# Patient Record
Sex: Female | Born: 1950 | Race: White | Hispanic: No | Smoking: Never smoker
Health system: Southern US, Community
[De-identification: ages and names within clinical notes are randomized; demographics above are authoritative.]

## PROBLEM LIST (undated history)

## (undated) DIAGNOSIS — F32A Depression, unspecified: Secondary | ICD-10-CM

## (undated) DIAGNOSIS — Z87442 Personal history of urinary calculi: Secondary | ICD-10-CM

## (undated) DIAGNOSIS — E669 Obesity, unspecified: Secondary | ICD-10-CM

## (undated) DIAGNOSIS — C801 Malignant (primary) neoplasm, unspecified: Secondary | ICD-10-CM

## (undated) DIAGNOSIS — I272 Pulmonary hypertension, unspecified: Secondary | ICD-10-CM

## (undated) DIAGNOSIS — Z8719 Personal history of other diseases of the digestive system: Secondary | ICD-10-CM

## (undated) DIAGNOSIS — I10 Essential (primary) hypertension: Secondary | ICD-10-CM

## (undated) DIAGNOSIS — I499 Cardiac arrhythmia, unspecified: Secondary | ICD-10-CM

## (undated) DIAGNOSIS — K219 Gastro-esophageal reflux disease without esophagitis: Secondary | ICD-10-CM

## (undated) DIAGNOSIS — M5416 Radiculopathy, lumbar region: Secondary | ICD-10-CM

## (undated) DIAGNOSIS — G4733 Obstructive sleep apnea (adult) (pediatric): Secondary | ICD-10-CM

## (undated) DIAGNOSIS — D126 Benign neoplasm of colon, unspecified: Secondary | ICD-10-CM

## (undated) DIAGNOSIS — R011 Cardiac murmur, unspecified: Secondary | ICD-10-CM

## (undated) DIAGNOSIS — H9191 Unspecified hearing loss, right ear: Secondary | ICD-10-CM

## (undated) DIAGNOSIS — R0600 Dyspnea, unspecified: Secondary | ICD-10-CM

## (undated) DIAGNOSIS — I82409 Acute embolism and thrombosis of unspecified deep veins of unspecified lower extremity: Secondary | ICD-10-CM

## (undated) DIAGNOSIS — Z86010 Personal history of colonic polyps: Secondary | ICD-10-CM

## (undated) DIAGNOSIS — F419 Anxiety disorder, unspecified: Secondary | ICD-10-CM

## (undated) DIAGNOSIS — K648 Other hemorrhoids: Secondary | ICD-10-CM

## (undated) DIAGNOSIS — K5790 Diverticulosis of intestine, part unspecified, without perforation or abscess without bleeding: Secondary | ICD-10-CM

## (undated) DIAGNOSIS — K589 Irritable bowel syndrome without diarrhea: Secondary | ICD-10-CM

## (undated) DIAGNOSIS — F329 Major depressive disorder, single episode, unspecified: Secondary | ICD-10-CM

## (undated) DIAGNOSIS — M797 Fibromyalgia: Secondary | ICD-10-CM

## (undated) DIAGNOSIS — I5032 Chronic diastolic (congestive) heart failure: Secondary | ICD-10-CM

## (undated) DIAGNOSIS — M1990 Unspecified osteoarthritis, unspecified site: Secondary | ICD-10-CM

## (undated) DIAGNOSIS — M5030 Other cervical disc degeneration, unspecified cervical region: Secondary | ICD-10-CM

## (undated) DIAGNOSIS — R51 Headache: Secondary | ICD-10-CM

## (undated) DIAGNOSIS — R55 Syncope and collapse: Secondary | ICD-10-CM

## (undated) HISTORY — PX: ABDOMINAL HYSTERECTOMY: SHX81

## (undated) HISTORY — PX: JOINT REPLACEMENT: SHX530

## (undated) HISTORY — DX: Benign neoplasm of colon, unspecified: D12.6

## (undated) HISTORY — DX: Personal history of colonic polyps: Z86.010

## (undated) HISTORY — DX: Other cervical disc degeneration, unspecified cervical region: M50.30

## (undated) HISTORY — DX: Radiculopathy, lumbar region: M54.16

## (undated) HISTORY — DX: Gastro-esophageal reflux disease without esophagitis: K21.9

## (undated) HISTORY — PX: WRIST FRACTURE SURGERY: SHX121

## (undated) HISTORY — DX: Essential (primary) hypertension: I10

## (undated) HISTORY — DX: Irritable bowel syndrome, unspecified: K58.9

## (undated) HISTORY — DX: Obstructive sleep apnea (adult) (pediatric): G47.33

## (undated) HISTORY — DX: Fibromyalgia: M79.7

## (undated) HISTORY — PX: UPPER GASTROINTESTINAL ENDOSCOPY: SHX188

## (undated) HISTORY — DX: Unspecified osteoarthritis, unspecified site: M19.90

## (undated) HISTORY — DX: Obesity, unspecified: E66.9

## (undated) HISTORY — PX: COLONOSCOPY: SHX5424

## (undated) HISTORY — DX: Diverticulosis of intestine, part unspecified, without perforation or abscess without bleeding: K57.90

## (undated) HISTORY — DX: Depression, unspecified: F32.A

## (undated) HISTORY — PX: APPENDECTOMY: SHX54

## (undated) HISTORY — PX: BREAST EXCISIONAL BIOPSY: SUR124

## (undated) HISTORY — PX: EYE SURGERY: SHX253

## (undated) HISTORY — DX: Other hemorrhoids: K64.8

## (undated) HISTORY — PX: CHOLECYSTECTOMY: SHX55

## (undated) HISTORY — DX: Anxiety disorder, unspecified: F41.9

## (undated) HISTORY — DX: Syncope and collapse: R55

## (undated) HISTORY — DX: Major depressive disorder, single episode, unspecified: F32.9

## (undated) HISTORY — DX: Depression, unspecified: F41.9

---

## 1998-08-06 ENCOUNTER — Encounter: Payer: Self-pay | Admitting: Emergency Medicine

## 1998-08-06 ENCOUNTER — Inpatient Hospital Stay (HOSPITAL_COMMUNITY): Admission: EM | Admit: 1998-08-06 | Discharge: 1998-08-08 | Payer: Self-pay | Admitting: Emergency Medicine

## 1998-09-05 ENCOUNTER — Encounter: Payer: Self-pay | Admitting: Neurology

## 1998-09-05 ENCOUNTER — Ambulatory Visit (HOSPITAL_COMMUNITY): Admission: RE | Admit: 1998-09-05 | Discharge: 1998-09-05 | Payer: Self-pay | Admitting: Neurology

## 1998-11-01 ENCOUNTER — Encounter: Payer: Self-pay | Admitting: Neurology

## 1998-11-01 ENCOUNTER — Emergency Department (HOSPITAL_COMMUNITY): Admission: EM | Admit: 1998-11-01 | Discharge: 1998-11-01 | Payer: Self-pay | Admitting: Emergency Medicine

## 1998-11-03 ENCOUNTER — Encounter: Payer: Self-pay | Admitting: Neurology

## 1998-11-03 ENCOUNTER — Ambulatory Visit (HOSPITAL_COMMUNITY): Admission: RE | Admit: 1998-11-03 | Discharge: 1998-11-03 | Payer: Self-pay | Admitting: Neurology

## 1998-11-04 ENCOUNTER — Encounter: Payer: Self-pay | Admitting: Neurology

## 1998-11-04 ENCOUNTER — Ambulatory Visit (HOSPITAL_COMMUNITY): Admission: RE | Admit: 1998-11-04 | Discharge: 1998-11-04 | Payer: Self-pay | Admitting: Neurology

## 1998-12-21 ENCOUNTER — Ambulatory Visit (HOSPITAL_COMMUNITY): Admission: RE | Admit: 1998-12-21 | Discharge: 1998-12-21 | Payer: Self-pay

## 1999-04-29 ENCOUNTER — Other Ambulatory Visit: Admission: RE | Admit: 1999-04-29 | Discharge: 1999-04-29 | Payer: Self-pay | Admitting: Obstetrics and Gynecology

## 1999-10-25 ENCOUNTER — Encounter: Admission: RE | Admit: 1999-10-25 | Discharge: 1999-10-25 | Payer: Self-pay

## 2000-05-08 ENCOUNTER — Other Ambulatory Visit: Admission: RE | Admit: 2000-05-08 | Discharge: 2000-05-08 | Payer: Self-pay | Admitting: Obstetrics and Gynecology

## 2000-10-01 ENCOUNTER — Encounter: Admission: RE | Admit: 2000-10-01 | Discharge: 2000-10-01 | Payer: Self-pay

## 2001-05-14 ENCOUNTER — Other Ambulatory Visit: Admission: RE | Admit: 2001-05-14 | Discharge: 2001-05-14 | Payer: Self-pay | Admitting: Obstetrics and Gynecology

## 2001-10-30 ENCOUNTER — Encounter: Admission: RE | Admit: 2001-10-30 | Discharge: 2001-10-30 | Payer: Self-pay | Admitting: Obstetrics and Gynecology

## 2001-10-30 ENCOUNTER — Encounter: Payer: Self-pay | Admitting: Obstetrics and Gynecology

## 2002-02-18 ENCOUNTER — Encounter: Payer: Self-pay | Admitting: Surgery

## 2002-02-18 ENCOUNTER — Encounter: Admission: RE | Admit: 2002-02-18 | Discharge: 2002-02-18 | Payer: Self-pay | Admitting: Surgery

## 2002-05-27 ENCOUNTER — Encounter: Payer: Self-pay | Admitting: Internal Medicine

## 2002-10-03 ENCOUNTER — Encounter: Payer: Self-pay | Admitting: Surgery

## 2002-10-03 ENCOUNTER — Encounter: Admission: RE | Admit: 2002-10-03 | Discharge: 2002-10-03 | Payer: Self-pay | Admitting: Surgery

## 2003-03-03 ENCOUNTER — Encounter: Admission: RE | Admit: 2003-03-03 | Discharge: 2003-03-03 | Payer: Self-pay | Admitting: Surgery

## 2003-03-03 ENCOUNTER — Encounter: Payer: Self-pay | Admitting: Surgery

## 2003-06-01 ENCOUNTER — Emergency Department (HOSPITAL_COMMUNITY): Admission: EM | Admit: 2003-06-01 | Discharge: 2003-06-01 | Payer: Self-pay | Admitting: Emergency Medicine

## 2003-06-01 ENCOUNTER — Encounter: Payer: Self-pay | Admitting: Emergency Medicine

## 2003-07-29 ENCOUNTER — Encounter: Admission: RE | Admit: 2003-07-29 | Discharge: 2003-07-29 | Payer: Self-pay | Admitting: Obstetrics and Gynecology

## 2003-10-06 ENCOUNTER — Encounter: Admission: RE | Admit: 2003-10-06 | Discharge: 2003-10-06 | Payer: Self-pay | Admitting: Surgery

## 2004-08-04 ENCOUNTER — Encounter: Admission: RE | Admit: 2004-08-04 | Discharge: 2004-08-04 | Payer: Self-pay | Admitting: Orthopedic Surgery

## 2004-08-09 ENCOUNTER — Ambulatory Visit (HOSPITAL_COMMUNITY): Admission: RE | Admit: 2004-08-09 | Discharge: 2004-08-09 | Payer: Self-pay | Admitting: Orthopedic Surgery

## 2004-08-23 ENCOUNTER — Ambulatory Visit: Payer: Self-pay | Admitting: Internal Medicine

## 2004-09-02 ENCOUNTER — Observation Stay (HOSPITAL_COMMUNITY): Admission: AD | Admit: 2004-09-02 | Discharge: 2004-09-03 | Payer: Self-pay | Admitting: Orthopedic Surgery

## 2004-09-05 ENCOUNTER — Ambulatory Visit: Payer: Self-pay | Admitting: Internal Medicine

## 2004-09-09 ENCOUNTER — Inpatient Hospital Stay (HOSPITAL_COMMUNITY): Admission: EM | Admit: 2004-09-09 | Discharge: 2004-09-12 | Payer: Self-pay | Admitting: Emergency Medicine

## 2004-09-09 ENCOUNTER — Ambulatory Visit: Payer: Self-pay | Admitting: Internal Medicine

## 2004-09-15 ENCOUNTER — Ambulatory Visit: Payer: Self-pay | Admitting: Internal Medicine

## 2004-09-16 ENCOUNTER — Ambulatory Visit (HOSPITAL_COMMUNITY): Admission: RE | Admit: 2004-09-16 | Discharge: 2004-09-16 | Payer: Self-pay | Admitting: Orthopedic Surgery

## 2004-09-22 ENCOUNTER — Ambulatory Visit: Payer: Self-pay | Admitting: Internal Medicine

## 2004-10-04 ENCOUNTER — Ambulatory Visit: Payer: Self-pay | Admitting: Internal Medicine

## 2004-10-18 ENCOUNTER — Ambulatory Visit: Payer: Self-pay | Admitting: Internal Medicine

## 2004-10-30 ENCOUNTER — Encounter: Admission: RE | Admit: 2004-10-30 | Discharge: 2004-10-30 | Payer: Self-pay | Admitting: Orthopedic Surgery

## 2004-11-01 ENCOUNTER — Ambulatory Visit: Payer: Self-pay | Admitting: Internal Medicine

## 2004-11-22 ENCOUNTER — Ambulatory Visit: Payer: Self-pay | Admitting: Internal Medicine

## 2004-12-13 ENCOUNTER — Ambulatory Visit: Payer: Self-pay | Admitting: Internal Medicine

## 2004-12-27 ENCOUNTER — Ambulatory Visit: Payer: Self-pay | Admitting: Internal Medicine

## 2005-01-17 ENCOUNTER — Ambulatory Visit: Payer: Self-pay | Admitting: Internal Medicine

## 2005-01-24 ENCOUNTER — Other Ambulatory Visit: Admission: RE | Admit: 2005-01-24 | Discharge: 2005-01-24 | Payer: Self-pay | Admitting: Obstetrics and Gynecology

## 2005-02-03 ENCOUNTER — Encounter: Admission: RE | Admit: 2005-02-03 | Discharge: 2005-02-03 | Payer: Self-pay | Admitting: Surgery

## 2005-02-07 ENCOUNTER — Ambulatory Visit: Payer: Self-pay | Admitting: Internal Medicine

## 2005-02-14 ENCOUNTER — Encounter: Admission: RE | Admit: 2005-02-14 | Discharge: 2005-02-14 | Payer: Self-pay | Admitting: Surgery

## 2005-05-18 ENCOUNTER — Ambulatory Visit (HOSPITAL_COMMUNITY): Admission: RE | Admit: 2005-05-18 | Discharge: 2005-05-18 | Payer: Self-pay | Admitting: Obstetrics and Gynecology

## 2005-07-03 ENCOUNTER — Emergency Department (HOSPITAL_COMMUNITY): Admission: EM | Admit: 2005-07-03 | Discharge: 2005-07-03 | Payer: Self-pay | Admitting: Family Medicine

## 2005-11-14 ENCOUNTER — Ambulatory Visit (HOSPITAL_COMMUNITY): Admission: RE | Admit: 2005-11-14 | Discharge: 2005-11-14 | Payer: Self-pay | Admitting: Orthopedic Surgery

## 2005-11-26 ENCOUNTER — Emergency Department (HOSPITAL_COMMUNITY): Admission: EM | Admit: 2005-11-26 | Discharge: 2005-11-27 | Payer: Self-pay | Admitting: Emergency Medicine

## 2005-11-27 ENCOUNTER — Ambulatory Visit (HOSPITAL_COMMUNITY): Admission: RE | Admit: 2005-11-27 | Discharge: 2005-11-27 | Payer: Self-pay | Admitting: Emergency Medicine

## 2006-01-29 ENCOUNTER — Ambulatory Visit: Payer: Self-pay | Admitting: Internal Medicine

## 2006-02-23 ENCOUNTER — Encounter: Admission: RE | Admit: 2006-02-23 | Discharge: 2006-02-23 | Payer: Self-pay | Admitting: Surgery

## 2006-04-19 ENCOUNTER — Ambulatory Visit: Payer: Self-pay | Admitting: Internal Medicine

## 2006-07-23 ENCOUNTER — Ambulatory Visit: Payer: Self-pay | Admitting: Internal Medicine

## 2006-07-23 LAB — CONVERTED CEMR LAB: Hgb A1c MFr Bld: 5.3 % (ref 4.6–6.0)

## 2006-09-18 HISTORY — PX: KNEE ARTHROSCOPY: SUR90

## 2006-10-24 ENCOUNTER — Emergency Department (HOSPITAL_COMMUNITY): Admission: EM | Admit: 2006-10-24 | Discharge: 2006-10-24 | Payer: Self-pay | Admitting: Emergency Medicine

## 2006-10-31 ENCOUNTER — Ambulatory Visit: Payer: Self-pay | Admitting: Internal Medicine

## 2006-11-13 ENCOUNTER — Ambulatory Visit: Payer: Self-pay | Admitting: Internal Medicine

## 2007-03-04 ENCOUNTER — Ambulatory Visit: Payer: Self-pay | Admitting: Internal Medicine

## 2007-03-14 ENCOUNTER — Ambulatory Visit: Payer: Self-pay | Admitting: Internal Medicine

## 2007-04-09 ENCOUNTER — Encounter: Admission: RE | Admit: 2007-04-09 | Discharge: 2007-04-09 | Payer: Self-pay | Admitting: Obstetrics and Gynecology

## 2007-04-09 ENCOUNTER — Encounter: Payer: Self-pay | Admitting: Internal Medicine

## 2007-04-29 ENCOUNTER — Telehealth (INDEPENDENT_AMBULATORY_CARE_PROVIDER_SITE_OTHER): Payer: Self-pay | Admitting: *Deleted

## 2007-04-30 ENCOUNTER — Ambulatory Visit: Payer: Self-pay | Admitting: Internal Medicine

## 2007-04-30 ENCOUNTER — Telehealth: Payer: Self-pay | Admitting: Internal Medicine

## 2007-07-23 ENCOUNTER — Ambulatory Visit: Payer: Self-pay | Admitting: Internal Medicine

## 2007-07-23 DIAGNOSIS — K589 Irritable bowel syndrome without diarrhea: Secondary | ICD-10-CM | POA: Insufficient documentation

## 2007-07-23 DIAGNOSIS — I80299 Phlebitis and thrombophlebitis of other deep vessels of unspecified lower extremity: Secondary | ICD-10-CM | POA: Insufficient documentation

## 2007-07-23 DIAGNOSIS — IMO0001 Reserved for inherently not codable concepts without codable children: Secondary | ICD-10-CM

## 2007-07-23 DIAGNOSIS — M797 Fibromyalgia: Secondary | ICD-10-CM | POA: Insufficient documentation

## 2007-07-23 DIAGNOSIS — J328 Other chronic sinusitis: Secondary | ICD-10-CM | POA: Insufficient documentation

## 2007-07-30 ENCOUNTER — Telehealth (INDEPENDENT_AMBULATORY_CARE_PROVIDER_SITE_OTHER): Payer: Self-pay | Admitting: *Deleted

## 2007-07-31 ENCOUNTER — Telehealth (INDEPENDENT_AMBULATORY_CARE_PROVIDER_SITE_OTHER): Payer: Self-pay | Admitting: *Deleted

## 2007-08-20 ENCOUNTER — Ambulatory Visit: Payer: Self-pay | Admitting: Cardiology

## 2007-09-02 ENCOUNTER — Ambulatory Visit: Payer: Self-pay

## 2007-09-02 ENCOUNTER — Encounter: Payer: Self-pay | Admitting: Cardiology

## 2007-10-15 ENCOUNTER — Ambulatory Visit: Payer: Self-pay | Admitting: Internal Medicine

## 2007-10-15 DIAGNOSIS — IMO0002 Reserved for concepts with insufficient information to code with codable children: Secondary | ICD-10-CM | POA: Insufficient documentation

## 2007-10-15 DIAGNOSIS — R5383 Other fatigue: Secondary | ICD-10-CM

## 2007-10-15 DIAGNOSIS — I10 Essential (primary) hypertension: Secondary | ICD-10-CM | POA: Insufficient documentation

## 2007-10-15 DIAGNOSIS — R5381 Other malaise: Secondary | ICD-10-CM | POA: Insufficient documentation

## 2007-10-19 LAB — CONVERTED CEMR LAB
Basophils Absolute: 0 10*3/uL (ref 0.0–0.1)
Basophils Relative: 0.2 % (ref 0.0–1.0)
Eosinophils Absolute: 0.1 10*3/uL (ref 0.0–0.6)
Eosinophils Relative: 3 % (ref 0.0–5.0)
Free T4: 0.6 ng/dL (ref 0.6–1.6)
HCT: 39.7 % (ref 36.0–46.0)
Hemoglobin: 13.3 g/dL (ref 12.0–15.0)
Lymphocytes Relative: 31.5 % (ref 12.0–46.0)
MCHC: 33.5 g/dL (ref 30.0–36.0)
MCV: 86.8 fL (ref 78.0–100.0)
Monocytes Absolute: 0.4 10*3/uL (ref 0.2–0.7)
Monocytes Relative: 7.9 % (ref 3.0–11.0)
Neutro Abs: 2.9 10*3/uL (ref 1.4–7.7)
Neutrophils Relative %: 57.4 % (ref 43.0–77.0)
Platelets: 252 10*3/uL (ref 150–400)
RBC: 4.57 M/uL (ref 3.87–5.11)
RDW: 13.3 % (ref 11.5–14.6)
TSH: 1.28 microintl units/mL (ref 0.35–5.50)
WBC: 5 10*3/uL (ref 4.5–10.5)

## 2007-10-21 ENCOUNTER — Encounter (INDEPENDENT_AMBULATORY_CARE_PROVIDER_SITE_OTHER): Payer: Self-pay | Admitting: *Deleted

## 2007-10-24 ENCOUNTER — Encounter: Payer: Self-pay | Admitting: Internal Medicine

## 2007-12-10 ENCOUNTER — Telehealth (INDEPENDENT_AMBULATORY_CARE_PROVIDER_SITE_OTHER): Payer: Self-pay | Admitting: *Deleted

## 2007-12-12 ENCOUNTER — Ambulatory Visit: Payer: Self-pay | Admitting: Internal Medicine

## 2007-12-13 ENCOUNTER — Ambulatory Visit: Payer: Self-pay | Admitting: Internal Medicine

## 2007-12-13 ENCOUNTER — Telehealth (INDEPENDENT_AMBULATORY_CARE_PROVIDER_SITE_OTHER): Payer: Self-pay | Admitting: *Deleted

## 2007-12-13 LAB — CONVERTED CEMR LAB: Potassium: 3.6 meq/L (ref 3.5–5.1)

## 2007-12-16 ENCOUNTER — Encounter (INDEPENDENT_AMBULATORY_CARE_PROVIDER_SITE_OTHER): Payer: Self-pay | Admitting: *Deleted

## 2007-12-16 LAB — CONVERTED CEMR LAB
BUN: 12 mg/dL (ref 6–23)
Creatinine, Ser: 0.8 mg/dL (ref 0.4–1.2)
Potassium: 8.9 meq/L (ref 3.5–5.1)

## 2008-04-13 ENCOUNTER — Encounter: Admission: RE | Admit: 2008-04-13 | Discharge: 2008-04-13 | Payer: Self-pay | Admitting: Surgery

## 2008-04-20 ENCOUNTER — Emergency Department (HOSPITAL_COMMUNITY): Admission: EM | Admit: 2008-04-20 | Discharge: 2008-04-21 | Payer: Self-pay | Admitting: Emergency Medicine

## 2008-04-21 ENCOUNTER — Telehealth: Payer: Self-pay | Admitting: Internal Medicine

## 2008-04-21 DIAGNOSIS — R519 Headache, unspecified: Secondary | ICD-10-CM | POA: Insufficient documentation

## 2008-04-21 DIAGNOSIS — R51 Headache: Secondary | ICD-10-CM | POA: Insufficient documentation

## 2008-06-11 ENCOUNTER — Encounter: Payer: Self-pay | Admitting: Internal Medicine

## 2008-06-12 ENCOUNTER — Encounter: Payer: Self-pay | Admitting: Internal Medicine

## 2008-07-16 ENCOUNTER — Ambulatory Visit: Payer: Self-pay | Admitting: Internal Medicine

## 2008-07-27 ENCOUNTER — Telehealth (INDEPENDENT_AMBULATORY_CARE_PROVIDER_SITE_OTHER): Payer: Self-pay | Admitting: *Deleted

## 2008-08-12 ENCOUNTER — Encounter: Payer: Self-pay | Admitting: Internal Medicine

## 2008-12-28 DIAGNOSIS — I38 Endocarditis, valve unspecified: Secondary | ICD-10-CM | POA: Insufficient documentation

## 2009-02-24 ENCOUNTER — Telehealth (INDEPENDENT_AMBULATORY_CARE_PROVIDER_SITE_OTHER): Payer: Self-pay | Admitting: *Deleted

## 2009-02-24 ENCOUNTER — Encounter: Payer: Self-pay | Admitting: Internal Medicine

## 2009-02-25 ENCOUNTER — Ambulatory Visit: Payer: Self-pay | Admitting: Internal Medicine

## 2009-02-25 DIAGNOSIS — K219 Gastro-esophageal reflux disease without esophagitis: Secondary | ICD-10-CM | POA: Insufficient documentation

## 2009-03-01 ENCOUNTER — Telehealth (INDEPENDENT_AMBULATORY_CARE_PROVIDER_SITE_OTHER): Payer: Self-pay | Admitting: *Deleted

## 2009-03-03 ENCOUNTER — Ambulatory Visit: Payer: Self-pay | Admitting: Internal Medicine

## 2009-03-03 LAB — CONVERTED CEMR LAB: H Pylori IgG: NEGATIVE

## 2009-03-05 ENCOUNTER — Encounter (INDEPENDENT_AMBULATORY_CARE_PROVIDER_SITE_OTHER): Payer: Self-pay | Admitting: *Deleted

## 2009-03-05 LAB — CONVERTED CEMR LAB
Basophils Absolute: 0 10*3/uL (ref 0.0–0.1)
Basophils Relative: 0 % (ref 0.0–3.0)
Eosinophils Absolute: 0.2 10*3/uL (ref 0.0–0.7)
Eosinophils Relative: 3.8 % (ref 0.0–5.0)
H Pylori IgG: NEGATIVE
HCT: 40.6 % (ref 36.0–46.0)
Hemoglobin: 14 g/dL (ref 12.0–15.0)
Lymphocytes Relative: 28.3 % (ref 12.0–46.0)
Lymphs Abs: 1.3 10*3/uL (ref 0.7–4.0)
MCHC: 34.5 g/dL (ref 30.0–36.0)
MCV: 90.3 fL (ref 78.0–100.0)
Monocytes Absolute: 0.2 10*3/uL (ref 0.1–1.0)
Monocytes Relative: 4.6 % (ref 3.0–12.0)
Neutro Abs: 3 10*3/uL (ref 1.4–7.7)
Neutrophils Relative %: 63.3 % (ref 43.0–77.0)
Platelets: 198 10*3/uL (ref 150.0–400.0)
RBC: 4.49 M/uL (ref 3.87–5.11)
RDW: 13.3 % (ref 11.5–14.6)
TSH: 0.64 microintl units/mL (ref 0.35–5.50)
WBC: 4.7 10*3/uL (ref 4.5–10.5)

## 2009-03-29 ENCOUNTER — Ambulatory Visit: Payer: Self-pay | Admitting: Internal Medicine

## 2009-03-29 ENCOUNTER — Encounter (INDEPENDENT_AMBULATORY_CARE_PROVIDER_SITE_OTHER): Payer: Self-pay | Admitting: *Deleted

## 2009-03-29 LAB — CONVERTED CEMR LAB
OCCULT 1: NEGATIVE
OCCULT 2: NEGATIVE
OCCULT 3: NEGATIVE

## 2009-04-05 ENCOUNTER — Ambulatory Visit: Payer: Self-pay | Admitting: Internal Medicine

## 2009-04-14 ENCOUNTER — Encounter: Admission: RE | Admit: 2009-04-14 | Discharge: 2009-04-14 | Payer: Self-pay | Admitting: Obstetrics and Gynecology

## 2009-04-21 ENCOUNTER — Ambulatory Visit: Payer: Self-pay | Admitting: Internal Medicine

## 2009-04-21 ENCOUNTER — Encounter: Payer: Self-pay | Admitting: Internal Medicine

## 2009-04-24 ENCOUNTER — Encounter: Payer: Self-pay | Admitting: Internal Medicine

## 2009-04-29 ENCOUNTER — Telehealth: Payer: Self-pay | Admitting: Internal Medicine

## 2009-05-03 ENCOUNTER — Telehealth (INDEPENDENT_AMBULATORY_CARE_PROVIDER_SITE_OTHER): Payer: Self-pay | Admitting: *Deleted

## 2009-05-04 ENCOUNTER — Telehealth (INDEPENDENT_AMBULATORY_CARE_PROVIDER_SITE_OTHER): Payer: Self-pay | Admitting: *Deleted

## 2009-05-14 ENCOUNTER — Encounter: Payer: Self-pay | Admitting: Internal Medicine

## 2009-07-21 ENCOUNTER — Ambulatory Visit: Payer: Self-pay | Admitting: Internal Medicine

## 2009-07-21 DIAGNOSIS — Z9283 Personal history of failed moderate sedation: Secondary | ICD-10-CM | POA: Insufficient documentation

## 2009-07-21 DIAGNOSIS — N949 Unspecified condition associated with female genital organs and menstrual cycle: Secondary | ICD-10-CM | POA: Insufficient documentation

## 2009-11-26 ENCOUNTER — Emergency Department (HOSPITAL_COMMUNITY): Admission: EM | Admit: 2009-11-26 | Discharge: 2009-11-26 | Payer: Self-pay | Admitting: Emergency Medicine

## 2010-01-24 ENCOUNTER — Ambulatory Visit: Payer: Self-pay | Admitting: Internal Medicine

## 2010-01-24 DIAGNOSIS — M8430XA Stress fracture, unspecified site, initial encounter for fracture: Secondary | ICD-10-CM | POA: Insufficient documentation

## 2010-01-24 DIAGNOSIS — M1610 Unilateral primary osteoarthritis, unspecified hip: Secondary | ICD-10-CM | POA: Insufficient documentation

## 2010-01-26 ENCOUNTER — Ambulatory Visit: Payer: Self-pay | Admitting: Family Medicine

## 2010-01-26 ENCOUNTER — Encounter: Payer: Self-pay | Admitting: Internal Medicine

## 2010-02-16 HISTORY — PX: TOTAL HIP ARTHROPLASTY: SHX124

## 2010-02-21 ENCOUNTER — Inpatient Hospital Stay (HOSPITAL_COMMUNITY): Admission: RE | Admit: 2010-02-21 | Discharge: 2010-02-24 | Payer: Self-pay | Admitting: Orthopedic Surgery

## 2010-05-05 ENCOUNTER — Encounter: Admission: RE | Admit: 2010-05-05 | Discharge: 2010-05-05 | Payer: Self-pay | Admitting: Internal Medicine

## 2010-10-07 ENCOUNTER — Encounter: Payer: Self-pay | Admitting: Internal Medicine

## 2010-10-20 NOTE — Letter (Signed)
Summary: Patient [facility] Biopsy Results  [facility]  [address] [place], [place] [address]   Phone: [phone]  Fax: [phone]        April 24, 2009 MRN: [id number]    Kathy [name] [address] [place], [place]  [address]    Dear Ms. Hashimi,  I am pleased to inform you that the biopsies taken during your recent endoscopic examination did not show any evidence serious problems. There was mild gastritis which is comman and not likely to be causing any problems.  Please continue your current treatment, give the omeprazole 2-3 months before we say it did not help your hoarseness and suspected GERD.  Please call [phone] to schedule a return visit to review      your condition. You will need an appointment in October.  Please call us if you are having persistent problems or have questions about your condition that have not been fully answered at this time.   Sincerely,  Iva Boop MD  This letter has been electronically signed by your physician.

## 2010-10-20 NOTE — Letter (Signed)
Summary: Results Follow up Letter  [facility]  [address] [place], [place] [address]   Phone: [phone]  Fax: [phone]    03/05/2009 MRN: [id number]  Kathy [name] [address] [place], [place]  [address]  Dear [name],  The following are the results of your recent test(s):  Test         Result    Pap Smear:        Normal _____  Not Normal _____ Comments: ______________________________________________________ Cholesterol: LDL(Bad cholesterol):         Your goal is less than:         HDL (Good cholesterol):       Your goal is more than: Comments:  ______________________________________________________ Mammogram:        Normal _____  Not Normal _____ Comments:  ___________________________________________________________________ Hemoccult:        Normal _____  Not normal _______ Comments:    _____________________________________________________________________ Other Tests: PLEASE SEE ATTACHED LABS DONE ON 02/25/2009    We routinely do not discuss normal results over the telephone.  If you desire a copy of the results, or you have any questions about this information we can discuss them at your next office visit.   Sincerely,

## 2010-10-20 NOTE — Progress Notes (Signed)
Summary: med ? hctz and sulfa allergy  hop see please  Phone Note Call from Patient   Caller: Patient Reason for Call: Talk to Nurse Summary of Call: dr. Alwyn Ren [phone] pt was given hydrochlorothiazide and was looking online at the medication. and found out that if you have allergies to sulfah drugs you shouldnt take this medication. she just wanted to know if its okay to take the medication. she hasnt gotten it filled Initial call taken by: Kathy [name],  April 30, 2007 2:30 PM  Follow-up for Phone Call        hopp , do you want to change the medication Follow-up by: Kathy [name],  April 30, 2007 5:14 PM  Additional Follow-up for Phone Call Additional follow up Details #1::        increase Toprol to 100 mg  qd ( can use up present  50 mg pills 2 qd, then fill new Rx); do not fill HCTZ Additional Follow-up by: Marga Melnick MD,  April 30, 2007 5:36 PM    Additional Follow-up for Phone Call Additional follow up Details #2::    called patient and left instructions on how to take her toprol and not to fill hctz Follow-up by: Kathy [name],  April 30, 2007 5:51 PM  New/Updated Medications: * TOPROL XL 100  MG  TB24 (METOPROLOL SUCCINATE) 1 by mouth qd   Prescriptions: TOPROL XL 100  MG  TB24 (METOPROLOL SUCCINATE) 1 by mouth qd  #90 x 1   Entered by:   Kathy [name]   Authorized by:   Marga Melnick MD   Signed by:   Kathy [name] on 04/30/2007   Method used:   Print then Give to Patient   RxID:   [id number] TOPROL XL 100  MG  TB24 (METOPROLOL SUCCINATE) 1 by mouth qd  #90 x 1   Entered by:   Marga Melnick MD   Authorized by:   Kathy [name]   Signed by:   Marga Melnick MD on 04/30/2007   Method used:   Print then Give to Patient   RxID:   [id number]

## 2010-10-20 NOTE — Miscellaneous (Signed)
Summary: BONE DENSITY  Clinical Lists Changes  Orders: Added new Test order of T-Bone Densitometry (77080) - Signed Added new Test order of T-Lumbar Vertebral Assessment (77082) - Signed 

## 2010-10-20 NOTE — Progress Notes (Signed)
Summary: HOP---RX  Phone Note Refill Request   Refills Requested: Medication #1:  TOPROL XL 100 MG  TB24 take one tablet daily [facility] [address]--[phone]  Initial call taken by: Freddy Jaksch,  May 03, 2009 4:27 PM    Prescriptions: TOPROL XL 100 MG  TB24 (METOPROLOL SUCCINATE) take one tablet daily  #90 Tablet x 2   Entered by:   Kandice Hams   Authorized by:   Marga Melnick MD   Signed by:   Kandice Hams on 05/04/2009   Method used:   Faxed to ...       [facility]. [phone]* (retail)       [phone] [address].       [place], [place]  [address]       Ph: [phone]       Fax: [phone]   RxID:   [id number]

## 2010-10-20 NOTE — Progress Notes (Signed)
Summary: nother refill cough syrup//hop  Phone Note Call from Patient Call back at Home Phone [phone] Call back at CELL [phone]   Caller: Patient Summary of Call: WAS SEEN A WEEK AGO 07/16/08 WAS GIVEN ABX AND COUGH SYRUP, PT SAYS SOME BETTER BUT COUGH KEEPS ME UP AT NIGHT, WAS WONDERING CAN GET ANOTHER RX FOR THE COUGH SYRUP, PROMETHAZINE Initial call taken by: Kandice Hams,  July 27, 2008 4:02 PM  Follow-up for Phone Call        promethazine with codeine 120 cc, 1 tsp q 6 hrs as needed  Follow-up by: Marga Melnick MD,  July 27, 2008 6:18 PM  Additional Follow-up for Phone Call Additional follow up Details #1::        Spoke with pt informed rx faxed to [facility] .Kandice Hams  July 28, 2008 9:58 AM  Additional Follow-up by: Kandice Hams,  July 28, 2008 9:58 AM      Prescriptions: PROMETHAZINE-CODEINE 6.25-10 MG/5ML SYRP (PROMETHAZINE-CODEINE) 1 tsp q 6 hrs as needed cough  #120 cc x 0   Entered by:   Kandice Hams   Authorized by:   Marga Melnick MD   Signed by:   Kandice Hams on 07/28/2008   Method used:   Printed then faxed to ...       [facility]. [phone]* (retail)       [phone] [address].       [place], [place]  [address]       Ph: [phone]       Fax: [phone]   RxID:   [id number]

## 2010-10-20 NOTE — Progress Notes (Signed)
Summary: PHONE  Phone Note Call from Patient   Caller: Patient Summary of Call: patient saw dr Onalee Hua l shoemaker and he stated that patient acid reflux was acting reoccurring  he wants to know if she should see a gastro doctor or dr hopper.contact [phone], [phone] Initial call taken by: Barb Merino,  February 24, 2009 4:10 PM  Follow-up for Phone Call        PATIENT TO BE SEEN TOMORROW @ 8AM Follow-up by: Shonna Chock,  February 24, 2009 4:55 PM

## 2010-10-20 NOTE — Progress Notes (Signed)
Summary: hopper-refill nexium 40mg   Phone Note Refill Request   Refills Requested: Medication #1:  NEXIUM 40 MG  CPDR 1 by mouth qd recd by fax [facility] [phone] [address] [phone] last filled #30 on 10.05.08  Initial call taken by: Gwen Pounds,  July 30, 2007 10:32 AM  Follow-up for Phone Call        Rx completed in Dr. Tiajuana Amass Follow-up by: Wendall Stade,  July 30, 2007 12:56 PM      Prescriptions: NEXIUM 40 MG  CPDR (ESOMEPRAZOLE MAGNESIUM) 1 by mouth qd  #30 x 2   Entered by:   Wendall Stade   Authorized by:   Marga Melnick MD   Signed by:   Wendall Stade on 07/30/2007   Method used:   Electronically sent to ...       [facility]. #[id number]*       [address]       [place], [place]  [address]       Ph: [phone]       Fax: [phone]   RxID:   [id number]

## 2010-10-20 NOTE — Consult Note (Signed)
Summary: [facility]  [facility]   Imported By: Lanelle Bal 07/01/2008 11:57:22  _____________________________________________________________________  External Attachment:    Type:   Image     Comment:   External Document

## 2010-10-20 NOTE — Assessment & Plan Note (Signed)
Summary: knot on the neck//alj   Vital Signs:  Patient Profile:   60 Years Old Female Weight:      281.25 pounds Temp:     97.8 degrees F oral Pulse rate:   60 / minute Pulse rhythm:   regular Resp:     18 per minute BP sitting:   130 / 82 Cuff size:   large  Pt. in pain?   no  Vitals Entered By: Wendall Stade (October 15, 2007 9:17 AM)                  Chief Complaint:  lump on neck and Back pain.  History of Present Illness: 1.  lump on neck found when  her mother was dying at the first of the year.  Mother passed away from cancer of the neck and bone cancer.  patient is very anxious. 2.  lower right groin pain and down right leg and hurts when waling, ongoing for a while 3.  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  Back pain for 20 years; groin & leg pain X 1 yr.Groin pain sudden in  onset w/o trigger & intermittent.It can go into R thigh. Back pain is constant. PMH fibromyalgia.  The patient reports weakness and rest pain, but denies fever, chills, loss of sensation, fecal incontinence, urinary incontinence, urinary retention, dysuria, inability to work, and inability to care for self.  The pain is located in the right low back and right thoracic back.  The pain is made worse by standing or walking and activity.  The pain is made better by inactivity, NSAID medications, and muscle relaxants.  Risk factors for serious underlying conditions include duration of pain > 1 month and age >= 50 years.    Current Allergies (reviewed today): ! * ZANAFLEX ! PCN ! SEPTRA ! * ACROMYCIN  Past Medical History:    Reviewed history from 07/23/2007 and no changes required:       prior Redux Rx; prev followed by Dr Lucas Mallow       Hypertension       Diverticulosis, colon       fibromyalgia       irritable bowel syndrome       phlebitis and thrombophlebitis  Past Surgical  History:    Reviewed history from 07/23/2007 and no changes required:       Hysterectomy       Oophorectomy- 1 ovary       Appendectomy       Tubal ligation       Cholecystectomy       Lumpectomy   Family History:    Reviewed history from 07/23/2007 and no changes required:       M has squamous cell CA from throat; also CA in  hip    Review of Systems  Derm      Denies changes in color of skin, changes in nail beds, dryness, excessive perspiration, flushing, hair loss, itching, lesion(s), poor wound healing, and rash.  Endo      Complains of heat intolerance.      Denies cold intolerance, excessive hunger, excessive thirst, excessive urination, polyuria, and weight change.  Heme      See HPI      Complains of enlarge lymph nodes.      Denies abnormal bruising, bleeding, fevers,  pallor, and skin discoloration.   Physical Exam  General:     Well-developed,well-nourished,in no acute distress; alert,appropriate and cooperative throughout examination;overweight-appearing.   Neck:     See cervical LA; thyroid w/o nodules Abdomen:     Bowel sounds positive,abdomen soft and non-tender without masses, organomegaly or hernias noted. Extremities:     No clubbing, cyanosis, edema, or deformity noted with normal full range of motion of all joints. Pain with ROM R hip. Negative SLR .   Neurologic:     alert & oriented X3, strength normal in all extremities, gait (heel Quentin Mulling) normal, and DTRs symmetrical and1/2+ Skin:     Intact without suspicious lesions or rashes Cervical Nodes:     Firm 1X1 cm LN vs lipoma over cervical osteophyte Axillary Nodes:     No palpable lymphadenopathy Psych:     memory intact for recent and remote, normally interactive, good eye contact, not anxious appearing, and not depressed appearing.      Impression & Recommendations:  Problem # 1:  LYMPH NODE-ENLARGED (ICD-785.6) vs lipoma over cervical osteophyte Orders: TLB-CBC Platelet - w/Differential  (85025-CBCD) ENT Referral (ENT)   Problem # 2:  INGUINAL PAIN, RIGHT (ICD-789.09) hip origin vs L2 radiculopathy  Her updated medication list for this problem includes:    Flexeril 10 Mg Tabs (Cyclobenzaprine hcl)    [employer]ber[employer]ons Ec Aspirin 325 Mg Tbec (Aspirin) .Marland Kitchen... 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 1 by mouth qd  Orders: Rheumatology Referral (Rheumatology)   Problem # 4:  FIBROMYALGIA (ICD-729.1)  Her updated medication list for this problem includes:    Flexeril 10 Mg Tabs (Cyclobenzaprine hcl)    [employer]ons Ec Aspirin 325 Mg Tbec (Aspirin) .Marland Kitchen... 1 by mouth qd   Problem # 5:  FATIGUE (ICD-780.79)  Orders: TLB-CBC Platelet - w/Differential (85025-CBCD) TLB-TSH (Thyroid Stimulating Hormone) (84443-TSH) TLB-T4 (Thyrox), Free [phone])   Complete Medication List: 1)  Alprazolam 1 Mg Tabs (Alprazolam) .Marland Kitchen.. 1 by mouth tid 2)  Nexium 40 Mg Cpdr (Esomeprazole magnesium) .Marland Kitchen.. 1 by mouth qd 3)  Toprol Xl 100 Mg Tb24 (Metoprolol succinate) .... Take one tablet daily 4)  Flexeril 10 Mg Tabs (Cyclobenzaprine hcl) 5)  Prozac 40 Mg Caps (Fluoxetine hcl) .Marland Kitchen.. 1 by mouth qd 6)  [employer]tsons Ec Aspirin 325 Mg Tbec (Aspirin) .Marland Kitchen.. 1 by mouth qd 7)  Claritin 10 Mg Tabs (Loratadine) .Marland Kitchen.. 1 once daily as needed   Patient Instructions: 1)  Consult appts will be scheduled .    ]

## 2010-10-20 NOTE — Assessment & Plan Note (Signed)
Summary: acute lost voice,congested.cbs   Vital Signs:  Patient Profile:   60 Years Old Female Weight:      281.8 pounds Temp:     98.5 degrees F oral Pulse rate:   80 / minute Resp:     17 per minute BP sitting:   128 / 88  (left arm) Cuff size:   large  Vitals Entered By: Shonna Chock (July 16, 2008 5:07 PM)             Comments Patient has Rx for Plavix and Zocor (per Dr.Sethi) patient hasnt started yet (? dose).Shonna Chock  July 16, 2008 5:03 PM      Chief Complaint:  Lost Voice Saturday, productive cough-discolored, and scratchy throat.  History of Present Illness: Onset as head congestion 10/23; ST 07/11/08 ; laryngitis 10/25. Exposed to 7 month old grandson 10/21 7 10/22; he had "flu" & was treated with Tamiflu. Rx: Benadryl.    Current Allergies (reviewed today): ! * ZANAFLEX ! PCN ! SEPTRA ! * ACROMYCIN     Review of Systems  General      Complains of sweats.      Denies chills and fever.  Eyes      Denies discharge, eye pain, and red eye.  ENT      Complains of nasal congestion.      Denies earache and sinus pressure.      frontal ha ; no facial pain; purulence   Resp      Complains of cough and sputum productive.   Physical Exam  General:     in no acute distress; alert,appropriate and cooperative throughout examination Eyes:     No corneal or conjunctival inflammation noted. Perrla.  Ears:     External ear exam shows no significant lesions or deformities.  Otoscopic examination reveals clear canals, tympanic membranes are intact bilaterally without bulging, retraction, inflammation or discharge. Hearing is grossly normal bilaterally. Nose:     External nasal examination shows no deformity or inflammation. Nasal mucosa are erythematous without lesions or exudates. Mouth:     Oral mucosa and oropharynx without lesions or exudates.  Teeth in good repair.Laryngitis Lungs:     Normal respiratory effort, chest expands symmetrically.  Lungs are clear to auscultation, no crackles or wheezes. Cervical Nodes:     No lymphadenopathy noted Axillary Nodes:     No palpable lymphadenopathy    Impression & Recommendations:  Problem # 1:  SINUSITIS- ACUTE-NOS (ICD-461.9)  Her updated medication list for this problem includes:    Clarithromycin 500 Mg Xr24h-tab (Clarithromycin) .Marland Kitchen... 2 once daily with food    Promethazine-codeine 6.25-10 Mg/52ml Syrp (Promethazine-codeine) .Marland Kitchen... 1 tsp q 6 hrs as needed cough   Problem # 2:  BRONCHITIS-ACUTE (ICD-466.0)  Her updated medication list for this problem includes:    Clarithromycin 500 Mg Xr24h-tab (Clarithromycin) .Marland Kitchen... 2 once daily with food    Promethazine-codeine 6.25-10 Mg/35ml Syrp (Promethazine-codeine) .Marland Kitchen... 1 tsp q 6 hrs as needed cough   Complete Medication List: 1)  Alprazolam 1 Mg Tabs (Alprazolam) .Marland Kitchen.. 1 by mouth tid 2)  Nexium 40 Mg Cpdr (Esomeprazole magnesium) .Marland Kitchen.. 1 by mouth qd 3)  Toprol Xl 100 Mg Tb24 (Metoprolol succinate) .... Take one tablet daily 4)  Flexeril 10 Mg Tabs (Cyclobenzaprine hcl) 5)  Prozac 40 Mg Caps (Fluoxetine hcl) .Marland Kitchen.. 1 by mouth qd 6)  [employer]ons Ec Aspirin 325 Mg Tbec (Aspirin) .Marland Kitchen.. 1 by mouth qd 7)  Claritin 10 Mg Tabs (Loratadine) .Marland KitchenMarland KitchenMarland Kitchen 1  once daily as needed 8)  Benicar 40 Mg Tabs (Olmesartan medoxomil) .Marland Kitchen.. 1 qd 9)  Motrin 800mg   .... 1tab prn 10)  Clarithromycin 500 Mg Xr24h-tab (Clarithromycin) .... 2 once daily with food 11)  Promethazine-codeine 6.25-10 Mg/79ml Syrp (Promethazine-codeine) .Marland Kitchen.. 1 tsp q 6 hrs as needed cough   Patient Instructions: 1)  Drink as much fluid as you can tolerate for the next few days. Neti pot once daily as needed    Prescriptions: PROMETHAZINE-CODEINE 6.25-10 MG/5ML SYRP (PROMETHAZINE-CODEINE) 1 tsp q 6 hrs as needed cough  #120 cc x 0   Entered and Authorized by:   Marga Melnick MD   Signed by:   Marga Melnick MD on 07/16/2008   Method used:   Print then Give to Patient   RxID:    [id number] CLARITHROMYCIN 500 MG XR24H-TAB (CLARITHROMYCIN) 2 once daily with food  #20 x 0   Entered and Authorized by:   Marga Melnick MD   Signed by:   Marga Melnick MD on 07/16/2008   Method used:   Print then Give to Patient   RxID:   [id number]  ]

## 2010-10-20 NOTE — Consult Note (Signed)
Summary: [facility]   Imported By: Freddy Jaksch 12/31/2007 16:44:57  _____________________________________________________________________  External Attachment:    Type:   Image     Comment:   External Document

## 2010-10-20 NOTE — Assessment & Plan Note (Signed)
Summary: GERD, HOARSENESS   History of Present Illness Visit Type: consult Primary GI MD: Stan Head MD [facility] Primary Provider: Marga Melnick, MD Requesting Provider: Marga Melnick, MD Chief Complaint: acid reflux, hoarseness and sore throat History of Present Illness:   Kathy [name] saw Dr. Annalee Genta about hoarseness, sore throat and problems singing. Has had problems for about a year. Mom was dying from cancer and was involved with that and it may be longer. Suggested reflux could be playing a role, larynx ok but surrounding tissues ok. Nexium changed to Zegerid recently.  Has had trouble getting two times a day PPI. Kathy [name] thinks Zegerid is not working well. Kathy [name] is awakening at night and eating antacids since, and nausea is an issue. Kathy [name] says when on Nexium two times a day that Kathy [name] had no problems at all, and that was likely in 2008.  Hemorrhoids bleeding at times also.   GI Review of Systems    Reports abdominal pain, acid reflux, and  heartburn.     Location of  Abdominal pain: lower abdomen.    Denies belching, bloating, chest pain, dysphagia with liquids, dysphagia with solids, loss of appetite, nausea, vomiting, vomiting blood, weight loss, and  weight gain.      Reports diverticulosis, hemorrhoids, and  rectal bleeding.     Denies anal fissure, black tarry stools, change in bowel habit, constipation, diarrhea, fecal incontinence, heme positive stool, irritable bowel syndrome, jaundice, light color stool, liver problems, and  rectal pain.    Current Medications (verified): 1)  Alprazolam 1 Mg  Tabs (Alprazolam) .Marland Kitchen.. 1 By Mouth Tid 2)  Toprol Xl 100 Mg  Tb24 (Metoprolol Succinate) .... Take One Tablet Daily 3)  Flexeril 10 Mg  Tabs (Cyclobenzaprine Hcl) .... 2 By Mouth Once Daily As Needed 4)  Prozac 40 Mg  Caps (Fluoxetine Hcl) .Marland Kitchen.. 1 By Mouth Qd 5)  Albertsons Ec Aspirin 325 Mg  Tbec (Aspirin) .Marland Kitchen.. 1 By Mouth Qd 6)  Claritin 10 Mg  Tabs (Loratadine) .Marland Kitchen.. 1 Once Daily As Needed 7)   Benicar 40 Mg  Tabs (Olmesartan Medoxomil) .Marland Kitchen.. 1 Qd 8)  Motrin 800mg  .... 1tab Prn 9)  Calcium 1000mg  .... 1 By Mouth Once Daily 10)  Multivitamins  Tabs (Multiple Vitamin) .... Centrum Silver: 1 By Mouth Once Daily 11)  Zegerid 40-1100 Mg Caps (Omeprazole-Sodium Bicarbonate) .Marland Kitchen.. 1 Each Am  Allergies (verified): 1)  ! * Zanaflex 2)  ! Pcn 3)  ! Septra 4)  ! * Acromycin  Past History:  Past Medical History: prior Redux Rx; prev followed by Dr Lucas Mallow Hypertension Diverticulosis, colon fibromyalgia irritable bowel syndrome phlebitis and thrombophlebitis anxiety/depression  Past Surgical History: Reviewed history from 02/25/2009 and no changes required. Hysterectomy Oophorectomy- 1 ovary Appendectomy Tubal ligation Cholecystectomy Lumpectomy Colonoscopy : Tics  Family History: M has squamous cell CA from throat; also CA in  hip Siblings: 2 klilled by drunk drivers No FH of Colon Cancer: Family History of Heart Disease: Mother  Family History of Breast Cancer: Mother and paternal aunt   Social History: Married  Futures trader 2 boys Tobacco Use - No.  Alcohol Use - yes -- socially Regular Exercise - no Drug Use - no  Review of Systems       The patient complains of allergy/sinus, anxiety-new, arthritis/joint pain, back pain, cough, depression-new, fatigue, night sweats, and voice change.         All other ROS negative except as per HPI.   Vital Signs:  Patient profile:   60  year old female Height:      66 inches Weight:      285 pounds BMI:     46.17 BSA:     2.33 Pulse rate:   77 / minute Pulse rhythm:   regular BP sitting:   128 / 80  (right arm) Cuff size:   large  Physical Exam  General:  obese.   Eyes:  PERRLA, no icterus. Mouth:  No deformity or lesions, dentition normal. Neck:  Supple; no masses or thyromegaly. Lungs:  Clear throughout to auscultation. Heart:  Regular rate and rhythm; no murmurs, rubs,  or bruits. Abdomen:  Soft,  and  nondistended. Mild epigastric tenderness. No masses, hepatosplenomegaly or hernias noted. Normal bowel sounds.obese.   Extremities:  trace pedal edema and into pre-tibial Neurologic:  Alert and  oriented x4;  grossly normal neurologically. Cervical Nodes:  No significant cervical or supraclavicular adenopathy.  Psych:  Alert and cooperative. Normal mood and affect.   Impression & Recommendations:  Problem # 1:  ESOPHAGEAL REFLUX (ICD-530.81) Assessment Comment Only Has classic and atypical symptoms. Given chronicity and clinical situation, will evaluate with endoscopy. Educated re: diet and nature of disease also. Weight loss would help this, too.  Zegerid was not helpful and will change to omeprazole 40 mg two times a day due to formulary coverage. (Nexium helped but coverage problems). Risks, benefits,and indications of endoscopic procedure(s) were reviewed with the patient and all questions answered.  Orders: EGD (EGD)  Problem # 2:  HOARSENESS, SORE THROAT, ? FROM GERD (ZOX-096.04) Assessment: Comment Only Certainly could be from GERD. Needs longer PPI Tx at two times a day. ? if also sinusitis, nasal problems could play a role. Zegerid was not helpful and will change to omeprazole 40 mg two times a day due to formulary coverage. (Nexium helped but coverage problems).  Problem # 3:  OBESITY (ICD-278.00) discussed need to lose weight, Kathy [name] has discussedwith Dr. Alwyn Ren reinforced his instructions no white foods, avoid high-fructose corn syrup  Problem # 4:  RECTAL BLEEDING (ICD-569.3) Assessment: Comment Only known hemorrhoids at 2003 colonoscopy will review with her again at EGD  Patient Instructions: 1)  Copy sent to : Marga Melnick, MD, Osborn Coho, MD 2)  [facility] Patient Information Guide given to patient.  3)  GI Reflux brochure given.  4)  Avoid foods high in acid content ( tomatoes, citrus juices, spicy foods) . Avoid eating within 3 to 4 hours of  lying down or before exercising. Do not over eat; try smaller more frequent meals. Elevate head of bed four inches when sleeping.  5)  The medication list was reviewed and reconciled.  All changed / newly prescribed medications were explained.  A complete medication list was provided to the patient / caregiver. Prescriptions: OMEPRAZOLE 40 MG CPDR (OMEPRAZOLE) Take 1 capsule by mouth two times a day  #60 x 11   Entered by:   Francee Piccolo CMA (AAMA)   Authorized by:   Iva Boop MD   Signed by:   Francee Piccolo CMA (AAMA) on 04/05/2009   Method used:   Electronically to        [facility]. [phone]* (retail)       [phone] [address]       [place], [place]  [address]       Ph: [phone]       Fax: [phone]   RxID:   [id number]  Appended Document: GERD, HOARSENESS rectal bleeding has been intermittent, bright red and almost exclusively on toilet paper it is associated with frequent stools and responds to wet wipes and Preparation H

## 2010-10-20 NOTE — Assessment & Plan Note (Signed)
Summary: med refill/kdc   Vital Signs:  Patient profile:   60 year old female Height:      66.5 inches Weight:      263 pounds Temp:     98.8 degrees F oral Pulse rate:   72 / minute Resp:     16 per minute BP sitting:   134 / 86  (left arm) Cuff size:   large  Vitals Entered By: Shonna Chock (Jan 24, 2010 2:29 PM) CC: Yearly/Left Hip Surgical Clearance, Lower Extremity Joint pain, Pre-op Evaluation Comments REVIEWED MED LIST, PATIENT AGREED DOSE AND INSTRUCTION CORRECT    Primary Care Provider:  Marga Melnick, MD  CC:  Yearly/Left Hip Surgical Clearance, Lower Extremity Joint pain, and Pre-op Evaluation.  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   The pain began approx 05/2009 & has progressed. The patient reports giving away, popping, decreased ROM, and weakness, but denies swelling, redness, locking, and stiffness for >1 hr.  There was no   injury prior to onset of pain.  The pain is described as sharp, burning, constant, and occuring at rest.  The patient denies the following symptoms: fever, rash, photosensitivity, eye symptoms, diarrhea, and dysuria.    The patient denies respiratory symptoms, GI bleeding, chest pain, edema, and PND.  Patient has no history of mild angina, diabetes, renal insufficiency, and uncontrolled HTN. BP @ home 138-140/ 67-70.  There is no history of antiplatelet agents other ASA, chronic steroids, warfarin, diabetes meds, antianginal meds, and bleeding disorder.  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 Lovenox coverage peri operatively is planned.  Allergies: 1)  ! * Zanaflex 2)  ! Pcn 3)  ! Septra 4)  ! * Acromycin  Past History:  Past Medical History: prior Redux Rx; previously she was  followed by Dr Lucas Mallow Hypertension Diverticulosis, colon fibromyalgia irritable bowel syndrome phlebitis and  thrombophlebitis, PMH of post knee arthroscopy 2008 anxiety/depression GERD/gastrits; PMH of dysplasia w/o melanoma , Dr Danella Deis Colonic polyps, hx of, Dr Leone Payor  Past Surgical History: Hysterectomy & Oophorectomy for dysfunctional menses Appendectomy Tubal ligation Cholecystectomy for cyst Right breast Lumpectomy Colonoscopy : Tics Right knee  arthroscopy 2008 Colon polypectomy Endoscopy : gastritis  Review of Systems General:  Denies chills, fatigue, and sweats; Weight down with decreased carbs , increased fruits & vegetables. ENT:  Denies difficulty swallowing and hoarseness. CV:  Denies chest pain or discomfort, leg cramps with exertion, palpitations, and shortness of breath with exertion. Resp:  Denies cough, excessive snoring, hypersomnolence, morning headaches, and sputum productive; No apnea. GI:  Denies abdominal pain, bloody stools, and dark tarry stools; No dyphagia. GU:  Denies discharge, dysuria, and hematuria. Derm:  Denies lesion(s) and rash. Neuro:  Denies brief paralysis, numbness, tingling, and weakness. Psych:  Denies anxiety, depression, easily angered, easily tearful, and irritability. Endo:  Denies cold intolerance, excessive hunger, excessive thirst, excessive urination, and heat intolerance. Heme:  Denies abnormal bruising and bleeding. Allergy:  Complains of itching eyes, persistent infections, and sneezing; well controlled @ this point.  Physical Exam  General:  well-nourished; alert,appropriate and cooperative throughout examination Neck:  No deformities, masses, or tenderness noted. Lungs:  Normal respiratory effort, chest expands symmetrically. Lungs are clear to auscultation, no crackles or wheezes. Heart:  Normal rate and regular rhythm. S1 and S2 normal without gallop, murmur, click, rub or other extra sounds.  Abdomen:  Bowel sounds positive,abdomen soft and non-tender without masses, organomegaly or hernias noted. Umbilical ring Msk:  No deformity  or scoliosis noted of thoracic or lumbar spine.   Pulses:  R and L carotid,radial,dorsalis pedis and posterior tibial pulses are full and equal bilaterally Extremities:  No clubbing, cyanosis. trace left pedal edema.  Homan's negative  Neurologic:  alert & oriented X3 ; biceps  DTRs symmetrical and normal.   Skin:  Intact without suspicious lesions or rashes Cervical Nodes:  No lymphadenopathy noted Axillary Nodes:  No palpable lymphadenopathy Psych:  memory intact for recent and remote, normally interactive, good eye contact, and not anxious appearing.     Impression & Recommendations:  Problem # 1:  ARTHRITIS, HIP (ICD-716.95)  Problem # 2:  STRESS FRACTURE (ICD-733.95) L hip  Problem # 3:  HYPERTENSION (ICD-401.9)  controlled Her updated medication list for this problem includes:    Toprol Xl 100 Mg Tb24 (Metoprolol succinate) .Marland Kitchen... Take one tablet daily    Benicar 40 Mg Tabs (Olmesartan medoxomil) .Marland Kitchen... 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 1 by mouth qd  Complete Medication List: 1)  Alprazolam 1 Mg Tabs (Alprazolam) .Marland Kitchen.. 1 by mouth tid 2)  Toprol Xl 100 Mg Tb24 (Metoprolol succinate) .... Take one tablet daily 3)  Flexeril 10 Mg Tabs (Cyclobenzaprine hcl) .... 2 by mouth once daily as needed 4)  Prozac 40 Mg Caps (Fluoxetine hcl) .Marland Kitchen.. 1 by mouth qd 5)  Albertsons Ec Aspirin 325 Mg Tbec (Aspirin) .Marland Kitchen.. 1 by mouth qd 6)  Benicar 40 Mg Tabs (Olmesartan medoxomil) .Marland Kitchen.. 1 by mouth once daily , **appointment due** 7)  Ibuprofen 800 Mg Tabs (Ibuprofen) .... One tablet by mouth once daily 8)  Calcium 1000mg   .... 1 by mouth once daily 9)  Multivitamins Tabs (Multiple vitamin) .... Centrum silver: 1 by mouth once daily 10)   Omeprazole 40 Mg Cpdr (Omeprazole) .... Take 1 capsule by mouth two times a day  Patient Instructions: 1)  You are cleared for hip surgery with peri operative anticoagulation as per Dr Despina Hick

## 2010-10-20 NOTE — Letter (Signed)
Summary: [facility]  [facility]   Imported By: Lanelle Bal 09/17/2008 12:14:42  _____________________________________________________________________  External Attachment:    Type:   Image     Comment:   External Document

## 2010-10-20 NOTE — Letter (Signed)
Summary: Results Follow up Letter  [place] [facility] [place]  [address] [place], [place] [address]   Phone: [phone]  Fax: [phone]    10/21/2007 MRN: [id number]  KATHE WIRICK [address] [place], [place]  [address]  Dear Ms. Geerdes,  The following are the results of your recent test(s):  Test         Result    Pap Smear:        Normal _____  Not Normal _____ Comments: ______________________________________________________ Cholesterol: LDL(Bad cholesterol):         Your goal is less than:         HDL (Good cholesterol):       Your goal is more than: Comments:  ______________________________________________________ Mammogram:        Normal _____  Not Normal _____ Comments:  ___________________________________________________________________ Hemoccult:        Normal _____  Not normal _______ Comments:    _____________________________________________________________________ Other Tests:  Please see attached results and comments   We routinely do not discuss normal results over the telephone.  If you desire a copy of the results, or you have any questions about this information we can discuss them at your next office visit.   Sincerely,

## 2010-10-20 NOTE — Medication Information (Signed)
Summary: Approved - Omeprazole / [employer]  Approved - Omeprazole / [employer]   Imported By: Lennie Odor 05/27/2009 14:26:00  _____________________________________________________________________  External Attachment:    Type:   Image     Comment:   External Document

## 2010-10-20 NOTE — Progress Notes (Signed)
Summary: ?'s  Phone Note Call from Patient   Caller: Patient Reason for Call: Acute Illness, Talk to Nurse Summary of Call: dr. Alwyn Ren [phone] [facility] would like to speak with nurse concering her bp. her bp up and she has had two different medications for this. yesterday her bp was 231/86, 186/80. and wanted to know what should she do. i offered her an apt for thursday but she wants to speak with nurse about this problem. she also has a headache and gernerl doesnt feel well. Initial call taken by: Charolette Child,  April 29, 2007 11:33 AM  Follow-up for Phone Call        OV SCHED TOMORROW Follow-up by: Kandice Hams,  April 29, 2007 12:23 PM

## 2010-10-20 NOTE — Progress Notes (Signed)
Summary: **Lab Error**  Phone Note Outgoing Call Call back at Home Phone [phone]   Call placed by: Shonna Chock,  March 01, 2009 2:17 PM Call placed to: Patient Summary of Call: Left message on machine for patient to return call when avaliable, Reason for call:   Spoke with Clydie Braun from [place] lab-H.pylori test was not done/processed in error on there behalf and would like for [name] to contact patient and inform her and have her go to either location to have labs drawn-no charge venipuncture./[facility]  March 01, 2009 2:19 PM   Follow-up for Phone Call        SPOKE WITH PATIENT, PATIENT TOOK INFORMATION VERY WELL AND WILL GO TO [place] TOMORROW TO HAVE H.PYLORI TEST DONE Follow-up by: Shonna Chock,  March 02, 2009 11:23 AM

## 2010-10-20 NOTE — Letter (Signed)
Summary: Results Follow up Letter  [place] [facility] [place]  [address] [place], [place] [address]   Phone: [phone]  Fax: [phone]    03/29/2009 MRN: [id number]  Kathy [name] [address] [place], [place]  [address]  Dear [name],  The following are the results of your recent test(s):  Test         Result    Pap Smear:        Normal _____  Not Normal _____ Comments: ______________________________________________________ Cholesterol: LDL(Bad cholesterol):         Your goal is less than:         HDL (Good cholesterol):       Your goal is more than: Comments:  ______________________________________________________ Mammogram:        Normal _____  Not Normal _____ Comments:  ___________________________________________________________________ Hemoccult:        Normal __X___  Not normal _______ Comments:    _____________________________________________________________________ Other Tests:    We routinely do not discuss normal results over the telephone.  If you desire a copy of the results, or you have any questions about this information we can discuss them at your next office visit.   Sincerely,

## 2010-10-20 NOTE — Progress Notes (Signed)
Summary: called patient with results of second potassium  Phone Note Outgoing Call   Call placed by: kathy bixby,cma Call placed to: Patient Summary of Call: called patient and let her know the repeat potassium was in the normal range.....................................................................Marland KitchenWendall Stade  December 13, 2007 4:18 PM

## 2010-10-20 NOTE — Progress Notes (Signed)
Summary: REFILL REQUEST-NEXIUM  Phone Note Refill Request   Refills Requested: Medication #1:  NEXIUM 40 MG  CPDR 1 by mouth qd REFILL REQUEST VIA FAX FROM [facility]  Initial call taken by: Daine Gip,  July 31, 2007 4:41 PM  Follow-up for Phone Call        Rx completed in Dr. Tiajuana Amass Follow-up by: Wendall Stade,  August 01, 2007 7:50 AM      Prescriptions: NEXIUM 40 MG  CPDR (ESOMEPRAZOLE MAGNESIUM) 1 by mouth qd  #30 x 2   Entered by:   Doristine Devoid   Authorized by:   Marga Melnick MD   Signed by:   Doristine Devoid on 08/02/2007   Method used:   Electronically sent to ...       [facility]. #[id number]*       [address]       [place], [place]  [address]       Ph: [phone]       Fax: [phone]   RxID:   [id number]

## 2010-10-20 NOTE — Progress Notes (Signed)
Summary: RX--HOP  Phone Note Refill Request   Refills Requested: Medication #1:  TOPROL XL 100 MG  TB24 take one tablet daily [facility]--[phone] FX--[phone]  Initial call taken by: Freddy Jaksch,  May 04, 2009 4:31 PM  Follow-up for Phone Call        done Follow-up by: Kandice Hams,  May 05, 2009 2:36 PM

## 2010-10-20 NOTE — Letter (Signed)
Summary: Results Follow up Letter  [place] [facility]  [address] [place], [place] [address]   Phone: [phone]  Fax: [phone]    12/16/2007 MRN: [id number]  LAQUANNA VEAZEY [address] [place], [place]  [address]  Dear Ms. Silverio,  The following are the results of your recent test(s):  Test         Result    Pap Smear:        Normal _____  Not Normal _____ Comments: ______________________________________________________ Cholesterol: LDL(Bad cholesterol):         Your goal is less than:         HDL (Good cholesterol):       Your goal is more than: Comments:  ______________________________________________________ Mammogram:        Normal _____  Not Normal _____ Comments:  ___________________________________________________________________ Hemoccult:        Normal _____  Not normal _______ Comments:    _____________________________________________________________________ Other Tests:  Please see attached results and comments   We routinely do not discuss normal results over the telephone.  If you desire a copy of the results, or you have any questions about this information we can discuss them at your next office visit.   Sincerely,

## 2010-10-20 NOTE — Letter (Signed)
Summary: [facility]  [facility]   Imported By: Lanelle Bal 03/04/2009 10:40:58  _____________________________________________________________________  External Attachment:    Type:   Image     Comment:   External Document

## 2010-10-20 NOTE — Procedures (Signed)
Summary: Colonoscopy: Divertiulosis, Hemorrhoids   Colonoscopy  Procedure date:  05/27/2002  Findings:      Results: Hemorrhoids.     Results: Diverticulosis.       Location:  [facility].    Procedures Next Due Date:    Colonoscopy: 05/2012  Patient Name: [name], [name] MRN:  Procedure Procedures: Colonoscopy CPT: [phone].    with biopsy. CPT: [id number].  Personnel: Endoscopist: Iva Boop, MD, [facility].  Referred By: Titus Dubin. Alwyn Ren, MD.  Exam Location: Exam performed in Outpatient Clinic. Outpatient  Patient Consent: Procedure, Alternatives, Risks and Benefits discussed, consent obtained, from patient. Consent was obtained by the RN.  Indications Symptoms: Abdominal pain / bloating.  Average Risk Screening Routine.  History  Pre-Exam Physical: Performed May 27, 2002. Cardio-pulmonary exam, Rectal exam, HEENT exam , Abdominal exam, Mental status exam WNL.  Exam Exam: Extent of exam reached: Cecum, extent intended: Cecum.  The cecum was identified by appendiceal orifice and IC valve. Patient position: on left side. Colon retroflexion performed. Images taken. ASA Classification: I. Tolerance: excellent.  Monitoring: Pulse and BP monitoring, Oximetry used. Supplemental O2 given.  Colon Prep Used Visicol for colon prep. Prep results: excellent.  Sedation Meds: Patient assessed and found to be appropriate for moderate (conscious) sedation. Fentanyl 150 mcg. given IV. Versed 10 mg. given IV.  Findings - NORMAL EXAM: Cecum to Descending Colon.  - DIVERTICULOSIS: Descending Colon to Sigmoid Colon. Not bleeding. ICD9: Diverticulosis: 562.10. Comments: Severe. Subepithelial hemorrhage noted and biopsied.  HEMORRHOIDS: External. Size: Grade I. Not bleeding. Not thrombosed. ICD9: Hemorrhoids, External: 455.3.   Assessment Abnormal examination, see findings above.  Diagnoses: 562.10: Diverticulosis.  455.3: Hemorrhoids, External.    Events  Unplanned Interventions: No intervention was required.  Plans Medication Plan: Fiber supplements: QD, starting May 27, 2002   Patient Education: Patient given standard instructions for: Diverticulosis.  Disposition: After procedure patient sent to recovery. After recovery patient sent home.  Scheduling/Referral: Await pathology to schedule patient.  Comments: Should have screening colonoscopy in ten years. Would start yearly hemoccults in 5 years.  CC:   Marga Melnick, MD  This report was created from the original endoscopy report, which was reviewed and signed by the above listed endoscopist.

## 2010-10-20 NOTE — Assessment & Plan Note (Signed)
Summary: not doing well with med//tl   Vital Signs:  Patient Profile:   60 Years Old Female Weight:      268.13 pounds Temp:     98.4 degrees F oral Pulse rate:   60 / minute Pulse rhythm:   regular BP sitting:   140 / 92  (left arm) Cuff size:   large  Pt. in pain?   no  Vitals Entered By: Wendall Stade (March 14, 2007 11:36 AM)                Chief Complaint:  medication reaction.  History of Present Illness: started lisinopril last week and has had a hacky cough also having sinus drainage but the cough is bad enough that occasionally she vomits.  she did not feel well yesterday.  Took the lisinopril this am.  Current Allergies (reviewed today): ! * ZANAFLEX ! PCN ! SEPTRA ! * ACROMYCIN Updated/Current Medications (including changes made in today's visit):  ALPRAZOLAM 1 MG  TABS (ALPRAZOLAM) 1 by mouth tid NEXIUM 40 MG  CPDR (ESOMEPRAZOLE MAGNESIUM) 1 by mouth qd TOPROL XL 50 MG  TB24 (METOPROLOL SUCCINATE) 1 by mouth qd FLEXERIL 10 MG  TABS (CYCLOBENZAPRINE HCL)  PROZAC 40 MG  CAPS (FLUOXETINE HCL) 1 by mouth qd ALBERTSONS EC ASPIRIN 325 MG  TBEC (ASPIRIN) 1 by mouth qd MICARDIS 80 MG  TABS (TELMISARTAN) 1 once daily CLARITIN 10 MG  TABS (LORATADINE) 1 once daily as needed      Review of Systems  General      Complains of sweats.      Denies chills, fever, and weight loss.  Eyes      Denies discharge, eye irritation, eye pain, red eye, and vision loss-1 eye.  ENT      Complains of postnasal drainage.      Denies earache, nasal congestion, and sinus pressure.      no facial pain;purulence  Resp      See HPI      Complains of cough and sputum productive.      clear sputum  Allergy      Complains of itching eyes, seasonal allergies, and sneezing.      as needed Benadryl   Physical Exam  General:     Well-developed,well-nourished,in no acute distress; alert,appropriate and cooperative throughout examination Eyes:     pupils equal, pupils  round, pupils reactive to light, pupils react to accomodation, and no injection. EOM WNL  Ears:     L TM minimally erythematous Nose:     External nasal examination shows no deformity or inflammation. Nasal mucosa are pink and moist without lesions or exudates. Mouth:     Oral mucosa and oropharynx without lesions or exudates.  Teeth in good repair. Lungs:     Normal respiratory effort, chest expands symmetrically. Lungs are clear to auscultation, no crackles or wheezes.Dry cough Cervical Nodes:     No lymphadenopathy noted    Impression & Recommendations:  Problem # 1:  COUGH (ICD-786.2)  Problem # 2:  HYPERTENSION, ESSENTIAL (ICD-401.9)  The following medications were removed from the medication list:    Lisinopril 20 Mg Tabs (Lisinopril) .Marland Kitchen... 1 once daily  Her updated medication list for this problem includes:    Toprol Xl 50 Mg Tb24 (Metoprolol succinate) .Marland Kitchen... 1 by mouth qd    Micardis 80 Mg Tabs (Telmisartan) .Marland Kitchen... 1 once daily   Medications Added to Medication List This Visit: 1)  Micardis 80 Mg Tabs (Telmisartan) .Marland KitchenMarland KitchenMarland Kitchen 1  once daily 2)  Claritin 10 Mg Tabs (Loratadine) .Marland Kitchen.. 1 once daily as needed   Patient Instructions: 1)  Use a Neti pot once daily as needed .BP gaol =<130/85    Prescriptions: CLARITIN 10 MG  TABS (LORATADINE) 1 once daily as needed  #90 x prn X 1 year   Entered and Authorized by:   Marga Melnick MD   Signed by:   Marga Melnick MD on 03/14/2007   Method used:   Print then Give to Patient   RxID:   [id number] MICARDIS 80 MG  TABS (TELMISARTAN) 1 once daily  #30 x 5   Entered and Authorized by:   Marga Melnick MD   Signed by:   Marga Melnick MD on 03/14/2007   Method used:   Print then Give to Patient   RxID:   [id number]

## 2010-10-20 NOTE — Assessment & Plan Note (Signed)
Summary: ACID REFLUX/SCM   Vital Signs:  Patient profile:   60 year old female Weight:      278.0 pounds Temp:     99.1 degrees F oral Pulse rate:   76 / minute Resp:     18 per minute BP sitting:   126 / 90  (left arm) Cuff size:   large  Vitals Entered By: Shonna Chock (February 25, 2009 1:35 PM) CC: COUGH,DRAINAGE, SEEN DR.SHOEMAKER YESTERDAY INFORMED PATIENT TO BE EVALUATED RE: ACID REFLUX Comments REVIEWED MED LIST, PATIENT AGREED DOSE AND INSTRUCTION CORRECT    CC:  COUGH, DRAINAGE, and SEEN DR.SHOEMAKER YESTERDAY INFORMED PATIENT TO BE EVALUATED RE: ACID REFLUX.  History of Present Illness: Direct larynyngoscopy 02/24/09 by Dr Annalee Genta revealed normal vocal cords, but  evidence  despite Nexium 40 mg once daily . Previously on PPI two times a day but insurance would not approve two times a day.   Colonoscopy "due"; no PMH of upper Endo. On PPI X  ? 5 years   Allergies: 1)  ! * Zanaflex 2)  ! Pcn 3)  ! Septra 4)  ! 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 GI:  Denies abdominal pain, bloody stools, dark tarry stools, indigestion, nausea, and vomiting; No dysphagia.  Physical Exam  General:  in no acute distress;  alert,appropriate and cooperative throughout examination Eyes:  No corneal or conjunctival inflammation noted. Perrla.  No icterus Mouth:  Oral mucosa and oropharynx without lesions or exudates.  Teeth in good repair. No pharyngeal erythema.   Hoarse Abdomen:  Bowel sounds positive,abdomen soft and non-tender without masses, organomegaly or hernias noted. Umbilical jewelry Skin:  Intact without suspicious lesions or rashes. No jaundice Cervical Nodes:  No lymphadenopathy noted Axillary Nodes:  No palpable lymphadenopathy   Impression & Recommendations:  Problem # 1:  ESOPHAGEAL REFLUX (ICD-530.81)  With + laryngoscopic esophageal changes The following medications were removed from the medication list:    Nexium 40 Mg Cpdr (Esomeprazole magnesium) .Marland Kitchen... 1 by mouth qd Her updated medication list for this problem includes:    Zegerid 40-1100 Mg Caps (Omeprazole-sodium bicarbonate) .Marland Kitchen... 1 each am  Orders: Venipuncture (16109) TLB-CBC Platelet - w/Differential (85025-CBCD) TLB-H. 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 1 by mouth tid 2)  Toprol Xl 100 Mg Tb24 (Metoprolol succinate) .... Take one tablet daily 3)  Flexeril 10 Mg Tabs (Cyclobenzaprine hcl) .... 2 by mouth once daily as needed 4)  Prozac 40 Mg Caps (Fluoxetine hcl) .Marland Kitchen.. 1 by mouth qd 5)  Alber[employer]ons Ec Aspirin 325 Mg Tbec (Aspirin) .Marland Kitchen.. 1 by mouth qd 6)  Claritin 10 Mg Tabs (Loratadine) .Marland Kitchen.. 1 once daily as needed 7)  Benicar 40 Mg Tabs (Olmesartan medoxomil) .Marland Kitchen.. 1 qd 8)  Motrin 800mg   .... 1tab prn 9)  Calcium 1000mg   .Marland KitchenMarland KitchenMarland Kitchen  1 by mouth once daily 10)  Multivitamins Tabs (Multiple vitamin) .... Centrum silver: 1 by mouth once daily 11)  Zegerid 40-1100 Mg Caps (Omeprazole-sodium bicarbonate)  .Marland Kitchen.. 1 each am  Patient Instructions: 1)  Avoid foods high in acid (tomatoes, citrus juices, spicy foods). Avoid eating within two hours of lying down or before exercising. Do not over eat; try smaller more frequent meals. Elevate head of bed twelve inches when sleeping. Complete stool cards Prescriptions: ZEGERID 40-1100 MG CAPS (OMEPRAZOLE-SODIUM BICARBONATE) 1 each am  #30 x 5   Entered and Authorized by:   Marga Melnick MD   Signed by:   Marga Melnick MD on 02/25/2009   Method used:   Print then Give to Patient   RxID:   [id number]

## 2010-10-20 NOTE — Assessment & Plan Note (Signed)
Summary: acute/elevated bp/alr   Vital Signs:  Patient Profile:   60 Years Old Female Weight:      276.6 pounds Temp:     97.9 degrees F oral Pulse rate:   74 / minute Resp:     17 per minute BP sitting:   160 / 100  (left arm)  Pt. in pain?   no  Vitals Entered By: Wendall Stade (December 12, 2007 1:34 PM)                  Chief Complaint:  elevated bp.  History of Present Illness: BP response noted with single dose of Benicar. Isolated episodes of loose stool, flushing w/o chest pain or headache.  Hypertension History:      She complains of headache and peripheral edema, but denies chest pain, palpitations, dyspnea with exertion, orthopnea, PND, visual symptoms, neurologic problems, syncope, and side effects from treatment.  She notes no problems with any antihypertensive medication side effects.  Further comments include: BP 162/93-205/130. Some headaches in band distribution , "sinus & migraines". Occa edema RLE.        Positive major cardiovascular risk factors include female age 36 years old or older and hypertension.       Prior Medication List:  ALPRAZOLAM 1 MG  TABS (ALPRAZOLAM) 1 by mouth tid NEXIUM 40 MG  CPDR (ESOMEPRAZOLE MAGNESIUM) 1 by mouth qd TOPROL XL 100 MG  TB24 (METOPROLOL SUCCINATE) take one tablet daily FLEXERIL 10 MG  TABS (CYCLOBENZAPRINE HCL)  PROZAC 40 MG  CAPS (FLUOXETINE HCL) 1 by mouth qd ALBERTSONS EC ASPIRIN 325 MG  TBEC (ASPIRIN) 1 by mouth qd CLARITIN 10 MG  TABS (LORATADINE) 1 once daily as needed BENICAR 40 MG  TABS (OLMESARTAN MEDOXOMIL) 1 qd   Updated Prior Medication List: ALPRAZOLAM 1 MG  TABS (ALPRAZOLAM) 1 by mouth tid NEXIUM 40 MG  CPDR (ESOMEPRAZOLE MAGNESIUM) 1 by mouth qd TOPROL XL 100 MG  TB24 (METOPROLOL SUCCINATE) take one tablet daily FLEXERIL 10 MG  TABS (CYCLOBENZAPRINE HCL)  PROZAC 40 MG  CAPS (FLUOXETINE HCL) 1 by mouth qd ALBERTSONS EC ASPIRIN 325 MG  TBEC (ASPIRIN) 1 by mouth qd CLARITIN 10 MG  TABS (LORATADINE)  1 once daily as needed BENICAR 40 MG  TABS (OLMESARTAN MEDOXOMIL) 1 qd * MOTRIN 800MG  1tab prn DARVOCET-N 50 50-325 MG  TABS (PROPOXYPHENE N-APAP) 1 tab prn  Current Allergies (reviewed today): ! * ZANAFLEX ! PCN ! SEPTRA ! * ACROMYCIN     Review of Systems  Eyes      Denies double vision, halos, and vision loss-both eyes.      Blurring only with handwork @ night  ENT      Complains of nasal congestion and sinus pressure.      Seasonal  CV      Denies bluish discoloration of lips or nails and leg cramps with exertion.  MS      Complains of joint pain.      Fractured coccyx  Neuro      Denies difficulty with concentration, disturbances in coordination, memory loss, numbness, poor balance, sensation of room spinning, and tingling.   Physical Exam  General:     Well-developed,well-nourished,in no acute distress; alert,appropriate and cooperative throughout examination;overweight-appearing.   Eyes:     No corneal or conjunctival inflammation noted.  Perrla. Funduscopic exam benign, without hemorrhages, exudates or papilledema. Minimal art narrowing Lungs:     Normal respiratory effort, chest expands symmetrically. Lungs are clear to auscultation,  no crackles or wheezes. Heart:     Normal rate and regular rhythm. S1 and S2 normal without gallop, murmur, click, rub. S4 with slurring. Abdomen:     Bowel sounds positive,abdomen soft and non-tender without masses, organomegaly or hernias noted.No bruits or AAA Pulses:     R and L carotid,radial,dorsalis pedis and posterior tibial pulses are full and equal bilaterally Psych:     Oriented X3, memory intact for recent and remote, normally interactive, good eye contact, not anxious appearing, and not depressed appearing.      Impression & Recommendations:  Problem # 1:  HYPERTENSION, ESSENTIAL NOS (ICD-401.9)  Her updated medication list for this problem includes:    Toprol Xl 100 Mg Tb24 (Metoprolol succinate) .Marland Kitchen...  Take one tablet daily    Benicar 40 Mg Tabs (Olmesartan medoxomil) .Marland Kitchen... 1 qd  Orders: TLB-BUN (Urea Nitrogen) (84520-BUN) TLB-Potassium (K+) (84132-K) TLB-Creatinine, Blood (82565-CREA)   Problem # 2:  FIBROMYALGIA (ICD-729.1)  Her updated medication list for this problem includes:    Flexeril 10 Mg Tabs (Cyclobenzaprine hcl)    Albertsons Ec Aspirin 325 Mg Tbec (Aspirin) .Marland Kitchen... 1 by mouth qd    Darvocet-n 50 50-325 Mg Tabs (Propoxyphene n-apap) .Marland Kitchen... 1 tab prn   Complete Medication List: 1)  Alprazolam 1 Mg Tabs (Alprazolam) .Marland Kitchen.. 1 by mouth tid 2)  Nexium 40 Mg Cpdr (Esomeprazole magnesium) .Marland Kitchen.. 1 by mouth qd 3)  Toprol Xl 100 Mg Tb24 (Metoprolol succinate) .... Take one tablet daily 4)  Flexeril 10 Mg Tabs (Cyclobenzaprine hcl) 5)  Prozac 40 Mg Caps (Fluoxetine hcl) .Marland Kitchen.. 1 by mouth qd 6)  Albertsons Ec Aspirin 325 Mg Tbec (Aspirin) .Marland Kitchen.. 1 by mouth qd 7)  Claritin 10 Mg Tabs (Loratadine) .Marland Kitchen.. 1 once daily as needed 8)  Benicar 40 Mg Tabs (Olmesartan medoxomil) .Marland Kitchen.. 1 qd 9)  Motrin 800mg   .... 1tab prn 10)  Darvocet-n 50 50-325 Mg Tabs (Propoxyphene n-apap) .Marland Kitchen.. 1 tab prn  Hypertension Assessment/Plan:      The patient's hypertensive risk group is category B: At least one risk factor (excluding diabetes) with no target organ damage.  Today's blood pressure is 160/100.     Patient Instructions: 1)  Check your Blood Pressure regularly. If it is above: 130/85 on average you should make an appointment.    ]

## 2010-10-20 NOTE — Progress Notes (Signed)
Summary: wants dr hopper to call-dr paz   Phone Note Call from Patient Call back at Home Phone [phone]   Caller: Spouse Summary of Call: patenet's husband wants Dr Alwyn Ren to call him - he wouldnt give any more info -  he said "just have him call me- that's all I'm telling you" Initial call taken by: Okey Regal Spring,  April 21, 2008 3:15 PM  Follow-up for Phone Call        Called Mr.Platt, he was not avaliable. Wife said she went to [facility] last for passing out,  mirgraines-stress related. Patient said she was instrucetd to call Dr.Hopper to see if he would like for her to have any additional work-up, Dr.Hopper please advise. Follow-up by: Shonna Chock,  April 21, 2008 3:36 PM  Additional Follow-up for Phone Call Additional follow up Details #1::        Neurology consult ; does she have preference (consult : syncope, migraines ; 780.2,784.0) Additional Follow-up by: Marga Melnick MD,  April 21, 2008 5:18 PM  New Problems: HEADACHE (ICD-784.0) SYNCOPE (ICD-780.2)   New Problems: HEADACHE (ICD-784.0) SYNCOPE (ICD-780.2)

## 2010-10-20 NOTE — Assessment & Plan Note (Signed)
Summary: ELEVATED BP CHECK  Nurse Visit   Vital Signs:  Patient Profile:   60 Years Old Female Weight:      274 pounds Pulse rate:   60 / minute Pulse rhythm:   regular BP sitting:   130 / 84  (left arm)  Pt. in pain?   no  Vitals Entered By: Wendall Stade (April 30, 2007 11:56 AM)                Current Allergies (reviewed today): ! * ZANAFLEX ! PCN ! SEPTRA ! * ACROMYCIN    Orders Added: 1)  Est. Patient Level III [78295]    Prescriptions: HYDROCHLOROTHIAZIDE 12.5 MG  TABS (HYDROCHLOROTHIAZIDE) 1 qd  #90 x 1   Entered and Authorized by:   Marga Melnick MD   Signed by:   Marga Melnick MD on 04/30/2007   Method used:   Print then Give to Patient   RxID:   [id number]   Chief Complaint:  elevated blood pressure.  Hypertension History:      She complains of headache and peripheral edema, but denies chest pain, palpitations, dyspnea with exertion, orthopnea, PND, visual symptoms, neurologic problems, and syncope.  She notes no problems with any antihypertensive medication side effects.  Further comments include: BP @ home up to 231/86 ; 186/86 later. Frontal band like headache.No major symptoms with HTN.        Positive major cardiovascular risk factors include female age 65 years old or older and hypertension.      Review of Systems  Neuro      Denies brief paralysis, disturbances in coordination, falling down, numbness, poor balance, seizures, tingling, and weakness.      occa trembling   Physical Exam  General:     alert, well-developed, well-nourished, well-hydrated, appropriate dress, and overweight-appearing.   Eyes:     Minor art narrowingvision grossly intact, pupils equal, pupils round, pupils reactive to light, pupils react to accomodation, corneas and lenses clear, no optic disk abnormalities, and normal cup-disc ratio.   Heart:     normal rate, regular rhythm, no gallop, no rub, no JVD, no HJR, and grade 1 /6 systolic murmur.     Abdomen:     Bowel sounds positive,abdomen soft and non-tender without masses, organomegaly or hernias noted.No AAA Pulses:     R and L carotid,radial,dorsalis pedis and posterior tibial pulses are full and equal bilaterally. No renal art bruits Extremities:     No clubbing, cyanosis, edema, or deformity noted with normal full range of motion of all joints.      Impression & Recommendations:  Problem # 1:  HYPERTENSION, ESSENTIAL NOS (ICD-401.9)  Her updated medication list for this problem includes:    Toprol Xl 50 Mg Tb24 (Metoprolol succinate) .Marland Kitchen... 1 by mouth qd    Micardis 80 Mg Tabs (Telmisartan) .Marland Kitchen... 1 once daily    Hydrochlorothiazide 12.5 Mg Tabs (Hydrochlorothiazide) .Marland Kitchen... 1 qd   Complete Medication List: 1)  Alprazolam 1 Mg Tabs (Alprazolam) .Marland Kitchen.. 1 by mouth tid 2)  Nexium 40 Mg Cpdr (Esomeprazole magnesium) .Marland Kitchen.. 1 by mouth qd 3)  Toprol Xl 50 Mg Tb24 (Metoprolol succinate) .Marland Kitchen.. 1 by mouth qd 4)  Flexeril 10 Mg Tabs (Cyclobenzaprine hcl) 5)  Prozac 40 Mg Caps (Fluoxetine hcl) .Marland Kitchen.. 1 by mouth qd 6)  Alber[employer]ons Ec Aspirin 325 Mg Tbec (Aspirin) .Marland Kitchen.. 1 by mouth qd 7)  Micardis 80 Mg Tabs (Telmisartan) .Marland Kitchen.. 1 once daily 8)  Claritin 10 Mg Tabs (Loratadine) .Marland KitchenMarland KitchenMarland Kitchen  1 once daily as needed 9)  Hydrochlorothiazide 12.5 Mg Tabs (Hydrochlorothiazide) .Marland Kitchen.. 1 qd  Hypertension Assessment/Plan:      The patient's hypertensive risk group is category B: At least one risk factor (excluding diabetes) with no target organ damage.  Today's blood pressure is 130/84.     Patient Instructions: 1)  BP goal = average < 130/85. 2)  Limit your Sodium (Salt) to less than 4 grams a day (slightly less than 1 teaspoon) to prevent fluid retention, swelling, or worsening or symptoms.

## 2010-10-20 NOTE — Assessment & Plan Note (Signed)
Summary: ELEVATED BLOOD PRESSURE/ALJ   Vital Signs:  Patient Profile:   60 Years Old Female Weight:      271.25 pounds Pulse rate:   60 / minute Pulse rhythm:   regular BP sitting:   148 / 82  (left arm) Cuff size:   large  Pt. in pain?   no  Vitals Entered By: Wendall Stade (March 04, 2007 4:43 PM)                Chief Complaint:  blood pressure elevated .  History of Present Illness: having dizziness with elevated blood pressure; BP 199/145 on 6/15 with her mother's cuff & also @ Dr Renaldo Reel office & @ dentist's office since 5/08.Took extra Toprol XL 6/15.Avoiding salt except tuna  & chips.  Hypertension History:      She complains of headache, but denies chest pain, palpitations, dyspnea with exertion, orthopnea, PND, peripheral edema, visual symptoms, neurologic problems, syncope, and side effects from treatment.  She notes no problems with any antihypertensive medication side effects.  Further comments include: frontal ,bilat.        Positive major cardiovascular risk factors include female age 59 years old or older and hypertension.     Current Allergies (reviewed today): ! * ZANAFLEX ! PCN ! SEPTRA ! * ACROMYCIN Updated/Current Medications (including changes made in today's visit):  ALPRAZOLAM 1 MG  TABS (ALPRAZOLAM) 1 by mouth tid NEXIUM 40 MG  CPDR (ESOMEPRAZOLE MAGNESIUM) 1 by mouth qd TOPROL XL 50 MG  TB24 (METOPROLOL SUCCINATE) 1 by mouth qd FLEXERIL 10 MG  TABS (CYCLOBENZAPRINE HCL)  PROZAC 40 MG  CAPS (FLUOXETINE HCL) 1 by mouth qd ALBERTSONS EC ASPIRIN 325 MG  TBEC (ASPIRIN) 1 by mouth qd LISINOPRIL 20 MG  TABS (LISINOPRIL) 1 once daily       Physical Exam  General:     overweight-appearing.   Eyes:     minimal art narrowing ;no papilloedema Lungs:     Normal respiratory effort, chest expands symmetrically. Lungs are clear to auscultation, no crackles or wheezes. Heart:     Normal rate and regular rhythm. S1 and S2 normal without gallop, murmur,  click, rub or other extra sounds. Abdomen:     Bowel sounds positive,abdomen soft and non-tender without masses, organomegaly or hernias noted. Pulses:     R and L carotid,radial,femoral,dorsalis pedis and posterior tibial pulses are full and equal bilaterally Extremities:     lipedema    Impression & Recommendations:  Problem # 1:  HYPERTENSION, ESSENTIAL (ICD-401.9)  Her updated medication list for this problem includes:    Toprol Xl 50 Mg Tb24 (Metoprolol succinate) .Marland Kitchen... 1 by mouth qd    Lisinopril 20 Mg Tabs (Lisinopril) .Marland Kitchen... 1 once daily   Medications Added to Medication List This Visit: 1)  Alprazolam 1 Mg Tabs (Alprazolam) .Marland Kitchen.. 1 by mouth tid 2)  Nexium 40 Mg Cpdr (Esomeprazole magnesium) .Marland Kitchen.. 1 by mouth qd 3)  Toprol Xl 50 Mg Tb24 (Metoprolol succinate) .Marland Kitchen.. 1 by mouth qd 4)  Flexeril 10 Mg Tabs (Cyclobenzaprine hcl) 5)  Prozac 40 Mg Caps (Fluoxetine hcl) .Marland Kitchen.. 1 by mouth qd 6)  [employer] Ec Aspirin 325 Mg Tbec (Aspirin) .Marland Kitchen.. 1 by mouth qd 7)  Lisinopril 20 Mg Tabs (Lisinopril) .Marland Kitchen.. 1 once daily  Hypertension Assessment/Plan:      The patient's hypertensive risk group is category B: At least one risk factor (excluding diabetes) with no target organ damage.  Today's blood pressure is 148/82.  Patient Instructions: 1)  monitor for cough; lip/tongue swelling with Lisinopril

## 2010-10-20 NOTE — Progress Notes (Signed)
Summary: called patient regarding critical potassium  Phone Note Outgoing Call   Call placed by: Olegario Messier bixby,cma Call placed to: Patient Summary of Call: called patient to have her go stat to [facility] and have a stat potassium drawn and they will call the lab to the office.  The lab called earlier and said her potassium was 8.9 but they felt it was in error.  Patient is on her way now....................................................................Marland KitchenWendall Stade  December 13, 2007 2:26 PM

## 2010-10-20 NOTE — Progress Notes (Signed)
Summary: hopper-elevated blood pressures  Phone Note Call from Patient Call back at Work Phone [phone]   Caller: Patient Summary of Call: pt has been experiencing very high blood pressures (182/136 & 183/120), dizziness and headaches.  she is calling because she wants to know what she should do. Initial call taken by: Gwen Pounds,  December 10, 2007 3:48 PM  Follow-up for Phone Call        S/w pt who c/o elevated bp see numbers some headaches pt is taking Toprol 100mg  1 every am rx was changed to 100 mg back in 8/08 when hctz was d/c due to sulfa allergies. Dr Alwyn Ren what do you recommend for this pt to bring bp down? pt has ov with you thur 3/26 your next avail appt pt uses [facility]--please advise...................................................................Marland KitchenKandice Hams  December 10, 2007 4:23 PM  Follow-up by: Kandice Hams,  December 10, 2007 4:23 PM  Additional Follow-up for Phone Call Additional follow up Details #1::        Benicar 40 added to Beta blocker; electronically transmitted; please double check it went through Additional Follow-up by: Marga Melnick MD,  December 10, 2007 5:38 PM    Additional Follow-up for Phone Call Additional follow up Details #2::    S/W PHARMACY RX DID NOT GO THROUGH, RX REPRINTED AND [facility]XED TO [facility]. PT USES [facility]  FAX # [id number] PT HAS BEEN INFORMED.Kandice Hams  December 11, 2007 3:52 PM  Follow-up by: Kandice Hams,  December 11, 2007 4:06 PM  New/Updated Medications: BENICAR 40 MG  TABS (OLMESARTAN MEDOXOMIL) 1 qd   Prescriptions: BENICAR 40 MG  TABS (OLMESARTAN MEDOXOMIL) 1 qd  #30 x 5   Entered by:   Kandice Hams   Authorized by:   Marga Melnick MD   Signed by:   Kandice Hams on 12/11/2007   Method used:   Reprint   RxID:   [id number] BENICAR 40 MG  TABS (OLMESARTAN MEDOXOMIL) 1 qd  #30 x 5   Entered by:   Marga Melnick MD   Authorized by:   Wendall Stade   Signed by:   Marga Melnick MD on 12/10/2007  Method used:   Electronically sent to ...       [id number]       [address]       [place], [place]  [address]       Ph: [phone]       Fax: [phone]   RxID:   [id number]

## 2010-11-03 NOTE — Medication Information (Signed)
Summary: Non-Adherent with aniotensin receptor antagonist/[facility]  Non-Adherent with aniotensin receptor antagonist/[facility]   Imported By: Sherian Rein 10/25/2010 10:18:28  _____________________________________________________________________  External Attachment:    Type:   Image     Comment:   External Document

## 2010-12-05 LAB — TYPE AND SCREEN
ABO/RH(D): O POS
Antibody Screen: NEGATIVE

## 2010-12-05 LAB — BASIC METABOLIC PANEL
BUN: 10 mg/dL (ref 6–23)
BUN: 6 mg/dL (ref 6–23)
BUN: 9 mg/dL (ref 6–23)
CO2: 29 mEq/L (ref 19–32)
CO2: 29 mEq/L (ref 19–32)
CO2: 29 mEq/L (ref 19–32)
Calcium: 7.8 mg/dL — ABNORMAL LOW (ref 8.4–10.5)
Calcium: 7.9 mg/dL — ABNORMAL LOW (ref 8.4–10.5)
Calcium: 8 mg/dL — ABNORMAL LOW (ref 8.4–10.5)
Chloride: 101 mEq/L (ref 96–112)
Chloride: 103 mEq/L (ref 96–112)
Chloride: 104 mEq/L (ref 96–112)
Creatinine, Ser: 0.7 mg/dL (ref 0.4–1.2)
Creatinine, Ser: 0.75 mg/dL (ref 0.4–1.2)
Creatinine, Ser: 0.87 mg/dL (ref 0.4–1.2)
GFR calc Af Amer: >60 mL/min (ref >60)
GFR calc Af Amer: >60 mL/min (ref >60)
GFR calc Af Amer: >60 mL/min (ref >60)
GFR calc non Af Amer: >60 mL/min (ref >60)
GFR calc non Af Amer: >60 mL/min (ref >60)
GFR calc non Af Amer: >60 mL/min (ref >60)
Glucose, Bld: 108 mg/dL — ABNORMAL HIGH (ref 70–99)
Glucose, Bld: 123 mg/dL — ABNORMAL HIGH (ref 70–99)
Glucose, Bld: 135 mg/dL — ABNORMAL HIGH (ref 70–99)
Potassium: 3.4 mEq/L — ABNORMAL LOW (ref 3.5–5.1)
Potassium: 3.7 mEq/L (ref 3.5–5.1)
Potassium: 3.7 mEq/L (ref 3.5–5.1)
Sodium: 134 mEq/L — ABNORMAL LOW (ref 135–145)
Sodium: 136 mEq/L (ref 135–145)
Sodium: 137 mEq/L (ref 135–145)

## 2010-12-05 LAB — COMPREHENSIVE METABOLIC PANEL
ALT: 35 U/L (ref 0–35)
AST: 23 U/L (ref 0–37)
Albumin: 4.1 g/dL (ref 3.5–5.2)
Alkaline Phosphatase: 76 U/L (ref 39–117)
BUN: 18 mg/dL (ref 6–23)
CO2: 29 mEq/L (ref 19–32)
Calcium: 9.1 mg/dL (ref 8.4–10.5)
Chloride: 104 mEq/L (ref 96–112)
Creatinine, Ser: 0.85 mg/dL (ref 0.4–1.2)
GFR calc Af Amer: >60 mL/min (ref >60)
GFR calc non Af Amer: >60 mL/min (ref >60)
Glucose, Bld: 96 mg/dL (ref 70–99)
Potassium: 4.2 mEq/L (ref 3.5–5.1)
Sodium: 138 mEq/L (ref 135–145)
Total Bilirubin: 0.7 mg/dL (ref 0.3–1.2)
Total Protein: 6.7 g/dL (ref 6.0–8.3)

## 2010-12-05 LAB — CBC
HCT: 24.7 % — ABNORMAL LOW (ref 36.0–46.0)
HCT: 28.4 % — ABNORMAL LOW (ref 36.0–46.0)
HCT: 28.4 % — ABNORMAL LOW (ref 36.0–46.0)
HCT: 38.6 % (ref 36.0–46.0)
Hemoglobin: 8.4 g/dL — ABNORMAL LOW (ref 12.0–15.0)
Hemoglobin: 9.3 g/dL — ABNORMAL LOW (ref 12.0–15.0)
Hemoglobin: 9.8 g/dL — ABNORMAL LOW (ref 12.0–15.0)
Hemoglobin: 12.8 g/dL (ref 12.0–15.0)
MCHC: 32.8 g/dL (ref 30.0–36.0)
MCHC: 34 g/dL (ref 30.0–36.0)
MCHC: 34.5 g/dL (ref 30.0–36.0)
MCHC: 33.1 g/dL (ref 30.0–36.0)
MCV: 90.2 fL (ref 78.0–100.0)
MCV: 92.8 fL (ref 78.0–100.0)
MCV: 93.8 fL (ref 78.0–100.0)
MCV: 92.2 fL (ref 78.0–100.0)
Platelets: 151 10*3/uL (ref 150–400)
Platelets: 160 10*3/uL (ref 150–400)
Platelets: 171 10*3/uL (ref 150–400)
Platelets: 193 10*3/uL (ref 150–400)
RBC: 2.66 MIL/uL — ABNORMAL LOW (ref 3.87–5.11)
RBC: 3.03 MIL/uL — ABNORMAL LOW (ref 3.87–5.11)
RBC: 3.15 MIL/uL — ABNORMAL LOW (ref 3.87–5.11)
RBC: 4.19 MIL/uL (ref 3.87–5.11)
RDW: 13.2 % (ref 11.5–15.5)
RDW: 13.5 % (ref 11.5–15.5)
RDW: 15.1 % (ref 11.5–15.5)
RDW: 13.9 % (ref 11.5–15.5)
WBC: 5.5 10*3/uL (ref 4.0–10.5)
WBC: 6.2 10*3/uL (ref 4.0–10.5)
WBC: 6.2 10*3/uL (ref 4.0–10.5)
WBC: 4.4 10*3/uL (ref 4.0–10.5)

## 2010-12-05 LAB — PROTIME-INR
INR: 1.19 (ref 0.00–1.49)
INR: 1.53 — ABNORMAL HIGH (ref 0.00–1.49)
INR: 1.86 — ABNORMAL HIGH (ref 0.00–1.49)
INR: 1 (ref 0.00–1.49)
Prothrombin Time: 15 seconds (ref 11.6–15.2)
Prothrombin Time: 18.3 seconds — ABNORMAL HIGH (ref 11.6–15.2)
Prothrombin Time: 21.3 seconds — ABNORMAL HIGH (ref 11.6–15.2)
Prothrombin Time: 13.1 seconds (ref 11.6–15.2)

## 2010-12-05 LAB — URINALYSIS, ROUTINE W REFLEX MICROSCOPIC
Bilirubin Urine: NEGATIVE
Glucose, UA: NEGATIVE mg/dL
Hgb urine dipstick: NEGATIVE
Ketones, ur: NEGATIVE mg/dL
Nitrite: NEGATIVE
Protein, ur: NEGATIVE mg/dL
Specific Gravity, Urine: 1.028 (ref 1.005–1.030)
Urobilinogen, UA: 0.2 mg/dL (ref 0.0–1.0)
pH: 5.5 (ref 5.0–8.0)

## 2010-12-05 LAB — ABO/RH: ABO/RH(D): O POS

## 2010-12-05 LAB — APTT: aPTT: 30 seconds (ref 24–37)

## 2011-01-23 ENCOUNTER — Encounter: Payer: Self-pay | Admitting: Internal Medicine

## 2011-01-23 ENCOUNTER — Ambulatory Visit (INDEPENDENT_AMBULATORY_CARE_PROVIDER_SITE_OTHER): Payer: 59 | Admitting: Internal Medicine

## 2011-01-23 VITALS — BP 126/88 | HR 76 | Temp 98.4°F | Wt 277.0 lb

## 2011-01-23 DIAGNOSIS — N644 Mastodynia: Secondary | ICD-10-CM

## 2011-01-23 NOTE — Patient Instructions (Signed)
Use Cortaid topically twice a day as needed for the itching. Mammograms will be tentatively scheduled at the breast center. After the physician evaluates you  Imaging option  may be altered.

## 2011-01-23 NOTE — Progress Notes (Signed)
  Subjective:    Patient ID: Kathy [name], female    DOB: 07-12-1951, 60 y.o.   MRN: [id number]  HPI In January of this year she had mammograms to monitor her fibrocystic breast disease. Since that time she's had  Pruritis of the areola. It has progressed  to  a "burning" 6 -8 weeks ago.The burning has radiated into the R axillary breast tissue . She denies any breast discharge. The [facility] Center would not reschedule followup until she was reevaluated. She has had no treatment for this; specifically she has not applied any topical agents.  She denies any constitutional symptoms of fever, sweats, or weight loss. She is not on HRT.She avoids caffeine.  Significantly her mother has a history of breast cancer. PMH of lumpectomy (benign) ? 2002.    Review of Systems     Objective:   Physical Exam she is in no acute distress. Skin is warm and dry with no significant lesions. Specifically there are no areolar changes.  She has no lymphadenopathy about the head, neck, or eczema.  An operative scar is present on the right breast . She has very mild fibrocystic changes on the left greater than the right.  No organomegaly is present.          Assessment & Plan:  #1 Pruritis and burning of the areola; clinically no visual changes present. Clinically mild fibrocystic changes are noted.  Plan: Because of persistent symptoms with new extension into a axilla breast tissue, imaging will be repeated. If this is negative I would recommend a trial of Cort Aid to the  areola. If this failed to resolve the symptoms I would recommend dermatologic evaluation.

## 2011-01-24 NOTE — Progress Notes (Signed)
Addended by: Doristine Devoid on: 01/24/2011 10:14 AM   Modules accepted: Orders

## 2011-01-25 ENCOUNTER — Other Ambulatory Visit: Payer: Self-pay | Admitting: Internal Medicine

## 2011-01-31 NOTE — Assessment & Plan Note (Signed)
[facility]                                 ON-CALL NOTE   NAME:[name], Kathy Gross                           MRN:          [id number]  DATE:03/03/2007                            DOB:          [birth year]    TIME OF CALL:  5:56pm   TELEPHONE NUMBER:  [phone]   OBJECTIVE:  The patient's blood pressure has been high. She has a  headache and is dizzy. She is on Toprol 50 mg daily and wants to know if  she can take more. Recently, her blood pressure at the dentist was high,  the dermatologist also. Their mother has checked it with a machine that  she has at home and it has been as high as 193/135. She was last seen 6  or 8 weeks ago and blood pressure was okay then. She avoids salt and has  not really changed her diet much recently, and her husband also has high  blood pressure.   ASSESSMENT:  High blood pressure, poor control.   PLAN:  I would have her take a Toprol twice daily for right now and see  Hop as soon as possible, keep her fluid intake up, and to the hospital  if symptoms worsen.   PRIMARY CARE PHYSICIAN:  Dr. Alwyn Ren.   HOME OFFICE:  [facility].     Arta Silence, MD  Electronically Signed    RNS/MedQ  DD: 03/03/2007  DT: 03/04/2007  Job #: [id number]

## 2011-01-31 NOTE — Assessment & Plan Note (Signed)
[facility]                            CARDIOLOGY OFFICE NOTE   NAME:[name], Kathy HOLDREN                   MRN:          [id number]  DATE:08/20/2007                            DOB:          1951-04-09    NEW PATIENT VISIT   PRIMARY CARE PHYSICIAN:  Titus Dubin. Alwyn Ren, MD, FACP, FCCP   REASON FOR VISIT:  Establish cardiac followup.   HISTORY OF PRESENT ILLNESS:  Kathy [name] is a 60 year old woman with a  history of obesity, hypertension, previous right knee arthroscopic  surgery with subsequent deep venous thrombosis, and apparent history of  what sounds to be fairly mild valvular heart disease with a prior  history of Redux use. She has been followed longterm by Dr. Aggie Cosier  with serial echocardiograms and based on her description has not been  found to have any major valvular abnormalities requiring anything other  than observation. She has had occasional palpitations, but nothing  progressive and denies any angina or increasing breathlessness with  activity. She has had previous ischemic evaluation and underwent a  Cardiolite back in 2004, which revealed no evidence of ischemia with an  ejection fraction of 64%. Her last echocardiogram was approximately one  year ago. She is having no problems with syncope, lower extremity edema,  orthopnea or PND.  She states she was told she has mitral valve  prolapse.   MEDICATIONS:  1. Aspirin.  2. Alprazolam 1 mg as directed.  3. Nexium 40 mg daily.  4. Toprol XL 100 mg daily.  5. Prozac.  6. Motrin as needed.  7. Folic acid.  8. Centrum Silver supplements.  9. Calcium supplements.   PAST MEDICAL HISTORY:  Is as detailed above. The patient reports  previous hysterectomy and tubal ligation, cholecystectomy, appendectomy.  No clear history of cardiovascular disease. Prior history of headaches  and neurological evaluation. Possible history per Dr. Frederik Pear notes of  mitral valve prolapse.   SOCIAL  HISTORY:  The patient is married. She has two children. She is a  housewife. Drinks alcohol socially. Denies any tobacco use. Does not  exercise regularly.   REVIEW OF SYSTEMS:  As described in History of Present Illness. Has  problems with fibromyalgia, arthritic knee pain, reflux, some problems  with anxiety and depression, hiatal hernia and seasonal allergies.   FAMILY HISTORY:  No obvious premature cardiovascular disease based on  information provided.   PHYSICAL EXAMINATION:  Blood pressure initially 177/102, rechecked  160/90. Heart rate 76 and regular. Weight is 281 pounds. Is a morbidly  obese woman in no acute distress without any active symptoms.  HEENT: Conjunctivae is normal. Oropharynx is clear.  NECK: Supple, without elevated jugular pressure, without bruits. No  thyromegaly.  LUNGS:  Are clear without labored breathing.  CARDIAC: Reveals a regular rate and rhythm with soft systolic murmur at  the base. Second heart sound is normal. No obvious mid systolic click is  audible. No S3 gallop or pericardial rub.  EXTREMITIES: Exhibit no significant pitting edema. Distal pulses are 2+.  SKIN: Warm and dry.  MUSCULOSKELETAL: No kyphosis is noted.  NEURO/PSYCHIATRIC: The patient  is alert and oriented x3. Affect is  normal.   A 12 lead electrocardiogram today is normal, showing sinus rhythm at 68  beats per minute.   IMPRESSIONS AND RECOMMENDATIONS:  1. History of what sounds to be fairly mild regurgitant valvular heart      disease, presumably mitral regurgitation based on very limited      information. There is also a possible history of mitral valve      prolapse. The patient reports remote use of Redux and was      previously followed by Dr. Lucas Mallow, now establishing with our      practice. She is not reporting any problems with progressive      palpitations, chest pain or breathlessness. Last echocardiogram was      approximately one year ago. I have recommended a  followup 2D      echocardiogram to clearly assess valvular structure and function.      With this information, we can make better recommendations regarding      followup and endocarditis prophylaxis options. We will anticipate a      one year visit for symptom review.  2. Hypertension, followup with Dr. Alwyn Ren. She is undergoing some      medication adjustments.     Jonelle Sidle, MD  Electronically Signed    SGM/MedQ  DD: 08/20/2007  DT: 08/20/2007  Job #: [id number]   cc:   Titus Dubin. Alwyn Ren, MD,FACP,FCCP

## 2011-02-01 ENCOUNTER — Ambulatory Visit
Admission: RE | Admit: 2011-02-01 | Discharge: 2011-02-01 | Disposition: A | Discharge status: Home / Self Care | Payer: 59 | Source: Ambulatory Visit | Attending: Internal Medicine | Admitting: Internal Medicine

## 2011-02-01 DIAGNOSIS — N644 Mastodynia: Secondary | ICD-10-CM

## 2011-02-03 NOTE — Discharge Summary (Signed)
Kathy [name], Kathy [name]            ACCOUNT NO.:  [id number]   MEDICAL RECORD NO.:  [id number]          PATIENT TYPE:  OBV   LOCATION:  5035                         FACILITY:  [facility]   PHYSICIAN:  Rene Paci, M.D. LHCDATE OF BIRTH:  February 20, 1951   DATE OF ADMISSION:  09/08/2004  DATE OF DISCHARGE:  09/12/2004                                 DISCHARGE SUMMARY   DISCHARGE DIAGNOSES:  1.  Deep venous thrombosis.  2.  Hemarthrosis.   BRIEF ADMISSION HISTORY:  [name] is a 60 year old white female who had a  right knee arthroscopy on August 31, 2004.  She came to the hospital on  September 04, 2004, and was diagnosed with a DVT.  She was started on Lovenox  and Coumadin per protocol.  During that evaluation, chest CT was negative  for PE.  On September 08, 2004, she had sutures removed.  She was noted to  have some bleeding.  Since then, she reports her knee felt hard and then  developed significant bleeding from suture sites.  She came to the emergency  room for evaluation.  Again CT was negative for PE.  CBC was stable.  She  was admitted for observation.   PAST MEDICAL HISTORY:  Not available.   HOSPITAL COURSE:  The patient has had a recent right knee arthroscopy  complicated by development of DVT, currently on Lovenox and Coumadin.  The  patient was seen in consultation by ortho during this admission.  Lower  extremity venous Dopplers showed no evidence of acute or new DVT.  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  They recommended  warm and cold compresses for symptomatic relief.  Despite this, the patient  continued to have significant knee pain.  Ortho suspected that she had a  hemarthrosis, and they did aspirate on September 11, 2004, with significant  improvement in her pain.  It was therefore felt that she was stable for  discharge home with oral pain medications.  At discharge, INR was 2.9.   DISCHARGE INSTRUCTIONS:  Coumadin 5 mg daily.   She has been instructed to  call Dr. Frederik Pear office on Wednesday, October 15, 2004, for a pro time.  She is otherwise instructed to resume her medications as at home.      Laur   LC/MEDQ  D:  09/12/2004  T:  09/12/2004  Job:  [id number]   cc:   Titus Dubin. Alwyn Ren, M.D. [facility]   Rosalyn Gess. Norins, M.D. [facility]

## 2011-02-03 NOTE — H&P (Signed)
NAMEHELAYNA, [name]            ACCOUNT NO.:  [id number]   MEDICAL RECORD NO.:  [id number]          PATIENT TYPE:  OBV   LOCATION:  5035                         FACILITY:  [facility]   PHYSICIAN:  Rosalyn Gess. Norins, M.D. LHCDATE OF BIRTH:  Dec 29, 1950   DATE OF ADMISSION:  09/08/2004  DATE OF DISCHARGE:                                HISTORY & PHYSICAL   TIME:  Is 0200 hours.   CHIEF COMPLAINT:  Left chest pain.   HISTORY OF PRESENT ILLNESS:  Kathy [name] is a 60 year old, married, white  female, followed by Dr. Lona Kettle.  The patient had a right knee  arthroscopic surgery, on August 31, 2004, by Dr. Margarito Liner. [place].  The  patient came to the hospital, on September 02, 2004, diagnosed with a DVT and  was started on Lovenox and Coumadin protocol.  During that stay, she had a  CT of the chest which was negative for PE.  On September 08, 2004, the  patient was seen in Dr. Candise Bowens office and had sutures removed with  minimal bleeding.  She reports that she went home and was feeling relatively  well with walking with a cane.  Later in the evening she felt that her knee  became quite hard, and then she developed a significant amount of bleeding  from the suture site that soaked her bandages.  She became very lightheaded,  developed nausea and weakness at this point.  Shortly thereafter, she  reports she developed left chest pain of a mild stinging nature.  She denied  shortness of breath.  She denies diaphoresis or radiation of her pain or  discomfort.  Because of her symptoms, she came to the [facility]  Department for evaluation.  A CT scan of the chest was negative for PE.  Chemistries and CBC were normal.  The patient, however, felt ill and was  very concerned and worried about her health.  She is now admitted for  observation and monitoring of her symptoms.   PAST MEDICAL HISTORY:  Surgical:  1.  Tubal ligation followed T[facility].  2.  She had an oophorectomy.  3.  She has had  laparoscopic cholecystectomy.  4.  She has had an appendectomy.  5.  She has had a left hand cyst excised.  6.  She has had a breast biopsy that was negative.  7.  She has had right knee arthroscopy as noted.  Medical illnesses:  1.  She had the usual childhood diseases.  2.  History of fibromyalgia.  3.  History of GERD.  4.  History of hypertension.  5.  She does have a history of a heart murmur which has been followed by Dr.      Jaclyn Prime. [place], with annual ultrasounds which is evidently stable.  6.  History of diverticulosis.  7.  History of depression and anxiety.   CURRENT MEDICATIONS:  1.  Nexium 40 mg daily.  2.  Xanax 1 mg b.i.d.  3.  Prozac 40 mg daily.  4.  Toprol XL 50 mg daily.  5.  Flexeril 10 mg p.r.n.   HABITS:  Tobacco:  None.  Alcohol:  Rare.   DRUG ALLERGIES:  PENICILLIN in the past.  She does not recall specific  reaction.   HEALTH MAINTENANCE:  1.  The patient sees Dyann Ruddle, Nurse Practitioner for annual pelvic      exam.  2.  She sees Dr. Currie Paris for annual breast exam, last in January      2005.  3.  The patient did have a colonoscopy, in 2003, by Dr. Iva Boop.   FAMILY HISTORY:  Mother with a history of breast cancer and mastectomy.  She  also had DVT.   SOCIAL HISTORY:  The patient lives with her husband in a two story house.  She has two sons.  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   REVIEW OF SYSTEMS:  Negative for any constitutional, cardiovascular,  respiratory, GI problems, except as noted above.   PHYSICAL EXAMINATION:  VITAL SIGNS:  On admission, temperature was 98, blood  pressure 137/61, pulse was 60, respirations 20, O2 sat was 98% on room air.  GENERAL APPEARANCE:  This is an obese white female in no acute distress.  HEENT:  EACs and TMs were normal.  Skull is normal with no signs of trauma.  The oropharynx revealed native dentition in good repair.  No oral lesions  were noted.   Conjunctivae and sclerae were clear.  Pupils equal, round, and  reactive.  Funduscopic exam was unremarkable.  NECK:  Supple without thyromegaly.  NODES:  No adenopathy was noted in the cervical, supraclavicular region.  CHEST:  Clear with no rales, wheezes, or rhonchi.  There was no CVA  tenderness.  BREAST:  Deferred to Dr. Tenna Child exam.  CARDIOVASCULAR:  With 2+ radial pulses.  No JVD or carotid bruits.  She had  a quiet epicordium with a regular rate and rhythm without murmurs, rubs or  gallops.  ABDOMEN:  Obese, soft.  No guarding or rebound.  No organomegaly,  splenomegaly was noted.  PELVIC/RECTAL:  Deferred.  EXTREMITIES:  The patient's right knee is swollen.  It is warm to the touch.  There is a large ecchymosis.  There are obvious signs of recent bleeding  from her suture site.  NEUROLOGIC:  Grossly nonfocal.   DATABASE:  Hemoglobin 12.4 grams, white count 6,900 with 75% segs, 17%  lymphs, 7% monos.  Sodium was 138, potassium 4.3, chloride 105, CO2 at 38,  BUN at 23, creatinine 0.6, glucose was 91.  INR was 1.3.   EKG showed a normal bradycardia.   CT of the chest was negative for PE.   ASSESSMENT/PLAN:  1.  Orthopedic.  A patient with recent right knee arthroscopy followed by      deep vein thrombosis.  Now with significant apparent bleed with      ecchymosis.  Plan:  We will notify Dr. Margarito Liner. Caffrey and ask for      consultation.  2.  Cardiovascular.  The patient's chest symptoms are very atypical.  Her      exam is normal.  Her electrocardiogram is normal.  She did have a normal      stress Cardiolite in September 2004.  Her risk factors are moderate.      Plan:  No further evaluation indicated at this time.  3.  Coags.  The patient's INR had dropped to 1.3. Her hemoglobin is stable.      There is no sign of bleeding, except for the right knee  site.  Plan:  We      will continue Coumadin at 5 mg daily. 4.  Hypertension.  Blood pressure is stable.  We will continue  her home      medications.  5.  Fibromyalgia, stable.  6.  Anxiety and depression, stable on her present medications.       MEN/MEDQ  D:  09/09/2004  T:  09/09/2004  Job:  [id number]   cc:   Titus Dubin. Alwyn Ren, M.D. [facility]   Thera Flake., M.D.  [address] [place]  [place] [address]  Fax: [phone]

## 2011-02-06 ENCOUNTER — Other Ambulatory Visit: Payer: Self-pay | Admitting: Internal Medicine

## 2011-04-06 ENCOUNTER — Encounter: Payer: Self-pay | Admitting: Internal Medicine

## 2011-04-06 ENCOUNTER — Ambulatory Visit (INDEPENDENT_AMBULATORY_CARE_PROVIDER_SITE_OTHER): Payer: 59 | Admitting: Internal Medicine

## 2011-04-06 VITALS — BP 140/90 | Temp 98.2°F | Wt 277.0 lb

## 2011-04-06 DIAGNOSIS — J069 Acute upper respiratory infection, unspecified: Secondary | ICD-10-CM

## 2011-04-06 DIAGNOSIS — J0100 Acute maxillary sinusitis, unspecified: Secondary | ICD-10-CM

## 2011-04-06 DIAGNOSIS — J0110 Acute frontal sinusitis, unspecified: Secondary | ICD-10-CM

## 2011-04-06 MED ORDER — CLARITHROMYCIN ER 500 MG PO TB24: 1000.0000 mg | ORAL_TABLET | Freq: Every day | ORAL | Status: AC

## 2011-04-06 NOTE — Patient Instructions (Signed)
Plain Mucinex for thick secretions ;force NON dairy fluids for next 48 hrs. Use a Neti pot daily as needed for sinus congestion 

## 2011-04-06 NOTE — Progress Notes (Signed)
  Subjective:    Patient ID: Kathy [name], female    DOB: [birth year], 60 y.o.   MRN: [id number]  HPI Respiratory tract infection Onset/symptoms:2-3 weeks ago as PNDrainage & ST Exposures (illness/environmental/extrinsic): allergens Progression of symptoms:L facial pain Treatments/response:no Rx Fever/chills/sweats:only sweats Frontal headache:L frontal Facial pain:L sided Nasal purulence:no Sore throat:yes Dental pain:L gums Lymphadenopathy:no Wheezing/shortness of breath:no Cough/sputum:not significant Associated extrinsic/allergic symptoms:itchy eyes/ sneezing:minimal Past medical history: asthma:no Smoking history:never           Review of Systems     Objective:   Physical Exam General appearance: no acute distress or increased work of breathing is present.  No  lymphadenopathy about the head, neck, or axilla noted.   Eyes: No conjunctival inflammation or lid edema is present. Vision intact.  Ears:  External ear exam shows no significant lesions or deformities.  Otoscopic examination reveals clear canals, tympanic membranes are intact bilaterally without bulging, retraction, inflammation or discharge.  Nose:  External nasal examination shows no deformity or inflammation. Nasal mucosa are pink and moist without lesions or exudates. No septal dislocation or dislocation.No obstruction to airflow.   Oral exam: Dental hygiene is good; lips and gums are healthy appearing.There is no oropharyngeal erythema or exudate noted.   Neck:  No deformities, thyromegaly, masses, or tenderness noted.   Supple with full range of motion ; some discomfort with ROM to L.   Heart:  Normal rate and regular rhythm. S1 and S2 normal without gallop,  click, rub or other extra sounds.  Grade 1/6 systolic murmur  Lungs:Chest clear to auscultation; no wheezes, rhonchi,rales ,or rubs present.No increased work of breathing.    Extremities:  No cyanosis or clubbing  noted    Skin: Warm &  dry w/o jaundice or tenting.          Assessment & Plan:  #1 upper respiratory tract infection; history suggest left frontal and left maxillary sinusitis. Lack of purulent secretions suggest involvement with Mycoplasma, Legionella, or non veneral chlamydia  #2 sulfa and penicillin allergies  Plan: See orders

## 2011-04-10 ENCOUNTER — Other Ambulatory Visit: Payer: Self-pay | Admitting: Obstetrics and Gynecology

## 2011-04-10 DIAGNOSIS — Z1231 Encounter for screening mammogram for malignant neoplasm of breast: Secondary | ICD-10-CM

## 2011-04-24 ENCOUNTER — Telehealth: Payer: Self-pay | Admitting: Internal Medicine

## 2011-04-24 NOTE — Telephone Encounter (Signed)
Finished meds on 04/17/11 and her symptoms are back. The symptoms are exactly the same but no fever. Wanted to know if she could get another ABX or would you like to see her again. Please advise   KP

## 2011-04-25 MED ORDER — METRONIDAZOLE 500 MG PO TABS: 500.0000 mg | ORAL_TABLET | Freq: Three times a day (TID) | ORAL | Status: AC

## 2011-04-25 NOTE — Telephone Encounter (Signed)
Pt called aware of information.

## 2011-04-25 NOTE — Telephone Encounter (Signed)
Metronidazole 500 mg 3 times a day #21; avoid alcohol while on this. Office visit if no better

## 2011-05-09 ENCOUNTER — Ambulatory Visit: Payer: 59

## 2011-05-31 ENCOUNTER — Ambulatory Visit
Admission: RE | Admit: 2011-05-31 | Discharge: 2011-05-31 | Disposition: A | Discharge status: Home / Self Care | Payer: 59 | Source: Ambulatory Visit | Attending: Obstetrics and Gynecology | Admitting: Obstetrics and Gynecology

## 2011-05-31 DIAGNOSIS — Z1231 Encounter for screening mammogram for malignant neoplasm of breast: Secondary | ICD-10-CM

## 2011-06-16 LAB — DIFFERENTIAL
Basophils Absolute: 0
Basophils Relative: 0
Eosinophils Absolute: 0.1
Eosinophils Relative: 2
Lymphocytes Relative: 26
Lymphs Abs: 1.1
Monocytes Absolute: 0.2
Monocytes Relative: 6
Neutro Abs: 2.7
Neutrophils Relative %: 67

## 2011-06-16 LAB — COMPREHENSIVE METABOLIC PANEL
ALT: 39 — ABNORMAL HIGH
AST: 35
Albumin: 3.8
Alkaline Phosphatase: 79
BUN: 10
CO2: 25
Calcium: 9.2
Chloride: 103
Creatinine, Ser: 0.74
GFR calc Af Amer: >60
GFR calc non Af Amer: >60
Glucose, Bld: 118 — ABNORMAL HIGH
Potassium: 3.5
Sodium: 139
Total Bilirubin: 0.6
Total Protein: 6.5

## 2011-06-16 LAB — URINALYSIS, ROUTINE W REFLEX MICROSCOPIC
Bilirubin Urine: NEGATIVE
Glucose, UA: NEGATIVE
Hgb urine dipstick: NEGATIVE
Ketones, ur: NEGATIVE
Nitrite: NEGATIVE
Protein, ur: NEGATIVE
Specific Gravity, Urine: 1.009
Urobilinogen, UA: 0.2
pH: 6

## 2011-06-16 LAB — CBC
HCT: 39.9
Hemoglobin: 13.5
MCHC: 33.9
MCV: 89.4
Platelets: 180
RBC: 4.46
RDW: 14.1
WBC: 4.1

## 2011-07-28 ENCOUNTER — Other Ambulatory Visit (INDEPENDENT_AMBULATORY_CARE_PROVIDER_SITE_OTHER): Payer: 59

## 2011-07-28 ENCOUNTER — Ambulatory Visit (INDEPENDENT_AMBULATORY_CARE_PROVIDER_SITE_OTHER): Payer: 59 | Admitting: Internal Medicine

## 2011-07-28 ENCOUNTER — Encounter: Payer: Self-pay | Admitting: Internal Medicine

## 2011-07-28 VITALS — BP 138/76 | HR 64 | Ht 66.0 in | Wt 287.0 lb

## 2011-07-28 DIAGNOSIS — K921 Melena: Secondary | ICD-10-CM

## 2011-07-28 LAB — CBC WITH DIFFERENTIAL/PLATELET
Basophils Absolute: 0 10*3/uL (ref 0.0–0.1)
Basophils Relative: 0.5 % (ref 0.0–3.0)
Eosinophils Absolute: 0.2 10*3/uL (ref 0.0–0.7)
Eosinophils Relative: 3.7 % (ref 0.0–5.0)
HCT: 40 % (ref 36.0–46.0)
Hemoglobin: 13.5 g/dL (ref 12.0–15.0)
Lymphocytes Relative: 33.3 % (ref 12.0–46.0)
Lymphs Abs: 1.8 10*3/uL (ref 0.7–4.0)
MCHC: 33.7 g/dL (ref 30.0–36.0)
MCV: 91.1 fl (ref 78.0–100.0)
Monocytes Absolute: 0.5 10*3/uL (ref 0.1–1.0)
Monocytes Relative: 9.7 % (ref 3.0–12.0)
Neutro Abs: 2.9 10*3/uL (ref 1.4–7.7)
Neutrophils Relative %: 52.8 % (ref 43.0–77.0)
Platelets: 202 10*3/uL (ref 150.0–400.0)
RBC: 4.39 Mil/uL (ref 3.87–5.11)
RDW: 14.6 % (ref 11.5–14.6)
WBC: 5.5 10*3/uL (ref 4.5–10.5)

## 2011-07-28 MED ORDER — PEG-KCL-NACL-NASULF-NA ASC-C 100 G PO SOLR: 1.0000 | Freq: Once | ORAL | Status: DC

## 2011-07-28 MED ORDER — HYDROCODONE-ACETAMINOPHEN 5-500 MG PO TABS
1.0000 | ORAL_TABLET | ORAL | Status: DC | PRN
Start: 2011-07-28 — End: 2012-03-20

## 2011-07-28 NOTE — Progress Notes (Signed)
Subjective:    Patient ID: Kathy [name], female    DOB: 04-24-1951, 60 y.o.   MRN: [id number]  HPI This pleasant 60 year old white woman says she's been having melena. She describes very dark stools that started 3-4 days ago. It lasted for about 3 days and then started to resolve. She suffers with fibromyalgia and has been using Advil most nights in the last few weeks. She continues to have urgent defecation after meals at times compatible with her known IBS. She's had some upper abdominal discomfort and pain as well lately. She does take her omeprazole on a daily basis. Outpatient Prescriptions Prior to Visit  Medication Sig Dispense Refill  . ALPRAZolam (XANAX) 1 MG tablet Take 1 mg by mouth 3 (three) times daily as needed.        Marland Kitchen aspirin 325 MG tablet Take 325 mg by mouth daily.        Marland Kitchen BENICAR 40 MG tablet take 1 tablet by mouth once daily  90 tablet  1  . Calcium Carb-Cholecalciferol (CALCIUM 1000 + D PO) Take by mouth daily.        . cyclobenzaprine (FLEXERIL) 10 MG tablet Take 10 mg by mouth daily.        Marland Kitchen FLUoxetine (PROZAC) 40 MG capsule Take 40 mg by mouth daily.        Marland Kitchen ibuprofen (ADVIL,MOTRIN) 800 MG tablet Take 800 mg by mouth every 8 (eight) hours as needed.        . metoprolol (TOPROL-XL) 100 MG 24 hr tablet take 1 tablet by mouth daily  90 tablet  2  . Multiple Vitamin (MULTIVITAMIN) tablet Take 1 tablet by mouth daily.        Marland Kitchen omeprazole (PRILOSEC) 40 MG capsule Take 40 mg by mouth daily.         Allergies  Allergen Reactions  . Achromycin (Tetracycline Hcl)     Dyspnea    . Penicillins     ? Facial angioedema  . Sulfamethoxazole W/Trimethoprim     hives  . Zanaflex (Tizanidine Hydrochloride)     Does not recall reaction type    Past Medical History  Diagnosis Date  . HTN (hypertension)   . Diverticulosis   . Fibromyalgia   . IBS (irritable bowel syndrome)   . Anxiety and depression   . GERD (gastroesophageal reflux disease)   . Gastritis   .  Arthritis   . Chronic sinusitis   . Lumbar radiculopathy   . Syncope   . Heart valve disease   . Obesity    Past Surgical History  Procedure Date  . Abdominal hysterectomy   . Appendectomy   . Cholecystectomy   . Breast lumpectomy     right  . Knee arthroscopy 2008    right  . Colonoscopy 05/27/2002    diverticulosis, hemorrhoids  . Upper gastrointestinal endoscopy 04/21/2009    gastritis  . Total hip arthroplasty     left       Review of Systems Fibromyalgia flare, right hip pain    Objective:   Physical Exam General: obese, NAD Eyes: anicteric Lungs: clear Heart: S1S2 no rubs, murmurs or gallops Abdomen: soft, BS+ tender epigastrium - mild Ext: no edema Rectal:  With female staff present there is a small amount of brown stool was trace heme positive. Normal resting tone. No perianal abnormality otherwise.          Assessment & Plan:  Melena  Story is good for this. Cause is  unclear. She is brown trace heme positive stool now. Her last CBC showed anemia in 2011 but that was after she had hip surgery and required transfusion.  Will check a CBC today and schedule her for an EGD and a colonoscopy. Risks benefits and indications are explained to understand and agrees to proceed. Given her prior problems with poor response to moderate sedation we used propofol deep sedation.

## 2011-07-28 NOTE — Progress Notes (Signed)
Quick Note:  CBC is normal - please let her know ______

## 2011-07-28 NOTE — Patient Instructions (Addendum)
You have been scheduled for an Endoscopy/Colonoscopy with separate instructions given. Please go to the basement upon leaving today to have your labs done. You have been given a Moviprep Kit. Your prescription(s) has(have) been sent to your pharmacy for you to pick up.

## 2011-08-05 ENCOUNTER — Other Ambulatory Visit: Payer: Self-pay | Admitting: Internal Medicine

## 2011-08-08 ENCOUNTER — Ambulatory Visit (AMBULATORY_SURGERY_CENTER): Payer: 59 | Admitting: Internal Medicine

## 2011-08-08 ENCOUNTER — Encounter: Payer: Self-pay | Admitting: Internal Medicine

## 2011-08-08 VITALS — BP 176/79 | HR 86 | Temp 100.3°F | Resp 22 | Ht 66.0 in | Wt 287.0 lb

## 2011-08-08 DIAGNOSIS — D126 Benign neoplasm of colon, unspecified: Secondary | ICD-10-CM

## 2011-08-08 DIAGNOSIS — K921 Melena: Secondary | ICD-10-CM

## 2011-08-08 DIAGNOSIS — Z86010 Personal history of colonic polyps: Secondary | ICD-10-CM

## 2011-08-08 DIAGNOSIS — K648 Other hemorrhoids: Secondary | ICD-10-CM

## 2011-08-08 DIAGNOSIS — K5730 Diverticulosis of large intestine without perforation or abscess without bleeding: Secondary | ICD-10-CM

## 2011-08-08 HISTORY — DX: Personal history of colonic polyps: Z86.010

## 2011-08-08 MED ORDER — ASPIRIN 325 MG PO TABS: 325.0000 mg | ORAL_TABLET | Freq: Every day | ORAL | Status: DC

## 2011-08-08 MED ORDER — IBUPROFEN 800 MG PO TABS: 800.0000 mg | ORAL_TABLET | Freq: Three times a day (TID) | ORAL | Status: DC | PRN

## 2011-08-08 MED ORDER — SODIUM CHLORIDE 0.9 % IV SOLN: 500.0000 mL | INTRAVENOUS | Status: DC

## 2011-08-08 NOTE — Patient Instructions (Signed)
Upper endoscopy demonstrated a small hiatal hernia. Colonoscopy revealed a polyp that was removed. I suspect it was benign. You also have severe diverticulosis and internal hemorrhoids. I will send a letter about the polyp results and the meaning as far as the next routine colonoscopy recommendation.  If you develop more dark black stools, we may need to look into the small intestine for a source of blood loss but I would not do that now.  Iva Boop, MD, [facility]

## 2011-08-08 NOTE — Progress Notes (Signed)
Kathy Bailey, CRNA administered propofol to sedate the pt for her EGD and colonoscopy. Maw  Having difficuility picking up sats on painted nails. Maw  Mark Smith,CRNA placed finger probe across the pt finger. maw  Pt tolerated the EGD exam very well. Maw  Pt tolerated the colonoscopy very well. Maw    Second bag of N.S. 0.9% 500 ml hung at 15:37. Maw  Patient did not experience any of the following events: a burn prior to discharge; a fall within the facility; wrong site/side/patient/procedure/implant event; or a hospital transfer or hospital admission upon discharge from the facility. [phone])  Patient did not have preoperative order for IV antibiotic SSI prophylaxis. [phone])

## 2011-08-09 ENCOUNTER — Telehealth: Payer: Self-pay | Admitting: *Deleted

## 2011-08-09 NOTE — Telephone Encounter (Signed)

## 2011-08-15 NOTE — Progress Notes (Signed)
Quick Note:  Advanced adenoma by size Repeat colonoscopy approx 07/2014 ______

## 2011-08-16 ENCOUNTER — Telehealth: Payer: Self-pay | Admitting: Internal Medicine

## 2011-08-16 DIAGNOSIS — K921 Melena: Secondary | ICD-10-CM

## 2011-08-16 NOTE — Telephone Encounter (Signed)
Patient advised.  She will come pick up cards and have CBC drawn

## 2011-08-16 NOTE — Telephone Encounter (Signed)
Hemoccults and CBC re: melena

## 2011-08-16 NOTE — Telephone Encounter (Signed)
Patient reports that she had a BM today that was black and tarry.  Endo/colon last week.  Dr Leone Payor she has held her anti-inflammatory as ordered.  Please advise.

## 2011-08-21 ENCOUNTER — Other Ambulatory Visit (INDEPENDENT_AMBULATORY_CARE_PROVIDER_SITE_OTHER): Payer: 59

## 2011-08-21 DIAGNOSIS — K921 Melena: Secondary | ICD-10-CM

## 2011-08-21 LAB — CBC WITH DIFFERENTIAL/PLATELET
Basophils Absolute: 0 10*3/uL (ref 0.0–0.1)
Basophils Relative: 0.1 % (ref 0.0–3.0)
Eosinophils Absolute: 0.1 10*3/uL (ref 0.0–0.7)
Eosinophils Relative: 2.4 % (ref 0.0–5.0)
HCT: 40 % (ref 36.0–46.0)
Hemoglobin: 13.6 g/dL (ref 12.0–15.0)
Lymphocytes Relative: 24.3 % (ref 12.0–46.0)
Lymphs Abs: 1.3 10*3/uL (ref 0.7–4.0)
MCHC: 34.1 g/dL (ref 30.0–36.0)
MCV: 90.6 fl (ref 78.0–100.0)
Monocytes Absolute: 0.4 10*3/uL (ref 0.1–1.0)
Monocytes Relative: 6.9 % (ref 3.0–12.0)
Neutro Abs: 3.5 10*3/uL (ref 1.4–7.7)
Neutrophils Relative %: 66.3 % (ref 43.0–77.0)
Platelets: 228 10*3/uL (ref 150.0–400.0)
RBC: 4.41 Mil/uL (ref 3.87–5.11)
RDW: 14.4 % (ref 11.5–14.6)
WBC: 5.3 10*3/uL (ref 4.5–10.5)

## 2011-09-05 ENCOUNTER — Other Ambulatory Visit: Payer: 59

## 2011-09-05 ENCOUNTER — Other Ambulatory Visit: Payer: Self-pay | Admitting: Internal Medicine

## 2011-09-05 DIAGNOSIS — Z1211 Encounter for screening for malignant neoplasm of colon: Secondary | ICD-10-CM

## 2011-09-05 LAB — HEMOCCULT SLIDES (X 3 CARDS)
Fecal Occult Blood: NEGATIVE
OCCULT 1: NEGATIVE
OCCULT 2: NEGATIVE
OCCULT 3: NEGATIVE
OCCULT 4: NEGATIVE
OCCULT 5: NEGATIVE

## 2011-09-05 NOTE — Progress Notes (Signed)
Quick Note:  No evidence for bleeding Suspect stool color changes are not blood based upon all available evidence Follow-up as needed ______

## 2011-09-21 ENCOUNTER — Telehealth: Payer: Self-pay

## 2011-09-21 NOTE — Telephone Encounter (Signed)
Spoke with patient, per MD: OV needed, patient stated she is out of town. Patient was offered to be seen by Dr.Hopper or one of the other physicians tomorrow, patient stated she is already balancing several things right now and she has 2 pending appointments tomorrow. Patient was then informed to go to urgent care and made aware of Saturday clinic hours  (also informed how to go about being seen there). Patient ok'd all information

## 2011-09-21 NOTE — Telephone Encounter (Signed)
Pt left a message stating she has chest congestion, head congestion, sore throat, and wheezing. Pt stated she doesn't have a fever and is out of town till tomorrow? Pt would like to know if something could be called in or does she need to make an appt for tomorrow? Please advise?

## 2011-09-23 ENCOUNTER — Ambulatory Visit (INDEPENDENT_AMBULATORY_CARE_PROVIDER_SITE_OTHER): Payer: 59 | Admitting: Internal Medicine

## 2011-09-23 ENCOUNTER — Encounter: Payer: Self-pay | Admitting: Internal Medicine

## 2011-09-23 VITALS — BP 162/100 | HR 83 | Temp 98.8°F | Wt 281.0 lb

## 2011-09-23 DIAGNOSIS — J3489 Other specified disorders of nose and nasal sinuses: Secondary | ICD-10-CM

## 2011-09-23 DIAGNOSIS — J40 Bronchitis, not specified as acute or chronic: Secondary | ICD-10-CM

## 2011-09-23 DIAGNOSIS — J069 Acute upper respiratory infection, unspecified: Secondary | ICD-10-CM

## 2011-09-23 MED ORDER — PREDNISONE 10 MG PO TABS: 10.0000 mg | ORAL_TABLET | Freq: Every day | ORAL | Status: DC

## 2011-09-23 MED ORDER — DOXYCYCLINE HYCLATE 100 MG PO TABS: 100.0000 mg | ORAL_TABLET | Freq: Two times a day (BID) | ORAL | Status: AC

## 2011-09-23 MED ORDER — PROMETHAZINE-CODEINE 6.25-10 MG/5ML PO SYRP: 5.0000 mL | ORAL_SOLUTION | ORAL | Status: AC | PRN

## 2011-09-23 NOTE — Progress Notes (Signed)
  Subjective:    Patient ID: Kathy [name], female    DOB: April 07, 1951, 61 y.o.   MRN: [id number]  HPI [name] reports 1 week h/o chest congestion with cough productive of clear mucus. She is having rhinorrhea that is also clear. The cough interferes with sleep. Maybe a low grade fever. No sick contacts. She has SOB and some wheezing. Positive for sinus pressure. Admits to mild nausea. She has taking some delsym but no other medications.   Medical provblem list reviewed.    Review of Systems System review is negative for any constitutional, cardiac, pulmonary, GI or neuro symptoms or complaints other than as described in the HPI.     Objective:   Physical Exam Filed Vitals:   09/23/11 1122  BP: 162/100  Pulse: 83  Temp: 98.8 F (37.1 C)  Pulse oximetry on room air is 96% Gen'l- obese white woman in no acute distress HEENT- sinus tenderness worse over maxillary sinus. TM's normal, throat clear Noed - negative Pulm - coarse rhonchi at right base, feint end-expiratory wheeze, coarse cough Cor - RRR          Assessment & Plan:  URI with sinus pressure and pain. Possible infection  Plan - sudafed 30 mg tid           Prom/cod 1 tsp q 6           Supportive care  Bronchitis - started with a cough and has a residual finding of rhonchi with slight wheeze Plan - doxy 100 mg bids x 7           Prednisone burst and taper.

## 2011-09-23 NOTE — Patient Instructions (Signed)
Bronchitis and sinus congestion as well. Plan - sudafed 30 mg three times a day; nasal saline wash; promethazine with codeine cough syrup 1 tsp every 6 hours; doxycycline 100 mg twice a day (antibiotic); prednisone burst and taper for mild wheezing - this may make you a little jittery and will drive your appetite. tylenol for fever, aches; hydrate; vitamin C  Call - for any drug reaction - rash, swelling, etc; for unremitting fever or cough; shortness of breath.   Acute Bronchitis You have acute bronchitis. This means you have a chest cold. The airways in your lungs are red and sore (inflamed). Acute means it is sudden onset.   CAUSES Bronchitis is most often caused by the same virus that causes a cold. SYMPTOMS    Body aches.     Chest congestion.     Chills.    Cough.    Fever.    Shortness of breath.     Sore throat.  TREATMENT   Acute bronchitis is usually treated with rest, fluids, and medicines for relief of fever or cough. Most symptoms should go away after a few days or a week. Increased fluids may help thin your secretions and will prevent dehydration. Your caregiver may give you an inhaler to improve your symptoms. The inhaler reduces shortness of breath and helps control cough. You can take over-the-counter pain relievers or cough medicine to decrease coughing, pain, or fever. A cool-air vaporizer may help thin bronchial secretions and make it easier to clear your chest. Antibiotics are usually not needed but can be prescribed if you smoke, are seriously ill, have chronic lung problems, are elderly, or you are at higher risk for developing complications. Allergies and asthma can make bronchitis worse. Repeated episodes of bronchitis may cause longstanding lung problems. Avoid smoking and secondhand smoke. Exposure to cigarette smoke or irritating chemicals will make bronchitis worse. If you are a cigarette smoker, consider using nicotine gum or skin patches to help control  withdrawal symptoms. Quitting smoking will help your lungs heal faster. Recovery from bronchitis is often slow, but you should start feeling better after 2 to 3 days. Cough from bronchitis frequently lasts for 3 to 4 weeks. To prevent another bout of acute bronchitis:  Quit smoking.     Wash your hands frequently to get rid of viruses or use a hand sanitizer.     Avoid other people with cold or virus symptoms.     Try not to touch your hands to your mouth, nose, or eyes.  SEEK IMMEDIATE MEDICAL CARE IF:  You develop increased fever, chills, or chest pain.     You have severe shortness of breath or bloody sputum.     You develop dehydration, fainting, repeated vomiting, or a severe headache.     You have no improvement after 1 week of treatment or you get worse.  MAKE SURE YOU:    Understand these instructions.     Will watch your condition.     Will get help right away if you are not doing well or get worse.  Document Released: 10/12/2004 Document Revised: 05/17/2011 Document Reviewed: 12/28/2010 [facility] Patient Information 2012 [place], [place].

## 2011-10-24 ENCOUNTER — Other Ambulatory Visit: Payer: Self-pay | Admitting: Obstetrics and Gynecology

## 2011-11-09 ENCOUNTER — Other Ambulatory Visit: Payer: Self-pay | Admitting: Internal Medicine

## 2012-01-05 ENCOUNTER — Ambulatory Visit (INDEPENDENT_AMBULATORY_CARE_PROVIDER_SITE_OTHER): Payer: 59 | Admitting: Internal Medicine

## 2012-01-05 ENCOUNTER — Encounter: Payer: Self-pay | Admitting: Internal Medicine

## 2012-01-05 VITALS — BP 132/80 | HR 73 | Temp 98.4°F | Resp 14 | Ht 66.25 in | Wt 266.2 lb

## 2012-01-05 DIAGNOSIS — I10 Essential (primary) hypertension: Secondary | ICD-10-CM

## 2012-01-05 DIAGNOSIS — M169 Osteoarthritis of hip, unspecified: Secondary | ICD-10-CM

## 2012-01-05 DIAGNOSIS — Z86718 Personal history of other venous thrombosis and embolism: Secondary | ICD-10-CM | POA: Insufficient documentation

## 2012-01-05 DIAGNOSIS — R7309 Other abnormal glucose: Secondary | ICD-10-CM

## 2012-01-05 DIAGNOSIS — Z01818 Encounter for other preprocedural examination: Secondary | ICD-10-CM

## 2012-01-05 LAB — LIPID PANEL
Cholesterol: 212 mg/dL — ABNORMAL HIGH (ref 0–200)
HDL: 60.3 mg/dL (ref >39.00)
Total CHOL/HDL Ratio: 4
Triglycerides: 124 mg/dL (ref 0.0–149.0)
VLDL: 24.8 mg/dL (ref 0.0–40.0)

## 2012-01-05 LAB — BASIC METABOLIC PANEL
BUN: 22 mg/dL (ref 6–23)
CO2: 30 mEq/L (ref 19–32)
Calcium: 9 mg/dL (ref 8.4–10.5)
Chloride: 103 mEq/L (ref 96–112)
Creatinine, Ser: 0.8 mg/dL (ref 0.4–1.2)
GFR: 83.76 mL/min (ref >60.00)
Glucose, Bld: 91 mg/dL (ref 70–99)
Potassium: 4.7 mEq/L (ref 3.5–5.1)
Sodium: 142 mEq/L (ref 135–145)

## 2012-01-05 LAB — LDL CHOLESTEROL, DIRECT: Direct LDL: 134.5 mg/dL

## 2012-01-05 LAB — ALT: ALT: 24 U/L (ref 0–35)

## 2012-01-05 LAB — AST: AST: 25 U/L (ref 0–37)

## 2012-01-05 LAB — HEMOGLOBIN A1C: Hgb A1c MFr Bld: 5.8 % (ref 4.6–6.5)

## 2012-01-05 LAB — TSH: TSH: 1.19 u[IU]/mL (ref 0.35–5.50)

## 2012-01-05 NOTE — Progress Notes (Signed)
Addended byMarga Melnick F on: 01/05/2012 09:01 AM   Modules accepted: Orders

## 2012-01-05 NOTE — Assessment & Plan Note (Signed)
Blood pressure appears to be well-controlled based on today's recording. Home monitor is indicated.  EKG does not reveal hypertensive changes. There is low voltage in the precordial leads which is related to body habitus and breast tissue. There is no evidence of strain or ischemia.

## 2012-01-05 NOTE — Progress Notes (Signed)
  Subjective:    Patient ID: Kathy [name], female    DOB: March 17, 1951, 61 y.o.   MRN: [id number]  HPI      Review of Systems  She describes increased stress related to her husband's infidelity. She states they celebrate their 39th wedding anniversary this weekend and will be going to the beach. She expresses being hurt but does not describe profound depression or suicide suicidality. Although she describes being a stress eater, the situation is forced her to make better dietary choices with associated weight loss as noted. She also states that he has started drinking again. She is not requesting any medication at this time       Objective:   Physical Exam        Assessment & Plan:  Attending surgeon is Dr. Despina Hick. He will be forwarded a copy of this note clearing her for surgery.  Referral to counseling will be completed if she agrees.

## 2012-01-05 NOTE — Progress Notes (Signed)
Subjective:    Patient ID: Kathy [name], female    DOB: August 12, 1951, 61 y.o.   MRN: [id number]  HPI Pre op evaluation: Surgical diagnosis: Advanced, end-stage osteoarthritis R hip Tentative surgical date/Surgeon: Severity: Pain level is up to a 10 Pain: Described as sharp but variable, occurring intermittently every day. It radiates from the hip to her foot Activity of daily living limitation/impairment of function: She avoids stairs if at all possible. She is unable to complete most house hold tasks such as vacuuming. She uses a cane for ambulation Treatment to date, efficacy: She's had 2 steroid injections with only temporary relief. She's also been on narcotic pain medication with suboptimal response Significant past medical history: DVT post knee arthroscopy ? 2007;  hypertension; GERD  Review of Systems CHRONIC HYPERTENSION: Disease Monitoring  Blood pressure range: not monitored regularly  Chest pain: no   Dyspnea: no   Claudication: no Medication compliance: yes  Medication Side Effects  Lightheadedness: no  Urinary frequency:noctiria 2-3 X/ night  Edema: no Preventitive Healthcare:  Exercise: unable due to hip  Diet Pattern:no plan , "better choices" with 25 # weight loss  Salt Restriction: yes  She has a history of IBS as well as reflux. She denies swallowing issues, hoarseness, melena, or rectal bleeding. Her endoscopic evaluations are up to date  As noted she does have nocturia; she's been told that she does have significant snoring but no apnea       Objective:   Physical Exam Gen.:  well-nourished in appearance; weight excess. Alert, appropriate and cooperative throughout exam.  Eyes: No corneal or conjunctival inflammation noted. No icterus Ears: External  ear exam reveals no significant lesions or deformities. Canals clear .TMs normal.  Nose: External nasal exam reveals no deformity or inflammation. Nasal mucosa are pink and moist. No lesions or exudates  noted.  Mouth: Oral mucosa and oropharynx reveal no lesions or exudates. Teeth in good repair. Neck: No deformities, masses, or tenderness noted.  Thyroid normal. Lungs: Normal respiratory effort; chest expands symmetrically. Lungs are clear to auscultation without rales, wheezes, or increased work of breathing. Heart: Normal rate and rhythm. Normal S1 and S2. No gallop, click, or rub. S4 w/o murmur. Abdomen: Bowel sounds normal; abdomen soft and nontender. No masses, organomegaly or hernias noted. Musculoskeletal/extremities: No deformity or scoliosis noted of  the thoracic or lumbar spine. No clubbing, cyanosis, edema, or deformity noted. Pain with range of motion  R hip .Tone & strength  normal.Joints normal. Nail health  good. Vascular: Carotid, radial artery, dorsalis pedis and  posterior tibial pulses are full and equal. No bruits present. Neurologic: Alert and oriented x3. Deep tendon reflexes symmetrical but 0 + @ knees.          Skin: Intact without suspicious lesions or rashes. Lymph: No cervical, axillary lymphadenopathy present. Psych: Mood and affect are normal. Normally interactive                                                                                         Assessment & Plan:  #1 advanced, degenerative disease of the right hip. Medically she is cleared for the surgery.  Because of history of nocturia and snoring she will need to be monitored closely for sleep apnea.  Perioperative anticoagulation is indicated; specific regimen is deferred to her surgeon.

## 2012-01-05 NOTE — Patient Instructions (Signed)
Blood Pressure Goal  Ideally is an AVERAGE < 135/85. This AVERAGE should be calculated from @ least 5-7 BP readings taken @ different times of day on different days of week. You should not respond to isolated BP readings , but rather the AVERAGE for that week .Share results with Dr Devoria Albe staff; you are cleared for surgery.

## 2012-01-05 NOTE — Assessment & Plan Note (Signed)
She received Coumadin postoperatively following her left total hip replacement.

## 2012-01-07 ENCOUNTER — Other Ambulatory Visit: Payer: Self-pay | Admitting: Orthopedic Surgery

## 2012-01-07 MED ORDER — BUPIVACAINE LIPOSOME 1.3 % IJ SUSP: 20.0000 mL | Freq: Once | INTRAMUSCULAR | Status: DC

## 2012-01-07 MED ORDER — DEXAMETHASONE SODIUM PHOSPHATE 10 MG/ML IJ SOLN: 10.0000 mg | Freq: Once | INTRAMUSCULAR | Status: DC

## 2012-02-23 NOTE — H&P (Signed)
Kathy [name] DOB: [birth year]  Chief Complaint: right hip pain   History of Present Illness The patient is a 61 year old female who comes in today for a preoperative History and Physical. The patient is scheduled for a right total hip arthroplasty to be performed by Dr. Gus Rankin. [name], MD at [facility] on Monday March 18, 2012. Kathy [name] has had pain in her right hip for several months. Unfortunately, her pain is getting progressively worse. The pain in her hip and radiates to the knee. She had an intra-articular hip injection over three months ago and this did help but for a limited period of time. The pain is in the groin, radiating to her knee. She is having lateral thigh pain also. She has lost more motion in her hip. Due to failure of conservative measures, the most predictable means for increased function and decreased pain in the right hip is a right total hip arthroplasty. Risks and benefits of the surgery discussed.  PCP: Dr. Marga Melnick Rheum: Dr. Azzie Roup Gastric: Dr. Elenor Quinones   Problem List/Past MedicalHistory Osteoarthritis, Hip (715.35) Osteoarthritis, Knee (715.96) Pain in joint, pelvis/thigh (719.45). 05/05/2010 Contusion, foot (924.20). 07/28/2010 Migraine Headache Anxiety/Depression Hypertension Heart murmur Hiatal Hernia Gastroesophageal Reflux Disease Hemorrhoids Diverticulosis Kidney Stone. none since 1981 DVT. after knee scope Menopause  Allergies Penicillins Zanaflex * Sulfa Drugs Nickel Acromycin   Family History Mother. deceased age 58 due to cancer, heart disease, COPD, MI Father. age 69, MI, DM Cerebrovascular Accident. Paternal Grandfather.   Social History Alcohol use. Occasional alcohol use. Tobacco use. Never smoker. Marital status Children. 2 Post-Surgical Plans. going home: caregiver- husband [employer] officer. living will, healthcare POA   Medication History Norco  (5-325MG  Tablet, 1-2 Oral po q8h prn pain, Taken starting 02/05/2012) Active. ALPRAZolam (1MG  Tablet, Oral three times daily, as needed) Active. Cyclobenzaprine HCl (10MG  Tablet, Oral daily) Active. Omeprazole (40MG  Capsule DR, Oral daily) Active. Benicar (40MG  Tablet, Oral daily) Active. Metoprolol Succinate (100MG  Tablet ER, Oral daily) Active. PROzac (40MG  Capsule, Oral daily) Active. Caltrate 600+D Plus ( Oral) Specific dose unknown - Active. Aspirin EC (325MG  Tablet DR, Oral daily) Active. Calcium 600 (1500MG  Tablet, Oral) Active. Ibuprofen (800MG  Tablet, Oral) Active. Multiple Vitamin ( Oral daily) Active.   Past Surgical History Appendectomy. 1970 Hysterectomy. 1993 Cholecystectomy. 1993 Hip Replacement, Total. left 2011   Review of Systems General:Present- Night Sweats (menopause). Not Present- Chills, Fever, Fatigue, Weight Gain, Weight Loss and Memory Loss. Skin:Not Present- Hives, Itching, Rash, Eczema and Lesions. HEENT:Present- Headache. Not Present- Tinnitus, Double Vision, Visual Loss, Hearing Loss and Dentures. Respiratory:Present- Allergies. Not Present- Shortness of breath with exertion, Shortness of breath at rest, Coughing up blood and Chronic Cough. Cardiovascular:Not Present- Chest Pain, Racing/skipping heartbeats, Difficulty Breathing Lying Down, Murmur, Swelling and Palpitations. Gastrointestinal:Present- Heartburn. Not Present- Bloody Stool, Abdominal Pain, Vomiting, Nausea, Constipation, Diarrhea, Difficulty Swallowing, Jaundice and Loss of appetitie. Female Genitourinary:Not Present- Blood in Urine, Urinary frequency, Weak urinary stream, Discharge, Flank Pain, Incontinence, Painful Urination, Urgency, Urinary Retention and Urinating at Night. Musculoskeletal:Present- Muscle Weakness, Muscle Pain, Joint Swelling, Joint Pain, Back Pain, Morning Stiffness and Spasms. Neurological:Not Present- Tremor, Dizziness, Blackout spells, Paralysis,  Difficulty with balance and Weakness. Psychiatric:Not Present- Insomnia.   Vitals Weight: 260 lb Height: 66 in Body Surface Area: 2.34 m Body Mass Index: 41.96 kg/m Pulse: 80 (Regular) Resp.: 16 (Unlabored) BP: 132/82 (Sitting, Left Arm, Standard)    Physical Exam General Mental Status - Alert, cooperative and good historian. General Appearance- pleasant.  Not in acute distress. Orientation- Oriented X3. Build & Nutrition- Well nourished and Well developed. Head and Neck Head- normocephalic, atraumatic . Neck Global Assessment- supple. no bruit auscultated on the right and no bruit auscultated on the left. Eye Pupil- Bilateral- Normal. Motion- Bilateral- EOMI. Chest and Lung Exam Auscultation: Breath sounds:- clear at anterior chest wall and - clear at posterior chest wall. Adventitious sounds:- No Adventitious sounds. Cardiovascular Auscultation:Rhythm- Regular rate and rhythm. Heart Sounds- S1 WNL and S2 WNL. Murmurs & Other Heart Sounds:Midsystolic Click- Aortic Area. Abdomen Palpation/Percussion:Tenderness- Abdomen is non-tender to palpation. Rigidity (guarding)- Abdomen is soft. Auscultation:Auscultation of the abdomen reveals - Bowel sounds normal. Female Genitourinary Not done, not pertinent to present illness Peripheral Vascular Upper Extremity: Palpation:- Pulses bilaterally normal. Lower Extremity: Palpation:- Pulses bilaterally normal. Neurologic Examination of related systems reveals - normal muscle strength and tone in all extremities. Neurologic evaluation reveals - normal sensation and upper and lower extremity deep tendon reflexes intact bilaterally . Musculoskeletal The right hip can be flexed to 90. No internal rotation and about 20 external rotation and 20-30 abduction. The left hip flexion to 120, rotation in 30 , out 40 and abduction to 40 without discomfort.    RADIOGRAPHS: Review of plain  radiographs from about a month ago show she has a pretty significant jointspace narrowing in the right hip. Compared these to X-rays taken in our office in November and there is definitely a change with less jointspace now than there was in November    Assessment & Plan Osteoarthritis, Hip (715.35) Right total hip arthroplasty      Dimitri Ped, PA-C

## 2012-03-07 ENCOUNTER — Encounter (HOSPITAL_COMMUNITY): Payer: Self-pay | Admitting: Pharmacy Technician

## 2012-03-11 ENCOUNTER — Encounter (HOSPITAL_COMMUNITY): Payer: Self-pay

## 2012-03-11 ENCOUNTER — Ambulatory Visit (HOSPITAL_COMMUNITY)
Admission: RE | Admit: 2012-03-11 | Discharge: 2012-03-11 | Disposition: A | Discharge status: Home / Self Care | Payer: 59 | Source: Ambulatory Visit | Attending: Orthopedic Surgery | Admitting: Orthopedic Surgery

## 2012-03-11 ENCOUNTER — Encounter (HOSPITAL_COMMUNITY)
Admission: RE | Admit: 2012-03-11 | Discharge: 2012-03-11 | Disposition: A | Discharge status: Home / Self Care | Payer: 59 | Source: Ambulatory Visit | Attending: Orthopedic Surgery | Admitting: Orthopedic Surgery

## 2012-03-11 DIAGNOSIS — M169 Osteoarthritis of hip, unspecified: Secondary | ICD-10-CM | POA: Insufficient documentation

## 2012-03-11 DIAGNOSIS — M1610 Unilateral primary osteoarthritis, unspecified hip: Secondary | ICD-10-CM | POA: Insufficient documentation

## 2012-03-11 DIAGNOSIS — Z01812 Encounter for preprocedural laboratory examination: Secondary | ICD-10-CM | POA: Insufficient documentation

## 2012-03-11 DIAGNOSIS — Z01818 Encounter for other preprocedural examination: Secondary | ICD-10-CM | POA: Insufficient documentation

## 2012-03-11 HISTORY — DX: Headache: R51

## 2012-03-11 HISTORY — DX: Depression, unspecified: F32.A

## 2012-03-11 HISTORY — DX: Cardiac murmur, unspecified: R01.1

## 2012-03-11 HISTORY — DX: Personal history of other diseases of the digestive system: Z87.19

## 2012-03-11 HISTORY — DX: Anxiety disorder, unspecified: F41.9

## 2012-03-11 HISTORY — DX: Major depressive disorder, single episode, unspecified: F32.9

## 2012-03-11 LAB — COMPREHENSIVE METABOLIC PANEL
ALT: 17 U/L (ref 0–35)
AST: 18 U/L (ref 0–37)
Albumin: 4.2 g/dL (ref 3.5–5.2)
Alkaline Phosphatase: 71 U/L (ref 39–117)
BUN: 15 mg/dL (ref 6–23)
CO2: 30 mEq/L (ref 19–32)
Calcium: 9.3 mg/dL (ref 8.4–10.5)
Chloride: 100 mEq/L (ref 96–112)
Creatinine, Ser: 0.89 mg/dL (ref 0.50–1.10)
GFR calc Af Amer: 80 mL/min — ABNORMAL LOW (ref >90)
GFR calc non Af Amer: 69 mL/min — ABNORMAL LOW (ref >90)
Glucose, Bld: 82 mg/dL (ref 70–99)
Potassium: 3.8 mEq/L (ref 3.5–5.1)
Sodium: 139 mEq/L (ref 135–145)
Total Bilirubin: 0.5 mg/dL (ref 0.3–1.2)
Total Protein: 7.1 g/dL (ref 6.0–8.3)

## 2012-03-11 LAB — CBC
HCT: 41.4 % (ref 36.0–46.0)
Hemoglobin: 13.3 g/dL (ref 12.0–15.0)
MCH: 30 pg (ref 26.0–34.0)
MCHC: 32.1 g/dL (ref 30.0–36.0)
MCV: 93.5 fL (ref 78.0–100.0)
Platelets: 209 10*3/uL (ref 150–400)
RBC: 4.43 MIL/uL (ref 3.87–5.11)
RDW: 14.5 % (ref 11.5–15.5)
WBC: 4.4 10*3/uL (ref 4.0–10.5)

## 2012-03-11 LAB — PROTIME-INR
INR: 1.01 (ref 0.00–1.49)
Prothrombin Time: 13.5 seconds (ref 11.6–15.2)

## 2012-03-11 LAB — APTT: aPTT: 32 seconds (ref 24–37)

## 2012-03-11 LAB — SURGICAL PCR SCREEN
MRSA, PCR: NEGATIVE
Staphylococcus aureus: NEGATIVE

## 2012-03-11 LAB — URINALYSIS, ROUTINE W REFLEX MICROSCOPIC
Glucose, UA: NEGATIVE mg/dL
Hgb urine dipstick: NEGATIVE
Ketones, ur: NEGATIVE mg/dL
Leukocytes, UA: NEGATIVE
Nitrite: NEGATIVE
Protein, ur: NEGATIVE mg/dL
Specific Gravity, Urine: 1.028 (ref 1.005–1.030)
Urobilinogen, UA: 0.2 mg/dL (ref 0.0–1.0)
pH: 5.5 (ref 5.0–8.0)

## 2012-03-11 NOTE — Patient Instructions (Addendum)
YOUR SURGERY IS SCHEDULED ON:  Monday 7/1  AT 3:15 PM  REPORT TO [place] [facility] AT:  12:45 PM      PHONE # FOR SHORT STAY IS [phone]  DO NOT EAT  ANYTHING AFTER MIDNIGHT THE NIGHT BEFORE YOUR SURGERY.  NO FOOD, NO CHEWING GUM, NO MINTS, NO CANDIES, NO CHEWING TOBACCO.  YOU MAY HAVE CLEAR LIQUIDS TO DRINK FROM MIDNIGHT UNTIL 9:00 AM THE DAY OF YOUR SURGERY--WATER, TEA.  NOTHING TO DRINK AFTER 9:00 AM THE DAY OF SURGERY.  PLEASE TAKE THE FOLLOWING MEDICATIONS THE AM OF YOUR SURGERY WITH A FEW SIPS OF WATER:  METOPROLOL, OMEPRAZOLE, ALPRAZOLAM.  TAKE VICODIN IF NEEDED.    IF YOU USE INHALERS--USE YOUR INHALERS THE AM OF YOUR SURGERY AND BRING INHALERS TO THE HOSPITAL -TAKE TO SURGERY.    IF YOU ARE DIABETIC:  DO NOT TAKE ANY DIABETIC MEDICATIONS THE AM OF YOUR SURGERY.  IF YOU TAKE INSULIN IN THE EVENINGS--PLEASE ONLY TAKE 1/2 NORMAL EVENING DOSE THE NIGHT BEFORE YOUR SURGERY.  NO INSULIN THE AM OF YOUR SURGERY.  IF YOU HAVE SLEEP APNEA AND USE CPAP OR BIPAP--PLEASE BRING THE MASK --NOT THE MACHINE-NOT THE TUBING   -JUST THE MASK. DO NOT BRING VALUABLES, MONEY, CREDIT CARDS.  CONTACT LENS, DENTURES / PARTIALS, GLASSES SHOULD NOT BE WORN TO SURGERY AND IN MOST CASES-HEARING AIDS WILL NEED TO BE REMOVED.  BRING YOUR GLASSES CASE, ANY EQUIPMENT NEEDED FOR YOUR CONTACT LENS. FOR PATIENTS ADMITTED TO THE HOSPITAL--CHECK OUT TIME THE DAY OF DISCHARGE IS 11:00 AM.  ALL INPATIENT ROOMS ARE PRIVATE - WITH BATHROOM, TELEPHONE, TELEVISION AND WIFI INTERNET. IF YOU ARE BEING DISCHARGED THE SAME DAY OF YOUR SURGERY--YOU CAN NOT DRIVE YOURSELF HOME--AND SHOULD NOT GO HOME ALONE BY TAXI OR BUS.  NO DRIVING OR OPERATING MACHINERY FOR 24 HOURS FOLLOWING ANESTHESIA / PAIN MEDICATIONS.                            SPECIAL INSTRUCTIONS:  CHLORHEXIDINE SOAP SHOWER (other brand names are Betasept and Hibiclens ) PLEASE SHOWER WITH CHLORHEXIDINE THE NIGHT BEFORE YOUR SURGERY AND THE AM OF YOUR  SURGERY. DO NOT USE CHLORHEXIDINE ON YOUR FACE OR PRIVATE AREAS--YOU MAY USE YOUR NORMAL SOAP THOSE AREAS AND YOUR NORMAL SHAMPOO.  WOMEN SHOULD AVOID SHAVING UNDER ARMS AND SHAVING LEGS 48 HOURS BEFORE USING CHLORHEXIDINE TO AVOID SKIN IRRITATION.  DO NOT USE IF ALLERGIC TO CHLORHEXIDINE.  PLEASE READ OVER ANY  FACT SHEETS THAT YOU WERE GIVEN: MRSA INFORMATION, BLOOD TRANSFUSION INFORMATION, INCENTIVE SPIROMETER INFORMATION.   PT NOTIFIED SURGERY TIME MOVED TO 2:10 PM ON Monday 7/1--SHE SHOULD ARRIVE TO SHORT STAY BY 11:30 AM AND HER CLEAR LIQUIDS ARE UNTIL 8:00 AM DAY OF SURGERY.

## 2012-03-11 NOTE — Pre-Procedure Instructions (Signed)
PT HAS EKG REPORT FROM DR. W. HOPPER --DONE 01/05/12- REPORT IN EPIC AND COPY ON PT'S CHART. CBC, CMET, PT, PTT, UA, T/S AND CXR WERE DONE TODAY AT [facility] PREOP-AS PER ORDERS DR. Lequita Halt AND ANESTHESIOLOGIST GUIDELINES. PREOP INSTRUCTIONS DISCUSSED WITH PT USING TEACHBACK METHOD.

## 2012-03-12 NOTE — Progress Notes (Signed)
03/11/12 1627  OBSTRUCTIVE SLEEP APNEA  Have you ever been diagnosed with sleep apnea through a sleep study? No  Do you snore loudly (loud enough to be heard through closed doors)?  0  Do you often feel tired, fatigued, or sleepy during the daytime? 1  Has anyone observed you stop breathing during your sleep? 0  Do you have, or are you being treated for high blood pressure? 1  BMI more than 35 kg/m2? 1  Age over 60 years old? 1  Neck circumference greater than 40 cm/18 inches? 0  Gender: 0  Obstructive Sleep Apnea Score 4   Score 4 or greater  Updated health history

## 2012-03-18 ENCOUNTER — Encounter (HOSPITAL_COMMUNITY): Payer: Self-pay | Admitting: Orthopedic Surgery

## 2012-03-18 ENCOUNTER — Inpatient Hospital Stay (HOSPITAL_COMMUNITY): Payer: 59

## 2012-03-18 ENCOUNTER — Encounter (HOSPITAL_COMMUNITY): Payer: Self-pay | Admitting: Anesthesiology

## 2012-03-18 ENCOUNTER — Ambulatory Visit (HOSPITAL_COMMUNITY): Payer: 59 | Admitting: Anesthesiology

## 2012-03-18 ENCOUNTER — Encounter (HOSPITAL_COMMUNITY)
Admission: RE | Disposition: A | Discharge status: Home Health | Payer: Self-pay | Source: Ambulatory Visit | Attending: Orthopedic Surgery

## 2012-03-18 ENCOUNTER — Encounter (HOSPITAL_COMMUNITY): Payer: Self-pay | Admitting: *Deleted

## 2012-03-18 ENCOUNTER — Inpatient Hospital Stay (HOSPITAL_COMMUNITY)
Admission: RE | Admit: 2012-03-18 | Discharge: 2012-03-21 | DRG: 470 | Disposition: A | Discharge status: Home Health | Payer: 59 | Source: Ambulatory Visit | Attending: Orthopedic Surgery | Admitting: Orthopedic Surgery

## 2012-03-18 DIAGNOSIS — K219 Gastro-esophageal reflux disease without esophagitis: Secondary | ICD-10-CM | POA: Diagnosis present

## 2012-03-18 DIAGNOSIS — F411 Generalized anxiety disorder: Secondary | ICD-10-CM | POA: Diagnosis present

## 2012-03-18 DIAGNOSIS — M1610 Unilateral primary osteoarthritis, unspecified hip: Principal | ICD-10-CM | POA: Diagnosis present

## 2012-03-18 DIAGNOSIS — I10 Essential (primary) hypertension: Secondary | ICD-10-CM | POA: Diagnosis present

## 2012-03-18 DIAGNOSIS — Z6841 Body Mass Index (BMI) 40.0 and over, adult: Secondary | ICD-10-CM

## 2012-03-18 DIAGNOSIS — M169 Osteoarthritis of hip, unspecified: Secondary | ICD-10-CM | POA: Diagnosis present

## 2012-03-18 DIAGNOSIS — Z96649 Presence of unspecified artificial hip joint: Secondary | ICD-10-CM

## 2012-03-18 HISTORY — PX: TOTAL HIP ARTHROPLASTY: SHX124

## 2012-03-18 LAB — TYPE AND SCREEN
ABO/RH(D): O POS
Antibody Screen: NEGATIVE

## 2012-03-18 SURGERY — ARTHROPLASTY, HIP, TOTAL,POSTERIOR APPROACH
Anesthesia: General | Site: Hip | Laterality: Right | Wound class: Clean

## 2012-03-18 MED ORDER — IRBESARTAN 300 MG PO TABS
300.0000 mg | ORAL_TABLET | Freq: Every day | ORAL | Status: DC
  Administered 2012-03-18 – 2012-03-21 (×4): 300 mg via ORAL
  Filled 2012-03-18 (×4): qty 1

## 2012-03-18 MED ORDER — HYDROMORPHONE HCL PF 1 MG/ML IJ SOLN
INTRAMUSCULAR | Status: DC | PRN
  Administered 2012-03-18: .25 mg via INTRAVENOUS
  Administered 2012-03-18: 0.5 mg via INTRAVENOUS
  Administered 2012-03-18: .25 mg via INTRAVENOUS

## 2012-03-18 MED ORDER — ACETAMINOPHEN 325 MG PO TABS: 650.0000 mg | ORAL_TABLET | Freq: Four times a day (QID) | ORAL | Status: DC | PRN

## 2012-03-18 MED ORDER — PROPOFOL 10 MG/ML IV EMUL
INTRAVENOUS | Status: DC | PRN
  Administered 2012-03-18: 70 mg via INTRAVENOUS
  Administered 2012-03-18: 200 mg via INTRAVENOUS

## 2012-03-18 MED ORDER — VANCOMYCIN HCL 1000 MG IV SOLR
1500.0000 mg | INTRAVENOUS | Status: AC
  Administered 2012-03-18: 1500 mg via INTRAVENOUS
  Filled 2012-03-18: qty 1500

## 2012-03-18 MED ORDER — DOCUSATE SODIUM 100 MG PO CAPS
100.0000 mg | ORAL_CAPSULE | Freq: Two times a day (BID) | ORAL | Status: DC
  Administered 2012-03-18 – 2012-03-21 (×6): 100 mg via ORAL

## 2012-03-18 MED ORDER — HYDROMORPHONE HCL PF 1 MG/ML IJ SOLN
INTRAMUSCULAR | Status: AC
  Filled 2012-03-18: qty 1

## 2012-03-18 MED ORDER — VANCOMYCIN HCL IN DEXTROSE 1-5 GM/200ML-% IV SOLN
1000.0000 mg | Freq: Two times a day (BID) | INTRAVENOUS | Status: AC
  Administered 2012-03-19: 1000 mg via INTRAVENOUS
  Filled 2012-03-18: qty 200

## 2012-03-18 MED ORDER — ROCURONIUM BROMIDE 100 MG/10ML IV SOLN
INTRAVENOUS | Status: DC | PRN
  Administered 2012-03-18: 50 mg via INTRAVENOUS

## 2012-03-18 MED ORDER — METOPROLOL SUCCINATE ER 100 MG PO TB24
100.0000 mg | ORAL_TABLET | Freq: Every day | ORAL | Status: DC
  Administered 2012-03-19 – 2012-03-21 (×3): 100 mg via ORAL
  Filled 2012-03-18 (×3): qty 1

## 2012-03-18 MED ORDER — FLUOXETINE HCL 40 MG PO CAPS: 40.0000 mg | ORAL_CAPSULE | Freq: Every day | ORAL | Status: DC

## 2012-03-18 MED ORDER — OXYCODONE HCL 5 MG PO TABS
5.0000 mg | ORAL_TABLET | ORAL | Status: DC | PRN
  Administered 2012-03-18: 5 mg via ORAL
  Administered 2012-03-19 – 2012-03-21 (×15): 10 mg via ORAL
  Filled 2012-03-18 (×2): qty 2
  Filled 2012-03-18: qty 1
  Filled 2012-03-18 (×13): qty 2

## 2012-03-18 MED ORDER — ONDANSETRON HCL 4 MG/2ML IJ SOLN: 4.0000 mg | Freq: Four times a day (QID) | INTRAMUSCULAR | Status: DC | PRN

## 2012-03-18 MED ORDER — DIPHENHYDRAMINE HCL 12.5 MG/5ML PO ELIX: 12.5000 mg | ORAL_SOLUTION | ORAL | Status: DC | PRN

## 2012-03-18 MED ORDER — MORPHINE SULFATE 2 MG/ML IJ SOLN
1.0000 mg | INTRAMUSCULAR | Status: DC | PRN
  Administered 2012-03-18 – 2012-03-19 (×2): 2 mg via INTRAVENOUS
  Administered 2012-03-19: 1 mg via INTRAVENOUS
  Administered 2012-03-20 (×2): 2 mg via INTRAVENOUS
  Filled 2012-03-18 (×5): qty 1

## 2012-03-18 MED ORDER — EPHEDRINE SULFATE 50 MG/ML IJ SOLN
INTRAMUSCULAR | Status: DC | PRN
  Administered 2012-03-18 (×4): 5 mg via INTRAVENOUS
  Administered 2012-03-18: 10 mg via INTRAVENOUS
  Administered 2012-03-18 (×3): 5 mg via INTRAVENOUS

## 2012-03-18 MED ORDER — ACETAMINOPHEN 10 MG/ML IV SOLN
INTRAVENOUS | Status: AC
Start: 2012-03-18 — End: 2012-03-18
  Filled 2012-03-18: qty 100

## 2012-03-18 MED ORDER — ALPRAZOLAM 1 MG PO TABS
1.0000 mg | ORAL_TABLET | Freq: Four times a day (QID) | ORAL | Status: DC | PRN
  Administered 2012-03-19 – 2012-03-20 (×6): 1 mg via ORAL
  Filled 2012-03-18 (×6): qty 1

## 2012-03-18 MED ORDER — CHLORHEXIDINE GLUCONATE 4 % EX LIQD
60.0000 mL | Freq: Once | CUTANEOUS | Status: DC
  Filled 2012-03-18: qty 60

## 2012-03-18 MED ORDER — LACTATED RINGERS IV SOLN
INTRAVENOUS | Status: DC
  Administered 2012-03-18: 1000 mL via INTRAVENOUS
  Administered 2012-03-18: 16:00:00 via INTRAVENOUS
  Administered 2012-03-18: 1000 mL via INTRAVENOUS
  Administered 2012-03-18: 15:00:00 via INTRAVENOUS

## 2012-03-18 MED ORDER — BISACODYL 10 MG RE SUPP: 10.0000 mg | Freq: Every day | RECTAL | Status: DC | PRN

## 2012-03-18 MED ORDER — METOCLOPRAMIDE HCL 5 MG/ML IJ SOLN
INTRAMUSCULAR | Status: DC | PRN
  Administered 2012-03-18: 10 mg via INTRAVENOUS

## 2012-03-18 MED ORDER — KCL IN DEXTROSE-NACL 20-5-0.9 MEQ/L-%-% IV SOLN
INTRAVENOUS | Status: DC
  Administered 2012-03-19: 20 mL/h via INTRAVENOUS
  Filled 2012-03-18 (×3): qty 1000

## 2012-03-18 MED ORDER — HYDROMORPHONE HCL PF 1 MG/ML IJ SOLN
0.2500 mg | INTRAMUSCULAR | Status: DC | PRN
  Administered 2012-03-18 (×2): 0.5 mg via INTRAVENOUS
  Administered 2012-03-18: 0.25 mg via INTRAVENOUS
  Administered 2012-03-18: 0.5 mg via INTRAVENOUS

## 2012-03-18 MED ORDER — METOCLOPRAMIDE HCL 5 MG/ML IJ SOLN: 5.0000 mg | Freq: Three times a day (TID) | INTRAMUSCULAR | Status: DC | PRN

## 2012-03-18 MED ORDER — 0.9 % SODIUM CHLORIDE (POUR BTL) OPTIME
TOPICAL | Status: DC | PRN
  Administered 2012-03-18: 1000 mL

## 2012-03-18 MED ORDER — LIDOCAINE HCL (CARDIAC) 20 MG/ML IV SOLN
INTRAVENOUS | Status: DC | PRN
  Administered 2012-03-18: 50 mg via INTRAVENOUS

## 2012-03-18 MED ORDER — ACETAMINOPHEN 650 MG RE SUPP: 650.0000 mg | Freq: Four times a day (QID) | RECTAL | Status: DC | PRN

## 2012-03-18 MED ORDER — ONDANSETRON HCL 4 MG/2ML IJ SOLN
INTRAMUSCULAR | Status: DC | PRN
  Administered 2012-03-18: 4 mg via INTRAVENOUS

## 2012-03-18 MED ORDER — FLUOXETINE HCL 20 MG PO CAPS
40.0000 mg | ORAL_CAPSULE | Freq: Every day | ORAL | Status: DC
  Administered 2012-03-18 – 2012-03-20 (×3): 40 mg via ORAL
  Filled 2012-03-18 (×4): qty 2

## 2012-03-18 MED ORDER — KETAMINE HCL 10 MG/ML IJ SOLN
INTRAMUSCULAR | Status: DC | PRN
  Administered 2012-03-18: 2.5 mg via INTRAVENOUS
  Administered 2012-03-18 (×2): 5 mg via INTRAVENOUS
  Administered 2012-03-18: 2.5 mg via INTRAVENOUS
  Administered 2012-03-18: 5 mg via INTRAVENOUS
  Administered 2012-03-18: 20 mg via INTRAVENOUS
  Administered 2012-03-18: 10 mg via INTRAVENOUS

## 2012-03-18 MED ORDER — PHENOL 1.4 % MT LIQD: 1.0000 | OROMUCOSAL | Status: DC | PRN

## 2012-03-18 MED ORDER — PHENYLEPHRINE HCL 10 MG/ML IJ SOLN
INTRAMUSCULAR | Status: DC | PRN
  Administered 2012-03-18 (×2): 40 ug via INTRAVENOUS

## 2012-03-18 MED ORDER — PANTOPRAZOLE SODIUM 40 MG PO TBEC
80.0000 mg | DELAYED_RELEASE_TABLET | Freq: Every day | ORAL | Status: DC
  Filled 2012-03-18: qty 2

## 2012-03-18 MED ORDER — MIDAZOLAM HCL 5 MG/5ML IJ SOLN
INTRAMUSCULAR | Status: DC | PRN
  Administered 2012-03-18: 2 mg via INTRAVENOUS

## 2012-03-18 MED ORDER — ACETAMINOPHEN 10 MG/ML IV SOLN
1000.0000 mg | Freq: Four times a day (QID) | INTRAVENOUS | Status: AC
  Administered 2012-03-18 – 2012-03-19 (×4): 1000 mg via INTRAVENOUS
  Filled 2012-03-18 (×6): qty 100

## 2012-03-18 MED ORDER — FENTANYL CITRATE 0.05 MG/ML IJ SOLN
INTRAMUSCULAR | Status: DC | PRN
  Administered 2012-03-18: 50 ug via INTRAVENOUS
  Administered 2012-03-18: 25 ug via INTRAVENOUS
  Administered 2012-03-18 (×4): 50 ug via INTRAVENOUS
  Administered 2012-03-18: 25 ug via INTRAVENOUS
  Administered 2012-03-18: 50 ug via INTRAVENOUS

## 2012-03-18 MED ORDER — PROMETHAZINE HCL 25 MG/ML IJ SOLN: 6.2500 mg | INTRAMUSCULAR | Status: DC | PRN

## 2012-03-18 MED ORDER — MENTHOL 3 MG MT LOZG: 1.0000 | LOZENGE | OROMUCOSAL | Status: DC | PRN

## 2012-03-18 MED ORDER — METHOCARBAMOL 100 MG/ML IJ SOLN
500.0000 mg | Freq: Four times a day (QID) | INTRAVENOUS | Status: DC | PRN
  Administered 2012-03-18 – 2012-03-20 (×2): 500 mg via INTRAVENOUS
  Filled 2012-03-18 (×2): qty 5

## 2012-03-18 MED ORDER — TRAMADOL HCL 50 MG PO TABS
50.0000 mg | ORAL_TABLET | Freq: Four times a day (QID) | ORAL | Status: DC | PRN
  Administered 2012-03-20: 100 mg via ORAL
  Filled 2012-03-18: qty 2

## 2012-03-18 MED ORDER — ACETAMINOPHEN 10 MG/ML IV SOLN
INTRAVENOUS | Status: DC | PRN
  Administered 2012-03-18: 1000 mg via INTRAVENOUS

## 2012-03-18 MED ORDER — ONDANSETRON HCL 4 MG PO TABS: 4.0000 mg | ORAL_TABLET | Freq: Four times a day (QID) | ORAL | Status: DC | PRN

## 2012-03-18 MED ORDER — NEOSTIGMINE METHYLSULFATE 1 MG/ML IJ SOLN
INTRAMUSCULAR | Status: DC | PRN
  Administered 2012-03-18: 3 mg via INTRAVENOUS

## 2012-03-18 MED ORDER — BUPIVACAINE LIPOSOME 1.3 % IJ SUSP
20.0000 mL | Freq: Once | INTRAMUSCULAR | Status: AC
  Administered 2012-03-18: 20 mL
  Filled 2012-03-18: qty 20

## 2012-03-18 MED ORDER — FLEET ENEMA 7-19 GM/118ML RE ENEM: 1.0000 | ENEMA | Freq: Once | RECTAL | Status: AC | PRN

## 2012-03-18 MED ORDER — DEXAMETHASONE SODIUM PHOSPHATE 4 MG/ML IJ SOLN
INTRAMUSCULAR | Status: DC | PRN
  Administered 2012-03-18: 10 mg via INTRAVENOUS

## 2012-03-18 MED ORDER — METOCLOPRAMIDE HCL 10 MG PO TABS: 5.0000 mg | ORAL_TABLET | Freq: Three times a day (TID) | ORAL | Status: DC | PRN

## 2012-03-18 MED ORDER — RIVAROXABAN 10 MG PO TABS
10.0000 mg | ORAL_TABLET | Freq: Every day | ORAL | Status: DC
  Administered 2012-03-19 – 2012-03-21 (×3): 10 mg via ORAL
  Filled 2012-03-18 (×4): qty 1

## 2012-03-18 MED ORDER — GLYCOPYRROLATE 0.2 MG/ML IJ SOLN
INTRAMUSCULAR | Status: DC | PRN
  Administered 2012-03-18: 0.4 mg via INTRAVENOUS
  Administered 2012-03-18: 0.2 mg via INTRAVENOUS

## 2012-03-18 MED ORDER — SODIUM CHLORIDE 0.9 % IV SOLN: INTRAVENOUS | Status: DC

## 2012-03-18 MED ORDER — METHOCARBAMOL 500 MG PO TABS
500.0000 mg | ORAL_TABLET | Freq: Four times a day (QID) | ORAL | Status: DC | PRN
  Administered 2012-03-19 (×4): 500 mg via ORAL
  Filled 2012-03-18 (×4): qty 1

## 2012-03-18 MED ORDER — ACETAMINOPHEN 10 MG/ML IV SOLN
1000.0000 mg | Freq: Once | INTRAVENOUS | Status: DC
  Filled 2012-03-18: qty 100

## 2012-03-18 MED ORDER — POLYETHYLENE GLYCOL 3350 17 G PO PACK: 17.0000 g | PACK | Freq: Every day | ORAL | Status: DC | PRN

## 2012-03-18 SURGICAL SUPPLY — 53 items
BAG SPEC THK2 15X12 ZIP CLS (MISCELLANEOUS) ×1
BAG ZIPLOCK 12X15 (MISCELLANEOUS) ×2 IMPLANT
BIT DRILL 2.8X128 (BIT) ×2 IMPLANT
BLADE EXTENDED COATED 6.5IN (ELECTRODE) ×2 IMPLANT
BLADE SAW SAG 73X25 THK (BLADE) ×1
BLADE SAW SGTL 73X25 THK (BLADE) ×1 IMPLANT
CLOTH BEACON ORANGE TIMEOUT ST (SAFETY) ×2 IMPLANT
CLSR STERI-STRIP ANTIMIC 1/2X4 (GAUZE/BANDAGES/DRESSINGS) ×1 IMPLANT
DECANTER SPIKE VIAL GLASS SM (MISCELLANEOUS) ×2 IMPLANT
DRAPE INCISE IOBAN 66X45 STRL (DRAPES) ×3 IMPLANT
DRAPE ORTHO SPLIT 77X108 STRL (DRAPES) ×4
DRAPE POUCH INSTRU U-SHP 10X18 (DRAPES) ×2 IMPLANT
DRAPE SURG ORHT 6 SPLT 77X108 (DRAPES) ×2 IMPLANT
DRAPE U-SHAPE 47X51 STRL (DRAPES) ×2 IMPLANT
DRSG ADAPTIC 3X8 NADH LF (GAUZE/BANDAGES/DRESSINGS) ×2 IMPLANT
DRSG MEPILEX BORDER 4X4 (GAUZE/BANDAGES/DRESSINGS) ×2 IMPLANT
DRSG MEPILEX BORDER 4X8 (GAUZE/BANDAGES/DRESSINGS) ×2 IMPLANT
DURAPREP 26ML APPLICATOR (WOUND CARE) ×2 IMPLANT
ELECT REM PT RETURN 9FT ADLT (ELECTROSURGICAL) ×2
ELECTRODE REM PT RTRN 9FT ADLT (ELECTROSURGICAL) ×1 IMPLANT
EVACUATOR 1/8 PVC DRAIN (DRAIN) ×2 IMPLANT
FACESHIELD LNG OPTICON STERILE (SAFETY) ×8 IMPLANT
GLOVE BIO SURGEON STRL SZ7.5 (GLOVE) ×3 IMPLANT
GLOVE BIO SURGEON STRL SZ8 (GLOVE) ×2 IMPLANT
GLOVE BIOGEL PI IND STRL 8 (GLOVE) ×2 IMPLANT
GLOVE BIOGEL PI INDICATOR 8 (GLOVE) ×2
GLOVE SURG SS PI 7.5 STRL IVOR (GLOVE) ×4 IMPLANT
GOWN STRL NON-REIN LRG LVL3 (GOWN DISPOSABLE) ×2 IMPLANT
GOWN STRL REIN XL XLG (GOWN DISPOSABLE) ×4 IMPLANT
IMMOBILIZER KNEE 20 (SOFTGOODS)
IMMOBILIZER KNEE 20 THIGH 36 (SOFTGOODS) IMPLANT
KIT BASIN OR (CUSTOM PROCEDURE TRAY) ×2 IMPLANT
MANIFOLD NEPTUNE II (INSTRUMENTS) ×2 IMPLANT
NDL SAFETY ECLIPSE 18X1.5 (NEEDLE) ×1 IMPLANT
NEEDLE HYPO 18GX1.5 SHARP (NEEDLE) ×2
NS IRRIG 1000ML POUR BTL (IV SOLUTION) ×2 IMPLANT
PACK TOTAL JOINT (CUSTOM PROCEDURE TRAY) ×2 IMPLANT
PASSER SUT SWANSON 36MM LOOP (INSTRUMENTS) ×2 IMPLANT
POSITIONER SURGICAL ARM (MISCELLANEOUS) ×2 IMPLANT
SPONGE GAUZE 4X4 12PLY (GAUZE/BANDAGES/DRESSINGS) ×2 IMPLANT
STRIP CLOSURE SKIN 1/2X4 (GAUZE/BANDAGES/DRESSINGS) ×4 IMPLANT
SUT ETHIBOND NAB CT1 #1 30IN (SUTURE) ×4 IMPLANT
SUT MNCRL AB 4-0 PS2 18 (SUTURE) ×2 IMPLANT
SUT VIC AB 1 CT1 27 (SUTURE) ×4
SUT VIC AB 1 CT1 27XBRD ANTBC (SUTURE) ×2 IMPLANT
SUT VIC AB 2-0 CT1 27 (SUTURE) ×6
SUT VIC AB 2-0 CT1 TAPERPNT 27 (SUTURE) ×3 IMPLANT
SUT VLOC 180 0 24IN GS25 (SUTURE) ×5 IMPLANT
SYR 50ML LL SCALE MARK (SYRINGE) ×2 IMPLANT
TOWEL OR 17X26 10 PK STRL BLUE (TOWEL DISPOSABLE) ×4 IMPLANT
TOWEL OR NON WOVEN STRL DISP B (DISPOSABLE) ×2 IMPLANT
TRAY FOLEY CATH 14FRSI W/METER (CATHETERS) ×2 IMPLANT
WATER STERILE IRR 1500ML POUR (IV SOLUTION) ×3 IMPLANT

## 2012-03-18 NOTE — Anesthesia Preprocedure Evaluation (Signed)
Anesthesia Evaluation  Patient identified by MRN, date of birth, ID band Patient awake    Reviewed: Allergy & Precautions, H&P , NPO status , Patient's Chart, lab work & pertinent test results  Airway Mallampati: II TM Distance: <3 FB Neck ROM: Limited    Dental No notable dental hx.    Pulmonary neg pulmonary ROS,  breath sounds clear to auscultation  Pulmonary exam normal       Cardiovascular hypertension, Pt. on medications Rhythm:Regular Rate:Normal     Neuro/Psych Anxiety negative neurological ROS     GI/Hepatic Neg liver ROS, GERD-  Medicated,  Endo/Other  Morbid obesity  Renal/GU negative Renal ROS  negative genitourinary   Musculoskeletal negative musculoskeletal ROS (+)   Abdominal   Peds negative pediatric ROS (+)  Hematology negative hematology ROS (+)   Anesthesia Other Findings   Reproductive/Obstetrics negative OB ROS                           Anesthesia Physical Anesthesia Plan  ASA: III  Anesthesia Plan: General   Post-op Pain Management:    Induction: Intravenous  Airway Management Planned: Oral ETT  Additional Equipment:   Intra-op Plan:   Post-operative Plan: Extubation in OR  Informed Consent: I have reviewed the patients History and Physical, chart, labs and discussed the procedure including the risks, benefits and alternatives for the proposed anesthesia with the patient or authorized representative who has indicated his/her understanding and acceptance.   Dental advisory given  Plan Discussed with: CRNA  Anesthesia Plan Comments:         Anesthesia Quick Evaluation

## 2012-03-18 NOTE — Op Note (Signed)
Pre-operative diagnosis- Osteoarthritis Right hip  Post-operative diagnosis- Osteoarthritis  Right hip  Procedure-  RightTotal Hip Arthroplasty  Surgeon- Gus Rankin. Titilayo Hagans, MD  Assistant- Avel Peace, PA-C   Anesthesia  General  EBL- 400   Drain Hemovac   Complication- None  Condition-PACU - hemodynamically stable.   Brief Clinical Note-  Kathy [name] is a 61 y.o. female with end stage arthritis of her right hip with progressively worsening pain and dysfunction. Pain occurs with activity and rest including pain at night. She has tried analgesics, protected weight bearing and rest without benefit. Pain is too severe to attempt physical therapy. Radiographs demonstrate bone on bone arthritis with subchondral cyst formation. She presents now for right THA.  Procedure in detail-   The patient is brought into the operating room and placed on the operating table. After successful administration of General  anesthesia, the patient is placed in the  Left lateral decubitus position with the  Right side up and held in place with the hip positioner. The lower extremity is isolated from the perineum with plastic drapes and time-out is performed by the surgical team. The lower extremity is then prepped and draped in the usual sterile fashion. A short posterolateral incision is made with a ten blade through the subcutaneous tissue to the level of the fascia lata which is incised in line with the skin incision. The sciatic nerve is palpated and protected and the short external rotators and capsule are isolated from the femur. The hip is then dislocated and the center of the femoral head is marked. A trial prosthesis is placed such that the trial head corresponds to the center of the patients' native femoral head. The resection level is marked on the femoral neck and the resection is made with an oscillating saw. The femoral head is removed and femoral retractors placed to gain access to the femoral  canal.      The canal finder is passed into the femoral canal and the canal is thoroughly irrigated with sterile saline to remove the fatty contents. Axial reaming is performed to 15.5  mm, proximal reaming to 54F  and the sleeve machined to a large. A 54F large trial sleeve is placed into the proximal femur.      The femur is then retracted anteriorly to gain acetabular exposure. Acetabular retractors are placed and the labrum and osteophytes are removed, Acetabular reaming is performed to 53  mm and a 54  mm Pinnacle acetabular shell is placed in anatomic position with excellent purchase. Additional dome screws were placed. An apex hole eliminator is placed and the permanent 36 mm neutral + 4 Marathon liner is placed into the acetabular shell.      The trial femur is then placed into the femoral canal. The size is 20 x 15  stem with a 36 + 8  neck and a 36 + 3 head with the neck version matching  the patients' native anteversion. The hip is reduced with excellent stability with full extension and full external rotation, 70 degrees flexion with 40 degrees adduction and 90 degrees internal rotation and 90 degrees of flexion with 70 degrees of internal rotation. The operative leg is placed on top of the non-operative leg and the leg lengths are found to be equal. The trials are then removed and the permanent implant of the same size is impacted into the femoral canal. The  ceramic femoral head of the same size as the trial is placed and the hip  is reduced with the same stability parameters. The operative leg is again placed on top of the non-operative leg and the leg lengths are found to be equal.      The wound is then copiously irrigated with saline solution and the capsule and short external rotators are re-attached to the femur through drill holes with Ethibond suture. The fascia lata is closed over a hemovac drain with #1 vicryl suture and the fascia lata, gluteal muscles and subcutaneous tissues are  injected with Exparel 20ml diluted with saline 50ml. The subcutaneous tissues are closed with #1 and2-0 vicryl and the subcuticular layer closed with running 4-0 Monocryl. The drain is hooked to suction, incision cleaned and dried, and steri-srips and a bulky sterile dressing applied. The limb is placed into a knee immobilizer and the patient is awakened and transported to recovery in stable condition.      Please note that a surgical assistant was a medical necessity for this procedure in order to perform it in a safe and expeditious manner. The assistant was necessary to provide retraction to the vital neurovascular structures and to retract and position the limb to allow for anatomic placement of the prosthetic components.  Gus Rankin Ty Buntrock, MD    03/18/2012, 4:04 PM

## 2012-03-18 NOTE — Anesthesia Postprocedure Evaluation (Signed)
  Anesthesia Post-op Note  Patient: Kathy [name]  Procedure(s) Performed: Procedure(s) (LRB): TOTAL HIP ARTHROPLASTY (Right)  Patient Location: PACU  Anesthesia Type: General  Level of Consciousness: awake and alert   Airway and Oxygen Therapy: Patient Spontanous Breathing  Post-op Pain: mild  Post-op Assessment: Post-op Vital signs reviewed, Patient's Cardiovascular Status Stable, Respiratory Function Stable, Patent Airway and No signs of Nausea or vomiting  Post-op Vital Signs: stable  Complications: No apparent anesthesia complications

## 2012-03-18 NOTE — Preoperative (Signed)
Beta Blockers   Reason not to administer Beta Blockers:Not Applicable,  Took home BB this am 

## 2012-03-18 NOTE — Interval H&P Note (Signed)
History and Physical Interval Note:  03/18/2012 2:18 PM  Kathy [name]  has presented today for surgery, with the diagnosis of osteoarthritis right hip  The various methods of treatment have been discussed with the patient and family. After consideration of risks, benefits and other options for treatment, the patient has consented to  Procedure(s) (LRB): TOTAL HIP ARTHROPLASTY (Right) as a surgical intervention .  The patient's history has been reviewed, patient examined, no change in status, stable for surgery.  I have reviewed the patients' chart and labs.  Questions were answered to the patient's satisfaction.     Loanne Drilling

## 2012-03-18 NOTE — Anesthesia Procedure Notes (Signed)
Procedure Name: Intubation Date/Time: 03/18/2012 2:33 PM Performed by: Randon Goldsmith CATHERINE PAYNE Pre-anesthesia Checklist: Patient identified, Emergency Drugs available, Suction available and Patient being monitored Patient Re-evaluated:Patient Re-evaluated prior to inductionOxygen Delivery Method: Circle system utilized Preoxygenation: Pre-oxygenation with 100% oxygen Intubation Type: IV induction Ventilation: Mask ventilation without difficulty and Oral airway inserted - appropriate to patient size Laryngoscope Size: Miller and 3 Grade View: Grade II Tube type: Oral Tube size: 7.0 mm Number of attempts: 2 Airway Equipment and Method: Bougie stylet Placement Confirmation: positive ETCO2 and CO2 detector Secured at: 21 cm Tube secured with: Tape Dental Injury: Teeth and Oropharynx as per pre-operative assessment

## 2012-03-18 NOTE — Transfer of Care (Signed)
Immediate Anesthesia Transfer of Care Note  Patient: Kathy [name]  Procedure(s) Performed: Procedure(s) (LRB): TOTAL HIP ARTHROPLASTY (Right)  Patient Location: PACU  Anesthesia Type: General  Level of Consciousness: awake, alert , patient cooperative and responds to stimulation  Airway & Oxygen Therapy: Patient Spontanous Breathing and Patient connected to face mask oxygen  Post-op Assessment: Report given to PACU RN, Post -op Vital signs reviewed and stable and Patient moving all extremities  Post vital signs: Reviewed and stable  Complications: No apparent anesthesia complications

## 2012-03-19 LAB — CBC
HCT: 32.7 % — ABNORMAL LOW (ref 36.0–46.0)
Hemoglobin: 10.4 g/dL — ABNORMAL LOW (ref 12.0–15.0)
MCH: 29.5 pg (ref 26.0–34.0)
MCHC: 31.8 g/dL (ref 30.0–36.0)
MCV: 92.9 fL (ref 78.0–100.0)
Platelets: 185 10*3/uL (ref 150–400)
RBC: 3.52 MIL/uL — ABNORMAL LOW (ref 3.87–5.11)
RDW: 14.1 % (ref 11.5–15.5)
WBC: 7.6 10*3/uL (ref 4.0–10.5)

## 2012-03-19 LAB — BASIC METABOLIC PANEL
BUN: 12 mg/dL (ref 6–23)
CO2: 27 mEq/L (ref 19–32)
Calcium: 8.5 mg/dL (ref 8.4–10.5)
Chloride: 100 mEq/L (ref 96–112)
Creatinine, Ser: 0.75 mg/dL (ref 0.50–1.10)
GFR calc Af Amer: >90 mL/min (ref >90)
GFR calc non Af Amer: >90 mL/min (ref >90)
Glucose, Bld: 173 mg/dL — ABNORMAL HIGH (ref 70–99)
Potassium: 4.5 mEq/L (ref 3.5–5.1)
Sodium: 134 mEq/L — ABNORMAL LOW (ref 135–145)

## 2012-03-19 MED ORDER — NON FORMULARY: 40.0000 mg | Freq: Every day | Status: DC

## 2012-03-19 MED ORDER — OMEPRAZOLE 20 MG PO CPDR
40.0000 mg | DELAYED_RELEASE_CAPSULE | Freq: Every day | ORAL | Status: DC
  Administered 2012-03-19 – 2012-03-21 (×3): 40 mg via ORAL
  Filled 2012-03-19 (×4): qty 2

## 2012-03-19 NOTE — Progress Notes (Signed)
   Subjective: 1 Day Post-Op Procedure(s) (LRB): TOTAL HIP ARTHROPLASTY (Right) Patient reports pain as mild and moderate.   Patient seen in rounds by Dr. Lequita Halt. Husband in room. Patient is well, but has had some minor complaints of pain in the hip, requiring pain medications We will start therapy today.  Plan is to go Home after hospital stay.  Objective: Vital signs in last 24 hours: Temp:  [97.3 F (36.3 C)-98.2 F (36.8 C)] 97.6 F (36.4 C) (07/02 1610) Pulse Rate:  [58-78] 71  (07/02 0608) Resp:  [13-18] 16  (07/02 0608) BP: (109-162)/(64-86) 109/64 mmHg (07/02 0608) SpO2:  [92 %-100 %] 99 % (07/02 0608) Weight:  [124.286 kg (274 lb)] 124.286 kg (274 lb) (07/01 1745)  Intake/Output from previous day:  Intake/Output Summary (Last 24 hours) at 03/19/12 0735 Last data filed at 03/19/12 0626  Gross per 24 hour  Intake 4768.45 ml  Output   2695 ml  Net 2073.45 ml    Intake/Output this shift:    Labs:  Basename 03/19/12 0335  HGB 10.4*    Basename 03/19/12 0335  WBC 7.6  RBC 3.52*  HCT 32.7*  PLT 185    Basename 03/19/12 0335  NA 134*  K 4.5  CL 100  CO2 27  BUN 12  CREATININE 0.75  GLUCOSE 173*  CALCIUM 8.5   No results found for this basename: LABPT:2,INR:2 in the last 72 hours  EXAM General - Patient is Alert, Appropriate and Oriented Extremity - Neurovascular intact Sensation intact distally Dorsiflexion/Plantar flexion intact Dressing - dressing C/D/I Motor Function - intact, moving foot and toes well on exam.  Hemovac pulled without difficulty.  Past Medical History  Diagnosis Date  . HTN (hypertension)   . Diverticulosis   . Fibromyalgia     Dr Azzie Roup, Rheu  . IBS (irritable bowel syndrome)   . Anxiety and depression   . GERD (gastroesophageal reflux disease)   . Lumbar radiculopathy   . Syncope     idiopathic, ? related to migraines or hyperventilation. Dr Pearlean Brownie, Neurology,  ONE TIME EPISODE AND THIS WAS YEARS AGO  .  Obesity   . Anxiety   . Depression   . Heart murmur     mitral valve prolapse--no symptoms  . H/O hiatal hernia   . Headache     PAST HX MILD MIGRAINES  . Arthritis     Osteoarthritis, Dr Despina Hick    Assessment/Plan: 1 Day Post-Op Procedure(s) (LRB): TOTAL HIP ARTHROPLASTY (Right) Principal Problem:  *OA (osteoarthritis) of hip   Advance diet Up with therapy Continue foley due to strict I&O and urinary output monitoring Discharge home with home health  DVT Prophylaxis - Xarelto, ASA 325 mg on hold for now. Weight Bearing As Tolerated right Leg D/C Knee Immobilizer Hemovac Pulled Begin Therapy Hip Preacutions Keep foley until tomorrow. No vaccines.  Jaleia Hanke 03/19/2012, 7:35 AM

## 2012-03-19 NOTE — Evaluation (Signed)
Physical Therapy Evaluation Patient Details Name: Kathy [name] MRN: [id number] DOB: 07-07-1951 Today's Date: 03/19/2012 Time: 1478-2956 PT Time Calculation (min): 30 min  PT Assessment / Plan / Recommendation Clinical Impression  pt is s/p right THA--POD#1 and will benefit from PT to maximize independence for home with husband assist     PT Assessment  Patient needs continued PT services    Follow Up Recommendations  Home health PT    Barriers to Discharge        Equipment Recommendations  None recommended by PT    Recommendations for Other Services     Frequency 7X/week    Precautions / Restrictions Precautions Precautions: Posterior Hip Precaution Comments: sign in room Restrictions RLE Weight Bearing: Weight bearing as tolerated   Pertinent Vitals/Pain       Mobility  Bed Mobility Bed Mobility: Supine to Sit Supine to Sit: 4: Min assist;4: Min guard Details for Bed Mobility Assistance: verbal cues for technique Transfers Transfers: Sit to Stand;Stand to Sit Sit to Stand: 4: Min assist;From bed Stand to Sit: 4: Min assist;To chair/3-in-1 Details for Transfer Assistance: cues for THP and hand placement;  Ambulation/Gait Ambulation/Gait Assistance: 4: Min assist Ambulation Distance (Feet): 45 Feet Assistive device: Rolling walker Ambulation/Gait Assistance Details: cues for sequence and RW distance from self Gait Pattern: Step-to pattern    Exercises Total Joint Exercises Ankle Circles/Pumps: AROM;5 reps;Both Quad Sets: AROM;Both;10 reps   PT Diagnosis: Difficulty walking  PT Problem List: Decreased range of motion;Decreased strength;Decreased knowledge of precautions;Decreased mobility PT Treatment Interventions: DME instruction;Gait training;Stair training;Functional mobility training;Therapeutic activities;Therapeutic exercise;Balance training   PT Goals Acute Rehab PT Goals PT Goal Formulation: With patient Time For Goal Achievement:  03/19/12 Potential to Achieve Goals: Good Pt will go Supine/Side to Sit: with supervision PT Goal: Supine/Side to Sit - Progress: Goal set today Pt will go Sit to Stand: with supervision PT Goal: Sit to Stand - Progress: Goal set today Pt will go Stand to Sit: with supervision PT Goal: Stand to Sit - Progress: Goal set today Pt will Ambulate: 16 - 50 feet;with supervision;with rolling walker PT Goal: Ambulate - Progress: Goal set today Pt will Go Up / Down Stairs: 1-2 stairs;with min assist;with least restrictive assistive device PT Goal: Up/Down Stairs - Progress: Goal set today  Visit Information  Last PT Received On: 03/19/12 Assistance Needed: +1    Subjective Data  Subjective: I had my other hip done   Prior Functioning  Home Living Lives With: Spouse Available Help at Discharge: Family Type of Home: House Home Access: Stairs to enter [employer] of Steps: 2 Entrance Stairs-Rails:  (husband is the rail) Home Layout: Two level;Able to live on main level with bedroom/bathroom Bathroom Shower/Tub: Walk-in shower Home Adaptive Equipment: Reacher;Walker - rolling;Bedside commode/3-in-1;Straight cane Additional Comments: husband available through Sunday; then hired sitter; leg loop Prior Function Level of Independence: Independent Driving: Yes Communication Communication: No difficulties    Cognition  Overall Cognitive Status: Appears within functional limits for tasks assessed/performed Arousal/Alertness: Awake/alert Orientation Level: Appears intact for tasks assessed Behavior During Session: [facility] for tasks performed    Extremity/Trunk Assessment Right Upper Extremity Assessment RUE ROM/Strength/Tone: [facility] for tasks assessed Left Upper Extremity Assessment LUE ROM/Strength/Tone: [facility] for tasks assessed Right Lower Extremity Assessment RLE ROM/Strength/Tone: Deficits RLE ROM/Strength/Tone Deficits: ankle WFL; knee and hip grossly 2+/5 Left Lower Extremity  Assessment LLE ROM/Strength/Tone: [facility] for tasks assessed   Balance    End of Session PT - End of Session Equipment  Utilized During Treatment: Gait belt Activity Tolerance: Patient tolerated treatment well Patient left: in chair;with call bell/phone within reach Nurse Communication: Mobility status  GP     [facility] 03/19/2012, 1:56 PM

## 2012-03-19 NOTE — Progress Notes (Signed)
03/19/12 1400  PT Visit Information  Last PT Received On 03/19/12  Assistance Needed +1  PT Time Calculation  PT Start Time 1415  PT Stop Time 1443  PT Time Calculation (min) 28 min  Subjective Data  Subjective it is starting to spasm  Precautions  Precautions Posterior Hip  Precaution Comments sign in room  Restrictions  RLE Weight Bearing WBAT  Cognition  Overall Cognitive Status Appears within functional limits for tasks assessed/performed  Arousal/Alertness Awake/alert  Orientation Level Appears intact for tasks assessed  Behavior During Session [facility] for tasks performed  Bed Mobility  Bed Mobility Sit to Supine  Sit to Supine 4: Min assist;HOB flat  Details for Bed Mobility Assistance verbal cues for technique; pt using leg lifter  Transfers  Transfers Sit to Stand;Stand to Sit  Sit to Stand 4: Min assist;From chair/3-in-1  Stand to Sit 4: Min assist;To bed  Details for Transfer Assistance cues for THP and hand placement;   Ambulation/Gait  Ambulation/Gait Assistance 4: Min assist  Ambulation Distance (Feet) 5 Feet  Assistive device Rolling walker  Ambulation/Gait Assistance Details cues for sequence and RW distance from self  Gait Pattern Step-to pattern  General Gait Details more pain this pm with wt bearing  Total Joint Exercises  Ankle Circles/Pumps AROM;Both;10 reps  Quad Sets AROM;Both;10 reps  Heel Slides AROM;AAROM;Right;10 reps  PT - End of Session  Activity Tolerance Patient tolerated treatment well  Patient left in bed;with call bell/phone within reach;with nursing in room  Nurse Communication Patient requests pain meds  PT - Assessment/Plan  Comments on Treatment Session pain right hip 6/10; RN aware; progressing.  PT Plan Discharge plan remains appropriate;Frequency remains appropriate  PT Frequency 7X/week  Follow Up Recommendations Home health PT  Equipment Recommended None recommended by PT  Acute Rehab PT Goals  Time For Goal Achievement  03/26/12  Potential to Achieve Goals Good  Pt will go Supine/Side to Sit with supervision  PT Goal: Supine/Side to Sit - Progress Progressing toward goal  Pt will go Sit to Stand with supervision  PT Goal: Sit to Stand - Progress Progressing toward goal  Pt will go Stand to Sit with supervision  PT Goal: Stand to Sit - Progress Progressing toward goal  PT Treatments  $Therapeutic Exercise 8-22 mins  $Therapeutic Activity 8-22 mins

## 2012-03-20 ENCOUNTER — Encounter (HOSPITAL_COMMUNITY): Payer: Self-pay | Admitting: Orthopedic Surgery

## 2012-03-20 LAB — BASIC METABOLIC PANEL
BUN: 10 mg/dL (ref 6–23)
CO2: 31 mEq/L (ref 19–32)
Calcium: 8.4 mg/dL (ref 8.4–10.5)
Chloride: 100 mEq/L (ref 96–112)
Creatinine, Ser: 0.82 mg/dL (ref 0.50–1.10)
GFR calc Af Amer: 88 mL/min — ABNORMAL LOW (ref >90)
GFR calc non Af Amer: 76 mL/min — ABNORMAL LOW (ref >90)
Glucose, Bld: 108 mg/dL — ABNORMAL HIGH (ref 70–99)
Potassium: 3.3 mEq/L — ABNORMAL LOW (ref 3.5–5.1)
Sodium: 136 mEq/L (ref 135–145)

## 2012-03-20 LAB — CBC
HCT: 30 % — ABNORMAL LOW (ref 36.0–46.0)
Hemoglobin: 9.4 g/dL — ABNORMAL LOW (ref 12.0–15.0)
MCH: 29.7 pg (ref 26.0–34.0)
MCHC: 31.3 g/dL (ref 30.0–36.0)
MCV: 94.6 fL (ref 78.0–100.0)
Platelets: 188 10*3/uL (ref 150–400)
RBC: 3.17 MIL/uL — ABNORMAL LOW (ref 3.87–5.11)
RDW: 14.3 % (ref 11.5–15.5)
WBC: 8.8 10*3/uL (ref 4.0–10.5)

## 2012-03-20 MED ORDER — POTASSIUM CHLORIDE CRYS ER 20 MEQ PO TBCR
40.0000 meq | EXTENDED_RELEASE_TABLET | Freq: Two times a day (BID) | ORAL | Status: AC
  Administered 2012-03-20 (×2): 40 meq via ORAL
  Filled 2012-03-20 (×2): qty 2

## 2012-03-20 MED ORDER — RIVAROXABAN 10 MG PO TABS: 10.0000 mg | ORAL_TABLET | Freq: Every day | ORAL | Status: DC

## 2012-03-20 MED ORDER — OXYCODONE HCL 5 MG PO TABS: 5.0000 mg | ORAL_TABLET | ORAL | Status: AC | PRN

## 2012-03-20 MED ORDER — CYCLOBENZAPRINE HCL 10 MG PO TABS: 10.0000 mg | ORAL_TABLET | Freq: Three times a day (TID) | ORAL | Status: DC | PRN

## 2012-03-20 MED ORDER — DIAZEPAM 5 MG PO TABS: 5.0000 mg | ORAL_TABLET | Freq: Four times a day (QID) | ORAL | Status: DC | PRN

## 2012-03-20 MED ORDER — CYCLOBENZAPRINE HCL 10 MG PO TABS
10.0000 mg | ORAL_TABLET | Freq: Three times a day (TID) | ORAL | Status: DC | PRN
  Administered 2012-03-20 – 2012-03-21 (×4): 10 mg via ORAL
  Filled 2012-03-20 (×4): qty 1

## 2012-03-20 NOTE — Progress Notes (Signed)
03/20/12 1500  PT Visit Information  Last PT Received On 03/20/12  Assistance Needed +1  PT Time Calculation  PT Start Time 1422  PT Stop Time 1444  PT Time Calculation (min) 22 min  Precautions  Precautions Posterior Hip  Precaution Comments sign in room  Restrictions  RLE Weight Bearing WBAT  Cognition  Overall Cognitive Status Appears within functional limits for tasks assessed/performed  Arousal/Alertness Awake/alert  Orientation Level Appears intact for tasks assessed  Behavior During Session [facility] for tasks performed  Transfers  Transfers Sit to Stand;Stand to Sit  Sit to Stand 4: Min guard;From chair/3-in-1  Stand to Sit 4: Min guard;To chair/3-in-1  Details for Transfer Assistance cues for THP and hand placement;   Ambulation/Gait  Ambulation/Gait Assistance 4: Min guard;4: Min Designer, television/film set (Feet) 15 Feet  Assistive device Rolling walker  Ambulation/Gait Assistance Details cues for sequence and RW distance from self  Gait Pattern Step-to pattern  Gait velocity slow  General Gait Details pt fatigued  PT - End of Session  Activity Tolerance Patient limited by fatigue  Patient left in chair;with call bell/phone within reach;with nursing in room  PT - Assessment/Plan  Comments on Treatment Session progressing slowly, gait limited due to fatigue  PT Plan Discharge plan remains appropriate;Frequency remains appropriate  PT Frequency 7X/week  Follow Up Recommendations Home health PT  Equipment Recommended None recommended by PT  Acute Rehab PT Goals  Time For Goal Achievement 03/26/12  Potential to Achieve Goals Good  Pt will go Sit to Stand with supervision  PT Goal: Sit to Stand - Progress Progressing toward goal  Pt will go Stand to Sit with supervision  PT Goal: Stand to Sit - Progress Progressing toward goal  Pt will Ambulate 16 - 50 feet;with supervision;with rolling walker  PT Goal: Ambulate - Progress Not progressing

## 2012-03-20 NOTE — Care Management Note (Signed)
    Page 1 of 1   03/21/2012     11:57:35 AM   CARE MANAGEMENT NOTE 03/21/2012  Patient:  Kathy [name], Kathy [name]   Account Number:  [id number]  Date Initiated:  03/19/2012  Documentation initiated by:  Colleen Can  Subjective/Objective Assessment:   DX OSTEOARTHRITIS RIGHT HIP; TOTAL HIP REPLACEMNT  [place] referral for [facility] services.     Action/Plan:   CM spoke with patient. Plans are for patient to return to her home in [place] where spouse and friend will be caregivers. Has all of needed DME. Wants [facility] for [facility] services   Anticipated DC Date:  03/21/2012   Anticipated DC Plan:  HOME W HOME HEALTH SERVICES  In-house referral  NA      DC Planning Services  CM consult      [facility] Choice  HOME HEALTH   Choice offered to / List presented to:  C-1 Patient   DME arranged  NA      DME agency  NA     HH arranged  HH-2 PT      [facility] agency  [facility]   Status of service:  Completed, signed off Medicare Important Message given?  NO (If response is "NO", the following Medicare IM given date fields will be blank) Date Medicare IM given:   Date Additional Medicare IM given:    Discharge Disposition:  HOME W HOME HEALTH SERVICES  Per UR Regulation:  Reviewed for med. necessity/level of care/duration of stay  If discussed at Long Length of Stay Meetings, dates discussed:    Comments:  03/20/2012 Raynelle Bring BSN CCM [phone] [facility] pt orders, op report, h&p, face sheet faxed to [facility] Intake-[place]-confirmation received. Services to start 1-2 DAYS AFTER DISCHARGE(HOLIDAY WEEKEND)

## 2012-03-20 NOTE — Progress Notes (Signed)
OT Note:  Pt screened for OT.  She had THA surgery in 2011, has DME, AE and husband to help.  She is good with THPs per PT.  [place], [place] [phone] 03/20/2012

## 2012-03-20 NOTE — Progress Notes (Signed)
   Subjective: 2 Days Post-Op Procedure(s) (LRB): TOTAL HIP ARTHROPLASTY (Right) Patient reports pain as moderate.   Patient with a lot of complaints of spasms.  Will change meds. Patient is well, but has had some minor complaints of spasms. Plan is to go Home after hospital stay.  Objective: Vital signs in last 24 hours: Temp:  [98.1 F (36.7 C)-98.6 F (37 C)] 98.3 F (36.8 C) (07/03 0544) Pulse Rate:  [69-79] 76  (07/03 0544) Resp:  [16-20] 18  (07/03 0544) BP: (100-138)/(65-75) 138/75 mmHg (07/03 0544) SpO2:  [96 %-99 %] 96 % (07/03 0544)  Intake/Output from previous day:  Intake/Output Summary (Last 24 hours) at 03/20/12 0825 Last data filed at 03/20/12 0600  Gross per 24 hour  Intake 2023.75 ml  Output   2875 ml  Net -851.25 ml    Intake/Output this shift:    Labs:  Basename 03/20/12 0338 03/19/12 0335  HGB 9.4* 10.4*    Basename 03/20/12 0338 03/19/12 0335  WBC 8.8 7.6  RBC 3.17* 3.52*  HCT 30.0* 32.7*  PLT 188 185    Basename 03/20/12 0338 03/19/12 0335  NA 136 134*  K 3.3* 4.5  CL 100 100  CO2 31 27  BUN 10 12  CREATININE 0.82 0.75  GLUCOSE 108* 173*  CALCIUM 8.4 8.5   No results found for this basename: LABPT:2,INR:2 in the last 72 hours  EXAM General - Patient is Alert, Appropriate and Oriented Extremity - Neurovascular intact Sensation intact distally Dorsiflexion/Plantar flexion intact Dressing/Incision - clean, dry, no drainage, healing Motor Function - intact, moving foot and toes well on exam.   Past Medical History  Diagnosis Date  . HTN (hypertension)   . Diverticulosis   . Fibromyalgia     Dr Azzie Roup, Rheu  . IBS (irritable bowel syndrome)   . Anxiety and depression   . GERD (gastroesophageal reflux disease)   . Lumbar radiculopathy   . Syncope     idiopathic, ? related to migraines or hyperventilation. Dr Pearlean Brownie, Neurology,  ONE TIME EPISODE AND THIS WAS YEARS AGO  . Obesity   . Anxiety   . Depression   . Heart  murmur     mitral valve prolapse--no symptoms  . H/O hiatal hernia   . Headache     PAST HX MILD MIGRAINES  . Arthritis     Osteoarthritis, Dr Despina Hick    Assessment/Plan: 2 Days Post-Op Procedure(s) (LRB): TOTAL HIP ARTHROPLASTY (Right) Principal Problem:  *OA (osteoarthritis) of hip   Advance diet Up with therapy Plan for discharge tomorrow Discharge home with home health  DVT Prophylaxis - Xarelto, ASA 325 mg on hold for now. Weight Bearing As Tolerated right Leg  PERKINS, ALEXZANDREW 03/20/2012, 8:25 AM

## 2012-03-20 NOTE — Progress Notes (Signed)
Physical Therapy Treatment Patient Details Name: Kathy [name] MRN: [id number] DOB: [birth year] Today's Date: 03/20/2012 Time: 0950-1019 PT Time Calculation (min): 29 min  PT Assessment / Plan / Recommendation Comments on Treatment Session  O2 sats 95% on RA, HR in 70s    Follow Up Recommendations  Home health PT    Barriers to Discharge        Equipment Recommendations  None recommended by PT    Recommendations for Other Services    Frequency 7X/week   Plan Discharge plan remains appropriate;Frequency remains appropriate    Precautions / Restrictions Precautions Precautions: Posterior Hip Precaution Comments: sign in room Restrictions Weight Bearing Restrictions: No RLE Weight Bearing: Weight bearing as tolerated   Pertinent Vitals/Pain     Mobility  Bed Mobility Bed Mobility: Sit to Supine Supine to Sit: 4: Min assist;HOB elevated Details for Bed Mobility Assistance: verbal cues for technique; pt using leg lifter Transfers Transfers: Sit to Stand;Stand to Sit Sit to Stand: 4: Min assist;From bed Stand to Sit: 4: Min assist;To chair/3-in-1 Details for Transfer Assistance: cues for THP and hand placement;  Ambulation/Gait Ambulation/Gait Assistance: 4: Min assist Ambulation Distance (Feet): 11 Feet Assistive device: Rolling walker Ambulation/Gait Assistance Details: cues for sequence and RW distance from self Gait Pattern: Step-to pattern Gait velocity: slow General Gait Details: pt fatigued, limited by pain this am    Exercises Total Joint Exercises Ankle Circles/Pumps: AROM;Both;10 reps Quad Sets: AROM;Both;10 reps Short Arc Quad: AAROM;AROM;Right;10 reps Heel Slides: AROM;AAROM;Right;10 reps Hip ABduction/ADduction: AAROM;Right;10 reps   PT Diagnosis:    PT Problem List:   PT Treatment Interventions:     PT Goals Acute Rehab PT Goals Time For Goal Achievement: 03/26/12 Potential to Achieve Goals: Good Pt will go Supine/Side to Sit: with  supervision PT Goal: Supine/Side to Sit - Progress: Progressing toward goal Pt will go Sit to Stand: with supervision PT Goal: Sit to Stand - Progress: Progressing toward goal Pt will go Stand to Sit: with supervision PT Goal: Stand to Sit - Progress: Progressing toward goal Pt will Ambulate: 16 - 50 feet;with supervision;with rolling walker PT Goal: Ambulate - Progress: Progressing toward goal  Visit Information  Last PT Received On: 03/20/12 Assistance Needed: +1    Subjective Data  Subjective: i had a rough night   Cognition  Overall Cognitive Status: Appears within functional limits for tasks assessed/performed Arousal/Alertness: Awake/alert Orientation Level: Appears intact for tasks assessed Behavior During Session: [facility] for tasks performed    Balance     End of Session PT - End of Session Activity Tolerance: Patient limited by pain Patient left: in chair;with call bell/phone within reach;with nursing in room   GP     [facility] 03/20/2012, 10:25 AM

## 2012-03-21 LAB — CBC
HCT: 29 % — ABNORMAL LOW (ref 36.0–46.0)
Hemoglobin: 9.3 g/dL — ABNORMAL LOW (ref 12.0–15.0)
MCH: 30 pg (ref 26.0–34.0)
MCHC: 32.1 g/dL (ref 30.0–36.0)
MCV: 93.5 fL (ref 78.0–100.0)
Platelets: 174 10*3/uL (ref 150–400)
RBC: 3.1 MIL/uL — ABNORMAL LOW (ref 3.87–5.11)
RDW: 14.2 % (ref 11.5–15.5)
WBC: 8.8 10*3/uL (ref 4.0–10.5)

## 2012-03-21 LAB — BASIC METABOLIC PANEL
BUN: 9 mg/dL (ref 6–23)
CO2: 30 mEq/L (ref 19–32)
Calcium: 8.7 mg/dL (ref 8.4–10.5)
Chloride: 97 mEq/L (ref 96–112)
Creatinine, Ser: 0.78 mg/dL (ref 0.50–1.10)
GFR calc Af Amer: >90 mL/min (ref >90)
GFR calc non Af Amer: 89 mL/min — ABNORMAL LOW (ref >90)
Glucose, Bld: 113 mg/dL — ABNORMAL HIGH (ref 70–99)
Potassium: 3.6 mEq/L (ref 3.5–5.1)
Sodium: 134 mEq/L — ABNORMAL LOW (ref 135–145)

## 2012-03-21 NOTE — Progress Notes (Signed)
03/21/12 1100  PT Visit Information  Last PT Received On 03/21/12  Assistance Needed +1  PT Time Calculation  PT Start Time 1059  PT Stop Time 1118  PT Time Calculation (min) 19 min  Subjective Data  Subjective tired  Patient Stated Goal home  Precautions  Precautions Posterior Hip  Precaution Comments sign in room  Restrictions  RLE Weight Bearing WBAT  Cognition  Overall Cognitive Status Appears within functional limits for tasks assessed/performed  Arousal/Alertness Awake/alert  Orientation Level Appears intact for tasks assessed  Behavior During Session [facility] for tasks performed  Transfers  Transfers Sit to Stand;Stand to Sit  Sit to Stand (pt in room)  Stand to Sit 4: Min assist;To chair/3-in-1  Details for Transfer Assistance cues for THP and hand placement;   Ambulation/Gait  Ambulation/Gait Assistance 4: Min assist  Ambulation Distance (Feet) 10 Feet  Assistive device Rolling walker  Ambulation/Gait Assistance Details cues for sequence and RW distance from self (pt has difficulty stepping into her reg RW due to size)  Gait Pattern Step-to pattern  Gait velocity slow  General Gait Details pt fatigued  Stairs Yes  Stairs Assistance 4: Min assist;3: Mod assist  Stairs Assistance Details (indicate cue type and reason) verbal cues for sequence and wt shift  Stair Management Technique Backwards;With walker  Number of Stairs 1  (pt too fatigued for second step)  PT - End of Session  Activity Tolerance Patient limited by fatigue  Patient left in chair;with call bell/phone within reach  Nurse Communication (pt fatigued)  PT - Assessment/Plan  Comments on Treatment Session progressing slowly, gait limited due to fatigue  PT Plan Discharge plan remains appropriate;Frequency remains appropriate  PT Frequency 7X/week  Follow Up Recommendations Home health PT  Equipment Recommended None recommended by PT  Acute Rehab PT Goals  Time For Goal Achievement 03/26/12  Potential  to Achieve Goals Good  Pt will go Sit to Stand with supervision  PT Goal: Sit to Stand - Progress Not met  Pt will go Stand to Sit with supervision  PT Goal: Stand to Sit - Progress Not met  Pt will Ambulate 16 - 50 feet;with supervision;with rolling walker  PT Goal: Ambulate - Progress Not met  Pt will Go Up / Down Stairs 1-2 stairs;with min assist;with least restrictive assistive device  PT Goal: Up/Down Stairs - Progress Not met

## 2012-03-21 NOTE — Progress Notes (Signed)
Subjective: 3 Days Post-Op Procedure(s) (LRB): TOTAL HIP ARTHROPLASTY (Right) Patient reports pain as mild.   Denies CP or SOB.  Voiding without difficulty. Positive flatus. Objective: Vital signs in last 24 hours: Temp:  [98.4 F (36.9 C)-99.8 F (37.7 C)] 98.4 F (36.9 C) (07/04 0556) Pulse Rate:  [82-86] 86  (07/04 0556) Resp:  [16-20] 16  (07/04 0556) BP: (123-133)/(80-84) 133/82 mmHg (07/04 0556) SpO2:  [94 %-96 %] 96 % (07/04 0556)  Intake/Output from previous day: 07/03 0701 - 07/04 0700 In: 840 [P.O.:840] Out: 1750 [Urine:1750] Intake/Output this shift:     Basename 03/21/12 0347 03/20/12 0338 03/19/12 0335  HGB 9.3* 9.4* 10.4*    Basename 03/21/12 0347 03/20/12 0338  WBC 8.8 8.8  RBC 3.10* 3.17*  HCT 29.0* 30.0*  PLT 174 188    Basename 03/21/12 0347 03/20/12 0338  NA 134* 136  K 3.6 3.3*  CL 97 100  CO2 30 31  BUN 9 10  CREATININE 0.78 0.82  GLUCOSE 113* 108*  CALCIUM 8.7 8.4   No results found for this basename: LABPT:2,INR:2 in the last 72 hours  Neurologically intact Neurovascular intact Sensation intact distally Dorsiflexion/Plantar flexion intact Incision: dressing C/D/I Compartment soft  Assessment/Plan: 3 Days Post-Op Procedure(s) (LRB): TOTAL HIP ARTHROPLASTY (Right) Discharge home with home health  Kathy [name] 03/21/2012, 7:41 AM

## 2012-03-21 NOTE — Progress Notes (Signed)
Physical Therapy Treatment Patient Details Name: Kathy [name] MRN: [id number] DOB: [birth year] Today's Date: 03/21/2012 Time: 4098-1191 PT Time Calculation (min): 21 min  PT Assessment / Plan / Recommendation Comments on Treatment Session  progressing slowly, gait limited due to fatigue; will attempt stairs after pt has rest break, c/o of UE fatigue    Follow Up Recommendations  Home health PT    Barriers to Discharge        Equipment Recommendations  None recommended by PT    Recommendations for Other Services    Frequency 7X/week   Plan Discharge plan remains appropriate;Frequency remains appropriate    Precautions / Restrictions Precautions Precautions: Posterior Hip Precaution Comments: sign in room Restrictions RLE Weight Bearing: Weight bearing as tolerated   Pertinent Vitals/Pain     Mobility  Bed Mobility Bed Mobility: Sit to Supine Supine to Sit: 4: Min assist;HOB elevated Details for Bed Mobility Assistance: verbal cues for technique; pt using leg lifter Transfers Transfers: Sit to Stand;Stand to Sit Sit to Stand: 4: Min guard;From bed;From chair/3-in-1 Stand to Sit: 4: Min guard;To chair/3-in-1 Details for Transfer Assistance: cues for THP and hand placement;  Ambulation/Gait Ambulation/Gait Assistance: 4: Min guard Ambulation Distance (Feet): 10 Feet (X2) Assistive device: Rolling walker Ambulation/Gait Assistance Details: cues for sequence and RW distance from self (pt has difficulty stepping into her reg RW due to size) Gait Pattern: Step-to pattern Gait velocity: slow General Gait Details: pt fatigued    Exercises     PT Diagnosis:    PT Problem List:   PT Treatment Interventions:     PT Goals Acute Rehab PT Goals Time For Goal Achievement: 03/26/12 Potential to Achieve Goals: Good Pt will go Supine/Side to Sit: with supervision PT Goal: Supine/Side to Sit - Progress: Progressing toward goal Pt will go Sit to Stand: with supervision PT  Goal: Sit to Stand - Progress: Progressing toward goal Pt will go Stand to Sit: with supervision PT Goal: Stand to Sit - Progress: Progressing toward goal Pt will Ambulate: 16 - 50 feet;with supervision;with rolling walker PT Goal: Ambulate - Progress: Progressing toward goal  Visit Information  Last PT Received On: 03/21/12 Assistance Needed: +1    Subjective Data  Subjective: it is getting better Patient Stated Goal: home   Cognition  Overall Cognitive Status: Appears within functional limits for tasks assessed/performed Arousal/Alertness: Awake/alert Orientation Level: Appears intact for tasks assessed Behavior During Session: [facility] for tasks performed    Balance     End of Session PT - End of Session Activity Tolerance: Patient limited by fatigue Patient left: in chair;with call bell/phone within reach   GP     [facility] 03/21/2012, 10:01 AM

## 2012-03-26 NOTE — Discharge Summary (Signed)
Physician Discharge Summary   Patient ID: Kathy [name] MRN: [id number] DOB/AGE: 03/27/1951 61 y.o.  Admit date: 03/18/2012 Discharge date: 03/21/2012  Primary Diagnosis: Osteoarthritis Right Hip   Admission Diagnoses:  Past Medical History  Diagnosis Date  . HTN (hypertension)   . Diverticulosis   . Fibromyalgia     Dr Azzie Roup, Rheu  . IBS (irritable bowel syndrome)   . Anxiety and depression   . GERD (gastroesophageal reflux disease)   . Lumbar radiculopathy   . Syncope     idiopathic, ? related to migraines or hyperventilation. Dr Pearlean Brownie, Neurology,  ONE TIME EPISODE AND THIS WAS YEARS AGO  . Obesity   . Anxiety   . Depression   . Heart murmur     mitral valve prolapse--no symptoms  . H/O hiatal hernia   . Headache     PAST HX MILD MIGRAINES  . Arthritis     Osteoarthritis, Dr Despina Hick   Discharge Diagnoses:   Principal Problem:  *OA (osteoarthritis) of hip  Procedure: Procedure(s) (LRB): TOTAL HIP ARTHROPLASTY (Right)   Consults: None  HPI: Kathy [name] is a 61 y.o. female with end stage arthritis of her right hip with progressively worsening pain and dysfunction. Pain occurs with activity and rest including pain at night. She has tried analgesics, protected weight bearing and rest without benefit. Pain is too severe to attempt physical therapy. Radiographs demonstrate bone on bone arthritis with subchondral cyst formation. She presents now for right THA.  Laboratory Data: Hospital Outpatient Visit on 03/11/2012  Component Date Value Range Status  . MRSA, PCR 03/11/2012 NEGATIVE  NEGATIVE Final  . Staphylococcus aureus 03/11/2012 NEGATIVE  NEGATIVE Final   Comment:                                 The Xpert SA Assay (FDA                          approved for NASAL specimens                          only), is one component of                          a comprehensive surveillance                          program.  It is not intended             to diagnose infection nor to                          guide or monitor treatment.  Marland Kitchen aPTT 03/11/2012 32  24 - 37 seconds Final  . WBC 03/11/2012 4.4  4.0 - 10.5 K/uL Final  . RBC 03/11/2012 4.43  3.87 - 5.11 MIL/uL Final  . Hemoglobin 03/11/2012 13.3  12.0 - 15.0 g/dL Final  . HCT 40/98/1191 41.4  36.0 - 46.0 % Final  . MCV 03/11/2012 93.5  78.0 - 100.0 fL Final  . MCH 03/11/2012 30.0  26.0 - 34.0 pg Final  . MCHC 03/11/2012 32.1  30.0 - 36.0 g/dL Final  . RDW 47/82/9562 14.5  11.5 - 15.5 % Final  . Platelets 03/11/2012 209  150 - 400  K/uL Final  . Sodium 03/11/2012 139  135 - 145 mEq/L Final  . Potassium 03/11/2012 3.8  3.5 - 5.1 mEq/L Final  . Chloride 03/11/2012 100  96 - 112 mEq/L Final  . CO2 03/11/2012 30  19 - 32 mEq/L Final  . Glucose, Bld 03/11/2012 82  70 - 99 mg/dL Final  . BUN 16/06/9603 15  6 - 23 mg/dL Final  . Creatinine, Ser 03/11/2012 0.89  0.50 - 1.10 mg/dL Final  . Calcium 54/05/8118 9.3  8.4 - 10.5 mg/dL Final  . Total Protein 03/11/2012 7.1  6.0 - 8.3 g/dL Final  . Albumin 14/78/2956 4.2  3.5 - 5.2 g/dL Final  . AST 21/30/8657 18  0 - 37 U/L Final  . ALT 03/11/2012 17  0 - 35 U/L Final  . Alkaline Phosphatase 03/11/2012 71  39 - 117 U/L Final  . Total Bilirubin 03/11/2012 0.5  0.3 - 1.2 mg/dL Final  . GFR calc non Af Amer 03/11/2012 69* >90 mL/min Final  . GFR calc Af Amer 03/11/2012 80* >90 mL/min Final   Comment:                                 The eGFR has been calculated                          using the CKD EPI equation.                          This calculation has not been                          validated in all clinical                          situations.                          eGFR's persistently                          <90 mL/min signify                          possible Chronic Kidney Disease.  Marland Kitchen Prothrombin Time 03/11/2012 13.5  11.6 - 15.2 seconds Final  . INR 03/11/2012 1.01  0.00 - 1.49 Final  . ABO/RH(D) 03/11/2012 O POS    Final  . Antibody Screen 03/11/2012 NEG   Final  . Sample Expiration 03/11/2012 03/21/2012   Final  . Color, Urine 03/11/2012 YELLOW  YELLOW Final  . APPearance 03/11/2012 CLOUDY* CLEAR Final  . Specific Gravity, Urine 03/11/2012 1.028  1.005 - 1.030 Final  . pH 03/11/2012 5.5  5.0 - 8.0 Final  . Glucose, UA 03/11/2012 NEGATIVE  NEGATIVE mg/dL Final  . Hgb urine dipstick 03/11/2012 NEGATIVE  NEGATIVE Final  . Bilirubin Urine 03/11/2012 SMALL* NEGATIVE Final  . Ketones, ur 03/11/2012 NEGATIVE  NEGATIVE mg/dL Final  . Protein, ur 84/69/6295 NEGATIVE  NEGATIVE mg/dL Final  . Urobilinogen, UA 03/11/2012 0.2  0.0 - 1.0 mg/dL Final  . Nitrite 28/41/3244 NEGATIVE  NEGATIVE Final  . Leukocytes, UA 03/11/2012 NEGATIVE  NEGATIVE Final   MICROSCOPIC NOT DONE ON URINES WITH NEGATIVE PROTEIN, BLOOD, LEUKOCYTES,  NITRITE, OR GLUCOSE <1000 mg/dL.   No results found for this basename: HGB:5 in the last 72 hours No results found for this basename: WBC:2,RBC:2,HCT:2,PLT:2 in the last 72 hours No results found for this basename: NA:2,K:2,CL:2,CO2:2,BUN:2,CREATININE:2,GLUCOSE:2,CALCIUM:2 in the last 72 hours No results found for this basename: LABPT:2,INR:2 in the last 72 hours  X-Rays:Dg Chest 2 View  03/11/2012  *RADIOLOGY REPORT*  Clinical Data: Preoperative evaluation prior to right total hip arthroplasty.  CHEST - 2 VIEW  Comparison: No priors.  Findings: Lung volumes are normal.  No consolidative airspace disease.  No pleural effusions.  No pneumothorax.  No pulmonary nodule or mass noted.  Pulmonary vasculature and the cardiomediastinal silhouette are within normal limits.  Surgical clips project over the right upper quadrant of the abdomen, likely from prior cholecystectomy.  IMPRESSION: 1. No radiographic evidence of acute cardiopulmonary disease.  Original Report Authenticated By: Florencia Reasons, M.D.   Dg Hip Complete Right  03/11/2012  *RADIOLOGY REPORT*  Clinical Data: Preoperative evaluation  for right total hip replacement.  RIGHT HIP - COMPLETE 2+ VIEW  Comparison: Left hip radiographs 02/21/2010.  Pelvis 02/1959 1011.  Findings: Single AP view of the pelvis and AP and lateral views of the right hip again demonstrate postoperative changes of left-sided total hip arthroplasty.  The femoral and acetabular components appear well seated without obvious periprosthetic lucency to suggest loosening, and no evidence of periprosthetic fracture. Bony pelvis is intact.  There are degenerative changes of osteoarthritis in the right hip joint including joint space narrowing, subchondral sclerosis, subchondral cyst formation and osteophyte formation.  IMPRESSION: 1.  Degenerative changes of osteoarthritis of the hip joint, as above. 2.  Status post left total hip arthroplasty.  Original Report Authenticated By: Florencia Reasons, M.D.   Dg Pelvis Portable  03/18/2012  *RADIOLOGY REPORT*  Clinical Data: Osteoarthritis of the right hip.  Status post right total hip replacement.  PORTABLE PELVIS  Comparison: Radiographs dated 03/11/2012  Findings: Acetabular and femoral components of the right hip prosthesis appear in excellent position.  No fractures.  Soft tissue drain in place.  No change in the appearance of the left total hip prosthesis.  IMPRESSION: Satisfactory appearance of the right hip in the AP projection after total hip replacement.  Original Report Authenticated By: Gwynn Burly, M.D.   Dg Hip Portable 1 View Right  03/18/2012  *RADIOLOGY REPORT*  Clinical Data: Osteoarthritis of the right hip.  PORTABLE RIGHT HIP - 1 VIEW  Comparison: Radiographs dated 03/11/2012  Findings: Acetabular and femoral components appear in good position.  No fracture.  Soft tissue drain in place.  IMPRESSION: Satisfactory appearance of the right hip after total hip replacement.  Original Report Authenticated By: Gwynn Burly, M.D.    EKG: Orders placed in visit on 01/05/12  . EKG 12-LEAD     Hospital  Course: Patient was admitted to [facility] and taken to the OR and underwent the above state procedure without complications.  Patient tolerated the procedure well and was later transferred to the recovery room and then to the orthopaedic floor for postoperative care.  They were given PO and IV analgesics for pain control following their surgery.  They were given 24 hours of postoperative antibiotics and started on DVT prophylaxis in the form of Xarelto.   PT and OT were ordered for total hip protocol.  The patient was allowed to be WBAT with therapy. Discharge planning was consulted to help with postop disposition and equipment needs.  Patient had a tough night on the evening of surgery due to pain but started to get up OOB with therapy on day one.  Hemovac drain was pulled without difficulty.  The knee immobilizer was removed and discontinued.  Continued to work with therapy into day two.  Dressing was changed on day two and the incision was healing well.  She had some complaints of spasms. Changed meds.  By day three, the patient had progressed with therapy and meeting their goals.  Incision was healing well.  Patient was seen in rounds and was ready to go home.  Discharge Medications: Prior to Admission medications   Medication Sig Start Date End Date Taking? Authorizing Provider  ALPRAZolam Prudy Feeler) 1 MG tablet Take 1 mg by mouth 4 (four) times daily as needed. ANXIETY   Yes Historical Provider, MD  BENICAR 40 MG tablet take 1 tablet by mouth once daily 08/05/11  Yes Pecola Lawless, MD  FLUoxetine (PROZAC) 40 MG capsule Take 40 mg by mouth daily.    Yes Historical Provider, MD  metoprolol succinate (TOPROL-XL) 100 MG 24 hr tablet take 1 tablet by mouth daily 11/09/11  Yes Pecola Lawless, MD  omeprazole (PRILOSEC) 40 MG capsule Take 1 capsule (40 mg total) by mouth daily. 08/05/11  Yes Iva Boop, MD  cyclobenzaprine (FLEXERIL) 10 MG tablet Take 1 tablet (10 mg total) by mouth 3 (three)  times daily as needed for muscle spasms. SPASMS 03/20/12   Alexzandrew Julien Girt, PA  oxyCODONE (OXY IR/ROXICODONE) 5 MG immediate release tablet Take 1-2 tablets (5-10 mg total) by mouth every 4 (four) hours as needed for pain. 03/20/12 03/30/12  Alexzandrew Julien Girt, PA  rivaroxaban (XARELTO) 10 MG TABS tablet Take 1 tablet (10 mg total) by mouth daily with breakfast. Take Xarelto for two and a half more weeks, then discontinue Xarelto. Once the patient has completed the Xarelto, they may resume the 325 mg Aspirin. 03/20/12   Alexzandrew Julien Girt, PA    Diet: Cardiac diet Activity:WBAT No bending hip over 90 degrees- A "L" Angle Do not cross legs Do not let foot roll inward When turning these patients a pillow should be placed between the patient's legs to prevent crossing. Patients should have the affected knee fully extended when trying to sit or stand from all surfaces to prevent excessive hip flexion. When ambulating and turning toward the affected side the affected leg should have the toes turned out prior to moving the walker and the rest of patient's body as to prevent internal rotation/ turning in of the leg. Abduction pillows are the most effective way to prevent a patient from not crossing legs or turning toes in at rest. If an abduction pillow is not ordered placing a regular pillow length wise between the patient's legs is also an effective reminder. It is imperative that these precautions be maintained so that the surgical hip does not dislocate. Follow-up:in 2 weeks Disposition - Home Discharged Condition: good   Discharge Orders    Future Orders Please Complete By Expires   Diet general      Call MD / Call 911      Comments:   If you experience chest pain or shortness of breath, CALL 911 and be transported to the hospital emergency room.  If you develope a fever above 101 F, pus (white drainage) or increased drainage or redness at the wound, or calf pain, call your surgeon's office.     Discharge instructions      Comments:  Pick up stool softner and laxative for home. Do not submerge incision under water. May shower. Continue to use ice for pain and swelling from surgery. Hip precautions.  Total Hip Protocol.  Take Xarelto for two and a half more weeks, then discontinue Xarelto. Once the patient has completed the Xarelto, they may resume the 325 mg Aspirin.   Constipation Prevention      Comments:   Drink plenty of fluids.  Prune juice may be helpful.  You may use a stool softener, such as Colace (over the counter) 100 mg twice a day.  Use MiraLax (over the counter) for constipation as needed.   Increase activity slowly as tolerated      Patient may shower      Comments:   You may shower without a dressing once there is no drainage.  Do not wash over the wound.  If drainage remains, do not shower until drainage stops.   Driving restrictions      Comments:   No driving until released by the physician.   Lifting restrictions      Comments:   No lifting until released by the physician.   Follow the hip precautions as taught in Physical Therapy      Change dressing      Comments:   You may change your dressing dressing daily with sterile 4 x 4 inch gauze dressing and paper tape.  Do not submerge the incision under water.   TED hose      Comments:   Use stockings (TED hose) for 3 weeks on both leg(s).  You may remove them at night for sleeping.   Do not sit on low chairs, stoools or toilet seats, as it may be difficult to get up from low surfaces        Medication List  As of 03/26/2012  9:25 AM   STOP taking these medications         aspirin 325 MG tablet      CALCIUM 1000 + D PO      HYDROcodone-acetaminophen 5-500 MG per tablet      ibuprofen 800 MG tablet      multivitamin tablet         TAKE these medications         ALPRAZolam 1 MG tablet   Commonly known as: XANAX   Take 1 mg by mouth 4 (four) times daily as needed. ANXIETY      BENICAR 40  MG tablet   Generic drug: olmesartan   take 1 tablet by mouth once daily      cyclobenzaprine 10 MG tablet   Commonly known as: FLEXERIL   Take 1 tablet (10 mg total) by mouth 3 (three) times daily as needed for muscle spasms. SPASMS      FLUoxetine 40 MG capsule   Commonly known as: PROZAC   Take 40 mg by mouth daily.      metoprolol succinate 100 MG 24 hr tablet   Commonly known as: TOPROL-XL   take 1 tablet by mouth daily      omeprazole 40 MG capsule   Commonly known as: PRILOSEC   Take 1 capsule (40 mg total) by mouth daily.      oxyCODONE 5 MG immediate release tablet   Commonly known as: Oxy IR/ROXICODONE   Take 1-2 tablets (5-10 mg total) by mouth every 4 (four) hours as needed for pain.      rivaroxaban 10 MG Tabs tablet   Commonly  known as: XARELTO   Take 1 tablet (10 mg total) by mouth daily with breakfast. Take Xarelto for two and a half more weeks, then discontinue Xarelto.  Once the patient has completed the Xarelto, they may resume the 325 mg Aspirin.           Follow-up Information    Follow up with Loanne Drilling, MD. Schedule an appointment as soon as possible for a visit in 2 weeks.   Contact information:   [facility] [address], [address] [place] [address] [phone]          Signed: Patrica Duel 03/26/2012, 9:25 AM

## 2012-03-28 ENCOUNTER — Emergency Department (HOSPITAL_COMMUNITY)
Admission: EM | Admit: 2012-03-28 | Discharge: 2012-03-28 | Disposition: A | Discharge status: Home / Self Care | Payer: 59 | Attending: Emergency Medicine | Admitting: Emergency Medicine

## 2012-03-28 ENCOUNTER — Emergency Department (HOSPITAL_COMMUNITY): Payer: 59

## 2012-03-28 ENCOUNTER — Encounter (HOSPITAL_COMMUNITY): Payer: Self-pay | Admitting: Family Medicine

## 2012-03-28 ENCOUNTER — Telehealth: Payer: Self-pay | Admitting: Internal Medicine

## 2012-03-28 DIAGNOSIS — I10 Essential (primary) hypertension: Secondary | ICD-10-CM | POA: Insufficient documentation

## 2012-03-28 DIAGNOSIS — M129 Arthropathy, unspecified: Secondary | ICD-10-CM | POA: Insufficient documentation

## 2012-03-28 DIAGNOSIS — M542 Cervicalgia: Secondary | ICD-10-CM

## 2012-03-28 DIAGNOSIS — E669 Obesity, unspecified: Secondary | ICD-10-CM | POA: Insufficient documentation

## 2012-03-28 DIAGNOSIS — Y92009 Unspecified place in unspecified non-institutional (private) residence as the place of occurrence of the external cause: Secondary | ICD-10-CM | POA: Insufficient documentation

## 2012-03-28 DIAGNOSIS — K219 Gastro-esophageal reflux disease without esophagitis: Secondary | ICD-10-CM | POA: Insufficient documentation

## 2012-03-28 DIAGNOSIS — R011 Cardiac murmur, unspecified: Secondary | ICD-10-CM | POA: Insufficient documentation

## 2012-03-28 DIAGNOSIS — W19XXXA Unspecified fall, initial encounter: Secondary | ICD-10-CM

## 2012-03-28 DIAGNOSIS — N390 Urinary tract infection, site not specified: Secondary | ICD-10-CM

## 2012-03-28 DIAGNOSIS — M25559 Pain in unspecified hip: Secondary | ICD-10-CM

## 2012-03-28 DIAGNOSIS — W1809XA Striking against other object with subsequent fall, initial encounter: Secondary | ICD-10-CM | POA: Insufficient documentation

## 2012-03-28 DIAGNOSIS — M549 Dorsalgia, unspecified: Secondary | ICD-10-CM

## 2012-03-28 LAB — URINALYSIS, ROUTINE W REFLEX MICROSCOPIC
Bilirubin Urine: NEGATIVE
Glucose, UA: NEGATIVE mg/dL
Hgb urine dipstick: NEGATIVE
Ketones, ur: NEGATIVE mg/dL
Nitrite: NEGATIVE
Protein, ur: NEGATIVE mg/dL
Specific Gravity, Urine: 1.014 (ref 1.005–1.030)
Urobilinogen, UA: 1 mg/dL (ref 0.0–1.0)
pH: 6 (ref 5.0–8.0)

## 2012-03-28 LAB — URINE MICROSCOPIC-ADD ON

## 2012-03-28 MED ORDER — MORPHINE SULFATE 4 MG/ML IJ SOLN
4.0000 mg | Freq: Once | INTRAMUSCULAR | Status: AC
  Administered 2012-03-28: 4 mg via INTRAVENOUS
  Filled 2012-03-28: qty 1

## 2012-03-28 MED ORDER — DIAZEPAM 5 MG PO TABS: 5.0000 mg | ORAL_TABLET | Freq: Two times a day (BID) | ORAL | Status: AC

## 2012-03-28 MED ORDER — NITROFURANTOIN MONOHYD MACRO 100 MG PO CAPS: 100.0000 mg | ORAL_CAPSULE | Freq: Two times a day (BID) | ORAL | Status: AC

## 2012-03-28 NOTE — ED Notes (Signed)
Ambulated pt to the restroom with the assistance of a walker.

## 2012-03-28 NOTE — Telephone Encounter (Signed)
Noted, advise that specimen can be collected at [facility] and treatment can be given while she is there if is needed.

## 2012-03-28 NOTE — ED Notes (Signed)
ZOX:[id number]<AV> Expected date:03/28/12<BR> Expected time: 1:49 PM<BR> Means of arrival:Ambulance<BR> Comments:<BR> fall

## 2012-03-28 NOTE — Telephone Encounter (Signed)
Caller: Ken/Spouse; PCP: Marga Melnick; CB#: [phone]; ; ; Call regarding Urine Sample;  Ken/Spouse calling regarding was to bring UA sample by office, was in office 03/28/12, pt fell and is now in [place] and unable to bring sample by.

## 2012-03-28 NOTE — ED Provider Notes (Signed)
History     CSN: [id number]  Arrival date & time 03/28/12  1415   First MD Initiated Contact with Patient 03/28/12 1432      Chief Complaint  Patient presents with  . Fall    (Consider location/radiation/quality/duration/timing/severity/associated sxs/prior treatment) HPI  Past Medical History  Diagnosis Date  . HTN (hypertension)   . Diverticulosis   . Fibromyalgia     Dr Azzie Roup, Rheu  . IBS (irritable bowel syndrome)   . Anxiety and depression   . GERD (gastroesophageal reflux disease)   . Lumbar radiculopathy   . Syncope     idiopathic, ? related to migraines or hyperventilation. Dr Pearlean Brownie, Neurology,  ONE TIME EPISODE AND THIS WAS YEARS AGO  . Obesity   . Anxiety   . Depression   . Heart murmur     mitral valve prolapse--no symptoms  . H/O hiatal hernia   . Headache     PAST HX MILD MIGRAINES  . Arthritis     Osteoarthritis, Dr Despina Hick    Past Surgical History  Procedure Date  . Abdominal hysterectomy     dysfunctional menses  . Appendectomy   . Cholecystectomy   . Knee arthroscopy 2008    right  . Colonoscopy 05/27/2002, 08/08/11    2003 diverticulosis, hemorrhoids 2012 same + cecal polyp  . Upper gastrointestinal endoscopy 04/21/2009, 08/08/11    2010 gastritis ;2012 small hiatal hernia. Dr Leone Payor  . Total hip arthroplasty 02/2010    left ; Dr Despina Hick  . Breast lumpectomy     right -BENIGN  . Total hip arthroplasty 03/18/2012    Procedure: TOTAL HIP ARTHROPLASTY;  Surgeon: Loanne Drilling, MD;  Location: [facility];  Service: Orthopedics;  Laterality: Right;    Family History  Problem Relation Age of Onset  . Colon cancer Neg Hx   . Heart disease Mother   . COPD Mother   . Cancer Mother     breast  . Hypertension Father   . Diabetes Father   . Heart disease Father     MI @ 34  . Cancer Paternal Aunt     breast with cns mets  . COPD Maternal Grandmother   . Heart disease Paternal Grandmother     died 50  . Stroke Paternal Grandfather   .  Diabetes Paternal Grandmother     History  Substance Use Topics  . Smoking status: Never Smoker   . Smokeless tobacco: Never Used  . Alcohol Use: Yes     couple of times a week     OB History    Grav Para Term Preterm Abortions TAB SAB Ect Mult Living                  Review of Systems  Allergies  Penicillins; Sulfamethoxazole w-trimethoprim; Achromycin; and Zanaflex  Home Medications   Current Outpatient Rx  Name Route Sig Dispense Refill  . ALPRAZOLAM 1 MG PO TABS Oral Take 1 mg by mouth 4 (four) times daily as needed. ANXIETY    . BENICAR 40 MG PO TABS  take 1 tablet by mouth once daily 90 tablet 2  . CYCLOBENZAPRINE HCL 10 MG PO TABS Oral Take 10 mg by mouth 4 (four) times daily as needed. SPASMS    . FLUOXETINE HCL 40 MG PO CAPS Oral Take 40 mg by mouth daily.     Marland Kitchen HYDROCODONE-ACETAMINOPHEN 5-325 MG PO TABS Oral Take 1-2 tablets by mouth every 6 (six) hours as needed. pain    .  IBUPROFEN 800 MG PO TABS Oral Take 800 mg by mouth every 8 (eight) hours as needed. pain    . METOPROLOL SUCCINATE ER 100 MG PO TB24  take 1 tablet by mouth daily 90 tablet 1  . OMEPRAZOLE 40 MG PO CPDR Oral Take 1 capsule (40 mg total) by mouth daily. 30 capsule 11  . OXYCODONE HCL 5 MG PO TABS Oral Take 1-2 tablets (5-10 mg total) by mouth every 4 (four) hours as needed for pain. 80 tablet 0  . RIVAROXABAN 10 MG PO TABS Oral Take 1 tablet (10 mg total) by mouth daily with breakfast. Take Xarelto for two and a half more weeks, then discontinue Xarelto. Once the patient has completed the Xarelto, they may resume the 325 mg Aspirin. 18 tablet 0  . DIAZEPAM 5 MG PO TABS Oral Take 1 tablet (5 mg total) by mouth 2 (two) times daily. 10 tablet 0    BP 131/74  Pulse 78  Temp 98.7 F (37.1 C) (Oral)  Resp 16  SpO2 95%  Physical Exam  ED Course  Procedures (including critical care time)  Labs Reviewed  URINALYSIS, ROUTINE W REFLEX MICROSCOPIC - Abnormal; Notable for the following:     APPearance CLOUDY (*)     Leukocytes, UA SMALL (*)     All other components within normal limits  URINE MICROSCOPIC-ADD ON - Abnormal; Notable for the following:    Bacteria, UA FEW (*)     All other components within normal limits   Dg Thoracic Spine 2 View  03/28/2012  *RADIOLOGY REPORT*  Clinical Data: 61 year old female status post fall with pain.  THORACIC SPINE - 2 VIEW  Comparison: Chest CT 09/08/2004.  Findings: Normal thoracic segmentation.  Thoracic vertebral height and alignment within normal limits.  Chronic endplate osteophytes. Cervicothoracic junction alignment is within normal limits. Grossly stable thoracic visceral contours.  IMPRESSION: No acute osseous abnormality in the thoracic spine.  Original Report Authenticated By: Harley Hallmark, M.D.   Dg Lumbar Spine Complete  03/28/2012  *RADIOLOGY REPORT*  Clinical Data: 61 year old female status post fall with pain.  LUMBAR SPINE - COMPLETE 4+ VIEW  Comparison: [facility] Lumbar MRI 09/06/2009.  Findings: Normal lumbar segmentation.  Osteopenia.  Stable and normal vertebral height alignment.  Stable and relatively preserved disc spaces.  No pars fracture.  Right upper quadrant surgical clips.  Bilateral total hip arthroplasties.  Sacral ala and SI joints appear within normal limits.  Moderate L4-L5 and L5-S1 facet hypertrophy.  IMPRESSION: No acute osseous abnormality in the lumbar spine.  Original Report Authenticated By: Harley Hallmark, M.D.   Dg Hip Complete Right  03/28/2012  *RADIOLOGY REPORT*  Clinical Data: Larey Seat. Right hip pain.  Hip replacement 03/18/2012.  RIGHT HIP - COMPLETE 2+ VIEW  Comparison: 03/18/2012.  Findings: Post bilateral hip replacements.  No fracture or dislocation.  Left hip replacement is not examined in its entirety.  IMPRESSION: Post bilateral hip replacements.  No fracture or dislocation.  Left hip replacement is not examined in its entirety.  Original Report Authenticated By: Fuller Canada, M.D.   Ct Head Wo Contrast  03/28/2012  *RADIOLOGY REPORT*  Clinical Data:  Fall.  Head trauma.  Right rib and right hip pain. Cervical spine pain.  CT HEAD WITHOUT CONTRAST CT CERVICAL SPINE WITHOUT CONTRAST  Technique:  Multidetector CT imaging of the head and cervical spine was performed following the standard protocol without intravenous contrast.  Multiplanar CT image reconstructions of the cervical  spine were also generated.  Comparison:   None  CT HEAD  Findings: No mass lesion, mass effect, midline shift, hydrocephalus, hemorrhage.  No territorial ischemia or acute infarction.  Calvarium intact.  Mastoid air cells clear.  IMPRESSION: No interval change or acute intracranial abnormality.  CT CERVICAL SPINE  Findings: Mild dextroconvex curvature of the cervical spine.  No aggressive osseous lesions.  There is no cervical spine fracture or dislocation.  Prominent vascular channel is present in the right C1 lateral mass on the coronal limb craniocervical alignment appears normal.  Multilevel disc osteophyte complexes are present extending from C4-C5 through C6-C7.  Central stenosis is mild.  Foraminal stenosis most pronounced at C6-C7 on the left and bilaterally at C5- C6.  Carotid atherosclerosis incidentally noted.  IMPRESSION: No cervical spine fracture or dislocation.  Mild multilevel cervical spondylosis  Original Report Authenticated By: Andreas Newport, M.D.   Ct Cervical Spine Wo Contrast  03/28/2012  *RADIOLOGY REPORT*  Clinical Data:  Fall.  Head trauma.  Right rib and right hip pain. Cervical spine pain.  CT HEAD WITHOUT CONTRAST CT CERVICAL SPINE WITHOUT CONTRAST  Technique:  Multidetector CT imaging of the head and cervical spine was performed following the standard protocol without intravenous contrast.  Multiplanar CT image reconstructions of the cervical spine were also generated.  Comparison:   None  CT HEAD  Findings: No mass lesion, mass effect, midline shift, hydrocephalus,  hemorrhage.  No territorial ischemia or acute infarction.  Calvarium intact.  Mastoid air cells clear.  IMPRESSION: No interval change or acute intracranial abnormality.  CT CERVICAL SPINE  Findings: Mild dextroconvex curvature of the cervical spine.  No aggressive osseous lesions.  There is no cervical spine fracture or dislocation.  Prominent vascular channel is present in the right C1 lateral mass on the coronal limb craniocervical alignment appears normal.  Multilevel disc osteophyte complexes are present extending from C4-C5 through C6-C7.  Central stenosis is mild.  Foraminal stenosis most pronounced at C6-C7 on the left and bilaterally at C5- C6.  Carotid atherosclerosis incidentally noted.  IMPRESSION: No cervical spine fracture or dislocation.  Mild multilevel cervical spondylosis  Original Report Authenticated By: Andreas Newport, M.D.     1. Fall   2. Hip pain   3. Back pain   4. Neck pain       MDM  Urinalysis shows minor infection. Macrobid for 5 days.        Donnetta Hutching, MD 03/28/12 1747

## 2012-03-28 NOTE — ED Provider Notes (Signed)
History     CSN: [id number]  Arrival date & time 03/28/12  1415   First MD Initiated Contact with Patient 03/28/12 1432      Chief Complaint  Patient presents with  . Fall    (Consider location/radiation/quality/duration/timing/severity/associated sxs/prior treatment) HPI  H/o MVP, DJD s/p recent Rt hip arthroplasty (7/1) on xarelto presents after fall. She states that she is using her walker and trying to step past surgical she states about without move. She tripped over the dog lost her balance and fell backwards. She states she landed on her bottom and on her right side. She may have hit her head on the wooden part of the couch. She is multiple complaints including headache, neck pain, upper back pain, lower back pain, right hip pain. She denies chest pain, shortness of breath, abdominal pain, nausea, vomiting. She denies new numbness, tingling, weakness of extremities. Rates pain as a 7/10 at this time. She's been taking Vicodin at home prior to this episode. She also has prescription for Flexeril and Xanax at home.  Past Medical History  Diagnosis Date  . HTN (hypertension)   . Diverticulosis   . Fibromyalgia     Dr Azzie Roup, Rheu  . IBS (irritable bowel syndrome)   . Anxiety and depression   . GERD (gastroesophageal reflux disease)   . Lumbar radiculopathy   . Syncope     idiopathic, ? related to migraines or hyperventilation. Dr Pearlean Brownie, Neurology,  ONE TIME EPISODE AND THIS WAS YEARS AGO  . Obesity   . Anxiety   . Depression   . Heart murmur     mitral valve prolapse--no symptoms  . H/O hiatal hernia   . Headache     PAST HX MILD MIGRAINES  . Arthritis     Osteoarthritis, Dr Despina Hick    Past Surgical History  Procedure Date  . Abdominal hysterectomy     dysfunctional menses  . Appendectomy   . Cholecystectomy   . Knee arthroscopy 2008    right  . Colonoscopy 05/27/2002, 08/08/11    2003 diverticulosis, hemorrhoids 2012 same + cecal polyp  . Upper  gastrointestinal endoscopy 04/21/2009, 08/08/11    2010 gastritis ;2012 small hiatal hernia. Dr Leone Payor  . Total hip arthroplasty 02/2010    left ; Dr Despina Hick  . Breast lumpectomy     right -BENIGN  . Total hip arthroplasty 03/18/2012    Procedure: TOTAL HIP ARTHROPLASTY;  Surgeon: Loanne Drilling, MD;  Location: [facility];  Service: Orthopedics;  Laterality: Right;    Family History  Problem Relation Age of Onset  . Colon cancer Neg Hx   . Heart disease Mother   . COPD Mother   . Cancer Mother     breast  . Hypertension Father   . Diabetes Father   . Heart disease Father     MI @ 37  . Cancer Paternal Aunt     breast with cns mets  . COPD Maternal Grandmother   . Heart disease Paternal Grandmother     died 75  . Stroke Paternal Grandfather   . Diabetes Paternal Grandmother     History  Substance Use Topics  . Smoking status: Never Smoker   . Smokeless tobacco: Never Used  . Alcohol Use: Yes     couple of times a week     OB History    Grav Para Term Preterm Abortions TAB SAB Ect Mult Living  Review of Systems  All other systems reviewed and are negative.   except as noted HPI   Allergies  Penicillins; Sulfamethoxazole w-trimethoprim; Achromycin; and Zanaflex  Home Medications   Current Outpatient Rx  Name Route Sig Dispense Refill  . ALPRAZOLAM 1 MG PO TABS Oral Take 1 mg by mouth 4 (four) times daily as needed. ANXIETY    . BENICAR 40 MG PO TABS  take 1 tablet by mouth once daily 90 tablet 2  . CYCLOBENZAPRINE HCL 10 MG PO TABS Oral Take 10 mg by mouth 4 (four) times daily as needed. SPASMS    . FLUOXETINE HCL 40 MG PO CAPS Oral Take 40 mg by mouth daily.     Marland Kitchen HYDROCODONE-ACETAMINOPHEN 5-325 MG PO TABS Oral Take 1-2 tablets by mouth every 6 (six) hours as needed. pain    . IBUPROFEN 800 MG PO TABS Oral Take 800 mg by mouth every 8 (eight) hours as needed. pain    . METOPROLOL SUCCINATE ER 100 MG PO TB24  take 1 tablet by mouth daily 90 tablet  1  . OMEPRAZOLE 40 MG PO CPDR Oral Take 1 capsule (40 mg total) by mouth daily. 30 capsule 11  . OXYCODONE HCL 5 MG PO TABS Oral Take 1-2 tablets (5-10 mg total) by mouth every 4 (four) hours as needed for pain. 80 tablet 0  . RIVAROXABAN 10 MG PO TABS Oral Take 1 tablet (10 mg total) by mouth daily with breakfast. Take Xarelto for two and a half more weeks, then discontinue Xarelto. Once the patient has completed the Xarelto, they may resume the 325 mg Aspirin. 18 tablet 0  . DIAZEPAM 5 MG PO TABS Oral Take 1 tablet (5 mg total) by mouth 2 (two) times daily. 10 tablet 0  . DIAZEPAM 5 MG PO TABS Oral Take 1 tablet (5 mg total) by mouth 2 (two) times daily. 10 tablet 0  . NITROFURANTOIN MONOHYD MACRO 100 MG PO CAPS Oral Take 1 capsule (100 mg total) by mouth 2 (two) times daily. X 7 days 10 capsule 0    BP 136/63  Pulse 76  Temp 98.4 F (36.9 C) (Oral)  Resp 16  SpO2 90%  Physical Exam  Nursing note and vitals reviewed. Constitutional: She is oriented to person, place, and time. She appears well-developed. No distress.       c collar in place Back brace in place Pt log rolled on arrival  HENT:  Head: Atraumatic.  Mouth/Throat: Oropharynx is clear and moist.  Eyes: Conjunctivae and EOM are normal. Pupils are equal, round, and reactive to light.  Neck: Neck supple.    Cardiovascular: Normal rate, regular rhythm and intact distal pulses.   Pulmonary/Chest: Effort normal and breath sounds normal. No respiratory distress. She has no wheezes. She has no rales.  Abdominal: Soft. She exhibits no distension. There is no tenderness. There is no rebound and no guarding.  Musculoskeletal: Normal range of motion. She exhibits tenderness. She exhibits no edema.       Arms:      Pelvis stable. +Rt hip ttp. Surgical incision c/d/i. Pain with int/ext rotation RLE. Gross sensation intact. Cap refill < 3 sec.  Strength 5/5 LLE 4+/5 RLE (limited by pain)  Lymphadenopathy:    She has no cervical  adenopathy.  Neurological: She is alert and oriented to person, place, and time.  Skin: Skin is warm and dry. No rash noted.  Psychiatric: She has a normal mood and affect.  ED Course  Procedures (including critical care time)  Labs Reviewed  URINALYSIS, ROUTINE W REFLEX MICROSCOPIC - Abnormal; Notable for the following:    APPearance CLOUDY (*)     Leukocytes, UA SMALL (*)     All other components within normal limits  URINE MICROSCOPIC-ADD ON - Abnormal; Notable for the following:    Bacteria, UA FEW (*)     All other components within normal limits  URINE CULTURE   Dg Thoracic Spine 2 View  03/28/2012  *RADIOLOGY REPORT*  Clinical Data: 61 year old female status post fall with pain.  THORACIC SPINE - 2 VIEW  Comparison: Chest CT 09/08/2004.  Findings: Normal thoracic segmentation.  Thoracic vertebral height and alignment within normal limits.  Chronic endplate osteophytes. Cervicothoracic junction alignment is within normal limits. Grossly stable thoracic visceral contours.  IMPRESSION: No acute osseous abnormality in the thoracic spine.  Original Report Authenticated By: Harley Hallmark, M.D.   Dg Lumbar Spine Complete  03/28/2012  *RADIOLOGY REPORT*  Clinical Data: 61 year old female status post fall with pain.  LUMBAR SPINE - COMPLETE 4+ VIEW  Comparison: [facility] Lumbar MRI 09/06/2009.  Findings: Normal lumbar segmentation.  Osteopenia.  Stable and normal vertebral height alignment.  Stable and relatively preserved disc spaces.  No pars fracture.  Right upper quadrant surgical clips.  Bilateral total hip arthroplasties.  Sacral ala and SI joints appear within normal limits.  Moderate L4-L5 and L5-S1 facet hypertrophy.  IMPRESSION: No acute osseous abnormality in the lumbar spine.  Original Report Authenticated By: Harley Hallmark, M.D.   Dg Hip Complete Right  03/28/2012  *RADIOLOGY REPORT*  Clinical Data: Larey Seat. Right hip pain.  Hip replacement 03/18/2012.   RIGHT HIP - COMPLETE 2+ VIEW  Comparison: 03/18/2012.  Findings: Post bilateral hip replacements.  No fracture or dislocation.  Left hip replacement is not examined in its entirety.  IMPRESSION: Post bilateral hip replacements.  No fracture or dislocation.  Left hip replacement is not examined in its entirety.  Original Report Authenticated By: Fuller Canada, M.D.   Ct Head Wo Contrast  03/28/2012  *RADIOLOGY REPORT*  Clinical Data:  Fall.  Head trauma.  Right rib and right hip pain. Cervical spine pain.  CT HEAD WITHOUT CONTRAST CT CERVICAL SPINE WITHOUT CONTRAST  Technique:  Multidetector CT imaging of the head and cervical spine was performed following the standard protocol without intravenous contrast.  Multiplanar CT image reconstructions of the cervical spine were also generated.  Comparison:   None  CT HEAD  Findings: No mass lesion, mass effect, midline shift, hydrocephalus, hemorrhage.  No territorial ischemia or acute infarction.  Calvarium intact.  Mastoid air cells clear.  IMPRESSION: No interval change or acute intracranial abnormality.  CT CERVICAL SPINE  Findings: Mild dextroconvex curvature of the cervical spine.  No aggressive osseous lesions.  There is no cervical spine fracture or dislocation.  Prominent vascular channel is present in the right C1 lateral mass on the coronal limb craniocervical alignment appears normal.  Multilevel disc osteophyte complexes are present extending from C4-C5 through C6-C7.  Central stenosis is mild.  Foraminal stenosis most pronounced at C6-C7 on the left and bilaterally at C5- C6.  Carotid atherosclerosis incidentally noted.  IMPRESSION: No cervical spine fracture or dislocation.  Mild multilevel cervical spondylosis  Original Report Authenticated By: Andreas Newport, M.D.   Ct Cervical Spine Wo Contrast  03/28/2012  *RADIOLOGY REPORT*  Clinical Data:  Fall.  Head trauma.  Right rib and right hip pain. Cervical spine  pain.  CT HEAD WITHOUT CONTRAST CT  CERVICAL SPINE WITHOUT CONTRAST  Technique:  Multidetector CT imaging of the head and cervical spine was performed following the standard protocol without intravenous contrast.  Multiplanar CT image reconstructions of the cervical spine were also generated.  Comparison:   None  CT HEAD  Findings: No mass lesion, mass effect, midline shift, hydrocephalus, hemorrhage.  No territorial ischemia or acute infarction.  Calvarium intact.  Mastoid air cells clear.  IMPRESSION: No interval change or acute intracranial abnormality.  CT CERVICAL SPINE  Findings: Mild dextroconvex curvature of the cervical spine.  No aggressive osseous lesions.  There is no cervical spine fracture or dislocation.  Prominent vascular channel is present in the right C1 lateral mass on the coronal limb craniocervical alignment appears normal.  Multilevel disc osteophyte complexes are present extending from C4-C5 through C6-C7.  Central stenosis is mild.  Foraminal stenosis most pronounced at C6-C7 on the left and bilaterally at C5- C6.  Carotid atherosclerosis incidentally noted.  IMPRESSION: No cervical spine fracture or dislocation.  Mild multilevel cervical spondylosis  Original Report Authenticated By: Andreas Newport, M.D.     1. Fall   2. Hip pain   3. Back pain   4. Neck pain   5. Urinary tract infection    MDM  Multiple blunt trauma after fall on anticoagulation. No acute fx. No ICH. Pain control. No EMC precluding discharge at this time. Given Precautions for return. PMD and ortho f/u. Has vicodin and flexeril, xanax at home. Valium given--knows to avoid flexeril/xanax/valium combination. U/A pending Dr. Adriana Simas followed and found to have UTI- abx prescribed and urine cx sent.         Forbes Cellar, MD 03/30/12 [phone]

## 2012-03-28 NOTE — ED Notes (Signed)
Awaiting results of UA.

## 2012-03-28 NOTE — ED Notes (Signed)
Patient transported to X-ray 

## 2012-03-28 NOTE — Telephone Encounter (Signed)
Patient just had hip replacement surgery 7.1.13, [facility] nurse came out and stated patient need to call PCP as she is having some irritation/burning during urination. Patient call back [phone] FYI patient cannot move well enough to come in for appt., without sever pain

## 2012-03-28 NOTE — ED Notes (Signed)
Per EMS: Pt from home. Walking to kitchen using walker and stepped over her dog and lost balance. Fell backwards, landing on bottom and right side. Hit head on wooden part of couch. Pain to back right rib cage and right hip pain.  Right hip replacement on 03/18/12.  c-collar in place

## 2012-03-28 NOTE — Telephone Encounter (Signed)
Advise Pt that a specimen cup can be placed up front for her to pick up to complete samples at home and return to office once complete. Pt ok and is aware that if urine is sent for culture it can take 3-5 days until results are available. Pt ok will pick up specimen cup.

## 2012-04-01 LAB — URINE CULTURE: Colony Count: >=100000

## 2012-04-02 NOTE — ED Notes (Signed)
+   Urine] Patient treated with Macrobid-sensitive to same-chart appended per protocol MD. 

## 2012-05-13 ENCOUNTER — Other Ambulatory Visit: Payer: Self-pay | Admitting: Internal Medicine

## 2012-06-10 ENCOUNTER — Other Ambulatory Visit: Payer: Self-pay | Admitting: Obstetrics and Gynecology

## 2012-06-10 DIAGNOSIS — Z1231 Encounter for screening mammogram for malignant neoplasm of breast: Secondary | ICD-10-CM

## 2012-06-24 ENCOUNTER — Ambulatory Visit
Admission: RE | Admit: 2012-06-24 | Discharge: 2012-06-24 | Disposition: A | Discharge status: Home / Self Care | Payer: 59 | Source: Ambulatory Visit | Attending: Obstetrics and Gynecology | Admitting: Obstetrics and Gynecology

## 2012-06-24 DIAGNOSIS — Z1231 Encounter for screening mammogram for malignant neoplasm of breast: Secondary | ICD-10-CM

## 2012-07-08 ENCOUNTER — Other Ambulatory Visit: Payer: Self-pay | Admitting: Internal Medicine

## 2012-09-07 ENCOUNTER — Other Ambulatory Visit: Payer: Self-pay | Admitting: Internal Medicine

## 2012-11-02 ENCOUNTER — Other Ambulatory Visit: Payer: Self-pay

## 2012-11-13 ENCOUNTER — Other Ambulatory Visit: Payer: Self-pay | Admitting: Internal Medicine

## 2013-01-09 ENCOUNTER — Other Ambulatory Visit: Payer: Self-pay | Admitting: Internal Medicine

## 2013-01-10 NOTE — Telephone Encounter (Signed)
Med filled.  

## 2013-01-22 ENCOUNTER — Ambulatory Visit (INDEPENDENT_AMBULATORY_CARE_PROVIDER_SITE_OTHER): Payer: 59 | Admitting: Internal Medicine

## 2013-01-22 ENCOUNTER — Encounter: Payer: Self-pay | Admitting: Internal Medicine

## 2013-01-22 VITALS — BP 136/88 | HR 82 | Resp 13 | Wt 283.0 lb

## 2013-01-22 DIAGNOSIS — D649 Anemia, unspecified: Secondary | ICD-10-CM | POA: Insufficient documentation

## 2013-01-22 DIAGNOSIS — I10 Essential (primary) hypertension: Secondary | ICD-10-CM

## 2013-01-22 DIAGNOSIS — R7309 Other abnormal glucose: Secondary | ICD-10-CM

## 2013-01-22 LAB — CBC WITH DIFFERENTIAL/PLATELET
Basophils Absolute: 0 10*3/uL (ref 0.0–0.1)
Basophils Relative: 0.3 % (ref 0.0–3.0)
Eosinophils Absolute: 0.2 10*3/uL (ref 0.0–0.7)
Eosinophils Relative: 3.3 % (ref 0.0–5.0)
HCT: 38.2 % (ref 36.0–46.0)
Hemoglobin: 12.9 g/dL (ref 12.0–15.0)
Lymphocytes Relative: 24.6 % (ref 12.0–46.0)
Lymphs Abs: 1.3 10*3/uL (ref 0.7–4.0)
MCHC: 33.7 g/dL (ref 30.0–36.0)
MCV: 89.3 fl (ref 78.0–100.0)
Monocytes Absolute: 0.5 10*3/uL (ref 0.1–1.0)
Monocytes Relative: 9.1 % (ref 3.0–12.0)
Neutro Abs: 3.3 10*3/uL (ref 1.4–7.7)
Neutrophils Relative %: 62.7 % (ref 43.0–77.0)
Platelets: 228 10*3/uL (ref 150.0–400.0)
RBC: 4.28 Mil/uL (ref 3.87–5.11)
RDW: 15.1 % — ABNORMAL HIGH (ref 11.5–14.6)
WBC: 5.3 10*3/uL (ref 4.5–10.5)

## 2013-01-22 LAB — LIPID PANEL
Cholesterol: 208 mg/dL — ABNORMAL HIGH (ref 0–200)
HDL: 46.4 mg/dL (ref >39.00)
Total CHOL/HDL Ratio: 4
Triglycerides: 174 mg/dL — ABNORMAL HIGH (ref 0.0–149.0)
VLDL: 34.8 mg/dL (ref 0.0–40.0)

## 2013-01-22 LAB — HEPATIC FUNCTION PANEL
ALT: 31 U/L (ref 0–35)
AST: 25 U/L (ref 0–37)
Albumin: 3.7 g/dL (ref 3.5–5.2)
Alkaline Phosphatase: 81 U/L (ref 39–117)
Bilirubin, Direct: 0 mg/dL (ref 0.0–0.3)
Total Bilirubin: 0.5 mg/dL (ref 0.3–1.2)
Total Protein: 6.6 g/dL (ref 6.0–8.3)

## 2013-01-22 LAB — BASIC METABOLIC PANEL
BUN: 20 mg/dL (ref 6–23)
CO2: 27 mEq/L (ref 19–32)
Calcium: 8.8 mg/dL (ref 8.4–10.5)
Chloride: 105 mEq/L (ref 96–112)
Creatinine, Ser: 0.9 mg/dL (ref 0.4–1.2)
GFR: 69.4 mL/min (ref >60.00)
Glucose, Bld: 79 mg/dL (ref 70–99)
Potassium: 4 mEq/L (ref 3.5–5.1)
Sodium: 140 mEq/L (ref 135–145)

## 2013-01-22 LAB — TSH: TSH: 1.14 u[IU]/mL (ref 0.35–5.50)

## 2013-01-22 LAB — LDL CHOLESTEROL, DIRECT: Direct LDL: 143.2 mg/dL

## 2013-01-22 LAB — HEMOGLOBIN A1C: Hgb A1c MFr Bld: 5.6 % (ref 4.6–6.5)

## 2013-01-22 MED ORDER — OLMESARTAN MEDOXOMIL 40 MG PO TABS: ORAL_TABLET | ORAL | Status: DC

## 2013-01-22 MED ORDER — METOPROLOL SUCCINATE ER 100 MG PO TB24: ORAL_TABLET | ORAL | Status: DC

## 2013-01-22 NOTE — Assessment & Plan Note (Signed)
Recheck CBC & dif 

## 2013-01-22 NOTE — Progress Notes (Signed)
  Subjective:    Patient ID: Donata Clay, female    DOB: [birth year], 62 y.o.   MRN: [id number]  HPI  Blood pressure is 140s/ 80-90s at home;no average monitored  Epistaxis, headache, lightheadedness,chest pain (see below), palpitations, dyspnea, edema, and claudication are not present except for intermittent L ankle swelling.  Medication compliance is good  Adverse medication effects are not noted.  Sodium restriction is employed  No cardiovascular exercise ; previously in water Zumba         Review of Systems  She has seasonal allergies; the recent flare has been associated with itchy, watery eyes and sneezing. She also has postnasal drainage which is induced cough. This is been associated with some substernal discomfort. She questions some wheezing at night. Previously she had taken Claritin but is not employing any medications at present.  Reviewing her chart indicates that she was anemic following her total hip replacement in July. There has been no update on the status of the anemia. Her colonoscopy and endoscopy studies are up-to-date. She has no unexplained weight loss, melena, rectal bleeding.  Also during hospitalization she was found to have random glucoses as high as 170 range. She denies polyuria, polydipsia, or polyphagia.     Objective:   Physical Exam Gen.:  well-nourished in appearance. Alert, appropriate and cooperative throughout exam.  Eyes: No corneal or conjunctival inflammation noted.  Ears: External  ear exam reveals no significant lesions or deformities. Canals clear .TMs normal. Hearing is grossly normal bilaterally. Nose: External nasal exam reveals no deformity or inflammation. Nasal mucosa are pink and moist. No lesions or exudates noted.   Mouth: Oral mucosa and oropharynx reveal no lesions or exudates. Teeth in good repair. Neck: No deformities, masses, or tenderness noted. Thyroid normal. Lungs: Normal respiratory effort; chest expands  symmetrically. Lungs are clear to auscultation without rales, wheezes, or increased work of breathing. Heart: Normal rate and rhythm. Normal S1 and S2. No gallop, click, or rub. Grade 1/6 systolic murmur. Abdomen: Bowel sounds normal; abdomen soft and nontender. No masses, organomegaly or hernias noted.Umbilical jewelry                                  Musculoskeletal/extremities: No deformity or scoliosis noted of  the thoracic or lumbar spine.  No clubbing, cyanosis, edema, or significant extremity  deformity noted. Tone & strength  Normal. Joints normal / reveal mild  DJD DIP changes. Nail health good. Able to lie down & sit up w/o help. Vascular: Carotid, radial artery, dorsalis pedis and  posterior tibial pulses are full and equal. No bruits present. Neurologic: Alert and oriented x3. Deep tendon reflexes symmetrical but 0-1/2+ @ knees.        Skin: Intact without suspicious lesions or rashes.Well tanned Lymph: No cervical, axillary lymphadenopathy present. Psych: Mood and affect are normal. Normally interactive                                                                                       Assessment & Plan:

## 2013-01-22 NOTE — Assessment & Plan Note (Signed)
Blood pressure appears to be well controlled; she did not take her medications today.  BMET

## 2013-01-22 NOTE — Patient Instructions (Addendum)
Minimal Blood Pressure Goal= AVERAGE < 140/90;  Ideal is an AVERAGE < 135/85. This AVERAGE should be calculated from @ least 5-7 BP readings taken @ different times of day on different days of week. You should not respond to isolated BP readings , but rather the AVERAGE for that week .Please bring your  blood pressure cuff to office visits to verify that it is reliable.It  can also be checked against the blood pressure device at the pharmacy. Finger or wrist cuffs are not dependable; an arm cuff is.  If you activate the  My Chart system; lab & Xray results will be released directly  to you as soon as I review & address these through the computer. If you choose not to sign up for My Chart within 36 hours of labs being drawn; results will be reviewed & interpretation added before being copied & mailed, causing a delay in getting the results to you.If you do not receive that report within 7-10 days ,please call. Additionally you can use this system to gain direct  access to your records  if  out of town or @ an office of a  physician who is not in  the My Chart network.  This improves continuity of care & places you in control of your medical record.  Share results with all non [facility] medical staff seen

## 2013-04-09 ENCOUNTER — Other Ambulatory Visit: Payer: Self-pay | Admitting: Internal Medicine

## 2013-06-11 ENCOUNTER — Other Ambulatory Visit: Payer: Self-pay | Admitting: Internal Medicine

## 2013-07-10 ENCOUNTER — Other Ambulatory Visit: Payer: Self-pay | Admitting: Internal Medicine

## 2013-07-14 ENCOUNTER — Telehealth: Payer: Self-pay | Admitting: Internal Medicine

## 2013-07-14 ENCOUNTER — Other Ambulatory Visit: Payer: Self-pay

## 2013-07-14 DIAGNOSIS — Z1231 Encounter for screening mammogram for malignant neoplasm of breast: Secondary | ICD-10-CM

## 2013-07-14 MED ORDER — OMEPRAZOLE 40 MG PO CPDR: DELAYED_RELEASE_CAPSULE | ORAL | Status: DC

## 2013-07-14 NOTE — Telephone Encounter (Signed)
rx sent

## 2013-07-19 HISTORY — PX: MELANOMA EXCISION: SHX5266

## 2013-07-23 ENCOUNTER — Other Ambulatory Visit: Payer: Self-pay | Admitting: Dermatology

## 2013-07-24 ENCOUNTER — Other Ambulatory Visit: Payer: Self-pay

## 2013-08-11 ENCOUNTER — Other Ambulatory Visit: Payer: Self-pay | Admitting: Internal Medicine

## 2013-08-11 ENCOUNTER — Ambulatory Visit
Admission: RE | Admit: 2013-08-11 | Discharge: 2013-08-11 | Disposition: A | Discharge status: Home / Self Care | Payer: 59 | Source: Ambulatory Visit

## 2013-08-11 DIAGNOSIS — Z1231 Encounter for screening mammogram for malignant neoplasm of breast: Secondary | ICD-10-CM

## 2013-08-20 ENCOUNTER — Ambulatory Visit (INDEPENDENT_AMBULATORY_CARE_PROVIDER_SITE_OTHER): Payer: 59 | Admitting: Internal Medicine

## 2013-08-20 ENCOUNTER — Encounter: Payer: Self-pay | Admitting: Internal Medicine

## 2013-08-20 VITALS — BP 168/92 | HR 80 | Ht 66.0 in | Wt 282.6 lb

## 2013-08-20 DIAGNOSIS — K219 Gastro-esophageal reflux disease without esophagitis: Secondary | ICD-10-CM

## 2013-08-20 MED ORDER — FAMOTIDINE-CA CARB-MAG HYDROX 10-800-165 MG PO CHEW: CHEWABLE_TABLET | ORAL | Status: DC

## 2013-08-20 NOTE — Assessment & Plan Note (Signed)
Mostly controlled. She will continue her daily PPI and we will add generic Pepcid complete over-the-counter to be taken to prevent or treat heartburn that occurs at night. Continue lifestyle measures as well. I will refill her omeprazole.

## 2013-08-20 NOTE — Patient Instructions (Signed)
Use Pepcid Complete as needed per Dr. Leone Payor.  It is the time of year to have a vaccination to prevent the flu (influenza virus).  Please have this done through your primary care provider or you can get this done at local pharmacies or the [facility]. It would be very helpful if you notify your primary care provider when and where you had the vaccination given by messaging them in My Chart, leaving a message or faxing the information.     I appreciate the opportunity to care for you.

## 2013-08-20 NOTE — Progress Notes (Signed)
         Subjective:    Patient ID: Kathy [name], female    DOB: 07-23-1951, 62 y.o.   MRN: [id number]  HPI The patient is a pleasant middle-aged white woman followed for GERD. She is doing well most days, however 2-3 nights out of the week she awakened with some heartburn and reflux symptoms that are controlled by columns. She says she can usually look back and realize she ate a more reflux inducing foods such as tomato soup for something else with tomatoes and it. She's not using caffeine and she doesn't smoke. She know she is obese and should lose weight but she's had a very stressful time lately with deaths in the family and other issues. Medications, allergies, past medical history, past surgical history, family history and social history are reviewed and updated in the EMR.   Review of Systems Melanoma excision left foot this past week    Objective:   Physical Exam Pleasant well-developed well-nourished obese white woman in no acute distress    Assessment & Plan:

## 2013-08-31 ENCOUNTER — Encounter: Payer: Self-pay | Admitting: Internal Medicine

## 2013-08-31 DIAGNOSIS — C439 Malignant melanoma of skin, unspecified: Secondary | ICD-10-CM | POA: Insufficient documentation

## 2013-09-12 ENCOUNTER — Other Ambulatory Visit: Payer: Self-pay | Admitting: Internal Medicine

## 2013-11-11 ENCOUNTER — Ambulatory Visit: Payer: 59 | Admitting: Internal Medicine

## 2013-12-05 ENCOUNTER — Encounter: Payer: Self-pay | Admitting: Internal Medicine

## 2013-12-05 ENCOUNTER — Ambulatory Visit (INDEPENDENT_AMBULATORY_CARE_PROVIDER_SITE_OTHER): Payer: 59 | Admitting: Internal Medicine

## 2013-12-05 VITALS — BP 150/100 | HR 73 | Temp 98.1°F | Resp 20 | Ht 66.0 in | Wt 283.0 lb

## 2013-12-05 DIAGNOSIS — IMO0001 Reserved for inherently not codable concepts without codable children: Secondary | ICD-10-CM

## 2013-12-05 DIAGNOSIS — K589 Irritable bowel syndrome without diarrhea: Secondary | ICD-10-CM

## 2013-12-05 DIAGNOSIS — I10 Essential (primary) hypertension: Secondary | ICD-10-CM

## 2013-12-05 DIAGNOSIS — Z86010 Personal history of colonic polyps: Secondary | ICD-10-CM

## 2013-12-05 DIAGNOSIS — E669 Obesity, unspecified: Secondary | ICD-10-CM

## 2013-12-05 NOTE — Patient Instructions (Signed)

## 2013-12-05 NOTE — Progress Notes (Signed)
Pre-visit discussion using our clinic review tool. No additional management support is needed unless otherwise documented below in the visit note.  

## 2013-12-05 NOTE — Progress Notes (Signed)
Subjective:    Patient ID: Kathy [name], female    DOB: [birth year], 63 y.o.   MRN: [id number]  HPI 63 year old patient who is seen today as a new patient.   Her father is a patient at this practice location.  She is followed closely by GI due to colonic polyps and GERD.  She is followed by rheumatology.  Due to fibromyalgia.  She also has a history of osteoarthritis.  She has treated hypertension, which has been controlled on in the car.  Additionally, she has a history of obesity.  In general, doing quite well today.  She has not taken her blood pressure medication today, and blood pressure was noted to be elevated She also has a history of melanoma involving the dorsal aspect of the left foot.  She is followed annually by dermatology.  Past Medical History  Diagnosis Date  . HTN (hypertension)   . Diverticulosis   . Fibromyalgia     Dr Tobie Lords, Rheu  . IBS (irritable bowel syndrome)   . Anxiety and depression   . GERD (gastroesophageal reflux disease)   . Lumbar radiculopathy   . Syncope     idiopathic, ? related to migraines or hyperventilation. Dr Leonie Man, Neurology,  ONE TIME EPISODE AND THIS WAS YEARS AGO  . Obesity   . Anxiety   . Depression   . Heart murmur     mitral valve prolapse--no symptoms  . H/O hiatal hernia   . Headache(784.0)     PAST HX MILD MIGRAINES  . Arthritis     Osteoarthritis, Dr Maureen Ralphs  . Adenomatous colon polyp     2012  . Internal hemorrhoid     History   Social History  . Marital Status: Married    Spouse Name: N/A    Number of Children: N/A  . Years of Education: N/A   Occupational History  . Not on file.   Social History Main Topics  . Smoking status: Never Smoker   . Smokeless tobacco: Never Used  . Alcohol Use: Yes     Comment: occasional  . Drug Use: No  . Sexual Activity: Not on file   Other Topics Concern  . Not on file   Social History Narrative   0 caffeine drinks     Past Surgical History  Procedure  Laterality Date  . Abdominal hysterectomy      dysfunctional menses  . Appendectomy    . Cholecystectomy    . Knee arthroscopy  2008    right  . Colonoscopy  05/27/2002, 08/08/11    2003 diverticulosis, hemorrhoids 2012 same + cecal polyp  . Upper gastrointestinal endoscopy  04/21/2009, 08/08/11    2010 gastritis ;2012 small hiatal hernia. Dr Carlean Purl  . Total hip arthroplasty  02/2010    left ; Dr Maureen Ralphs  . Breast lumpectomy      right -BENIGN  . Total hip arthroplasty  03/18/2012    Procedure: TOTAL HIP ARTHROPLASTY;  Surgeon: Gearlean Alf, MD;  Location: [facility];  Service: Orthopedics;  Laterality: Right;  . Melanoma excision Left 07/2013    foot    Family History  Problem Relation Age of Onset  . Colon cancer Neg Hx   . Heart attack Mother 51  . COPD Mother   . Breast cancer Mother   . Hypertension Father   . Diabetes Father   . Heart failure Father 70  . Breast cancer Paternal Aunt     cns mets  .  COPD Maternal Grandmother   . Heart disease Paternal Grandmother     died 36  . Stroke Paternal Grandfather     in late 77s  . Diabetes Paternal Grandmother   . Heart attack Brother 106    Allergies  Allergen Reactions  . Penicillins     ? Facial angioedema  . Sulfamethoxazole-Trimethoprim     hives  . Achromycin [Tetracycline Hcl]     Dyspnea    . Zanaflex [Tizanidine Hydrochloride]     Does not recall reaction type     Current Outpatient Prescriptions on File Prior to Visit  Medication Sig Dispense Refill  . ALPRAZolam (XANAX) 1 MG tablet Take 1 mg by mouth 4 (four) times daily as needed. ANXIETY      . aspirin 325 MG tablet Take 325 mg by mouth daily.      Marland Kitchen CALCIUM PO Take 1 capsule by mouth daily.      . cyclobenzaprine (FLEXERIL) 10 MG tablet Take 10 mg by mouth 4 (four) times daily as needed. SPASMS      . famotidine-calcium carbonate-magnesium hydroxide (PEPCID COMPLETE) 10-800-165 MG CHEW chewable tablet 1 -2 tablets as needed to prevent or treat  heartburn      . FLUoxetine (PROZAC) 40 MG capsule Take 40 mg by mouth daily.       Marland Kitchen ibuprofen (ADVIL,MOTRIN) 800 MG tablet Take 800 mg by mouth every 8 (eight) hours as needed. pain      . metoprolol succinate (TOPROL-XL) 100 MG 24 hr tablet take 1 tablet by mouth once daily  90 tablet  3  . Multiple Vitamin (MULTIVITAMIN) tablet Take 1 tablet by mouth daily.      Marland Kitchen olmesartan (BENICAR) 40 MG tablet take 1 tablet by mouth once daily  90 tablet  3  . omeprazole (PRILOSEC) 40 MG capsule take 1 capsule by mouth once daily  30 capsule  11   No current facility-administered medications on file prior to visit.    BP 150/100  Pulse 73  Temp(Src) 98.1 F (36.7 C) (Oral)  Resp 20  Ht 5\' 6"  (1.676 m)  Wt 283 lb (128.368 kg)  BMI 45.70 kg/m2  SpO2 97%       Review of Systems  Constitutional: Negative.   HENT: Negative for congestion, dental problem, hearing loss, rhinorrhea, sinus pressure, sore throat and tinnitus.   Eyes: Negative for pain, discharge and visual disturbance.  Respiratory: Negative for cough and shortness of breath.   Cardiovascular: Negative for chest pain, palpitations and leg swelling.  Gastrointestinal: Negative for nausea, vomiting, abdominal pain, diarrhea, constipation, blood in stool and abdominal distention.  Genitourinary: Negative for dysuria, urgency, frequency, hematuria, flank pain, vaginal bleeding, vaginal discharge, difficulty urinating, vaginal pain and pelvic pain.  Musculoskeletal: Negative for arthralgias, gait problem and joint swelling.  Skin: Negative for rash.  Neurological: Negative for dizziness, syncope, speech difficulty, weakness, numbness and headaches.  Hematological: Negative for adenopathy.  Psychiatric/Behavioral: Negative for behavioral problems, dysphoric mood and agitation. The patient is not nervous/anxious.        Objective:   Physical Exam  Constitutional: She is oriented to person, place, and time. She appears  well-developed and well-nourished.  Blood pressure 160/90  HENT:  Head: Normocephalic.  Right Ear: External ear normal.  Left Ear: External ear normal.  Mouth/Throat: Oropharynx is clear and moist.  Eyes: Conjunctivae and EOM are normal. Pupils are equal, round, and reactive to light.  Neck: Normal range of motion. Neck supple. No thyromegaly  present.  Cardiovascular: Normal rate, regular rhythm, normal heart sounds and intact distal pulses.   Pulmonary/Chest: Effort normal and breath sounds normal.  Abdominal: Soft. Bowel sounds are normal. She exhibits no mass. There is no tenderness.  Musculoskeletal: Normal range of motion.  Lymphadenopathy:    She has no cervical adenopathy.  Neurological: She is alert and oriented to person, place, and time.  Skin: Skin is warm and dry. No rash noted.  Psychiatric: She has a normal mood and affect. Her behavior is normal.          Assessment & Plan:  Hypertension.  Compliance with her medications.  Encouraged.  Home blood pressure monitor and recommended.  Goal consists of blood pressure readings less than 140 over 90.  We'll continue present regimen at this time.  Low-salt diet, exercise, weight loss.  All encouraged Fibromyalgia.  Followup rheumatology History of colonic polyps Exogenous obesity Osteoarthritis  Medications updated. Recheck 12 months or as needed

## 2013-12-05 NOTE — Progress Notes (Signed)
   Subjective:    Patient ID: Kathy [name], female    DOB: 12-02-1950, 63 y.o.   MRN: [id number]  HPI  BP Readings from Last 3 Encounters:  12/05/13 150/100  08/20/13 168/92  01/22/13 136/88     Review of Systems     Objective:   Physical Exam        Assessment & Plan:

## 2013-12-08 ENCOUNTER — Telehealth: Payer: Self-pay | Admitting: Internal Medicine

## 2013-12-08 NOTE — Telephone Encounter (Signed)
Relevant patient education assigned to patient using Emmi. ° °

## 2014-02-12 ENCOUNTER — Other Ambulatory Visit: Payer: Self-pay | Admitting: Internal Medicine

## 2014-04-13 ENCOUNTER — Ambulatory Visit (INDEPENDENT_AMBULATORY_CARE_PROVIDER_SITE_OTHER): Payer: 59 | Admitting: Podiatry

## 2014-04-13 ENCOUNTER — Encounter: Payer: Self-pay | Admitting: Podiatry

## 2014-04-13 VITALS — BP 189/91 | HR 76 | Resp 16 | Ht 67.0 in | Wt 270.0 lb

## 2014-04-13 DIAGNOSIS — L600 Ingrowing nail: Secondary | ICD-10-CM

## 2014-04-13 NOTE — Progress Notes (Signed)
   Subjective:    Patient ID: Kathy [name], female    DOB: 03-Feb-1951, 64 y.o.   MRN: [id number]  HPI Comments: N ingrown toenails L B/L 1st medial toenail borders, left > right D 6 months O  C painful encurvated toenails A enclosed shoes T open toed shoes     Review of Systems  All other systems reviewed and are negative.      Objective:   Physical Exam  Orientated x3 white female  Vascular: DP and PT pulses 2/4 bilaterally  Neurological: Sensation to 10 g monofilament wire intact 5/5 bilaterally Vibratory sensation intact bilaterally  Dermatological: The dorsal lateral aspect left what demonstrates a scarring from surgical treatment for a melanoma in November 2014. The medial margins of the right and left hallux toenail are incurvated with low-grade edema and erythema without drainage   Musculoskeletal: No deformities noted    Assessment & Plan:   Assessment: Ingrowing medial margins of the right and left hallux toenails  Plan: Offered patient permanent removal and she verbally consents  The right and left hallux toes were blocked with 3 cc of 50-50 mixture of 2% plain Xylocaine and 0.5% plain Marcaine. The toes are prepped with Betadine and exsanguinated. The medial borders of the right and left hallux toenails were excised and a phenol matricectomy performed. An antibiotic dressing was applied. The tourniquets were released and spontaneous capillary filling time noted in the right and left hallux toes.  Postoperative oral instruction provided  Reappoint at patient's request

## 2014-04-13 NOTE — Patient Instructions (Signed)

## 2014-04-14 ENCOUNTER — Encounter: Payer: Self-pay | Admitting: Podiatry

## 2014-05-11 ENCOUNTER — Telehealth: Payer: Self-pay | Admitting: Internal Medicine

## 2014-05-11 ENCOUNTER — Encounter: Payer: Self-pay | Admitting: Family Medicine

## 2014-05-11 ENCOUNTER — Ambulatory Visit (INDEPENDENT_AMBULATORY_CARE_PROVIDER_SITE_OTHER): Payer: 59 | Admitting: Family Medicine

## 2014-05-11 VITALS — BP 132/90 | HR 77 | Temp 97.9°F | Wt 280.0 lb

## 2014-05-11 DIAGNOSIS — R062 Wheezing: Secondary | ICD-10-CM

## 2014-05-11 DIAGNOSIS — R05 Cough: Secondary | ICD-10-CM

## 2014-05-11 DIAGNOSIS — R059 Cough, unspecified: Secondary | ICD-10-CM

## 2014-05-11 MED ORDER — PREDNISONE 10 MG PO TABS: ORAL_TABLET | ORAL | Status: DC

## 2014-05-11 MED ORDER — ALBUTEROL SULFATE HFA 108 (90 BASE) MCG/ACT IN AERS: 2.0000 | INHALATION_SPRAY | RESPIRATORY_TRACT | Status: DC | PRN

## 2014-05-11 MED ORDER — CEFUROXIME AXETIL 250 MG PO TABS: 250.0000 mg | ORAL_TABLET | Freq: Two times a day (BID) | ORAL | Status: DC

## 2014-05-11 NOTE — Telephone Encounter (Signed)
Patient Information:  Caller Name: Sakai  Phone: [phone]  Patient: Quintasha, Gren  Gender: Female  DOB: May 23, 1951  Age: 63 Years  PCP: Bluford Kaufmann (Family Practice > 96yrs old)  Office Follow Up:  Does the office need to follow up with this patient?: No  Instructions For The Office: N/A  RN Note:  Patient is calling regarding cough, congestion, and wheezing at night.  No relief from symptoms x 1 week. Offered earlier appointment patient declines.   Symptoms  Reason For Call & Symptoms: nasal and chest congestion, mucus yellow.  throat irritation and cough  Reviewed Health History In EMR: Yes  Reviewed Medications In EMR: Yes  Reviewed Allergies In EMR: Yes  Reviewed Surgeries / Procedures: Yes  Date of Onset of Symptoms: 05/05/2014  Any Fever: Yes  Fever Taken: Tactile  Fever Time Of Reading: 08:06:20  Fever Last Reading: N/A  Guideline(s) Used:  Cough  Disposition Per Guideline:   Go to Office Now  Reason For Disposition Reached:   Wheezing is present  Advice Given:  Call Back If:  You become worse.  Patient Will Follow Care Advice:  YES  Appointment Scheduled:  05/11/2014 13:15:54 Appointment Scheduled Provider:  Carolann Littler [facility])

## 2014-05-11 NOTE — Progress Notes (Signed)
Subjective:    Patient ID: Kathy [name], female    DOB: 1950-12-02, 63 y.o.   MRN: [id number]  Cough Associated symptoms include chills, headaches and wheezing. Pertinent negatives include no chest pain.   Acute visit. Patient seen with one week history of cough. Cough is productive of yellow sputum. She had some wheezing off and on. She had fever up to 100 to last week but fever has resolved. She's had some intermittent headaches. Nasal congestion. Chills off and on. Occasional episodes of diarrhea. No nausea or vomiting. No history of asthma. Nonsmoker. She is aware of wheezing during the past week.  Past Medical History  Diagnosis Date  . HTN (hypertension)   . Diverticulosis   . Fibromyalgia     Dr Tobie Lords, Rheu  . IBS (irritable bowel syndrome)   . Anxiety and depression   . GERD (gastroesophageal reflux disease)   . Lumbar radiculopathy   . Syncope     idiopathic, ? related to migraines or hyperventilation. Dr Leonie Man, Neurology,  ONE TIME EPISODE AND THIS WAS YEARS AGO  . Obesity   . Anxiety   . Depression   . Heart murmur     mitral valve prolapse--no symptoms  . H/O hiatal hernia   . Headache(784.0)     PAST HX MILD MIGRAINES  . Arthritis     Osteoarthritis, Dr Maureen Ralphs  . Adenomatous colon polyp     2012  . Internal hemorrhoid    Past Surgical History  Procedure Laterality Date  . Abdominal hysterectomy      dysfunctional menses  . Appendectomy    . Cholecystectomy    . Knee arthroscopy  2008    right  . Colonoscopy  05/27/2002, 08/08/11    2003 diverticulosis, hemorrhoids 2012 same + cecal polyp  . Upper gastrointestinal endoscopy  04/21/2009, 08/08/11    2010 gastritis ;2012 small hiatal hernia. Dr Carlean Purl  . Total hip arthroplasty  02/2010    left ; Dr Maureen Ralphs  . Breast lumpectomy      right -BENIGN  . Total hip arthroplasty  03/18/2012    Procedure: TOTAL HIP ARTHROPLASTY;  Surgeon: Gearlean Alf, MD;  Location: [facility];  Service: Orthopedics;   Laterality: Right;  . Melanoma excision Left 07/2013    foot    reports that she has never smoked. She has never used smokeless tobacco. She reports that she drinks alcohol. She reports that she does not use illicit drugs. family history includes Breast cancer in her mother and paternal aunt; COPD in her maternal grandmother and mother; Diabetes in her father and paternal grandmother; Heart attack (age of onset: 63) in her brother; Heart attack (age of onset: 42) in her mother; Heart disease in her paternal grandmother; Heart failure (age of onset: 63) in her father; Hypertension in her father; Stroke in her paternal grandfather. There is no history of Colon cancer. Allergies  Allergen Reactions  . Penicillins     ? Facial angioedema  . Sulfamethoxazole-Trimethoprim     hives  . Achromycin [Tetracycline Hcl]     Dyspnea    . Nickel   . Zanaflex [Tizanidine Hydrochloride]     Does not recall reaction type       Review of Systems  Constitutional: Positive for chills and fatigue.  HENT: Positive for congestion.   Respiratory: Positive for cough and wheezing.   Cardiovascular: Negative for chest pain.  Neurological: Positive for headaches.       Objective:  Physical Exam  Constitutional: She appears well-developed and well-nourished.  HENT:  Right Ear: External ear normal.  Left Ear: External ear normal.  Mouth/Throat: Oropharynx is clear and moist.  Neck: Neck supple.  Cardiovascular: Normal rate and regular rhythm.   Pulmonary/Chest: Effort normal. No respiratory distress. She has wheezes. She has no rales.  Lymphadenopathy:    She has no cervical adenopathy.          Assessment & Plan:  Wheezing associated respiratory illness. Prednisone Taper over the next week. Reviewed possible side effects. Albuterol inhaler 2 puffs every 4 hours as needed. At this point, suspect viral and treat as above. Consider Ceftin 250 mg twice a day for 10 days if she develops any  recurrent fever or worsening symptoms.

## 2014-05-11 NOTE — Telephone Encounter (Signed)
Noted  

## 2014-05-11 NOTE — Progress Notes (Signed)
Pre visit review using our clinic review tool, if applicable. No additional management support is needed unless otherwise documented below in the visit note. 

## 2014-08-07 ENCOUNTER — Other Ambulatory Visit: Payer: Self-pay

## 2014-08-07 DIAGNOSIS — Z1231 Encounter for screening mammogram for malignant neoplasm of breast: Secondary | ICD-10-CM

## 2014-08-17 ENCOUNTER — Other Ambulatory Visit: Payer: Self-pay | Admitting: Internal Medicine

## 2014-08-18 ENCOUNTER — Ambulatory Visit
Admission: RE | Admit: 2014-08-18 | Discharge: 2014-08-18 | Disposition: A | Discharge status: Home / Self Care | Payer: 59 | Source: Ambulatory Visit

## 2014-08-18 DIAGNOSIS — Z1231 Encounter for screening mammogram for malignant neoplasm of breast: Secondary | ICD-10-CM

## 2014-09-12 ENCOUNTER — Other Ambulatory Visit: Payer: Self-pay | Admitting: Internal Medicine

## 2014-09-24 ENCOUNTER — Encounter: Payer: Self-pay | Admitting: Family Medicine

## 2014-09-24 ENCOUNTER — Ambulatory Visit (INDEPENDENT_AMBULATORY_CARE_PROVIDER_SITE_OTHER): Payer: 59 | Admitting: Family Medicine

## 2014-09-24 VITALS — BP 140/80 | HR 77 | Temp 98.4°F | Wt 283.0 lb

## 2014-09-24 DIAGNOSIS — H66002 Acute suppurative otitis media without spontaneous rupture of ear drum, left ear: Secondary | ICD-10-CM

## 2014-09-24 DIAGNOSIS — R062 Wheezing: Secondary | ICD-10-CM

## 2014-09-24 MED ORDER — CEFUROXIME AXETIL 250 MG PO TABS: 250.0000 mg | ORAL_TABLET | Freq: Two times a day (BID) | ORAL | Status: DC

## 2014-09-24 MED ORDER — PREDNISONE 10 MG PO TABS: ORAL_TABLET | ORAL | Status: DC

## 2014-09-24 NOTE — Patient Instructions (Signed)

## 2014-09-24 NOTE — Progress Notes (Signed)
Subjective:    Patient ID: Kathy [name], female    DOB: March 05, 1951, 64 y.o.   MRN: [id number]  HPI Acute visit. Onset 2 days of cough and some wheezing especially at night. She's had some nasal congestion. No fever. Similar wheezing back in August which resolved promptly with low-dose steroids. She's also had some left earache over the past couple days. No sore throat. Nonsmoker. No history of asthma.  Past Medical History  Diagnosis Date  . HTN (hypertension)   . Diverticulosis   . Fibromyalgia     Dr Tobie Lords, Rheu  . IBS (irritable bowel syndrome)   . Anxiety and depression   . GERD (gastroesophageal reflux disease)   . Lumbar radiculopathy   . Syncope     idiopathic, ? related to migraines or hyperventilation. Dr Leonie Man, Neurology,  ONE TIME EPISODE AND THIS WAS YEARS AGO  . Obesity   . Anxiety   . Depression   . Heart murmur     mitral valve prolapse--no symptoms  . H/O hiatal hernia   . Headache(784.0)     PAST HX MILD MIGRAINES  . Arthritis     Osteoarthritis, Dr Maureen Ralphs  . Adenomatous colon polyp     2012  . Internal hemorrhoid    Past Surgical History  Procedure Laterality Date  . Abdominal hysterectomy      dysfunctional menses  . Appendectomy    . Cholecystectomy    . Knee arthroscopy  2008    right  . Colonoscopy  05/27/2002, 08/08/11    2003 diverticulosis, hemorrhoids 2012 same + cecal polyp  . Upper gastrointestinal endoscopy  04/21/2009, 08/08/11    2010 gastritis ;2012 small hiatal hernia. Dr Carlean Purl  . Total hip arthroplasty  02/2010    left ; Dr Maureen Ralphs  . Breast lumpectomy      right -BENIGN  . Total hip arthroplasty  03/18/2012    Procedure: TOTAL HIP ARTHROPLASTY;  Surgeon: Gearlean Alf, MD;  Location: [facility];  Service: Orthopedics;  Laterality: Right;  . Melanoma excision Left 07/2013    foot    reports that she has never smoked. She has never used smokeless tobacco. She reports that she drinks alcohol. She reports that she does not  use illicit drugs. family history includes Breast cancer in her mother and paternal aunt; COPD in her maternal grandmother and mother; Diabetes in her father and paternal grandmother; Heart attack (age of onset: 4) in her brother; Heart attack (age of onset: 68) in her mother; Heart disease in her paternal grandmother; Heart failure (age of onset: 78) in her father; Hypertension in her father; Stroke in her paternal grandfather. There is no history of Colon cancer. Allergies  Allergen Reactions  . Penicillins     ? Facial angioedema  . Sulfamethoxazole-Trimethoprim     hives  . Achromycin [Tetracycline Hcl]     Dyspnea    . Nickel   . Zanaflex [Tizanidine Hydrochloride]     Does not recall reaction type       Review of Systems  Constitutional: Negative for chills.  HENT: Positive for congestion, ear pain and sinus pressure.   Respiratory: Positive for cough and wheezing.        Objective:   Physical Exam  Constitutional: She appears well-developed and well-nourished.  HENT:  Mouth/Throat: Oropharynx is clear and moist.  Right eardrum appears normal. Left eardrum is erythematous with distorted landmarks  Neck: Neck supple.  Cardiovascular: Normal rate and regular rhythm.  Pulmonary/Chest: Effort normal. She has wheezes. She has no rales.  Lymphadenopathy:    She has no cervical adenopathy.          Assessment & Plan:  Patient presents with acute respiratory infection. She has evidence for acute left otitis media. She is having some reactive airway component. Brief prednisone taper. Ceftin 250 mg twice a day for 10 days. Follow-up with primary if symptoms persist

## 2014-09-24 NOTE — Progress Notes (Signed)
Pre visit review using our clinic review tool, if applicable. No additional management support is needed unless otherwise documented below in the visit note. 

## 2014-09-29 ENCOUNTER — Telehealth: Payer: Self-pay | Admitting: Internal Medicine

## 2014-09-29 MED ORDER — MECLIZINE HCL 25 MG PO TABS
25.0000 mg | ORAL_TABLET | Freq: Three times a day (TID) | ORAL | Status: DC | PRN
Start: 2014-09-29 — End: 2015-04-09

## 2014-09-29 NOTE — Telephone Encounter (Signed)
Meclizine 25 #30 one 3 times a day  RF 3

## 2014-09-29 NOTE — Telephone Encounter (Signed)
Pt called to say that she is having inner ear issues and is having a lot of dizzyness, Dr Elease Hashimoto saw the pt last week and gave her antibiotic and prednisone   Pt is asking if Dr Raliegh Ip can call her in something for the dizzyness   Pharmacy ; [place]

## 2014-09-29 NOTE — Telephone Encounter (Signed)
Please see message and advise 

## 2014-09-29 NOTE — Telephone Encounter (Signed)
Spoke to pt, told her Dr. Raliegh Ip will prescribe Meclizine 25 mg one tablet TID as needed. Pt verbalized understanding. Rx sent to pharmacy.

## 2014-10-05 ENCOUNTER — Telehealth: Payer: Self-pay | Admitting: Internal Medicine

## 2014-10-05 NOTE — Telephone Encounter (Signed)
Can you schedule patient for 10/06/14

## 2014-10-05 NOTE — Telephone Encounter (Signed)
Pt has been scheduled.  °

## 2014-10-05 NOTE — Telephone Encounter (Signed)
Pt saw dr Elease Hashimoto 1/7 w/ ear infection and sinus issue.  Pt states she is not better and now her other ear is stopped up now. A small amount of blood was on q tip when she put in to see if it was blocked w/ wax Pt still coughing a lot ,upper resp a little better. It;'s the right ear that is main concern. [facility] [phone] [place]

## 2014-10-06 ENCOUNTER — Encounter: Payer: Self-pay | Admitting: Family Medicine

## 2014-10-06 ENCOUNTER — Ambulatory Visit (INDEPENDENT_AMBULATORY_CARE_PROVIDER_SITE_OTHER): Payer: 59 | Admitting: Family Medicine

## 2014-10-06 VITALS — BP 130/80 | HR 79 | Temp 98.5°F | Wt 283.0 lb

## 2014-10-06 DIAGNOSIS — H9221 Otorrhagia, right ear: Secondary | ICD-10-CM

## 2014-10-06 DIAGNOSIS — H9201 Otalgia, right ear: Secondary | ICD-10-CM

## 2014-10-06 MED ORDER — NEOMYCIN-POLYMYXIN-HC 1 % OT SOLN: 3.0000 [drp] | Freq: Four times a day (QID) | OTIC | Status: DC

## 2014-10-06 NOTE — Progress Notes (Signed)
Subjective:    Patient ID: Kathy [name], female    DOB: 1951-01-25, 64 y.o.   MRN: [id number]  HPI Patient seen as a work in. She was seen recently with left otitis media. She was treated with Ceftin. Left ear symptoms are improved and past couple days she is no sublingual bleeding from her right canal. She occasionally uses Q-tips but denies any trauma. She has soreness in the right canal. She has some persistent cough which is mostly nonproductive. No fever. No hemoptysis. No dyspnea. Recently treated with steroids and wheezing seem to improve.  Past Medical History  Diagnosis Date  . HTN (hypertension)   . Diverticulosis   . Fibromyalgia     Dr Tobie Lords, Rheu  . IBS (irritable bowel syndrome)   . Anxiety and depression   . GERD (gastroesophageal reflux disease)   . Lumbar radiculopathy   . Syncope     idiopathic, ? related to migraines or hyperventilation. Dr Leonie Man, Neurology,  ONE TIME EPISODE AND THIS WAS YEARS AGO  . Obesity   . Anxiety   . Depression   . Heart murmur     mitral valve prolapse--no symptoms  . H/O hiatal hernia   . Headache(784.0)     PAST HX MILD MIGRAINES  . Arthritis     Osteoarthritis, Dr Maureen Ralphs  . Adenomatous colon polyp     2012  . Internal hemorrhoid    Past Surgical History  Procedure Laterality Date  . Abdominal hysterectomy      dysfunctional menses  . Appendectomy    . Cholecystectomy    . Knee arthroscopy  2008    right  . Colonoscopy  05/27/2002, 08/08/11    2003 diverticulosis, hemorrhoids 2012 same + cecal polyp  . Upper gastrointestinal endoscopy  04/21/2009, 08/08/11    2010 gastritis ;2012 small hiatal hernia. Dr Carlean Purl  . Total hip arthroplasty  02/2010    left ; Dr Maureen Ralphs  . Breast lumpectomy      right -BENIGN  . Total hip arthroplasty  03/18/2012    Procedure: TOTAL HIP ARTHROPLASTY;  Surgeon: Gearlean Alf, MD;  Location: [facility];  Service: Orthopedics;  Laterality: Right;  . Melanoma excision Left 07/2013   foot    reports that she has never smoked. She has never used smokeless tobacco. She reports that she drinks alcohol. She reports that she does not use illicit drugs. family history includes Breast cancer in her mother and paternal aunt; COPD in her maternal grandmother and mother; Diabetes in her father and paternal grandmother; Heart attack (age of onset: 52) in her brother; Heart attack (age of onset: 53) in her mother; Heart disease in her paternal grandmother; Heart failure (age of onset: 37) in her father; Hypertension in her father; Stroke in her paternal grandfather. There is no history of Colon cancer. Allergies  Allergen Reactions  . Penicillins     ? Facial angioedema  . Sulfamethoxazole-Trimethoprim     hives  . Achromycin [Tetracycline Hcl]     Dyspnea    . Nickel   . Zanaflex [Tizanidine Hydrochloride]     Does not recall reaction type       Review of Systems  Constitutional: Negative for fever and chills.  HENT: Positive for ear pain.   Respiratory: Positive for cough. Negative for shortness of breath and wheezing.        Objective:   Physical Exam  Constitutional: She appears well-developed and well-nourished.  HENT:  Mouth/Throat: Oropharynx is  clear and moist.  Left eardrum now appears normal. Right eardrum is normal in appearance. On the inferior portion of her right external canal she has some clotted blood and some mild soft tissue erythema around that area. No purulence. No pustules  Neck: Neck supple.  Cardiovascular: Normal rate and regular rhythm.   Pulmonary/Chest: Effort normal and breath sounds normal. No respiratory distress. She has no wheezes. She has no rales.  Lymphadenopathy:    She has no cervical adenopathy.          Assessment & Plan:  Recent upper respiratory infection with cough. She had evidence on recent exam for acute left otitis which appears to have resolved. She now has some clotted blood along the inferior portion of the  right canal but denies any trauma. Question early otitis externa. Keep ear canal dry. Cortisporin otic solution 4 times daily. Follow-up with primary if symptoms persist

## 2014-10-19 ENCOUNTER — Telehealth: Payer: Self-pay | Admitting: Internal Medicine

## 2014-10-19 NOTE — Telephone Encounter (Signed)
If no hearing changes, fever, or ear drainage would observe for now- ie give this another 1-2 weeks.

## 2014-10-19 NOTE — Telephone Encounter (Signed)
Probably needs return office visit.  The patient apparently has no health insurance.  See if Dr. Elease Hashimoto would be willing/able  to address since he has seen her for this problem recently

## 2014-10-19 NOTE — Telephone Encounter (Signed)
[facility] - Client [place] [facility] Patient Name: Kathy [name] DOB: 22-Jan-1951 Initial Comment Caller states, ear congestion for 2 weeks, using drops, not improved Nurse Assessment Nurse: Donalynn Furlong, RN, Myna Hidalgo Date/Time Eilene Ghazi Time): 10/19/2014 12:21:05 PM Confirm and document reason for call. If symptomatic, describe symptoms. ---Caller states, ear congestion for 2 weeks, using drops, not improved "Neomycin/Polymycin" Ear solution, has also taken Antivert, Antibiotics and prednisone taper regimen for this infection. Has worked well Has the patient traveled out of the country within the last 30 days? ---No Does the patient require triage? ---Yes Related visit to physician within the last 2 weeks? ---Yes Does the PT have any chronic conditions? (i.e. diabetes, asthma, etc.) ---Yes List chronic conditions. ---high blood pressure fibromyalgia, frequent sinus congestion Guidelines Guideline Title Affirmed Question Affirmed Notes Earwax Normal earwax, questions about Final Disposition [place], RN, Myna Hidalgo

## 2014-10-19 NOTE — Telephone Encounter (Signed)
Please see message and advise 

## 2014-10-20 NOTE — Telephone Encounter (Signed)
Left message for patient to return cal.. 

## 2014-10-21 NOTE — Telephone Encounter (Signed)
Pt informed

## 2014-11-04 ENCOUNTER — Ambulatory Visit (INDEPENDENT_AMBULATORY_CARE_PROVIDER_SITE_OTHER): Payer: 59 | Admitting: Internal Medicine

## 2014-11-04 ENCOUNTER — Encounter: Payer: Self-pay | Admitting: Internal Medicine

## 2014-11-04 VITALS — BP 140/82 | HR 74 | Temp 98.1°F | Resp 20 | Ht 67.0 in | Wt 282.0 lb

## 2014-11-04 DIAGNOSIS — I10 Essential (primary) hypertension: Secondary | ICD-10-CM

## 2014-11-04 DIAGNOSIS — H9191 Unspecified hearing loss, right ear: Secondary | ICD-10-CM

## 2014-11-04 NOTE — Patient Instructions (Signed)
ENT evaluation as discussed

## 2014-11-04 NOTE — Progress Notes (Signed)
Subjective:    Patient ID: Kathy [name], female    DOB: 09-20-1950, 64 y.o.   MRN: [id number]  HPI  64 year old patient who has essential hypertension.  She was treated earlier for a left otitis and about 1 month ago, was seen and treated for a possible early right otitis externa.  She was noted to have some blood in the right canal.  For the past 3 weeks she has noted decreased auditory acuity from the right ear.  Denies any ear pain or discharge.  Past Medical History  Diagnosis Date  . HTN (hypertension)   . Diverticulosis   . Fibromyalgia     Dr Tobie Lords, Rheu  . IBS (irritable bowel syndrome)   . Anxiety and depression   . GERD (gastroesophageal reflux disease)   . Lumbar radiculopathy   . Syncope     idiopathic, ? related to migraines or hyperventilation. Dr Leonie Man, Neurology,  ONE TIME EPISODE AND THIS WAS YEARS AGO  . Obesity   . Anxiety   . Depression   . Heart murmur     mitral valve prolapse--no symptoms  . H/O hiatal hernia   . Headache(784.0)     PAST HX MILD MIGRAINES  . Arthritis     Osteoarthritis, Dr Maureen Ralphs  . Adenomatous colon polyp     2012  . Internal hemorrhoid     History   Social History  . Marital Status: Married    Spouse Name: N/A  . Number of Children: N/A  . Years of Education: N/A   Occupational History  . Not on file.   Social History Main Topics  . Smoking status: Never Smoker   . Smokeless tobacco: Never Used  . Alcohol Use: Yes     Comment: occasional  . Drug Use: No  . Sexual Activity: Not on file   Other Topics Concern  . Not on file   Social History Narrative   0 caffeine drinks     Past Surgical History  Procedure Laterality Date  . Abdominal hysterectomy      dysfunctional menses  . Appendectomy    . Cholecystectomy    . Knee arthroscopy  2008    right  . Colonoscopy  05/27/2002, 08/08/11    2003 diverticulosis, hemorrhoids 2012 same + cecal polyp  . Upper gastrointestinal endoscopy  04/21/2009,  08/08/11    2010 gastritis ;2012 small hiatal hernia. Dr Carlean Purl  . Total hip arthroplasty  02/2010    left ; Dr Maureen Ralphs  . Breast lumpectomy      right -BENIGN  . Total hip arthroplasty  03/18/2012    Procedure: TOTAL HIP ARTHROPLASTY;  Surgeon: Gearlean Alf, MD;  Location: [facility];  Service: Orthopedics;  Laterality: Right;  . Melanoma excision Left 07/2013    foot    Family History  Problem Relation Age of Onset  . Colon cancer Neg Hx   . Heart attack Mother 73  . COPD Mother   . Breast cancer Mother   . Hypertension Father   . Diabetes Father   . Heart failure Father 81  . Breast cancer Paternal Aunt     cns mets  . COPD Maternal Grandmother   . Heart disease Paternal Grandmother     died 38  . Stroke Paternal Grandfather     in late 85s  . Diabetes Paternal Grandmother   . Heart attack Brother 51    Allergies  Allergen Reactions  . Penicillins     ?  Facial angioedema  . Sulfamethoxazole-Trimethoprim     hives  . Achromycin [Tetracycline Hcl]     Dyspnea    . Nickel   . Zanaflex [Tizanidine Hydrochloride]     Does not recall reaction type     Current Outpatient Prescriptions on File Prior to Visit  Medication Sig Dispense Refill  . ALPRAZolam (XANAX) 1 MG tablet Take 1 mg by mouth 4 (four) times daily as needed. ANXIETY    . aspirin 325 MG tablet Take 325 mg by mouth daily.    Marland Kitchen BENICAR 40 MG tablet take 1 tablet by mouth once daily 90 tablet 1  . CALCIUM PO Take 1 capsule by mouth daily.    . clobetasol cream (TEMOVATE) 0.05 %     . cyclobenzaprine (FLEXERIL) 10 MG tablet Take 10 mg by mouth 4 (four) times daily as needed. SPASMS    . famotidine-calcium carbonate-magnesium hydroxide (PEPCID COMPLETE) 10-800-165 MG CHEW chewable tablet 1 -2 tablets as needed to prevent or treat heartburn    . FLUoxetine (PROZAC) 40 MG capsule Take 40 mg by mouth daily.     Marland Kitchen ibuprofen (ADVIL,MOTRIN) 800 MG tablet Take 800 mg by mouth every 8 (eight) hours as needed. pain      . meclizine (ANTIVERT) 25 MG tablet Take 1 tablet (25 mg total) by mouth 3 (three) times daily as needed for dizziness. 30 tablet 3  . metoprolol succinate (TOPROL-XL) 100 MG 24 hr tablet take 1 tablet by mouth once daily 90 tablet 1  . Multiple Vitamin (MULTIVITAMIN) tablet Take 1 tablet by mouth daily.    Marland Kitchen omeprazole (PRILOSEC) 40 MG capsule take 1 capsule by mouth once daily 30 capsule 4   No current facility-administered medications on file prior to visit.    BP 140/82 mmHg  Pulse 74  Temp(Src) 98.1 F (36.7 C) (Oral)  Resp 20  Ht 5\' 7"  (1.702 m)  Wt 282 lb (127.914 kg)  BMI 44.16 kg/m2  SpO2 98%     Review of Systems  Constitutional: Negative.   HENT: Positive for hearing loss. Negative for congestion, dental problem, ear discharge, ear pain, rhinorrhea, sinus pressure, sore throat and tinnitus.   Eyes: Negative for pain, discharge and visual disturbance.  Respiratory: Negative for cough and shortness of breath.   Cardiovascular: Negative for chest pain, palpitations and leg swelling.  Gastrointestinal: Negative for nausea, vomiting, abdominal pain, diarrhea, constipation, blood in stool and abdominal distention.  Genitourinary: Negative for dysuria, urgency, frequency, hematuria, flank pain, vaginal bleeding, vaginal discharge, difficulty urinating, vaginal pain and pelvic pain.  Musculoskeletal: Negative for joint swelling, arthralgias and gait problem.  Skin: Negative for rash.  Neurological: Negative for dizziness, syncope, speech difficulty, weakness, numbness and headaches.  Hematological: Negative for adenopathy.  Psychiatric/Behavioral: Negative for behavioral problems, dysphoric mood and agitation. The patient is not nervous/anxious.        Objective:   Physical Exam  Constitutional: She is oriented to person, place, and time. She appears well-developed and well-nourished.  HENT:  Head: Normocephalic.  Right Ear: External ear normal.  Left Ear: External ear  normal.  Mouth/Throat: Oropharynx is clear and moist.  Low hanging soft palate with pharyngeal crowding Minimal wax in the left external canal Right canal and tympanic membrane normal  Weber lateralized to the left  Eyes: Conjunctivae and EOM are normal. Pupils are equal, round, and reactive to light.  Neck: Normal range of motion. Neck supple. No thyromegaly present.  Cardiovascular: Normal rate, regular rhythm, normal heart  sounds and intact distal pulses.   Pulmonary/Chest: Effort normal and breath sounds normal.  Abdominal: Soft. Bowel sounds are normal. She exhibits no mass. There is no tenderness.  Musculoskeletal: Normal range of motion.  Lymphadenopathy:    She has no cervical adenopathy.  Neurological: She is alert and oriented to person, place, and time.  Skin: Skin is warm and dry. No rash noted.  Psychiatric: She has a normal mood and affect. Her behavior is normal.          Assessment & Plan:   Hearing loss AD.  Will set up for ENT consultation Hypertension, stable

## 2014-11-04 NOTE — Progress Notes (Signed)
Pre visit review using our clinic review tool, if applicable. No additional management support is needed unless otherwise documented below in the visit note. 

## 2014-11-24 ENCOUNTER — Other Ambulatory Visit: Payer: Self-pay | Admitting: Otolaryngology

## 2014-11-24 DIAGNOSIS — R200 Anesthesia of skin: Secondary | ICD-10-CM

## 2014-11-24 DIAGNOSIS — H905 Unspecified sensorineural hearing loss: Secondary | ICD-10-CM

## 2014-12-01 ENCOUNTER — Other Ambulatory Visit: Payer: Self-pay | Admitting: Otolaryngology

## 2014-12-01 LAB — CREATININE, SERUM: Creat: 0.85 mg/dL (ref 0.50–1.10)

## 2014-12-01 LAB — BUN: BUN: 19 mg/dL (ref 6–23)

## 2014-12-07 ENCOUNTER — Ambulatory Visit
Admission: RE | Admit: 2014-12-07 | Discharge: 2014-12-07 | Disposition: A | Discharge status: Home / Self Care | Payer: 59 | Source: Ambulatory Visit | Attending: Otolaryngology | Admitting: Otolaryngology

## 2014-12-07 DIAGNOSIS — R200 Anesthesia of skin: Secondary | ICD-10-CM

## 2014-12-07 DIAGNOSIS — H905 Unspecified sensorineural hearing loss: Secondary | ICD-10-CM

## 2014-12-07 MED ORDER — GADOBENATE DIMEGLUMINE 529 MG/ML IV SOLN
20.0000 mL | Freq: Once | INTRAVENOUS | Status: AC | PRN
  Administered 2014-12-07: 20 mL via INTRAVENOUS

## 2015-01-01 ENCOUNTER — Encounter: Payer: Self-pay | Admitting: Family Medicine

## 2015-01-01 ENCOUNTER — Ambulatory Visit (INDEPENDENT_AMBULATORY_CARE_PROVIDER_SITE_OTHER): Payer: 59 | Admitting: Family Medicine

## 2015-01-01 VITALS — BP 126/80 | HR 88 | Temp 98.4°F | Wt 292.0 lb

## 2015-01-01 DIAGNOSIS — M545 Low back pain, unspecified: Secondary | ICD-10-CM

## 2015-01-01 NOTE — Progress Notes (Signed)
   Subjective:    Patient ID: Kathy [name], female    DOB: [birth year], 64 y.o.   MRN: [id number]  HPI Acute  Patient 64 year old, Caucasian female, seen today with complaint of lower back pain radiating down legs bilaterally times one month. Patient has a past medical history of obesity, fibromyalgia, and osteoarthritis. She reports the pain as achy and cramping intermittently and with strenuous activity, prolonged standing, and prolonged sitting.  Rates severity of pain as 7/10. She takes ibuprofen 800 mg chronically and reports mild relief.  Mild relief obtained with heat.  She also took one dose of hydrocodone and reported temporary relief of pain.     Review of Systems  Constitutional: Negative.   HENT: Negative.   Eyes: Negative.   Respiratory: Negative.   Cardiovascular: Negative.   Gastrointestinal: Negative.   Endocrine: Negative.   Genitourinary: Negative.   Musculoskeletal: Positive for myalgias, back pain and gait problem.       Uses cane to assist with ambulation.   Skin: Negative.   Allergic/Immunologic: Negative.   Neurological: Negative.   Hematological: Negative.   Psychiatric/Behavioral: Negative.        Objective:   Physical Exam  Constitutional: She is oriented to person, place, and time. She appears well-developed and well-nourished.  HENT:  Head: Normocephalic and atraumatic.  Right Ear: External ear normal.  Left Ear: External ear normal.  Nose: Nose normal.  Mouth/Throat: Oropharynx is clear and moist.  Eyes: Conjunctivae and EOM are normal. Pupils are equal, round, and reactive to light.  Neck: Normal range of motion. Neck supple.  Cardiovascular: Normal rate, regular rhythm, normal heart sounds and intact distal pulses.   Pulmonary/Chest: Effort normal and breath sounds normal.  Musculoskeletal: Normal range of motion.  Neurological: She is alert and oriented to person, place, and time.  Patellar DTR unable to elicit bilaterally. Ankle  reflexes +2 bilaterally.  Skin: Skin is warm and dry.  Psychiatric: She has a normal mood and affect. Her behavior is normal. Judgment and thought content normal.          Assessment & Plan:  Assessment: Low Back Pain-Patient has experienced nonspecific low back pain intermittently for one month.  Pain not likely related to lumbar stenosis as not worse with walking. Patient has been on prednisone several times since January 2016 and prefers not to be placed on any steriodal agents presently.  Plan:  Continue to take ibuprofen as prescribed. Apply heat.  If low back pain doesn't resolve, recommended physical therapy treatment.

## 2015-01-01 NOTE — Progress Notes (Signed)
Pre visit review using our clinic review tool, if applicable. No additional management support is needed unless otherwise documented below in the visit note. 

## 2015-01-01 NOTE — Patient Instructions (Signed)
Low Back Sprain with Rehab  A sprain is an injury in which a ligament is torn. The ligaments of the lower back are vulnerable to sprains. However, they are strong and require great force to be injured. These ligaments are important for stabilizing the spinal column. Sprains are classified into three categories. Grade 1 sprains cause pain, but the tendon is not lengthened. Grade 2 sprains include a lengthened ligament, due to the ligament being stretched or partially ruptured. With grade 2 sprains there is still function, although the function may be decreased. Grade 3 sprains involve a complete tear of the tendon or muscle, and function is usually impaired. SYMPTOMS   Severe pain in the lower back.  Sometimes, a feeling of a "pop," "snap," or tear, at the time of injury.  Tenderness and sometimes swelling at the injury site.  Uncommonly, bruising (contusion) within 48 hours of injury.  Muscle spasms in the back. CAUSES  Low back sprains occur when a force is placed on the ligaments that is greater than they can handle. Common causes of injury include:  Performing a stressful act while off-balance.  Repetitive stressful activities that involve movement of the lower back.  Direct hit (trauma) to the lower back. RISK INCREASES WITH:  Contact sports (football, wrestling).  Collisions (major skiing accidents).  Sports that require throwing or lifting (baseball, weightlifting).  Sports involving twisting of the spine (gymnastics, diving, tennis, golf).  Poor strength and flexibility.  Inadequate protection.  Previous back injury or surgery (especially fusion). PREVENTION  Wear properly fitted and padded protective equipment.  Warm up and stretch properly before activity.  Allow for adequate recovery between workouts.  Maintain physical fitness:  Strength, flexibility, and endurance.  Cardiovascular fitness.  Maintain a healthy body weight. PROGNOSIS  If treated  properly, low back sprains usually heal with non-surgical treatment. The length of time for healing depends on the severity of the injury.  RELATED COMPLICATIONS   Recurring symptoms, resulting in a chronic problem.  Chronic inflammation and pain in the low back.  Delayed healing or resolution of symptoms, especially if activity is resumed too soon.  Prolonged impairment.  Unstable or arthritic joints of the low back. TREATMENT  Treatment first involves the use of ice and medicine, to reduce pain and inflammation. The use of strengthening and stretching exercises may help reduce pain with activity. These exercises may be performed at home or with a therapist. Severe injuries may require referral to a therapist for further evaluation and treatment, such as ultrasound. Your caregiver may advise that you wear a back brace or corset, to help reduce pain and discomfort. Often, prolonged bed rest results in greater harm then benefit. Corticosteroid injections may be recommended. However, these should be reserved for the most serious cases. It is important to avoid using your back when lifting objects. At night, sleep on your back on a firm mattress, with a pillow placed under your knees. If non-surgical treatment is unsuccessful, surgery may be needed.  MEDICATION   If pain medicine is needed, nonsteroidal anti-inflammatory medicines (aspirin and ibuprofen), or other minor pain relievers (acetaminophen), are often advised.  Do not take pain medicine for 7 days before surgery.  Prescription pain relievers may be given, if your caregiver thinks they are needed. Use only as directed and only as much as you need.  Ointments applied to the skin may be helpful.  Corticosteroid injections may be given by your caregiver. These injections should be reserved for the most serious cases,   because they may only be given a certain number of times. HEAT AND COLD  Cold treatment (icing) should be applied for 10  to 15 minutes every 2 to 3 hours for inflammation and pain, and immediately after activity that aggravates your symptoms. Use ice packs or an ice massage.  Heat treatment may be used before performing stretching and strengthening activities prescribed by your caregiver, physical therapist, or athletic trainer. Use a heat pack or a warm water soak. SEEK MEDICAL CARE IF:   Symptoms get worse or do not improve in 2 to 4 weeks, despite treatment.  You develop numbness or weakness in either leg.  You lose bowel or bladder function.  Any of the following occur after surgery: fever, increased pain, swelling, redness, drainage of fluids, or bleeding in the affected area.  New, unexplained symptoms develop. (Drugs used in treatment may produce side effects.) EXERCISES  RANGE OF MOTION (ROM) AND STRETCHING EXERCISES - Low Back Sprain Most people with lower back pain will find that their symptoms get worse with excessive bending forward (flexion) or arching at the lower back (extension). The exercises that will help resolve your symptoms will focus on the opposite motion.  Your physician, physical therapist or athletic trainer will help you determine which exercises will be most helpful to resolve your lower back pain. Do not complete any exercises without first consulting with your caregiver. Discontinue any exercises which make your symptoms worse, until you speak to your caregiver. If you have pain, numbness or tingling which travels down into your buttocks, leg or foot, the goal of the therapy is for these symptoms to move closer to your back and eventually resolve. Sometimes, these leg symptoms will get better, but your lower back pain may worsen. This is often an indication of progress in your rehabilitation. Be very alert to any changes in your symptoms and the activities in which you participated in the 24 hours prior to the change. Sharing this information with your caregiver will allow him or her to  most efficiently treat your condition. These exercises may help you when beginning to rehabilitate your injury. Your symptoms may resolve with or without further involvement from your physician, physical therapist or athletic trainer. While completing these exercises, remember:   Restoring tissue flexibility helps normal motion to return to the joints. This allows healthier, less painful movement and activity.  An effective stretch should be held for at least 30 seconds.  A stretch should never be painful. You should only feel a gentle lengthening or release in the stretched tissue. FLEXION RANGE OF MOTION AND STRETCHING EXERCISES: STRETCH - Flexion, Single Knee to Chest   Lie on a firm bed or floor with both legs extended in front of you.  Keeping one leg in contact with the floor, bring your opposite knee to your chest. Hold your leg in place by either grabbing behind your thigh or at your knee.  Pull until you feel a gentle stretch in your low back. Hold __________ seconds.  Slowly release your grasp and repeat the exercise with the opposite side. Repeat __________ times. Complete this exercise __________ times per day.  STRETCH - Flexion, Double Knee to Chest  Lie on a firm bed or floor with both legs extended in front of you.  Keeping one leg in contact with the floor, bring your opposite knee to your chest.  Tense your stomach muscles to support your back and then lift your other knee to your chest. Hold your legs   in place by either grabbing behind your thighs or at your knees.  Pull both knees toward your chest until you feel a gentle stretch in your low back. Hold __________ seconds.  Tense your stomach muscles and slowly return one leg at a time to the floor. Repeat __________ times. Complete this exercise __________ times per day.  STRETCH - Low Trunk Rotation  Lie on a firm bed or floor. Keeping your legs in front of you, bend your knees so they are both pointed toward the  ceiling and your feet are flat on the floor.  Extend your arms out to the side. This will stabilize your upper body by keeping your shoulders in contact with the floor.  Gently and slowly drop both knees together to one side until you feel a gentle stretch in your low back. Hold for __________ seconds.  Tense your stomach muscles to support your lower back as you bring your knees back to the starting position. Repeat the exercise to the other side. Repeat __________ times. Complete this exercise __________ times per day  EXTENSION RANGE OF MOTION AND FLEXIBILITY EXERCISES: STRETCH - Extension, Prone on Elbows   Lie on your stomach on the floor, a bed will be too soft. Place your palms about shoulder width apart and at the height of your head.  Place your elbows under your shoulders. If this is too painful, stack pillows under your chest.  Allow your body to relax so that your hips drop lower and make contact more completely with the floor.  Hold this position for __________ seconds.  Slowly return to lying flat on the floor. Repeat __________ times. Complete this exercise __________ times per day.  RANGE OF MOTION - Extension, Prone Press Ups  Lie on your stomach on the floor, a bed will be too soft. Place your palms about shoulder width apart and at the height of your head.  Keeping your back as relaxed as possible, slowly straighten your elbows while keeping your hips on the floor. You may adjust the placement of your hands to maximize your comfort. As you gain motion, your hands will come more underneath your shoulders.  Hold this position __________ seconds.  Slowly return to lying flat on the floor. Repeat __________ times. Complete this exercise __________ times per day.  RANGE OF MOTION- Quadruped, Neutral Spine   Assume a hands and knees position on a firm surface. Keep your hands under your shoulders and your knees under your hips. You may place padding under your knees for  comfort.  Drop your head and point your tailbone toward the ground below you. This will round out your lower back like an angry cat. Hold this position for __________ seconds.  Slowly lift your head and release your tail bone so that your back sags into a large arch, like an old horse.  Hold this position for __________ seconds.  Repeat this until you feel limber in your low back.  Now, find your "sweet spot." This will be the most comfortable position somewhere between the two previous positions. This is your neutral spine. Once you have found this position, tense your stomach muscles to support your low back.  Hold this position for __________ seconds. Repeat __________ times. Complete this exercise __________ times per day.  STRENGTHENING EXERCISES - Low Back Sprain These exercises may help you when beginning to rehabilitate your injury. These exercises should be done near your "sweet spot." This is the neutral, low-back arch, somewhere between fully rounded   and fully arched, that is your least painful position. When performed in this safe range of motion, these exercises can be used for people who have either a flexion or extension based injury. These exercises may resolve your symptoms with or without further involvement from your physician, physical therapist or athletic trainer. While completing these exercises, remember:   Muscles can gain both the endurance and the strength needed for everyday activities through controlled exercises.  Complete these exercises as instructed by your physician, physical therapist or athletic trainer. Increase the resistance and repetitions only as guided.  You may experience muscle soreness or fatigue, but the pain or discomfort you are trying to eliminate should never worsen during these exercises. If this pain does worsen, stop and make certain you are following the directions exactly. If the pain is still present after adjustments, discontinue the  exercise until you can discuss the trouble with your caregiver. STRENGTHENING - Deep Abdominals, Pelvic Tilt   Lie on a firm bed or floor. Keeping your legs in front of you, bend your knees so they are both pointed toward the ceiling and your feet are flat on the floor.  Tense your lower abdominal muscles to press your low back into the floor. This motion will rotate your pelvis so that your tail bone is scooping upwards rather than pointing at your feet or into the floor. With a gentle tension and even breathing, hold this position for __________ seconds. Repeat __________ times. Complete this exercise __________ times per day.  STRENGTHENING - Abdominals, Crunches   Lie on a firm bed or floor. Keeping your legs in front of you, bend your knees so they are both pointed toward the ceiling and your feet are flat on the floor. Cross your arms over your chest.  Slightly tip your chin down without bending your neck.  Tense your abdominals and slowly lift your trunk high enough to just clear your shoulder blades. Lifting higher can put excessive stress on the lower back and does not further strengthen your abdominal muscles.  Control your return to the starting position. Repeat __________ times. Complete this exercise __________ times per day.  STRENGTHENING - Quadruped, Opposite UE/LE Lift   Assume a hands and knees position on a firm surface. Keep your hands under your shoulders and your knees under your hips. You may place padding under your knees for comfort.  Find your neutral spine and gently tense your abdominal muscles so that you can maintain this position. Your shoulders and hips should form a rectangle that is parallel with the floor and is not twisted.  Keeping your trunk steady, lift your right hand no higher than your shoulder and then your left leg no higher than your hip. Make sure you are not holding your breath. Hold this position for __________ seconds.  Continuing to keep  your abdominal muscles tense and your back steady, slowly return to your starting position. Repeat with the opposite arm and leg. Repeat __________ times. Complete this exercise __________ times per day.  STRENGTHENING - Abdominals and Quadriceps, Straight Leg Raise   Lie on a firm bed or floor with both legs extended in front of you.  Keeping one leg in contact with the floor, bend the other knee so that your foot can rest flat on the floor.  Find your neutral spine, and tense your abdominal muscles to maintain your spinal position throughout the exercise.  Slowly lift your straight leg off the floor about 6 inches for a count   of 15, making sure to not hold your breath.  Still keeping your neutral spine, slowly lower your leg all the way to the floor. Repeat this exercise with each leg __________ times. Complete this exercise __________ times per day. POSTURE AND BODY MECHANICS CONSIDERATIONS - Low Back Sprain Keeping correct posture when sitting, standing or completing your activities will reduce the stress put on different body tissues, allowing injured tissues a chance to heal and limiting painful experiences. The following are general guidelines for improved posture. Your physician or physical therapist will provide you with any instructions specific to your needs. While reading these guidelines, remember:  The exercises prescribed by your provider will help you have the flexibility and strength to maintain correct postures.  The correct posture provides the best environment for your joints to work. All of your joints have less wear and tear when properly supported by a spine with good posture. This means you will experience a healthier, less painful body.  Correct posture must be practiced with all of your activities, especially prolonged sitting and standing. Correct posture is as important when doing repetitive low-stress activities (typing) as it is when doing a single heavy-load  activity (lifting). RESTING POSITIONS Consider which positions are most painful for you when choosing a resting position. If you have pain with flexion-based activities (sitting, bending, stooping, squatting), choose a position that allows you to rest in a less flexed posture. You would want to avoid curling into a fetal position on your side. If your pain worsens with extension-based activities (prolonged standing, working overhead), avoid resting in an extended position such as sleeping on your stomach. Most people will find more comfort when they rest with their spine in a more neutral position, neither too rounded nor too arched. Lying on a non-sagging bed on your side with a pillow between your knees, or on your back with a pillow under your knees will often provide some relief. Keep in mind, being in any one position for a prolonged period of time, no matter how correct your posture, can still lead to stiffness. PROPER SITTING POSTURE In order to minimize stress and discomfort on your spine, you must sit with correct posture. Sitting with good posture should be effortless for a healthy body. Returning to good posture is a gradual process. Many people can work toward this most comfortably by using various supports until they have the flexibility and strength to maintain this posture on their own. When sitting with proper posture, your ears will fall over your shoulders and your shoulders will fall over your hips. You should use the back of the chair to support your upper back. Your lower back will be in a neutral position, just slightly arched. You may place a small pillow or folded towel at the base of your lower back for  support.  When working at a desk, create an environment that supports good, upright posture. Without extra support, muscles tire, which leads to excessive strain on joints and other tissues. Keep these recommendations in mind: CHAIR:  A chair should be able to slide under your desk  when your back makes contact with the back of the chair. This allows you to work closely.  The chair's height should allow your eyes to be level with the upper part of your monitor and your hands to be slightly lower than your elbows. BODY POSITION  Your feet should make contact with the floor. If this is not possible, use a foot rest.  Keep your   ears over your shoulders. This will reduce stress on your neck and low back. INCORRECT SITTING POSTURES  If you are feeling tired and unable to assume a healthy sitting posture, do not slouch or slump. This puts excessive strain on your back tissues, causing more damage and pain. Healthier options include:  Using more support, like a lumbar pillow.  Switching tasks to something that requires you to be upright or walking.  Talking a brief walk.  Lying down to rest in a neutral-spine position. PROLONGED STANDING WHILE SLIGHTLY LEANING FORWARD  When completing a task that requires you to lean forward while standing in one place for a long time, place either foot up on a stationary 2-4 inch high object to help maintain the best posture. When both feet are on the ground, the lower back tends to lose its slight inward curve. If this curve flattens (or becomes too large), then the back and your other joints will experience too much stress, tire more quickly, and can cause pain. CORRECT STANDING POSTURES Proper standing posture should be assumed with all daily activities, even if they only take a few moments, like when brushing your teeth. As in sitting, your ears should fall over your shoulders and your shoulders should fall over your hips. You should keep a slight tension in your abdominal muscles to brace your spine. Your tailbone should point down to the ground, not behind your body, resulting in an over-extended swayback posture.  INCORRECT STANDING POSTURES  Common incorrect standing postures include a forward head, locked knees and/or an excessive  swayback. WALKING Walk with an upright posture. Your ears, shoulders and hips should all line-up. PROLONGED ACTIVITY IN A FLEXED POSITION When completing a task that requires you to bend forward at your waist or lean over a low surface, try to find a way to stabilize 3 out of 4 of your limbs. You can place a hand or elbow on your thigh or rest a knee on the surface you are reaching across. This will provide you more stability, so that your muscles do not tire as quickly. By keeping your knees relaxed, or slightly bent, you will also reduce stress across your lower back. CORRECT LIFTING TECHNIQUES DO :  Assume a wide stance. This will provide you more stability and the opportunity to get as close as possible to the object which you are lifting.  Tense your abdominals to brace your spine. Bend at the knees and hips. Keeping your back locked in a neutral-spine position, lift using your leg muscles. Lift with your legs, keeping your back straight.  Test the weight of unknown objects before attempting to lift them.  Try to keep your elbows locked down at your sides in order get the best strength from your shoulders when carrying an object.  Always ask for help when lifting heavy or awkward objects. INCORRECT LIFTING TECHNIQUES DO NOT:   Lock your knees when lifting, even if it is a small object.  Bend and twist. Pivot at your feet or move your feet when needing to change directions.  Assume that you can safely pick up even a paperclip without proper posture. Document Released: 09/04/2005 Document Revised: 11/27/2011 Document Reviewed: 12/17/2008 [employer] Patient Information 2015 [employer]. This information is not intended to replace advice given to you by your health care provider. Make sure you discuss any questions you have with your health care provider.  

## 2015-02-02 ENCOUNTER — Telehealth: Payer: Self-pay | Admitting: *Deleted

## 2015-02-02 NOTE — Telephone Encounter (Signed)
Signed medical record request received from [employer] via fax. Forwarded to [place] to scan/email to medical records. [id number]//[email or web address]

## 2015-02-11 ENCOUNTER — Other Ambulatory Visit: Payer: Self-pay | Admitting: Internal Medicine

## 2015-02-24 ENCOUNTER — Telehealth: Payer: Self-pay | Admitting: Internal Medicine

## 2015-02-24 NOTE — Telephone Encounter (Signed)
error 

## 2015-03-14 ENCOUNTER — Other Ambulatory Visit: Payer: Self-pay | Admitting: Internal Medicine

## 2015-03-31 ENCOUNTER — Other Ambulatory Visit (INDEPENDENT_AMBULATORY_CARE_PROVIDER_SITE_OTHER): Payer: 59

## 2015-03-31 DIAGNOSIS — Z0000 Encounter for general adult medical examination without abnormal findings: Secondary | ICD-10-CM | POA: Diagnosis not present

## 2015-03-31 LAB — POCT URINALYSIS DIPSTICK
Bilirubin, UA: NEGATIVE
Blood, UA: NEGATIVE
Glucose, UA: NEGATIVE
Ketones, UA: NEGATIVE
Leukocytes, UA: NEGATIVE
Nitrite, UA: NEGATIVE
Protein, UA: NEGATIVE
Spec Grav, UA: 1.02
Urobilinogen, UA: 0.2
pH, UA: 5.5

## 2015-03-31 LAB — HEPATIC FUNCTION PANEL
ALT: 31 U/L (ref 0–35)
AST: 26 U/L (ref 0–37)
Albumin: 3.8 g/dL (ref 3.5–5.2)
Alkaline Phosphatase: 63 U/L (ref 39–117)
Bilirubin, Direct: 0.1 mg/dL (ref 0.0–0.3)
Total Bilirubin: 0.4 mg/dL (ref 0.2–1.2)
Total Protein: 6.4 g/dL (ref 6.0–8.3)

## 2015-03-31 LAB — CBC WITH DIFFERENTIAL/PLATELET
Basophils Absolute: 0 10*3/uL (ref 0.0–0.1)
Basophils Relative: 0.2 % (ref 0.0–3.0)
Eosinophils Absolute: 0.2 10*3/uL (ref 0.0–0.7)
Eosinophils Relative: 4.1 % (ref 0.0–5.0)
HCT: 38.6 % (ref 36.0–46.0)
Hemoglobin: 12.9 g/dL (ref 12.0–15.0)
Lymphocytes Relative: 26.1 % (ref 12.0–46.0)
Lymphs Abs: 1.3 10*3/uL (ref 0.7–4.0)
MCHC: 33.3 g/dL (ref 30.0–36.0)
MCV: 92 fl (ref 78.0–100.0)
Monocytes Absolute: 0.3 10*3/uL (ref 0.1–1.0)
Monocytes Relative: 6.7 % (ref 3.0–12.0)
Neutro Abs: 3.1 10*3/uL (ref 1.4–7.7)
Neutrophils Relative %: 62.9 % (ref 43.0–77.0)
Platelets: 199 10*3/uL (ref 150.0–400.0)
RBC: 4.2 Mil/uL (ref 3.87–5.11)
RDW: 14.4 % (ref 11.5–15.5)
WBC: 4.9 10*3/uL (ref 4.0–10.5)

## 2015-03-31 LAB — TSH: TSH: 2.68 u[IU]/mL (ref 0.35–4.50)

## 2015-03-31 LAB — BASIC METABOLIC PANEL
BUN: 16 mg/dL (ref 6–23)
CO2: 34 mEq/L — ABNORMAL HIGH (ref 19–32)
Calcium: 9 mg/dL (ref 8.4–10.5)
Chloride: 101 mEq/L (ref 96–112)
Creatinine, Ser: 0.95 mg/dL (ref 0.40–1.20)
GFR: 63.09 mL/min (ref >60.00)
Glucose, Bld: 95 mg/dL (ref 70–99)
Potassium: 3.6 mEq/L (ref 3.5–5.1)
Sodium: 141 mEq/L (ref 135–145)

## 2015-03-31 LAB — LIPID PANEL
Cholesterol: 231 mg/dL — ABNORMAL HIGH (ref 0–200)
HDL: 53.4 mg/dL (ref >39.00)
LDL Cholesterol: 148 mg/dL — ABNORMAL HIGH (ref 0–99)
NonHDL: 177.6
Total CHOL/HDL Ratio: 4
Triglycerides: 148 mg/dL (ref 0.0–149.0)
VLDL: 29.6 mg/dL (ref 0.0–40.0)

## 2015-04-09 ENCOUNTER — Encounter: Payer: Self-pay | Admitting: Internal Medicine

## 2015-04-09 ENCOUNTER — Ambulatory Visit (INDEPENDENT_AMBULATORY_CARE_PROVIDER_SITE_OTHER): Payer: 59 | Admitting: Internal Medicine

## 2015-04-09 VITALS — BP 120/90 | HR 68 | Temp 97.9°F | Ht 66.4 in | Wt 302.0 lb

## 2015-04-09 DIAGNOSIS — M1611 Unilateral primary osteoarthritis, right hip: Secondary | ICD-10-CM

## 2015-04-09 DIAGNOSIS — Z0000 Encounter for general adult medical examination without abnormal findings: Secondary | ICD-10-CM

## 2015-04-09 DIAGNOSIS — I10 Essential (primary) hypertension: Secondary | ICD-10-CM

## 2015-04-09 DIAGNOSIS — Z860100 Personal history of colon polyps, unspecified: Secondary | ICD-10-CM

## 2015-04-09 DIAGNOSIS — E669 Obesity, unspecified: Secondary | ICD-10-CM

## 2015-04-09 MED ORDER — MECLIZINE HCL 25 MG PO TABS: 25.0000 mg | ORAL_TABLET | Freq: Three times a day (TID) | ORAL | Status: DC | PRN

## 2015-04-09 MED ORDER — CYCLOBENZAPRINE HCL 10 MG PO TABS: 10.0000 mg | ORAL_TABLET | Freq: Four times a day (QID) | ORAL | Status: DC | PRN

## 2015-04-09 MED ORDER — METOPROLOL SUCCINATE ER 100 MG PO TB24: 100.0000 mg | ORAL_TABLET | Freq: Every day | ORAL | Status: DC

## 2015-04-09 MED ORDER — OLMESARTAN MEDOXOMIL 40 MG PO TABS: 40.0000 mg | ORAL_TABLET | Freq: Every day | ORAL | Status: DC

## 2015-04-09 MED ORDER — FLUOXETINE HCL 40 MG PO CAPS: 40.0000 mg | ORAL_CAPSULE | Freq: Every day | ORAL | Status: DC

## 2015-04-09 MED ORDER — OMEPRAZOLE 40 MG PO CPDR: 40.0000 mg | DELAYED_RELEASE_CAPSULE | Freq: Every day | ORAL | Status: DC

## 2015-04-09 MED ORDER — ALPRAZOLAM 1 MG PO TABS: 1.0000 mg | ORAL_TABLET | Freq: Four times a day (QID) | ORAL | Status: DC | PRN

## 2015-04-09 NOTE — Patient Instructions (Signed)
Limit your sodium (Salt) intake    It is important that you exercise regularly, at least 20 minutes 3 to 4 times per week.  If you develop chest pain or shortness of breath seek  medical attention.  You need to lose weight.  Consider a lower calorie diet and regular exercise.  Please check your blood pressure on a regular basis.  If it is consistently greater than 150/90, please make an office appointment.  Return in 6 months for follow-up  Health Maintenance Adopting a healthy lifestyle and getting preventive care can go a long way to promote health and wellness. Talk with your health care provider about what schedule of regular examinations is right for you. This is a good chance for you to check in with your provider about disease prevention and staying healthy. In between checkups, there are plenty of things you can do on your own. Experts have done a lot of research about which lifestyle changes and preventive measures are most likely to keep you healthy. Ask your health care provider for more information. WEIGHT AND DIET  Eat a healthy diet  Be sure to include plenty of vegetables, fruits, low-fat dairy products, and lean protein.  Do not eat a lot of foods high in solid fats, added sugars, or salt.  Get regular exercise. This is one of the most important things you can do for your health.  Most adults should exercise for at least 150 minutes each week. The exercise should increase your heart rate and make you sweat (moderate-intensity exercise).  Most adults should also do strengthening exercises at least twice a week. This is in addition to the moderate-intensity exercise.  Maintain a healthy weight  Body mass index (BMI) is a measurement that can be used to identify possible weight problems. It estimates body fat based on height and weight. Your health care provider can help determine your BMI and help you achieve or maintain a healthy weight.  For females 20 years of age and  older:   A BMI below 18.5 is considered underweight.  A BMI of 18.5 to 24.9 is normal.  A BMI of 25 to 29.9 is considered overweight.  A BMI of 30 and above is considered obese.  Watch levels of cholesterol and blood lipids  You should start having your blood tested for lipids and cholesterol at 63 years of age, then have this test every 5 years.  You may need to have your cholesterol levels checked more often if:  Your lipid or cholesterol levels are high.  You are older than 63 years of age.  You are at high risk for heart disease.  CANCER SCREENING   Lung Cancer  Lung cancer screening is recommended for adults 55-80 years old who are at high risk for lung cancer because of a history of smoking.  A yearly low-dose CT scan of the lungs is recommended for people who:  Currently smoke.  Have quit within the past 15 years.  Have at least a 30-pack-year history of smoking. A pack year is smoking an average of one pack of cigarettes a day for 1 year.  Yearly screening should continue until it has been 15 years since you quit.  Yearly screening should stop if you develop a health problem that would prevent you from having lung cancer treatment.  Breast Cancer  Practice breast self-awareness. This means understanding how your breasts normally appear and feel.  It also means doing regular breast self-exams. Let your health care   provider know about any changes, no matter how small.  If you are in your 20s or 30s, you should have a clinical breast exam (CBE) by a health care provider every 1-3 years as part of a regular health exam.  If you are 67 or older, have a CBE every year. Also consider having a breast X-ray (mammogram) every year.  If you have a family history of breast cancer, talk to your health care provider about genetic screening.  If you are at high risk for breast cancer, talk to your health care provider about having an MRI and a mammogram every  year.  Breast cancer gene (BRCA) assessment is recommended for women who have family members with BRCA-related cancers. BRCA-related cancers include:  Breast.  Ovarian.  Tubal.  Peritoneal cancers.  Results of the assessment will determine the need for genetic counseling and BRCA1 and BRCA2 testing. Cervical Cancer Routine pelvic examinations to screen for cervical cancer are no longer recommended for nonpregnant women who are considered low risk for cancer of the pelvic organs (ovaries, uterus, and vagina) and who do not have symptoms. A pelvic examination may be necessary if you have symptoms including those associated with pelvic infections. Ask your health care provider if a screening pelvic exam is right for you.   The Pap test is the screening test for cervical cancer for women who are considered at risk.  If you had a hysterectomy for a problem that was not cancer or a condition that could lead to cancer, then you no longer need Pap tests.  If you are older than 65 years, and you have had normal Pap tests for the past 10 years, you no longer need to have Pap tests.  If you have had past treatment for cervical cancer or a condition that could lead to cancer, you need Pap tests and screening for cancer for at least 20 years after your treatment.  If you no longer get a Pap test, assess your risk factors if they change (such as having a new sexual partner). This can affect whether you should start being screened again.  Some women have medical problems that increase their chance of getting cervical cancer. If this is the case for you, your health care provider may recommend more frequent screening and Pap tests.  The human papillomavirus (HPV) test is another test that may be used for cervical cancer screening. The HPV test looks for the virus that can cause cell changes in the cervix. The cells collected during the Pap test can be tested for HPV.  The HPV test can be used to screen  women 4 years of age and older. Getting tested for HPV can extend the interval between normal Pap tests from three to five years.  An HPV test also should be used to screen women of any age who have unclear Pap test results.  After 63 years of age, women should have HPV testing as often as Pap tests.  Colorectal Cancer  This type of cancer can be detected and often prevented.  Routine colorectal cancer screening usually begins at 63 years of age and continues through 63 years of age.  Your health care provider may recommend screening at an earlier age if you have risk factors for colon cancer.  Your health care provider may also recommend using home test kits to check for hidden blood in the stool.  A small camera at the end of a tube can be used to examine your colon  directly (sigmoidoscopy or colonoscopy). This is done to check for the earliest forms of colorectal cancer.  Routine screening usually begins at age 2.  Direct examination of the colon should be repeated every 5-10 years through 63 years of age. However, you may need to be screened more often if early forms of precancerous polyps or small growths are found. Skin Cancer  Check your skin from head to toe regularly.  Tell your health care provider about any new moles or changes in moles, especially if there is a change in a mole's shape or color.  Also tell your health care provider if you have a mole that is larger than the size of a pencil eraser.  Always use sunscreen. Apply sunscreen liberally and repeatedly throughout the day.  Protect yourself by wearing long sleeves, pants, a wide-brimmed hat, and sunglasses whenever you are outside. HEART DISEASE, DIABETES, AND HIGH BLOOD PRESSURE   Have your blood pressure checked at least every 1-2 years. High blood pressure causes heart disease and increases the risk of stroke.  If you are between 17 years and 65 years old, ask your health care provider if you should take  aspirin to prevent strokes.  Have regular diabetes screenings. This involves taking a blood sample to check your fasting blood sugar level.  If you are at a normal weight and have a low risk for diabetes, have this test once every three years after 63 years of age.  If you are overweight and have a high risk for diabetes, consider being tested at a younger age or more often. PREVENTING INFECTION  Hepatitis B  If you have a higher risk for hepatitis B, you should be screened for this virus. You are considered at high risk for hepatitis B if:  You were born in a country where hepatitis B is common. Ask your health care provider which countries are considered high risk.  Your parents were born in a high-risk country, and you have not been immunized against hepatitis B (hepatitis B vaccine).  You have HIV or AIDS.  You use needles to inject street drugs.  You live with someone who has hepatitis B.  You have had sex with someone who has hepatitis B.  You get hemodialysis treatment.  You take certain medicines for conditions, including cancer, organ transplantation, and autoimmune conditions. Hepatitis C  Blood testing is recommended for:  Everyone born from [birth year] through 1964.  Anyone with known risk factors for hepatitis C. Sexually transmitted infections (STIs)  You should be screened for sexually transmitted infections (STIs) including gonorrhea and chlamydia if:  You are sexually active and are younger than 64 years of age.  You are older than 63 years of age and your health care provider tells you that you are at risk for this type of infection.  Your sexual activity has changed since you were last screened and you are at an increased risk for chlamydia or gonorrhea. Ask your health care provider if you are at risk.  If you do not have HIV, but are at risk, it may be recommended that you take a prescription medicine daily to prevent HIV infection. This is called  pre-exposure prophylaxis (PrEP). You are considered at risk if:  You are sexually active and do not regularly use condoms or know the HIV status of your partner(s).  You take drugs by injection.  You are sexually active with a partner who has HIV. Talk with your health care provider about whether you are  at high risk of being infected with HIV. If you choose to begin PrEP, you should first be tested for HIV. You should then be tested every 3 months for as long as you are taking PrEP.  PREGNANCY   If you are premenopausal and you may become pregnant, ask your health care provider about preconception counseling.  If you may become pregnant, take 400 to 800 micrograms (mcg) of folic acid every day.  If you want to prevent pregnancy, talk to your health care provider about birth control (contraception). OSTEOPOROSIS AND MENOPAUSE   Osteoporosis is a disease in which the bones lose minerals and strength with aging. This can result in serious bone fractures. Your risk for osteoporosis can be identified using a bone density scan.  If you are 65 years of age or older, or if you are at risk for osteoporosis and fractures, ask your health care provider if you should be screened.  Ask your health care provider whether you should take a calcium or vitamin D supplement to lower your risk for osteoporosis.  Menopause may have certain physical symptoms and risks.  Hormone replacement therapy may reduce some of these symptoms and risks. Talk to your health care provider about whether hormone replacement therapy is right for you.  HOME CARE INSTRUCTIONS   Schedule regular health, dental, and eye exams.  Stay current with your immunizations.   Do not use any tobacco products including cigarettes, chewing tobacco, or electronic cigarettes.  If you are pregnant, do not drink alcohol.  If you are breastfeeding, limit how much and how often you drink alcohol.  Limit alcohol intake to no more than 1  drink per day for nonpregnant women. One drink equals 12 ounces of beer, 5 ounces of wine, or 1 ounces of hard liquor.  Do not use street drugs.  Do not share needles.  Ask your health care provider for help if you need support or information about quitting drugs.  Tell your health care provider if you often feel depressed.  Tell your health care provider if you have ever been abused or do not feel safe at home. Document Released: 03/20/2011 Document Revised: 01/19/2014 Document Reviewed: 08/06/2013 [employer] Patient Information 2015 [employer]. This information is not intended to replace advice given to you by your health care provider. Make sure you discuss any questions you have with your health care provider.  

## 2015-04-09 NOTE — Progress Notes (Signed)
Subjective:    Patient ID: Kathy [name], female    DOB: December 24, 1950, 64 y.o.   MRN: [id number]  HPI    Subjective:    Patient ID: Kathy [name], female    DOB: 1951/07/23, 64 y.o.   MRN: [id number]  HPI  BP Readings from Last 3 Encounters:  04/09/15 120/90  01/01/15 126/80  11/04/14 140/82   Wt Readings from Last 3 Encounters:  04/09/15 302 lb (136.986 kg)  01/01/15 292 lb (132.45 kg)  11/04/14 282 lb (127.914 kg)      Subjective:    Patient ID: Kathy [name], female    DOB: [birth year], 64 y.o.   MRN: [id number]  HPI 64 year old patient who is seen today for a preventive health examination   Her father is a patient at this practice location.  She is followed closely by GI due to colonic polyps and GERD.  She is followed by rheumatology due to fibromyalgia.  She also has a history of osteoarthritis.  She has treated hypertension, which has been controlled on dual therapy.  Additionally, she has a history of obesity.  In general, doing quite well today. 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  She is followed annually by dermatology as well as gynecology.  Past Medical History  Diagnosis Date  . HTN (hypertension)   . Diverticulosis   . Fibromyalgia     Dr Tobie Lords, Rheu  . IBS (irritable bowel syndrome)   . Anxiety and depression   . GERD (gastroesophageal reflux disease)   . Lumbar radiculopathy   . Syncope     idiopathic, ? related to migraines or hyperventilation. Dr Leonie Man, Neurology,  ONE TIME EPISODE AND THIS WAS YEARS AGO  . Obesity   . Anxiety   . Depression   . Heart murmur     mitral valve prolapse--no symptoms  . H/O hiatal hernia   . Headache(784.0)     PAST HX MILD MIGRAINES  . Arthritis     Osteoarthritis, Dr Maureen Ralphs  . Adenomatous colon polyp     2012  . Internal hemorrhoid     History   Social History  . Marital Status: Married    Spouse Name: N/A  . Number of Children: N/A  .  Years of Education: N/A   Occupational History  . Not on file.   Social History Main Topics  . Smoking status: Never Smoker   . Smokeless tobacco: Never Used  . Alcohol Use: Yes     Comment: occasional  . Drug Use: No  . Sexual Activity: Not on file   Other Topics Concern  . Not on file   Social History Narrative   0 caffeine drinks     Past Surgical History  Procedure Laterality Date  . Abdominal hysterectomy      dysfunctional menses  . Appendectomy    . Cholecystectomy    . Knee arthroscopy  2008    right  . Colonoscopy  05/27/2002, 08/08/11    2003 diverticulosis, hemorrhoids 2012 same + cecal polyp  . Upper gastrointestinal endoscopy  04/21/2009, 08/08/11    2010 gastritis ;2012 small hiatal hernia. Dr Carlean Purl  . Total hip arthroplasty  02/2010    left ; Dr Maureen Ralphs  . Breast lumpectomy      right -BENIGN  . Total hip arthroplasty  03/18/2012    Procedure: TOTAL HIP ARTHROPLASTY;  Surgeon: Gearlean Alf, MD;  Location: [facility];  Service: Orthopedics;  Laterality: Right;  . Melanoma excision Left 07/2013    foot    Family History  Problem Relation Age of Onset  . Colon cancer Neg Hx   . Heart attack Mother 21  . COPD Mother   . Breast cancer Mother   . Hypertension Father   . Diabetes Father   . Heart failure Father 29  . Breast cancer Paternal Aunt     cns mets  . COPD Maternal Grandmother   . Heart disease Paternal Grandmother     died 10  . Stroke Paternal Grandfather     in late 49s  . Diabetes Paternal Grandmother   . Heart attack Brother 83    Allergies  Allergen Reactions  . Penicillins     ? Facial angioedema  . Sulfamethoxazole-Trimethoprim     hives  . Achromycin [Tetracycline Hcl]     Dyspnea    . Nickel   . Zanaflex [Tizanidine Hydrochloride]     Does not recall reaction type     Current Outpatient Prescriptions on File Prior to Visit  Medication Sig Dispense Refill  . ALPRAZolam (XANAX) 1 MG tablet Take 1 mg by mouth 4 (four)  times daily as needed. ANXIETY    . aspirin 325 MG tablet Take 325 mg by mouth daily.    Marland Kitchen BENICAR 40 MG tablet take 1 tablet by mouth once daily 30 tablet 2  . CALCIUM PO Take 1 capsule by mouth daily.    . clobetasol cream (TEMOVATE) 0.05 %     . cyclobenzaprine (FLEXERIL) 10 MG tablet Take 10 mg by mouth 4 (four) times daily as needed. SPASMS    . famotidine-calcium carbonate-magnesium hydroxide (PEPCID COMPLETE) 10-800-165 MG CHEW chewable tablet 1 -2 tablets as needed to prevent or treat heartburn    . FLUoxetine (PROZAC) 40 MG capsule Take 40 mg by mouth daily.     Marland Kitchen ibuprofen (ADVIL,MOTRIN) 800 MG tablet Take 800 mg by mouth every 8 (eight) hours as needed. pain    . meclizine (ANTIVERT) 25 MG tablet Take 1 tablet (25 mg total) by mouth 3 (three) times daily as needed for dizziness. 30 tablet 3  . metoprolol succinate (TOPROL-XL) 100 MG 24 hr tablet take 1 tablet by mouth once daily 30 tablet 2  . Multiple Vitamin (MULTIVITAMIN) tablet Take 1 tablet by mouth daily.    Marland Kitchen omeprazole (PRILOSEC) 40 MG capsule take 1 capsule by mouth once daily 30 capsule 0   No current facility-administered medications on file prior to visit.    BP 120/90 mmHg  Pulse 68  Temp(Src) 97.9 F (36.6 C) (Oral)  Ht 5' 6.4" (1.687 m)  Wt 302 lb (136.986 kg)  BMI 48.13 kg/m2       Review of Systems  Constitutional: Negative.   HENT: Negative for congestion, dental problem, hearing loss, rhinorrhea, sinus pressure, sore throat and tinnitus.   Eyes: Negative for pain, discharge and visual disturbance.  Respiratory: Negative for cough and shortness of breath.   Cardiovascular: Negative for chest pain, palpitations and leg swelling.  Gastrointestinal: Negative for nausea, vomiting, abdominal pain, diarrhea, constipation, blood in stool and abdominal distention.  Genitourinary: Negative for dysuria, urgency, frequency, hematuria, flank pain, vaginal bleeding, vaginal discharge, difficulty urinating, vaginal  pain and pelvic pain.  Musculoskeletal: Negative for arthralgias, gait problem and joint swelling.  Skin: Negative for rash.  Neurological: Negative for dizziness, syncope, speech difficulty, weakness, numbness and headaches.  Hematological: Negative for adenopathy.  Psychiatric/Behavioral: Negative  for behavioral problems, dysphoric mood and agitation. The patient is not nervous/anxious.        Objective:   Physical Exam  Constitutional: She is oriented to person, place, and time. She appears well-developed and well-nourished.  Blood pressure 140/80  HENT:  Head: Normocephalic.  Right Ear: External ear normal.  Left Ear: External ear normal.  Mouth/Throat: Oropharynx is clear and moist.  Eyes: Conjunctivae and EOM are normal. Pupils are equal, round, and reactive to light.  Neck: Normal range of motion. Neck supple. No thyromegaly present.  Cardiovascular: Normal rate, regular rhythm, normal heart sounds and intact distal pulses.   Pulmonary/Chest: Effort normal and breath sounds normal.  Abdominal: Soft. Bowel sounds are normal. She exhibits no mass. There is no tenderness.  Musculoskeletal: Normal range of motion.  Lymphadenopathy:    She has no cervical adenopathy.  Neurological: She is alert and oriented to person, place, and time.  Skin: Skin is warm and dry. No rash noted.  Psychiatric: She has a normal mood and affect. Her behavior is normal.          Assessment & Plan:  Hypertension.   Low-salt diet, exercise, weight loss.  All encouraged Fibromyalgia.  Followup rheumatology History of colonic polyps-  needs follow-up colonoscopy November 2017  Exogenous obesity Osteoarthritis  Medications updated. 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  Patient will consider home sleep study Essential hypertension.  Low-salt diet, weight loss exercise, home blood pressure monitoring.  All encouraged History colonic polyps.  Follow-up colonoscopy November 2017  General recheck 6 months

## 2015-04-09 NOTE — Progress Notes (Signed)
Pre visit review using our clinic review tool, if applicable. No additional management support is needed unless otherwise documented below in the visit note. 

## 2015-06-30 ENCOUNTER — Other Ambulatory Visit: Payer: Self-pay | Admitting: Internal Medicine

## 2015-07-05 ENCOUNTER — Other Ambulatory Visit: Payer: Self-pay

## 2015-07-26 ENCOUNTER — Other Ambulatory Visit: Payer: Self-pay

## 2015-07-26 DIAGNOSIS — Z1231 Encounter for screening mammogram for malignant neoplasm of breast: Secondary | ICD-10-CM

## 2015-07-30 ENCOUNTER — Telehealth: Payer: Self-pay | Admitting: Internal Medicine

## 2015-07-30 NOTE — Telephone Encounter (Signed)
Patient reports that she is having some GERD symptoms.  She is advised to resume her omeprazole q am and can add Pepcid at HS as indicated in the last office note from 2014.  She also reports occasional rectal bleeding she assumes is from her hemorrhoids.  She is advised to keep stools soft.  She will be added to the office schedule for 09/24/15.  I have added her to the cancellation list and encouraged her to periodically call for cancellations.

## 2015-08-16 ENCOUNTER — Telehealth: Payer: Self-pay | Admitting: *Deleted

## 2015-08-16 NOTE — Telephone Encounter (Signed)
Information given to schedulers to schedule appointment ---------------------------------------------------------------------------------------------------------------------------------------------------------------------------------------------------- PLEASE NOTE: All timestamps contained within this report are represented as [place] Standard Time. CONFIDENTIALTY NOTICE: This fax transmission is intended only for the addressee. It contains information that is legally privileged, confidential or otherwise protected from use or disclosure. If you are not the intended recipient, you are strictly prohibited from reviewing, disclosing, copying using or disseminating any of this information or taking any action in reliance on or regarding this information. If you have received this fax in error, please notify us immediately by telephone so that we can arrange for its return to [name]. Phone: [phone], Toll-Free: [phone], Fax: [phone] Page: 1 of 2 Call Id: [id number] [facility] - Client [place] Patient Name: Kathy [name] Gender: Female DOB: 1951-01-24 Age: 64 Y 68 M 2 D Return Phone Number: [phone] (Primary), [phone] (Secondary) Address: City/State/Zip: [place] Client [facility] - Client Client Site [facility] - Night Physician Simonne Martinet Contact Type Call Call Type Triage / Clinical Relationship To Patient Self Return Phone Number [phone] (Primary) Chief Complaint Vomiting Initial Comment Caller states they have been having diarrhea and vomiting. They have been taking medication but it doesn't seem to be getting better. PreDisposition Go to Urgent Care/Walk-In Clinic Nurse Assessment Nurse: Radford Pax, RN, Sharyn Lull Date/Time Eilene Ghazi Time): 08/13/2015 3:16:06 PM Confirm and document reason for call. If symptomatic, describe  symptoms. ---Caller states she have been having diarrhea and nausea for 3 days. No vomiting She has not taken taking medication but it doesn't seem to be getting better. 5-6 times BM's today. Fever last night but not today. Has the patient traveled out of the country within the last 30 days? ---No Does the patient have any new or worsening symptoms? ---Yes Will a triage be completed? ---Yes Related visit to physician within the last 2 weeks? ---No Does the PT have any chronic conditions? (i.e. diabetes, asthma, etc.) ---No Is this a behavioral health call? ---No Guidelines Guideline Title Affirmed Question Affirmed Notes Nurse Date/Time (Eastern Time) Diarrhea [1] SEVERE diarrhea (e.g., 7 or more times / day more than normal) AND [2] age > 66 years Holztrager, RN, Sharyn Lull 08/13/2015 3:19:05 PM Disp. Time Eilene Ghazi Time) Disposition Final User 08/13/2015 3:22:29 PM See Physician within 4 Hours (or PCP triage) Yes Holztrager, RN, Julio Sicks Understands: Yes PLEASE NOTE: All timestamps contained within this report are represented as [place] Standard Time. CONFIDENTIALTY NOTICE: This fax transmission is intended only for the addressee. It contains information that is legally privileged, confidential or otherwise protected from use or disclosure. If you are not the intended recipient, you are strictly prohibited from reviewing, disclosing, copying using or disseminating any of this information or taking any action in reliance on or regarding this information. If you have received this fax in error, please notify us immediately by telephone so that we can arrange for its return to [name]. Phone: [phone], Toll-Free: [phone], Fax: [phone] Page: 2 of 2 Call Id: [id number] Disagree/Comply: Comply Care Advice Given Per Guideline SEE PHYSICIAN WITHIN 4 HOURS (or PCP triage): * IF OFFICE WILL BE OPEN: You need to be seen within the next 3 or 4 hours. Call your doctor's office  now or as soon as it opens. CLEAR FLUIDS: * Drink more fluids. * Sip water or a half-strength sports drink (Gatorade or Powerade; mix half and half with water) CALL BACK IF: * You become worse. CARE ADVICE given per Diarrhea (Adult)  guideline. After Care Instructions Given Call Event Type User Date / Time Description Referrals GO TO FACILITY UNDECIDED

## 2015-08-16 NOTE — Telephone Encounter (Signed)
error 

## 2015-08-16 NOTE — Telephone Encounter (Signed)
[name] left message for patient to call office back to schedule appointment

## 2015-08-17 ENCOUNTER — Encounter: Payer: Self-pay | Admitting: Internal Medicine

## 2015-08-17 ENCOUNTER — Ambulatory Visit (INDEPENDENT_AMBULATORY_CARE_PROVIDER_SITE_OTHER): Payer: 59 | Admitting: Internal Medicine

## 2015-08-17 VITALS — BP 126/80 | HR 73 | Temp 98.8°F | Resp 20 | Ht 66.5 in | Wt 296.0 lb

## 2015-08-17 DIAGNOSIS — I10 Essential (primary) hypertension: Secondary | ICD-10-CM

## 2015-08-17 DIAGNOSIS — K529 Noninfective gastroenteritis and colitis, unspecified: Secondary | ICD-10-CM

## 2015-08-17 NOTE — Progress Notes (Signed)
Subjective:    Patient ID: Kathy [name], female    DOB: 05/06/1951, 64 y.o.   MRN: [id number]  HPI  64 year old patient who has essential hypertension and exogenous obesity.  She is seen today for an acute visit due to diarrhea of 5 days duration.  Symptoms have improved over the past 24 hours and she more recently has had a semi-formed stool.  No nausea or vomiting.  She has had some abdominal tenderness, which also is improving.  No fever She does have a history colonic polyps, IBS as well as some diverticular disease  Past Medical History  Diagnosis Date  . HTN (hypertension)   . Diverticulosis   . Fibromyalgia     Dr Tobie Lords, Rheu  . IBS (irritable bowel syndrome)   . Anxiety and depression   . GERD (gastroesophageal reflux disease)   . Lumbar radiculopathy   . Syncope     idiopathic, ? related to migraines or hyperventilation. Dr Leonie Man, Neurology,  ONE TIME EPISODE AND THIS WAS YEARS AGO  . Obesity   . Anxiety   . Depression   . Heart murmur     mitral valve prolapse--no symptoms  . H/O hiatal hernia   . Headache(784.0)     PAST HX MILD MIGRAINES  . Arthritis     Osteoarthritis, Dr Maureen Ralphs  . Adenomatous colon polyp     2012  . Internal hemorrhoid     Social History   Social History  . Marital Status: Married    Spouse Name: N/A  . Number of Children: N/A  . Years of Education: N/A   Occupational History  . Not on file.   Social History Main Topics  . Smoking status: Never Smoker   . Smokeless tobacco: Never Used  . Alcohol Use: Yes     Comment: occasional  . Drug Use: No  . Sexual Activity: Not on file   Other Topics Concern  . Not on file   Social History Narrative   0 caffeine drinks     Past Surgical History  Procedure Laterality Date  . Abdominal hysterectomy      dysfunctional menses  . Appendectomy    . Cholecystectomy    . Knee arthroscopy  2008    right  . Colonoscopy  05/27/2002, 08/08/11    2003 diverticulosis,  hemorrhoids 2012 same + cecal polyp  . Upper gastrointestinal endoscopy  04/21/2009, 08/08/11    2010 gastritis ;2012 small hiatal hernia. Dr Carlean Purl  . Total hip arthroplasty  02/2010    left ; Dr Maureen Ralphs  . Breast lumpectomy      right -BENIGN  . Total hip arthroplasty  03/18/2012    Procedure: TOTAL HIP ARTHROPLASTY;  Surgeon: Gearlean Alf, MD;  Location: [facility];  Service: Orthopedics;  Laterality: Right;  . Melanoma excision Left 07/2013    foot    Family History  Problem Relation Age of Onset  . Colon cancer Neg Hx   . Heart attack Mother 25  . COPD Mother   . Breast cancer Mother   . Hypertension Father   . Diabetes Father   . Heart failure Father 43  . Breast cancer Paternal Aunt     cns mets  . COPD Maternal Grandmother   . Heart disease Paternal Grandmother     died 23  . Stroke Paternal Grandfather     in late 81s  . Diabetes Paternal Grandmother   . Heart attack Brother 21  Allergies  Allergen Reactions  . Penicillins     ? Facial angioedema  . Sulfamethoxazole-Trimethoprim     hives  . Achromycin [Tetracycline Hcl]     Dyspnea    . Nickel   . Zanaflex [Tizanidine Hydrochloride]     Does not recall reaction type     Current Outpatient Prescriptions on File Prior to Visit  Medication Sig Dispense Refill  . ALPRAZolam (XANAX) 1 MG tablet take 1 tablet four times a day by mouth 120 tablet 1  . aspirin 325 MG tablet Take 325 mg by mouth daily.    Marland Kitchen CALCIUM PO Take 1 capsule by mouth daily.    . clobetasol cream (TEMOVATE) 0.05 %     . cyclobenzaprine (FLEXERIL) 10 MG tablet Take 1 tablet (10 mg total) by mouth 4 (four) times daily as needed. SPASMS 30 tablet 5  . famotidine-calcium carbonate-magnesium hydroxide (PEPCID COMPLETE) 10-800-165 MG CHEW chewable tablet 1 -2 tablets as needed to prevent or treat heartburn    . FLUoxetine (PROZAC) 40 MG capsule Take 1 capsule (40 mg total) by mouth daily. 90 capsule 3  . ibuprofen (ADVIL,MOTRIN) 800 MG tablet  Take 800 mg by mouth every 8 (eight) hours as needed. pain    . meclizine (ANTIVERT) 25 MG tablet Take 1 tablet (25 mg total) by mouth 3 (three) times daily as needed for dizziness. 30 tablet 3  . metoprolol succinate (TOPROL-XL) 100 MG 24 hr tablet Take 1 tablet (100 mg total) by mouth daily. Take with or immediately following a meal. 90 tablet 3  . Multiple Vitamin (MULTIVITAMIN) tablet Take 1 tablet by mouth daily.    Marland Kitchen olmesartan (BENICAR) 40 MG tablet Take 1 tablet (40 mg total) by mouth daily. 90 tablet 3  . omeprazole (PRILOSEC) 40 MG capsule Take 1 capsule (40 mg total) by mouth daily. 90 capsule 3   No current facility-administered medications on file prior to visit.    BP 126/80 mmHg  Pulse 73  Temp(Src) 98.8 F (37.1 C) (Oral)  Resp 20  Ht 5' 6.5" (1.689 m)  Wt 296 lb (134.265 kg)  BMI 47.07 kg/m2  SpO2 97%     Review of Systems  Constitutional: Negative.   HENT: Negative for congestion, dental problem, hearing loss, rhinorrhea, sinus pressure, sore throat and tinnitus.   Eyes: Negative for pain, discharge and visual disturbance.  Respiratory: Negative for cough and shortness of breath.   Cardiovascular: Negative for chest pain, palpitations and leg swelling.  Gastrointestinal: Positive for abdominal pain and diarrhea. Negative for nausea, vomiting, constipation, blood in stool, abdominal distention and rectal pain.  Genitourinary: Negative for dysuria, urgency, frequency, hematuria, flank pain, vaginal bleeding, vaginal discharge, difficulty urinating, vaginal pain and pelvic pain.  Musculoskeletal: Negative for joint swelling, arthralgias and gait problem.  Skin: Negative for rash.  Neurological: Negative for dizziness, syncope, speech difficulty, weakness, numbness and headaches.  Hematological: Negative for adenopathy.  Psychiatric/Behavioral: Negative for behavioral problems, dysphoric mood and agitation. The patient is not nervous/anxious.        Objective:    Physical Exam  Constitutional: She is oriented to person, place, and time. She appears well-developed and well-nourished.  HENT:  Head: Normocephalic.  Right Ear: External ear normal.  Left Ear: External ear normal.  Mouth/Throat: Oropharynx is clear and moist.  Eyes: Conjunctivae and EOM are normal. Pupils are equal, round, and reactive to light.  Neck: Normal range of motion. Neck supple. No thyromegaly present.  Cardiovascular: Normal rate, regular rhythm,  normal heart sounds and intact distal pulses.   Pulmonary/Chest: Effort normal and breath sounds normal.  Abdominal: Soft. Bowel sounds are normal. She exhibits no mass. There is tenderness.  Very  Mild diffuse tenderness No guarding or rebound  Musculoskeletal: Normal range of motion.  Lymphadenopathy:    She has no cervical adenopathy.  Neurological: She is alert and oriented to person, place, and time.  Skin: Skin is warm and dry. No rash noted.  Psychiatric: She has a normal mood and affect. Her behavior is normal.          Assessment & Plan:   Resolving viral gastroenteritis.  Will slowly advance her diet Essential hypertension, stable Obesity.  Patient has joined a [employer] program  CPX next year as scheduled

## 2015-08-17 NOTE — Telephone Encounter (Signed)
Appointment with Dr. Raliegh Ip 11/29

## 2015-08-17 NOTE — Patient Instructions (Signed)
Slowly advance diet as tolerated  Report any new or worsening symptoms  CPX as scheduled next year  Viral Gastroenteritis Viral gastroenteritis is also known as stomach flu. This condition affects the stomach and intestinal tract. It can cause sudden diarrhea and vomiting. The illness typically lasts 3 to 8 days. Most people develop an immune response that eventually gets rid of the virus. While this natural response develops, the virus can make you quite ill. CAUSES  Many different viruses can cause gastroenteritis, such as rotavirus or noroviruses. You can catch one of these viruses by consuming contaminated food or water. You may also catch a virus by sharing utensils or other personal items with an infected person or by touching a contaminated surface. SYMPTOMS  The most common symptoms are diarrhea and vomiting. These problems can cause a severe loss of body fluids (dehydration) and a body salt (electrolyte) imbalance. Other symptoms may include:  Fever.  Headache.  Fatigue.  Abdominal pain. DIAGNOSIS  Your caregiver can usually diagnose viral gastroenteritis based on your symptoms and a physical exam. A stool sample may also be taken to test for the presence of viruses or other infections. TREATMENT  This illness typically goes away on its own. Treatments are aimed at rehydration. The most serious cases of viral gastroenteritis involve vomiting so severely that you are not able to keep fluids down. In these cases, fluids must be given through an intravenous line (IV). HOME CARE INSTRUCTIONS   Drink enough fluids to keep your urine clear or pale yellow. Drink small amounts of fluids frequently and increase the amounts as tolerated.  Ask your caregiver for specific rehydration instructions.  Avoid:  Foods high in sugar.  Alcohol.  Carbonated drinks.  Tobacco.  Juice.  Caffeine drinks.  Extremely hot or cold fluids.  Fatty, greasy foods.  Too much intake of anything  at one time.  Dairy products until 24 to 48 hours after diarrhea stops.  You may consume probiotics. Probiotics are active cultures of beneficial bacteria. They may lessen the amount and number of diarrheal stools in adults. Probiotics can be found in yogurt with active cultures and in supplements.  Wash your hands well to avoid spreading the virus.  Only take over-the-counter or prescription medicines for pain, discomfort, or fever as directed by your caregiver. Do not give aspirin to children. Antidiarrheal medicines are not recommended.  Ask your caregiver if you should continue to take your regular prescribed and over-the-counter medicines.  Keep all follow-up appointments as directed by your caregiver. SEEK IMMEDIATE MEDICAL CARE IF:   You are unable to keep fluids down.  You do not urinate at least once every 6 to 8 hours.  You develop shortness of breath.  You notice blood in your stool or vomit. This may look like coffee grounds.  You have abdominal pain that increases or is concentrated in one small area (localized).  You have persistent vomiting or diarrhea.  You have a fever.  The patient is a child younger than 3 months, and he or she has a fever.  The patient is a child older than 3 months, and he or she has a fever and persistent symptoms.  The patient is a child older than 3 months, and he or she has a fever and symptoms suddenly get worse.  The patient is a baby, and he or she has no tears when crying. MAKE SURE YOU:   Understand these instructions.  Will watch your condition.  Will get help right  away if you are not doing well or get worse.   This information is not intended to replace advice given to you by your health care provider. Make sure you discuss any questions you have with your health care provider.   Document Released: 09/04/2005 Document Revised: 11/27/2011 Document Reviewed: 06/21/2011 Elsevier Interactive Patient Education [employer].

## 2015-08-17 NOTE — Progress Notes (Signed)
Pre visit review using our clinic review tool, if applicable. No additional management support is needed unless otherwise documented below in the visit note. 

## 2015-08-27 ENCOUNTER — Ambulatory Visit
Admission: RE | Admit: 2015-08-27 | Discharge: 2015-08-27 | Disposition: A | Discharge status: Home / Self Care | Payer: 59 | Source: Ambulatory Visit

## 2015-08-27 DIAGNOSIS — Z1231 Encounter for screening mammogram for malignant neoplasm of breast: Secondary | ICD-10-CM

## 2015-08-28 ENCOUNTER — Other Ambulatory Visit: Payer: Self-pay | Admitting: Internal Medicine

## 2015-08-30 NOTE — Telephone Encounter (Signed)
ok 

## 2015-08-30 NOTE — Telephone Encounter (Signed)
Pt last office visit 08/17/15 Pt last Rx refill 06/30/15 #120 with one refill    Please advise

## 2015-09-01 ENCOUNTER — Other Ambulatory Visit: Payer: Self-pay | Admitting: Internal Medicine

## 2015-09-24 ENCOUNTER — Ambulatory Visit: Payer: 59 | Admitting: Internal Medicine

## 2015-10-12 ENCOUNTER — Ambulatory Visit (INDEPENDENT_AMBULATORY_CARE_PROVIDER_SITE_OTHER): Payer: 59 | Admitting: Internal Medicine

## 2015-10-12 ENCOUNTER — Encounter: Payer: Self-pay | Admitting: Internal Medicine

## 2015-10-12 VITALS — BP 138/80 | HR 78 | Temp 98.5°F | Resp 20 | Ht 66.5 in | Wt 295.0 lb

## 2015-10-12 DIAGNOSIS — Z86010 Personal history of colonic polyps: Secondary | ICD-10-CM

## 2015-10-12 DIAGNOSIS — I10 Essential (primary) hypertension: Secondary | ICD-10-CM | POA: Diagnosis not present

## 2015-10-12 NOTE — Progress Notes (Signed)
Pre visit review using our clinic review tool, if applicable. No additional management support is needed unless otherwise documented below in the visit note. 

## 2015-10-12 NOTE — Progress Notes (Signed)
Subjective:    Patient ID: Kathy [name], female    DOB: 1951-04-14, 65 y.o.   MRN: [id number]  HPI  BP Readings from Last 3 Encounters:  10/12/15 138/80  08/17/15 126/80  04/09/15 33/71  65 year old patient who has essential hypertension and exogenous obesity.  Doing quite well.  States that it has been a stressful day and blood pressure consistently 140 over 100 today.  No recent home blood pressure monitoring  Wt Readings from Last 3 Encounters:  10/12/15 295 lb (133.811 kg)  08/17/15 296 lb (134.265 kg)  04/09/15 302 lb (136.986 kg)    Past Medical History  Diagnosis Date  . HTN (hypertension)   . Diverticulosis   . Fibromyalgia     Dr Tobie Lords, Rheu  . IBS (irritable bowel syndrome)   . Anxiety and depression   . GERD (gastroesophageal reflux disease)   . Lumbar radiculopathy   . Syncope     idiopathic, ? related to migraines or hyperventilation. Dr Leonie Man, Neurology,  ONE TIME EPISODE AND THIS WAS YEARS AGO  . Obesity   . Anxiety   . Depression   . Heart murmur     mitral valve prolapse--no symptoms  . H/O hiatal hernia   . Headache(784.0)     PAST HX MILD MIGRAINES  . Arthritis     Osteoarthritis, Dr Maureen Ralphs  . Adenomatous colon polyp     2012  . Internal hemorrhoid     Social History   Social History  . Marital Status: Married    Spouse Name: N/A  . Number of Children: N/A  . Years of Education: N/A   Occupational History  . Not on file.   Social History Main Topics  . Smoking status: Never Smoker   . Smokeless tobacco: Never Used  . Alcohol Use: Yes     Comment: occasional  . Drug Use: No  . Sexual Activity: Not on file   Other Topics Concern  . Not on file   Social History Narrative   0 caffeine drinks     Past Surgical History  Procedure Laterality Date  . Abdominal hysterectomy      dysfunctional menses  . Appendectomy    . Cholecystectomy    . Knee arthroscopy  2008    right  . Colonoscopy  05/27/2002, 08/08/11   2003 diverticulosis, hemorrhoids 2012 same + cecal polyp  . Upper gastrointestinal endoscopy  04/21/2009, 08/08/11    2010 gastritis ;2012 small hiatal hernia. Dr Carlean Purl  . Total hip arthroplasty  02/2010    left ; Dr Maureen Ralphs  . Breast lumpectomy      right -BENIGN  . Total hip arthroplasty  03/18/2012    Procedure: TOTAL HIP ARTHROPLASTY;  Surgeon: Gearlean Alf, MD;  Location: [facility];  Service: Orthopedics;  Laterality: Right;  . Melanoma excision Left 07/2013    foot    Family History  Problem Relation Age of Onset  . Colon cancer Neg Hx   . Heart attack Mother 57  . COPD Mother   . Breast cancer Mother   . Hypertension Father   . Diabetes Father   . Heart failure Father 50  . Breast cancer Paternal Aunt     cns mets  . COPD Maternal Grandmother   . Heart disease Paternal Grandmother     died 45  . Stroke Paternal Grandfather     in late 55s  . Diabetes Paternal Grandmother   . Heart attack Brother 39  Allergies  Allergen Reactions  . Penicillins     ? Facial angioedema  . Sulfamethoxazole-Trimethoprim     hives  . Achromycin [Tetracycline Hcl]     Dyspnea    . Nickel   . Zanaflex [Tizanidine Hydrochloride]     Does not recall reaction type     Current Outpatient Prescriptions on File Prior to Visit  Medication Sig Dispense Refill  . ALPRAZolam (XANAX) 1 MG tablet take 1 tablet by mouth four times a day 120 tablet 1  . aspirin 325 MG tablet Take 325 mg by mouth daily.    Marland Kitchen CALCIUM PO Take 1 capsule by mouth daily.    . clobetasol cream (TEMOVATE) 0.05 %     . cyclobenzaprine (FLEXERIL) 10 MG tablet Take 1 tablet (10 mg total) by mouth 4 (four) times daily as needed. SPASMS 30 tablet 5  . famotidine-calcium carbonate-magnesium hydroxide (PEPCID COMPLETE) 10-800-165 MG CHEW chewable tablet 1 -2 tablets as needed to prevent or treat heartburn    . FLUoxetine (PROZAC) 40 MG capsule Take 1 capsule (40 mg total) by mouth daily. 90 capsule 3  . ibuprofen  (ADVIL,MOTRIN) 800 MG tablet Take 800 mg by mouth every 8 (eight) hours as needed. pain    . meclizine (ANTIVERT) 25 MG tablet Take 1 tablet (25 mg total) by mouth 3 (three) times daily as needed for dizziness. 30 tablet 3  . metoprolol succinate (TOPROL-XL) 100 MG 24 hr tablet Take 1 tablet (100 mg total) by mouth daily. Take with or immediately following a meal. 90 tablet 3  . Multiple Vitamin (MULTIVITAMIN) tablet Take 1 tablet by mouth daily.    Marland Kitchen olmesartan (BENICAR) 40 MG tablet Take 1 tablet (40 mg total) by mouth daily. 90 tablet 3  . omeprazole (PRILOSEC) 40 MG capsule Take 1 capsule (40 mg total) by mouth daily. 90 capsule 3   No current facility-administered medications on file prior to visit.    BP 138/80 mmHg  Pulse 78  Temp(Src) 98.5 F (36.9 C) (Oral)  Resp 20  Ht 5' 6.5" (1.689 m)  Wt 295 lb (133.811 kg)  BMI 46.91 kg/m2  SpO2 98%       Review of Systems  Constitutional: Negative.   HENT: Negative for congestion, dental problem, hearing loss, rhinorrhea, sinus pressure, sore throat and tinnitus.   Eyes: Negative for pain, discharge and visual disturbance.  Respiratory: Negative for cough and shortness of breath.   Cardiovascular: Negative for chest pain, palpitations and leg swelling.  Gastrointestinal: Negative for nausea, vomiting, abdominal pain, diarrhea, constipation, blood in stool and abdominal distention.  Genitourinary: Negative for dysuria, urgency, frequency, hematuria, flank pain, vaginal bleeding, vaginal discharge, difficulty urinating, vaginal pain and pelvic pain.  Musculoskeletal: Negative for joint swelling, arthralgias and gait problem.  Skin: Negative for rash.  Neurological: Negative for dizziness, syncope, speech difficulty, weakness, numbness and headaches.  Hematological: Negative for adenopathy.  Psychiatric/Behavioral: Negative for behavioral problems, dysphoric mood and agitation. The patient is nervous/anxious.        Objective:    Physical Exam  Constitutional: She is oriented to person, place, and time. She appears well-developed and well-nourished.  Blood pressure 140/100  HENT:  Head: Normocephalic.  Right Ear: External ear normal.  Left Ear: External ear normal.  Mouth/Throat: Oropharynx is clear and moist.  Eyes: Conjunctivae and EOM are normal. Pupils are equal, round, and reactive to light.  Neck: Normal range of motion. Neck supple. No thyromegaly present.  Cardiovascular: Normal rate, regular rhythm,  normal heart sounds and intact distal pulses.   Pulmonary/Chest: Effort normal and breath sounds normal.  Abdominal: Soft. Bowel sounds are normal. She exhibits no mass. There is no tenderness.  Musculoskeletal: Normal range of motion.  Lymphadenopathy:    She has no cervical adenopathy.  Neurological: She is alert and oriented to person, place, and time.  Skin: Skin is warm and dry. No rash noted.  Psychiatric: She has a normal mood and affect. Her behavior is normal.          Assessment & Plan:    Hypertension.  Unclear control.  Blood pressure consistently elevated today.  Patient is on a [employer] diet.  Have asked the patient to monitor home blood pressure readings more carefully and to report consistently high readings greater than 140 over 90.  Otherwise recheck in 6 months  Obesity.  Continue efforts at weight loss

## 2015-10-12 NOTE — Patient Instructions (Signed)

## 2015-11-03 ENCOUNTER — Other Ambulatory Visit: Payer: Self-pay | Admitting: *Deleted

## 2015-11-04 ENCOUNTER — Other Ambulatory Visit: Payer: Self-pay | Admitting: Internal Medicine

## 2015-11-04 MED ORDER — ALPRAZOLAM 1 MG PO TABS: 1.0000 mg | ORAL_TABLET | Freq: Four times a day (QID) | ORAL | Status: DC

## 2015-11-16 ENCOUNTER — Encounter: Payer: Self-pay | Admitting: Internal Medicine

## 2015-11-16 ENCOUNTER — Ambulatory Visit (INDEPENDENT_AMBULATORY_CARE_PROVIDER_SITE_OTHER): Payer: 59 | Admitting: Internal Medicine

## 2015-11-16 VITALS — BP 128/80 | HR 76 | Ht 66.5 in | Wt 292.0 lb

## 2015-11-16 DIAGNOSIS — Z86010 Personal history of colonic polyps: Secondary | ICD-10-CM | POA: Diagnosis not present

## 2015-11-16 DIAGNOSIS — K219 Gastro-esophageal reflux disease without esophagitis: Secondary | ICD-10-CM | POA: Diagnosis not present

## 2015-11-16 DIAGNOSIS — K589 Irritable bowel syndrome without diarrhea: Secondary | ICD-10-CM

## 2015-11-16 NOTE — Progress Notes (Signed)
   Subjective:    Patient ID: Kathy [name], female    DOB: 04-01-1951, 65 y.o.   MRN: [id number] Cc: GERD, abdominal pain HPI Very nice lady w/ hx GERD. Was having nocturnal sxs lately which are much better since stopping nocturnal decaf tea and lemon.Has had chronically irregular defecation 1-4/day sometimes urgent but no constipation. Has had some bloating and intermittent upper and lower abdominal pains w/ clear trigger - spontaneously resolve.  Medications, allergies, past medical history, past surgical history, family history and social history are reviewed and updated in the EMR.  Review of Systems Losing weight at weight watchers - 10# so far    Objective:   Physical Exam @BP  128/80 mmHg  Pulse 76  Ht 5' 6.5" (1.689 m)  Wt 292 lb (132.45 kg)  BMI 46.43 kg/m2@  General:  NAD Eyes:   anicteric Lungs:  clear Heart:: S1S2 no rubs, murmurs or gallops Abdomen:  soft and nontender, BS+ Psych:  Appropriate mood/affect  Data Reviewed:  As above 2012 colonoscopy w/advanced adenoma - recall 2015 - has not done yet     Assessment & Plan:  IBS (irritable bowel syndrome)  Hx of colonic polyps -   Gastroesophageal reflux disease without esophagitis   Trial of Align  Colonoscopy surveillance exam The risks and benefits as well as alternatives of endoscopic procedure(s) have been discussed and reviewed. All questions answered. The patient agrees to proceed.  Stay on PPI, GERD diet sheet given

## 2015-11-16 NOTE — Patient Instructions (Addendum)
You have been scheduled for a colonoscopy. Please follow written instructions given to you at your visit today.  Please pick up your prep supplies at the pharmacy. If you use inhalers (even only as needed), please bring them with you on the day of your procedure. Your physician has requested that you go to www.startemmi.com and enter the access code given to you at your visit today. This web site gives a general overview about your procedure. However, you should still follow specific instructions given to you by our office regarding your preparation for the procedure.  Please start Align probiotic once a day, coupon provided.  We have given you a handout on diet for gastroesophageal reflux disease.  I appreciate the opportunity to care for you. Silvano Rusk, MD, [facility]

## 2015-11-29 ENCOUNTER — Other Ambulatory Visit (HOSPITAL_COMMUNITY): Payer: Self-pay | Admitting: Orthopedic Surgery

## 2015-11-29 DIAGNOSIS — Z96643 Presence of artificial hip joint, bilateral: Principal | ICD-10-CM

## 2015-11-29 DIAGNOSIS — Z471 Aftercare following joint replacement surgery: Secondary | ICD-10-CM

## 2015-12-07 ENCOUNTER — Telehealth: Payer: Self-pay | Admitting: Internal Medicine

## 2015-12-07 NOTE — Telephone Encounter (Signed)
Noted  

## 2015-12-07 NOTE — Telephone Encounter (Signed)
Patient Name: Kathy [name] DOB: 07-22-1951 Initial Comment Caller states stuffy nose- and is on a medication for the flu from previous Nurse Assessment Nurse: Ronnald Ramp, RN, Miranda Date/Time (Eastern Time): 12/07/2015 1:11:21 PM Confirm and document reason for call. If symptomatic, describe symptoms. You must click the next button to save text entered. ---Caller states she has been feeling bad for about 10 days. She has fever, chills, body aches, and congestion. Also with diarrhea off and on. She has not had a fever since Sunday. Has the patient traveled out of the country within the last 30 days? ---No Does the patient have any new or worsening symptoms? ---Yes Will a triage be completed? ---Yes Related visit to physician within the last 2 weeks? ---No Does the PT have any chronic conditions? (i.e. diabetes, asthma, etc.) ---Yes List chronic conditions. ---Fibromyalgia, Arthritis Is this a behavioral health or substance abuse call? ---No Guidelines Guideline Title Affirmed Question Affirmed Notes Common Cold [1] Sinus congestion (pressure, fullness) AND [2] present > 10 days Final Disposition User See PCP When Office is Open (within 3 days) Ronnald Ramp, RN, Miranda Comments No appt available with PCP within recommended time frame. Appt scheduled with Dr. Sarajane Jews for tomorrow 12/08/15 at 2:00pm

## 2015-12-08 ENCOUNTER — Encounter: Payer: Self-pay | Admitting: Family Medicine

## 2015-12-08 ENCOUNTER — Telehealth: Payer: Self-pay | Admitting: Internal Medicine

## 2015-12-08 ENCOUNTER — Ambulatory Visit (INDEPENDENT_AMBULATORY_CARE_PROVIDER_SITE_OTHER): Payer: 59 | Admitting: Family Medicine

## 2015-12-08 VITALS — BP 163/108 | HR 76 | Temp 98.0°F | Wt 284.0 lb

## 2015-12-08 DIAGNOSIS — J209 Acute bronchitis, unspecified: Secondary | ICD-10-CM | POA: Diagnosis not present

## 2015-12-08 DIAGNOSIS — R6889 Other general symptoms and signs: Secondary | ICD-10-CM | POA: Diagnosis not present

## 2015-12-08 DIAGNOSIS — H65193 Other acute nonsuppurative otitis media, bilateral: Secondary | ICD-10-CM | POA: Diagnosis not present

## 2015-12-08 LAB — POCT INFLUENZA A: Rapid Influenza A Ag: NEGATIVE

## 2015-12-08 MED ORDER — HYDROCODONE-HOMATROPINE 5-1.5 MG/5ML PO SYRP: 5.0000 mL | ORAL_SOLUTION | ORAL | Status: DC | PRN

## 2015-12-08 MED ORDER — METHYLPREDNISOLONE ACETATE 80 MG/ML IJ SUSP
120.0000 mg | Freq: Once | INTRAMUSCULAR | Status: AC
  Administered 2015-12-08: 120 mg via INTRAMUSCULAR

## 2015-12-08 MED ORDER — CEFUROXIME AXETIL 500 MG PO TABS: 500.0000 mg | ORAL_TABLET | Freq: Two times a day (BID) | ORAL | Status: DC

## 2015-12-08 NOTE — Telephone Encounter (Signed)
Tell them it is okay to fill it

## 2015-12-08 NOTE — Progress Notes (Signed)
Pre visit review using our clinic review tool, if applicable. No additional management support is needed unless otherwise documented below in the visit note. 

## 2015-12-08 NOTE — Addendum Note (Signed)
Addended by: Ailene Rud E on: 12/08/2015 03:25 PM   Modules accepted: Orders

## 2015-12-08 NOTE — Telephone Encounter (Signed)
Pharmacy concerned about pt's rx  cefUROXime (CEFTIN) 500 MG tablet  Pt is allergic to penicillin and pt wanted them to check with the doctor to make sure ok to take this. Pharmacy will wait for your call back before filling.

## 2015-12-08 NOTE — Progress Notes (Signed)
   Subjective:    Patient ID: Kathy [name], female    DOB: [birth year], 65 y.o.   MRN: [id number]  HPI Here for 10 days of bilateral ear pain, stuffy head, PND, chest congestion and coughing up yellow sputum. No ST. She has had fever to 101 degrees.    Review of Systems  Constitutional: Positive for fever.  HENT: Positive for congestion, ear pain, hearing loss, postnasal drip, sinus pressure and voice change. Negative for sore throat.   Eyes: Negative.   Respiratory: Positive for cough and chest tightness. Negative for shortness of breath and wheezing.        Objective:   Physical Exam  Constitutional: She appears well-developed and well-nourished.  HENT:  Nose: Nose normal.  Mouth/Throat: Oropharynx is clear and moist.  Both TMs are red and have effusions   Eyes: Conjunctivae are normal.  Neck: No thyromegaly present.  Pulmonary/Chest: Effort normal. She has no wheezes. She has no rales.  Scattered rhonchi   Lymphadenopathy:    She has no cervical adenopathy.          Assessment & Plan:  Bronchitis with bilateral otitis. Treat with Ceftin and a steroid shot. Drink fluids and use Ibuprofen prn

## 2015-12-10 ENCOUNTER — Encounter (HOSPITAL_COMMUNITY): Payer: 59

## 2015-12-10 ENCOUNTER — Ambulatory Visit (HOSPITAL_COMMUNITY): Payer: 59

## 2015-12-21 ENCOUNTER — Encounter (HOSPITAL_COMMUNITY)
Admission: RE | Admit: 2015-12-21 | Discharge: 2015-12-21 | Disposition: A | Discharge status: Home / Self Care | Payer: 59 | Source: Ambulatory Visit | Attending: Orthopedic Surgery | Admitting: Orthopedic Surgery

## 2015-12-21 DIAGNOSIS — Z471 Aftercare following joint replacement surgery: Secondary | ICD-10-CM | POA: Diagnosis present

## 2015-12-21 DIAGNOSIS — Z96643 Presence of artificial hip joint, bilateral: Secondary | ICD-10-CM | POA: Insufficient documentation

## 2015-12-21 MED ORDER — TECHNETIUM TC 99M MEDRONATE IV KIT
26.4000 | PACK | Freq: Once | INTRAVENOUS | Status: AC | PRN
  Administered 2015-12-21: 26.4 via INTRAVENOUS

## 2015-12-29 ENCOUNTER — Ambulatory Visit (AMBULATORY_SURGERY_CENTER): Payer: 59 | Admitting: Internal Medicine

## 2015-12-29 ENCOUNTER — Encounter: Payer: Self-pay | Admitting: Internal Medicine

## 2015-12-29 ENCOUNTER — Other Ambulatory Visit: Payer: Self-pay | Admitting: Internal Medicine

## 2015-12-29 VITALS — BP 127/69 | HR 78 | Temp 98.4°F | Resp 17 | Ht 66.0 in | Wt 292.0 lb

## 2015-12-29 DIAGNOSIS — D120 Benign neoplasm of cecum: Secondary | ICD-10-CM

## 2015-12-29 DIAGNOSIS — Z86010 Personal history of colonic polyps: Secondary | ICD-10-CM | POA: Diagnosis present

## 2015-12-29 MED ORDER — SODIUM CHLORIDE 0.9 % IV SOLN: 500.0000 mL | INTRAVENOUS | Status: DC

## 2015-12-29 NOTE — Progress Notes (Signed)
Report Given to PACU RN, vss

## 2015-12-29 NOTE — Patient Instructions (Addendum)
I found and removed 2 tiny polyps that look benign. I will let you know pathology results and when to have another routine colonoscopy by mail. I anticipate you should do another one in about 5 years. Letter will clarify.  You still have diverticulosis - thickened muscle rings and pouches in the colon wall. Please read the handout about this condition.  I appreciate the opportunity to care for you. Gatha Mayer, MD, FACG       YOU HAD AN ENDOSCOPIC PROCEDURE TODAY AT [facility]:   Refer to the procedure report that was given to you for any specific questions about what was found during the examination.  If the procedure report does not answer your questions, please call your gastroenterologist to clarify.  If you requested that your care partner not be given the details of your procedure findings, then the procedure report has been included in a sealed envelope for you to review at your convenience later.  YOU SHOULD EXPECT: Some feelings of bloating in the abdomen. Passage of more gas than usual.  Walking can help get rid of the air that was put into your GI tract during the procedure and reduce the bloating. If you had a lower endoscopy (such as a colonoscopy or flexible sigmoidoscopy) you may notice spotting of blood in your stool or on the toilet paper. If you underwent a bowel prep for your procedure, you may not have a normal bowel movement for a few days.  Please Note:  You might notice some irritation and congestion in your nose or some drainage.  This is from the oxygen used during your procedure.  There is no need for concern and it should clear up in a day or so.  SYMPTOMS TO REPORT IMMEDIATELY:   Following lower endoscopy (colonoscopy or flexible sigmoidoscopy):  Excessive amounts of blood in the stool  Significant tenderness or worsening of abdominal pains  Swelling of the abdomen that is new, acute  Fever of 100F or higher  For urgent or  emergent issues, a gastroenterologist can be reached at any hour by calling [phone].   DIET: Your first meal following the procedure should be a small meal and then it is ok to progress to your normal diet. Heavy or fried foods are harder to digest and may make you feel nauseous or bloated.  Likewise, meals heavy in dairy and vegetables can increase bloating.  Drink plenty of fluids but you should avoid alcoholic beverages for 24 hours.  ACTIVITY:  You should plan to take it easy for the rest of today and you should NOT DRIVE or use heavy machinery until tomorrow (because of the sedation medicines used during the test).    FOLLOW UP: Our staff will call the number listed on your records the next business day following your procedure to check on you and address any questions or concerns that you may have regarding the information given to you following your procedure. If we do not reach you, we will leave a message.  However, if you are feeling well and you are not experiencing any problems, there is no need to return our call.  We will assume that you have returned to your regular daily activities without incident.  If any biopsies were taken you will be contacted by phone or by letter within the next 1-3 weeks.  Please call us at [phone] if you have not heard about the biopsies in 3 weeks.  SIGNATURES/CONFIDENTIALITY: You and/or your care partner have signed paperwork which will be entered into your electronic medical record.  These signatures attest to the fact that that the information above on your After Visit Summary has been reviewed and is understood.  Full responsibility of the confidentiality of this discharge information lies with you and/or your care-partner.    Handouts were given to your care partner on polyps anddiverticulosis. You may resume your current medications today. Await biopsy results. Please call if any questions or concerns.

## 2015-12-29 NOTE — Op Note (Signed)
[place] Patient Name: Kathy [name] Procedure Date: 12/29/2015 2:37 PM MRN: [id number] Endoscopist: Gatha Mayer , MD Age: 65 Date of Birth: 14-Sep-1951 Gender: Female Procedure:                Colonoscopy Indications:              High risk colon cancer surveillance: Personal                            history of colonic polyps Medicines:                Propofol per Anesthesia, Monitored Anesthesia Care Procedure:                Pre-Anesthesia Assessment:                           - Prior to the procedure, a History and Physical                            was performed, and patient medications and                            allergies were reviewed. The patient's tolerance of                            previous anesthesia was also reviewed. The risks                            and benefits of the procedure and the sedation                            options and risks were discussed with the patient.                            All questions were answered, and informed consent                            was obtained. Prior Anticoagulants: The patient has                            taken no previous anticoagulant or antiplatelet                            agents. ASA Grade Assessment: III - A patient with                            severe systemic disease. After reviewing the risks                            and benefits, the patient was deemed in                            satisfactory condition to undergo the procedure.  After obtaining informed consent, the colonoscope                            was passed under direct vision. Throughout the                            procedure, the patient's blood pressure, pulse, and                            oxygen saturations were monitored continuously. The                            Model PCF-[id number]) scope was introduced                            through the anus and advanced to the the  cecum,                            identified by appendiceal orifice and ileocecal                            valve. Anatomical landmarks were photographed. The                            quality of the bowel preparation was good. The                            bowel preparation used was Miralax. Scope In: 3:16:28 PM Scope Out: 3:30:47 PM Scope Withdrawal Time: 0 hours 9 minutes 32 seconds  Total Procedure Duration: 0 hours 14 minutes 19 seconds  Findings:                 Two sessile polyps were found in the cecum. The                            polyps were 1 mm in size. These polyps were removed                            with a cold biopsy forceps. Resection and retrieval                            were complete.                           Many diverticula were found in the left colon.                            There was no evidence of diverticular bleeding.                           The exam was otherwise without abnormality on                            direct and retroflexion views. Complications:  No immediate complications. Estimated blood loss:                            None. Estimated Blood Loss:     Estimated blood loss: none. Impression:               - Two 1 mm polyps in the cecum, removed with a cold                            biopsy forceps. Resected and retrieved.                           - Severe diverticulosis in the left colon. There                            was no evidence of diverticular bleeding.                           - The examination was otherwise normal on direct                            and retroflexion views. Recommendation:           - Repeat colonoscopy in 5 years for surveillance.                           - Resume previous diet.                           - Patient has a contact number available for                            emergencies. The signs and symptoms of potential                            delayed complications were discussed  with the                            patient. Return to normal activities tomorrow.                            Written discharge instructions were provided to the                            patient.                           - Continue present medications. Gatha Mayer, MD 12/29/2015 3:41:34 PM This report has been signed electronically.

## 2015-12-29 NOTE — Progress Notes (Signed)
No problems noted in the recovery room. maw 

## 2015-12-29 NOTE — Progress Notes (Signed)
Called to room to assist during endoscopic procedure.  Patient ID and intended procedure confirmed with present staff. Received instructions for my participation in the procedure from the performing physician.  

## 2015-12-30 ENCOUNTER — Telehealth: Payer: Self-pay

## 2015-12-30 NOTE — Telephone Encounter (Signed)
  Follow up Call-  Call back number 12/29/2015  Post procedure Call Back phone  # [phone]  Permission to leave phone message Yes    Patient was called for follow up after her procedure on 12/29/2015. No answer at the number given for follow up phone call. A message was left on the answering machine.

## 2016-01-02 ENCOUNTER — Other Ambulatory Visit: Payer: Self-pay | Admitting: Internal Medicine

## 2016-01-08 ENCOUNTER — Encounter: Payer: Self-pay | Admitting: Internal Medicine

## 2016-01-08 DIAGNOSIS — Z86010 Personal history of colonic polyps: Secondary | ICD-10-CM

## 2016-01-08 NOTE — Progress Notes (Signed)
Quick Note:  2 1 mm adenomas recall 2022 ______

## 2016-04-06 ENCOUNTER — Other Ambulatory Visit (INDEPENDENT_AMBULATORY_CARE_PROVIDER_SITE_OTHER): Payer: 59

## 2016-04-06 DIAGNOSIS — Z0000 Encounter for general adult medical examination without abnormal findings: Secondary | ICD-10-CM | POA: Diagnosis not present

## 2016-04-06 LAB — POC URINALSYSI DIPSTICK (AUTOMATED)
Blood, UA: NEGATIVE
Glucose, UA: NEGATIVE
Ketones, UA: NEGATIVE
Leukocytes, UA: NEGATIVE
Nitrite, UA: NEGATIVE
Spec Grav, UA: >=1.03
Urobilinogen, UA: 1
pH, UA: 5.5

## 2016-04-06 LAB — BASIC METABOLIC PANEL
BUN: 16 mg/dL (ref 6–23)
CO2: 30 mEq/L (ref 19–32)
Calcium: 9.3 mg/dL (ref 8.4–10.5)
Chloride: 102 mEq/L (ref 96–112)
Creatinine, Ser: 0.87 mg/dL (ref 0.40–1.20)
GFR: 69.6 mL/min (ref >60.00)
Glucose, Bld: 116 mg/dL — ABNORMAL HIGH (ref 70–99)
Potassium: 3.7 mEq/L (ref 3.5–5.1)
Sodium: 142 mEq/L (ref 135–145)

## 2016-04-06 LAB — HEPATIC FUNCTION PANEL
ALT: 56 U/L — ABNORMAL HIGH (ref 0–35)
AST: 58 U/L — ABNORMAL HIGH (ref 0–37)
Albumin: 4.2 g/dL (ref 3.5–5.2)
Alkaline Phosphatase: 95 U/L (ref 39–117)
Bilirubin, Direct: 0.1 mg/dL (ref 0.0–0.3)
Total Bilirubin: 0.5 mg/dL (ref 0.2–1.2)
Total Protein: 7 g/dL (ref 6.0–8.3)

## 2016-04-06 LAB — CBC WITH DIFFERENTIAL/PLATELET
Basophils Absolute: 0 10*3/uL (ref 0.0–0.1)
Basophils Relative: 0.4 % (ref 0.0–3.0)
Eosinophils Absolute: 0.2 10*3/uL (ref 0.0–0.7)
Eosinophils Relative: 3.5 % (ref 0.0–5.0)
HCT: 40.7 % (ref 36.0–46.0)
Hemoglobin: 13.4 g/dL (ref 12.0–15.0)
Lymphocytes Relative: 30.8 % (ref 12.0–46.0)
Lymphs Abs: 1.8 10*3/uL (ref 0.7–4.0)
MCHC: 33 g/dL (ref 30.0–36.0)
MCV: 93.2 fl (ref 78.0–100.0)
Monocytes Absolute: 0.4 10*3/uL (ref 0.1–1.0)
Monocytes Relative: 6.3 % (ref 3.0–12.0)
Neutro Abs: 3.4 10*3/uL (ref 1.4–7.7)
Neutrophils Relative %: 59 % (ref 43.0–77.0)
Platelets: 220 10*3/uL (ref 150.0–400.0)
RBC: 4.37 Mil/uL (ref 3.87–5.11)
RDW: 15.4 % (ref 11.5–15.5)
WBC: 5.7 10*3/uL (ref 4.0–10.5)

## 2016-04-06 LAB — LIPID PANEL
Cholesterol: 226 mg/dL — ABNORMAL HIGH (ref 0–200)
HDL: 49.4 mg/dL (ref >39.00)
LDL Cholesterol: 145 mg/dL — ABNORMAL HIGH (ref 0–99)
NonHDL: 176.69
Total CHOL/HDL Ratio: 5
Triglycerides: 160 mg/dL — ABNORMAL HIGH (ref 0.0–149.0)
VLDL: 32 mg/dL (ref 0.0–40.0)

## 2016-04-06 LAB — TSH: TSH: 2.42 u[IU]/mL (ref 0.35–4.50)

## 2016-04-11 ENCOUNTER — Encounter: Payer: Self-pay | Admitting: Internal Medicine

## 2016-04-11 ENCOUNTER — Ambulatory Visit (INDEPENDENT_AMBULATORY_CARE_PROVIDER_SITE_OTHER): Payer: 59 | Admitting: Internal Medicine

## 2016-04-11 VITALS — BP 144/80 | HR 76 | Temp 98.1°F | Ht 66.0 in | Wt 297.5 lb

## 2016-04-11 DIAGNOSIS — Z0000 Encounter for general adult medical examination without abnormal findings: Secondary | ICD-10-CM

## 2016-04-11 NOTE — Progress Notes (Signed)
Pre visit review using our clinic review tool, if applicable. No additional management support is needed unless otherwise documented below in the visit note. 

## 2016-04-11 NOTE — Patient Instructions (Signed)
Limit your sodium (Salt) intake  Please check your blood pressure on a regular basis.  If it is consistently greater than 150/90, please make an office appointment.    It is important that you exercise regularly, at least 20 minutes 3 to 4 times per week.  If you develop chest pain or shortness of breath seek  medical attention.  You need to lose weight.  Consider a lower calorie diet and regular exercise.  Take a calcium supplement, plus [phone] units of vitamin D  Return in one year for follow-up

## 2016-04-11 NOTE — Progress Notes (Signed)
Subjective:    Patient ID: Kathy [name], female    DOB: 08-05-1951, 65 y.o.   MRN: [id number]  HPI HPI 24 -year-old patient who is seen today for a preventive health examination.  She is followed closely by GI due to colonic polyps and GERD.  She has had a follow-up colonoscopy in April 2017.  She is followed by rheumatology due to fibromyalgia.  She also has a history of osteoarthritis.  She is status post bilateral total hip replacement surgery.   She has treated hypertension, which has been controlled on dual therapy.  Additionally, she has a history of obesity.  In general, doing quite well today. 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  She is followed annually by dermatology as well as gynecology.  She is status post hysterectomy. More recently she has had some laser vein surgery involving her left leg. In June 2017.  She was evaluated at [facility] for chest pain.  She had a negative nuclear stress test  Past Medical History:  Diagnosis Date  . Adenomatous colon polyp    2012  . Anxiety   . Anxiety and depression   . Arthritis    Osteoarthritis, Dr Maureen Ralphs  . Depression   . Diverticulosis   . Fibromyalgia    Dr Tobie Lords, Rheu  . GERD (gastroesophageal reflux disease)   . H/O hiatal hernia   . Headache(784.0)    PAST HX MILD MIGRAINES  . Heart murmur    mitral valve prolapse--no symptoms  . History of colonic polyps 08/08/2011       . HTN (hypertension)   . IBS (irritable bowel syndrome)   . Internal hemorrhoid   . Lumbar radiculopathy   . Obesity   . Syncope    idiopathic, ? related to migraines or hyperventilation. Dr Leonie Man, Neurology,  ONE TIME EPISODE AND THIS WAS YEARS AGO     Social History   Social History  . Marital status: Married    Spouse name: N/A  . Number of children: N/A  . Years of education: N/A   Occupational History  . Not on file.   Social History Main Topics  . Smoking  status: Never Smoker  . Smokeless tobacco: Never Used  . Alcohol use Yes     Comment: occasional  . Drug use: No  . Sexual activity: Not on file   Other Topics Concern  . Not on file   Social History Narrative   0 caffeine drinks     Past Surgical History:  Procedure Laterality Date  . ABDOMINAL HYSTERECTOMY     dysfunctional menses  . APPENDECTOMY    . BREAST LUMPECTOMY     right -BENIGN  . CHOLECYSTECTOMY    . COLONOSCOPY  05/27/2002, 08/08/11   2003 diverticulosis, hemorrhoids 2012 same + cecal polyp  . KNEE ARTHROSCOPY  2008   right  . MELANOMA EXCISION Left 07/2013   foot  . TOTAL HIP ARTHROPLASTY  02/2010   left ; Dr Maureen Ralphs  . TOTAL HIP ARTHROPLASTY  03/18/2012   Procedure: TOTAL HIP ARTHROPLASTY;  Surgeon: Gearlean Alf, MD;  Location: [facility];  Service: Orthopedics;  Laterality: Right;  . UPPER GASTROINTESTINAL ENDOSCOPY  04/21/2009, 08/08/11   2010 gastritis ;2012 small hiatal hernia. Dr Carlean Purl    Family History  Problem Relation Age of Onset  . Colon cancer Neg Hx   . Heart attack Mother 66  . COPD Mother   .  Breast cancer Mother   . Hypertension Father   . Diabetes Father   . Heart failure Father 92  . Breast cancer Paternal Aunt     cns mets  . COPD Maternal Grandmother   . Heart disease Paternal Grandmother     died 60  . Diabetes Paternal Grandmother   . Stroke Paternal Grandfather     in late 45s  . Heart attack Brother 7    Allergies  Allergen Reactions  . Penicillins     ? Facial angioedema  . Sulfamethoxazole-Trimethoprim     hives  . Achromycin [Tetracycline Hcl]     Dyspnea    . Nickel   . Zanaflex [Tizanidine Hydrochloride]     Does not recall reaction type     Current Outpatient Prescriptions on File Prior to Visit  Medication Sig Dispense Refill  . ALPRAZolam (XANAX) 1 MG tablet take 1 tablet by mouth four times a day 120 tablet 2  . aspirin 325 MG tablet Take 325 mg by mouth daily.    Marland Kitchen CALCIUM PO Take 1 capsule by mouth  daily.    . clobetasol cream (TEMOVATE) 0.05 %     . cyclobenzaprine (FLEXERIL) 10 MG tablet Take 1 tablet (10 mg total) by mouth 4 (four) times daily as needed. SPASMS 30 tablet 5  . famotidine-calcium carbonate-magnesium hydroxide (PEPCID COMPLETE) 10-800-165 MG CHEW chewable tablet 1 -2 tablets as needed to prevent or treat heartburn    . FLUoxetine (PROZAC) 40 MG capsule Take 1 capsule (40 mg total) by mouth daily. 90 capsule 3  . ibuprofen (ADVIL,MOTRIN) 800 MG tablet Take 800 mg by mouth every 8 (eight) hours as needed. pain    . meclizine (ANTIVERT) 25 MG tablet Take 1 tablet (25 mg total) by mouth 3 (three) times daily as needed for dizziness. 30 tablet 3  . metoprolol succinate (TOPROL-XL) 100 MG 24 hr tablet Take 1 tablet (100 mg total) by mouth daily. Take with or immediately following a meal. 90 tablet 3  . Multiple Vitamin (MULTIVITAMIN) tablet Take 1 tablet by mouth daily.    Marland Kitchen olmesartan (BENICAR) 40 MG tablet Take 1 tablet (40 mg total) by mouth daily. 90 tablet 3  . omeprazole (PRILOSEC) 40 MG capsule Take 1 capsule (40 mg total) by mouth daily. 90 capsule 3   No current facility-administered medications on file prior to visit.     BP (!) 144/80   Pulse 76   Temp 98.1 F (36.7 C) (Oral)   Ht 5\' 6"  (1.676 m)   Wt 297 lb 8 oz (134.9 kg)   SpO2 98%   BMI 48.02 kg/m       Review of Systems  Constitutional: Negative for appetite change, fatigue, fever and unexpected weight change.  HENT: Negative for congestion, dental problem, ear pain, hearing loss, mouth sores, nosebleeds, sinus pressure, sore throat, tinnitus, trouble swallowing and voice change.   Eyes: Negative for photophobia, pain, redness and visual disturbance.  Respiratory: Negative for cough, chest tightness and shortness of breath.   Cardiovascular: Negative for chest pain, palpitations and leg swelling.  Gastrointestinal: Negative for abdominal distention, abdominal pain, blood in stool, constipation,  diarrhea, nausea, rectal pain and vomiting.  Genitourinary: Negative for difficulty urinating, dysuria, flank pain, frequency, genital sores, hematuria, menstrual problem, pelvic pain, urgency, vaginal bleeding, vaginal discharge and vaginal pain.  Musculoskeletal: Negative for arthralgias, back pain and neck stiffness.  Skin: Negative for rash.  Neurological: Negative for dizziness, syncope, speech difficulty, weakness, light-headedness,  numbness and headaches.  Hematological: Negative for adenopathy. Does not bruise/bleed easily.  Psychiatric/Behavioral: Negative for agitation, behavioral problems, dysphoric mood, self-injury and suicidal ideas. The patient is not nervous/anxious.        Objective:   Physical Exam  Constitutional: She is oriented to person, place, and time. She appears well-developed and well-nourished.  Blood pressure 140/80 Weight 297  HENT:  Head: Normocephalic and atraumatic.  Right Ear: External ear normal.  Left Ear: External ear normal.  Mouth/Throat: Oropharynx is clear and moist.  Eyes: Conjunctivae and EOM are normal.  Neck: Normal range of motion. Neck supple. No JVD present. No thyromegaly present.  Cardiovascular: Normal rate, regular rhythm, normal heart sounds and intact distal pulses.   No murmur heard. Pulmonary/Chest: Effort normal and breath sounds normal. She has no wheezes. She has no rales.  Abdominal: Soft. Bowel sounds are normal. She exhibits no distension and no mass. There is no tenderness. There is no rebound and no guarding.  Musculoskeletal: Normal range of motion. She exhibits no edema or tenderness.  Neurological: She is alert and oriented to person, place, and time. She has normal reflexes. No cranial nerve deficit. She exhibits normal muscle tone. Coordination normal.  Skin: Skin is warm and dry. No rash noted.  Psychiatric: She has a normal mood and affect. Her behavior is normal.          Assessment & Plan:   Preventive  health examination Exogenous obesity.  Weight loss encouraged Fibromyalgia Osteoarthritis status post bilateral total hip replacement surgeries GERD Colonic polyps.  Continue colonoscopy surveillance every 5 years Hypertension, controlled Impaired glucose tolerance  Follow-up multiple consultants Return here in one year or as needed Laboratory studies reviewed  Nyoka Cowden, MD

## 2016-04-12 ENCOUNTER — Other Ambulatory Visit: Payer: Self-pay | Admitting: Internal Medicine

## 2016-04-16 ENCOUNTER — Other Ambulatory Visit: Payer: Self-pay | Admitting: Internal Medicine

## 2016-05-08 ENCOUNTER — Other Ambulatory Visit: Payer: Self-pay

## 2016-05-08 ENCOUNTER — Other Ambulatory Visit: Payer: Self-pay | Admitting: Internal Medicine

## 2016-05-08 DIAGNOSIS — R2231 Localized swelling, mass and lump, right upper limb: Secondary | ICD-10-CM

## 2016-05-09 ENCOUNTER — Encounter: Payer: Self-pay | Admitting: Family Medicine

## 2016-05-09 ENCOUNTER — Ambulatory Visit (INDEPENDENT_AMBULATORY_CARE_PROVIDER_SITE_OTHER): Payer: 59 | Admitting: Family Medicine

## 2016-05-09 VITALS — BP 122/92 | HR 83 | Temp 99.0°F | Ht 66.0 in | Wt 292.8 lb

## 2016-05-09 DIAGNOSIS — N644 Mastodynia: Secondary | ICD-10-CM | POA: Diagnosis not present

## 2016-05-09 NOTE — Progress Notes (Signed)
HPI:   Acute visit for:  Axillary/Breast pain: -started a few weeks ago -symptoms: intermittent pain in R breast and axillary region, feels may have lump here -called breast center for eval and they advised she needed to see [name] first for referral -denies: fevers, malaise, nipple discharge, skin changes -reports normal mammo 08/2015 -sees Dr. Raliegh Ip and gyn, Dr. Harrington Challenger for physicals  ROS: See pertinent positives and negatives per HPI.  Past Medical History:  Diagnosis Date  . Adenomatous colon polyp    2012  . Anxiety   . Anxiety and depression   . Arthritis    Osteoarthritis, Dr Maureen Ralphs  . Depression   . Diverticulosis   . Fibromyalgia    Dr Tobie Lords, Rheu  . GERD (gastroesophageal reflux disease)   . H/O hiatal hernia   . Headache(784.0)    PAST HX MILD MIGRAINES  . Heart murmur    mitral valve prolapse--no symptoms  . History of colonic polyps 08/08/2011       . HTN (hypertension)   . IBS (irritable bowel syndrome)   . Internal hemorrhoid   . Lumbar radiculopathy   . Obesity   . Syncope    idiopathic, ? related to migraines or hyperventilation. Dr Leonie Man, Neurology,  ONE TIME EPISODE AND THIS WAS YEARS AGO    Past Surgical History:  Procedure Laterality Date  . ABDOMINAL HYSTERECTOMY     dysfunctional menses  . APPENDECTOMY    . BREAST LUMPECTOMY     right -BENIGN  . CHOLECYSTECTOMY    . COLONOSCOPY  05/27/2002, 08/08/11   2003 diverticulosis, hemorrhoids 2012 same + cecal polyp  . KNEE ARTHROSCOPY  2008   right  . MELANOMA EXCISION Left 07/2013   foot  . TOTAL HIP ARTHROPLASTY  02/2010   left ; Dr Maureen Ralphs  . TOTAL HIP ARTHROPLASTY  03/18/2012   Procedure: TOTAL HIP ARTHROPLASTY;  Surgeon: Gearlean Alf, MD;  Location: [facility];  Service: Orthopedics;  Laterality: Right;  . UPPER GASTROINTESTINAL ENDOSCOPY  04/21/2009, 08/08/11   2010 gastritis ;2012 small hiatal hernia. Dr Carlean Purl    Family History  Problem Relation Age of Onset  . Colon cancer Neg Hx    . Heart attack Mother 90  . COPD Mother   . Breast cancer Mother   . Hypertension Father   . Diabetes Father   . Heart failure Father 46  . Breast cancer Paternal Aunt     cns mets  . COPD Maternal Grandmother   . Heart disease Paternal Grandmother     died 27  . Diabetes Paternal Grandmother   . Stroke Paternal Grandfather     in late 62s  . Heart attack Brother 62    Social History   Social History  . Marital status: Married    Spouse name: N/A  . Number of children: N/A  . Years of education: N/A   Social History Main Topics  . Smoking status: Never Smoker  . Smokeless tobacco: Never Used  . Alcohol use Yes     Comment: occasional  . Drug use: No  . Sexual activity: Not Asked   Other Topics Concern  . None   Social History Narrative   0 caffeine drinks      Current Outpatient Prescriptions:  .  ALPRAZolam (XANAX) 1 MG tablet, take 1 tablet by mouth four times a day, Disp: 120 tablet, Rfl: 2 .  aspirin 325 MG tablet, Take 325 mg by mouth daily., Disp: , Rfl:  .  BENICAR 40 MG tablet, take 1 tablet by mouth once daily, Disp: 60 tablet, Rfl: 5 .  CALCIUM PO, Take 1 capsule by mouth daily., Disp: , Rfl:  .  clobetasol cream (TEMOVATE) 0.05 %, , Disp: , Rfl:  .  cyclobenzaprine (FLEXERIL) 10 MG tablet, Take 1 tablet (10 mg total) by mouth 4 (four) times daily as needed. SPASMS, Disp: 30 tablet, Rfl: 5 .  famotidine-calcium carbonate-magnesium hydroxide (PEPCID COMPLETE) 10-800-165 MG CHEW chewable tablet, 1 -2 tablets as needed to prevent or treat heartburn, Disp: , Rfl:  .  FLUoxetine (PROZAC) 40 MG capsule, take 1 capsule by mouth once daily, Disp: 90 capsule, Rfl: 2 .  ibuprofen (ADVIL,MOTRIN) 800 MG tablet, Take 800 mg by mouth every 8 (eight) hours as needed. pain, Disp: , Rfl:  .  meclizine (ANTIVERT) 25 MG tablet, Take 1 tablet (25 mg total) by mouth 3 (three) times daily as needed for dizziness., Disp: 30 tablet, Rfl: 3 .  metoprolol succinate (TOPROL-XL)  100 MG 24 hr tablet, take 1 tablet by mouth once daily take WITH OR IMMEDIATELY FOLLOWING A MEAL, Disp: 60 tablet, Rfl: 0 .  Multiple Vitamin (MULTIVITAMIN) tablet, Take 1 tablet by mouth daily., Disp: , Rfl:  .  omeprazole (PRILOSEC) 40 MG capsule, take 1 capsule by mouth once daily, Disp: 60 capsule, Rfl: 5  EXAM:  Vitals:   05/09/16 1438  BP: (!) 122/92  Pulse: 83  Temp: 99 F (37.2 C)    Body mass index is 47.26 kg/m.  GENERAL: vitals reviewed and listed above, alert, oriented, appears well hydrated and in no acute distress  HEENT: atraumatic, conjunttiva clear, no obvious abnormalities on inspection of external nose and ears  NECK: no obvious masses on inspection  BREAST: normal appearance both breasts without appreciable skin rash, redness or warmth. Som difuse TTP in the R upper outer quadrant and axillary region with question of increase density of tissue compared to L in a rather difuse area in the R upper outer quadrant of the R breast  MS: moves all extremities without noticeable abnormality  PSYCH: pleasant and cooperative, no obvious depression or anxiety  ASSESSMENT AND PLAN:  Discussed the following assessment and plan:  Pain of right breast - Plan: MM Digital Diagnostic Unilat R, US BREAST COMPLETE UNI RIGHT INC AXILLA  -she is quite anxious as mother had breast ca -will order urgent eval at breast center -Patient advised to return or notify a doctor immediately if symptoms worsen or persist or new concerns arise.  Patient Instructions  -We placed a referral for you as discussed to the breast center. It usually takes about several days to process and schedule this referral. If you have not heard from [name] regarding this appointment in 3 days please contact our office.    Colin Benton R., DO

## 2016-05-09 NOTE — Patient Instructions (Signed)
-  We placed a referral for you as discussed to the breast center. It usually takes about several days to process and schedule this referral. If you have not heard from [name] regarding this appointment in 3 days please contact our office.

## 2016-05-09 NOTE — Progress Notes (Signed)
Pre visit review using our clinic review tool, if applicable. No additional management support is needed unless otherwise documented below in the visit note. 

## 2016-05-15 ENCOUNTER — Other Ambulatory Visit: Payer: Self-pay | Admitting: Family Medicine

## 2016-05-15 DIAGNOSIS — N63 Unspecified lump in unspecified breast: Secondary | ICD-10-CM

## 2016-06-14 ENCOUNTER — Other Ambulatory Visit: Payer: Self-pay | Admitting: Internal Medicine

## 2016-06-19 ENCOUNTER — Ambulatory Visit
Admission: RE | Admit: 2016-06-19 | Discharge: 2016-06-19 | Disposition: A | Discharge status: Home / Self Care | Payer: 59 | Source: Ambulatory Visit | Attending: Family Medicine | Admitting: Family Medicine

## 2016-06-19 DIAGNOSIS — N63 Unspecified lump in unspecified breast: Secondary | ICD-10-CM

## 2016-06-28 ENCOUNTER — Other Ambulatory Visit: Payer: Self-pay | Admitting: Internal Medicine

## 2016-06-28 NOTE — Telephone Encounter (Signed)
Last filled 01/04/2016 #120 with 2 refills. Last seen 07.25.17 for annual exam.      Okay to refill?

## 2016-06-28 NOTE — Telephone Encounter (Signed)
Yes thanks, #60 with future refills by Dr. Raliegh Ip

## 2016-06-29 NOTE — Telephone Encounter (Signed)
Rx called in 

## 2016-07-26 ENCOUNTER — Ambulatory Visit: Payer: 59 | Admitting: Internal Medicine

## 2016-07-26 ENCOUNTER — Other Ambulatory Visit: Payer: Self-pay | Admitting: Family Medicine

## 2016-07-31 ENCOUNTER — Ambulatory Visit (INDEPENDENT_AMBULATORY_CARE_PROVIDER_SITE_OTHER): Payer: 59 | Admitting: Internal Medicine

## 2016-07-31 ENCOUNTER — Encounter: Payer: Self-pay | Admitting: Internal Medicine

## 2016-07-31 VITALS — BP 142/90 | HR 66 | Temp 98.1°F | Ht 66.0 in | Wt 300.2 lb

## 2016-07-31 DIAGNOSIS — I10 Essential (primary) hypertension: Secondary | ICD-10-CM | POA: Diagnosis not present

## 2016-07-31 DIAGNOSIS — J301 Allergic rhinitis due to pollen: Secondary | ICD-10-CM | POA: Diagnosis not present

## 2016-07-31 DIAGNOSIS — E6609 Other obesity due to excess calories: Secondary | ICD-10-CM | POA: Diagnosis not present

## 2016-07-31 DIAGNOSIS — J309 Allergic rhinitis, unspecified: Secondary | ICD-10-CM | POA: Insufficient documentation

## 2016-07-31 DIAGNOSIS — IMO0001 Reserved for inherently not codable concepts without codable children: Secondary | ICD-10-CM

## 2016-07-31 MED ORDER — FLUTICASONE PROPIONATE 50 MCG/ACT NA SUSP: 2.0000 | Freq: Every day | NASAL | 6 refills | Status: DC

## 2016-07-31 NOTE — Patient Instructions (Signed)
Limit your sodium (Salt) intake  Please check your blood pressure on a regular basis.  If it is consistently greater than 150/90, please make an office appointment.  You need to lose weight.  Consider a lower calorie diet and regular exercise.

## 2016-07-31 NOTE — Progress Notes (Signed)
Pre visit review using our clinic review tool, if applicable. No additional management support is needed unless otherwise documented below in the visit note. 

## 2016-07-31 NOTE — Progress Notes (Signed)
Subjective:    Patient ID: Kathy [name], female    DOB: 10-Nov-1950, 65 y.o.   MRN: [id number]  HPI 65 year old patient seen today for her 6 month follow-up.  She has a history of essential hypertension.  She has a history of osteoarthritis and fibromyalgia and is scheduled to see rheumatology later this week.  She is doing well. Complaints today include chronic cough, postnasal drip.  Cough is occasionally productive of clear sputum.  She is no longer on fluticasone nasal spray or antihistamines.  Past Medical History:  Diagnosis Date  . Adenomatous colon polyp    2012  . Anxiety   . Anxiety and depression   . Arthritis    Osteoarthritis, Dr Maureen Ralphs  . Depression   . Diverticulosis   . Fibromyalgia    Dr Tobie Lords, Rheu  . GERD (gastroesophageal reflux disease)   . H/O hiatal hernia   . Headache(784.0)    PAST HX MILD MIGRAINES  . Heart murmur    mitral valve prolapse--no symptoms  . History of colonic polyps 08/08/2011       . HTN (hypertension)   . IBS (irritable bowel syndrome)   . Internal hemorrhoid   . Lumbar radiculopathy   . Obesity   . Syncope    idiopathic, ? related to migraines or hyperventilation. Dr Leonie Man, Neurology,  ONE TIME EPISODE AND THIS WAS YEARS AGO     Social History   Social History  . Marital status: Married    Spouse name: N/A  . Number of children: N/A  . Years of education: N/A   Occupational History  . Not on file.   Social History Main Topics  . Smoking status: Never Smoker  . Smokeless tobacco: Never Used  . Alcohol use Yes     Comment: occasional  . Drug use: No  . Sexual activity: Not on file   Other Topics Concern  . Not on file   Social History Narrative   0 caffeine drinks     Past Surgical History:  Procedure Laterality Date  . ABDOMINAL HYSTERECTOMY     dysfunctional menses  . APPENDECTOMY    . BREAST LUMPECTOMY     right -BENIGN  . CHOLECYSTECTOMY    . COLONOSCOPY  05/27/2002, 08/08/11   2003  diverticulosis, hemorrhoids 2012 same + cecal polyp  . KNEE ARTHROSCOPY  2008   right  . MELANOMA EXCISION Left 07/2013   foot  . TOTAL HIP ARTHROPLASTY  02/2010   left ; Dr Maureen Ralphs  . TOTAL HIP ARTHROPLASTY  03/18/2012   Procedure: TOTAL HIP ARTHROPLASTY;  Surgeon: Gearlean Alf, MD;  Location: [facility];  Service: Orthopedics;  Laterality: Right;  . UPPER GASTROINTESTINAL ENDOSCOPY  04/21/2009, 08/08/11   2010 gastritis ;2012 small hiatal hernia. Dr Carlean Purl    Family History  Problem Relation Age of Onset  . Colon cancer Neg Hx   . Heart attack Mother 37  . COPD Mother   . Breast cancer Mother   . Hypertension Father   . Diabetes Father   . Heart failure Father 54  . Breast cancer Paternal Aunt     cns mets  . COPD Maternal Grandmother   . Heart disease Paternal Grandmother     died 4  . Diabetes Paternal Grandmother   . Stroke Paternal Grandfather     in late 68s  . Heart attack Brother 87    Allergies  Allergen Reactions  . Penicillins     ? Facial  angioedema  . Sulfamethoxazole-Trimethoprim     hives  . Achromycin [Tetracycline Hcl]     Dyspnea    . Nickel   . Zanaflex [Tizanidine Hydrochloride]     Does not recall reaction type     Current Outpatient Prescriptions on File Prior to Visit  Medication Sig Dispense Refill  . ALPRAZolam (XANAX) 1 MG tablet take 1 tablet by mouth four times a day 60 tablet 2  . aspirin 325 MG tablet Take 325 mg by mouth daily.    Marland Kitchen BENICAR 40 MG tablet take 1 tablet by mouth once daily 60 tablet 5  . CALCIUM PO Take 1 capsule by mouth daily.    . clobetasol cream (TEMOVATE) 0.05 %     . cyclobenzaprine (FLEXERIL) 10 MG tablet Take 1 tablet (10 mg total) by mouth 4 (four) times daily as needed. SPASMS 30 tablet 5  . famotidine-calcium carbonate-magnesium hydroxide (PEPCID COMPLETE) 10-800-165 MG CHEW chewable tablet 1 -2 tablets as needed to prevent or treat heartburn    . FLUoxetine (PROZAC) 40 MG capsule take 1 capsule by mouth  once daily 90 capsule 2  . ibuprofen (ADVIL,MOTRIN) 800 MG tablet Take 800 mg by mouth every 8 (eight) hours as needed. pain    . meclizine (ANTIVERT) 25 MG tablet Take 1 tablet (25 mg total) by mouth 3 (three) times daily as needed for dizziness. 30 tablet 3  . metoprolol succinate (TOPROL-XL) 100 MG 24 hr tablet take 1 tablet by mouth once daily take WITH OR IMMEDIATELY FOLLOWING A MEAL 60 tablet 5  . Multiple Vitamin (MULTIVITAMIN) tablet Take 1 tablet by mouth daily.    Marland Kitchen omeprazole (PRILOSEC) 40 MG capsule take 1 capsule by mouth once daily 60 capsule 5   No current facility-administered medications on file prior to visit.     BP (!) 142/90 (BP Location: Left Arm, Patient Position: Sitting, Cuff Size: Large)   Pulse 66   Temp 98.1 F (36.7 C) (Oral)   Ht 5\' 6"  (1.676 m)   Wt (!) 300 lb 4 oz (136.2 kg)   SpO2 98%   BMI 48.46 kg/m      Review of Systems  Constitutional: Negative.   HENT: Negative for congestion, dental problem, hearing loss, rhinorrhea, sinus pressure, sore throat and tinnitus.   Eyes: Negative for pain, discharge and visual disturbance.  Respiratory: Negative for cough and shortness of breath.   Cardiovascular: Negative for chest pain, palpitations and leg swelling.  Gastrointestinal: Negative for abdominal distention, abdominal pain, blood in stool, constipation, diarrhea, nausea and vomiting.  Genitourinary: Negative for difficulty urinating, dysuria, flank pain, frequency, hematuria, pelvic pain, urgency, vaginal bleeding, vaginal discharge and vaginal pain.  Musculoskeletal: Negative for arthralgias, gait problem and joint swelling.  Skin: Negative for rash.  Neurological: Negative for dizziness, syncope, speech difficulty, weakness, numbness and headaches.  Hematological: Negative for adenopathy.  Psychiatric/Behavioral: Negative for agitation, behavioral problems and dysphoric mood. The patient is not nervous/anxious.        Objective:   Physical  Exam  Constitutional: She is oriented to person, place, and time. She appears well-developed and well-nourished.  Repeat blood pressure 130/84  HENT:  Head: Normocephalic.  Right Ear: External ear normal.  Left Ear: External ear normal.  Mouth/Throat: Oropharynx is clear and moist.  Eyes: Conjunctivae and EOM are normal. Pupils are equal, round, and reactive to light.  Neck: Normal range of motion. Neck supple. No thyromegaly present.  Cardiovascular: Normal rate, regular rhythm, normal heart sounds and  intact distal pulses.   Pulmonary/Chest: Effort normal and breath sounds normal. No respiratory distress. She has no wheezes. She has no rales.  Abdominal: Soft. Bowel sounds are normal. She exhibits no mass. There is no tenderness.  Musculoskeletal: Normal range of motion.  Lymphadenopathy:    She has no cervical adenopathy.  Neurological: She is alert and oriented to person, place, and time.  Skin: Skin is warm and dry. No rash noted.  Psychiatric: She has a normal mood and affect. Her behavior is normal.          Assessment & Plan:   Essential hypertension, stable.  No change in medical regimen Allergic rhinitis.  Patient resume fluticasone nasal spray and nonsedating antihistamine Obesity.  Weight loss encouraged Fibromyalgia.  Follow-up rheumatology  CPX here 6 months  Aneesa Romey Pilar Plate

## 2016-08-17 ENCOUNTER — Other Ambulatory Visit: Payer: Self-pay | Admitting: Family Medicine

## 2016-08-17 DIAGNOSIS — Z1231 Encounter for screening mammogram for malignant neoplasm of breast: Secondary | ICD-10-CM

## 2016-08-31 ENCOUNTER — Ambulatory Visit
Admission: RE | Admit: 2016-08-31 | Discharge: 2016-08-31 | Disposition: A | Discharge status: Home / Self Care | Payer: 59 | Source: Ambulatory Visit | Attending: Family Medicine | Admitting: Family Medicine

## 2016-08-31 DIAGNOSIS — Z1231 Encounter for screening mammogram for malignant neoplasm of breast: Secondary | ICD-10-CM

## 2016-09-06 ENCOUNTER — Other Ambulatory Visit: Payer: Self-pay | Admitting: Internal Medicine

## 2016-10-25 ENCOUNTER — Other Ambulatory Visit: Payer: Self-pay | Admitting: Internal Medicine

## 2016-10-27 ENCOUNTER — Ambulatory Visit (INDEPENDENT_AMBULATORY_CARE_PROVIDER_SITE_OTHER): Payer: 59 | Admitting: Psychology

## 2016-10-27 DIAGNOSIS — F23 Brief psychotic disorder: Secondary | ICD-10-CM | POA: Diagnosis not present

## 2016-11-16 ENCOUNTER — Ambulatory Visit: Payer: 59 | Admitting: Psychology

## 2016-11-29 DIAGNOSIS — H16223 Keratoconjunctivitis sicca, not specified as Sjogren's, bilateral: Secondary | ICD-10-CM | POA: Diagnosis not present

## 2016-12-06 ENCOUNTER — Ambulatory Visit (INDEPENDENT_AMBULATORY_CARE_PROVIDER_SITE_OTHER): Payer: Medicare Other | Admitting: Psychology

## 2016-12-06 DIAGNOSIS — F23 Brief psychotic disorder: Secondary | ICD-10-CM | POA: Diagnosis not present

## 2016-12-08 ENCOUNTER — Other Ambulatory Visit: Payer: Self-pay | Admitting: Internal Medicine

## 2016-12-13 DIAGNOSIS — H04123 Dry eye syndrome of bilateral lacrimal glands: Secondary | ICD-10-CM | POA: Diagnosis not present

## 2017-01-02 ENCOUNTER — Ambulatory Visit (INDEPENDENT_AMBULATORY_CARE_PROVIDER_SITE_OTHER): Payer: Medicare Other | Admitting: Psychology

## 2017-01-02 DIAGNOSIS — F321 Major depressive disorder, single episode, moderate: Secondary | ICD-10-CM

## 2017-01-09 ENCOUNTER — Other Ambulatory Visit: Payer: Self-pay | Admitting: Internal Medicine

## 2017-01-17 ENCOUNTER — Ambulatory Visit (INDEPENDENT_AMBULATORY_CARE_PROVIDER_SITE_OTHER): Payer: Medicare Other | Admitting: Psychology

## 2017-01-17 DIAGNOSIS — F321 Major depressive disorder, single episode, moderate: Secondary | ICD-10-CM | POA: Diagnosis not present

## 2017-01-21 ENCOUNTER — Other Ambulatory Visit: Payer: Self-pay | Admitting: Internal Medicine

## 2017-01-23 DIAGNOSIS — H04123 Dry eye syndrome of bilateral lacrimal glands: Secondary | ICD-10-CM | POA: Diagnosis not present

## 2017-02-01 DIAGNOSIS — M7550 Bursitis of unspecified shoulder: Secondary | ICD-10-CM | POA: Diagnosis not present

## 2017-02-01 DIAGNOSIS — M150 Primary generalized (osteo)arthritis: Secondary | ICD-10-CM | POA: Diagnosis not present

## 2017-02-01 DIAGNOSIS — Z6841 Body Mass Index (BMI) 40.0 and over, adult: Secondary | ICD-10-CM | POA: Diagnosis not present

## 2017-02-01 DIAGNOSIS — M797 Fibromyalgia: Secondary | ICD-10-CM | POA: Diagnosis not present

## 2017-02-01 DIAGNOSIS — M2550 Pain in unspecified joint: Secondary | ICD-10-CM | POA: Diagnosis not present

## 2017-02-01 DIAGNOSIS — M545 Low back pain: Secondary | ICD-10-CM | POA: Diagnosis not present

## 2017-02-07 DIAGNOSIS — M150 Primary generalized (osteo)arthritis: Secondary | ICD-10-CM | POA: Diagnosis not present

## 2017-02-07 DIAGNOSIS — M7550 Bursitis of unspecified shoulder: Secondary | ICD-10-CM | POA: Diagnosis not present

## 2017-02-07 DIAGNOSIS — Z79899 Other long term (current) drug therapy: Secondary | ICD-10-CM | POA: Diagnosis not present

## 2017-02-07 LAB — CBC AND DIFFERENTIAL
HCT: 45 % (ref 36–46)
Hemoglobin: 14.8 g/dL (ref 12.0–16.0)
Neutrophils Absolute: 4 /uL
Platelets: 214 10*3/uL (ref 150–399)
WBC: 5.4 10^3/mL

## 2017-02-07 LAB — BASIC METABOLIC PANEL
BUN: 16 mg/dL (ref 4–21)
Creatinine: 0.8 mg/dL (ref 0.5–1.1)
Glucose: 102 mg/dL
Potassium: 4.4 mmol/L (ref 3.4–5.3)
Sodium: 140 mmol/L (ref 137–147)

## 2017-02-07 LAB — HEPATIC FUNCTION PANEL
ALT: 54 U/L — AB (ref 7–35)
AST: 41 U/L — AB (ref 13–35)
Alkaline Phosphatase: 89 U/L (ref 25–125)
Bilirubin, Total: 0.5 mg/dL

## 2017-02-09 ENCOUNTER — Encounter: Payer: Self-pay | Admitting: Family Medicine

## 2017-02-12 ENCOUNTER — Emergency Department (HOSPITAL_COMMUNITY): Payer: Medicare Other

## 2017-02-12 ENCOUNTER — Observation Stay (HOSPITAL_COMMUNITY)
Admission: EM | Admit: 2017-02-12 | Discharge: 2017-02-13 | Disposition: A | Discharge status: Home / Self Care | Payer: Medicare Other | Attending: Family Medicine | Admitting: Family Medicine

## 2017-02-12 ENCOUNTER — Encounter (HOSPITAL_COMMUNITY): Payer: Self-pay

## 2017-02-12 DIAGNOSIS — Z7982 Long term (current) use of aspirin: Secondary | ICD-10-CM | POA: Insufficient documentation

## 2017-02-12 DIAGNOSIS — I341 Nonrheumatic mitral (valve) prolapse: Secondary | ICD-10-CM | POA: Insufficient documentation

## 2017-02-12 DIAGNOSIS — M169 Osteoarthritis of hip, unspecified: Secondary | ICD-10-CM | POA: Diagnosis present

## 2017-02-12 DIAGNOSIS — K589 Irritable bowel syndrome without diarrhea: Secondary | ICD-10-CM | POA: Diagnosis not present

## 2017-02-12 DIAGNOSIS — Z86718 Personal history of other venous thrombosis and embolism: Secondary | ICD-10-CM | POA: Insufficient documentation

## 2017-02-12 DIAGNOSIS — K219 Gastro-esophageal reflux disease without esophagitis: Secondary | ICD-10-CM | POA: Diagnosis not present

## 2017-02-12 DIAGNOSIS — W19XXXA Unspecified fall, initial encounter: Secondary | ICD-10-CM

## 2017-02-12 DIAGNOSIS — Z90710 Acquired absence of both cervix and uterus: Secondary | ICD-10-CM | POA: Diagnosis not present

## 2017-02-12 DIAGNOSIS — M545 Low back pain, unspecified: Secondary | ICD-10-CM | POA: Diagnosis present

## 2017-02-12 DIAGNOSIS — G8929 Other chronic pain: Secondary | ICD-10-CM | POA: Insufficient documentation

## 2017-02-12 DIAGNOSIS — R102 Pelvic and perineal pain: Secondary | ICD-10-CM | POA: Diagnosis not present

## 2017-02-12 DIAGNOSIS — M160 Bilateral primary osteoarthritis of hip: Secondary | ICD-10-CM | POA: Insufficient documentation

## 2017-02-12 DIAGNOSIS — Z8719 Personal history of other diseases of the digestive system: Secondary | ICD-10-CM | POA: Diagnosis not present

## 2017-02-12 DIAGNOSIS — F419 Anxiety disorder, unspecified: Secondary | ICD-10-CM | POA: Insufficient documentation

## 2017-02-12 DIAGNOSIS — M5116 Intervertebral disc disorders with radiculopathy, lumbar region: Secondary | ICD-10-CM | POA: Diagnosis not present

## 2017-02-12 DIAGNOSIS — R109 Unspecified abdominal pain: Secondary | ICD-10-CM | POA: Diagnosis not present

## 2017-02-12 DIAGNOSIS — S3992XA Unspecified injury of lower back, initial encounter: Secondary | ICD-10-CM | POA: Diagnosis not present

## 2017-02-12 DIAGNOSIS — Z833 Family history of diabetes mellitus: Secondary | ICD-10-CM | POA: Insufficient documentation

## 2017-02-12 DIAGNOSIS — Z881 Allergy status to other antibiotic agents status: Secondary | ICD-10-CM | POA: Insufficient documentation

## 2017-02-12 DIAGNOSIS — I10 Essential (primary) hypertension: Secondary | ICD-10-CM | POA: Diagnosis present

## 2017-02-12 DIAGNOSIS — W010XXA Fall on same level from slipping, tripping and stumbling without subsequent striking against object, initial encounter: Secondary | ICD-10-CM | POA: Diagnosis not present

## 2017-02-12 DIAGNOSIS — Y9389 Activity, other specified: Secondary | ICD-10-CM | POA: Insufficient documentation

## 2017-02-12 DIAGNOSIS — Z6841 Body Mass Index (BMI) 40.0 and over, adult: Secondary | ICD-10-CM | POA: Insufficient documentation

## 2017-02-12 DIAGNOSIS — S0003XA Contusion of scalp, initial encounter: Secondary | ICD-10-CM | POA: Diagnosis not present

## 2017-02-12 DIAGNOSIS — Z85820 Personal history of malignant melanoma of skin: Secondary | ICD-10-CM | POA: Insufficient documentation

## 2017-02-12 DIAGNOSIS — K5730 Diverticulosis of large intestine without perforation or abscess without bleeding: Secondary | ICD-10-CM | POA: Insufficient documentation

## 2017-02-12 DIAGNOSIS — S0101XA Laceration without foreign body of scalp, initial encounter: Secondary | ICD-10-CM | POA: Diagnosis not present

## 2017-02-12 DIAGNOSIS — R262 Difficulty in walking, not elsewhere classified: Secondary | ICD-10-CM | POA: Diagnosis present

## 2017-02-12 DIAGNOSIS — I700 Atherosclerosis of aorta: Secondary | ICD-10-CM | POA: Diagnosis not present

## 2017-02-12 DIAGNOSIS — Z86010 Personal history of colonic polyps: Secondary | ICD-10-CM | POA: Insufficient documentation

## 2017-02-12 DIAGNOSIS — E669 Obesity, unspecified: Secondary | ICD-10-CM | POA: Diagnosis not present

## 2017-02-12 DIAGNOSIS — Z825 Family history of asthma and other chronic lower respiratory diseases: Secondary | ICD-10-CM | POA: Insufficient documentation

## 2017-02-12 DIAGNOSIS — R0902 Hypoxemia: Secondary | ICD-10-CM | POA: Diagnosis not present

## 2017-02-12 DIAGNOSIS — S0993XA Unspecified injury of face, initial encounter: Secondary | ICD-10-CM | POA: Diagnosis not present

## 2017-02-12 DIAGNOSIS — S3993XA Unspecified injury of pelvis, initial encounter: Secondary | ICD-10-CM | POA: Diagnosis not present

## 2017-02-12 DIAGNOSIS — Z96643 Presence of artificial hip joint, bilateral: Secondary | ICD-10-CM | POA: Insufficient documentation

## 2017-02-12 DIAGNOSIS — Z8249 Family history of ischemic heart disease and other diseases of the circulatory system: Secondary | ICD-10-CM | POA: Diagnosis not present

## 2017-02-12 DIAGNOSIS — M50321 Other cervical disc degeneration at C4-C5 level: Secondary | ICD-10-CM | POA: Insufficient documentation

## 2017-02-12 DIAGNOSIS — Y92009 Unspecified place in unspecified non-institutional (private) residence as the place of occurrence of the external cause: Secondary | ICD-10-CM | POA: Diagnosis not present

## 2017-02-12 DIAGNOSIS — M797 Fibromyalgia: Secondary | ICD-10-CM | POA: Diagnosis not present

## 2017-02-12 DIAGNOSIS — Z91048 Other nonmedicinal substance allergy status: Secondary | ICD-10-CM | POA: Insufficient documentation

## 2017-02-12 DIAGNOSIS — Z79899 Other long term (current) drug therapy: Secondary | ICD-10-CM | POA: Insufficient documentation

## 2017-02-12 DIAGNOSIS — M542 Cervicalgia: Principal | ICD-10-CM | POA: Insufficient documentation

## 2017-02-12 DIAGNOSIS — F329 Major depressive disorder, single episode, unspecified: Secondary | ICD-10-CM | POA: Insufficient documentation

## 2017-02-12 DIAGNOSIS — Z882 Allergy status to sulfonamides status: Secondary | ICD-10-CM | POA: Insufficient documentation

## 2017-02-12 DIAGNOSIS — Z888 Allergy status to other drugs, medicaments and biological substances status: Secondary | ICD-10-CM | POA: Insufficient documentation

## 2017-02-12 DIAGNOSIS — Z803 Family history of malignant neoplasm of breast: Secondary | ICD-10-CM | POA: Insufficient documentation

## 2017-02-12 DIAGNOSIS — S299XXA Unspecified injury of thorax, initial encounter: Secondary | ICD-10-CM | POA: Diagnosis not present

## 2017-02-12 DIAGNOSIS — Z880 Allergy status to penicillin: Secondary | ICD-10-CM | POA: Insufficient documentation

## 2017-02-12 DIAGNOSIS — S3991XA Unspecified injury of abdomen, initial encounter: Secondary | ICD-10-CM | POA: Diagnosis not present

## 2017-02-12 DIAGNOSIS — Z9049 Acquired absence of other specified parts of digestive tract: Secondary | ICD-10-CM | POA: Insufficient documentation

## 2017-02-12 DIAGNOSIS — Z823 Family history of stroke: Secondary | ICD-10-CM | POA: Insufficient documentation

## 2017-02-12 DIAGNOSIS — S0990XA Unspecified injury of head, initial encounter: Secondary | ICD-10-CM | POA: Diagnosis not present

## 2017-02-12 LAB — I-STAT TROPONIN, ED: Troponin i, poc: 0 ng/mL (ref 0.00–0.08)

## 2017-02-12 LAB — COMPREHENSIVE METABOLIC PANEL
ALT: 47 U/L (ref 14–54)
AST: 44 U/L — ABNORMAL HIGH (ref 15–41)
Albumin: 3.8 g/dL (ref 3.5–5.0)
Alkaline Phosphatase: 80 U/L (ref 38–126)
Anion gap: 13 (ref 5–15)
BUN: 16 mg/dL (ref 6–20)
CO2: 23 mmol/L (ref 22–32)
Calcium: 8.5 mg/dL — ABNORMAL LOW (ref 8.9–10.3)
Chloride: 99 mmol/L — ABNORMAL LOW (ref 101–111)
Creatinine, Ser: 0.9 mg/dL (ref 0.44–1.00)
GFR calc Af Amer: >60 mL/min (ref >60)
GFR calc non Af Amer: >60 mL/min (ref >60)
Glucose, Bld: 120 mg/dL — ABNORMAL HIGH (ref 65–99)
Potassium: 3.9 mmol/L (ref 3.5–5.1)
Sodium: 135 mmol/L (ref 135–145)
Total Bilirubin: 0.7 mg/dL (ref 0.3–1.2)
Total Protein: 6.2 g/dL — ABNORMAL LOW (ref 6.5–8.1)

## 2017-02-12 LAB — CBC WITH DIFFERENTIAL/PLATELET
Basophils Absolute: 0 10*3/uL (ref 0.0–0.1)
Basophils Relative: 0 %
Eosinophils Absolute: 0.2 10*3/uL (ref 0.0–0.7)
Eosinophils Relative: 3 %
HCT: 42.5 % (ref 36.0–46.0)
Hemoglobin: 13.7 g/dL (ref 12.0–15.0)
Lymphocytes Relative: 20 %
Lymphs Abs: 1.1 10*3/uL (ref 0.7–4.0)
MCH: 30.9 pg (ref 26.0–34.0)
MCHC: 32.2 g/dL (ref 30.0–36.0)
MCV: 95.7 fL (ref 78.0–100.0)
Monocytes Absolute: 0.3 10*3/uL (ref 0.1–1.0)
Monocytes Relative: 5 %
Neutro Abs: 3.7 10*3/uL (ref 1.7–7.7)
Neutrophils Relative %: 72 %
Platelets: 178 10*3/uL (ref 150–400)
RBC: 4.44 MIL/uL (ref 3.87–5.11)
RDW: 14.6 % (ref 11.5–15.5)
WBC: 5.2 10*3/uL (ref 4.0–10.5)

## 2017-02-12 MED ORDER — MORPHINE SULFATE (PF) 4 MG/ML IV SOLN
4.0000 mg | Freq: Once | INTRAVENOUS | Status: AC
  Administered 2017-02-12: 4 mg via INTRAVENOUS
  Filled 2017-02-12: qty 1

## 2017-02-12 MED ORDER — IOPAMIDOL (ISOVUE-370) INJECTION 76%
INTRAVENOUS | Status: AC
  Administered 2017-02-12: 100 mL
  Filled 2017-02-12: qty 100

## 2017-02-12 MED ORDER — ONDANSETRON HCL 4 MG/2ML IJ SOLN
4.0000 mg | Freq: Once | INTRAMUSCULAR | Status: AC
  Administered 2017-02-12: 4 mg via INTRAVENOUS
  Filled 2017-02-12: qty 2

## 2017-02-12 MED ORDER — HYDROMORPHONE HCL 1 MG/ML IJ SOLN
1.0000 mg | Freq: Once | INTRAMUSCULAR | Status: AC
  Administered 2017-02-12: 1 mg via INTRAVENOUS
  Filled 2017-02-12: qty 1

## 2017-02-12 NOTE — ED Notes (Signed)
Patient transported to CT 

## 2017-02-12 NOTE — ED Notes (Signed)
Pt states she feels nauseous

## 2017-02-12 NOTE — ED Triage Notes (Signed)
Patient brought by [place] for a trip and fall at home.  Unknown LOC, patient unsure.  Patient given 100 mcg of fentanyl for back and hip pain.  Vitals stable.  Patient on [place] board and c collar per EMS protocol.  History of bilateral hip replacements.  Pain in bilateral hips now.

## 2017-02-12 NOTE — ED Notes (Signed)
Patient returned from CT and XRAY.

## 2017-02-12 NOTE — ED Provider Notes (Signed)
[facility] DEPT Provider Note   CSN: [id number] Arrival date & time: 02/12/17  1811     History   Chief Complaint Chief Complaint  Patient presents with  . Fall  . Hip Pain    hx of replacements     HPI Kathy [name] is a 66 y.o. female hx of GERD, depression, HTN, Previous hip replacement here presenting with fall. Patient states that she was walking and her floor was wet and she slipped and fell onto her back and hip. She states that she may have hit her head but did not remember but denies passing out. Patient crawled over to the living room and called EMS. EMS put her on [place] board and C collar. Patient also was given fentanyl 100 g and was noted to be hypoxic 87% afterwards. Patient is not on oxygen at home.  The history is provided by the patient.    Past Medical History:  Diagnosis Date  . Adenomatous colon polyp    2012  . Anxiety   . Anxiety and depression   . Arthritis    Osteoarthritis, Dr Maureen Ralphs  . Depression   . Diverticulosis   . Fibromyalgia    Dr Tobie Lords, Rheu  . GERD (gastroesophageal reflux disease)   . H/O hiatal hernia   . Headache(784.0)    PAST HX MILD MIGRAINES  . Heart murmur    mitral valve prolapse--no symptoms  . History of colonic polyps 08/08/2011       . HTN (hypertension)   . IBS (irritable bowel syndrome)   . Internal hemorrhoid   . Lumbar radiculopathy   . Obesity   . Syncope    idiopathic, ? related to migraines or hyperventilation. Dr Leonie Man, Neurology,  ONE TIME EPISODE AND THIS WAS YEARS AGO    Patient Active Problem List   Diagnosis Date Noted  . Allergic rhinitis 07/31/2016  . Malignant melanoma ([place]) 08/31/2013  . Other abnormal glucose 01/22/2013  . Anemia, unspecified 01/22/2013  . OA (osteoarthritis) of hip 03/18/2012  . Personal history of DVT (deep vein thrombosis) 01/05/2012  . History of colonic polyps 08/08/2011  . ARTHRITIS, HIP 01/24/2010  . PERSONAL HISTORY OF FAILED MODERATE SEDATION  07/21/2009  . Obesity 04/05/2009  . Esophageal reflux 02/25/2009  . HEART VALVE DISEASE 12/28/2008  . HEADACHE 04/21/2008  . Essential hypertension 10/15/2007  . LUMBAR RADICULOPATHY, RIGHT 10/15/2007  . FATIGUE 10/15/2007  . IRRITABLE BOWEL SYNDROME 07/23/2007  . FIBROMYALGIA 07/23/2007    Past Surgical History:  Procedure Laterality Date  . ABDOMINAL HYSTERECTOMY     dysfunctional menses  . APPENDECTOMY    . BREAST LUMPECTOMY     right -BENIGN  . CHOLECYSTECTOMY    . COLONOSCOPY  05/27/2002, 08/08/11   2003 diverticulosis, hemorrhoids 2012 same + cecal polyp  . KNEE ARTHROSCOPY  2008   right  . MELANOMA EXCISION Left 07/2013   foot  . TOTAL HIP ARTHROPLASTY  02/2010   left ; Dr Maureen Ralphs  . TOTAL HIP ARTHROPLASTY  03/18/2012   Procedure: TOTAL HIP ARTHROPLASTY;  Surgeon: Gearlean Alf, MD;  Location: [facility];  Service: Orthopedics;  Laterality: Right;  . UPPER GASTROINTESTINAL ENDOSCOPY  04/21/2009, 08/08/11   2010 gastritis ;2012 small hiatal hernia. Dr Carlean Purl    OB History    No data available       Home Medications    Prior to Admission medications   Medication Sig Start Date End Date Taking? Authorizing Provider  ALPRAZolam [name]) 1 MG  tablet take 1 tablet by mouth four times a day Patient taking differently: take 1mg  by mouth four times a day as needed for anxiety 01/22/17  Yes Marletta Lor, MD  aspirin 325 MG tablet Take 325 mg by mouth daily.   Yes [provider]  BENICAR 40 MG tablet take 1 tablet by mouth once daily Patient taking differently: take 1 tablet (40mg ) by mouth once daily 04/13/16  Yes Marletta Lor, MD  CALCIUM PO Take 1 capsule by mouth daily.   Yes [provider]  clobetasol cream (TEMOVATE) 7.62 % Apply 1 application topically daily.  09/25/13  Yes [provider]  cyclobenzaprine (FLEXERIL) 10 MG tablet Take 1 tablet (10 mg total) by mouth 4 (four) times daily as needed. SPASMS 04/09/15  Yes Marletta Lor, MD  cycloSPORINE (RESTASIS) 0.05 % ophthalmic emulsion Place 1 drop into both eyes 2 (two) times daily.   Yes [provider]  famotidine-calcium carbonate-magnesium hydroxide (PEPCID COMPLETE) 10-800-165 MG CHEW chewable tablet 1 -2 tablets as needed to prevent or treat heartburn Patient taking differently: Chew 1-2 tablets by mouth daily as needed (heartburn).  08/20/13  Yes Gatha Mayer, MD  FLUoxetine (PROZAC) 40 MG capsule take 1 capsule by mouth once daily Patient taking differently: take 1 capsule (40mg ) by mouth every evening 01/09/17  Yes Marletta Lor, MD  fluticasone [facility]) 50 MCG/ACT nasal spray Place 2 sprays into both nostrils daily. 07/31/16  Yes Marletta Lor, MD  ibuprofen (ADVIL,MOTRIN) 800 MG tablet Take 800 mg by mouth every 8 (eight) hours as needed. pain   Yes [provider]  meclizine (ANTIVERT) 25 MG tablet Take 1 tablet (25 mg total) by mouth 3 (three) times daily as needed for dizziness. 04/09/15  Yes Marletta Lor, MD  metoprolol succinate (TOPROL-XL) 100 MG 24 hr tablet take 1 tablet by mouth once daily take WITH OR IMMEDIATELY FOLLOWING A MEAL Patient taking differently: take 1 tablet (100mg ) by mouth once daily take WITH OR IMMEDIATELY FOLLOWING A MEAL 06/14/16  Yes Marletta Lor, MD  Multiple Vitamin (MULTIVITAMIN) tablet Take 1 tablet by mouth daily.   Yes [provider]  omeprazole (PRILOSEC) 40 MG capsule take 1 capsule by mouth once daily 04/13/16  Yes Marletta Lor, MD    Family History Family History  Problem Relation Age of Onset  . Heart attack Mother 74  . COPD Mother   . Breast cancer Mother   . Hypertension Father   . Diabetes Father   . Heart failure Father 35  . Breast cancer Paternal Aunt        cns mets  . COPD Maternal Grandmother   . Heart disease Paternal Grandmother        died 52  . Diabetes Paternal Grandmother   . Stroke Paternal Grandfather        in late 27s    . Heart attack Brother 74  . Colon cancer Neg Hx     Social History Social History  Substance Use Topics  . Smoking status: Never Smoker  . Smokeless tobacco: Never Used  . Alcohol use Yes     Comment: occasional     Allergies   Penicillins; Sulfamethoxazole-trimethoprim; Achromycin [tetracycline hcl]; Nickel; and Zanaflex [tizanidine hydrochloride]   Review of Systems Review of Systems  Musculoskeletal: Positive for back pain.       Bilateral hip pain   All other systems reviewed and are negative.    Physical  Exam Updated Vital Signs BP (!) 149/74   Pulse 67   Temp 97.8 F (36.6 C) (Oral)   Resp 13   SpO2 (S) (!) 88%   Physical Exam  Constitutional: She is oriented to person, place, and time.  Uncomfortable   HENT:  Head: Normocephalic.  No obvious scalp hematoma   Eyes: EOM are normal. Pupils are equal, round, and reactive to light.  Neck:  C collar   Cardiovascular: Normal rate, regular rhythm and normal heart sounds.   Pulmonary/Chest: Effort normal and breath sounds normal. No respiratory distress. She has no wheezes. She has no rales.  Abdominal: Soft. Bowel sounds are normal. She exhibits no distension. There is no tenderness. There is no guarding.  Musculoskeletal:  Mild lower lumbar tenderness. Pelvis stable. Able to range both hips. No obvious extremity trauma   Neurological: She is alert and oriented to person, place, and time. No cranial nerve deficit. Coordination normal.  Skin: Skin is warm.  Psychiatric: She has a normal mood and affect.  Nursing note and vitals reviewed.    ED Treatments / Results  Labs (all labs ordered are listed, but only abnormal results are displayed) Labs Reviewed  COMPREHENSIVE METABOLIC PANEL - Abnormal; Notable for the following:       Result Value   Chloride 99 (*)    Glucose, Bld 120 (*)    Calcium 8.5 (*)    Total Protein 6.2 (*)    AST 44 (*)    All other components within normal limits  CBC WITH  DIFFERENTIAL/PLATELET  URINALYSIS, ROUTINE W REFLEX MICROSCOPIC  I-STAT TROPOININ, ED    EKG  EKG Interpretation  Date/Time:  Monday Feb 12 2017 18:16:18 EDT Ventricular Rate:  70 PR Interval:    QRS Duration: 108 QT Interval:  434 QTC Calculation: 469 R Axis:   68 Text Interpretation:  Sinus rhythm Left atrial enlargement No significant change since last tracing Confirmed by Nayla Dias  MD, Clio Gerhart ([id number]) on 02/12/2017 7:07:39 PM       Radiology Dg Lumbar Spine Complete  Result Date: 02/12/2017 CLINICAL DATA:  Fall.  Low back pain. EXAM: LUMBAR SPINE - COMPLETE 4+ VIEW COMPARISON:  03/28/2012. FINDINGS: There is no evidence of lumbar spine fracture. Alignment is normal except for trace facet mediated anterolisthesis L4-5. Intervertebral disc spaces are grossly maintained, slight narrowing L5-S1. IMPRESSION: No posttraumatic sequelae are evident. Similar appearance to priors. Electronically Signed   By: Staci Righter M.D.   On: 02/12/2017 20:15   Ct Head Wo Contrast  Result Date: 02/12/2017 CLINICAL DATA:  Neck pain after fall. EXAM: CT HEAD WITHOUT CONTRAST CT CERVICAL SPINE WITHOUT CONTRAST TECHNIQUE: Multidetector CT imaging of the head and cervical spine was performed following the standard protocol without intravenous contrast. Multiplanar CT image reconstructions of the cervical spine were also generated. COMPARISON:  CT scan of March 28, 2012. FINDINGS: CT HEAD FINDINGS Brain: No evidence of acute infarction, hemorrhage, hydrocephalus, extra-axial collection or mass lesion/mass effect. Vascular: No hyperdense vessel or unexpected calcification. Skull: Normal. Negative for fracture or focal lesion. Sinuses/Orbits: No acute finding. Other: Small right frontal scalp hematoma is noted. CT CERVICAL SPINE FINDINGS Alignment: Normal. Skull base and vertebrae: No acute fracture. No primary bone lesion or focal pathologic process. Soft tissues and spinal canal: No prevertebral fluid or swelling. No  visible canal hematoma. Disc levels: Moderate degenerative disc disease is noted at C4-5, C5-6 and C6-7 with anterior osteophyte formation. Upper chest: Negative. Other: None. IMPRESSION: Small right frontal scalp  hematoma. No acute intracranial abnormality seen. Multilevel degenerative disc disease. No acute abnormality seen in the cervical spine. Electronically Signed   By: Marijo Conception, M.D.   On: 02/12/2017 20:17   Ct Cervical Spine Wo Contrast  Result Date: 02/12/2017 CLINICAL DATA:  Neck pain after fall. EXAM: CT HEAD WITHOUT CONTRAST CT CERVICAL SPINE WITHOUT CONTRAST TECHNIQUE: Multidetector CT imaging of the head and cervical spine was performed following the standard protocol without intravenous contrast. Multiplanar CT image reconstructions of the cervical spine were also generated. COMPARISON:  CT scan of March 28, 2012. FINDINGS: CT HEAD FINDINGS Brain: No evidence of acute infarction, hemorrhage, hydrocephalus, extra-axial collection or mass lesion/mass effect. Vascular: No hyperdense vessel or unexpected calcification. Skull: Normal. Negative for fracture or focal lesion. Sinuses/Orbits: No acute finding. Other: Small right frontal scalp hematoma is noted. CT CERVICAL SPINE FINDINGS Alignment: Normal. Skull base and vertebrae: No acute fracture. No primary bone lesion or focal pathologic process. Soft tissues and spinal canal: No prevertebral fluid or swelling. No visible canal hematoma. Disc levels: Moderate degenerative disc disease is noted at C4-5, C5-6 and C6-7 with anterior osteophyte formation. Upper chest: Negative. Other: None. IMPRESSION: Small right frontal scalp hematoma. No acute intracranial abnormality seen. Multilevel degenerative disc disease. No acute abnormality seen in the cervical spine. Electronically Signed   By: Marijo Conception, M.D.   On: 02/12/2017 20:17   Dg Pelvis Portable  Result Date: 02/12/2017 CLINICAL DATA:  Pelvic pain after fall at home. EXAM: PORTABLE  PELVIS 1-2 VIEWS COMPARISON:  None. FINDINGS: There is no evidence of pelvic fracture or diastasis. No pelvic bone lesions are seen. Status post bilateral total hip arthroplasties. IMPRESSION: No acute abnormality seen in the pelvis. Electronically Signed   By: Marijo Conception, M.D.   On: 02/12/2017 19:23   Dg Chest Port 1 View  Result Date: 02/12/2017 CLINICAL DATA:  Fall in kitchen. EXAM: PORTABLE CHEST 1 VIEW COMPARISON:  Radiographs of March 11, 2012. FINDINGS: Stable cardiomediastinal silhouette. No pneumothorax or pleural effusion is noted. Both lungs are clear. The visualized skeletal structures are unremarkable. IMPRESSION: No acute cardiopulmonary abnormality seen. Electronically Signed   By: Marijo Conception, M.D.   On: 02/12/2017 19:22    Procedures Procedures (including critical care time)  Medications Ordered in ED Medications  ondansetron (ZOFRAN) injection 4 mg (not administered)  morphine 4 MG/ML injection 4 mg (4 mg Intravenous Given 02/12/17 1911)  HYDROmorphone (DILAUDID) injection 1 mg (1 mg Intravenous Given 02/12/17 2051)  ondansetron (ZOFRAN) injection 4 mg (4 mg Intravenous Given 02/12/17 2051)  iopamidol (ISOVUE-370) 76 % injection (100 mLs  Contrast Given 02/12/17 2221)     Initial Impression / Assessment and Plan / ED Course  I have reviewed the triage vital signs and the nursing notes.  Pertinent labs & imaging results that were available during my care of the patient were reviewed by me and considered in my medical decision making (see chart for details).      JOAQUINA NISSEN is a 66 y.o. female here with back pain, hip pain, ? Hypoxia after fall. I think hypoxia likely from given pain meds. Will get CT head/neck, labs. Will get xrays. Will observe.   11:31 PM Labs unremarkable. xrays showed no obvious fracture. Persistently hypoxic to 80s and still in severe pain and unable to ambulate. Ordered CT angio chest, ct ab/pel with lumbar recon. Will admit to  medicine service for pain control, hypoxia. Hospitalist to admit.   Final  Clinical Impressions(s) / ED Diagnoses   Final diagnoses:  Fall  Hypoxia  Injury of back, initial encounter    New Prescriptions New Prescriptions   No medications on file     Drenda Freeze, MD 02/12/17 2333

## 2017-02-13 ENCOUNTER — Other Ambulatory Visit (HOSPITAL_COMMUNITY): Payer: Self-pay

## 2017-02-13 ENCOUNTER — Encounter (HOSPITAL_COMMUNITY): Payer: Self-pay | Admitting: Internal Medicine

## 2017-02-13 DIAGNOSIS — M545 Low back pain, unspecified: Secondary | ICD-10-CM | POA: Diagnosis present

## 2017-02-13 DIAGNOSIS — M5440 Lumbago with sciatica, unspecified side: Secondary | ICD-10-CM | POA: Diagnosis not present

## 2017-02-13 DIAGNOSIS — R262 Difficulty in walking, not elsewhere classified: Secondary | ICD-10-CM | POA: Diagnosis present

## 2017-02-13 DIAGNOSIS — I10 Essential (primary) hypertension: Secondary | ICD-10-CM

## 2017-02-13 DIAGNOSIS — M542 Cervicalgia: Secondary | ICD-10-CM | POA: Diagnosis not present

## 2017-02-13 LAB — URINALYSIS, ROUTINE W REFLEX MICROSCOPIC
Bilirubin Urine: NEGATIVE
Glucose, UA: NEGATIVE mg/dL
Hgb urine dipstick: NEGATIVE
Ketones, ur: NEGATIVE mg/dL
Leukocytes, UA: NEGATIVE
Nitrite: NEGATIVE
Protein, ur: NEGATIVE mg/dL
Specific Gravity, Urine: >1.046 — ABNORMAL HIGH (ref 1.005–1.030)
pH: 5 (ref 5.0–8.0)

## 2017-02-13 LAB — CBC
HCT: 43.2 % (ref 36.0–46.0)
Hemoglobin: 13.5 g/dL (ref 12.0–15.0)
MCH: 30.5 pg (ref 26.0–34.0)
MCHC: 31.3 g/dL (ref 30.0–36.0)
MCV: 97.7 fL (ref 78.0–100.0)
Platelets: 180 10*3/uL (ref 150–400)
RBC: 4.42 MIL/uL (ref 3.87–5.11)
RDW: 14.9 % (ref 11.5–15.5)
WBC: 6.1 10*3/uL (ref 4.0–10.5)

## 2017-02-13 LAB — BASIC METABOLIC PANEL
Anion gap: 10 (ref 5–15)
BUN: 16 mg/dL (ref 6–20)
CO2: 27 mmol/L (ref 22–32)
Calcium: 8.8 mg/dL — ABNORMAL LOW (ref 8.9–10.3)
Chloride: 100 mmol/L — ABNORMAL LOW (ref 101–111)
Creatinine, Ser: 0.89 mg/dL (ref 0.44–1.00)
GFR calc Af Amer: >60 mL/min (ref >60)
GFR calc non Af Amer: >60 mL/min (ref >60)
Glucose, Bld: 123 mg/dL — ABNORMAL HIGH (ref 65–99)
Potassium: 4.2 mmol/L (ref 3.5–5.1)
Sodium: 137 mmol/L (ref 135–145)

## 2017-02-13 LAB — BRAIN NATRIURETIC PEPTIDE: B Natriuretic Peptide: 177.3 pg/mL — ABNORMAL HIGH (ref 0.0–100.0)

## 2017-02-13 LAB — TSH: TSH: 0.877 u[IU]/mL (ref 0.350–4.500)

## 2017-02-13 LAB — TROPONIN I
Troponin I: <0.03 ng/mL (ref <0.03)
Troponin I: <0.03 ng/mL (ref <0.03)
Troponin I: <0.03 ng/mL (ref <0.03)

## 2017-02-13 MED ORDER — ADULT MULTIVITAMIN W/MINERALS CH
1.0000 | ORAL_TABLET | Freq: Every day | ORAL | Status: DC
  Administered 2017-02-13: 1 via ORAL
  Filled 2017-02-13: qty 1

## 2017-02-13 MED ORDER — ALPRAZOLAM 0.25 MG PO TABS
1.0000 mg | ORAL_TABLET | Freq: Four times a day (QID) | ORAL | Status: DC
  Administered 2017-02-13 (×2): 1 mg via ORAL
  Filled 2017-02-13 (×3): qty 4

## 2017-02-13 MED ORDER — FLUOXETINE HCL 20 MG PO CAPS
40.0000 mg | ORAL_CAPSULE | Freq: Every day | ORAL | Status: DC
  Administered 2017-02-13: 40 mg via ORAL
  Filled 2017-02-13: qty 2

## 2017-02-13 MED ORDER — ORAL CARE MOUTH RINSE
15.0000 mL | Freq: Two times a day (BID) | OROMUCOSAL | Status: DC
  Administered 2017-02-13 (×2): 15 mL via OROMUCOSAL

## 2017-02-13 MED ORDER — VITAMIN B-1 100 MG PO TABS
100.0000 mg | ORAL_TABLET | Freq: Every day | ORAL | Status: DC
  Administered 2017-02-13: 100 mg via ORAL
  Filled 2017-02-13: qty 1

## 2017-02-13 MED ORDER — LORAZEPAM 1 MG PO TABS: 1.0000 mg | ORAL_TABLET | Freq: Four times a day (QID) | ORAL | Status: DC | PRN

## 2017-02-13 MED ORDER — METOPROLOL SUCCINATE ER 100 MG PO TB24
100.0000 mg | ORAL_TABLET | Freq: Every day | ORAL | Status: DC
  Filled 2017-02-13: qty 1

## 2017-02-13 MED ORDER — CYCLOSPORINE 0.05 % OP EMUL
1.0000 [drp] | Freq: Two times a day (BID) | OPHTHALMIC | Status: DC
  Administered 2017-02-13: 1 [drp] via OPHTHALMIC
  Filled 2017-02-13: qty 1

## 2017-02-13 MED ORDER — FOLIC ACID 1 MG PO TABS
1.0000 mg | ORAL_TABLET | Freq: Every day | ORAL | Status: DC
  Administered 2017-02-13: 1 mg via ORAL
  Filled 2017-02-13: qty 1

## 2017-02-13 MED ORDER — ACETAMINOPHEN 650 MG RE SUPP: 650.0000 mg | Freq: Four times a day (QID) | RECTAL | Status: DC | PRN

## 2017-02-13 MED ORDER — ALBUTEROL SULFATE (2.5 MG/3ML) 0.083% IN NEBU
2.5000 mg | INHALATION_SOLUTION | RESPIRATORY_TRACT | Status: DC
  Administered 2017-02-13: 2.5 mg via RESPIRATORY_TRACT
  Filled 2017-02-13: qty 3

## 2017-02-13 MED ORDER — LORAZEPAM 2 MG/ML IJ SOLN: 1.0000 mg | Freq: Four times a day (QID) | INTRAMUSCULAR | Status: DC | PRN

## 2017-02-13 MED ORDER — ONDANSETRON HCL 4 MG PO TABS: 4.0000 mg | ORAL_TABLET | Freq: Four times a day (QID) | ORAL | Status: DC | PRN

## 2017-02-13 MED ORDER — CLOBETASOL PROPIONATE 0.05 % EX CREA
1.0000 "application " | TOPICAL_CREAM | Freq: Every day | CUTANEOUS | Status: DC
  Filled 2017-02-13: qty 15

## 2017-02-13 MED ORDER — ONDANSETRON HCL 4 MG/2ML IJ SOLN
4.0000 mg | Freq: Four times a day (QID) | INTRAMUSCULAR | Status: DC | PRN
  Administered 2017-02-13: 4 mg via INTRAVENOUS
  Filled 2017-02-13: qty 2

## 2017-02-13 MED ORDER — ACETAMINOPHEN 325 MG PO TABS
650.0000 mg | ORAL_TABLET | Freq: Four times a day (QID) | ORAL | Status: DC | PRN
  Administered 2017-02-13: 650 mg via ORAL
  Filled 2017-02-13: qty 2

## 2017-02-13 MED ORDER — MECLIZINE HCL 25 MG PO TABS: 25.0000 mg | ORAL_TABLET | Freq: Three times a day (TID) | ORAL | Status: DC | PRN

## 2017-02-13 MED ORDER — ALBUTEROL SULFATE (2.5 MG/3ML) 0.083% IN NEBU: 2.5000 mg | INHALATION_SOLUTION | RESPIRATORY_TRACT | Status: DC | PRN

## 2017-02-13 MED ORDER — CYCLOBENZAPRINE HCL 10 MG PO TABS
10.0000 mg | ORAL_TABLET | Freq: Four times a day (QID) | ORAL | Status: DC | PRN
  Administered 2017-02-13 (×3): 10 mg via ORAL
  Filled 2017-02-13 (×3): qty 1

## 2017-02-13 MED ORDER — IRBESARTAN 300 MG PO TABS
300.0000 mg | ORAL_TABLET | Freq: Every day | ORAL | Status: DC
  Administered 2017-02-13: 300 mg via ORAL
  Filled 2017-02-13: qty 1

## 2017-02-13 MED ORDER — PANTOPRAZOLE SODIUM 40 MG PO TBEC
40.0000 mg | DELAYED_RELEASE_TABLET | Freq: Every day | ORAL | Status: DC
  Administered 2017-02-13: 40 mg via ORAL
  Filled 2017-02-13: qty 1

## 2017-02-13 MED ORDER — THIAMINE HCL 100 MG/ML IJ SOLN: 100.0000 mg | Freq: Every day | INTRAMUSCULAR | Status: DC

## 2017-02-13 MED ORDER — FLUTICASONE PROPIONATE 50 MCG/ACT NA SUSP
2.0000 | Freq: Every day | NASAL | Status: DC
  Filled 2017-02-13: qty 16

## 2017-02-13 MED ORDER — FENTANYL CITRATE (PF) 100 MCG/2ML IJ SOLN
50.0000 ug | INTRAMUSCULAR | Status: DC | PRN
  Administered 2017-02-13: 50 ug via INTRAVENOUS
  Filled 2017-02-13: qty 2

## 2017-02-13 NOTE — Progress Notes (Signed)
Kathy [name] to be D/C'd Home per MD order.  Discussed with the patient and all questions fully answered.  VSS, Skin clean, dry and intact without evidence of skin break down, no evidence of skin tears noted. IV catheter discontinued intact. Site without signs and symptoms of complications. Dressing and pressure applied.  An After Visit Summary was printed and given to the patient. Patient received prescription.  D/c education completed with patient/family including follow up instructions, medication list, d/c activities limitations if indicated, with other d/c instructions as indicated by MD - patient able to verbalize understanding, all questions fully answered.   Patient instructed to return to ED, call 911, or call MD for any changes in condition.   Patient escorted via [place], and D/C home via private auto.  Kathy [name] 02/13/2017 3:04 PM

## 2017-02-13 NOTE — Progress Notes (Signed)
Received report from Freida Busman, [place] in ED for transfer of pt to [phone].

## 2017-02-13 NOTE — ED Notes (Signed)
Admitting Provider at bedside. 

## 2017-02-13 NOTE — Progress Notes (Signed)
Wasted fentanyl 50 mcg (89ml) with Dimas Chyle rn

## 2017-02-13 NOTE — Progress Notes (Signed)
NURSING PROGRESS NOTE  Kathy [name] [id number] Admission Data: 02/13/2017 4:07 AM Attending Provider: Rise Patience, MD [id number], Doretha Sou, MD Code Status: full  Allergies:  Penicillins; Sulfamethoxazole-trimethoprim; Achromycin [tetracycline hcl]; Nickel; and Zanaflex [tizanidine hydrochloride] Past Medical History:   has a past medical history of Adenomatous colon polyp; Anxiety; Anxiety and depression; Arthritis; Depression; Diverticulosis; Fibromyalgia; GERD (gastroesophageal reflux disease); H/O hiatal hernia; Headache(784.0); Heart murmur; History of colonic polyps (08/08/2011); HTN (hypertension); IBS (irritable bowel syndrome); Internal hemorrhoid; Lumbar radiculopathy; Obesity; and Syncope. Past Surgical History:   has a past surgical history that includes Abdominal hysterectomy; Appendectomy; Cholecystectomy; Knee arthroscopy (2008); Colonoscopy (05/27/2002, 08/08/11); Upper gastrointestinal endoscopy (04/21/2009, 08/08/11); Total hip arthroplasty (02/2010); Breast lumpectomy; Total hip arthroplasty (03/18/2012); and Melanoma excision (Left, 07/2013). Social History:   reports that she has never smoked. She has never used smokeless tobacco. She reports that she drinks alcohol. She reports that she does not use drugs.  Kathy [name] is a 66 y.o. female patient admitted from ED:   Last Documented Vital Signs: Blood pressure 134/62, pulse 61, temperature 97.6 F (36.4 C), temperature source Oral, resp. rate 18, SpO2 96 %.  Cardiac Monitoring: Box # 21 in place. Cardiac monitor yields:normal sinus rhythm.  IV Fluids:  IV in place, occlusive dsg intact without redness, IV cath antecubital left, condition no redness none.   Skin: eczema, ecchymosis, hematoma to forehead  Patient/Family orientated to room. Information packet given to patient/family. Admission inpatient armband information verified with patient/family to include name and date of birth and placed on patient  arm. Side rails up x 2, fall assessment and education completed with patient/family. Patient/family able to verbalize understanding of risk associated with falls and verbalized understanding to call for assistance before getting out of bed. Call light within reach. Patient/family able to voice and demonstrate understanding of unit orientation instructions.    Will continue to evaluate and treat per MD orders.   Amaryllis Dyke, RN

## 2017-02-13 NOTE — H&P (Signed)
History and Physical    Kathy [name]:[id number] DOB: [birth year] DOA: 02/12/2017  PCP: Marletta Lor, MD  Patient coming from: Home.  Chief Complaint: Fall.  HPI: Kathy [name] is a 66 y.o. female with history of hypertension, depression, obesity presents to the ER after patient had a fall. Patient states that that was what the weekend home when she slipped and fell. Patient does not do home if she lost consciousness but did hit her head and had sustained a small hematoma on the forehead. After the fall patient has been having persistent low back pain.   ED Course: In the ER patient had CT of the head and neck, abdomen and x-ray of the pelvis lumbar spine which all did not show anything acute. Since patient was hypoxic patient had CT angiogram of the chest which was showing features concerning for possible CHF versus pneumonitis. On my exam patient is not in distress and denies any productive cough. Patient developed significant pain on minimal movement.  Review of Systems: As per HPI, rest all negative.   Past Medical History:  Diagnosis Date  . Adenomatous colon polyp    2012  . Anxiety   . Anxiety and depression   . Arthritis    Osteoarthritis, Dr Maureen Ralphs  . Depression   . Diverticulosis   . Fibromyalgia    Dr Tobie Lords, Rheu  . GERD (gastroesophageal reflux disease)   . H/O hiatal hernia   . Headache(784.0)    PAST HX MILD MIGRAINES  . Heart murmur    mitral valve prolapse--no symptoms  . History of colonic polyps 08/08/2011       . HTN (hypertension)   . IBS (irritable bowel syndrome)   . Internal hemorrhoid   . Lumbar radiculopathy   . Obesity   . Syncope    idiopathic, ? related to migraines or hyperventilation. Dr Leonie Man, Neurology,  ONE TIME EPISODE AND THIS WAS YEARS AGO    Past Surgical History:  Procedure Laterality Date  . ABDOMINAL HYSTERECTOMY     dysfunctional menses  . APPENDECTOMY    . BREAST LUMPECTOMY     right -BENIGN    . CHOLECYSTECTOMY    . COLONOSCOPY  05/27/2002, 08/08/11   2003 diverticulosis, hemorrhoids 2012 same + cecal polyp  . KNEE ARTHROSCOPY  2008   right  . MELANOMA EXCISION Left 07/2013   foot  . TOTAL HIP ARTHROPLASTY  02/2010   left ; Dr Maureen Ralphs  . TOTAL HIP ARTHROPLASTY  03/18/2012   Procedure: TOTAL HIP ARTHROPLASTY;  Surgeon: Gearlean Alf, MD;  Location: [facility];  Service: Orthopedics;  Laterality: Right;  . UPPER GASTROINTESTINAL ENDOSCOPY  04/21/2009, 08/08/11   2010 gastritis ;2012 small hiatal hernia. Dr Carlean Purl     reports that she has never smoked. She has never used smokeless tobacco. She reports that she drinks alcohol. She reports that she does not use drugs.  Allergies  Allergen Reactions  . Penicillins     ? Facial angioedema  . Sulfamethoxazole-Trimethoprim     hives  . Achromycin [Tetracycline Hcl]     Dyspnea    . Nickel   . Zanaflex [Tizanidine Hydrochloride]     Causes more pain     Family History  Problem Relation Age of Onset  . Heart attack Mother 28  . COPD Mother   . Breast cancer Mother   . Hypertension Father   . Diabetes Father   . Heart failure Father 76  . Breast  cancer Paternal Aunt        cns mets  . COPD Maternal Grandmother   . Heart disease Paternal Grandmother        died 24  . Diabetes Paternal Grandmother   . Stroke Paternal Grandfather        in late 90s  . Heart attack Brother 70  . Colon cancer Neg Hx     Prior to Admission medications   Medication Sig Start Date End Date Taking? Authorizing Provider  ALPRAZolam [name]) 1 MG tablet take 1 tablet by mouth four times a day Patient taking differently: take 1mg  by mouth four times a day as needed for anxiety 01/22/17  Yes Marletta Lor, MD  aspirin 325 MG tablet Take 325 mg by mouth daily.   Yes [provider]  BENICAR 40 MG tablet take 1 tablet by mouth once daily Patient taking differently: take 1 tablet (40mg ) by mouth once daily 04/13/16  Yes Marletta Lor, MD  CALCIUM PO Take 1 capsule by mouth daily.   Yes [provider]  clobetasol cream (TEMOVATE) 1.61 % Apply 1 application topically daily.  09/25/13  Yes [provider]  cyclobenzaprine (FLEXERIL) 10 MG tablet Take 1 tablet (10 mg total) by mouth 4 (four) times daily as needed. SPASMS 04/09/15  Yes Marletta Lor, MD  cycloSPORINE (RESTASIS) 0.05 % ophthalmic emulsion Place 1 drop into both eyes 2 (two) times daily.   Yes [provider]  famotidine-calcium carbonate-magnesium hydroxide (PEPCID COMPLETE) 10-800-165 MG CHEW chewable tablet 1 -2 tablets as needed to prevent or treat heartburn Patient taking differently: Chew 1-2 tablets by mouth daily as needed (heartburn).  08/20/13  Yes Gatha Mayer, MD  FLUoxetine (PROZAC) 40 MG capsule take 1 capsule by mouth once daily Patient taking differently: take 1 capsule (40mg ) by mouth every evening 01/09/17  Yes Marletta Lor, MD  fluticasone [facility]) 50 MCG/ACT nasal spray Place 2 sprays into both nostrils daily. 07/31/16  Yes Marletta Lor, MD  ibuprofen (ADVIL,MOTRIN) 800 MG tablet Take 800 mg by mouth every 8 (eight) hours as needed. pain   Yes [provider]  meclizine (ANTIVERT) 25 MG tablet Take 1 tablet (25 mg total) by mouth 3 (three) times daily as needed for dizziness. 04/09/15  Yes Marletta Lor, MD  metoprolol succinate (TOPROL-XL) 100 MG 24 hr tablet take 1 tablet by mouth once daily take WITH OR IMMEDIATELY FOLLOWING A MEAL Patient taking differently: take 1 tablet (100mg ) by mouth once daily take WITH OR IMMEDIATELY FOLLOWING A MEAL 06/14/16  Yes Marletta Lor, MD  Multiple Vitamin (MULTIVITAMIN) tablet Take 1 tablet by mouth daily.   Yes [provider]  omeprazole (PRILOSEC) 40 MG capsule take 1 capsule by mouth once daily 04/13/16  Yes Marletta Lor, MD    Physical Exam: Vitals:   02/13/17 0015 02/13/17 0030 02/13/17 0100 02/13/17 0145    BP: (!) 124/94 129/62 140/67   Pulse: 63 64 64 63  Resp: 13 13 10 18   Temp:      TempSrc:      SpO2: 95% 98% 94% 95%      Constitutional: Moderately built and nourished. Vitals:   02/13/17 0015 02/13/17 0030 02/13/17 0100 02/13/17 0145  BP: (!) 124/94 129/62 140/67   Pulse: 63 64 64 63  Resp: 13 13 10 18   Temp:      TempSrc:      SpO2: 95% 98% 94% 95%  Eyes: Anicteric no pallor. ENMT: Small right frontal hematoma on the scalp. Neck: No neck rigidity no mass felt. Respiratory: No rhonchi or crepitations. Cardiovascular: S1-S2 heard no murmurs appreciated. Abdomen: Soft nontender bowel sounds present. Musculoskeletal: No edema. No joint effusion. Skin: Scalp hematoma. Neurologic: Alert awake oriented to time place and person. Moves all extremities. Psychiatric: Appears normal. Normal affect.   Labs on Admission: I have personally reviewed following labs and imaging studies  CBC:  Recent Labs Lab 02/07/17 02/12/17 1913  WBC 5.4 5.2  NEUTROABS 4 3.7  HGB 14.8 13.7  HCT 45 42.5  MCV  --  95.7  PLT 214 409   Basic Metabolic Panel:  Recent Labs Lab 02/07/17 02/12/17 1913  NA 140 135  K 4.4 3.9  CL  --  99*  CO2  --  23  GLUCOSE  --  120*  BUN 16 16  CREATININE 0.8 0.90  CALCIUM  --  8.5*   GFR: CrCl cannot be calculated (Unknown ideal weight.). Liver Function Tests:  Recent Labs Lab 02/07/17 02/12/17 1913  AST 41* 44*  ALT 54* 47  ALKPHOS 89 80  BILITOT  --  0.7  PROT  --  6.2*  ALBUMIN  --  3.8   No results for input(s): LIPASE, AMYLASE in the last 168 hours. No results for input(s): AMMONIA in the last 168 hours. Coagulation Profile: No results for input(s): INR, PROTIME in the last 168 hours. Cardiac Enzymes: No results for input(s): CKTOTAL, CKMB, CKMBINDEX, TROPONINI in the last 168 hours. BNP (last 3 results) No results for input(s): PROBNP in the last 8760 hours. HbA1C: No results for input(s): HGBA1C in the last 72  hours. CBG: No results for input(s): GLUCAP in the last 168 hours. Lipid Profile: No results for input(s): CHOL, HDL, LDLCALC, TRIG, CHOLHDL, LDLDIRECT in the last 72 hours. Thyroid Function Tests: No results for input(s): TSH, T4TOTAL, FREET4, T3FREE, THYROIDAB in the last 72 hours. Anemia Panel: No results for input(s): VITAMINB12, FOLATE, FERRITIN, TIBC, IRON, RETICCTPCT in the last 72 hours. Urine analysis:    Component Value Date/Time   COLORURINE YELLOW 03/28/2012 1649   APPEARANCEUR CLOUDY (A) 03/28/2012 1649   LABSPEC 1.014 03/28/2012 1649   PHURINE 6.0 03/28/2012 1649   GLUCOSEU NEGATIVE 03/28/2012 1649   HGBUR NEGATIVE 03/28/2012 1649   BILIRUBINUR 1+ 04/06/2016 0856   KETONESUR NEGATIVE 03/28/2012 1649   PROTEINUR 1+ 04/06/2016 0856   PROTEINUR NEGATIVE 03/28/2012 1649   UROBILINOGEN 1.0 04/06/2016 0856   UROBILINOGEN 1.0 03/28/2012 1649   NITRITE n 04/06/2016 0856   NITRITE NEGATIVE 03/28/2012 1649   LEUKOCYTESUR Negative 04/06/2016 0856   Sepsis Labs: @LABRCNTIP (procalcitonin:4,lacticidven:4) )No results found for this or any previous visit (from the past 240 hour(s)).   Radiological Exams on Admission: Dg Lumbar Spine Complete  Result Date: 02/12/2017 CLINICAL DATA:  Fall.  Low back pain. EXAM: LUMBAR SPINE - COMPLETE 4+ VIEW COMPARISON:  03/28/2012. FINDINGS: There is no evidence of lumbar spine fracture. Alignment is normal except for trace facet mediated anterolisthesis L4-5. Intervertebral disc spaces are grossly maintained, slight narrowing L5-S1. IMPRESSION: No posttraumatic sequelae are evident. Similar appearance to priors. Electronically Signed   By: Staci Righter M.D.   On: 02/12/2017 20:15   Ct Head Wo Contrast  Result Date: 02/12/2017 CLINICAL DATA:  Neck pain after fall. EXAM: CT HEAD WITHOUT CONTRAST CT CERVICAL SPINE WITHOUT CONTRAST TECHNIQUE: Multidetector CT imaging of the head and cervical spine was performed following the standard protocol  without intravenous  contrast. Multiplanar CT image reconstructions of the cervical spine were also generated. COMPARISON:  CT scan of March 28, 2012. FINDINGS: CT HEAD FINDINGS Brain: No evidence of acute infarction, hemorrhage, hydrocephalus, extra-axial collection or mass lesion/mass effect. Vascular: No hyperdense vessel or unexpected calcification. Skull: Normal. Negative for fracture or focal lesion. Sinuses/Orbits: No acute finding. Other: Small right frontal scalp hematoma is noted. CT CERVICAL SPINE FINDINGS Alignment: Normal. Skull base and vertebrae: No acute fracture. No primary bone lesion or focal pathologic process. Soft tissues and spinal canal: No prevertebral fluid or swelling. No visible canal hematoma. Disc levels: Moderate degenerative disc disease is noted at C4-5, C5-6 and C6-7 with anterior osteophyte formation. Upper chest: Negative. Other: None. IMPRESSION: Small right frontal scalp hematoma. No acute intracranial abnormality seen. Multilevel degenerative disc disease. No acute abnormality seen in the cervical spine. Electronically Signed   By: Marijo Conception, M.D.   On: 02/12/2017 20:17   Ct Angio Chest Pe W Or Wo Contrast  Result Date: 02/12/2017 CLINICAL DATA:  Hypoxia EXAM: CT ANGIOGRAPHY CHEST WITH CONTRAST TECHNIQUE: Multidetector CT imaging of the chest was performed using the standard protocol during bolus administration of intravenous contrast. Multiplanar CT image reconstructions and MIPs were obtained to evaluate the vascular anatomy. CONTRAST:  100 cc Isovue 370 IV COMPARISON:  09/08/2004 chest CT, CXR from earlier the same day FINDINGS: Cardiovascular: Stable cardiomegaly without pericardial effusion. No large central pulmonary embolus. Aortic atherosclerosis without aneurysm. Mediastinum/Nodes: No enlarged mediastinal, hilar, or axillary lymph nodes. Thyroid gland, trachea, and esophagus demonstrate no significant findings.All Lungs/Pleura: Patchy ground-glass opacities  bilaterally with bibasilar dependent atelectasis. Findings may represent mild hypoventilatory change. Other possibilities may include an alveolitis or pneumonitis or sequela mild CHF. 7 mm nodular density along the diaphragmatic pleura at the right lung base is not apparent on current exam. No new mass, pneumonic consolidation, effusion or pneumothorax. Upper Abdomen: No acute abnormality. 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 No acute nor suspicious osseous lesions. Review of the MIP images confirms the above findings. IMPRESSION: Cardiomegaly with aortic atherosclerosis. No acute pulmonary embolus. Patchy ground-glass opacities are noted bilaterally which may represent hypoventilatory change, alveolitis/ pneumonitis or sequela mild CHF. Electronically Signed   By: Ashley Royalty M.D.   On: 02/12/2017 23:55   Ct Cervical Spine Wo Contrast  Result Date: 02/12/2017 CLINICAL DATA:  Neck pain after fall. EXAM: CT HEAD WITHOUT CONTRAST CT CERVICAL SPINE WITHOUT CONTRAST TECHNIQUE: Multidetector CT imaging of the head and cervical spine was performed following the standard protocol without intravenous contrast. Multiplanar CT image reconstructions of the cervical spine were also generated. COMPARISON:  CT scan of March 28, 2012. FINDINGS: CT HEAD FINDINGS Brain: No evidence of acute infarction, hemorrhage, hydrocephalus, extra-axial collection or mass lesion/mass effect. Vascular: No hyperdense vessel or unexpected calcification. Skull: Normal. Negative for fracture or focal lesion. Sinuses/Orbits: No acute finding. Other: Small right frontal scalp hematoma is noted. CT CERVICAL SPINE FINDINGS Alignment: Normal. Skull base and vertebrae: No acute fracture. No primary bone lesion or focal pathologic process. Soft tissues and spinal canal: No prevertebral fluid or swelling. No visible canal hematoma. Disc levels: Moderate degenerative disc disease is noted at C4-5, C5-6 and C6-7  with anterior osteophyte formation. Upper chest: Negative. Other: None. IMPRESSION: Small right frontal scalp hematoma. No acute intracranial abnormality seen. Multilevel degenerative disc disease. No acute abnormality seen in the cervical spine. Electronically Signed   By: Marijo Conception, M.D.   On: 02/12/2017 20:17  Ct Abdomen Pelvis W Contrast  Result Date: 02/12/2017 CLINICAL DATA:  66 year old female with abdominal pain, and diarrhea. Fall. EXAM: CT ABDOMEN AND PELVIS WITH CONTRAST TECHNIQUE: Multidetector CT imaging of the abdomen and pelvis was performed using the standard protocol following bolus administration of intravenous contrast. CONTRAST:  100 cc Isovue 370 5 g COMPARISON:  None. FINDINGS: Lower chest: Minimal bibasilar dependent atelectatic changes of the lungs. The visualized lung bases are otherwise clear. No intra-abdominal free air or free fluid. Hepatobiliary: Cholecystectomy. No intrahepatic biliary ductal dilatation. Pancreas: Unremarkable. No pancreatic ductal dilatation or surrounding inflammatory changes. Spleen: Normal in size without focal abnormality. Adrenals/Urinary Tract: The adrenal glands are unremarkable. There is a 2.3 cm left renal cyst. The kidneys are otherwise unremarkable. The visualized ureters and urinary bladder appear unremarkable. Stomach/Bowel: There is extensive sigmoid diverticulosis with muscular hypertrophy. No active inflammatory changes. There is no evidence of bowel obstruction or active inflammation. Appendectomy. Vascular/Lymphatic: There is mild aortoiliac atherosclerotic disease. The IVC appears unremarkable. No portal venous gas. There is no adenopathy. Reproductive: Hysterectomy. Other: None Musculoskeletal: Bilateral hip arthroplasty. Degenerative changes of the spine. No acute fracture. IMPRESSION: 1. No acute intra-abdominopelvic pathology. 2. Sigmoid diverticulosis. No bowel obstruction or active inflammation. 3. Hysterectomy and appendectomy. 4.  Bilateral total hip arthroplasties. 5. Atherosclerotic aorta. Electronically Signed   By: Anner Crete M.D.   On: 02/12/2017 23:40   Dg Pelvis Portable  Result Date: 02/12/2017 CLINICAL DATA:  Pelvic pain after fall at home. EXAM: PORTABLE PELVIS 1-2 VIEWS COMPARISON:  None. FINDINGS: There is no evidence of pelvic fracture or diastasis. No pelvic bone lesions are seen. Status post bilateral total hip arthroplasties. IMPRESSION: No acute abnormality seen in the pelvis. Electronically Signed   By: Marijo Conception, M.D.   On: 02/12/2017 19:23   Ct L-spine No Charge  Result Date: 02/13/2017 CLINICAL DATA:  Fall.  History of lumbar radiculopathy. EXAM: CT LUMBAR SPINE WITHOUT CONTRAST TECHNIQUE: Reconstructed CT imaging of the lumbar spine were performed without intravenous contrast administration. Multiplanar CT image reconstructions were also generated. COMPARISON:  None. FINDINGS: SEGMENTATION: For the purposes of this report the last well-formed intervertebral disc space is reported as L5-S1. ALIGNMENT: Maintained lumbar lordosis. No malalignment. VERTEBRAE: Vertebral bodies and posterior elements are intact. Mild L5-S1 disc height loss most consistent with degenerative disc. No destructive bony lesions. PARASPINAL AND OTHER SOFT TISSUES: Nonsuspicious, please see CT of abdomen and pelvis from same day, reported separately for dedicated findings. DISC LEVELS: T12-L1, L1-2: No disc bulge, canal stenosis nor neural foraminal narrowing. L2-3: Small broad-based disc bulge. No canal stenosis or neural foraminal narrowing. L3-4: Small broad-based disc bulge. Mild LEFT facet arthropathy without canal stenosis or neural foraminal narrowing. L4-5: Annular bulging. Moderate facet arthropathy and ligamentum flavum redundancy without canal stenosis or neural foraminal narrowing. L5-S1: Small broad-based disc osteophyte complex. Moderate facet arthropathy without canal stenosis. Mild LEFT neural foraminal narrowing.  IMPRESSION: No acute fracture or malalignment. Degenerative change of the lumbar spine. No canal stenosis. Mild LEFT L5-S1 neural foraminal narrowing. Electronically Signed   By: Elon Alas M.D.   On: 02/13/2017 00:59   Dg Chest Port 1 View  Result Date: 02/12/2017 CLINICAL DATA:  Fall in kitchen. EXAM: PORTABLE CHEST 1 VIEW COMPARISON:  Radiographs of March 11, 2012. FINDINGS: Stable cardiomediastinal silhouette. No pneumothorax or pleural effusion is noted. Both lungs are clear. The visualized skeletal structures are unremarkable. IMPRESSION: No acute cardiopulmonary abnormality seen. Electronically Signed   By: Marijo Conception,  M.D.   On: 02/12/2017 19:22    EKG: Independently reviewed. Normal sinus rhythm.  Assessment/Plan Principal Problem:   Low back pain Active Problems:   Obesity   Essential hypertension   OA (osteoarthritis) of hip   Difficulty walking    1. Low back pain after mechanical fall - will keep patient on pain relief medications. Get physical therapy consult. 2. Hypoxia - cause not clear. I have placed patient on incentive centimeter and when necessary albuterol. Since patient has mild cardiomegaly will check BNP and 2-D echo. 3. Hypertension - continue metoprolol and ARB.  Plan since patient drinks alcohol 4-5 days in a week I have placed patient on CIWA protocol.   DVT prophylaxis: SCDs. Code Status: Full code.  Family Communication: Discussed with patient.  Disposition Plan: To be determined.  Consults called: Physical therapy.  Admission status: Observation.    Rise Patience MD [facility] Pager [phone].  If 7PM-7AM, please contact night-coverage [email or web address] Password [email or web address]  02/13/2017, 2:11 AM

## 2017-02-13 NOTE — Discharge Summary (Signed)
Physician Discharge Summary  Kathy [name] YIF:[id number] DOB: 28-Apr-1951 DOA: 02/12/2017  PCP: Marletta Lor, MD  Admit date: 02/12/2017 Discharge date: 02/13/2017  Time spent: 25 minutes  Recommendations for Outpatient Follow-up:  1. Follow-up with primary care physician in one week  Discharge Diagnoses:  Principal Problem:   Low back pain Active Problems:   Obesity   Essential hypertension   OA (osteoarthritis) of hip   Difficulty walking   Discharge Condition: Fair  Diet recommendation: Diabetic heart healthy  Filed Weights   02/13/17 0504  Weight: 136 kg (299 lb 13.2 oz)    History of present illness:  [age] female with history of GERD depression and hypertension prior hip replacements had a mechanical fall without dizziness and or syncope on 5/28 Was admitted transiently overnight and status 5/29 able to ambulate with more strength and less pain and physical therapy evaluated patient and felt patient was stable for discharge Will need outpatient management of her chronic low back pains and suggest de-escalation/lower dosing of Flexeril    Discharge Exam: Vitals:   02/13/17 0504 02/13/17 1003  BP: 120/60 (!) 111/55  Pulse: 60 67  Resp: 18   Temp: 98.1 F (36.7 C) 98.1 F (36.7 C)    General: EOMI NCAT Cardiovascular: S1-S2 no murmur rub or gallop Respiratory: Clinically clear no added sound Obesity Significant loss of lumbar lordosis  Discharge Instructions   Discharge Instructions    Diet - low sodium heart healthy    Complete by:  As directed    Discharge instructions    Complete by:  As directed    Use Tylenol 1000 mg 3 times a day for back pain Please ambulate slowly and carefully Follow-up with her primary physician in one week.   Increase activity slowly    Complete by:  As directed      Current Discharge Medication List    CONTINUE these medications which have NOT CHANGED   Details  ALPRAZolam (XANAX) 1 MG tablet take 1  tablet by mouth four times a day Qty: 60 tablet, Refills: 2    aspirin 325 MG tablet Take 325 mg by mouth daily.    BENICAR 40 MG tablet take 1 tablet by mouth once daily Qty: 60 tablet, Refills: 5    CALCIUM PO Take 1 capsule by mouth daily.    clobetasol cream (TEMOVATE) 8.67 % Apply 1 application topically daily.     cyclobenzaprine (FLEXERIL) 10 MG tablet Take 1 tablet (10 mg total) by mouth 4 (four) times daily as needed. SPASMS Qty: 30 tablet, Refills: 5    cycloSPORINE (RESTASIS) 0.05 % ophthalmic emulsion Place 1 drop into both eyes 2 (two) times daily.    famotidine-calcium carbonate-magnesium hydroxide (PEPCID COMPLETE) 10-800-165 MG CHEW chewable tablet 1 -2 tablets as needed to prevent or treat heartburn    FLUoxetine (PROZAC) 40 MG capsule take 1 capsule by mouth once daily Qty: 90 capsule, Refills: 2    fluticasone (FLONASE) 50 MCG/ACT nasal spray Place 2 sprays into both nostrils daily. Qty: 16 g, Refills: 6    ibuprofen (ADVIL,MOTRIN) 800 MG tablet Take 800 mg by mouth every 8 (eight) hours as needed. pain    meclizine (ANTIVERT) 25 MG tablet Take 1 tablet (25 mg total) by mouth 3 (three) times daily as needed for dizziness. Qty: 30 tablet, Refills: 3    metoprolol succinate (TOPROL-XL) 100 MG 24 hr tablet take 1 tablet by mouth once daily take WITH OR IMMEDIATELY FOLLOWING A MEAL Qty:  60 tablet, Refills: 5    Multiple Vitamin (MULTIVITAMIN) tablet Take 1 tablet by mouth daily.    omeprazole (PRILOSEC) 40 MG capsule take 1 capsule by mouth once daily Qty: 60 capsule, Refills: 5       Allergies  Allergen Reactions  . Penicillins     ? Facial angioedema  . Sulfamethoxazole-Trimethoprim     hives  . Achromycin [Tetracycline Hcl]     Dyspnea    . Nickel   . Zanaflex [Tizanidine Hydrochloride]     Causes more pain       The results of significant diagnostics from this hospitalization (including imaging, microbiology, ancillary and laboratory) are  listed below for reference.    Significant Diagnostic Studies: Dg Lumbar Spine Complete  Result Date: 02/12/2017 CLINICAL DATA:  Fall.  Low back pain. EXAM: LUMBAR SPINE - COMPLETE 4+ VIEW COMPARISON:  03/28/2012. FINDINGS: There is no evidence of lumbar spine fracture. Alignment is normal except for trace facet mediated anterolisthesis L4-5. Intervertebral disc spaces are grossly maintained, slight narrowing L5-S1. IMPRESSION: No posttraumatic sequelae are evident. Similar appearance to priors. Electronically Signed   By: Staci Righter M.D.   On: 02/12/2017 20:15   Ct Head Wo Contrast  Result Date: 02/12/2017 CLINICAL DATA:  Neck pain after fall. EXAM: CT HEAD WITHOUT CONTRAST CT CERVICAL SPINE WITHOUT CONTRAST TECHNIQUE: Multidetector CT imaging of the head and cervical spine was performed following the standard protocol without intravenous contrast. Multiplanar CT image reconstructions of the cervical spine were also generated. COMPARISON:  CT scan of March 28, 2012. FINDINGS: CT HEAD FINDINGS Brain: No evidence of acute infarction, hemorrhage, hydrocephalus, extra-axial collection or mass lesion/mass effect. Vascular: No hyperdense vessel or unexpected calcification. Skull: Normal. Negative for fracture or focal lesion. Sinuses/Orbits: No acute finding. Other: Small right frontal scalp hematoma is noted. CT CERVICAL SPINE FINDINGS Alignment: Normal. Skull base and vertebrae: No acute fracture. No primary bone lesion or focal pathologic process. Soft tissues and spinal canal: No prevertebral fluid or swelling. No visible canal hematoma. Disc levels: Moderate degenerative disc disease is noted at C4-5, C5-6 and C6-7 with anterior osteophyte formation. Upper chest: Negative. Other: None. IMPRESSION: Small right frontal scalp hematoma. No acute intracranial abnormality seen. Multilevel degenerative disc disease. No acute abnormality seen in the cervical spine. Electronically Signed   By: Marijo Conception,  M.D.   On: 02/12/2017 20:17   Ct Angio Chest Pe W Or Wo Contrast  Result Date: 02/12/2017 CLINICAL DATA:  Hypoxia EXAM: CT ANGIOGRAPHY CHEST WITH CONTRAST TECHNIQUE: Multidetector CT imaging of the chest was performed using the standard protocol during bolus administration of intravenous contrast. Multiplanar CT image reconstructions and MIPs were obtained to evaluate the vascular anatomy. CONTRAST:  100 cc Isovue 370 IV COMPARISON:  09/08/2004 chest CT, CXR from earlier the same day FINDINGS: Cardiovascular: Stable cardiomegaly without pericardial effusion. No large central pulmonary embolus. Aortic atherosclerosis without aneurysm. Mediastinum/Nodes: No enlarged mediastinal, hilar, or axillary lymph nodes. Thyroid gland, trachea, and esophagus demonstrate no significant findings.All Lungs/Pleura: Patchy ground-glass opacities bilaterally with bibasilar dependent atelectasis. Findings may represent mild hypoventilatory change. Other possibilities may include an alveolitis or pneumonitis or sequela mild CHF. 7 mm nodular density along the diaphragmatic pleura at the right lung base is not apparent on current exam. No new mass, pneumonic consolidation, effusion or pneumothorax. Upper Abdomen: No acute abnormality. 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 No acute nor suspicious osseous lesions. Review of the MIP images confirms the above findings.  IMPRESSION: Cardiomegaly with aortic atherosclerosis. No acute pulmonary embolus. Patchy ground-glass opacities are noted bilaterally which may represent hypoventilatory change, alveolitis/ pneumonitis or sequela mild CHF. Electronically Signed   By: Ashley Royalty M.D.   On: 02/12/2017 23:55   Ct Cervical Spine Wo Contrast  Result Date: 02/12/2017 CLINICAL DATA:  Neck pain after fall. EXAM: CT HEAD WITHOUT CONTRAST CT CERVICAL SPINE WITHOUT CONTRAST TECHNIQUE: Multidetector CT imaging of the head and cervical spine was performed  following the standard protocol without intravenous contrast. Multiplanar CT image reconstructions of the cervical spine were also generated. COMPARISON:  CT scan of March 28, 2012. FINDINGS: CT HEAD FINDINGS Brain: No evidence of acute infarction, hemorrhage, hydrocephalus, extra-axial collection or mass lesion/mass effect. Vascular: No hyperdense vessel or unexpected calcification. Skull: Normal. Negative for fracture or focal lesion. Sinuses/Orbits: No acute finding. Other: Small right frontal scalp hematoma is noted. CT CERVICAL SPINE FINDINGS Alignment: Normal. Skull base and vertebrae: No acute fracture. No primary bone lesion or focal pathologic process. Soft tissues and spinal canal: No prevertebral fluid or swelling. No visible canal hematoma. Disc levels: Moderate degenerative disc disease is noted at C4-5, C5-6 and C6-7 with anterior osteophyte formation. Upper chest: Negative. Other: None. IMPRESSION: Small right frontal scalp hematoma. No acute intracranial abnormality seen. Multilevel degenerative disc disease. No acute abnormality seen in the cervical spine. Electronically Signed   By: Marijo Conception, M.D.   On: 02/12/2017 20:17   Ct Abdomen Pelvis W Contrast  Result Date: 02/12/2017 CLINICAL DATA:  66 year old female with abdominal pain, and diarrhea. Fall. EXAM: CT ABDOMEN AND PELVIS WITH CONTRAST TECHNIQUE: Multidetector CT imaging of the abdomen and pelvis was performed using the standard protocol following bolus administration of intravenous contrast. CONTRAST:  100 cc Isovue 370 5 g COMPARISON:  None. FINDINGS: Lower chest: Minimal bibasilar dependent atelectatic changes of the lungs. The visualized lung bases are otherwise clear. No intra-abdominal free air or free fluid. Hepatobiliary: Cholecystectomy. No intrahepatic biliary ductal dilatation. Pancreas: Unremarkable. No pancreatic ductal dilatation or surrounding inflammatory changes. Spleen: Normal in size without focal abnormality.  Adrenals/Urinary Tract: The adrenal glands are unremarkable. There is a 2.3 cm left renal cyst. The kidneys are otherwise unremarkable. The visualized ureters and urinary bladder appear unremarkable. Stomach/Bowel: There is extensive sigmoid diverticulosis with muscular hypertrophy. No active inflammatory changes. There is no evidence of bowel obstruction or active inflammation. Appendectomy. Vascular/Lymphatic: There is mild aortoiliac atherosclerotic disease. The IVC appears unremarkable. No portal venous gas. There is no adenopathy. Reproductive: Hysterectomy. Other: None Musculoskeletal: Bilateral hip arthroplasty. Degenerative changes of the spine. No acute fracture. IMPRESSION: 1. No acute intra-abdominopelvic pathology. 2. Sigmoid diverticulosis. No bowel obstruction or active inflammation. 3. Hysterectomy and appendectomy. 4. Bilateral total hip arthroplasties. 5. Atherosclerotic aorta. Electronically Signed   By: Anner Crete M.D.   On: 02/12/2017 23:40   Dg Pelvis Portable  Result Date: 02/12/2017 CLINICAL DATA:  Pelvic pain after fall at home. EXAM: PORTABLE PELVIS 1-2 VIEWS COMPARISON:  None. FINDINGS: There is no evidence of pelvic fracture or diastasis. No pelvic bone lesions are seen. Status post bilateral total hip arthroplasties. IMPRESSION: No acute abnormality seen in the pelvis. Electronically Signed   By: Marijo Conception, M.D.   On: 02/12/2017 19:23   Ct L-spine No Charge  Result Date: 02/13/2017 CLINICAL DATA:  Fall.  History of lumbar radiculopathy. EXAM: CT LUMBAR SPINE WITHOUT CONTRAST TECHNIQUE: Reconstructed CT imaging of the lumbar spine were performed without intravenous contrast administration. Multiplanar CT image reconstructions  were also generated. COMPARISON:  None. FINDINGS: SEGMENTATION: For the purposes of this report the last well-formed intervertebral disc space is reported as L5-S1. ALIGNMENT: Maintained lumbar lordosis. No malalignment. VERTEBRAE: Vertebral bodies  and posterior elements are intact. Mild L5-S1 disc height loss most consistent with degenerative disc. No destructive bony lesions. PARASPINAL AND OTHER SOFT TISSUES: Nonsuspicious, please see CT of abdomen and pelvis from same day, reported separately for dedicated findings. DISC LEVELS: T12-L1, L1-2: No disc bulge, canal stenosis nor neural foraminal narrowing. L2-3: Small broad-based disc bulge. No canal stenosis or neural foraminal narrowing. L3-4: Small broad-based disc bulge. Mild LEFT facet arthropathy without canal stenosis or neural foraminal narrowing. L4-5: Annular bulging. Moderate facet arthropathy and ligamentum flavum redundancy without canal stenosis or neural foraminal narrowing. L5-S1: Small broad-based disc osteophyte complex. Moderate facet arthropathy without canal stenosis. Mild LEFT neural foraminal narrowing. IMPRESSION: No acute fracture or malalignment. Degenerative change of the lumbar spine. No canal stenosis. Mild LEFT L5-S1 neural foraminal narrowing. Electronically Signed   By: Elon Alas M.D.   On: 02/13/2017 00:59   Dg Chest Port 1 View  Result Date: 02/12/2017 CLINICAL DATA:  Fall in kitchen. EXAM: PORTABLE CHEST 1 VIEW COMPARISON:  Radiographs of March 11, 2012. FINDINGS: Stable cardiomediastinal silhouette. No pneumothorax or pleural effusion is noted. Both lungs are clear. The visualized skeletal structures are unremarkable. IMPRESSION: No acute cardiopulmonary abnormality seen. Electronically Signed   By: Marijo Conception, M.D.   On: 02/12/2017 19:22    Microbiology: No results found for this or any previous visit (from the past 240 hour(s)).   Labs: Basic Metabolic Panel:  Recent Labs Lab 02/07/17 02/12/17 1913 02/13/17 0730  NA 140 135 137  K 4.4 3.9 4.2  CL  --  99* 100*  CO2  --  23 27  GLUCOSE  --  120* 123*  BUN 16 16 16   CREATININE 0.8 0.90 0.89  CALCIUM  --  8.5* 8.8*   Liver Function Tests:  Recent Labs Lab 02/07/17 02/12/17 1913  AST  41* 44*  ALT 54* 47  ALKPHOS 89 80  BILITOT  --  0.7  PROT  --  6.2*  ALBUMIN  --  3.8   No results for input(s): LIPASE, AMYLASE in the last 168 hours. No results for input(s): AMMONIA in the last 168 hours. CBC:  Recent Labs Lab 02/07/17 02/12/17 1913 02/13/17 0730  WBC 5.4 5.2 6.1  NEUTROABS 4 3.7  --   HGB 14.8 13.7 13.5  HCT 45 42.5 43.2  MCV  --  95.7 97.7  PLT 214 178 180   Cardiac Enzymes:  Recent Labs Lab 02/13/17 0310 02/13/17 0730  TROPONINI <0.03 <0.03   BNP: BNP (last 3 results)  Recent Labs  02/13/17 0310  BNP 177.3*    ProBNP (last 3 results) No results for input(s): PROBNP in the last 8760 hours.  CBG: No results for input(s): GLUCAP in the last 168 hours.     SignedNita Sells MD   [facility] 02/13/2017, 12:54 PM

## 2017-02-13 NOTE — Evaluation (Signed)
Physical Therapy Evaluation Patient Details Name: Kathy [name] MRN: [id number] DOB: 04-14-1951 Today's Date: 02/13/2017   History of Present Illness  Kathy [name] is a 66 y.o. female hx of GERD, depression, HTN, previous hip replacement, presenting after mechanical fall with resultant low back and "tailbone" pain.  CT showed no acute fx or abnormality.  Clinical Impression  Pt is close to baseline functioning with pain that will limit activity in the short term.  Pt should be safe at home with assist from husband and family. There are no further acute PT needs.  Will sign off at this time.     Follow Up Recommendations No PT follow up    Equipment Recommendations  None recommended by PT    Recommendations for Other Services       Precautions / Restrictions Precautions Precautions: Fall Restrictions Weight Bearing Restrictions: No      Mobility  Bed Mobility Overal bed mobility: Needs Assistance Bed Mobility: Sidelying to Sit   Sidelying to sit: Min assist       General bed mobility comments: pt tried to come straight up with increased pain.  Cued to roll to R side up onto elbow to come up.  In lieu of rail, pt needed minimal assist  Transfers Overall transfer level: Needs assistance Equipment used: Rolling walker (2 wheeled) Transfers: Sit to/from Stand Sit to Stand: Min guard         General transfer comment: cues for safest technique  Ambulation/Gait Ambulation/Gait assistance: Min guard Ambulation Distance (Feet): 90 Feet Assistive device: Rolling walker (2 wheeled) Gait Pattern/deviations: Step-through pattern;Decreased step length - right;Decreased step length - left;Decreased stride length   Gait velocity interpretation: Below normal speed for age/gender General Gait Details: mildly unsteady with heavy use fo the RW due to back/coccyx pain and decreased confidence.  Stairs            Wheelchair Mobility    Modified Rankin (Stroke  Patients Only)       Balance Overall balance assessment: Needs assistance   Sitting balance-Leahy Scale: Good     Standing balance support: Bilateral upper extremity supported;No upper extremity supported Standing balance-Leahy Scale: Fair Standing balance comment: static without AD                             Pertinent Vitals/Pain Pain Assessment: 0-10 Pain Score: 6  Pain Location: tailbone Pain Descriptors / Indicators: Aching;Grimacing;Guarding Pain Intervention(s): Monitored during session    Home Living Family/patient expects to be discharged to:: Private residence Living Arrangements: Spouse/significant other Available Help at Discharge: Family;Available PRN/intermittently Type of Home: House Home Access: Stairs to enter Entrance Stairs-Rails: Psychiatric nurse of Steps: 2 Home Layout: One level Home Equipment: Walker - 2 wheels;Shower seat - built in      Prior Function Level of Independence: Independent               Journalist, newspaper        Extremity/Trunk Assessment   Upper Extremity Assessment Upper Extremity Assessment: Overall WFL for tasks assessed    Lower Extremity Assessment Lower Extremity Assessment: Overall WFL for tasks assessed (limited mildly by the pain)       Communication   Communication: No difficulties  Cognition Arousal/Alertness: Awake/alert Behavior During Therapy: WFL for tasks assessed/performed Overall Cognitive Status: Within Functional Limits for tasks assessed  General Comments      Exercises     Assessment/Plan    PT Assessment Patent does not need any further PT services  PT Problem List Pain       PT Treatment Interventions      PT Goals (Current goals can be found in the Care Plan section)  Acute Rehab PT Goals PT Goal Formulation: All assessment and education complete, DC therapy    Frequency     Barriers to  discharge        Co-evaluation               AM-PAC PT "6 Clicks" Daily Activity  Outcome Measure Difficulty turning over in bed (including adjusting bedclothes, sheets and blankets)?: Total Difficulty moving from lying on back to sitting on the side of the bed? : Total Difficulty sitting down on and standing up from a chair with arms (e.g., wheelchair, bedside commode, etc,.)?: A Little Help needed moving to and from a bed to chair (including a wheelchair)?: A Little Help needed walking in hospital room?: A Little Help needed climbing 3-5 steps with a railing? : A Little 6 Click Score: 14    End of Session   Activity Tolerance: Patient limited by pain;Patient limited by fatigue;Patient tolerated treatment well Patient left: in chair;with call bell/phone within reach;with chair alarm set;with family/visitor present Nurse Communication: Mobility status PT Visit Diagnosis: Other abnormalities of gait and mobility (R26.89);Pain Pain - part of body:  (back and coccyx pain)    Time: 1202-1223 PT Time Calculation (min) (ACUTE ONLY): 21 min   Charges:   PT Evaluation $PT Eval Moderate Complexity: 1 Procedure     PT G Codes:   PT G-Codes **NOT FOR INPATIENT CLASS** Functional Assessment Tool Used: AM-PAC 6 Clicks Basic Mobility;Clinical judgement Functional Limitation: Mobility: Walking and moving around Mobility: Walking and Moving Around Current Status (M2500): At least 1 percent but less than 20 percent impaired, limited or restricted Mobility: Walking and Moving Around Goal Status [phone]): At least 1 percent but less than 20 percent impaired, limited or restricted Mobility: Walking and Moving Around Discharge Status [phone]): At least 1 percent but less than 20 percent impaired, limited or restricted    02/13/2017  Donnella Sham, PT [phone]  (pager)  Tessie Fass E[place]han [place] 02/13/2017, 12:41 PM

## 2017-02-13 NOTE — ED Notes (Signed)
Attempted to call report

## 2017-02-14 ENCOUNTER — Telehealth: Payer: Self-pay

## 2017-02-14 NOTE — Telephone Encounter (Signed)
D/C 02/13/17 To: home  Spoke with pt and she is feeling more sore and achy today. She is able to ambulate well with assistance of walker. She does not need assistance with any ADL's. She is taking Advil and Flexeril as needed for pain and muscle aches. She denies any additional concussion s/s. No other complaints or concerns at this time.   Appt scheduled with Dr. Maudie Mercury as Dr. Raliegh Ip is not in office next week, pt aware.    Transition Care Management Follow-up Telephone Call  How have you been since you were released from the hospital? Very sore and aching, no headache/blurry vision/n/v   Do you understand why you were in the hospital? yes   Do you understand the discharge instrcutions? yes  Items Reviewed:  Medications reviewed: yes  Allergies reviewed: yes  Dietary changes reviewed: yes  Referrals reviewed: yes   Functional Questionnaire:   Activities of Daily Living (ADLs):   She states they are independent in the following: ambulation, bathing and hygiene, feeding, continence, grooming, toileting and dressing States they require assistance with the following: none   Any transportation issues/concerns?: no   Any patient concerns? no   Confirmed importance and date/time of follow-up visits scheduled: yes   Confirmed with patient if condition begins to worsen call PCP or go to the ER.  Patient was given the Call-a-Nurse line [phone]: yes

## 2017-02-14 NOTE — Telephone Encounter (Signed)
LMTCB

## 2017-02-15 NOTE — Progress Notes (Signed)
HPI:  Transitional care visit - PCP not available today. See phone note. Hospitalized 5/28-/02/13/17 for low back pain following mechanical fall w/out dizziness or syncope per review notes. Eval with PT and sent home for outpt PT w/ flexeril and tylenol for pain. CT head, L spine, C spine and abd and plainfilms chest, low back and pelvis! No sig acute findings. Does have DDD, cardiomegaly (see below), atherosclerosis aorta. Scalp hematoma. Complicated by possible hypoxia on arrival? Can't find O2 sats and no mention on discharge document. Mentioned on H and P and had CT angio chest with ? Mild pneumonitis or mild CHF, very mildly elevated BMP, trops neg x3 and EKG without acute changes. She reports doing well since home with significant improvement in pain. Still some soreness in back. Had a little tingling in R leg now improving. Hx of same. Reports pain controlled with occ use flexeril and OTC analgesic (used anyway at baseline). No weakness, radiation pain, bowel or bladder incontinence. Sees ortho for known DDD and rheumatologist for fibro and OA. Denies any SOB, fevers, discolored sputum, chest pain, swelling or palpitations. Reports hx of bad allergies and sometimes use of alb inhaler in the past this time of year as sometimes gets some mild SOB with her allergies. Has had some cough (clear sputum) and nasal congestion the last few weeks. Reports she was given nebs in the ER and this helped th hypoxia and her SOB mentioned above.   ROS: See pertinent positives and negatives per HPI.  Past Medical History:  Diagnosis Date  . Adenomatous colon polyp    2012  . Anxiety   . Anxiety and depression   . Arthritis    Osteoarthritis, Dr Maureen Ralphs  . Depression   . Diverticulosis   . Fibromyalgia    Dr Tobie Lords, Rheu  . GERD (gastroesophageal reflux disease)   . H/O hiatal hernia   . Headache(784.0)    PAST HX MILD MIGRAINES  . Heart murmur    mitral valve prolapse--no symptoms  . History  of colonic polyps 08/08/2011       . HTN (hypertension)   . IBS (irritable bowel syndrome)   . Internal hemorrhoid   . Lumbar radiculopathy   . Obesity   . Syncope    idiopathic, ? related to migraines or hyperventilation. Dr Leonie Man, Neurology,  ONE TIME EPISODE AND THIS WAS YEARS AGO    Past Surgical History:  Procedure Laterality Date  . ABDOMINAL HYSTERECTOMY     dysfunctional menses  . APPENDECTOMY    . BREAST LUMPECTOMY     right -BENIGN  . CHOLECYSTECTOMY    . COLONOSCOPY  05/27/2002, 08/08/11   2003 diverticulosis, hemorrhoids 2012 same + cecal polyp  . KNEE ARTHROSCOPY  2008   right  . MELANOMA EXCISION Left 07/2013   foot  . TOTAL HIP ARTHROPLASTY  02/2010   left ; Dr Maureen Ralphs  . TOTAL HIP ARTHROPLASTY  03/18/2012   Procedure: TOTAL HIP ARTHROPLASTY;  Surgeon: Gearlean Alf, MD;  Location: [facility];  Service: Orthopedics;  Laterality: Right;  . UPPER GASTROINTESTINAL ENDOSCOPY  04/21/2009, 08/08/11   2010 gastritis ;2012 small hiatal hernia. Dr Carlean Purl    Family History  Problem Relation Age of Onset  . Heart attack Mother 38  . COPD Mother   . Breast cancer Mother   . Hypertension Father   . Diabetes Father   . Heart failure Father 23  . Breast cancer Paternal Aunt  cns mets  . COPD Maternal Grandmother   . Heart disease Paternal Grandmother        died 63  . Diabetes Paternal Grandmother   . Stroke Paternal Grandfather        in late 53s  . Heart attack Brother 43  . Colon cancer Neg Hx     Social History   Social History  . Marital status: Married    Spouse name: N/A  . Number of children: N/A  . Years of education: N/A   Social History Main Topics  . Smoking status: Never Smoker  . Smokeless tobacco: Never Used  . Alcohol use Yes     Comment: occasional  . Drug use: No  . Sexual activity: Not Asked   Other Topics Concern  . None   Social History Narrative   0 caffeine drinks      Current Outpatient Prescriptions:  .  ALPRAZolam  (XANAX) 1 MG tablet, take 1 tablet by mouth four times a day (Patient taking differently: take 1mg  by mouth four times a day as needed for anxiety), Disp: 60 tablet, Rfl: 2 .  aspirin 325 MG tablet, Take 325 mg by mouth daily., Disp: , Rfl:  .  BENICAR 40 MG tablet, take 1 tablet by mouth once daily (Patient taking differently: take 1 tablet (40mg ) by mouth once daily), Disp: 60 tablet, Rfl: 5 .  CALCIUM PO, Take 1 capsule by mouth daily., Disp: , Rfl:  .  clobetasol cream (TEMOVATE) 0.27 %, Apply 1 application topically daily. , Disp: , Rfl:  .  cyclobenzaprine (FLEXERIL) 10 MG tablet, Take 1 tablet (10 mg total) by mouth 4 (four) times daily as needed. SPASMS, Disp: 30 tablet, Rfl: 5 .  cycloSPORINE (RESTASIS) 0.05 % ophthalmic emulsion, Place 1 drop into both eyes 2 (two) times daily., Disp: , Rfl:  .  famotidine-calcium carbonate-magnesium hydroxide (PEPCID COMPLETE) 10-800-165 MG CHEW chewable tablet, 1 -2 tablets as needed to prevent or treat heartburn (Patient taking differently: Chew 1-2 tablets by mouth daily as needed (heartburn). ), Disp: , Rfl:  .  FLUoxetine (PROZAC) 40 MG capsule, take 1 capsule by mouth once daily (Patient taking differently: take 1 capsule (40mg ) by mouth every evening), Disp: 90 capsule, Rfl: 2 .  fluticasone (FLONASE) 50 MCG/ACT nasal spray, Place 2 sprays into both nostrils daily., Disp: 16 g, Rfl: 6 .  ibuprofen (ADVIL,MOTRIN) 800 MG tablet, Take 800 mg by mouth every 8 (eight) hours as needed. pain, Disp: , Rfl:  .  meclizine (ANTIVERT) 25 MG tablet, Take 1 tablet (25 mg total) by mouth 3 (three) times daily as needed for dizziness., Disp: 30 tablet, Rfl: 3 .  metoprolol succinate (TOPROL-XL) 100 MG 24 hr tablet, take 1 tablet by mouth once daily take WITH OR IMMEDIATELY FOLLOWING A MEAL (Patient taking differently: take 1 tablet (100mg ) by mouth once daily take WITH OR IMMEDIATELY FOLLOWING A MEAL), Disp: 60 tablet, Rfl: 5 .  Multiple Vitamin (MULTIVITAMIN) tablet,  Take 1 tablet by mouth daily., Disp: , Rfl:  .  omeprazole (PRILOSEC) 40 MG capsule, take 1 capsule by mouth once daily, Disp: 60 capsule, Rfl: 5 .  albuterol (PROVENTIL HFA;VENTOLIN HFA) 108 (90 Base) MCG/ACT inhaler, Inhale 2 puffs into the lungs every 6 (six) hours as needed for wheezing or shortness of breath., Disp: 1 Inhaler, Rfl: 2  EXAM:  Vitals:   02/16/17 1323  BP: 120/78  Pulse: 66  Temp: 97.9 F (36.6 C)    Body mass  index is 47.03 kg/m.  GENERAL: vitals reviewed and listed above, alert, oriented, appears well hydrated and in no acute distress, morbidly obese  HEENT: atraumatic except for bruise R forehead, conjunttiva clear, no obvious abnormalities on inspection of external nose and ears  NECK: no obvious masses on inspection  LUNGS: clear to auscultation bilaterally, no wheezes, rales or rhonchi, good air movement  CV: HRRR, no peripheral edema  MS: moves all extremities without noticeable abnormality Normal Gait Normal inspection of back, no obvious scoliosis or leg length descrepancy No bony TTP Soft tissue TTP at: -/+ tests: neg trendelenburg,-facet loading, -SLRT, -CLRT Normal muscle strength, sensation to light touch and DTRs in LEs bilaterally  PSYCH: pleasant and cooperative, no obvious depression or anxiety  ASSESSMENT AND PLAN:  Discussed the following assessment and plan:  Fall, initial encounter  Acute low back pain, unspecified back pain laterality, with sciatica presence unspecified  DDD (degenerative disc disease), lumbar  Osteoarthritis of hip, unspecified laterality, unspecified osteoarthritis type  Allergic rhinitis due to pollen, unspecified seasonality  Hypoxia  -cont current treatments for muscle soreness, query mild recurrence R lumabr radiculopathy and advised f/u with PCP or ortho if persists or worsens -query mild underlying breathing issues related to weight or AR/asthma or viral illness. Alb refilled per her request to try  if needed and f/u with PCP about this and incidental findings of imaging - she has appt scheduled -return and er precautions -fall precuations -Patient advised to return or notify a doctor immediately if symptoms worsen or persist or new concerns arise.  Patient Instructions  Keep follow up with your doctor as scheduled.  Follow up with your orthopedic doctor if persistent back pain or concerns.  Try the albuterol as needed as we discussed - sent refill per your request.  I hope you are feeling better soon! Seek care immediately if worsening, new concerns or you are not improving with treatment.     Colin Benton R., DO

## 2017-02-16 ENCOUNTER — Ambulatory Visit (INDEPENDENT_AMBULATORY_CARE_PROVIDER_SITE_OTHER): Payer: Medicare Other | Admitting: Family Medicine

## 2017-02-16 ENCOUNTER — Encounter: Payer: Self-pay | Admitting: Family Medicine

## 2017-02-16 VITALS — BP 120/78 | HR 66 | Temp 97.9°F | Ht 67.0 in | Wt 300.3 lb

## 2017-02-16 DIAGNOSIS — M5136 Other intervertebral disc degeneration, lumbar region: Secondary | ICD-10-CM

## 2017-02-16 DIAGNOSIS — J301 Allergic rhinitis due to pollen: Secondary | ICD-10-CM

## 2017-02-16 DIAGNOSIS — R0902 Hypoxemia: Secondary | ICD-10-CM

## 2017-02-16 DIAGNOSIS — M545 Low back pain: Secondary | ICD-10-CM | POA: Diagnosis not present

## 2017-02-16 DIAGNOSIS — W19XXXA Unspecified fall, initial encounter: Secondary | ICD-10-CM

## 2017-02-16 DIAGNOSIS — M169 Osteoarthritis of hip, unspecified: Secondary | ICD-10-CM

## 2017-02-16 MED ORDER — ALBUTEROL SULFATE HFA 108 (90 BASE) MCG/ACT IN AERS: 2.0000 | INHALATION_SPRAY | Freq: Four times a day (QID) | RESPIRATORY_TRACT | 2 refills | Status: DC | PRN

## 2017-02-16 NOTE — Patient Instructions (Signed)
Keep follow up with your doctor as scheduled.  Follow up with your orthopedic doctor if persistent back pain or concerns.  Try the albuterol as needed as we discussed - sent refill per your request.  I hope you are feeling better soon! Seek care immediately if worsening, new concerns or you are not improving with treatment.

## 2017-02-20 ENCOUNTER — Ambulatory Visit: Payer: Self-pay | Admitting: Family Medicine

## 2017-03-08 ENCOUNTER — Other Ambulatory Visit: Payer: Self-pay | Admitting: Internal Medicine

## 2017-03-12 DIAGNOSIS — L821 Other seborrheic keratosis: Secondary | ICD-10-CM | POA: Diagnosis not present

## 2017-03-15 ENCOUNTER — Other Ambulatory Visit: Payer: Self-pay | Admitting: Internal Medicine

## 2017-03-16 ENCOUNTER — Other Ambulatory Visit: Payer: Self-pay | Admitting: Internal Medicine

## 2017-03-16 NOTE — Telephone Encounter (Signed)
Pt is suppose to be taking Xanax 1 mgtake 1mg  by mouth four times a day as needed for anxiety. Last refilled: 01/22/17  #60 2 refills  Please see message below, please advise

## 2017-03-16 NOTE — Telephone Encounter (Signed)
Pt calling to request this med ALPRAZolam [name]) 1 MG tablet  Pt states this rx has been done every 15 days for a while now. Pt now sure why this changed.  Pt states she will discuss the 30 day refill at her upcoming cpe, but she is leaving town today. Can you send to  [place], [place].  [facility])

## 2017-03-19 ENCOUNTER — Telehealth: Payer: Self-pay | Admitting: Internal Medicine

## 2017-03-19 NOTE — Telephone Encounter (Signed)
This Rx was phoned into [facility] in [place]. [facility] will not accept a a transfer of this med. Pt is at [facility] for the week. Can you [facility] into  [facility]?  [phone]

## 2017-03-19 NOTE — Telephone Encounter (Signed)
Pt states she is now out and starting to tremble a little bit.  Pt states this Rx has been refilling every 2 weeks, and hopes to get asap.  [facility]  [place], [place].

## 2017-03-19 NOTE — Telephone Encounter (Signed)
Paty to follow up with Dr. Raliegh Ip for approval.

## 2017-03-19 NOTE — Telephone Encounter (Signed)
Okay for refill Please schedule return office visit to discuss benzodiazepine use

## 2017-03-19 NOTE — Telephone Encounter (Signed)
° °  Pt is out of town and need the below med sent to a pharmacy out of town    ALPRAZolam [name]) 1 MG tablet  [facility]   Fax number  [phone]

## 2017-03-20 NOTE — Telephone Encounter (Signed)
Okay for medicine refill 

## 2017-03-20 NOTE — Telephone Encounter (Signed)
Ok to call in Rx to pharmacy in [facility]?  Please advise

## 2017-03-20 NOTE — Telephone Encounter (Signed)
Medication was called in as requested by pt.

## 2017-04-03 ENCOUNTER — Other Ambulatory Visit (INDEPENDENT_AMBULATORY_CARE_PROVIDER_SITE_OTHER): Payer: Medicare Other

## 2017-04-03 DIAGNOSIS — I10 Essential (primary) hypertension: Secondary | ICD-10-CM | POA: Diagnosis not present

## 2017-04-03 DIAGNOSIS — D649 Anemia, unspecified: Secondary | ICD-10-CM | POA: Diagnosis not present

## 2017-04-03 LAB — BASIC METABOLIC PANEL
BUN: 19 mg/dL (ref 6–23)
CO2: 35 mEq/L — ABNORMAL HIGH (ref 19–32)
Calcium: 9.6 mg/dL (ref 8.4–10.5)
Chloride: 97 mEq/L (ref 96–112)
Creatinine, Ser: 0.95 mg/dL (ref 0.40–1.20)
GFR: 62.69 mL/min (ref >60.00)
Glucose, Bld: 130 mg/dL — ABNORMAL HIGH (ref 70–99)
Potassium: 4.1 mEq/L (ref 3.5–5.1)
Sodium: 141 mEq/L (ref 135–145)

## 2017-04-03 LAB — HEPATIC FUNCTION PANEL
ALT: 27 U/L (ref 0–35)
AST: 25 U/L (ref 0–37)
Albumin: 4.1 g/dL (ref 3.5–5.2)
Alkaline Phosphatase: 76 U/L (ref 39–117)
Bilirubin, Direct: 0.2 mg/dL (ref 0.0–0.3)
Total Bilirubin: 0.9 mg/dL (ref 0.2–1.2)
Total Protein: 6.4 g/dL (ref 6.0–8.3)

## 2017-04-03 LAB — CBC WITH DIFFERENTIAL/PLATELET
Basophils Absolute: 0 10*3/uL (ref 0.0–0.1)
Basophils Relative: 0.2 % (ref 0.0–3.0)
Eosinophils Absolute: 0.2 10*3/uL (ref 0.0–0.7)
Eosinophils Relative: 4.6 % (ref 0.0–5.0)
HCT: 43.4 % (ref 36.0–46.0)
Hemoglobin: 14.6 g/dL (ref 12.0–15.0)
Lymphocytes Relative: 30.2 % (ref 12.0–46.0)
Lymphs Abs: 1.4 10*3/uL (ref 0.7–4.0)
MCHC: 33.7 g/dL (ref 30.0–36.0)
MCV: 92.4 fl (ref 78.0–100.0)
Monocytes Absolute: 0.4 10*3/uL (ref 0.1–1.0)
Monocytes Relative: 8.2 % (ref 3.0–12.0)
Neutro Abs: 2.6 10*3/uL (ref 1.4–7.7)
Neutrophils Relative %: 56.8 % (ref 43.0–77.0)
Platelets: 174 10*3/uL (ref 150.0–400.0)
RBC: 4.69 Mil/uL (ref 3.87–5.11)
RDW: 14.9 % (ref 11.5–15.5)
WBC: 4.6 10*3/uL (ref 4.0–10.5)

## 2017-04-03 LAB — TSH: TSH: 4.63 u[IU]/mL — ABNORMAL HIGH (ref 0.35–4.50)

## 2017-04-03 LAB — LIPID PANEL
Cholesterol: 251 mg/dL — ABNORMAL HIGH (ref 0–200)
HDL: 63.3 mg/dL (ref >39.00)
LDL Cholesterol: 153 mg/dL — ABNORMAL HIGH (ref 0–99)
NonHDL: 187.37
Total CHOL/HDL Ratio: 4
Triglycerides: 174 mg/dL — ABNORMAL HIGH (ref 0.0–149.0)
VLDL: 34.8 mg/dL (ref 0.0–40.0)

## 2017-04-13 ENCOUNTER — Ambulatory Visit (INDEPENDENT_AMBULATORY_CARE_PROVIDER_SITE_OTHER): Payer: Medicare Other | Admitting: Internal Medicine

## 2017-04-13 ENCOUNTER — Encounter: Payer: Self-pay | Admitting: Internal Medicine

## 2017-04-13 VITALS — BP 140/88 | HR 80 | Temp 97.9°F | Ht 66.5 in | Wt 296.8 lb

## 2017-04-13 DIAGNOSIS — Z6837 Body mass index (BMI) 37.0-37.9, adult: Secondary | ICD-10-CM | POA: Diagnosis not present

## 2017-04-13 DIAGNOSIS — M1610 Unilateral primary osteoarthritis, unspecified hip: Secondary | ICD-10-CM

## 2017-04-13 DIAGNOSIS — Z86718 Personal history of other venous thrombosis and embolism: Secondary | ICD-10-CM

## 2017-04-13 DIAGNOSIS — I10 Essential (primary) hypertension: Secondary | ICD-10-CM | POA: Diagnosis not present

## 2017-04-13 MED ORDER — ALPRAZOLAM 1 MG PO TABS: 1.0000 mg | ORAL_TABLET | Freq: Four times a day (QID) | ORAL | 2 refills | Status: DC

## 2017-04-13 NOTE — Progress Notes (Signed)
Subjective:    Patient ID: Kathy [name], female    DOB: [birth year], 66 y.o.   MRN: [id number]  HPI  66 year old patient who is seen today for an annual exam and initial Medicare wellness visit. She is followed by a number of consultants including GI for colonic polyps.  Her last colonoscopy was in April 2017. She is followed by rheumatology with fibromyalgia.  She also has had bilateral total hip replacement surgeries due to osteoarthritis. She has a history of exogenous obesity. She is followed dermatology annually with a prior history of melanoma involving the dorsal aspect of the left foot She is now followed by OB/GYN every other year.  She has had a complete hysterectomy in the past  Past Medical History:  Diagnosis Date  . Adenomatous colon polyp    2012  . Anxiety   . Anxiety and depression   . Arthritis    Osteoarthritis, Dr Maureen Ralphs  . Depression   . Diverticulosis   . Fibromyalgia    Dr Tobie Lords, Rheu  . GERD (gastroesophageal reflux disease)   . H/O hiatal hernia   . Headache(784.0)    PAST HX MILD MIGRAINES  . Heart murmur    mitral valve prolapse--no symptoms  . History of colonic polyps 08/08/2011       . HTN (hypertension)   . IBS (irritable bowel syndrome)   . Internal hemorrhoid   . Lumbar radiculopathy   . Obesity   . Syncope    idiopathic, ? related to migraines or hyperventilation. Dr Leonie Man, Neurology,  ONE TIME EPISODE AND THIS WAS YEARS AGO     Social History   Social History  . Marital status: Married    Spouse name: N/A  . Number of children: N/A  . Years of education: N/A   Occupational History  . Not on file.   Social History Main Topics  . Smoking status: Never Smoker  . Smokeless tobacco: Never Used  . Alcohol use Yes     Comment: occasional  . Drug use: No  . Sexual activity: Not on file   Other Topics Concern  . Not on file   Social History Narrative   0 caffeine drinks     Past Surgical History:  Procedure  Laterality Date  . ABDOMINAL HYSTERECTOMY     dysfunctional menses  . APPENDECTOMY    . BREAST LUMPECTOMY     right -BENIGN  . CHOLECYSTECTOMY    . COLONOSCOPY  05/27/2002, 08/08/11   2003 diverticulosis, hemorrhoids 2012 same + cecal polyp  . KNEE ARTHROSCOPY  2008   right  . MELANOMA EXCISION Left 07/2013   foot  . TOTAL HIP ARTHROPLASTY  02/2010   left ; Dr Maureen Ralphs  . TOTAL HIP ARTHROPLASTY  03/18/2012   Procedure: TOTAL HIP ARTHROPLASTY;  Surgeon: Gearlean Alf, MD;  Location: [facility];  Service: Orthopedics;  Laterality: Right;  . UPPER GASTROINTESTINAL ENDOSCOPY  04/21/2009, 08/08/11   2010 gastritis ;2012 small hiatal hernia. Dr Carlean Purl    Family History  Problem Relation Age of Onset  . Heart attack Mother 79  . COPD Mother   . Breast cancer Mother   . Hypertension Father   . Diabetes Father   . Heart failure Father 69  . Breast cancer Paternal Aunt        cns mets  . COPD Maternal Grandmother   . Heart disease Paternal Grandmother        died 40  . Diabetes Paternal Grandmother   .  Stroke Paternal Grandfather        in late 65s  . Heart attack Brother 4  . Colon cancer Neg Hx     Allergies  Allergen Reactions  . Penicillins     ? Facial angioedema  . Sulfamethoxazole-Trimethoprim     hives  . Achromycin [Tetracycline Hcl]     Dyspnea    . Nickel   . Zanaflex [Tizanidine Hydrochloride]     Causes more pain     Current Outpatient Prescriptions on File Prior to Visit  Medication Sig Dispense Refill  . albuterol (PROVENTIL HFA;VENTOLIN HFA) 108 (90 Base) MCG/ACT inhaler Inhale 2 puffs into the lungs every 6 (six) hours as needed for wheezing or shortness of breath. 1 Inhaler 2  . ALPRAZolam (XANAX) 1 MG tablet take 1 tablet by mouth four times a day 60 tablet 2  . aspirin 325 MG tablet Take 325 mg by mouth daily.    Marland Kitchen BENICAR 40 MG tablet take 1 tablet by mouth once daily (Patient taking differently: take 1 tablet (40mg ) by mouth once daily) 60 tablet 5  .  CALCIUM PO Take 1 capsule by mouth daily.    . clobetasol cream (TEMOVATE) 3.82 % Apply 1 application topically daily.     . cyclobenzaprine (FLEXERIL) 10 MG tablet Take 1 tablet (10 mg total) by mouth 4 (four) times daily as needed. SPASMS 30 tablet 5  . cycloSPORINE (RESTASIS) 0.05 % ophthalmic emulsion Place 1 drop into both eyes 2 (two) times daily.    . famotidine-calcium carbonate-magnesium hydroxide (PEPCID COMPLETE) 10-800-165 MG CHEW chewable tablet 1 -2 tablets as needed to prevent or treat heartburn (Patient taking differently: Chew 1-2 tablets by mouth daily as needed (heartburn). )    . FLUoxetine (PROZAC) 40 MG capsule take 1 capsule by mouth once daily (Patient taking differently: take 1 capsule (40mg ) by mouth every evening) 90 capsule 2  . fluticasone (FLONASE) 50 MCG/ACT nasal spray Place 2 sprays into both nostrils daily. 16 g 6  . ibuprofen (ADVIL,MOTRIN) 800 MG tablet Take 800 mg by mouth every 8 (eight) hours as needed. pain    . meclizine (ANTIVERT) 25 MG tablet Take 1 tablet (25 mg total) by mouth 3 (three) times daily as needed for dizziness. 30 tablet 3  . metoprolol succinate (TOPROL-XL) 100 MG 24 hr tablet take 1 tablet by mouth once daily take WITH OR IMMEDIATELY FOLLOWING A MEAL (Patient taking differently: take 1 tablet (100mg ) by mouth once daily take WITH OR IMMEDIATELY FOLLOWING A MEAL) 60 tablet 5  . Multiple Vitamin (MULTIVITAMIN) tablet Take 1 tablet by mouth daily.    Marland Kitchen omeprazole (PRILOSEC) 40 MG capsule take 1 capsule by mouth once daily 60 capsule 5   No current facility-administered medications on file prior to visit.     BP (!) 148/90 (BP Location: Left Arm, Patient Position: Sitting, Cuff Size: Normal)   Pulse 80   Temp 97.9 F (36.6 C) (Oral)   Ht 5' 6.5" (1.689 m)   Wt 296 lb 12.8 oz (134.6 kg)   SpO2 90%   BMI 47.19 kg/m   Initial Medicare wellness visit  1. Risk factors, based on past  M,S,F history.  Cardiovascular risk factors include a  history of hypertension  2.  Physical activities: fairly sedentary.  Does walk a bit, but very limited as far as her exercises.  She does have a treadmill and exercise bike at home  3.  Depression/mood:no history of major depression or mood disorder  4.  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  ADL's:independent  6.  Fall risk:moderately high.  Was evaluated in the ED last month after a fall.  She tripped over some equipment  7.  Home safety:no problems identified  8.  Height weight, and visual acuity;height and weight stable no change in visual acuity  9.  Counseling:continue efforts at weight loss.  More active lifestyle.  Recommended  10. Lab orders based on risk factors:laboratory studies reviewed  11. Referral : patient is followed by multiple consultants  12. Care plan:continue efforts at aggressive risk factor modification  13. Cognitive assessment: alert and oriented with normal affect no cognitive dysfunction  14. Screening: Patient provided with a written and personalized 5-10 year screening schedule in the AVS.    15. Provider List Update: primary care dermatology.  ENT, GI OB/GYN.  Rheumatology  16.  Advanced directives.  Patient does have living will and healthcare power of attorney in place    Review of Systems  Constitutional: Negative for appetite change, fatigue, fever and unexpected weight change.  HENT: Negative for congestion, dental problem, ear pain, hearing loss, mouth sores, nosebleeds, sinus pressure, sore throat, tinnitus, trouble swallowing and voice change.   Eyes: Negative for photophobia, pain, redness and visual disturbance.  Respiratory: Negative for cough, chest tightness and shortness of breath.   Cardiovascular: Negative for chest pain, palpitations and leg swelling.  Gastrointestinal: Negative for abdominal distention, abdominal pain, blood in stool, constipation, diarrhea, nausea, rectal pain and vomiting.  Genitourinary:  Negative for difficulty urinating, dysuria, flank pain, frequency, genital sores, hematuria, menstrual problem, pelvic pain, urgency, vaginal bleeding, vaginal discharge and vaginal pain.  Musculoskeletal: Positive for arthralgias, back pain and myalgias. Negative for neck stiffness.  Skin: Negative for rash.  Neurological: Negative for dizziness, syncope, speech difficulty, weakness, light-headedness, numbness and headaches.  Hematological: Negative for adenopathy. Does not bruise/bleed easily.  Psychiatric/Behavioral: Negative for agitation, behavioral problems, dysphoric mood, self-injury and suicidal ideas. The patient is not nervous/anxious.        Objective:   Physical Exam  Constitutional: She is oriented to person, place, and time. She appears well-developed and well-nourished.  Weight 296 Blood pressure 140/88  HENT:  Head: Normocephalic and atraumatic.  Right Ear: External ear normal.  Left Ear: External ear normal.  Mouth/Throat: Oropharynx is clear and moist.  Eyes: Conjunctivae and EOM are normal.  Neck: Normal range of motion. Neck supple. No JVD present. No thyromegaly present.  Cardiovascular: Normal rate, regular rhythm, normal heart sounds and intact distal pulses.   No murmur heard. Pulmonary/Chest: Effort normal and breath sounds normal. She has no wheezes. She has no rales.  Abdominal: Soft. Bowel sounds are normal. She exhibits no distension and no mass. There is no tenderness. There is no rebound and no guarding.  Genitourinary: Vagina normal.  Musculoskeletal: Normal range of motion. She exhibits no edema or tenderness.  Neurological: She is alert and oriented to person, place, and time. She has normal reflexes. No cranial nerve deficit. She exhibits normal muscle tone. Coordination normal.  Skin: Skin is warm and dry. No rash noted.  Psychiatric: She has a normal mood and affect. Her behavior is normal.          Assessment & Plan:   Initial Medicare  wellness visit Essential hypertension History colonic polyps Exogenous obesity Osteoarthritis History of fibromyalgia Anxiety disorder.  Alprazolam refilled  Follow-up 6 months  Anguel Delapena Pilar Plate

## 2017-04-13 NOTE — Patient Instructions (Signed)
Limit your sodium (Salt) intake  You need to lose weight.  Consider a lower calorie diet and regular exercise.    It is important that you exercise regularly, at least 20 minutes 3 to 4 times per week.  If you develop chest pain or shortness of breath seek  medical attention.  Return in 6 months for follow-up

## 2017-04-29 ENCOUNTER — Other Ambulatory Visit: Payer: Self-pay | Admitting: Internal Medicine

## 2017-05-10 ENCOUNTER — Other Ambulatory Visit: Payer: Self-pay | Admitting: Internal Medicine

## 2017-06-07 ENCOUNTER — Encounter: Payer: Self-pay | Admitting: Internal Medicine

## 2017-06-07 DIAGNOSIS — H02834 Dermatochalasis of left upper eyelid: Secondary | ICD-10-CM | POA: Diagnosis not present

## 2017-06-07 DIAGNOSIS — H53483 Generalized contraction of visual field, bilateral: Secondary | ICD-10-CM | POA: Diagnosis not present

## 2017-06-07 DIAGNOSIS — H02831 Dermatochalasis of right upper eyelid: Secondary | ICD-10-CM | POA: Diagnosis not present

## 2017-06-07 DIAGNOSIS — H02413 Mechanical ptosis of bilateral eyelids: Secondary | ICD-10-CM | POA: Diagnosis not present

## 2017-06-07 DIAGNOSIS — H02423 Myogenic ptosis of bilateral eyelids: Secondary | ICD-10-CM | POA: Diagnosis not present

## 2017-06-12 DIAGNOSIS — L814 Other melanin hyperpigmentation: Secondary | ICD-10-CM | POA: Diagnosis not present

## 2017-06-12 DIAGNOSIS — D225 Melanocytic nevi of trunk: Secondary | ICD-10-CM | POA: Diagnosis not present

## 2017-06-12 DIAGNOSIS — L219 Seborrheic dermatitis, unspecified: Secondary | ICD-10-CM | POA: Diagnosis not present

## 2017-06-12 DIAGNOSIS — D1801 Hemangioma of skin and subcutaneous tissue: Secondary | ICD-10-CM | POA: Diagnosis not present

## 2017-06-12 DIAGNOSIS — Z23 Encounter for immunization: Secondary | ICD-10-CM | POA: Diagnosis not present

## 2017-06-12 DIAGNOSIS — Z85820 Personal history of malignant melanoma of skin: Secondary | ICD-10-CM | POA: Diagnosis not present

## 2017-06-12 DIAGNOSIS — L719 Rosacea, unspecified: Secondary | ICD-10-CM | POA: Diagnosis not present

## 2017-06-12 DIAGNOSIS — L821 Other seborrheic keratosis: Secondary | ICD-10-CM | POA: Diagnosis not present

## 2017-07-08 ENCOUNTER — Other Ambulatory Visit: Payer: Self-pay | Admitting: Internal Medicine

## 2017-07-11 ENCOUNTER — Telehealth: Payer: Self-pay

## 2017-07-11 NOTE — Telephone Encounter (Signed)
Received the prior authorization for the Alprazolam 1 mg tablet. The prior authorization has been submitted via covermymeds & we are waiting for a response. Responses can take up to 5-7 business days depending on the insurance company. The key for covermymeds is [id number].

## 2017-07-11 NOTE — Telephone Encounter (Signed)
The prior authorization has been approved through 07/11/2018 as long as the patient remains covered under the same plan. The approval has been faxed back to the patient's pharmacy.

## 2017-07-30 DIAGNOSIS — H02413 Mechanical ptosis of bilateral eyelids: Secondary | ICD-10-CM | POA: Diagnosis not present

## 2017-07-30 DIAGNOSIS — H53489 Generalized contraction of visual field, unspecified eye: Secondary | ICD-10-CM | POA: Diagnosis not present

## 2017-07-30 DIAGNOSIS — H53483 Generalized contraction of visual field, bilateral: Secondary | ICD-10-CM | POA: Diagnosis not present

## 2017-07-30 DIAGNOSIS — H02831 Dermatochalasis of right upper eyelid: Secondary | ICD-10-CM | POA: Diagnosis not present

## 2017-07-30 DIAGNOSIS — H53453 Other localized visual field defect, bilateral: Secondary | ICD-10-CM | POA: Diagnosis not present

## 2017-07-30 DIAGNOSIS — H0279 Other degenerative disorders of eyelid and periocular area: Secondary | ICD-10-CM | POA: Diagnosis not present

## 2017-07-30 DIAGNOSIS — H02834 Dermatochalasis of left upper eyelid: Secondary | ICD-10-CM | POA: Diagnosis not present

## 2017-07-30 DIAGNOSIS — H02423 Myogenic ptosis of bilateral eyelids: Secondary | ICD-10-CM | POA: Diagnosis not present

## 2017-08-02 DIAGNOSIS — T8130XA Disruption of wound, unspecified, initial encounter: Secondary | ICD-10-CM | POA: Diagnosis not present

## 2017-08-03 ENCOUNTER — Telehealth: Payer: Self-pay | Admitting: Internal Medicine

## 2017-08-03 NOTE — Telephone Encounter (Signed)
Copied from [place] [id number]. Topic: Quick Communication - See Telephone Encounter >> Aug 03, 2017  4:13 PM Robina Ade, Helene Kelp D wrote: CRM for notification. See Telephone encounter for: 08/03/17. Patient needs refill on her xanax and [facility] [place].

## 2017-08-06 ENCOUNTER — Other Ambulatory Visit: Payer: Self-pay | Admitting: Internal Medicine

## 2017-08-06 ENCOUNTER — Telehealth: Payer: Self-pay | Admitting: Internal Medicine

## 2017-08-06 MED ORDER — ALPRAZOLAM 1 MG PO TABS: 1.0000 mg | ORAL_TABLET | Freq: Four times a day (QID) | ORAL | 0 refills | Status: DC

## 2017-08-06 MED ORDER — OMEPRAZOLE 40 MG PO CPDR: DELAYED_RELEASE_CAPSULE | ORAL | 3 refills | Status: DC

## 2017-08-06 NOTE — Telephone Encounter (Signed)
medication quantity was clarified with Dr Raliegh Ip, ok for #120 tablets. Pharmacy was called and I spoke with Tye [place] to cancel the Rx for #90 tablets Pt was made aware as well.

## 2017-08-06 NOTE — Telephone Encounter (Signed)
Reviewed chart and script was called in.

## 2017-08-06 NOTE — Telephone Encounter (Signed)
Pt has run out of Xanax. She has been taking  3- 4 x day as prescribed and was prescribed only 90 tablets which equal 22.5 days.  Is needing Xanax refilled. Has been breaking pills in half to bridge the gap   Using [facility] [place] [phone]

## 2017-08-06 NOTE — Telephone Encounter (Signed)
  Patient's medication was refill on 07/09/17  (Xanax must last 30 days)  Unable to refill early. [facility] [place].

## 2017-08-06 NOTE — Telephone Encounter (Signed)
Copied from [place] [id number]. Topic: Inquiry >> Aug 06, 2017  1:28 PM Pricilla Handler wrote: Reason for CRM: Patient called wanting a refill of Xanax asap. Patient stated that she received 90 pills, but has to take them 4 per day. Patient stated that this causes her to only have pills for 22 days a month (taking 4 per Day).

## 2017-08-15 ENCOUNTER — Telehealth: Payer: Self-pay | Admitting: Internal Medicine

## 2017-08-15 ENCOUNTER — Other Ambulatory Visit: Payer: Self-pay | Admitting: Internal Medicine

## 2017-08-15 DIAGNOSIS — Z1231 Encounter for screening mammogram for malignant neoplasm of breast: Secondary | ICD-10-CM

## 2017-08-15 MED ORDER — METOPROLOL SUCCINATE ER 100 MG PO TB24: ORAL_TABLET | ORAL | 1 refills | Status: DC

## 2017-08-15 MED ORDER — OLMESARTAN MEDOXOMIL 40 MG PO TABS: 40.0000 mg | ORAL_TABLET | Freq: Every day | ORAL | 1 refills | Status: DC

## 2017-08-15 NOTE — Telephone Encounter (Signed)
Copied from [place] #[id number]. Topic: Quick Communication - Rx Refill/Question >> Aug 15, 2017  2:23 PM Carolyn Stare wrote: Has the patient contacted their pharmacy? yes   RX; OLMESARTAN , METOPROLOL   Preferred Pharmacy (with phone number or street name):   [facility]    [phone]   Agent: Please be advised that RX refills may take up to 48 hours. We ask that you follow-up with your pharmacy.

## 2017-08-20 ENCOUNTER — Encounter: Payer: Self-pay | Admitting: Internal Medicine

## 2017-08-20 ENCOUNTER — Ambulatory Visit (INDEPENDENT_AMBULATORY_CARE_PROVIDER_SITE_OTHER): Payer: Medicare Other | Admitting: Internal Medicine

## 2017-08-20 VITALS — BP 132/70 | HR 116 | Temp 98.2°F | Ht 66.5 in | Wt 289.8 lb

## 2017-08-20 DIAGNOSIS — M7550 Bursitis of unspecified shoulder: Secondary | ICD-10-CM | POA: Diagnosis not present

## 2017-08-20 DIAGNOSIS — M2550 Pain in unspecified joint: Secondary | ICD-10-CM | POA: Diagnosis not present

## 2017-08-20 DIAGNOSIS — I10 Essential (primary) hypertension: Secondary | ICD-10-CM

## 2017-08-20 DIAGNOSIS — G8929 Other chronic pain: Secondary | ICD-10-CM

## 2017-08-20 DIAGNOSIS — M545 Low back pain, unspecified: Secondary | ICD-10-CM

## 2017-08-20 DIAGNOSIS — M1611 Unilateral primary osteoarthritis, right hip: Secondary | ICD-10-CM

## 2017-08-20 DIAGNOSIS — Z6841 Body Mass Index (BMI) 40.0 and over, adult: Secondary | ICD-10-CM | POA: Diagnosis not present

## 2017-08-20 DIAGNOSIS — M797 Fibromyalgia: Secondary | ICD-10-CM | POA: Diagnosis not present

## 2017-08-20 DIAGNOSIS — M150 Primary generalized (osteo)arthritis: Secondary | ICD-10-CM | POA: Diagnosis not present

## 2017-08-20 NOTE — Progress Notes (Signed)
Subjective:    Patient ID: Kathy [name], female    DOB: 12/30/1950, 67 y.o.   MRN: [id number]  HPI 66 year old patient who has a history of essential hypertension.  Her mother has a history of breast cancer. The patient has had some discomfort in the right axillary area for several months.  She is scheduled for diagnostic mammogram with ultrasound.  It was suggested by the breast center that she had a clinical evaluation  Past Medical History:  Diagnosis Date  . Adenomatous colon polyp    2012  . Anxiety   . Anxiety and depression   . Arthritis    Osteoarthritis, Dr Maureen Ralphs  . Depression   . Diverticulosis   . Fibromyalgia    Dr Tobie Lords, Rheu  . GERD (gastroesophageal reflux disease)   . H/O hiatal hernia   . Headache(784.0)    PAST HX MILD MIGRAINES  . Heart murmur    mitral valve prolapse--no symptoms  . History of colonic polyps 08/08/2011       . HTN (hypertension)   . IBS (irritable bowel syndrome)   . Internal hemorrhoid   . Lumbar radiculopathy   . Obesity   . Syncope    idiopathic, ? related to migraines or hyperventilation. Dr Leonie Man, Neurology,  ONE TIME EPISODE AND THIS WAS YEARS AGO     Social History   Socioeconomic History  . Marital status: Married    Spouse name: Not on file  . Number of children: Not on file  . Years of education: Not on file  . Highest education level: Not on file  Social Needs  . Financial resource strain: Not on file  . Food insecurity - worry: Not on file  . Food insecurity - inability: Not on file  . Transportation needs - medical: Not on file  . Transportation needs - non-medical: Not on file  Occupational History  . Not on file  Tobacco Use  . Smoking status: Never Smoker  . Smokeless tobacco: Never Used  Substance and Sexual Activity  . Alcohol use: Yes    Comment: occasional  . Drug use: No  . Sexual activity: Not on file  Other Topics Concern  . Not on file  Social History Narrative   0 caffeine  drinks     Past Surgical History:  Procedure Laterality Date  . ABDOMINAL HYSTERECTOMY     dysfunctional menses  . APPENDECTOMY    . BREAST LUMPECTOMY     right -BENIGN  . CHOLECYSTECTOMY    . COLONOSCOPY  05/27/2002, 08/08/11   2003 diverticulosis, hemorrhoids 2012 same + cecal polyp  . KNEE ARTHROSCOPY  2008   right  . MELANOMA EXCISION Left 07/2013   foot  . TOTAL HIP ARTHROPLASTY  02/2010   left ; Dr Maureen Ralphs  . TOTAL HIP ARTHROPLASTY  03/18/2012   Procedure: TOTAL HIP ARTHROPLASTY;  Surgeon: Gearlean Alf, MD;  Location: [facility];  Service: Orthopedics;  Laterality: Right;  . UPPER GASTROINTESTINAL ENDOSCOPY  04/21/2009, 08/08/11   2010 gastritis ;2012 small hiatal hernia. Dr Carlean Purl    Family History  Problem Relation Age of Onset  . Heart attack Mother 71  . COPD Mother   . Breast cancer Mother   . Hypertension Father   . Diabetes Father   . Heart failure Father 42  . Breast cancer Paternal Aunt        cns mets  . COPD Maternal Grandmother   . Heart disease Paternal Grandmother  died 48  . Diabetes Paternal Grandmother   . Stroke Paternal Grandfather        in late 63s  . Heart attack Brother 75  . Colon cancer Neg Hx     Allergies  Allergen Reactions  . Penicillins     ? Facial angioedema  . Sulfamethoxazole-Trimethoprim     hives  . Achromycin [Tetracycline Hcl]     Dyspnea    . Nickel   . Zanaflex [Tizanidine Hydrochloride]     Causes more pain     Current Outpatient Medications on File Prior to Visit  Medication Sig Dispense Refill  . albuterol (PROVENTIL HFA;VENTOLIN HFA) 108 (90 Base) MCG/ACT inhaler Inhale 2 puffs into the lungs every 6 (six) hours as needed for wheezing or shortness of breath. 1 Inhaler 2  . ALPRAZolam (XANAX) 1 MG tablet Take 1 tablet (1 mg total) 4 (four) times daily by mouth. 120 tablet 0  . aspirin 325 MG tablet Take 325 mg by mouth daily.    Marland Kitchen CALCIUM PO Take 1 capsule by mouth daily.    . clobetasol cream (TEMOVATE)  5.18 % Apply 1 application topically daily.     . cyclobenzaprine (FLEXERIL) 10 MG tablet Take 1 tablet (10 mg total) by mouth 4 (four) times daily as needed. SPASMS 30 tablet 5  . cycloSPORINE (RESTASIS) 0.05 % ophthalmic emulsion Place 1 drop into both eyes 2 (two) times daily.    . famotidine-calcium carbonate-magnesium hydroxide (PEPCID COMPLETE) 10-800-165 MG CHEW chewable tablet 1 -2 tablets as needed to prevent or treat heartburn (Patient taking differently: Chew 1-2 tablets by mouth daily as needed (heartburn). )    . FLUoxetine (PROZAC) 40 MG capsule take 1 capsule by mouth once daily (Patient taking differently: take 1 capsule (40mg ) by mouth every evening) 90 capsule 2  . fluticasone (FLONASE) 50 MCG/ACT nasal spray Place 2 sprays into both nostrils daily. 16 g 6  . ibuprofen (ADVIL,MOTRIN) 800 MG tablet Take 800 mg by mouth every 8 (eight) hours as needed. pain    . meclizine (ANTIVERT) 25 MG tablet Take 1 tablet (25 mg total) by mouth 3 (three) times daily as needed for dizziness. 30 tablet 3  . metoprolol succinate (TOPROL-XL) 100 MG 24 hr tablet TAKE 1 TABLET BY MOUTH ONCE DAILY TAKE WITH OR IMMEDIATELY FOLLOWING A MEAL 60 tablet 1  . Multiple Vitamin (MULTIVITAMIN) tablet Take 1 tablet by mouth daily.    Marland Kitchen olmesartan (BENICAR) 40 MG tablet Take 1 tablet (40 mg total) by mouth daily. 60 tablet 1  . omeprazole (PRILOSEC) 40 MG capsule TAKE 1 CAPSULE BY MOUTH EVERY DAY 90 capsule 3   No current facility-administered medications on file prior to visit.     BP 132/70 (BP Location: Left Arm, Patient Position: Sitting, Cuff Size: Normal)   Pulse (!) 116   Temp 98.2 F (36.8 C) (Oral)   Ht 5' 6.5" (1.689 m)   Wt 289 lb 12.8 oz (131.5 kg)   SpO2 98%   BMI 46.07 kg/m      Review of Systems  Constitutional: Negative.   HENT: Negative for congestion, dental problem, hearing loss, rhinorrhea, sinus pressure, sore throat and tinnitus.   Eyes: Negative for pain, discharge and visual  disturbance.  Respiratory: Negative for cough and shortness of breath.   Cardiovascular: Negative for chest pain, palpitations and leg swelling.  Gastrointestinal: Negative for abdominal distention, abdominal pain, blood in stool, constipation, diarrhea, nausea and vomiting.  Genitourinary: Negative for difficulty urinating,  dysuria, flank pain, frequency, hematuria, pelvic pain, urgency, vaginal bleeding, vaginal discharge and vaginal pain.  Musculoskeletal: Negative for arthralgias, gait problem and joint swelling.  Skin: Negative for rash.  Neurological: Negative for dizziness, syncope, speech difficulty, weakness, numbness and headaches.  Hematological: Negative for adenopathy.  Psychiatric/Behavioral: Negative for agitation, behavioral problems and dysphoric mood. The patient is not nervous/anxious.        Objective:   Physical Exam  Constitutional: She is oriented to person, place, and time. She appears well-developed and well-nourished.  Blood pressure normal Pulse 100  HENT:  Head: Normocephalic.  Right Ear: External ear normal.  Left Ear: External ear normal.  Mouth/Throat: Oropharynx is clear and moist.  Eyes: Conjunctivae and EOM are normal. Pupils are equal, round, and reactive to light.  Neck: Normal range of motion. Neck supple. No thyromegaly present.  Cardiovascular: Normal rate, regular rhythm, normal heart sounds and intact distal pulses.  Pulmonary/Chest: Effort normal and breath sounds normal.  Axilla clear Rest examination negative  Abdominal: Soft. Bowel sounds are normal. She exhibits no mass. There is no tenderness.  Musculoskeletal: Normal range of motion.  Lymphadenopathy:    She has no cervical adenopathy.  Neurological: She is alert and oriented to person, place, and time.  Skin: Skin is warm and dry. No rash noted.  Psychiatric: She has a normal mood and affect. Her behavior is normal.          Assessment & Plan:   Essential hypertension  well-controlled Obesity Family history of breast cancer.  We will schedule diagnostic mammogram with breast ultrasound  Annual CPX as scheduled  Nyoka Cowden

## 2017-08-20 NOTE — Patient Instructions (Signed)
Return as scheduled for your annual exam  Limit your sodium (Salt) intake  Please check your blood pressure on a regular basis.  If it is consistently greater than 150/90, please make an office appointment.    It is important that you exercise regularly, at least 20 minutes 3 to 4 times per week.  If you develop chest pain or shortness of breath seek  medical attention.  She appears well, in no apparent distress.  Alert and oriented times three, pleasant and cooperative. Vital signs are as documented in vital signs section.  Diagnostic mammogram and breast ultrasound as discussed

## 2017-08-21 ENCOUNTER — Other Ambulatory Visit: Payer: Self-pay | Admitting: Internal Medicine

## 2017-08-21 DIAGNOSIS — N644 Mastodynia: Secondary | ICD-10-CM

## 2017-08-23 ENCOUNTER — Other Ambulatory Visit: Payer: Self-pay | Admitting: Internal Medicine

## 2017-08-23 DIAGNOSIS — N644 Mastodynia: Secondary | ICD-10-CM

## 2017-09-04 ENCOUNTER — Ambulatory Visit
Admission: RE | Admit: 2017-09-04 | Discharge: 2017-09-04 | Disposition: A | Discharge status: Home / Self Care | Payer: Medicare Other | Source: Ambulatory Visit | Attending: Internal Medicine | Admitting: Internal Medicine

## 2017-09-04 DIAGNOSIS — N644 Mastodynia: Secondary | ICD-10-CM

## 2017-09-04 DIAGNOSIS — R922 Inconclusive mammogram: Secondary | ICD-10-CM | POA: Diagnosis not present

## 2017-09-12 ENCOUNTER — Telehealth: Payer: Self-pay | Admitting: Internal Medicine

## 2017-09-12 ENCOUNTER — Other Ambulatory Visit: Payer: Self-pay | Admitting: Internal Medicine

## 2017-09-12 NOTE — Telephone Encounter (Signed)
Copied from [place] [id number]. Topic: Quick Communication - Rx Refill/Question >> Sep 12, 2017 12:27 PM Lennox Solders wrote: Has the patient contacted their pharmacy?yes (Agent: If no, request that the patient contact the pharmacy for the refill.)   Preferred Pharmacy (with phone number or street name): [facility]. [phone]. Pt needs refill on alprazolam

## 2017-09-13 NOTE — Telephone Encounter (Signed)
Medication was called in to [place], [place] . Pt was made aware.

## 2017-10-09 ENCOUNTER — Ambulatory Visit (INDEPENDENT_AMBULATORY_CARE_PROVIDER_SITE_OTHER): Payer: Medicare Other | Admitting: Internal Medicine

## 2017-10-09 ENCOUNTER — Encounter: Payer: Self-pay | Admitting: Internal Medicine

## 2017-10-09 VITALS — BP 138/80 | HR 87 | Temp 97.2°F | Ht 66.5 in | Wt 287.0 lb

## 2017-10-09 DIAGNOSIS — I10 Essential (primary) hypertension: Secondary | ICD-10-CM | POA: Diagnosis not present

## 2017-10-09 DIAGNOSIS — M1611 Unilateral primary osteoarthritis, right hip: Secondary | ICD-10-CM | POA: Diagnosis not present

## 2017-10-09 DIAGNOSIS — K219 Gastro-esophageal reflux disease without esophagitis: Secondary | ICD-10-CM | POA: Diagnosis not present

## 2017-10-09 NOTE — Progress Notes (Signed)
Subjective:    Patient ID: Kathy [name], female    DOB: Feb 13, 1951, 67 y.o.   MRN: [id number]  HPI  67 year old patient who is seen today for her 52-month follow-up. She has a history of essential hypertension. She has been seen by rheumatology in the past with a diagnosis of fibromyalgia.  She has a history of IBS. She has osteoarthritis and history of lumbar radiculopathy and status post bilateral total hip replacement surgeries She fell 7 days ago sustaining some trauma to the left arm area. Complaints today include night sweats of 2 months duration  Multiple somatic complaints  Past Medical History:  Diagnosis Date  . Adenomatous colon polyp    2012  . Anxiety   . Anxiety and depression   . Arthritis    Osteoarthritis, Dr Maureen Ralphs  . Depression   . Diverticulosis   . Fibromyalgia    Dr Tobie Lords, Rheu  . GERD (gastroesophageal reflux disease)   . H/O hiatal hernia   . Headache(784.0)    PAST HX MILD MIGRAINES  . Heart murmur    mitral valve prolapse--no symptoms  . History of colonic polyps 08/08/2011       . HTN (hypertension)   . IBS (irritable bowel syndrome)   . Internal hemorrhoid   . Lumbar radiculopathy   . Obesity   . Syncope    idiopathic, ? related to migraines or hyperventilation. Dr Leonie Man, Neurology,  ONE TIME EPISODE AND THIS WAS YEARS AGO     Social History   Socioeconomic History  . Marital status: Married    Spouse name: Not on file  . Number of children: Not on file  . Years of education: Not on file  . Highest education level: Not on file  Social Needs  . Financial resource strain: Not on file  . Food insecurity - worry: Not on file  . Food insecurity - inability: Not on file  . Transportation needs - medical: Not on file  . Transportation needs - non-medical: Not on file  Occupational History  . Not on file  Tobacco Use  . Smoking status: Never Smoker  . Smokeless tobacco: Never Used  Substance and Sexual Activity  .  Alcohol use: Yes    Comment: occasional  . Drug use: No  . Sexual activity: Not on file  Other Topics Concern  . Not on file  Social History Narrative   0 caffeine drinks     Past Surgical History:  Procedure Laterality Date  . ABDOMINAL HYSTERECTOMY     dysfunctional menses  . APPENDECTOMY    . BREAST EXCISIONAL BIOPSY Right   . CHOLECYSTECTOMY    . COLONOSCOPY  05/27/2002, 08/08/11   2003 diverticulosis, hemorrhoids 2012 same + cecal polyp  . KNEE ARTHROSCOPY  2008   right  . MELANOMA EXCISION Left 07/2013   foot  . TOTAL HIP ARTHROPLASTY  02/2010   left ; Dr Maureen Ralphs  . TOTAL HIP ARTHROPLASTY  03/18/2012   Procedure: TOTAL HIP ARTHROPLASTY;  Surgeon: Gearlean Alf, MD;  Location: [facility];  Service: Orthopedics;  Laterality: Right;  . UPPER GASTROINTESTINAL ENDOSCOPY  04/21/2009, 08/08/11   2010 gastritis ;2012 small hiatal hernia. Dr Carlean Purl    Family History  Problem Relation Age of Onset  . Heart attack Mother 44  . COPD Mother   . Breast cancer Mother 46  . Hypertension Father   . Diabetes Father   . Heart failure Father 31  . Breast cancer Paternal Aunt  cns mets  . COPD Maternal Grandmother   . Heart disease Paternal Grandmother        died 76  . Diabetes Paternal Grandmother   . Stroke Paternal Grandfather        in late 35s  . Heart attack Brother 46  . Colon cancer Neg Hx     Allergies  Allergen Reactions  . Penicillins     ? Facial angioedema  . Sulfamethoxazole-Trimethoprim     hives  . Achromycin [Tetracycline Hcl]     Dyspnea    . Nickel   . Zanaflex [Tizanidine Hydrochloride]     Causes more pain     Current Outpatient Medications on File Prior to Visit  Medication Sig Dispense Refill  . albuterol (PROVENTIL HFA;VENTOLIN HFA) 108 (90 Base) MCG/ACT inhaler Inhale 2 puffs into the lungs every 6 (six) hours as needed for wheezing or shortness of breath. 1 Inhaler 2  . ALPRAZolam (XANAX) 1 MG tablet TAKE 1 TABLET BY MOUTH 4 TIMES A DAY  120 tablet 0  . aspirin 325 MG tablet Take 325 mg by mouth daily.    Marland Kitchen CALCIUM PO Take 1 capsule by mouth daily.    . clobetasol cream (TEMOVATE) 0.27 % Apply 1 application topically daily.     . cyclobenzaprine (FLEXERIL) 10 MG tablet Take 1 tablet (10 mg total) by mouth 4 (four) times daily as needed. SPASMS 30 tablet 5  . cycloSPORINE (RESTASIS) 0.05 % ophthalmic emulsion Place 1 drop into both eyes 2 (two) times daily.    . famotidine-calcium carbonate-magnesium hydroxide (PEPCID COMPLETE) 10-800-165 MG CHEW chewable tablet 1 -2 tablets as needed to prevent or treat heartburn (Patient taking differently: Chew 1-2 tablets by mouth daily as needed (heartburn). )    . FLUoxetine (PROZAC) 40 MG capsule take 1 capsule by mouth once daily (Patient taking differently: take 1 capsule (40mg ) by mouth every evening) 90 capsule 2  . fluticasone (FLONASE) 50 MCG/ACT nasal spray Place 2 sprays into both nostrils daily. 16 g 6  . ibuprofen (ADVIL,MOTRIN) 800 MG tablet Take 800 mg by mouth every 8 (eight) hours as needed. pain    . meclizine (ANTIVERT) 25 MG tablet Take 1 tablet (25 mg total) by mouth 3 (three) times daily as needed for dizziness. 30 tablet 3  . metoprolol succinate (TOPROL-XL) 100 MG 24 hr tablet TAKE 1 TABLET BY MOUTH ONCE DAILY TAKE WITH OR IMMEDIATELY FOLLOWING A MEAL 60 tablet 1  . Multiple Vitamin (MULTIVITAMIN) tablet Take 1 tablet by mouth daily.    Marland Kitchen olmesartan (BENICAR) 40 MG tablet Take 1 tablet (40 mg total) by mouth daily. 60 tablet 1  . omeprazole (PRILOSEC) 40 MG capsule TAKE 1 CAPSULE BY MOUTH EVERY DAY 90 capsule 3   No current facility-administered medications on file prior to visit.     BP 138/80 (BP Location: Left Arm, Patient Position: Sitting, Cuff Size: Normal)   Pulse 87   Temp (!) 97.2 F (36.2 C) (Oral)   Ht 5' 6.5" (1.689 m)   Wt 287 lb (130.2 kg)   SpO2 96%   BMI 45.63 kg/m     Review of Systems  Constitutional: Positive for appetite change and  fatigue.  HENT: Negative for congestion, dental problem, hearing loss, rhinorrhea, sinus pressure, sore throat and tinnitus.   Eyes: Negative for pain, discharge and visual disturbance.  Respiratory: Negative for cough and shortness of breath.   Cardiovascular: Negative for chest pain, palpitations and leg swelling.  Gastrointestinal: Negative  for abdominal distention, abdominal pain, blood in stool, constipation, diarrhea, nausea and vomiting.  Genitourinary: Negative for difficulty urinating, dysuria, flank pain, frequency, hematuria, pelvic pain, urgency, vaginal bleeding, vaginal discharge and vaginal pain.  Musculoskeletal: Positive for arthralgias, back pain and myalgias. Negative for gait problem and joint swelling.  Skin: Negative for rash.  Neurological: Negative for dizziness, syncope, speech difficulty, weakness, numbness and headaches.  Hematological: Negative for adenopathy.  Psychiatric/Behavioral: Negative for agitation, behavioral problems and dysphoric mood. The patient is nervous/anxious.        Objective:   Physical Exam  Constitutional: She is oriented to person, place, and time. She appears well-developed and well-nourished.  Weight 287 Blood pressure stable   HENT:  Head: Normocephalic.  Right Ear: External ear normal.  Left Ear: External ear normal.  Mouth/Throat: Oropharynx is clear and moist.  Eyes: Conjunctivae and EOM are normal. Pupils are equal, round, and reactive to light.  Neck: Normal range of motion. Neck supple. No thyromegaly present.  Cardiovascular: Normal rate, regular rhythm, normal heart sounds and intact distal pulses.  Pulmonary/Chest: Effort normal and breath sounds normal.  Abdominal: Soft. Bowel sounds are normal. She exhibits no mass. There is no tenderness.  Musculoskeletal: Normal range of motion.  Lymphadenopathy:    She has no cervical adenopathy.  Neurological: She is alert and oriented to person, place, and time.  Skin: Skin is  warm and dry. No rash noted.  Psychiatric: She has a normal mood and affect. Her behavior is normal.          Assessment & Plan:   Osteoarthritis IBS Essential hypertension well-controlled Anxiety disorder  Medications updated  Preventive health.  Immunizations discussed she wishes to defer at this time.  She is concerned about intolerance to various vaccines.  We will schedule CPX and address at her next visit  Nyoka Cowden

## 2017-10-09 NOTE — Patient Instructions (Signed)
Limit your sodium (Salt) intake  Please check your blood pressure on a regular basis.  If it is consistently greater than 140/90, please make an office appointment.  Return in 6 months for follow-up  

## 2017-10-11 ENCOUNTER — Other Ambulatory Visit: Payer: Self-pay | Admitting: Internal Medicine

## 2017-10-28 ENCOUNTER — Other Ambulatory Visit: Payer: Self-pay | Admitting: Internal Medicine

## 2017-10-30 ENCOUNTER — Other Ambulatory Visit: Payer: Self-pay | Admitting: Internal Medicine

## 2017-11-12 ENCOUNTER — Other Ambulatory Visit: Payer: Self-pay | Admitting: Internal Medicine

## 2017-11-13 ENCOUNTER — Other Ambulatory Visit: Payer: Self-pay

## 2017-11-20 DIAGNOSIS — H57813 Brow ptosis, bilateral: Secondary | ICD-10-CM | POA: Diagnosis not present

## 2017-11-20 DIAGNOSIS — H0279 Other degenerative disorders of eyelid and periocular area: Secondary | ICD-10-CM | POA: Diagnosis not present

## 2017-11-21 DIAGNOSIS — M150 Primary generalized (osteo)arthritis: Secondary | ICD-10-CM | POA: Diagnosis not present

## 2017-11-21 DIAGNOSIS — M25512 Pain in left shoulder: Secondary | ICD-10-CM | POA: Diagnosis not present

## 2017-11-21 DIAGNOSIS — M7550 Bursitis of unspecified shoulder: Secondary | ICD-10-CM | POA: Diagnosis not present

## 2017-11-21 DIAGNOSIS — Z6841 Body Mass Index (BMI) 40.0 and over, adult: Secondary | ICD-10-CM | POA: Diagnosis not present

## 2017-11-21 DIAGNOSIS — M2550 Pain in unspecified joint: Secondary | ICD-10-CM | POA: Diagnosis not present

## 2017-11-21 DIAGNOSIS — M797 Fibromyalgia: Secondary | ICD-10-CM | POA: Diagnosis not present

## 2017-11-21 DIAGNOSIS — M545 Low back pain: Secondary | ICD-10-CM | POA: Diagnosis not present

## 2017-11-21 DIAGNOSIS — M542 Cervicalgia: Secondary | ICD-10-CM | POA: Diagnosis not present

## 2017-11-27 DIAGNOSIS — D225 Melanocytic nevi of trunk: Secondary | ICD-10-CM | POA: Diagnosis not present

## 2017-11-27 DIAGNOSIS — L814 Other melanin hyperpigmentation: Secondary | ICD-10-CM | POA: Diagnosis not present

## 2017-11-27 DIAGNOSIS — L821 Other seborrheic keratosis: Secondary | ICD-10-CM | POA: Diagnosis not present

## 2017-11-27 DIAGNOSIS — Z23 Encounter for immunization: Secondary | ICD-10-CM | POA: Diagnosis not present

## 2017-11-27 DIAGNOSIS — Z86018 Personal history of other benign neoplasm: Secondary | ICD-10-CM | POA: Diagnosis not present

## 2017-11-27 DIAGNOSIS — Z85820 Personal history of malignant melanoma of skin: Secondary | ICD-10-CM | POA: Diagnosis not present

## 2017-11-27 DIAGNOSIS — D1801 Hemangioma of skin and subcutaneous tissue: Secondary | ICD-10-CM | POA: Diagnosis not present

## 2017-12-04 DIAGNOSIS — M5030 Other cervical disc degeneration, unspecified cervical region: Secondary | ICD-10-CM | POA: Diagnosis not present

## 2017-12-04 DIAGNOSIS — M25511 Pain in right shoulder: Secondary | ICD-10-CM | POA: Diagnosis not present

## 2017-12-04 DIAGNOSIS — M542 Cervicalgia: Secondary | ICD-10-CM | POA: Diagnosis not present

## 2017-12-04 DIAGNOSIS — M25512 Pain in left shoulder: Secondary | ICD-10-CM | POA: Diagnosis not present

## 2017-12-07 ENCOUNTER — Other Ambulatory Visit: Payer: Self-pay | Admitting: Internal Medicine

## 2017-12-10 ENCOUNTER — Other Ambulatory Visit: Payer: Self-pay | Admitting: Internal Medicine

## 2017-12-11 ENCOUNTER — Other Ambulatory Visit: Payer: Self-pay | Admitting: Internal Medicine

## 2017-12-25 DIAGNOSIS — M5412 Radiculopathy, cervical region: Secondary | ICD-10-CM | POA: Diagnosis not present

## 2017-12-27 DIAGNOSIS — M542 Cervicalgia: Secondary | ICD-10-CM | POA: Diagnosis not present

## 2018-01-03 DIAGNOSIS — M542 Cervicalgia: Secondary | ICD-10-CM | POA: Diagnosis not present

## 2018-01-04 ENCOUNTER — Other Ambulatory Visit: Payer: Self-pay

## 2018-01-04 ENCOUNTER — Emergency Department (HOSPITAL_COMMUNITY)
Admission: EM | Admit: 2018-01-04 | Discharge: 2018-01-05 | Disposition: A | Discharge status: Home / Self Care | Payer: Medicare Other | Attending: Emergency Medicine | Admitting: Emergency Medicine

## 2018-01-04 DIAGNOSIS — Z7982 Long term (current) use of aspirin: Secondary | ICD-10-CM | POA: Diagnosis not present

## 2018-01-04 DIAGNOSIS — R404 Transient alteration of awareness: Secondary | ICD-10-CM | POA: Diagnosis not present

## 2018-01-04 DIAGNOSIS — R42 Dizziness and giddiness: Secondary | ICD-10-CM | POA: Diagnosis not present

## 2018-01-04 DIAGNOSIS — S199XXA Unspecified injury of neck, initial encounter: Secondary | ICD-10-CM | POA: Diagnosis not present

## 2018-01-04 DIAGNOSIS — S3992XA Unspecified injury of lower back, initial encounter: Secondary | ICD-10-CM | POA: Diagnosis not present

## 2018-01-04 DIAGNOSIS — M546 Pain in thoracic spine: Secondary | ICD-10-CM | POA: Diagnosis not present

## 2018-01-04 DIAGNOSIS — Z471 Aftercare following joint replacement surgery: Secondary | ICD-10-CM | POA: Diagnosis not present

## 2018-01-04 DIAGNOSIS — W19XXXA Unspecified fall, initial encounter: Secondary | ICD-10-CM | POA: Insufficient documentation

## 2018-01-04 DIAGNOSIS — S0990XA Unspecified injury of head, initial encounter: Secondary | ICD-10-CM | POA: Diagnosis not present

## 2018-01-04 DIAGNOSIS — R0789 Other chest pain: Secondary | ICD-10-CM | POA: Diagnosis not present

## 2018-01-04 DIAGNOSIS — Z96643 Presence of artificial hip joint, bilateral: Secondary | ICD-10-CM | POA: Diagnosis not present

## 2018-01-04 DIAGNOSIS — R51 Headache: Secondary | ICD-10-CM | POA: Insufficient documentation

## 2018-01-04 DIAGNOSIS — M542 Cervicalgia: Secondary | ICD-10-CM | POA: Diagnosis not present

## 2018-01-04 DIAGNOSIS — M25551 Pain in right hip: Secondary | ICD-10-CM

## 2018-01-04 DIAGNOSIS — R519 Headache, unspecified: Secondary | ICD-10-CM

## 2018-01-04 DIAGNOSIS — S299XXA Unspecified injury of thorax, initial encounter: Secondary | ICD-10-CM | POA: Diagnosis not present

## 2018-01-04 DIAGNOSIS — Y92009 Unspecified place in unspecified non-institutional (private) residence as the place of occurrence of the external cause: Secondary | ICD-10-CM | POA: Diagnosis not present

## 2018-01-04 DIAGNOSIS — Z79899 Other long term (current) drug therapy: Secondary | ICD-10-CM | POA: Diagnosis not present

## 2018-01-04 DIAGNOSIS — Z96641 Presence of right artificial hip joint: Secondary | ICD-10-CM | POA: Diagnosis not present

## 2018-01-04 DIAGNOSIS — I10 Essential (primary) hypertension: Secondary | ICD-10-CM | POA: Diagnosis not present

## 2018-01-04 DIAGNOSIS — M549 Dorsalgia, unspecified: Secondary | ICD-10-CM | POA: Diagnosis not present

## 2018-01-04 LAB — CBC
HCT: 44.8 % (ref 36.0–46.0)
Hemoglobin: 14.3 g/dL (ref 12.0–15.0)
MCH: 30.5 pg (ref 26.0–34.0)
MCHC: 31.9 g/dL (ref 30.0–36.0)
MCV: 95.5 fL (ref 78.0–100.0)
Platelets: 223 10*3/uL (ref 150–400)
RBC: 4.69 MIL/uL (ref 3.87–5.11)
RDW: 14.8 % (ref 11.5–15.5)
WBC: 6.3 10*3/uL (ref 4.0–10.5)

## 2018-01-04 LAB — URINALYSIS, ROUTINE W REFLEX MICROSCOPIC
Bilirubin Urine: NEGATIVE
Glucose, UA: NEGATIVE mg/dL
Hgb urine dipstick: NEGATIVE
Ketones, ur: NEGATIVE mg/dL
Leukocytes, UA: NEGATIVE
Nitrite: NEGATIVE
Protein, ur: NEGATIVE mg/dL
Specific Gravity, Urine: 1.006 (ref 1.005–1.030)
pH: 6 (ref 5.0–8.0)

## 2018-01-04 LAB — BASIC METABOLIC PANEL
Anion gap: 13 (ref 5–15)
BUN: 14 mg/dL (ref 6–20)
CO2: 26 mmol/L (ref 22–32)
Calcium: 9.1 mg/dL (ref 8.9–10.3)
Chloride: 102 mmol/L (ref 101–111)
Creatinine, Ser: 0.87 mg/dL (ref 0.44–1.00)
GFR calc Af Amer: >60 mL/min (ref >60)
GFR calc non Af Amer: >60 mL/min (ref >60)
Glucose, Bld: 101 mg/dL — ABNORMAL HIGH (ref 65–99)
Potassium: 3.6 mmol/L (ref 3.5–5.1)
Sodium: 141 mmol/L (ref 135–145)

## 2018-01-04 NOTE — ED Triage Notes (Addendum)
Patient was walking dog and got tangled in leash and fell. Patient has chronic back pain which has increased since fall. Also c/o HA, dizziness, neck pain, shoulder pain and right hip pain since the fall. Denies LOC.  Patient reports multiple falls.

## 2018-01-05 ENCOUNTER — Emergency Department (HOSPITAL_COMMUNITY): Payer: Medicare Other

## 2018-01-05 DIAGNOSIS — Z96641 Presence of right artificial hip joint: Secondary | ICD-10-CM | POA: Diagnosis not present

## 2018-01-05 DIAGNOSIS — M549 Dorsalgia, unspecified: Secondary | ICD-10-CM | POA: Diagnosis not present

## 2018-01-05 DIAGNOSIS — R51 Headache: Secondary | ICD-10-CM | POA: Diagnosis not present

## 2018-01-05 DIAGNOSIS — S0990XA Unspecified injury of head, initial encounter: Secondary | ICD-10-CM | POA: Diagnosis not present

## 2018-01-05 DIAGNOSIS — S199XXA Unspecified injury of neck, initial encounter: Secondary | ICD-10-CM | POA: Diagnosis not present

## 2018-01-05 DIAGNOSIS — M542 Cervicalgia: Secondary | ICD-10-CM | POA: Diagnosis not present

## 2018-01-05 DIAGNOSIS — S3992XA Unspecified injury of lower back, initial encounter: Secondary | ICD-10-CM | POA: Diagnosis not present

## 2018-01-05 DIAGNOSIS — Z471 Aftercare following joint replacement surgery: Secondary | ICD-10-CM | POA: Diagnosis not present

## 2018-01-05 DIAGNOSIS — S299XXA Unspecified injury of thorax, initial encounter: Secondary | ICD-10-CM | POA: Diagnosis not present

## 2018-01-05 MED ORDER — DIAZEPAM 5 MG PO TABS
5.0000 mg | ORAL_TABLET | Freq: Once | ORAL | Status: AC
  Administered 2018-01-05: 5 mg via ORAL
  Filled 2018-01-05: qty 1

## 2018-01-05 MED ORDER — KETOROLAC TROMETHAMINE 15 MG/ML IJ SOLN
30.0000 mg | Freq: Once | INTRAMUSCULAR | Status: AC
  Administered 2018-01-05: 30 mg via INTRAMUSCULAR
  Filled 2018-01-05: qty 2

## 2018-01-05 NOTE — ED Provider Notes (Signed)
[facility] EMERGENCY DEPARTMENT Provider Note   CSN: [id number] Arrival date & time: 01/04/18  2119  Time seen 2:20 AM   History   Chief Complaint Chief Complaint  Patient presents with  . Fall    HPI Kathy [name] is a 67 y.o. female.  HPI patient states about 8 PM she was walking her puppy outside to use the bathroom and the puppy got her leash tangled up and the patient fell flat backwards.  She states she hit the back of her head.  She states now she has a diffuse headache, she denies visual changes, she complains of neck pain and diffuse back pain including her thoracic and lumbar spine.  She states her neck is the worst pain in her spine.  She states she had some right upper tingling which she relates to the blood pressure cuff.  She states she did not have it at home.  Otherwise she denies any new numbness or tingling.  She did have nausea but no vomiting.  She does have chronic back pain.  She states she has bilateral shoulder pain and she is already seeing Dr. Alma Friendly about that and is getting physical therapy that she has had to sessions for her shoulder pain but now it is worse.  She has had bilateral total hip replacement states her right hip is painful since she fell. She also complains of bilateral rib cage in the front when she breathes.  Patient states she has chronic pain and fibromyalgia.  She takes Flexeril and ibuprofen for it.  PCP Marletta Lor, MD Rheumatology Dr Jearld Pies Dr Maureen Ralphs and Veverly Fells  Past Medical History:  Diagnosis Date  . Adenomatous colon polyp    2012  . Anxiety   . Anxiety and depression   . Arthritis    Osteoarthritis, Dr Maureen Ralphs  . Depression   . Diverticulosis   . Fibromyalgia    Dr Tobie Lords, Rheu  . GERD (gastroesophageal reflux disease)   . H/O hiatal hernia   . Headache(784.0)    PAST HX MILD MIGRAINES  . Heart murmur    mitral valve prolapse--no symptoms  . History of colonic polyps  08/08/2011       . HTN (hypertension)   . IBS (irritable bowel syndrome)   . Internal hemorrhoid   . Lumbar radiculopathy   . Obesity   . Syncope    idiopathic, ? related to migraines or hyperventilation. Dr Leonie Man, Neurology,  ONE TIME EPISODE AND THIS WAS YEARS AGO    Patient Active Problem List   Diagnosis Date Noted  . Difficulty walking 02/13/2017  . Low back pain 02/13/2017  . Allergic rhinitis 07/31/2016  . Malignant melanoma ([place]) 08/31/2013  . Other abnormal glucose 01/22/2013  . Anemia, unspecified 01/22/2013  . OA (osteoarthritis) of hip 03/18/2012  . Personal history of DVT (deep vein thrombosis) 01/05/2012  . History of colonic polyps 08/08/2011  . ARTHRITIS, HIP 01/24/2010  . PERSONAL HISTORY OF FAILED MODERATE SEDATION 07/21/2009  . Obesity 04/05/2009  . Esophageal reflux 02/25/2009  . HEART VALVE DISEASE 12/28/2008  . HEADACHE 04/21/2008  . Essential hypertension 10/15/2007  . LUMBAR RADICULOPATHY, RIGHT 10/15/2007  . FATIGUE 10/15/2007  . IRRITABLE BOWEL SYNDROME 07/23/2007  . FIBROMYALGIA 07/23/2007    Past Surgical History:  Procedure Laterality Date  . ABDOMINAL HYSTERECTOMY     dysfunctional menses  . APPENDECTOMY    . BREAST EXCISIONAL BIOPSY Right   . CHOLECYSTECTOMY    . COLONOSCOPY  05/27/2002, 08/08/11   2003 diverticulosis, hemorrhoids 2012 same + cecal polyp  . KNEE ARTHROSCOPY  2008   right  . MELANOMA EXCISION Left 07/2013   foot  . TOTAL HIP ARTHROPLASTY  02/2010   left ; Dr Maureen Ralphs  . TOTAL HIP ARTHROPLASTY  03/18/2012   Procedure: TOTAL HIP ARTHROPLASTY;  Surgeon: Gearlean Alf, MD;  Location: [facility];  Service: Orthopedics;  Laterality: Right;  . UPPER GASTROINTESTINAL ENDOSCOPY  04/21/2009, 08/08/11   2010 gastritis ;2012 small hiatal hernia. Dr Carlean Purl     OB History   None      Home Medications    Prior to Admission medications   Medication Sig Start Date End Date Taking? Authorizing Provider  albuterol (PROVENTIL  HFA;VENTOLIN HFA) 108 (90 Base) MCG/ACT inhaler Inhale 2 puffs into the lungs every 6 (six) hours as needed for wheezing or shortness of breath. 02/16/17  Yes Lucretia Kern, DO  ALPRAZolam (XANAX) 1 MG tablet TAKE 1 TABLET BY MOUTH 4 TIMES A DAY Patient taking differently: TAKE 1 TABLET By mouth four times a day as needed 12/11/17  Yes Marletta Lor, MD  aspirin 325 MG tablet Take 325 mg by mouth daily.   Yes [provider]  CALCIUM PO Take 1,000 mg by mouth daily.    Yes [provider]  clobetasol cream (TEMOVATE) 1.93 % Apply 1 application topically as needed.  09/25/13  Yes [provider]  cyclobenzaprine (FLEXERIL) 10 MG tablet Take 1 tablet (10 mg total) by mouth 4 (four) times daily as needed. SPASMS 04/09/15  Yes Marletta Lor, MD  famotidine-calcium carbonate-magnesium hydroxide (PEPCID COMPLETE) 10-800-165 MG CHEW chewable tablet 1 -2 tablets as needed to prevent or treat heartburn Patient taking differently: Chew 1-2 tablets by mouth daily as needed (heartburn).  08/20/13  Yes Gatha Mayer, MD  FLUoxetine (PROZAC) 40 MG capsule TAKE ONE CAPSULE BY MOUTH EVERY DAY Patient taking differently: TAKE ONE CAPSULE BY MOUTH EVERY evening 10/30/17  Yes Marletta Lor, MD  fluticasone [facility]) 50 MCG/ACT nasal spray Place 2 sprays into both nostrils daily. Patient taking differently: Place 2 sprays into both nostrils as needed.  07/31/16  Yes Marletta Lor, MD  HYDROcodone-acetaminophen (NORCO/VICODIN) 5-325 MG tablet Take 1 tablet by mouth every 6 (six) hours as needed. for pain 11/21/17  Yes [provider]  ibuprofen (ADVIL,MOTRIN) 800 MG tablet Take 800 mg by mouth every 8 (eight) hours as needed. pain   Yes [provider]  meclizine (ANTIVERT) 25 MG tablet Take 1 tablet (25 mg total) by mouth 3 (three) times daily as needed for dizziness. 04/09/15  Yes Marletta Lor, MD  metoprolol succinate (TOPROL-XL) 100 MG 24 hr  tablet TAKE 1 TABLET DAILY WITH OR IMMEDIATELY FOLLOWING A MEAL 12/07/17  Yes Marletta Lor, MD  Multiple Vitamin (MULTIVITAMIN) tablet Take 1 tablet by mouth daily.   Yes [provider]  olmesartan (BENICAR) 40 MG tablet TAKE 1 TABLET BY MOUTH EVERY DAY 12/10/17  Yes Marletta Lor, MD  omeprazole (PRILOSEC) 40 MG capsule TAKE 1 CAPSULE BY MOUTH EVERY DAY 08/06/17  Yes Marletta Lor, MD    Family History Family History  Problem Relation Age of Onset  . Heart attack Mother 55  . COPD Mother   . Breast cancer Mother 5  . Hypertension Father   . Diabetes Father   . Heart failure Father 3  . Breast cancer Paternal Aunt  cns mets  . COPD Maternal Grandmother   . Heart disease Paternal Grandmother        died 79  . Diabetes Paternal Grandmother   . Stroke Paternal Grandfather        in late 47s  . Heart attack Brother 50  . Colon cancer Neg Hx     Social History Social History   Tobacco Use  . Smoking status: Never Smoker  . Smokeless tobacco: Never Used  Substance Use Topics  . Alcohol use: Yes    Comment: occasional  . Drug use: No  Retired Lives with spouse   Allergies   Penicillins; Sulfamethoxazole-trimethoprim; Achromycin [tetracycline hcl]; Nickel; and Zanaflex [tizanidine hydrochloride]   Review of Systems Review of Systems  All other systems reviewed and are negative.    Physical Exam Updated Vital Signs BP 131/86   Pulse 70   Temp 98.9 F (37.2 C) (Oral)   Resp 17   Ht 5' 6.5" (1.689 m)   Wt 126.1 kg (278 lb)   SpO2 94%   BMI 44.20 kg/m   Vital signs normal    Physical Exam  Constitutional: She is oriented to person, place, and time. She appears well-developed and well-nourished.  Non-toxic appearance. She does not appear ill. No distress.  obese  HENT:  Head: Normocephalic and atraumatic.  Right Ear: External ear normal.  Left Ear: External ear normal.  Nose: Nose normal. No mucosal edema or  rhinorrhea.  Mouth/Throat: Oropharynx is clear and moist and mucous membranes are normal. No dental abscesses or uvula swelling.  Eyes: Pupils are equal, round, and reactive to light. Conjunctivae and EOM are normal.  Neck: Full passive range of motion without pain.  C-collar in place, when I examine her underneath the collar she is tender diffusely without localization  Cardiovascular: Normal rate, regular rhythm and normal heart sounds. Exam reveals no gallop and no friction rub.  No murmur heard. Pulmonary/Chest: Effort normal and breath sounds normal. No respiratory distress. She has no wheezes. She has no rhonchi. She has no rales. She exhibits no tenderness and no crepitus.  Her anterior rib cage is tender bilaterally without localization or crepitance  Abdominal: Soft. Normal appearance and bowel sounds are normal. She exhibits no distension. There is no tenderness. There is no rebound and no guarding.  Musculoskeletal: Normal range of motion. She exhibits no edema or tenderness.  Patient has diffuse tenderness of her thoracic and lumbar spine without step-offs or localization.  There is no bruising or abrasions seen to her back.  She states the worst pain is between her shoulder blades. Patient complains of pain in her right posterior hip and she has good flexion without pain in the true hip joint.  She is concerned that her prosthesis is disrupted.  Neurological: She is alert and oriented to person, place, and time. She has normal strength. No cranial nerve deficit.  Skin: Skin is warm, dry and intact. No rash noted. No erythema. No pallor.  Psychiatric: She has a normal mood and affect. Her speech is normal and behavior is normal. Her mood appears not anxious.  Nursing note and vitals reviewed.    ED Treatments / Results  Labs (all labs ordered are listed, but only abnormal results are displayed) Results for orders placed or performed during the hospital encounter of 78/93/81  Basic  metabolic panel  Result Value Ref Range   Sodium 141 135 - 145 mmol/L   Potassium 3.6 3.5 - 5.1 mmol/L   Chloride  102 101 - 111 mmol/L   CO2 26 22 - 32 mmol/L   Glucose, Bld 101 (H) 65 - 99 mg/dL   BUN 14 6 - 20 mg/dL   Creatinine, Ser 0.87 0.44 - 1.00 mg/dL   Calcium 9.1 8.9 - 10.3 mg/dL   GFR calc non Af Amer >60 >60 mL/min   GFR calc Af Amer >60 >60 mL/min   Anion gap 13 5 - 15  CBC  Result Value Ref Range   WBC 6.3 4.0 - 10.5 K/uL   RBC 4.69 3.87 - 5.11 MIL/uL   Hemoglobin 14.3 12.0 - 15.0 g/dL   HCT 44.8 36.0 - 46.0 %   MCV 95.5 78.0 - 100.0 fL   MCH 30.5 26.0 - 34.0 pg   MCHC 31.9 30.0 - 36.0 g/dL   RDW 14.8 11.5 - 15.5 %   Platelets 223 150 - 400 K/uL  Urinalysis, Routine w reflex microscopic  Result Value Ref Range   Color, Urine YELLOW YELLOW   APPearance CLEAR CLEAR   Specific Gravity, Urine 1.006 1.005 - 1.030   pH 6.0 5.0 - 8.0   Glucose, UA NEGATIVE NEGATIVE mg/dL   Hgb urine dipstick NEGATIVE NEGATIVE   Bilirubin Urine NEGATIVE NEGATIVE   Ketones, ur NEGATIVE NEGATIVE mg/dL   Protein, ur NEGATIVE NEGATIVE mg/dL   Nitrite NEGATIVE NEGATIVE   Leukocytes, UA NEGATIVE NEGATIVE   Laboratory interpretation all normal  EKG None  Radiology Dg Ribs Bilateral W/chest  Result Date: 01/05/2018 CLINICAL DATA:  Initial evaluation for acute trauma, fall. EXAM: BILATERAL RIBS AND CHEST - 4+ VIEW COMPARISON:  Prior radiograph from 02/12/2017. FINDINGS: Mild cardiomegaly, stable. Mediastinal silhouette within normal limits. Aortic atherosclerosis. Lungs mildly hypoinflated. No focal infiltrates. Mild diffuse pulmonary vascular congestion without frank pulmonary edema. No pleural effusion. No pneumothorax. Dedicated views of the bilateral ribs are performed. No acute displaced rib fracture identified. No other acute osseous abnormality. IMPRESSION: 1. No acute displaced rib fracture identified. 2. Stable cardiomegaly with mild diffuse pulmonary vascular congestion without  frank pulmonary edema. 3. No other active cardiopulmonary disease. Electronically Signed   By: Jeannine Boga M.D.   On: 01/05/2018 03:52   Dg Thoracic Spine W/swimmers  Result Date: 01/05/2018 CLINICAL DATA:  Initial evaluation for acute trauma, fall. EXAM: THORACIC SPINE - 3 VIEWS COMPARISON:  None. FINDINGS: Vertebral bodies normally aligned with preservation of the normal thoracic kyphosis. No listhesis or malalignment. Vertebral body heights maintained. No acute fracture. Moderate to advanced multilevel degenerative spurring seen throughout the mid and lower thoracic spine. Visualized heart and lungs demonstrate no acute process. IMPRESSION: No radiographic evidence for acute abnormality within the thoracic spine. Electronically Signed   By: Jeannine Boga M.D.   On: 01/05/2018 03:36   Dg Lumbar Spine 2-3 Views  Result Date: 01/05/2018 CLINICAL DATA:  Initial evaluation for acute trauma, fall. EXAM: LUMBAR SPINE - 2-3 VIEW COMPARISON:  Prior CT from 02/12/2017. FINDINGS: Five non rib-bearing lumbar type vertebral bodies are present. Vertebral bodies normally aligned with preservation of the normal lumbar lordosis. Vertebral body heights maintained. No acute fracture or subluxation. Visualized sacrum intact. No acute soft tissue abnormality. Cholecystectomy clips noted. Prominent vascular calcifications noted within the intra-abdominal aorta. Bilateral hip arthroplasties partially visualized. IMPRESSION: No radiographic evidence for acute abnormality within the lumbar spine. Electronically Signed   By: Jeannine Boga M.D.   On: 01/05/2018 03:38   Ct Head Wo Contrast  Result Date: 01/05/2018 CLINICAL DATA:  Status post fall while walking dog, with  headache and dizziness. Neck pain. Initial encounter. EXAM: CT HEAD WITHOUT CONTRAST CT CERVICAL SPINE WITHOUT CONTRAST TECHNIQUE: Multidetector CT imaging of the head and cervical spine was performed following the standard protocol without  intravenous contrast. Multiplanar CT image reconstructions of the cervical spine were also generated. COMPARISON:  CT of the head and cervical spine performed 02/12/2017 FINDINGS: CT HEAD FINDINGS Brain: No evidence of acute infarction, hemorrhage, hydrocephalus, extra-axial collection or mass lesion / mass effect. Prominence of the ventricles and sulci reflects mild cortical volume loss. Cerebellar atrophy is noted. Mild periventricular white matter change likely reflects small vessel ischemic microangiopathy. The brainstem and fourth ventricle are within normal limits. The basal ganglia are unremarkable in appearance. The cerebral hemispheres demonstrate grossly normal gray-white differentiation. No mass effect or midline shift is seen. Vascular: No hyperdense vessel or unexpected calcification. Skull: There is no evidence of fracture; visualized osseous structures are unremarkable in appearance. Sinuses/Orbits: The visualized portions of the orbits are within normal limits. The paranasal sinuses and mastoid air cells are well-aerated. Other: No significant soft tissue abnormalities are seen. CT CERVICAL SPINE FINDINGS Alignment: Normal. Skull base and vertebrae: No acute fracture. No primary bone lesion or focal pathologic process. Soft tissues and spinal canal: No prevertebral fluid or swelling. No visible canal hematoma. Disc levels: Minimal intervertebral disc space narrowing is noted along the cervical spine, with scattered anterior and posterior disc osteophyte complexes. Upper chest: The visualized lung apices are clear. The thyroid gland is unremarkable. Other: No additional soft tissue abnormalities are seen. IMPRESSION: 1. No evidence of traumatic intracranial injury or fracture. 2. No evidence of fracture or subluxation along the cervical spine. 3. Mild cortical volume loss and scattered small vessel ischemic microangiopathy. 4. Mild degenerative change along the cervical spine. Electronically Signed    By: Garald Balding M.D.   On: 01/05/2018 04:02   Ct Cervical Spine Wo Contrast  Result Date: 01/05/2018 CLINICAL DATA:  Status post fall while walking dog, with headache and dizziness. Neck pain. Initial encounter. EXAM: CT HEAD WITHOUT CONTRAST CT CERVICAL SPINE WITHOUT CONTRAST TECHNIQUE: Multidetector CT imaging of the head and cervical spine was performed following the standard protocol without intravenous contrast. Multiplanar CT image reconstructions of the cervical spine were also generated. COMPARISON:  CT of the head and cervical spine performed 02/12/2017 FINDINGS: CT HEAD FINDINGS Brain: No evidence of acute infarction, hemorrhage, hydrocephalus, extra-axial collection or mass lesion / mass effect. Prominence of the ventricles and sulci reflects mild cortical volume loss. Cerebellar atrophy is noted. Mild periventricular white matter change likely reflects small vessel ischemic microangiopathy. The brainstem and fourth ventricle are within normal limits. The basal ganglia are unremarkable in appearance. The cerebral hemispheres demonstrate grossly normal gray-white differentiation. No mass effect or midline shift is seen. Vascular: No hyperdense vessel or unexpected calcification. Skull: There is no evidence of fracture; visualized osseous structures are unremarkable in appearance. Sinuses/Orbits: The visualized portions of the orbits are within normal limits. The paranasal sinuses and mastoid air cells are well-aerated. Other: No significant soft tissue abnormalities are seen. CT CERVICAL SPINE FINDINGS Alignment: Normal. Skull base and vertebrae: No acute fracture. No primary bone lesion or focal pathologic process. Soft tissues and spinal canal: No prevertebral fluid or swelling. No visible canal hematoma. Disc levels: Minimal intervertebral disc space narrowing is noted along the cervical spine, with scattered anterior and posterior disc osteophyte complexes. Upper chest: The visualized lung  apices are clear. The thyroid gland is unremarkable. Other: No additional soft tissue abnormalities  are seen. IMPRESSION: 1. No evidence of traumatic intracranial injury or fracture. 2. No evidence of fracture or subluxation along the cervical spine. 3. Mild cortical volume loss and scattered small vessel ischemic microangiopathy. 4. Mild degenerative change along the cervical spine. Electronically Signed   By: Garald Balding M.D.   On: 01/05/2018 04:02   Dg Hip Unilat With Pelvis 2-3 Views Right  Result Date: 01/05/2018 CLINICAL DATA:  Initial evaluation for acute trauma, fall. EXAM: DG HIP (WITH OR WITHOUT PELVIS) 2-3V RIGHT COMPARISON:  None. FINDINGS: Bilateral hip arthroplasties are in place. Femoral and acetabular components appear well seated without complication. No acute periprosthetic fracture. Bony pelvis intact. SI joints approximated. No acute soft tissue abnormality. IMPRESSION: 1. No acute osseous abnormality about the right hip. 2. Right total hip arthroplasty in place without complication. Electronically Signed   By: Jeannine Boga M.D.   On: 01/05/2018 03:42    Procedures Procedures (including critical care time)  Medications Ordered in ED Medications  ketorolac (TORADOL) 15 MG/ML injection 30 mg (30 mg Intramuscular Given 01/05/18 0342)  diazepam (VALIUM) tablet 5 mg (5 mg Oral Given 01/05/18 0342)     Initial Impression / Assessment and Plan / ED Course  I have reviewed the triage vital signs and the nursing notes.  Pertinent labs & imaging results that were available during my care of the patient were reviewed by me and considered in my medical decision making (see chart for details).   Patient presents with multiple complaints with acute on chronic pain that she has had before.  All the places that were complained about were x-rayed her CT.  She was given Toradol and oral Valium for her pain.  I removed patient's C collar after reviewing her scans. I gave her husband  and patient the results of her radiology studies.  We discussed this fall may have her more fibromyalgia flared up.  She can continue her Flexeril and her ibuprofen.  She can use ice and heat for comfort.  Patient is already going to physical therapy for her shoulder pain.  I was not comfortable putting patient on anything stronger for her pain, she is also on a large number of alprazolam daily.  She does not have any fractures so did not feel narcotics were indicated.  Review of the [place] shows patient gets #60 alprazolam 1 mg tablets monthly through July 2018 and then it was increased to number 90 tablets monthly through July 09, 2017 and then increase to number 120 tablets from November 2 current, last filled March 26.  She received #30 hydrocodone on March 6 for Leafy Kindle, the others were from her primary care doctor.  Final Clinical Impressions(s) / ED Diagnoses   Final diagnoses:  Fall  Fall in home, initial encounter  Nonintractable headache, unspecified chronicity pattern, unspecified headache type  Acute midline back pain, unspecified back location  Chest wall pain  Posterior pain of right hip    ED Discharge Orders    None     Plan discharge  Rolland Porter, MD, Barbette Or, MD 01/05/18 [id number]

## 2018-01-05 NOTE — Discharge Instructions (Addendum)
Use ice and heat for comfort. Continue your flexeril and ibuprofen for pain. Return to the ED for any problems listed on the head injury sheet. Otherwise follow up with your doctor if you continue to have pain after the next week.

## 2018-01-08 ENCOUNTER — Other Ambulatory Visit: Payer: Self-pay | Admitting: Internal Medicine

## 2018-01-08 DIAGNOSIS — M542 Cervicalgia: Secondary | ICD-10-CM | POA: Diagnosis not present

## 2018-01-11 ENCOUNTER — Other Ambulatory Visit: Payer: Self-pay

## 2018-01-11 MED ORDER — OLMESARTAN MEDOXOMIL 40 MG PO TABS: 40.0000 mg | ORAL_TABLET | Freq: Every day | ORAL | 0 refills | Status: DC

## 2018-02-06 ENCOUNTER — Other Ambulatory Visit: Payer: Self-pay | Admitting: Internal Medicine

## 2018-02-06 DIAGNOSIS — H903 Sensorineural hearing loss, bilateral: Secondary | ICD-10-CM | POA: Diagnosis not present

## 2018-02-07 NOTE — Telephone Encounter (Signed)
Last OV 10/09/2017 & last filled 01/09/2018

## 2018-02-07 NOTE — Telephone Encounter (Signed)
Okay for refill?  

## 2018-02-08 ENCOUNTER — Other Ambulatory Visit: Payer: Self-pay

## 2018-02-08 MED ORDER — OMEPRAZOLE 40 MG PO CPDR: DELAYED_RELEASE_CAPSULE | ORAL | 3 refills | Status: DC

## 2018-02-08 MED ORDER — ALPRAZOLAM 1 MG PO TABS: 1.0000 mg | ORAL_TABLET | Freq: Four times a day (QID) | ORAL | 0 refills | Status: DC

## 2018-02-22 DIAGNOSIS — M25512 Pain in left shoulder: Secondary | ICD-10-CM | POA: Diagnosis not present

## 2018-02-22 DIAGNOSIS — M542 Cervicalgia: Secondary | ICD-10-CM | POA: Diagnosis not present

## 2018-02-22 DIAGNOSIS — M25511 Pain in right shoulder: Secondary | ICD-10-CM | POA: Diagnosis not present

## 2018-02-28 DIAGNOSIS — M542 Cervicalgia: Secondary | ICD-10-CM | POA: Diagnosis not present

## 2018-03-08 DIAGNOSIS — M25511 Pain in right shoulder: Secondary | ICD-10-CM | POA: Diagnosis not present

## 2018-03-08 DIAGNOSIS — M25512 Pain in left shoulder: Secondary | ICD-10-CM | POA: Diagnosis not present

## 2018-03-10 ENCOUNTER — Other Ambulatory Visit: Payer: Self-pay | Admitting: Internal Medicine

## 2018-03-12 DIAGNOSIS — M25512 Pain in left shoulder: Secondary | ICD-10-CM | POA: Diagnosis not present

## 2018-03-12 DIAGNOSIS — M25511 Pain in right shoulder: Secondary | ICD-10-CM | POA: Diagnosis not present

## 2018-03-12 DIAGNOSIS — M542 Cervicalgia: Secondary | ICD-10-CM | POA: Diagnosis not present

## 2018-04-02 ENCOUNTER — Other Ambulatory Visit: Payer: Self-pay | Admitting: Internal Medicine

## 2018-04-09 ENCOUNTER — Other Ambulatory Visit: Payer: Self-pay | Admitting: Internal Medicine

## 2018-04-09 NOTE — Telephone Encounter (Signed)
Last fill 03/11/18 Last OV 10/09/17 Ok to fill?

## 2018-04-30 ENCOUNTER — Ambulatory Visit (INDEPENDENT_AMBULATORY_CARE_PROVIDER_SITE_OTHER): Payer: Medicare Other | Admitting: Internal Medicine

## 2018-04-30 ENCOUNTER — Encounter: Payer: Self-pay | Admitting: Internal Medicine

## 2018-04-30 VITALS — BP 120/60 | Temp 97.7°F | Wt 286.4 lb

## 2018-04-30 DIAGNOSIS — M545 Low back pain: Secondary | ICD-10-CM

## 2018-04-30 DIAGNOSIS — R7309 Other abnormal glucose: Secondary | ICD-10-CM | POA: Diagnosis not present

## 2018-04-30 DIAGNOSIS — D649 Anemia, unspecified: Secondary | ICD-10-CM

## 2018-04-30 DIAGNOSIS — G8929 Other chronic pain: Secondary | ICD-10-CM | POA: Diagnosis not present

## 2018-04-30 DIAGNOSIS — Z0000 Encounter for general adult medical examination without abnormal findings: Secondary | ICD-10-CM | POA: Diagnosis not present

## 2018-04-30 DIAGNOSIS — I10 Essential (primary) hypertension: Secondary | ICD-10-CM

## 2018-04-30 DIAGNOSIS — M160 Bilateral primary osteoarthritis of hip: Secondary | ICD-10-CM | POA: Diagnosis not present

## 2018-04-30 DIAGNOSIS — E785 Hyperlipidemia, unspecified: Secondary | ICD-10-CM

## 2018-04-30 DIAGNOSIS — J301 Allergic rhinitis due to pollen: Secondary | ICD-10-CM | POA: Diagnosis not present

## 2018-04-30 DIAGNOSIS — E669 Obesity, unspecified: Secondary | ICD-10-CM | POA: Diagnosis not present

## 2018-04-30 DIAGNOSIS — Z86010 Personal history of colonic polyps: Secondary | ICD-10-CM

## 2018-04-30 LAB — COMPREHENSIVE METABOLIC PANEL
ALT: 48 U/L — ABNORMAL HIGH (ref 0–35)
AST: 65 U/L — ABNORMAL HIGH (ref 0–37)
Albumin: 4.4 g/dL (ref 3.5–5.2)
Alkaline Phosphatase: 86 U/L (ref 39–117)
BUN: 19 mg/dL (ref 6–23)
CO2: 33 mEq/L — ABNORMAL HIGH (ref 19–32)
Calcium: 9.9 mg/dL (ref 8.4–10.5)
Chloride: 98 mEq/L (ref 96–112)
Creatinine, Ser: 1.11 mg/dL (ref 0.40–1.20)
GFR: 52.21 mL/min — ABNORMAL LOW (ref >60.00)
Glucose, Bld: 140 mg/dL — ABNORMAL HIGH (ref 70–99)
Potassium: 4 mEq/L (ref 3.5–5.1)
Sodium: 140 mEq/L (ref 135–145)
Total Bilirubin: 1.2 mg/dL (ref 0.2–1.2)
Total Protein: 7.1 g/dL (ref 6.0–8.3)

## 2018-04-30 LAB — CBC WITH DIFFERENTIAL/PLATELET
Basophils Absolute: 0 10*3/uL (ref 0.0–0.1)
Basophils Relative: 0.3 % (ref 0.0–3.0)
Eosinophils Absolute: 0.2 10*3/uL (ref 0.0–0.7)
Eosinophils Relative: 2.7 % (ref 0.0–5.0)
HCT: 46.9 % — ABNORMAL HIGH (ref 36.0–46.0)
Hemoglobin: 15.7 g/dL — ABNORMAL HIGH (ref 12.0–15.0)
Lymphocytes Relative: 19.5 % (ref 12.0–46.0)
Lymphs Abs: 1.1 10*3/uL (ref 0.7–4.0)
MCHC: 33.5 g/dL (ref 30.0–36.0)
MCV: 95.4 fl (ref 78.0–100.0)
Monocytes Absolute: 0.4 10*3/uL (ref 0.1–1.0)
Monocytes Relative: 7 % (ref 3.0–12.0)
Neutro Abs: 4.1 10*3/uL (ref 1.4–7.7)
Neutrophils Relative %: 70.5 % (ref 43.0–77.0)
Platelets: 240 10*3/uL (ref 150.0–400.0)
RBC: 4.91 Mil/uL (ref 3.87–5.11)
RDW: 15.9 % — ABNORMAL HIGH (ref 11.5–15.5)
WBC: 5.8 10*3/uL (ref 4.0–10.5)

## 2018-04-30 LAB — LIPID PANEL
Cholesterol: 235 mg/dL — ABNORMAL HIGH (ref 0–200)
HDL: 54.7 mg/dL (ref >39.00)
LDL Cholesterol: 146 mg/dL — ABNORMAL HIGH (ref 0–99)
NonHDL: 180.23
Total CHOL/HDL Ratio: 4
Triglycerides: 169 mg/dL — ABNORMAL HIGH (ref 0.0–149.0)
VLDL: 33.8 mg/dL (ref 0.0–40.0)

## 2018-04-30 LAB — TSH: TSH: 1.72 u[IU]/mL (ref 0.35–4.50)

## 2018-04-30 NOTE — Patient Instructions (Signed)
Limit your sodium (Salt) intake    It is important that you exercise regularly, at least 20 minutes 3 to 4 times per week.  If you develop chest pain or shortness of breath seek  medical attention.  You need to lose weight.  Consider a lower calorie diet and regular exercise.  Return in 6 months for follow-up   

## 2018-04-30 NOTE — Progress Notes (Signed)
Subjective:    Patient ID: Kathy [name], female    DOB: [birth year], 67 y.o.   MRN: [id number]  HPI  67 year old patient who is seen today for annual follow-up and for a subsequent annual Medicare wellness visit.  She has a history of essential hypertension.  Additionally she has a history of arthritis and fibromyalgia. She has a history of colonic polyps.  Last colonoscopy April 2017.  She has been followed by rheumatology in the past.  She has a history of exogenous obesity. She is followed dermatology annually with a prior history of melanoma involving the dorsal aspect of the left foot She is now followed by OB/GYN every other year.  She has had a complete hysterectomy in the past  Past Medical History:  Diagnosis Date  . Adenomatous colon polyp    2012  . Anxiety   . Anxiety and depression   . Arthritis    Osteoarthritis, Dr Maureen Ralphs  . Depression   . Diverticulosis   . Fibromyalgia    Dr Tobie Lords, Rheu  . GERD (gastroesophageal reflux disease)   . H/O hiatal hernia   . Headache(784.0)    PAST HX MILD MIGRAINES  . Heart murmur    mitral valve prolapse--no symptoms  . History of colonic polyps 08/08/2011       . HTN (hypertension)   . IBS (irritable bowel syndrome)   . Internal hemorrhoid   . Lumbar radiculopathy   . Obesity   . Syncope    idiopathic, ? related to migraines or hyperventilation. Dr Leonie Man, Neurology,  ONE TIME EPISODE AND THIS WAS YEARS AGO     Social History   Socioeconomic History  . Marital status: Married    Spouse name: Not on file  . Number of children: Not on file  . Years of education: Not on file  . Highest education level: Not on file  Occupational History  . Not on file  Social Needs  . Financial resource strain: Not on file  . Food insecurity:    Worry: Not on file    Inability: Not on file  . Transportation needs:    Medical: Not on file    Non-medical: Not on file  Tobacco Use  . Smoking status: Never Smoker    . Smokeless tobacco: Never Used  Substance and Sexual Activity  . Alcohol use: Yes    Comment: occasional  . Drug use: No  . Sexual activity: Not on file  Lifestyle  . Physical activity:    Days per week: Not on file    Minutes per session: Not on file  . Stress: Not on file  Relationships  . Social connections:    Talks on phone: Not on file    Gets together: Not on file    Attends religious service: Not on file    Active member of club or organization: Not on file    Attends meetings of clubs or organizations: Not on file    Relationship status: Not on file  . Intimate partner violence:    Fear of current or ex partner: Not on file    Emotionally abused: Not on file    Physically abused: Not on file    Forced sexual activity: Not on file  Other Topics Concern  . Not on file  Social History Narrative   0 caffeine drinks     Past Surgical History:  Procedure Laterality Date  . ABDOMINAL HYSTERECTOMY     dysfunctional menses  .  APPENDECTOMY    . BREAST EXCISIONAL BIOPSY Right   . CHOLECYSTECTOMY    . COLONOSCOPY  05/27/2002, 08/08/11   2003 diverticulosis, hemorrhoids 2012 same + cecal polyp  . KNEE ARTHROSCOPY  2008   right  . MELANOMA EXCISION Left 07/2013   foot  . TOTAL HIP ARTHROPLASTY  02/2010   left ; Dr Maureen Ralphs  . TOTAL HIP ARTHROPLASTY  03/18/2012   Procedure: TOTAL HIP ARTHROPLASTY;  Surgeon: Gearlean Alf, MD;  Location: [facility];  Service: Orthopedics;  Laterality: Right;  . UPPER GASTROINTESTINAL ENDOSCOPY  04/21/2009, 08/08/11   2010 gastritis ;2012 small hiatal hernia. Dr Carlean Purl    Family History  Problem Relation Age of Onset  . Heart attack Mother 7  . COPD Mother   . Breast cancer Mother 19  . Hypertension Father   . Diabetes Father   . Heart failure Father 74  . Breast cancer Paternal Aunt        cns mets  . COPD Maternal Grandmother   . Heart disease Paternal Grandmother        died 65  . Diabetes Paternal Grandmother   . Stroke  Paternal Grandfather        in late 41s  . Heart attack Brother 32  . Colon cancer Neg Hx     Allergies  Allergen Reactions  . Penicillins     ? Facial angioedema   . Sulfamethoxazole-Trimethoprim     hives  . Achromycin [Tetracycline Hcl]     Dyspnea    . Nickel     Two hip replacement  . Zanaflex [Tizanidine Hydrochloride]     Causes more pain     Current Outpatient Medications on File Prior to Visit  Medication Sig Dispense Refill  . albuterol (PROVENTIL HFA;VENTOLIN HFA) 108 (90 Base) MCG/ACT inhaler Inhale 2 puffs into the lungs every 6 (six) hours as needed for wheezing or shortness of breath. 1 Inhaler 2  . ALPRAZolam (XANAX) 1 MG tablet TAKE 1 TABLET 4 TIMES A DAY 120 tablet 0  . aspirin 325 MG tablet Take 325 mg by mouth daily.    Marland Kitchen CALCIUM PO Take 1,000 mg by mouth daily.     . clobetasol cream (TEMOVATE) 5.40 % Apply 1 application topically as needed.     . cyclobenzaprine (FLEXERIL) 10 MG tablet Take 1 tablet (10 mg total) by mouth 4 (four) times daily as needed. SPASMS 30 tablet 5  . famotidine-calcium carbonate-magnesium hydroxide (PEPCID COMPLETE) 10-800-165 MG CHEW chewable tablet 1 -2 tablets as needed to prevent or treat heartburn (Patient taking differently: Chew 1-2 tablets by mouth daily as needed (heartburn). )    . FLUoxetine (PROZAC) 40 MG capsule TAKE ONE CAPSULE BY MOUTH EVERY DAY (Patient taking differently: TAKE ONE CAPSULE BY MOUTH EVERY evening) 90 capsule 0  . fluticasone (FLONASE) 50 MCG/ACT nasal spray Place 2 sprays into both nostrils daily. (Patient taking differently: Place 2 sprays into both nostrils as needed. ) 16 g 6  . HYDROcodone-acetaminophen (NORCO/VICODIN) 5-325 MG tablet Take 1 tablet by mouth every 6 (six) hours as needed. for pain  0  . ibuprofen (ADVIL,MOTRIN) 800 MG tablet Take 800 mg by mouth every 8 (eight) hours as needed. pain    . meclizine (ANTIVERT) 25 MG tablet Take 1 tablet (25 mg total) by mouth 3 (three) times daily as  needed for dizziness. 30 tablet 3  . metoprolol succinate (TOPROL-XL) 100 MG 24 hr tablet TAKE 1 TABLET DAILY WITH OR IMMEDIATELY FOLLOWING  A MEAL 60 tablet 1  . Multiple Vitamin (MULTIVITAMIN) tablet Take 1 tablet by mouth daily.    Marland Kitchen olmesartan (BENICAR) 40 MG tablet Take 1 tablet (40 mg total) by mouth daily. 90 tablet 0  . omeprazole (PRILOSEC) 40 MG capsule TAKE 1 CAPSULE BY MOUTH EVERY DAY 90 capsule 3   No current facility-administered medications on file prior to visit.     BP 120/60 (BP Location: Right Wrist, Patient Position: Sitting, Cuff Size: Large)   Temp 97.7 F (36.5 C) (Oral)   Wt 286 lb 6.4 oz (129.9 kg)   BMI 45.53 kg/m   Subsequent Medicare wellness visit  1. Risk factors, based on past  M,S,F history.  Cardiovascular risk factors include hypertension  2.  Physical activities: Sedentary due to obesity and also has some arthritic issues.  She does have a treadmill and exercise bike at home with limited use.  3.  Depression/mood: History of anxiety but no history of major depression  4.  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  ADL's: Independent  6.  Fall risk: Moderately high did have a fall in April 2019 resulting in a ED visit  7.  Home safety: No problems identified  8.  Height weight, and visual acuity; height and weight stable no change in visual acuity  9.  Counseling: Heart healthy diet regular exercise and modest weight loss all encouraged  10. Lab orders based on risk factors: Laboratory update will be reviewed  11. Referral : Follow-up dermatology  12. Care plan: Continue efforts at aggressive risk factor modification  13. Cognitive assessment: Alert and appropriate normal affect.  No cognitive dysfunction  14. Screening: Patient provided with a written and personalized 5-10 year screening schedule in the AVS.    15. Provider List Update: Primary care dermatology GI OB/GYN and orthopedics.   She has seen rheumatology in the past    Review of Systems  Constitutional: Negative.   HENT: Negative for congestion, dental problem, hearing loss, rhinorrhea, sinus pressure, sore throat and tinnitus.   Eyes: Negative for pain, discharge and visual disturbance.  Respiratory: Negative for cough and shortness of breath.   Cardiovascular: Negative for chest pain, palpitations and leg swelling.  Gastrointestinal: Negative for abdominal distention, abdominal pain, blood in stool, constipation, diarrhea, nausea and vomiting.  Genitourinary: Negative for difficulty urinating, dysuria, flank pain, frequency, hematuria, pelvic pain, urgency, vaginal bleeding, vaginal discharge and vaginal pain.  Musculoskeletal: Positive for arthralgias, back pain and gait problem. Negative for joint swelling.  Skin: Negative for rash.  Neurological: Negative for dizziness, syncope, speech difficulty, weakness, numbness and headaches.  Hematological: Negative for adenopathy.  Psychiatric/Behavioral: Negative for agitation, behavioral problems and dysphoric mood. The patient is not nervous/anxious.        Objective:   Physical Exam  Constitutional: She is oriented to person, place, and time. She appears well-developed and well-nourished.  HENT:  Head: Normocephalic.  Right Ear: External ear normal.  Left Ear: External ear normal.  Mouth/Throat: Oropharynx is clear and moist.  Eyes: Pupils are equal, round, and reactive to light. Conjunctivae and EOM are normal.  Neck: Normal range of motion. Neck supple. No thyromegaly present.  Cardiovascular: Normal rate, regular rhythm, normal heart sounds and intact distal pulses.  Pulmonary/Chest: Effort normal and breath sounds normal.  Abdominal: Soft. Bowel sounds are normal. She exhibits no mass. There is no tenderness.  Musculoskeletal: Normal range of motion.  Lymphadenopathy:    She  has no cervical adenopathy.  Neurological: She is alert and oriented to  person, place, and time.  Skin: Skin is warm and dry. No rash noted.  Feet slightly cool and cyanotic but full pedal pulses  Psychiatric: She has a normal mood and affect. Her behavior is normal.          Assessment & Plan:  Preventive health.  Patient declines any vaccinations Subsequent Medicare wellness visit Essential hypertension stable History of colonic polyps.  We will continue colonoscopies at 5-year intervals Chronic low back pain Exogenous obesity History of fibromyalgia  Will check updated lab Follow-up 6 months  Marletta Lor

## 2018-05-01 LAB — HEPATITIS C ANTIBODY
Hepatitis C Ab: NONREACTIVE
SIGNAL TO CUT-OFF: 0.03 (ref <1.00)

## 2018-05-12 ENCOUNTER — Other Ambulatory Visit: Payer: Self-pay | Admitting: Internal Medicine

## 2018-05-15 ENCOUNTER — Other Ambulatory Visit: Payer: Self-pay | Admitting: Internal Medicine

## 2018-05-20 ENCOUNTER — Other Ambulatory Visit: Payer: Self-pay | Admitting: Internal Medicine

## 2018-06-13 ENCOUNTER — Telehealth: Payer: Self-pay | Admitting: Internal Medicine

## 2018-06-14 NOTE — Telephone Encounter (Signed)
Pt aware to follow up with new PCP for further refills due to Dr.Kwiakowski's retiring.  

## 2018-06-14 NOTE — Telephone Encounter (Signed)
Patient calling to check status of refill. Please advise. Patient would like a call with any updates.

## 2018-06-14 NOTE — Telephone Encounter (Signed)
Okay for refill? Please advise 

## 2018-06-17 NOTE — Telephone Encounter (Signed)
Tillie Rung please call the patient.  We will go ahead and fill a 30-day supply.  However for future refills she will need to see a psychiatrist for further evaluation.  She can call  health and make an appointment

## 2018-06-18 ENCOUNTER — Other Ambulatory Visit: Payer: Self-pay | Admitting: Internal Medicine

## 2018-06-18 ENCOUNTER — Other Ambulatory Visit: Payer: Self-pay | Admitting: *Deleted

## 2018-06-18 DIAGNOSIS — I480 Paroxysmal atrial fibrillation: Secondary | ICD-10-CM

## 2018-06-18 HISTORY — DX: Paroxysmal atrial fibrillation: I48.0

## 2018-06-18 NOTE — Telephone Encounter (Signed)
This Rx has not been sent yet.  Can you please resend. Thank you!

## 2018-06-18 NOTE — Telephone Encounter (Signed)
Pt aware to follow up with new PCP for further refills due to Dr.Kwiakowski's retiring.  

## 2018-06-18 NOTE — Telephone Encounter (Signed)
Patient Name Kathy [name] Patient DOB May 28, 2051 Call Type Message Only Information Provided Reason for Call Medication Question / Request Initial Comment Caller states she is trying to get an Rx refill of Alprazolam. [facility] [place].

## 2018-06-18 NOTE — Telephone Encounter (Signed)
[facility] Call Patient Name Kathy [name] Patient DOB [birth year] Call Type Message Only Information Provided Reason for Call Request for General Office Information Initial Comment Caller states she is needing a refill on her medicine.

## 2018-06-18 NOTE — Telephone Encounter (Signed)
Can you please refill pt Alprazolam? Spoke to pt and she is aware to f/u with new PCP for further refills.

## 2018-06-19 ENCOUNTER — Other Ambulatory Visit: Payer: Self-pay | Admitting: Family Medicine

## 2018-06-19 MED ORDER — ALPRAZOLAM 1 MG PO TABS: 1.0000 mg | ORAL_TABLET | Freq: Two times a day (BID) | ORAL | 0 refills | Status: DC

## 2018-06-20 ENCOUNTER — Other Ambulatory Visit (INDEPENDENT_AMBULATORY_CARE_PROVIDER_SITE_OTHER): Payer: Medicare Other

## 2018-06-20 ENCOUNTER — Encounter: Payer: Self-pay | Admitting: Physician Assistant

## 2018-06-20 ENCOUNTER — Ambulatory Visit (INDEPENDENT_AMBULATORY_CARE_PROVIDER_SITE_OTHER): Payer: Medicare Other | Admitting: Physician Assistant

## 2018-06-20 VITALS — BP 142/72 | HR 84 | Ht 66.5 in | Wt 287.0 lb

## 2018-06-20 DIAGNOSIS — K589 Irritable bowel syndrome without diarrhea: Secondary | ICD-10-CM | POA: Diagnosis not present

## 2018-06-20 DIAGNOSIS — R195 Other fecal abnormalities: Secondary | ICD-10-CM

## 2018-06-20 LAB — HIGH SENSITIVITY CRP: CRP, High Sensitivity: 12.6 mg/L — ABNORMAL HIGH (ref 0.000–5.000)

## 2018-06-20 LAB — SEDIMENTATION RATE: Sed Rate: 40 mm/hr — ABNORMAL HIGH (ref 0–30)

## 2018-06-20 MED ORDER — DICYCLOMINE HCL 10 MG PO CAPS: ORAL_CAPSULE | ORAL | 0 refills | Status: DC

## 2018-06-20 NOTE — Progress Notes (Signed)
Subjective:    Patient ID: Kathy [name], female    DOB: July 17, 1951, 67 y.o.   MRN: [id number]  HPI  Kathy [name] is a pleasant 67 year old white female known to Dr. Arelia Longest.  She has history of hypertension, IBS, GERD, fibromyalgia, osteoarthritis, history of melanoma and adenomatous colon polyps. Colonoscopy was done in April 2017 she had 2 diminutive cecal polyps removed which were tubular adenomas and also noted multiple diverticuli.  EGD in 2012 was normal. She comes in today stating that she has had IBS symptoms for a long time tries to pay attention to what she is eating and stay away from her trigger foods.  Over the past 2 to 3 months she says her whole abdomen is been somewhat sore at times this feels somewhat different than her previous symptoms.  She is also occasionally having urgent stools with infrequent incontinence.  Will have anywhere from 2-6 bowel movements per day and does have occasional normal bowel movements. Over the past month she says she thinks she said to stomach "bugs" with nausea vomiting and diarrhea which resolved within a couple of days.  Occasional nausea no vomiting.  Appetite is pretty good weight has been stable though she is been trying to lose.  She not been placed on any new medications, no recent antibiotics etc.   She relates that she is under a lot of chronic stress, and she feels this usually manifests in her got.  Her husband is an alcoholic and currently her 39 year old son has moved back in with them over the past several months who also is an alcoholic. Patient has chronic anxiety and depression and has been on Prozac for 30 years as well as Xanax 4 times daily.  Her PCP has just retired and she is stressed about obtaining a new PCP and ability to get a new psychiatrist now that she is on Medicare.  Review of Systems Pertinent positive and negative review of systems were noted in the above HPI section.  All other review of systems was otherwise  negative.  Outpatient Encounter Medications as of 06/20/2018  Medication Sig  . albuterol (PROVENTIL HFA;VENTOLIN HFA) 108 (90 Base) MCG/ACT inhaler Inhale 2 puffs into the lungs every 6 (six) hours as needed for wheezing or shortness of breath.  . ALPRAZolam (XANAX) 1 MG tablet Take 1 tablet (1 mg total) by mouth 2 (two) times daily.  Marland Kitchen aspirin 325 MG tablet Take 325 mg by mouth daily.  Marland Kitchen CALCIUM PO Take 1,000 mg by mouth daily.   . clobetasol cream (TEMOVATE) 1.54 % Apply 1 application topically as needed.   . cyclobenzaprine (FLEXERIL) 10 MG tablet Take 1 tablet (10 mg total) by mouth 4 (four) times daily as needed. SPASMS  . famotidine-calcium carbonate-magnesium hydroxide (PEPCID COMPLETE) 10-800-165 MG CHEW chewable tablet 1 -2 tablets as needed to prevent or treat heartburn (Patient taking differently: Chew 1-2 tablets by mouth daily as needed (heartburn). )  . FLUoxetine (PROZAC) 40 MG capsule TAKE 1 CAPSULE BY MOUTH EVERY DAY  . fluticasone (FLONASE) 50 MCG/ACT nasal spray Place 2 sprays into both nostrils daily. (Patient taking differently: Place 2 sprays into both nostrils as needed. )  . ibuprofen (ADVIL,MOTRIN) 800 MG tablet Take 800 mg by mouth every 8 (eight) hours as needed. pain  . metoprolol succinate (TOPROL-XL) 100 MG 24 hr tablet TAKE 1 TABLET DAILY WITH OR IMMEDIATELY FOLLOWING A MEAL  . Multiple Vitamin (MULTIVITAMIN) tablet Take 1 tablet by mouth daily.  Marland Kitchen olmesartan (BENICAR)  40 MG tablet TAKE 1 TABLET BY MOUTH EVERY DAY  . omeprazole (PRILOSEC) 40 MG capsule TAKE 1 CAPSULE BY MOUTH EVERY DAY  . dicyclomine (BENTYL) 10 MG capsule Take 1 tab by mouth 2-3 times daily as needed for abdominal discomfort.  . [DISCONTINUED] HYDROcodone-acetaminophen (NORCO/VICODIN) 5-325 MG tablet Take 1 tablet by mouth every 6 (six) hours as needed. for pain  . [DISCONTINUED] meclizine (ANTIVERT) 25 MG tablet Take 1 tablet (25 mg total) by mouth 3 (three) times daily as needed for dizziness.    No facility-administered encounter medications on file as of 06/20/2018.    Allergies  Allergen Reactions  . Penicillins     ? Facial angioedema   . Sulfamethoxazole-Trimethoprim     hives  . Achromycin [Tetracycline Hcl]     Dyspnea    . Nickel     Two hip replacement  . Zanaflex [Tizanidine Hydrochloride]     Causes more pain    Patient Active Problem List   Diagnosis Date Noted  . Difficulty walking 02/13/2017  . Low back pain 02/13/2017  . Allergic rhinitis 07/31/2016  . Malignant melanoma ([place]) 08/31/2013  . Other abnormal glucose 01/22/2013  . Anemia, unspecified 01/22/2013  . OA (osteoarthritis) of hip 03/18/2012  . Personal history of DVT (deep vein thrombosis) 01/05/2012  . History of colonic polyps 08/08/2011  . ARTHRITIS, HIP 01/24/2010  . PERSONAL HISTORY OF FAILED MODERATE SEDATION 07/21/2009  . Obesity 04/05/2009  . Esophageal reflux 02/25/2009  . HEART VALVE DISEASE 12/28/2008  . HEADACHE 04/21/2008  . Essential hypertension 10/15/2007  . LUMBAR RADICULOPATHY, RIGHT 10/15/2007  . FATIGUE 10/15/2007  . IRRITABLE BOWEL SYNDROME 07/23/2007  . FIBROMYALGIA 07/23/2007   Social History   Socioeconomic History  . Marital status: Married    Spouse name: Not on file  . Number of children: Not on file  . Years of education: Not on file  . Highest education level: Not on file  Occupational History  . Not on file  Social Needs  . Financial resource strain: Not on file  . Food insecurity:    Worry: Not on file    Inability: Not on file  . Transportation needs:    Medical: Not on file    Non-medical: Not on file  Tobacco Use  . Smoking status: Never Smoker  . Smokeless tobacco: Never Used  Substance and Sexual Activity  . Alcohol use: Yes    Comment: occasional  . Drug use: No  . Sexual activity: Not on file  Lifestyle  . Physical activity:    Days per week: Not on file    Minutes per session: Not on file  . Stress: Not on file   Relationships  . Social connections:    Talks on phone: Not on file    Gets together: Not on file    Attends religious service: Not on file    Active member of club or organization: Not on file    Attends meetings of clubs or organizations: Not on file    Relationship status: Not on file  . Intimate partner violence:    Fear of current or ex partner: Not on file    Emotionally abused: Not on file    Physically abused: Not on file    Forced sexual activity: Not on file  Other Topics Concern  . Not on file  Social History Narrative   0 caffeine drinks     Ms. Emigh's family history includes Breast cancer in her paternal aunt;  Breast cancer (age of onset: 31) in her mother; COPD in her maternal grandmother and mother; Diabetes in her father and paternal grandmother; Heart attack (age of onset: 40) in her brother; Heart attack (age of onset: 37) in her mother; Heart disease in her paternal grandmother; Heart failure (age of onset: 16) in her father; Hypertension in her father; Stroke in her paternal grandfather.      Objective:    Vitals:   06/20/18 1320  BP: (!) 142/72  Pulse: 84    Physical Exam; well-developed older white female in no acute distress, pleasant blood pressure 142/72 pulse 84, BMI 45, weight 287.  HEENT; nontraumatic normocephalic EOMI PERRLA sclera anicteric buccal mucosa moist, Cardiovascular; regular rate and rhythm with S1-S2, Pulmonary; clear bilaterally, Abdomen ;obese, soft there is no focal tenderness, no guarding or rebound no palpable mass or hepatosplenomegaly, bowel sounds are present, Rectal ;exam not done, Extremities; no clubbing cyanosis or edema skin warm dry, Neuro psych; patient is anxious, mood and affect appropriate alert and oriented, grossly nonfocal       Assessment & Plan:   #62 67 year old white female with 2 to 60-month history of rather generalized abdominal discomfort and soreness and some change in bowel habits with 2-6 bowel movements  per day, times urgent and loose. Patient is clearly under a lot of chronic stress and I think this is an IBS exacerbation.  She is not having diarrhea every day and therefore less likely infectious  2 chronic anxiety/depression 3 history of adenomatous colon polyps-last colonoscopy April 2017 due for follow-up 2022 For morbid obesity 5.  History of melanoma 6.  Fibromyalgia 7.  GERD stable 8.  Hypertension  Plan; continue omeprazole 40 mg p.o. daily Patient says she is not taking ibuprofen on a regular basis advised her to use this is much as possible and only use if absolutely needed. Add trial of dicyclomine 10 mg p.o. 2-3 times daily as needed. Of also given her samples of IBgard she will take 2 p.o. twice daily 30 minutes before a meal , Recent labs were done and unremarkable with the exception of minimal transaminase elevation  We will check sed rate and CRP Is encouraged to try to get established with a new psychiatrist for management of her meds and chronic anxiety and depression.  She is anticipating seeing a new PCP at the [place] office in November.  She will follow-up with her Arelia Longest or myself in a month if symptoms have not improved.  Kathy S Esterwood PA-C 06/20/2018   Cc: Marletta Lor, MD

## 2018-06-20 NOTE — Patient Instructions (Signed)
Your provider has requested that you go to the basement level for lab work before leaving today. Press "B" on the elevator. The lab is located at the first door on the left as you exit the elevator. We sent a prescription to your pharmacy for Bentyl ( dicyclomine) 10 mg.  We have given you samples of IBGARD.  You can get this at your pharmacy, [facility], [place], [place].   We made you an appointment with Dr. Silvano Rusk on 08-06-2018 at 1:45 PM.   Normal BMI (Body Mass Index- based on height and weight) is between 23 and 30. Your BMI today is Body mass index is 45.63 kg/m. Marland Kitchen Please consider follow up  regarding your BMI with your Primary Care Provider.

## 2018-06-21 NOTE — Telephone Encounter (Signed)
Pt called in to follow up. Pt wanted to make sure that provider Sherren Mocha is aware that she use to see Dr. Deliah Goody (psychiatrist) a year ago until she was advised that they no longer accepted her insurance, then Dr. Raliegh Ip agreed to just take over pt Rx for medication.    Pt says that her Rx has always been written for 4 a day. Pt says with this refill Rx was written for 2 a day instead. Pt would like to have Rx changed back to what Dr. Raliegh Ip had her on. Pt is aware and stated that she will be seeing Dr. Jerilee Hoh.

## 2018-06-21 NOTE — Telephone Encounter (Signed)
FYI

## 2018-06-24 NOTE — Telephone Encounter (Signed)
Okay to give her prescription as Dr. Raliegh Ip has written it............. 4 pills a day until she can see Dr. Jerilee Hoh.  Be sure she has an appointment

## 2018-06-25 ENCOUNTER — Emergency Department (HOSPITAL_COMMUNITY): Payer: Medicare Other

## 2018-06-25 ENCOUNTER — Inpatient Hospital Stay (HOSPITAL_COMMUNITY)
Admission: EM | Admit: 2018-06-25 | Discharge: 2018-06-27 | DRG: 280 | Disposition: A | Discharge status: Home / Self Care | Payer: Medicare Other | Attending: Family Medicine | Admitting: Family Medicine

## 2018-06-25 ENCOUNTER — Other Ambulatory Visit: Payer: Self-pay

## 2018-06-25 DIAGNOSIS — I10 Essential (primary) hypertension: Secondary | ICD-10-CM | POA: Diagnosis not present

## 2018-06-25 DIAGNOSIS — I214 Non-ST elevation (NSTEMI) myocardial infarction: Secondary | ICD-10-CM | POA: Diagnosis not present

## 2018-06-25 DIAGNOSIS — Z7982 Long term (current) use of aspirin: Secondary | ICD-10-CM

## 2018-06-25 DIAGNOSIS — Z880 Allergy status to penicillin: Secondary | ICD-10-CM

## 2018-06-25 DIAGNOSIS — I341 Nonrheumatic mitral (valve) prolapse: Secondary | ICD-10-CM | POA: Diagnosis present

## 2018-06-25 DIAGNOSIS — Z6841 Body Mass Index (BMI) 40.0 and over, adult: Secondary | ICD-10-CM

## 2018-06-25 DIAGNOSIS — I272 Pulmonary hypertension, unspecified: Secondary | ICD-10-CM | POA: Diagnosis present

## 2018-06-25 DIAGNOSIS — Z888 Allergy status to other drugs, medicaments and biological substances status: Secondary | ICD-10-CM | POA: Diagnosis not present

## 2018-06-25 DIAGNOSIS — Z8249 Family history of ischemic heart disease and other diseases of the circulatory system: Secondary | ICD-10-CM

## 2018-06-25 DIAGNOSIS — E669 Obesity, unspecified: Secondary | ICD-10-CM | POA: Diagnosis present

## 2018-06-25 DIAGNOSIS — K589 Irritable bowel syndrome without diarrhea: Secondary | ICD-10-CM | POA: Diagnosis present

## 2018-06-25 DIAGNOSIS — I4891 Unspecified atrial fibrillation: Secondary | ICD-10-CM

## 2018-06-25 DIAGNOSIS — Z85820 Personal history of malignant melanoma of skin: Secondary | ICD-10-CM | POA: Diagnosis not present

## 2018-06-25 DIAGNOSIS — I21A1 Myocardial infarction type 2: Secondary | ICD-10-CM | POA: Diagnosis present

## 2018-06-25 DIAGNOSIS — M797 Fibromyalgia: Secondary | ICD-10-CM | POA: Diagnosis present

## 2018-06-25 DIAGNOSIS — F419 Anxiety disorder, unspecified: Secondary | ICD-10-CM | POA: Diagnosis present

## 2018-06-25 DIAGNOSIS — E876 Hypokalemia: Secondary | ICD-10-CM | POA: Diagnosis not present

## 2018-06-25 DIAGNOSIS — E7800 Pure hypercholesterolemia, unspecified: Secondary | ICD-10-CM | POA: Diagnosis present

## 2018-06-25 DIAGNOSIS — I5031 Acute diastolic (congestive) heart failure: Secondary | ICD-10-CM | POA: Diagnosis present

## 2018-06-25 DIAGNOSIS — I248 Other forms of acute ischemic heart disease: Secondary | ICD-10-CM | POA: Diagnosis not present

## 2018-06-25 DIAGNOSIS — R079 Chest pain, unspecified: Secondary | ICD-10-CM

## 2018-06-25 DIAGNOSIS — Z96643 Presence of artificial hip joint, bilateral: Secondary | ICD-10-CM | POA: Diagnosis present

## 2018-06-25 DIAGNOSIS — I110 Hypertensive heart disease with heart failure: Secondary | ICD-10-CM | POA: Diagnosis present

## 2018-06-25 DIAGNOSIS — F329 Major depressive disorder, single episode, unspecified: Secondary | ICD-10-CM | POA: Diagnosis present

## 2018-06-25 DIAGNOSIS — K219 Gastro-esophageal reflux disease without esophagitis: Secondary | ICD-10-CM | POA: Diagnosis present

## 2018-06-25 DIAGNOSIS — I160 Hypertensive urgency: Secondary | ICD-10-CM | POA: Diagnosis present

## 2018-06-25 DIAGNOSIS — R7989 Other specified abnormal findings of blood chemistry: Secondary | ICD-10-CM | POA: Diagnosis not present

## 2018-06-25 DIAGNOSIS — I2510 Atherosclerotic heart disease of native coronary artery without angina pectoris: Secondary | ICD-10-CM | POA: Diagnosis present

## 2018-06-25 DIAGNOSIS — R0602 Shortness of breath: Secondary | ICD-10-CM | POA: Diagnosis not present

## 2018-06-25 DIAGNOSIS — I480 Paroxysmal atrial fibrillation: Secondary | ICD-10-CM | POA: Diagnosis present

## 2018-06-25 DIAGNOSIS — K5730 Diverticulosis of large intestine without perforation or abscess without bleeding: Secondary | ICD-10-CM | POA: Diagnosis not present

## 2018-06-25 DIAGNOSIS — R011 Cardiac murmur, unspecified: Secondary | ICD-10-CM | POA: Diagnosis present

## 2018-06-25 DIAGNOSIS — Z882 Allergy status to sulfonamides status: Secondary | ICD-10-CM

## 2018-06-25 DIAGNOSIS — F32A Depression, unspecified: Secondary | ICD-10-CM

## 2018-06-25 DIAGNOSIS — Z881 Allergy status to other antibiotic agents status: Secondary | ICD-10-CM

## 2018-06-25 DIAGNOSIS — R0789 Other chest pain: Secondary | ICD-10-CM | POA: Diagnosis not present

## 2018-06-25 DIAGNOSIS — I361 Nonrheumatic tricuspid (valve) insufficiency: Secondary | ICD-10-CM | POA: Diagnosis not present

## 2018-06-25 DIAGNOSIS — R000 Tachycardia, unspecified: Secondary | ICD-10-CM | POA: Diagnosis not present

## 2018-06-25 DIAGNOSIS — Z79899 Other long term (current) drug therapy: Secondary | ICD-10-CM

## 2018-06-25 LAB — URINALYSIS, ROUTINE W REFLEX MICROSCOPIC
Bilirubin Urine: NEGATIVE
Glucose, UA: NEGATIVE mg/dL
Hgb urine dipstick: NEGATIVE
Ketones, ur: NEGATIVE mg/dL
Leukocytes, UA: NEGATIVE
Nitrite: NEGATIVE
Protein, ur: NEGATIVE mg/dL
Specific Gravity, Urine: 1.004 — ABNORMAL LOW (ref 1.005–1.030)
pH: 8 (ref 5.0–8.0)

## 2018-06-25 LAB — RAPID URINE DRUG SCREEN, HOSP PERFORMED
Amphetamines: NOT DETECTED
Barbiturates: NOT DETECTED
Benzodiazepines: POSITIVE — AB
Cocaine: NOT DETECTED
Opiates: NOT DETECTED
Tetrahydrocannabinol: NOT DETECTED

## 2018-06-25 LAB — I-STAT TROPONIN, ED
Troponin i, poc: 0.05 ng/mL (ref 0.00–0.08)
Troponin i, poc: 0.89 ng/mL (ref 0.00–0.08)

## 2018-06-25 LAB — COMPREHENSIVE METABOLIC PANEL
ALT: 43 U/L (ref 0–44)
AST: 42 U/L — ABNORMAL HIGH (ref 15–41)
Albumin: 3.8 g/dL (ref 3.5–5.0)
Alkaline Phosphatase: 85 U/L (ref 38–126)
Anion gap: 14 (ref 5–15)
BUN: 16 mg/dL (ref 8–23)
CO2: 30 mmol/L (ref 22–32)
Calcium: 9.4 mg/dL (ref 8.9–10.3)
Chloride: 96 mmol/L — ABNORMAL LOW (ref 98–111)
Creatinine, Ser: 1.03 mg/dL — ABNORMAL HIGH (ref 0.44–1.00)
GFR calc Af Amer: >60 mL/min (ref >60)
GFR calc non Af Amer: 55 mL/min — ABNORMAL LOW (ref >60)
Glucose, Bld: 117 mg/dL — ABNORMAL HIGH (ref 70–99)
Potassium: 3.7 mmol/L (ref 3.5–5.1)
Sodium: 140 mmol/L (ref 135–145)
Total Bilirubin: 0.9 mg/dL (ref 0.3–1.2)
Total Protein: 7 g/dL (ref 6.5–8.1)

## 2018-06-25 LAB — CBC
HCT: 46.4 % — ABNORMAL HIGH (ref 36.0–46.0)
Hemoglobin: 14.1 g/dL (ref 12.0–15.0)
MCH: 29.7 pg (ref 26.0–34.0)
MCHC: 30.4 g/dL (ref 30.0–36.0)
MCV: 97.9 fL (ref 80.0–100.0)
Platelets: 209 10*3/uL (ref 150–400)
RBC: 4.74 MIL/uL (ref 3.87–5.11)
RDW: 14.2 % (ref 11.5–15.5)
WBC: 7.1 10*3/uL (ref 4.0–10.5)
nRBC: 0 % (ref 0.0–0.2)

## 2018-06-25 LAB — TSH: TSH: 2.31 u[IU]/mL (ref 0.350–4.500)

## 2018-06-25 LAB — PROTIME-INR
INR: 1.02
Prothrombin Time: 13.3 seconds (ref 11.4–15.2)

## 2018-06-25 LAB — MAGNESIUM: Magnesium: 2 mg/dL (ref 1.7–2.4)

## 2018-06-25 LAB — CBG MONITORING, ED: Glucose-Capillary: 113 mg/dL — ABNORMAL HIGH (ref 70–99)

## 2018-06-25 LAB — BRAIN NATRIURETIC PEPTIDE: B Natriuretic Peptide: 620.5 pg/mL — ABNORMAL HIGH (ref 0.0–100.0)

## 2018-06-25 LAB — TROPONIN I: Troponin I: 1.97 ng/mL (ref <0.03)

## 2018-06-25 MED ORDER — IOPAMIDOL (ISOVUE-370) INJECTION 76%
INTRAVENOUS | Status: AC
  Filled 2018-06-25: qty 100

## 2018-06-25 MED ORDER — ENOXAPARIN SODIUM 40 MG/0.4ML ~~LOC~~ SOLN: 40.0000 mg | SUBCUTANEOUS | Status: DC

## 2018-06-25 MED ORDER — IOPAMIDOL (ISOVUE-370) INJECTION 76%
100.0000 mL | Freq: Once | INTRAVENOUS | Status: AC | PRN
  Administered 2018-06-25: 100 mL via INTRAVENOUS

## 2018-06-25 MED ORDER — ASPIRIN 325 MG PO TABS
325.0000 mg | ORAL_TABLET | Freq: Every day | ORAL | Status: DC
  Administered 2018-06-26: 325 mg via ORAL
  Filled 2018-06-25: qty 1

## 2018-06-25 MED ORDER — DILTIAZEM HCL 25 MG/5ML IV SOLN
15.0000 mg | Freq: Once | INTRAVENOUS | Status: DC
  Filled 2018-06-25: qty 5

## 2018-06-25 MED ORDER — ONDANSETRON HCL 4 MG/2ML IJ SOLN
4.0000 mg | Freq: Four times a day (QID) | INTRAMUSCULAR | Status: DC | PRN
  Administered 2018-06-26: 4 mg via INTRAVENOUS
  Filled 2018-06-25: qty 2

## 2018-06-25 MED ORDER — FUROSEMIDE 10 MG/ML IJ SOLN
40.0000 mg | Freq: Once | INTRAMUSCULAR | Status: AC
  Administered 2018-06-25: 40 mg via INTRAVENOUS
  Filled 2018-06-25: qty 4

## 2018-06-25 MED ORDER — ADULT MULTIVITAMIN W/MINERALS CH
1.0000 | ORAL_TABLET | Freq: Every day | ORAL | Status: DC
  Administered 2018-06-26 – 2018-06-27 (×2): 1 via ORAL
  Filled 2018-06-25 (×2): qty 1

## 2018-06-25 MED ORDER — HYDRALAZINE HCL 20 MG/ML IJ SOLN
5.0000 mg | INTRAMUSCULAR | Status: DC | PRN
  Administered 2018-06-25 – 2018-06-26 (×2): 5 mg via INTRAVENOUS
  Filled 2018-06-25 (×2): qty 1

## 2018-06-25 MED ORDER — PANTOPRAZOLE SODIUM 40 MG PO TBEC
40.0000 mg | DELAYED_RELEASE_TABLET | Freq: Every day | ORAL | Status: DC
  Administered 2018-06-26 – 2018-06-27 (×2): 40 mg via ORAL
  Filled 2018-06-25 (×2): qty 1

## 2018-06-25 MED ORDER — ACETAMINOPHEN 325 MG PO TABS
650.0000 mg | ORAL_TABLET | ORAL | Status: DC | PRN
  Administered 2018-06-26: 650 mg via ORAL
  Filled 2018-06-25: qty 2

## 2018-06-25 MED ORDER — HEPARIN (PORCINE) IN NACL 100-0.45 UNIT/ML-% IJ SOLN
1500.0000 [IU]/h | INTRAMUSCULAR | Status: DC
  Administered 2018-06-25: 1300 [IU]/h via INTRAVENOUS
  Filled 2018-06-25: qty 250

## 2018-06-25 MED ORDER — HYDRALAZINE HCL 20 MG/ML IJ SOLN: 5.0000 mg | INTRAMUSCULAR | Status: DC | PRN

## 2018-06-25 MED ORDER — FLUTICASONE PROPIONATE 50 MCG/ACT NA SUSP: 2.0000 | Freq: Every day | NASAL | Status: DC | PRN

## 2018-06-25 MED ORDER — ALPRAZOLAM 0.5 MG PO TABS
1.0000 mg | ORAL_TABLET | Freq: Two times a day (BID) | ORAL | Status: DC
  Administered 2018-06-25 – 2018-06-27 (×4): 1 mg via ORAL
  Filled 2018-06-25: qty 2
  Filled 2018-06-25 (×2): qty 4
  Filled 2018-06-25: qty 2

## 2018-06-25 MED ORDER — IRBESARTAN 75 MG PO TABS
37.5000 mg | ORAL_TABLET | Freq: Every day | ORAL | Status: DC
  Administered 2018-06-26 – 2018-06-27 (×2): 37.5 mg via ORAL
  Filled 2018-06-25 (×2): qty 0.5

## 2018-06-25 MED ORDER — FLUOXETINE HCL 20 MG PO CAPS
40.0000 mg | ORAL_CAPSULE | Freq: Every day | ORAL | Status: DC
  Administered 2018-06-26: 40 mg via ORAL
  Filled 2018-06-25 (×2): qty 2

## 2018-06-25 MED ORDER — SODIUM CHLORIDE 0.9 % IV SOLN
INTRAVENOUS | Status: DC
  Administered 2018-06-25: 16:00:00 via INTRAVENOUS

## 2018-06-25 MED ORDER — ATORVASTATIN CALCIUM 40 MG PO TABS
40.0000 mg | ORAL_TABLET | Freq: Every day | ORAL | Status: DC
  Administered 2018-06-25 – 2018-06-26 (×2): 40 mg via ORAL
  Filled 2018-06-25: qty 1

## 2018-06-25 MED ORDER — HEPARIN BOLUS VIA INFUSION
4000.0000 [IU] | Freq: Once | INTRAVENOUS | Status: AC
  Administered 2018-06-25: 4000 [IU] via INTRAVENOUS
  Filled 2018-06-25: qty 4000

## 2018-06-25 MED ORDER — NITROGLYCERIN 0.4 MG SL SUBL: 0.4000 mg | SUBLINGUAL_TABLET | SUBLINGUAL | Status: DC | PRN

## 2018-06-25 MED ORDER — METOPROLOL SUCCINATE ER 100 MG PO TB24
100.0000 mg | ORAL_TABLET | Freq: Every day | ORAL | Status: DC
  Administered 2018-06-26 – 2018-06-27 (×3): 100 mg via ORAL
  Filled 2018-06-25 (×3): qty 1

## 2018-06-25 MED ORDER — FENTANYL CITRATE (PF) 100 MCG/2ML IJ SOLN
50.0000 ug | Freq: Once | INTRAMUSCULAR | Status: AC
  Administered 2018-06-25: 50 ug via INTRAVENOUS
  Filled 2018-06-25: qty 2

## 2018-06-25 NOTE — ED Notes (Signed)
Pt converted to sinus rhythm without medications, EKG done and given to EDP.

## 2018-06-25 NOTE — ED Notes (Signed)
Pt in CT.

## 2018-06-25 NOTE — ED Notes (Signed)
Notified Dr Ralene Bathe of critical troponin

## 2018-06-25 NOTE — ED Triage Notes (Signed)
Pt arrives via EMS from home with substernal CP radiating to her back for the last hour. Found to be in new onset A. Fib rate 120-150. Given 18g LAC, 324,ASA, 3 nitro, pain 5/10 to 4/10. 5mg  metoprolol PTA. CBG 132. Pt awake, alert, oriented x4, VSS.

## 2018-06-25 NOTE — Progress Notes (Signed)
ANTICOAGULATION CONSULT NOTE - Initial Consult  Pharmacy Consult for heparin Indication: chest pain/ACS and atrial fibrillation  Allergies  Allergen Reactions  . Penicillins     ? Facial angioedema   . Sulfamethoxazole-Trimethoprim     hives  . Achromycin [Tetracycline Hcl]     Dyspnea    . Nickel     Two hip replacement  . Zanaflex [Tizanidine Hydrochloride]     Causes more pain     Patient Measurements: Height: 5\' 7"  (170.2 cm) Weight: 290 lb 2 oz (131.6 kg) IBW/kg (Calculated) : 61.6 Heparin Dosing Weight: 90kg  Vital Signs: Temp: 97.5 F (36.4 C) (10/08 1957) Temp Source: Oral (10/08 1957) BP: 175/105 (10/08 1957) Pulse Rate: 46 (10/08 1957)  Labs: Recent Labs    06/25/18 1527  HGB 14.1  HCT 46.4*  PLT 209  LABPROT 13.3  INR 1.02  CREATININE 1.03*    Estimated Creatinine Clearance: 76 mL/min (A) (by C-G formula based on SCr of 1.03 mg/dL (H)).   Medical History: Past Medical History:  Diagnosis Date  . Adenomatous colon polyp    2012  . Anxiety   . Anxiety and depression   . Arthritis    Osteoarthritis, Dr Maureen Ralphs  . Depression   . Diverticulosis   . Fibromyalgia    Dr Tobie Lords, Rheu  . GERD (gastroesophageal reflux disease)   . H/O hiatal hernia   . Headache(784.0)    PAST HX MILD MIGRAINES  . Heart murmur    mitral valve prolapse--no symptoms  . History of colonic polyps 08/08/2011       . HTN (hypertension)   . IBS (irritable bowel syndrome)   . Internal hemorrhoid   . Lumbar radiculopathy   . Obesity   . Syncope    idiopathic, ? related to migraines or hyperventilation. Dr Leonie Man, Neurology,  ONE TIME EPISODE AND THIS WAS YEARS AGO    Assessment: 67 year old female presenting to [facility] with chest pain and found to be in new onset afib which she has since converted back to NSR.   Received orders to start IV heparin. Not on any anticoagulants prior to admit.   Goal of Therapy:  Heparin level 0.3-0.7 units/ml Monitor  platelets by anticoagulation protocol: Yes   Plan:  Give 4000 units bolus x 1 Start heparin infusion at 1300 units/hr Check anti-Xa level in 8 hours and daily while on heparin Continue to monitor H&H and platelets  Erin Hearing PharmD., BCPS Clinical Pharmacist 06/25/2018 8:39 PM

## 2018-06-25 NOTE — Consult Note (Signed)
Cardiology Consultation Note    Patient ID: Kathy [name], MRN: [id number], DOB/AGE: June 16, 1951 67 y.o. Admit date: 06/25/2018   Date of Consult: 06/25/2018 Primary Physician: Marletta Lor, MD  Reason for Consultation: N STEMI, atrial fibrillation Requesting MD: Dr. Konrad Saha  HPI: Kathy [name] is a 67 y.o. female with a history of mitral valve prolapse (mild), who presents with chest pain.  The patient was in her usual state of good health until this afternoon, while eating lunch when she noticed the onset of chest discomfort, that she initially attributed to reflux.  The discomfort progressed and ultimately radiated to the jaw back and bilateral arms.  Her chest pain was associated with mild dyspnea.  Given her progressive symptoms she called EMS.  Upon EMS arrival, she was noted to be in atrial fibrillation with heart rates in the 130s.  She got IV metoprolol and converted to sinus rhythm shortly after arrival to the ED.  He was markedly hypertensive upon presentation, with blood pressures in the 180s over 130s.  Her initial troponin was negative, but her repeat was 0.89.  The remainder of her labs were within normal limits.  Serial ECG showed no acute ischemic changes.  She had a CTA to rule out dissection, which showed no evidence of acute acute aortic pathology.  She was then admitted to the hospitalist service for further management.  Upon my interview, the patient reports that the chest pain is markedly improved at this point.  Past Medical History:  Diagnosis Date  . Adenomatous colon polyp    2012  . Anxiety   . Anxiety and depression   . Arthritis    Osteoarthritis, Dr Maureen Ralphs  . Depression   . Diverticulosis   . Fibromyalgia    Dr Tobie Lords, Rheu  . GERD (gastroesophageal reflux disease)   . H/O hiatal hernia   . Headache(784.0)    PAST HX MILD MIGRAINES  . Heart murmur    mitral valve prolapse--no symptoms  . History of colonic polyps 08/08/2011    . HTN (hypertension)   . IBS (irritable bowel syndrome)   . Internal hemorrhoid   . Lumbar radiculopathy   . Obesity   . Syncope    idiopathic, ? related to migraines or hyperventilation. Dr Leonie Man, Neurology,  ONE TIME EPISODE AND THIS WAS YEARS AGO      Surgical History:  Past Surgical History:  Procedure Laterality Date  . ABDOMINAL HYSTERECTOMY     dysfunctional menses  . APPENDECTOMY    . BREAST EXCISIONAL BIOPSY Right   . CHOLECYSTECTOMY    . COLONOSCOPY  05/27/2002, 08/08/11   2003 diverticulosis, hemorrhoids 2012 same + cecal polyp  . KNEE ARTHROSCOPY  2008   right  . MELANOMA EXCISION Left 07/2013   foot  . TOTAL HIP ARTHROPLASTY  02/2010   left ; Dr Maureen Ralphs  . TOTAL HIP ARTHROPLASTY  03/18/2012   Procedure: TOTAL HIP ARTHROPLASTY;  Surgeon: Gearlean Alf, MD;  Location: [facility];  Service: Orthopedics;  Laterality: Right;  . UPPER GASTROINTESTINAL ENDOSCOPY  04/21/2009, 08/08/11   2010 gastritis ;2012 small hiatal hernia. Dr Carlean Purl     Home Meds: Prior to Admission medications   Medication Sig Start Date End Date Taking? Authorizing Provider  albuterol (PROVENTIL HFA;VENTOLIN HFA) 108 (90 Base) MCG/ACT inhaler Inhale 2 puffs into the lungs every 6 (six) hours as needed for wheezing or shortness of breath. 02/16/17  Yes Lucretia Kern, DO  ALPRAZolam [name]) 1 MG  tablet Take 1 tablet (1 mg total) by mouth 2 (two) times daily. 06/19/18  Yes Dorena Cookey, MD  aspirin 325 MG tablet Take 325 mg by mouth daily.   Yes [provider]  CALCIUM PO Take 1,000 mg by mouth daily.    Yes [provider]  clobetasol cream (TEMOVATE) 8.52 % Apply 1 application topically as needed (Psoriasis).  09/25/13  Yes [provider]  cyclobenzaprine (FLEXERIL) 10 MG tablet Take 1 tablet (10 mg total) by mouth 4 (four) times daily as needed. SPASMS Patient taking differently: Take 10 mg by mouth 4 (four) times daily as needed for muscle spasms.  04/09/15  Yes Marletta Lor, MD  famotidine-calcium carbonate-magnesium hydroxide (PEPCID COMPLETE) 10-800-165 MG CHEW chewable tablet 1 -2 tablets as needed to prevent or treat heartburn Patient taking differently: Chew 1-2 tablets by mouth daily as needed (heartburn).  08/20/13  Yes Gatha Mayer, MD  FLUoxetine (PROZAC) 40 MG capsule TAKE 1 CAPSULE BY MOUTH EVERY DAY Patient taking differently: Take 40 mg by mouth daily.  05/21/18  Yes Marletta Lor, MD  fluticasone [facility]) 50 MCG/ACT nasal spray Place 2 sprays into both nostrils daily. Patient taking differently: Place 2 sprays into both nostrils as needed.  07/31/16  Yes Marletta Lor, MD  ibuprofen (ADVIL,MOTRIN) 800 MG tablet Take 800 mg by mouth every 8 (eight) hours as needed for moderate pain.    Yes [provider]  metoprolol succinate (TOPROL-XL) 100 MG 24 hr tablet TAKE 1 TABLET DAILY WITH OR IMMEDIATELY FOLLOWING A MEAL Patient taking differently: Take 100 mg by mouth daily.  04/02/18  Yes Marletta Lor, MD  Multiple Vitamin (MULTIVITAMIN) tablet Take 1 tablet by mouth daily.   Yes [provider]  olmesartan (BENICAR) 40 MG tablet TAKE 1 TABLET BY MOUTH EVERY DAY Patient taking differently: Take 40 mg by mouth daily.  05/13/18  Yes Marletta Lor, MD  omeprazole (PRILOSEC) 40 MG capsule TAKE 1 CAPSULE BY MOUTH EVERY DAY Patient taking differently: Take 40 mg by mouth daily.  02/08/18  Yes Marletta Lor, MD  dicyclomine (BENTYL) 10 MG capsule Take 1 tab by mouth 2-3 times daily as needed for abdominal discomfort. Patient taking differently: Take 10 mg by mouth See admin instructions. Take 1 tablet (10MG ) by mouth 2-3 times daily as needed for abdominal discomfort. 06/20/18   Alfredia Ferguson, PA-C    Inpatient Medications:  . [START ON 06/26/2018] ALPRAZolam  1 mg Oral BID  . [START ON 06/26/2018] aspirin  325 mg Oral Daily  . atorvastatin  40 mg Oral q1800  . [START ON 06/26/2018] FLUoxetine  40 mg  Oral Daily  . iopamidol      . [START ON 06/26/2018] irbesartan  37.5 mg Oral Daily  . [START ON 06/26/2018] metoprolol succinate  100 mg Oral Daily  . [START ON 06/26/2018] multivitamin with minerals  1 tablet Oral Daily  . [START ON 06/26/2018] pantoprazole  40 mg Oral Daily   . heparin 1,300 Units/hr (06/25/18 2112)    Allergies:  Allergies  Allergen Reactions  . Penicillins     ? Facial angioedema   . Sulfamethoxazole-Trimethoprim     hives  . Achromycin [Tetracycline Hcl]     Dyspnea    . Nickel     Two hip replacement  . Zanaflex [Tizanidine Hydrochloride]     Causes more pain     Social History   Socioeconomic History  . Marital status:  Married    Spouse name: Not on file  . Number of children: Not on file  . Years of education: Not on file  . Highest education level: Not on file  Occupational History  . Not on file  Social Needs  . Financial resource strain: Not on file  . Food insecurity:    Worry: Not on file    Inability: Not on file  . Transportation needs:    Medical: Not on file    Non-medical: Not on file  Tobacco Use  . Smoking status: Never Smoker  . Smokeless tobacco: Never Used  Substance and Sexual Activity  . Alcohol use: Yes    Comment: occasional  . Drug use: No  . Sexual activity: Not on file  Lifestyle  . Physical activity:    Days per week: Not on file    Minutes per session: Not on file  . Stress: Not on file  Relationships  . Social connections:    Talks on phone: Not on file    Gets together: Not on file    Attends religious service: Not on file    Active member of club or organization: Not on file    Attends meetings of clubs or organizations: Not on file    Relationship status: Not on file  . Intimate partner violence:    Fear of current or ex partner: Not on file    Emotionally abused: Not on file    Physically abused: Not on file    Forced sexual activity: Not on file  Other Topics Concern  . Not on file  Social  History Narrative   0 caffeine drinks      Family History  Problem Relation Age of Onset  . Heart attack Mother 76  . COPD Mother   . Breast cancer Mother 38  . Hypertension Father   . Diabetes Father   . Heart failure Father 33  . Breast cancer Paternal Aunt        cns mets  . COPD Maternal Grandmother   . Heart disease Paternal Grandmother        died 13  . Diabetes Paternal Grandmother   . Stroke Paternal Grandfather        in late 25s  . Heart attack Brother 29  . Colon cancer Neg Hx      Review of Systems: All other systems reviewed and are otherwise negative except as noted above.  Labs: No results for input(s): CKTOTAL, CKMB, TROPONINI in the last 72 hours. Lab Results  Component Value Date   WBC 7.1 06/25/2018   HGB 14.1 06/25/2018   HCT 46.4 (H) 06/25/2018   MCV 97.9 06/25/2018   PLT 209 06/25/2018    Recent Labs  Lab 06/25/18 1527  NA 140  K 3.7  CL 96*  CO2 30  BUN 16  CREATININE 1.03*  CALCIUM 9.4  PROT 7.0  BILITOT 0.9  ALKPHOS 85  ALT 43  AST 42*  GLUCOSE 117*   Lab Results  Component Value Date   CHOL 235 (H) 04/30/2018   HDL 54.70 04/30/2018   LDLCALC 146 (H) 04/30/2018   TRIG 169.0 (H) 04/30/2018   No results found for: DDIMER  Radiology/Studies:  Dg Chest Portable 1 View  Result Date: 06/25/2018 CLINICAL DATA:  Acute chest pain and shortness of breath today. EXAM: PORTABLE CHEST 1 VIEW COMPARISON:  01/05/2018 chest radiograph and prior studies dating back to 09/02/2004 FINDINGS: Cardiomegaly again noted. There is no evidence  of focal airspace disease, pulmonary edema, suspicious pulmonary nodule/mass, pleural effusion, or pneumothorax. No acute bony abnormalities are identified. IMPRESSION: Cardiomegaly without evidence of acute cardiopulmonary disease. Electronically Signed   By: Margarette Canada M.D.   On: 06/25/2018 15:46   Ct Angio Chest/abd/pel For Dissection W And/or W/wo  Result Date: 06/25/2018 CLINICAL DATA:  67 year old  female with acute chest and abdominal pain. New onset atrial fibrillation with tachycardia. EXAM: CT ANGIOGRAPHY CHEST, ABDOMEN AND PELVIS TECHNIQUE: Multidetector CT imaging through the chest, abdomen and pelvis was performed using the standard protocol during bolus administration of intravenous contrast. Multiplanar reconstructed images and MIPs were obtained and reviewed to evaluate the vascular anatomy. CONTRAST:  166mL ISOVUE-370 IOPAMIDOL (ISOVUE-370) INJECTION 76% COMPARISON:  06/25/2018 and prior radiographs, 02/12/2017 chest, abdomen and pelvis CTs and prior studies FINDINGS: CTA CHEST FINDINGS Cardiovascular: This is a technically satisfactory study. There is no evidence of thoracic aortic aneurysm, dissection or pulmonary emboli. Mild cardiomegaly noted. Mild thoracic aortic atherosclerotic calcifications noted. No pericardial effusion. Mediastinum/Nodes: No enlarged mediastinal, hilar, or axillary lymph nodes. Thyroid gland, trachea, and esophagus demonstrate no significant findings. Lungs/Pleura: Lungs are clear. No pleural effusion or pneumothorax. Musculoskeletal: No chest wall abnormality. No acute or significant osseous findings. Review of the MIP images confirms the above findings. CTA ABDOMEN AND PELVIS FINDINGS VASCULAR Aorta: Mild atherosclerotic calcification without aneurysm or dissection. Celiac: Mild atherosclerotic calcification at the origin without other significant abnormality SMA: Mild atherosclerotic calcification without other significant abnormality Renals: Atherosclerotic calcifications at the origins without other significant abnormality. IMA: Unremarkable Veins: No obvious venous abnormality within the limitations of this arterial phase study. Review of the MIP images confirms the above findings. NON-VASCULAR Artifact from bilateral hip arthroplasties limits evaluation of the pelvis. Hepatobiliary: The liver is unremarkable. The patient is status post cholecystectomy. No biliary  dilatation. Pancreas: Unremarkable Spleen: Unremarkable Adrenals/Urinary Tract: The kidneys, adrenal glands and visualized bladder are unremarkable except for a LEFT renal cyst. Stomach/Bowel: Stomach is within normal limits. No evidence of bowel wall thickening, distention, or inflammatory changes. Colonic diverticulosis noted without evidence of diverticulitis. Lymphatic: No abnormal lymph nodes identified. Reproductive: Not well evaluated Other: No ascites, focal collection or pneumoperitoneum. Musculoskeletal: No acute or suspicious bony abnormalities. Bilateral hip arthroplasties again noted. Review of the MIP images confirms the above findings. IMPRESSION: 1. No evidence of acute abnormality. No evidence of aortic aneurysm/dissection or pulmonary emboli. 2. Mild cardiomegaly 3.  Aortic Atherosclerosis (ICD10-I70.0). Electronically Signed   By: Margarette Canada M.D.   On: 06/25/2018 18:12    Wt Readings from Last 3 Encounters:  06/25/18 131.6 kg  06/20/18 130.2 kg  04/30/18 129.9 kg    EKG: Atrial fibrillation with RVR, followed by normal sinus rhythm.  No acute ischemic changes noted  Physical Exam: Blood pressure (!) 175/105, pulse (!) 46, temperature (!) 97.5 F (36.4 C), temperature source Oral, resp. rate (!) 23, height 5\' 7"  (1.702 m), weight 131.6 kg, SpO2 100 %. Body mass index is 45.44 kg/m. General: Well developed, well nourished, in no acute distress. Head: Normocephalic, atraumatic, sclera non-icteric, no xanthomas, nares are without discharge.  Neck: Negative for carotid bruits. JVD not elevated. Lungs: Clear bilaterally to auscultation without wheezes, rales, or rhonchi. Breathing is unlabored. Heart: RRR with S1 S2.  Soft systolic murmur, no rubs or gallops appreciated. Abdomen: Soft, non-tender, non-distended with normoactive bowel sounds. No hepatomegaly. No rebound/guarding. No obvious abdominal masses. Msk:  Strength and tone appear normal for age. Extremities: No clubbing or  cyanosis.  No edema.  Distal pedal pulses are 2+ and equal bilaterally. Neuro: Alert and oriented X 3. No facial asymmetry. No focal deficit. Moves all extremities spontaneously. Psych:  Responds to questions appropriately with a normal affect.     Assessment and Plan  67 year old female who presents with NSTEMI and rapid atrial fibrillation.  1.  NSTEMI: The patient presents with typical symptoms, however it is unclear at this point whether this represents acute plaque rupture physiology versus demand ischemia in the context of atrial fibrillation.  Would start IV heparin to treat for the former possibility.  Will consider cath tomorrow; would favor invasive strategy if troponin is sharply uptrending.  Plan to check echocardiogram in the morning.  2.  Atrial fibrillation: Converted to normal sinus rhythm following IV metoprolol.  Would start low-dose p.o. metoprolol (12.5 mg every 6 hours).  IV heparin as aforementioned, while sorting out plans for possible cath.  Ultimately, would plan to start NOAC upon discharge.  Signed, Doylene Canning, MD 06/25/2018, 10:21 PM

## 2018-06-25 NOTE — ED Provider Notes (Signed)
[facility] EMERGENCY DEPARTMENT Provider Note   CSN: [id number] Arrival date & time: 06/25/18  1413     History   Chief Complaint Chief Complaint  Patient presents with  . Chest Pain    HPI Kathy [name] is a 67 y.o. female.  HPI   67yo F with PMH significant for fibromyalgia, IBS, anxiety depression, distant melanoma who presents chief complaint of chest pain.  Patient states that 2 hours prior to arrival she had acute onset of substernal pressure-like chest pain, radiating circumferentially and through to her back and up to neck and shoulders.  His pain is 8/10 severity, better with aspirin, nitro and IV labetalol by EMS.  Patient endorsed initial nausea that has not remitted, initial diaphoresis that is now admitted, endorses mild shortness of breath.  Patient is for the last 2 to 3 weeks she has had progressive orthopnea PND and dyspnea on exertion.  Denies bilateral lower extremity swelling.  Endorses history of provoked DVT but no longer takes blood thinners.  Patient denies recent leg swelling, travel, or surgeries.  Is not been actively treated for cancer.  Denies infectious symptoms.  Past Medical History:  Diagnosis Date  . Adenomatous colon polyp    2012  . Anxiety   . Anxiety and depression   . Arthritis    Osteoarthritis, Dr Maureen Ralphs  . Depression   . Diverticulosis   . Fibromyalgia    Dr Tobie Lords, Rheu  . GERD (gastroesophageal reflux disease)   . H/O hiatal hernia   . Headache(784.0)    PAST HX MILD MIGRAINES  . Heart murmur    mitral valve prolapse--no symptoms  . History of colonic polyps 08/08/2011       . HTN (hypertension)   . IBS (irritable bowel syndrome)   . Internal hemorrhoid   . Lumbar radiculopathy   . Obesity   . Syncope    idiopathic, ? related to migraines or hyperventilation. Dr Leonie Man, Neurology,  ONE TIME EPISODE AND THIS WAS YEARS AGO    Patient Active Problem List   Diagnosis Date Noted  . New onset  a-fib ([place]) 06/25/2018  . Chest pain 06/25/2018  . Anxiety 06/25/2018  . Difficulty walking 02/13/2017  . Low back pain 02/13/2017  . Allergic rhinitis 07/31/2016  . Malignant melanoma ([place]) 08/31/2013  . Other abnormal glucose 01/22/2013  . Anemia, unspecified 01/22/2013  . OA (osteoarthritis) of hip 03/18/2012  . Personal history of DVT (deep vein thrombosis) 01/05/2012  . History of colonic polyps 08/08/2011  . ARTHRITIS, HIP 01/24/2010  . PERSONAL HISTORY OF FAILED MODERATE SEDATION 07/21/2009  . Obesity 04/05/2009  . Esophageal reflux 02/25/2009  . HEART VALVE DISEASE 12/28/2008  . HEADACHE 04/21/2008  . Essential hypertension 10/15/2007  . LUMBAR RADICULOPATHY, RIGHT 10/15/2007  . FATIGUE 10/15/2007  . IRRITABLE BOWEL SYNDROME 07/23/2007  . FIBROMYALGIA 07/23/2007    Past Surgical History:  Procedure Laterality Date  . ABDOMINAL HYSTERECTOMY     dysfunctional menses  . APPENDECTOMY    . BREAST EXCISIONAL BIOPSY Right   . CHOLECYSTECTOMY    . COLONOSCOPY  05/27/2002, 08/08/11   2003 diverticulosis, hemorrhoids 2012 same + cecal polyp  . KNEE ARTHROSCOPY  2008   right  . MELANOMA EXCISION Left 07/2013   foot  . TOTAL HIP ARTHROPLASTY  02/2010   left ; Dr Maureen Ralphs  . TOTAL HIP ARTHROPLASTY  03/18/2012   Procedure: TOTAL HIP ARTHROPLASTY;  Surgeon: Gearlean Alf, MD;  Location: [facility];  Service: Orthopedics;  Laterality: Right;  . UPPER GASTROINTESTINAL ENDOSCOPY  04/21/2009, 08/08/11   2010 gastritis ;2012 small hiatal hernia. Dr Carlean Purl     OB History   None      Home Medications    Prior to Admission medications   Medication Sig Start Date End Date Taking? Authorizing Provider  albuterol (PROVENTIL HFA;VENTOLIN HFA) 108 (90 Base) MCG/ACT inhaler Inhale 2 puffs into the lungs every 6 (six) hours as needed for wheezing or shortness of breath. 02/16/17  Yes Lucretia Kern, DO  ALPRAZolam [name]) 1 MG tablet Take 1 tablet (1 mg total) by mouth 2 (two) times daily.  06/19/18  Yes Dorena Cookey, MD  aspirin 325 MG tablet Take 325 mg by mouth daily.   Yes [provider]  CALCIUM PO Take 1,000 mg by mouth daily.    Yes [provider]  clobetasol cream (TEMOVATE) 2.02 % Apply 1 application topically as needed (Psoriasis).  09/25/13  Yes [provider]  cyclobenzaprine (FLEXERIL) 10 MG tablet Take 1 tablet (10 mg total) by mouth 4 (four) times daily as needed. SPASMS Patient taking differently: Take 10 mg by mouth 4 (four) times daily as needed for muscle spasms.  04/09/15  Yes Marletta Lor, MD  famotidine-calcium carbonate-magnesium hydroxide (PEPCID COMPLETE) 10-800-165 MG CHEW chewable tablet 1 -2 tablets as needed to prevent or treat heartburn Patient taking differently: Chew 1-2 tablets by mouth daily as needed (heartburn).  08/20/13  Yes Gatha Mayer, MD  FLUoxetine (PROZAC) 40 MG capsule TAKE 1 CAPSULE BY MOUTH EVERY DAY Patient taking differently: Take 40 mg by mouth daily.  05/21/18  Yes Marletta Lor, MD  fluticasone [facility]) 50 MCG/ACT nasal spray Place 2 sprays into both nostrils daily. Patient taking differently: Place 2 sprays into both nostrils as needed.  07/31/16  Yes Marletta Lor, MD  ibuprofen (ADVIL,MOTRIN) 800 MG tablet Take 800 mg by mouth every 8 (eight) hours as needed for moderate pain.    Yes [provider]  metoprolol succinate (TOPROL-XL) 100 MG 24 hr tablet TAKE 1 TABLET DAILY WITH OR IMMEDIATELY FOLLOWING A MEAL Patient taking differently: Take 100 mg by mouth daily.  04/02/18  Yes Marletta Lor, MD  Multiple Vitamin (MULTIVITAMIN) tablet Take 1 tablet by mouth daily.   Yes [provider]  olmesartan (BENICAR) 40 MG tablet TAKE 1 TABLET BY MOUTH EVERY DAY Patient taking differently: Take 40 mg by mouth daily.  05/13/18  Yes Marletta Lor, MD  omeprazole (PRILOSEC) 40 MG capsule TAKE 1 CAPSULE BY MOUTH EVERY DAY Patient taking differently: Take 40 mg by  mouth daily.  02/08/18  Yes Marletta Lor, MD  dicyclomine (BENTYL) 10 MG capsule Take 1 tab by mouth 2-3 times daily as needed for abdominal discomfort. Patient taking differently: Take 10 mg by mouth See admin instructions. Take 1 tablet (10MG ) by mouth 2-3 times daily as needed for abdominal discomfort. 06/20/18   Esterwood, Amy S, PA-C    Family History Family History  Problem Relation Age of Onset  . Heart attack Mother 87  . COPD Mother   . Breast cancer Mother 74  . Hypertension Father   . Diabetes Father   . Heart failure Father 74  . Breast cancer Paternal Aunt        cns mets  . COPD Maternal Grandmother   . Heart disease Paternal Grandmother        died 23  . Diabetes Paternal Grandmother   .  Stroke Paternal Grandfather        in late 43s  . Heart attack Brother 34  . Colon cancer Neg Hx     Social History Social History   Tobacco Use  . Smoking status: Never Smoker  . Smokeless tobacco: Never Used  Substance Use Topics  . Alcohol use: Yes    Comment: occasional  . Drug use: No     Allergies   Penicillins; Sulfamethoxazole-trimethoprim; Achromycin [tetracycline hcl]; Nickel; and Zanaflex [tizanidine hydrochloride]   Review of Systems Review of Systems  Constitutional: Negative for chills and fever.  HENT: Negative for ear pain and sore throat.   Eyes: Negative for pain and visual disturbance.  Respiratory: Positive for shortness of breath. Negative for cough.   Cardiovascular: Positive for chest pain and palpitations. Negative for leg swelling.  Gastrointestinal: Positive for nausea. Negative for abdominal pain and vomiting.  Genitourinary: Negative for dysuria and hematuria.  Musculoskeletal: Negative for arthralgias and back pain.  Skin: Negative for color change and rash.  Neurological: Negative for seizures and syncope.  All other systems reviewed and are negative.    Physical Exam Updated Vital Signs BP (!) 191/81 (BP Location: Right  Arm)   Pulse 65   Temp 98 F (36.7 C) (Oral)   Resp 15   Ht 5\' 7"  (1.702 m)   Wt 131.6 kg   SpO2 94%   BMI 45.44 kg/m   Physical Exam  Constitutional: She is oriented to person, place, and time. She appears well-developed and well-nourished.  Non-toxic appearance. She does not appear ill. No distress.  HENT:  Head: Normocephalic and atraumatic.  Eyes: Conjunctivae are normal.  Neck: Neck supple.  Cardiovascular: An irregularly irregular rhythm present. Tachycardia present.  No murmur heard. Pulses:      Radial pulses are 2+ on the right side, and 2+ on the left side.       Dorsalis pedis pulses are 1+ on the right side, and 2+ on the left side.  Pulmonary/Chest: Effort normal. No tachypnea. No respiratory distress. She has rhonchi in the right lower field and the left lower field.  Abdominal: Soft. There is no tenderness.  Musculoskeletal: She exhibits no edema.       Right lower leg: She exhibits no edema.       Left lower leg: She exhibits no edema.  Neurological: She is alert and oriented to person, place, and time.  Skin: Skin is warm and dry. Capillary refill takes less than 2 seconds.  Psychiatric: She has a normal mood and affect.  Nursing note and vitals reviewed.    ED Treatments / Results  Labs (all labs ordered are listed, but only abnormal results are displayed) Labs Reviewed  CBC - Abnormal; Notable for the following components:      Result Value   HCT 46.4 (*)    All other components within normal limits  COMPREHENSIVE METABOLIC PANEL - Abnormal; Notable for the following components:   Chloride 96 (*)    Glucose, Bld 117 (*)    Creatinine, Ser 1.03 (*)    AST 42 (*)    GFR calc non Af Amer 55 (*)    All other components within normal limits  BRAIN NATRIURETIC PEPTIDE - Abnormal; Notable for the following components:   B Natriuretic Peptide 620.5 (*)    All other components within normal limits  RAPID URINE DRUG SCREEN, HOSP PERFORMED - Abnormal;  Notable for the following components:   Benzodiazepines POSITIVE (*)  All other components within normal limits  URINALYSIS, ROUTINE W REFLEX MICROSCOPIC - Abnormal; Notable for the following components:   Color, Urine COLORLESS (*)    Specific Gravity, Urine 1.004 (*)    All other components within normal limits  TROPONIN I - Abnormal; Notable for the following components:   Troponin I 1.97 (*)    All other components within normal limits  CBG MONITORING, ED - Abnormal; Notable for the following components:   Glucose-Capillary 113 (*)    All other components within normal limits  I-STAT TROPONIN, ED - Abnormal; Notable for the following components:   Troponin i, poc 0.89 (*)    All other components within normal limits  MAGNESIUM  PROTIME-INR  TSH  HIV ANTIBODY (ROUTINE TESTING W REFLEX)  TROPONIN I  TROPONIN I  HEMOGLOBIN A1C  HEPARIN LEVEL (UNFRACTIONATED)  CBC  I-STAT TROPONIN, ED    EKG EKG Interpretation  Date/Time:  Tuesday June 25 2018 16:09:22 EDT Ventricular Rate:  68 PR Interval:    QRS Duration: 100 QT Interval:  408 QTC Calculation: 434 R Axis:   38 Text Interpretation:  Sinus rhythm Left atrial enlargement Confirmed by Quintella Reichert [phone]) on 06/25/2018 4:22:54 PM   Radiology Dg Chest Portable 1 View  Result Date: 06/25/2018 CLINICAL DATA:  Acute chest pain and shortness of breath today. EXAM: PORTABLE CHEST 1 VIEW COMPARISON:  01/05/2018 chest radiograph and prior studies dating back to 09/02/2004 FINDINGS: Cardiomegaly again noted. There is no evidence of focal airspace disease, pulmonary edema, suspicious pulmonary nodule/mass, pleural effusion, or pneumothorax. No acute bony abnormalities are identified. IMPRESSION: Cardiomegaly without evidence of acute cardiopulmonary disease. Electronically Signed   By: Margarette Canada M.D.   On: 06/25/2018 15:46   Ct Angio Chest/abd/pel For Dissection W And/or W/wo  Result Date: 06/25/2018 CLINICAL DATA:   67 year old female with acute chest and abdominal pain. New onset atrial fibrillation with tachycardia. EXAM: CT ANGIOGRAPHY CHEST, ABDOMEN AND PELVIS TECHNIQUE: Multidetector CT imaging through the chest, abdomen and pelvis was performed using the standard protocol during bolus administration of intravenous contrast. Multiplanar reconstructed images and MIPs were obtained and reviewed to evaluate the vascular anatomy. CONTRAST:  15mL ISOVUE-370 IOPAMIDOL (ISOVUE-370) INJECTION 76% COMPARISON:  06/25/2018 and prior radiographs, 02/12/2017 chest, abdomen and pelvis CTs and prior studies FINDINGS: CTA CHEST FINDINGS Cardiovascular: This is a technically satisfactory study. There is no evidence of thoracic aortic aneurysm, dissection or pulmonary emboli. Mild cardiomegaly noted. Mild thoracic aortic atherosclerotic calcifications noted. No pericardial effusion. Mediastinum/Nodes: No enlarged mediastinal, hilar, or axillary lymph nodes. Thyroid gland, trachea, and esophagus demonstrate no significant findings. Lungs/Pleura: Lungs are clear. No pleural effusion or pneumothorax. Musculoskeletal: No chest wall abnormality. No acute or significant osseous findings. Review of the MIP images confirms the above findings. CTA ABDOMEN AND PELVIS FINDINGS VASCULAR Aorta: Mild atherosclerotic calcification without aneurysm or dissection. Celiac: Mild atherosclerotic calcification at the origin without other significant abnormality SMA: Mild atherosclerotic calcification without other significant abnormality Renals: Atherosclerotic calcifications at the origins without other significant abnormality. IMA: Unremarkable Veins: No obvious venous abnormality within the limitations of this arterial phase study. Review of the MIP images confirms the above findings. NON-VASCULAR Artifact from bilateral hip arthroplasties limits evaluation of the pelvis. Hepatobiliary: The liver is unremarkable. The patient is status post cholecystectomy.  No biliary dilatation. Pancreas: Unremarkable Spleen: Unremarkable Adrenals/Urinary Tract: The kidneys, adrenal glands and visualized bladder are unremarkable except for a LEFT renal cyst. Stomach/Bowel: Stomach is within normal limits. No evidence of bowel  wall thickening, distention, or inflammatory changes. Colonic diverticulosis noted without evidence of diverticulitis. Lymphatic: No abnormal lymph nodes identified. Reproductive: Not well evaluated Other: No ascites, focal collection or pneumoperitoneum. Musculoskeletal: No acute or suspicious bony abnormalities. Bilateral hip arthroplasties again noted. Review of the MIP images confirms the above findings. IMPRESSION: 1. No evidence of acute abnormality. No evidence of aortic aneurysm/dissection or pulmonary emboli. 2. Mild cardiomegaly 3.  Aortic Atherosclerosis (ICD10-I70.0). Electronically Signed   By: Margarette Canada M.D.   On: 06/25/2018 18:12    Procedures Procedures (including critical care time)  Medications Ordered in ED Medications  iopamidol (ISOVUE-370) 76 % injection (has no administration in time range)  aspirin tablet 325 mg (has no administration in time range)  metoprolol succinate (TOPROL-XL) 24 hr tablet 100 mg (has no administration in time range)  irbesartan (AVAPRO) tablet 37.5 mg (has no administration in time range)  ALPRAZolam (XANAX) tablet 1 mg (1 mg Oral Given 06/25/18 2246)  FLUoxetine (PROZAC) capsule 40 mg (has no administration in time range)  pantoprazole (PROTONIX) EC tablet 40 mg (has no administration in time range)  multivitamin with minerals tablet 1 tablet (has no administration in time range)  fluticasone (FLONASE) 50 MCG/ACT nasal spray 2 spray (has no administration in time range)  acetaminophen (TYLENOL) tablet 650 mg (has no administration in time range)  ondansetron (ZOFRAN) injection 4 mg (has no administration in time range)  nitroGLYCERIN (NITROSTAT) SL tablet 0.4 mg (has no administration in time  range)  atorvastatin (LIPITOR) tablet 40 mg (40 mg Oral Given 06/25/18 2053)  hydrALAZINE (APRESOLINE) injection 5 mg (5 mg Intravenous Given 06/25/18 2053)  heparin ADULT infusion 100 units/mL (25000 units/239mL sodium chloride 0.45%) (1,300 Units/hr Intravenous New Bag/Given 06/25/18 2112)  fentaNYL (SUBLIMAZE) injection 50 mcg (50 mcg Intravenous Given 06/25/18 1641)  iopamidol (ISOVUE-370) 76 % injection 100 mL (100 mLs Intravenous Contrast Given 06/25/18 1724)  furosemide (LASIX) injection 40 mg (40 mg Intravenous Given 06/25/18 2052)  heparin bolus via infusion 4,000 Units (4,000 Units Intravenous Bolus from Bag 06/25/18 2100)     Initial Impression / Assessment and Plan / ED Course  I have reviewed the triage vital signs and the nursing notes.  Pertinent labs & imaging results that were available during my care of the patient were reviewed by me and considered in my medical decision making (see chart for details).     67yo F with PMH significant for fibromyalgia, IBS, anxiety depression, distant melanoma who presents chief complaint of chest pain.  History as above.  DDX includes new onset A. fib, ACS, heart failure as a result of A. fib or heart failure as a progenitor of A. Fib.  Patient A. fib RVR on initial evaluation, hypertensive, normal mentation.  Labs and imaging performed which revealed creatinine 1.03, BNP 620, initial troponin negative, delta troponin elevated at 0.89.  TSH 2.3.  EKG shows A. fib RVR without significant ST elevations.  Patient with acute onset of chest pain yet progressive heart failure type symptoms for last 2 or 3 weeks.  Unclear initial onset of A. fib.  Patient is not a current candidate for cardioversion.  Cardizem was ordered yet patient spontaneously converted to normal sinus rhythm before receiving Cardizem.  Vision with improvement of pain yet persistence of chest pain rating to her back and continues to be hypertensive.  Patient with possible pulse  differential and bilateral DP pulses, weaker on the right.  CTA chest dissection study ordered which reveals no acute abnormality.  Patient admitted to the hospital service for management of new onset A. fib and to trend troponins for potential NSTEMI.  Patient seen in conjunction with my attending Dr. Ayesha Rumpf who agreed with plan and disposition.  Final Clinical Impressions(s) / ED Diagnoses   Final diagnoses:  New onset a-fib [facility])    ED Discharge Orders    None      Keenan Bachelor, MD 06/26/18 0017  Quintella Reichert, MD 06/30/18 1505  Quintella Reichert, MD 07/09/18 [phone]

## 2018-06-25 NOTE — H&P (Addendum)
History and Physical    Kathy [name] PFX:[id number] DOB: 13-Jun-1951 DOA: 06/25/2018  PCP: Marletta Lor, MD Patient coming from: Home  Chief Complaint: Chest pain  HPI: Kathy [name] is a 67 y.o. female with medical history significant of hypertension, GERD, depression, anxiety presenting to the hospital for further evaluation of chest pain.  Patient states she was in in her usual state of health until she started having chest pain while eating lunch today.  The pain was 7 out of 10 in intensity, pressure-like, and radiated to her back and shoulders.  She also had associated weakness, nausea, and felt slightly shortness of breath.  States the pain lasted 2 to 3 hours until she reached the emergency room.  At present, she continues to have slight chest discomfort 1-2 out of 10 in intensity.  She denies having any palpitations.  States she was told she had a heart murmur several years ago.  She takes metoprolol and Benicar for hypertension at home and denies missing any doses.  Also reports having cough and orthopnea for the past few days.  ED Course: Hypertensive and tachycardic on arrival.  Patient found to be in new onset A. fib with rate in the 120s to 150s.  She received aspirin 324 mg, 3 nitroglycerin, and metoprolol 5 mg by EMS and converted to sinus rhythm in the ED.  Elevated BNP and chest x-ray showing cardiomegaly.  Troponin 0.05 > 0.89.  CTA chest/abdomen/pelvis negative for aortic dissection.  [facility] paged to admit.  Review of Systems: As per HPI otherwise 10 point review of systems negative.  Past Medical History:  Diagnosis Date  . Adenomatous colon polyp    2012  . Anxiety   . Anxiety and depression   . Arthritis    Osteoarthritis, Dr Maureen Ralphs  . Depression   . Diverticulosis   . Fibromyalgia    Dr Tobie Lords, Rheu  . GERD (gastroesophageal reflux disease)   . H/O hiatal hernia   . Headache(784.0)    PAST HX MILD MIGRAINES  . Heart murmur    mitral  valve prolapse--no symptoms  . History of colonic polyps 08/08/2011       . HTN (hypertension)   . IBS (irritable bowel syndrome)   . Internal hemorrhoid   . Lumbar radiculopathy   . Obesity   . Syncope    idiopathic, ? related to migraines or hyperventilation. Dr Leonie Man, Neurology,  ONE TIME EPISODE AND THIS WAS YEARS AGO    Past Surgical History:  Procedure Laterality Date  . ABDOMINAL HYSTERECTOMY     dysfunctional menses  . APPENDECTOMY    . BREAST EXCISIONAL BIOPSY Right   . CHOLECYSTECTOMY    . COLONOSCOPY  05/27/2002, 08/08/11   2003 diverticulosis, hemorrhoids 2012 same + cecal polyp  . KNEE ARTHROSCOPY  2008   right  . MELANOMA EXCISION Left 07/2013   foot  . TOTAL HIP ARTHROPLASTY  02/2010   left ; Dr Maureen Ralphs  . TOTAL HIP ARTHROPLASTY  03/18/2012   Procedure: TOTAL HIP ARTHROPLASTY;  Surgeon: Gearlean Alf, MD;  Location: [facility];  Service: Orthopedics;  Laterality: Right;  . UPPER GASTROINTESTINAL ENDOSCOPY  04/21/2009, 08/08/11   2010 gastritis ;2012 small hiatal hernia. Dr Carlean Purl     reports that she has never smoked. She has never used smokeless tobacco. She reports that she drinks alcohol. She reports that she does not use drugs.  Allergies  Allergen Reactions  . Penicillins     ? Facial  angioedema   . Sulfamethoxazole-Trimethoprim     hives  . Achromycin [Tetracycline Hcl]     Dyspnea    . Nickel     Two hip replacement  . Zanaflex [Tizanidine Hydrochloride]     Causes more pain     Family History  Problem Relation Age of Onset  . Heart attack Mother 67  . COPD Mother   . Breast cancer Mother 55  . Hypertension Father   . Diabetes Father   . Heart failure Father 34  . Breast cancer Paternal Aunt        cns mets  . COPD Maternal Grandmother   . Heart disease Paternal Grandmother        died 29  . Diabetes Paternal Grandmother   . Stroke Paternal Grandfather        in late 11s  . Heart attack Brother 24  . Colon cancer Neg Hx     Prior  to Admission medications   Medication Sig Start Date End Date Taking? Authorizing Provider  albuterol (PROVENTIL HFA;VENTOLIN HFA) 108 (90 Base) MCG/ACT inhaler Inhale 2 puffs into the lungs every 6 (six) hours as needed for wheezing or shortness of breath. 02/16/17  Yes Lucretia Kern, DO  ALPRAZolam [name]) 1 MG tablet Take 1 tablet (1 mg total) by mouth 2 (two) times daily. 06/19/18  Yes Dorena Cookey, MD  aspirin 325 MG tablet Take 325 mg by mouth daily.   Yes [provider]  CALCIUM PO Take 1,000 mg by mouth daily.    Yes [provider]  clobetasol cream (TEMOVATE) 4.40 % Apply 1 application topically as needed (Psoriasis).  09/25/13  Yes [provider]  cyclobenzaprine (FLEXERIL) 10 MG tablet Take 1 tablet (10 mg total) by mouth 4 (four) times daily as needed. SPASMS Patient taking differently: Take 10 mg by mouth 4 (four) times daily as needed for muscle spasms.  04/09/15  Yes Marletta Lor, MD  famotidine-calcium carbonate-magnesium hydroxide (PEPCID COMPLETE) 10-800-165 MG CHEW chewable tablet 1 -2 tablets as needed to prevent or treat heartburn Patient taking differently: Chew 1-2 tablets by mouth daily as needed (heartburn).  08/20/13  Yes Gatha Mayer, MD  FLUoxetine (PROZAC) 40 MG capsule TAKE 1 CAPSULE BY MOUTH EVERY DAY Patient taking differently: Take 40 mg by mouth daily.  05/21/18  Yes Marletta Lor, MD  fluticasone [facility]) 50 MCG/ACT nasal spray Place 2 sprays into both nostrils daily. Patient taking differently: Place 2 sprays into both nostrils as needed.  07/31/16  Yes Marletta Lor, MD  ibuprofen (ADVIL,MOTRIN) 800 MG tablet Take 800 mg by mouth every 8 (eight) hours as needed for moderate pain.    Yes [provider]  metoprolol succinate (TOPROL-XL) 100 MG 24 hr tablet TAKE 1 TABLET DAILY WITH OR IMMEDIATELY FOLLOWING A MEAL Patient taking differently: Take 100 mg by mouth daily.  04/02/18  Yes Marletta Lor, MD    Multiple Vitamin (MULTIVITAMIN) tablet Take 1 tablet by mouth daily.   Yes [provider]  olmesartan (BENICAR) 40 MG tablet TAKE 1 TABLET BY MOUTH EVERY DAY Patient taking differently: Take 40 mg by mouth daily.  05/13/18  Yes Marletta Lor, MD  omeprazole (PRILOSEC) 40 MG capsule TAKE 1 CAPSULE BY MOUTH EVERY DAY Patient taking differently: Take 40 mg by mouth daily.  02/08/18  Yes Marletta Lor, MD  dicyclomine (BENTYL) 10 MG capsule Take 1 tab by mouth 2-3 times daily as needed for  abdominal discomfort. Patient taking differently: Take 10 mg by mouth See admin instructions. Take 1 tablet (10MG ) by mouth 2-3 times daily as needed for abdominal discomfort. 06/20/18   Alfredia Ferguson, PA-C    Physical Exam: Vitals:   06/26/18 0124 06/26/18 0130 06/26/18 0145 06/26/18 0146  BP: (!) 176/97 (!) 141/103 (!) 188/91 (!) 174/97  Pulse: (!) 121 (!) 129 72 69  Resp: (!) 23 17 16 14   Temp:      TempSrc:      SpO2: 97% 98% 96% 98%  Weight:      Height:       Physical Exam  Constitutional: She is oriented to person, place, and time. She appears well-developed and well-nourished. No distress.  HENT:  Mouth/Throat: Oropharynx is clear and moist.  Eyes: Right eye exhibits no discharge. Left eye exhibits no discharge.  Neck: Neck supple. No tracheal deviation present.  Cardiovascular: Normal rate, regular rhythm and intact distal pulses.  Pulmonary/Chest: Effort normal and breath sounds normal. No respiratory distress. She has no wheezes.  Rales appreciated at the right lung base  Abdominal: Soft. Bowel sounds are normal. She exhibits no distension. There is no tenderness.  Musculoskeletal: She exhibits no edema.  Neurological: She is alert and oriented to person, place, and time.  Skin: Skin is warm and dry.  Psychiatric: She has a normal mood and affect. Her behavior is normal.     Labs on Admission: I have personally reviewed following labs and imaging  studies  CBC: Recent Labs  Lab 06/25/18 1527  WBC 7.1  HGB 14.1  HCT 46.4*  MCV 97.9  PLT 330   Basic Metabolic Panel: Recent Labs  Lab 06/25/18 1527  NA 140  K 3.7  CL 96*  CO2 30  GLUCOSE 117*  BUN 16  CREATININE 1.03*  CALCIUM 9.4  MG 2.0   GFR: Estimated Creatinine Clearance: 76 mL/min (A) (by C-G formula based on SCr of 1.03 mg/dL (H)). Liver Function Tests: Recent Labs  Lab 06/25/18 1527  AST 42*  ALT 43  ALKPHOS 85  BILITOT 0.9  PROT 7.0  ALBUMIN 3.8   No results for input(s): LIPASE, AMYLASE in the last 168 hours. No results for input(s): AMMONIA in the last 168 hours. Coagulation Profile: Recent Labs  Lab 06/25/18 1527  INR 1.02   Cardiac Enzymes: Recent Labs  Lab 06/25/18 2052 06/26/18 0245  TROPONINI 1.97* 4.71*   BNP (last 3 results) No results for input(s): PROBNP in the last 8760 hours. HbA1C: Recent Labs    06/26/18 0245  HGBA1C 5.4   CBG: Recent Labs  Lab 06/25/18 1559  GLUCAP 113*   Lipid Profile: No results for input(s): CHOL, HDL, LDLCALC, TRIG, CHOLHDL, LDLDIRECT in the last 72 hours. Thyroid Function Tests: Recent Labs    06/25/18 1527  TSH 2.310   Anemia Panel: No results for input(s): VITAMINB12, FOLATE, FERRITIN, TIBC, IRON, RETICCTPCT in the last 72 hours. Urine analysis:    Component Value Date/Time   COLORURINE COLORLESS (A) 06/25/2018 1421   APPEARANCEUR CLEAR 06/25/2018 1421   LABSPEC 1.004 (L) 06/25/2018 1421   PHURINE 8.0 06/25/2018 1421   GLUCOSEU NEGATIVE 06/25/2018 1421   HGBUR NEGATIVE 06/25/2018 1421   BILIRUBINUR NEGATIVE 06/25/2018 1421   BILIRUBINUR 1+ 04/06/2016 0856   KETONESUR NEGATIVE 06/25/2018 1421   PROTEINUR NEGATIVE 06/25/2018 1421   UROBILINOGEN 1.0 04/06/2016 0856   UROBILINOGEN 1.0 03/28/2012 1649   NITRITE NEGATIVE 06/25/2018 1421   LEUKOCYTESUR NEGATIVE 06/25/2018 1421  Radiological Exams on Admission: Dg Chest Portable 1 View  Result Date: 06/25/2018 CLINICAL  DATA:  Acute chest pain and shortness of breath today. EXAM: PORTABLE CHEST 1 VIEW COMPARISON:  01/05/2018 chest radiograph and prior studies dating back to 09/02/2004 FINDINGS: Cardiomegaly again noted. There is no evidence of focal airspace disease, pulmonary edema, suspicious pulmonary nodule/mass, pleural effusion, or pneumothorax. No acute bony abnormalities are identified. IMPRESSION: Cardiomegaly without evidence of acute cardiopulmonary disease. Electronically Signed   By: Margarette Canada M.D.   On: 06/25/2018 15:46   Ct Angio Chest/abd/pel For Dissection W And/or W/wo  Result Date: 06/25/2018 CLINICAL DATA:  67 year old female with acute chest and abdominal pain. New onset atrial fibrillation with tachycardia. EXAM: CT ANGIOGRAPHY CHEST, ABDOMEN AND PELVIS TECHNIQUE: Multidetector CT imaging through the chest, abdomen and pelvis was performed using the standard protocol during bolus administration of intravenous contrast. Multiplanar reconstructed images and MIPs were obtained and reviewed to evaluate the vascular anatomy. CONTRAST:  17mL ISOVUE-370 IOPAMIDOL (ISOVUE-370) INJECTION 76% COMPARISON:  06/25/2018 and prior radiographs, 02/12/2017 chest, abdomen and pelvis CTs and prior studies FINDINGS: CTA CHEST FINDINGS Cardiovascular: This is a technically satisfactory study. There is no evidence of thoracic aortic aneurysm, dissection or pulmonary emboli. Mild cardiomegaly noted. Mild thoracic aortic atherosclerotic calcifications noted. No pericardial effusion. Mediastinum/Nodes: No enlarged mediastinal, hilar, or axillary lymph nodes. Thyroid gland, trachea, and esophagus demonstrate no significant findings. Lungs/Pleura: Lungs are clear. No pleural effusion or pneumothorax. Musculoskeletal: No chest wall abnormality. No acute or significant osseous findings. Review of the MIP images confirms the above findings. CTA ABDOMEN AND PELVIS FINDINGS VASCULAR Aorta: Mild atherosclerotic calcification without  aneurysm or dissection. Celiac: Mild atherosclerotic calcification at the origin without other significant abnormality SMA: Mild atherosclerotic calcification without other significant abnormality Renals: Atherosclerotic calcifications at the origins without other significant abnormality. IMA: Unremarkable Veins: No obvious venous abnormality within the limitations of this arterial phase study. Review of the MIP images confirms the above findings. NON-VASCULAR Artifact from bilateral hip arthroplasties limits evaluation of the pelvis. Hepatobiliary: The liver is unremarkable. The patient is status post cholecystectomy. No biliary dilatation. Pancreas: Unremarkable Spleen: Unremarkable Adrenals/Urinary Tract: The kidneys, adrenal glands and visualized bladder are unremarkable except for a LEFT renal cyst. Stomach/Bowel: Stomach is within normal limits. No evidence of bowel wall thickening, distention, or inflammatory changes. Colonic diverticulosis noted without evidence of diverticulitis. Lymphatic: No abnormal lymph nodes identified. Reproductive: Not well evaluated Other: No ascites, focal collection or pneumoperitoneum. Musculoskeletal: No acute or suspicious bony abnormalities. Bilateral hip arthroplasties again noted. Review of the MIP images confirms the above findings. IMPRESSION: 1. No evidence of acute abnormality. No evidence of aortic aneurysm/dissection or pulmonary emboli. 2. Mild cardiomegaly 3.  Aortic Atherosclerosis (ICD10-I70.0). Electronically Signed   By: Margarette Canada M.D.   On: 06/25/2018 18:12    EKG: Independently reviewed.  Sinus rhythm (heart rate 68).  Assessment/Plan Principal Problem:   NSTEMI (non-ST elevated myocardial infarction) [facility]) Active Problems:   Essential hypertension   New onset a-fib (HCC)   Anxiety    NSTEMI Hypertensive and tachycardic on arrival to the ED. Received aspirin and nitroglycerin via EMS.  Troponin 0.05 > 0.89.  EKG done in the ED not suggestive  of ACS.  Patient continues to complain of slight chest discomfort.  Risk factors for coronary artery disease include age, obesity, family history, and hypertension.  Lipid panel done in August 2019 showing LDL 146.  She is not on a statin. -I discussed the case with  on-call cardiology fellow Dr. [place] Lions and he thinks the troponin elevation is likely related to demand ischemia.  He recommended starting the patient on a heparin drip, controlling high blood pressure, and continuing to trend trops.  Cardiology will see the patient in the morning. -Continue to trend troponin -IV heparin  -Continue home metoprolol and Benicar for hypertension. IV hydralazine as needed. -Sublingual nitroglycerin as needed -Continue Aspirin  -Supplemental oxygen -Start high intensity statin.  ASCVD ten-year risk score 17.6%.  -A1c, echo, cycle trops -Admit to stepdown  New onset A. fib likely secondary to acute exacerbation of congestive heart failure -Patient found to be in new onset A. fib with rate in the 120s to 150s.  She received metoprolol 5 mg by EMS and converted to sinus rhythm in the ED. TSH normal.  -Elevated BNP (620) and chest x-ray showing cardiomegaly.  No prior echo in chart.  Patient endorsing cough and orthopnea for the last few days. -IV Lasix 40 mg today -Echo -Intake and output -Daily weights  Addendum: Patient seen by cardiology.  Low-dose beta-blocker started for new onset A. Fib.  Hypertension Blood pressure 183/139 on arrival.  -IV hydralazine as needed -Continue home metoprolol -Continue home Benicar  GERD -Continue PPI  Anxiety -Continue home Xanax  DVT prophylaxis: Heparin Code Status: Patient wishes to be full code Family Communication: Husband at bedside updated Disposition Plan: Anticipate discharge in 1 to 2 days to home Consults called: Cardiology (Dr. Love Lions) Admission status: Inpatient.  Patient will need IV diuresis.    Shela Leff MD [facility] Pager [phone]  If 7PM-7AM, please contact night-coverage [email or web address] Password [email or web address]  06/26/2018, 4:20 AM

## 2018-06-26 ENCOUNTER — Inpatient Hospital Stay (HOSPITAL_COMMUNITY): Payer: Medicare Other

## 2018-06-26 ENCOUNTER — Encounter (HOSPITAL_COMMUNITY): Payer: Self-pay | Admitting: Cardiovascular Disease

## 2018-06-26 ENCOUNTER — Other Ambulatory Visit: Payer: Self-pay

## 2018-06-26 ENCOUNTER — Encounter (HOSPITAL_COMMUNITY)
Admission: EM | Disposition: A | Discharge status: Home / Self Care | Payer: Self-pay | Source: Home / Self Care | Attending: Student

## 2018-06-26 DIAGNOSIS — I214 Non-ST elevation (NSTEMI) myocardial infarction: Secondary | ICD-10-CM

## 2018-06-26 DIAGNOSIS — I2510 Atherosclerotic heart disease of native coronary artery without angina pectoris: Secondary | ICD-10-CM

## 2018-06-26 DIAGNOSIS — F419 Anxiety disorder, unspecified: Secondary | ICD-10-CM

## 2018-06-26 DIAGNOSIS — I160 Hypertensive urgency: Secondary | ICD-10-CM

## 2018-06-26 DIAGNOSIS — I4891 Unspecified atrial fibrillation: Secondary | ICD-10-CM

## 2018-06-26 DIAGNOSIS — I361 Nonrheumatic tricuspid (valve) insufficiency: Secondary | ICD-10-CM

## 2018-06-26 HISTORY — PX: LEFT HEART CATH AND CORONARY ANGIOGRAPHY: CATH118249

## 2018-06-26 HISTORY — PX: CARDIAC CATHETERIZATION: SHX172

## 2018-06-26 LAB — BASIC METABOLIC PANEL
Anion gap: 12 (ref 5–15)
BUN: 13 mg/dL (ref 8–23)
CO2: 31 mmol/L (ref 22–32)
Calcium: 9.2 mg/dL (ref 8.9–10.3)
Chloride: 94 mmol/L — ABNORMAL LOW (ref 98–111)
Creatinine, Ser: 1.06 mg/dL — ABNORMAL HIGH (ref 0.44–1.00)
GFR calc Af Amer: >60 mL/min (ref >60)
GFR calc non Af Amer: 53 mL/min — ABNORMAL LOW (ref >60)
Glucose, Bld: 157 mg/dL — ABNORMAL HIGH (ref 70–99)
Potassium: 3.2 mmol/L — ABNORMAL LOW (ref 3.5–5.1)
Sodium: 137 mmol/L (ref 135–145)

## 2018-06-26 LAB — HIV ANTIBODY (ROUTINE TESTING W REFLEX): HIV Screen 4th Generation wRfx: NONREACTIVE

## 2018-06-26 LAB — HEPARIN LEVEL (UNFRACTIONATED)
Heparin Unfractionated: 0.26 IU/mL — ABNORMAL LOW (ref 0.30–0.70)
Heparin Unfractionated: 0.32 IU/mL (ref 0.30–0.70)

## 2018-06-26 LAB — CBC
HCT: 48.8 % — ABNORMAL HIGH (ref 36.0–46.0)
Hemoglobin: 15.5 g/dL — ABNORMAL HIGH (ref 12.0–15.0)
MCH: 30.8 pg (ref 26.0–34.0)
MCHC: 31.8 g/dL (ref 30.0–36.0)
MCV: 96.8 fL (ref 80.0–100.0)
Platelets: 234 10*3/uL (ref 150–400)
RBC: 5.04 MIL/uL (ref 3.87–5.11)
RDW: 14.4 % (ref 11.5–15.5)
WBC: 6.7 10*3/uL (ref 4.0–10.5)
nRBC: 0 % (ref 0.0–0.2)

## 2018-06-26 LAB — TROPONIN I
Troponin I: 4.71 ng/mL (ref <0.03)
Troponin I: 4.96 ng/mL (ref <0.03)

## 2018-06-26 LAB — MAGNESIUM: Magnesium: 1.9 mg/dL (ref 1.7–2.4)

## 2018-06-26 LAB — TSH: TSH: 1.782 u[IU]/mL (ref 0.350–4.500)

## 2018-06-26 LAB — HEMOGLOBIN A1C
Hgb A1c MFr Bld: 5.4 % (ref 4.8–5.6)
Mean Plasma Glucose: 108.28 mg/dL

## 2018-06-26 LAB — ECHOCARDIOGRAM COMPLETE
Height: 67 in
Weight: 4504.44 oz

## 2018-06-26 SURGERY — LEFT HEART CATH AND CORONARY ANGIOGRAPHY
Anesthesia: LOCAL

## 2018-06-26 MED ORDER — NITROGLYCERIN IN D5W 200-5 MCG/ML-% IV SOLN
INTRAVENOUS | Status: AC
  Filled 2018-06-26: qty 250

## 2018-06-26 MED ORDER — FENTANYL CITRATE (PF) 100 MCG/2ML IJ SOLN
INTRAMUSCULAR | Status: AC
  Filled 2018-06-26: qty 2

## 2018-06-26 MED ORDER — VERAPAMIL HCL 2.5 MG/ML IV SOLN
INTRAVENOUS | Status: DC | PRN
  Administered 2018-06-26: 10 mL via INTRA_ARTERIAL

## 2018-06-26 MED ORDER — LIDOCAINE HCL (PF) 1 % IJ SOLN
INTRAMUSCULAR | Status: DC | PRN
  Administered 2018-06-26: 3 mL

## 2018-06-26 MED ORDER — LIDOCAINE HCL (PF) 1 % IJ SOLN
INTRAMUSCULAR | Status: AC
  Filled 2018-06-26: qty 30

## 2018-06-26 MED ORDER — HEPARIN SODIUM (PORCINE) 1000 UNIT/ML IJ SOLN
INTRAMUSCULAR | Status: DC | PRN
  Administered 2018-06-26: 6000 [IU] via INTRAVENOUS

## 2018-06-26 MED ORDER — METOPROLOL TARTRATE 5 MG/5ML IV SOLN
INTRAVENOUS | Status: AC
  Filled 2018-06-26: qty 5

## 2018-06-26 MED ORDER — SODIUM CHLORIDE 0.9% FLUSH: 3.0000 mL | INTRAVENOUS | Status: DC | PRN

## 2018-06-26 MED ORDER — METOPROLOL TARTRATE 5 MG/5ML IV SOLN
5.0000 mg | Freq: Once | INTRAVENOUS | Status: AC
  Administered 2018-06-26: 5 mg via INTRAVENOUS

## 2018-06-26 MED ORDER — HEPARIN (PORCINE) IN NACL 1000-0.9 UT/500ML-% IV SOLN
INTRAVENOUS | Status: DC | PRN
  Administered 2018-06-26: 500 mL

## 2018-06-26 MED ORDER — SODIUM CHLORIDE 0.9 % WEIGHT BASED INFUSION: 1.0000 mL/kg/h | INTRAVENOUS | Status: DC

## 2018-06-26 MED ORDER — NITROGLYCERIN IN D5W 200-5 MCG/ML-% IV SOLN
0.0000 ug/min | INTRAVENOUS | Status: DC
  Administered 2018-06-26: 10 ug/min via INTRAVENOUS
  Administered 2018-06-26 (×2): 15 ug/min via INTRAVENOUS
  Administered 2018-06-26: 20 ug/min via INTRAVENOUS

## 2018-06-26 MED ORDER — SODIUM CHLORIDE 0.9 % IV SOLN: 250.0000 mL | INTRAVENOUS | Status: DC | PRN

## 2018-06-26 MED ORDER — SODIUM CHLORIDE 0.9% FLUSH: 3.0000 mL | Freq: Two times a day (BID) | INTRAVENOUS | Status: DC

## 2018-06-26 MED ORDER — POTASSIUM CHLORIDE 20 MEQ PO PACK
40.0000 meq | PACK | ORAL | Status: AC
  Administered 2018-06-26 (×2): 40 meq via ORAL
  Filled 2018-06-26 (×2): qty 2

## 2018-06-26 MED ORDER — SODIUM CHLORIDE 0.9 % IV SOLN: INTRAVENOUS | Status: AC

## 2018-06-26 MED ORDER — SODIUM CHLORIDE 0.9% FLUSH
3.0000 mL | Freq: Two times a day (BID) | INTRAVENOUS | Status: DC
  Administered 2018-06-27: 3 mL via INTRAVENOUS

## 2018-06-26 MED ORDER — IOHEXOL 350 MG/ML SOLN
INTRAVENOUS | Status: DC | PRN
  Administered 2018-06-26: 75 mL via INTRAVENOUS

## 2018-06-26 MED ORDER — HEPARIN (PORCINE) IN NACL 1000-0.9 UT/500ML-% IV SOLN
INTRAVENOUS | Status: AC
  Filled 2018-06-26: qty 1000

## 2018-06-26 MED ORDER — MIDAZOLAM HCL 2 MG/2ML IJ SOLN
INTRAMUSCULAR | Status: DC | PRN
  Administered 2018-06-26 (×2): 1 mg via INTRAVENOUS

## 2018-06-26 MED ORDER — MIDAZOLAM HCL 2 MG/2ML IJ SOLN
INTRAMUSCULAR | Status: AC
  Filled 2018-06-26: qty 2

## 2018-06-26 MED ORDER — SODIUM CHLORIDE 0.9 % WEIGHT BASED INFUSION
3.0000 mL/kg/h | INTRAVENOUS | Status: DC
  Administered 2018-06-26: 3 mL/kg/h via INTRAVENOUS

## 2018-06-26 MED ORDER — ASPIRIN 81 MG PO CHEW: 81.0000 mg | CHEWABLE_TABLET | ORAL | Status: AC

## 2018-06-26 MED ORDER — FENTANYL CITRATE (PF) 100 MCG/2ML IJ SOLN
INTRAMUSCULAR | Status: DC | PRN
  Administered 2018-06-26: 25 ug via INTRAVENOUS

## 2018-06-26 SURGICAL SUPPLY — 11 items
CATH INFINITI 5FR ANG PIGTAIL (CATHETERS) ×1 IMPLANT
CATH INFINITI 5FR JK (CATHETERS) ×1 IMPLANT
DEVICE RAD COMP TR BAND LRG (VASCULAR PRODUCTS) ×1 IMPLANT
GLIDESHEATH SLEND SS 6F .021 (SHEATH) ×1 IMPLANT
GUIDEWIRE INQWIRE 1.5J.035X260 (WIRE) IMPLANT
INQWIRE 1.5J .035X260CM (WIRE) ×2
KIT HEART LEFT (KITS) ×2 IMPLANT
PACK CARDIAC CATHETERIZATION (CUSTOM PROCEDURE TRAY) ×2 IMPLANT
SYR MEDRAD MARK V 150ML (SYRINGE) ×2 IMPLANT
TRANSDUCER W/STOPCOCK (MISCELLANEOUS) ×2 IMPLANT
TUBING CIL FLEX 10 FLL-RA (TUBING) ×2 IMPLANT

## 2018-06-26 NOTE — Progress Notes (Signed)
TR band removed. Gauze & tegaderm applied. Pt educated for dressing to remain on for 24hours.  Will continue to monitor.  Arnell Sieving, RN, BSN

## 2018-06-26 NOTE — Progress Notes (Signed)
Pt transferred to 4e14 from ED. CHG bath given, telebox 14 applied, CCMD notified. Pt is resting in bed. Will continue to monitor.   Fransico Michael, RN

## 2018-06-26 NOTE — Progress Notes (Signed)
Progress Note  Patient Name: Kathy [name] Date of Encounter: 06/26/2018  Primary Cardiologist: No primary care provider on file. New  Subjective   Feels much better this am. No current chest pain or SOB  Inpatient Medications    Scheduled Meds: . ALPRAZolam  1 mg Oral BID  . aspirin  325 mg Oral Daily  . atorvastatin  40 mg Oral q1800  . FLUoxetine  40 mg Oral Daily  . irbesartan  37.5 mg Oral Daily  . metoprolol succinate  100 mg Oral Daily  . multivitamin with minerals  1 tablet Oral Daily  . pantoprazole  40 mg Oral Daily  . sodium chloride flush  3 mL Intravenous Q12H   Continuous Infusions: . sodium chloride    . sodium chloride    . heparin 1,500 Units/hr (06/26/18 0429)  . nitroGLYCERIN 20 mcg/min (06/26/18 0533)   PRN Meds: sodium chloride, acetaminophen, fluticasone, hydrALAZINE, nitroGLYCERIN, ondansetron (ZOFRAN) IV, sodium chloride flush   Vital Signs    Vitals:   06/26/18 0508 06/26/18 0539 06/26/18 0812 06/26/18 0818  BP: (!) 174/108 (!) 140/96    Pulse:  (!) 134  69  Resp:  18 12 15   Temp:    98.3 F (36.8 C)  TempSrc:    Oral  SpO2:      Weight:      Height:        Intake/Output Summary (Last 24 hours) at 06/26/2018 1105 Last data filed at 06/26/2018 0600 Gross per 24 hour  Intake 0 ml  Output 2750 ml  Net -2750 ml   Filed Weights   06/25/18 1429 06/25/18 1957 06/26/18 0500  Weight: 126.1 kg 131.6 kg 127.7 kg    Telemetry    Paroxysmal AFib with RVR. Now NSR rate 72. - Personally Reviewed  ECG    NSR with normal Ecg. - Personally Reviewed  Physical Exam   GEN: No acute distress.  Morbidly obese Neck: No JVD Cardiac: RRR, no murmurs, rubs, or gallops.  Respiratory: Clear to auscultation bilaterally. GI: Soft, nontender, non-distended  MS: No edema; No deformity. Neuro:  Nonfocal  Psych: Normal affect   Labs    Chemistry Recent Labs  Lab 06/25/18 1527 06/26/18 0709  NA 140 137  K 3.7 3.2*  CL 96* 94*  CO2 30  31  GLUCOSE 117* 157*  BUN 16 13  CREATININE 1.03* 1.06*  CALCIUM 9.4 9.2  PROT 7.0  --   ALBUMIN 3.8  --   AST 42*  --   ALT 43  --   ALKPHOS 85  --   BILITOT 0.9  --   GFRNONAA 55* 53*  GFRAA >60 >60  ANIONGAP 14 12     Hematology Recent Labs  Lab 06/25/18 1527 06/26/18 0709  WBC 7.1 6.7  RBC 4.74 5.04  HGB 14.1 15.5*  HCT 46.4* 48.8*  MCV 97.9 96.8  MCH 29.7 30.8  MCHC 30.4 31.8  RDW 14.2 14.4  PLT 209 234    Cardiac Enzymes Recent Labs  Lab 06/25/18 2052 06/26/18 0245 06/26/18 0709  TROPONINI 1.97* 4.71* 4.96*    Recent Labs  Lab 06/25/18 1605 06/25/18 1831  TROPIPOC 0.05 0.89*     BNP Recent Labs  Lab 06/25/18 1527  BNP 620.5*     DDimer No results for input(s): DDIMER in the last 168 hours.   Radiology    Dg Chest Portable 1 View  Result Date: 06/25/2018 CLINICAL DATA:  Acute chest pain and shortness of  breath today. EXAM: PORTABLE CHEST 1 VIEW COMPARISON:  01/05/2018 chest radiograph and prior studies dating back to 09/02/2004 FINDINGS: Cardiomegaly again noted. There is no evidence of focal airspace disease, pulmonary edema, suspicious pulmonary nodule/mass, pleural effusion, or pneumothorax. No acute bony abnormalities are identified. IMPRESSION: Cardiomegaly without evidence of acute cardiopulmonary disease. Electronically Signed   By: Margarette Canada M.D.   On: 06/25/2018 15:46   Ct Angio Chest/abd/pel For Dissection W And/or W/wo  Result Date: 06/25/2018 CLINICAL DATA:  67 year old female with acute chest and abdominal pain. New onset atrial fibrillation with tachycardia. EXAM: CT ANGIOGRAPHY CHEST, ABDOMEN AND PELVIS TECHNIQUE: Multidetector CT imaging through the chest, abdomen and pelvis was performed using the standard protocol during bolus administration of intravenous contrast. Multiplanar reconstructed images and MIPs were obtained and reviewed to evaluate the vascular anatomy. CONTRAST:  171mL ISOVUE-370 IOPAMIDOL (ISOVUE-370) INJECTION  76% COMPARISON:  06/25/2018 and prior radiographs, 02/12/2017 chest, abdomen and pelvis CTs and prior studies FINDINGS: CTA CHEST FINDINGS Cardiovascular: This is a technically satisfactory study. There is no evidence of thoracic aortic aneurysm, dissection or pulmonary emboli. Mild cardiomegaly noted. Mild thoracic aortic atherosclerotic calcifications noted. No pericardial effusion. Mediastinum/Nodes: No enlarged mediastinal, hilar, or axillary lymph nodes. Thyroid gland, trachea, and esophagus demonstrate no significant findings. Lungs/Pleura: Lungs are clear. No pleural effusion or pneumothorax. Musculoskeletal: No chest wall abnormality. No acute or significant osseous findings. Review of the MIP images confirms the above findings. CTA ABDOMEN AND PELVIS FINDINGS VASCULAR Aorta: Mild atherosclerotic calcification without aneurysm or dissection. Celiac: Mild atherosclerotic calcification at the origin without other significant abnormality SMA: Mild atherosclerotic calcification without other significant abnormality Renals: Atherosclerotic calcifications at the origins without other significant abnormality. IMA: Unremarkable Veins: No obvious venous abnormality within the limitations of this arterial phase study. Review of the MIP images confirms the above findings. NON-VASCULAR Artifact from bilateral hip arthroplasties limits evaluation of the pelvis. Hepatobiliary: The liver is unremarkable. The patient is status post cholecystectomy. No biliary dilatation. Pancreas: Unremarkable Spleen: Unremarkable Adrenals/Urinary Tract: The kidneys, adrenal glands and visualized bladder are unremarkable except for a LEFT renal cyst. Stomach/Bowel: Stomach is within normal limits. No evidence of bowel wall thickening, distention, or inflammatory changes. Colonic diverticulosis noted without evidence of diverticulitis. Lymphatic: No abnormal lymph nodes identified. Reproductive: Not well evaluated Other: No ascites, focal  collection or pneumoperitoneum. Musculoskeletal: No acute or suspicious bony abnormalities. Bilateral hip arthroplasties again noted. Review of the MIP images confirms the above findings. IMPRESSION: 1. No evidence of acute abnormality. No evidence of aortic aneurysm/dissection or pulmonary emboli. 2. Mild cardiomegaly 3.  Aortic Atherosclerosis (ICD10-I70.0). Electronically Signed   By: Margarette Canada M.D.   On: 06/25/2018 18:12    Cardiac Studies   pending  Patient Profile     67 y.o. female with history of HTN and obesity presents with acute chest pain and AFib with RVR.   Assessment & Plan    1. NSTEMI. Troponin peak 4.96. Ecg without acute ST changes. This may be demand ischemia in setting of severe HTN and AFib with RVR. Need to evaluate coronary anatomy. Recommend cardiac cath today with PCI if indicated. The procedure and risks were reviewed including but not limited to death, myocardial infarction, stroke, arrythmias, bleeding, transfusion, emergency surgery, dye allergy, or renal dysfunction. The patient voices understanding and is agreeable to proceed. If PCI needed would consider using Plavix since she will need anticoagulation 2. AFib with RVR. Paroxysmal. Current management with rate control with beta blockade. May need AAD  therapy pending results of Echo and cardiac cath. Plan initiation of DOAC after cardiac cath. [place] vasc of 3.  3. Hypertensive urgency. BP now improved with beta blocker, nitrates, ARB. 4. Hypercholesterolemia last LDL 146. Will start statin therapy.       For questions or updates, please contact [place] Please consult [email or web address] for contact info under        Signed, Peter Martinique, MD  06/26/2018, 11:05 AM

## 2018-06-26 NOTE — Progress Notes (Signed)
ANTICOAGULATION CONSULT NOTE - Follow Up Consult  Pharmacy Consult for heparin Indication: NSTEMI and Afib  Labs: Recent Labs    06/25/18 1527 06/25/18 2052 06/26/18 0245  HGB 14.1  --   --   HCT 46.4*  --   --   PLT 209  --   --   LABPROT 13.3  --   --   INR 1.02  --   --   HEPARINUNFRC  --   --  0.26*  CREATININE 1.03*  --   --   TROPONINI  --  1.97*  --     Assessment: 67yo female subtherapeutic on heparin with initial dosing for NSTEMI and Afib; no gtt issues or signs of bleeding per RN.  Goal of Therapy:  Heparin level 0.3-0.7 units/ml   Plan:  Will increase heparin gtt by 1-2 units/kg/hr to 1500 units/hr and check level with next scheduled lab draw.    Wynona Neat, PharmD, BCPS  06/26/2018,3:35 AM

## 2018-06-26 NOTE — H&P (View-Only) (Signed)
Progress Note  Patient Name: Kathy [name] Date of Encounter: 06/26/2018  Primary Cardiologist: No primary care provider on file. New  Subjective   Feels much better this am. No current chest pain or SOB  Inpatient Medications    Scheduled Meds: . ALPRAZolam  1 mg Oral BID  . aspirin  325 mg Oral Daily  . atorvastatin  40 mg Oral q1800  . FLUoxetine  40 mg Oral Daily  . irbesartan  37.5 mg Oral Daily  . metoprolol succinate  100 mg Oral Daily  . multivitamin with minerals  1 tablet Oral Daily  . pantoprazole  40 mg Oral Daily  . sodium chloride flush  3 mL Intravenous Q12H   Continuous Infusions: . sodium chloride    . sodium chloride    . heparin 1,500 Units/hr (06/26/18 0429)  . nitroGLYCERIN 20 mcg/min (06/26/18 0533)   PRN Meds: sodium chloride, acetaminophen, fluticasone, hydrALAZINE, nitroGLYCERIN, ondansetron (ZOFRAN) IV, sodium chloride flush   Vital Signs    Vitals:   06/26/18 0508 06/26/18 0539 06/26/18 0812 06/26/18 0818  BP: (!) 174/108 (!) 140/96    Pulse:  (!) 134  69  Resp:  18 12 15   Temp:    98.3 F (36.8 C)  TempSrc:    Oral  SpO2:      Weight:      Height:        Intake/Output Summary (Last 24 hours) at 06/26/2018 1105 Last data filed at 06/26/2018 0600 Gross per 24 hour  Intake 0 ml  Output 2750 ml  Net -2750 ml   Filed Weights   06/25/18 1429 06/25/18 1957 06/26/18 0500  Weight: 126.1 kg 131.6 kg 127.7 kg    Telemetry    Paroxysmal AFib with RVR. Now NSR rate 72. - Personally Reviewed  ECG    NSR with normal Ecg. - Personally Reviewed  Physical Exam   GEN: No acute distress.  Morbidly obese Neck: No JVD Cardiac: RRR, no murmurs, rubs, or gallops.  Respiratory: Clear to auscultation bilaterally. GI: Soft, nontender, non-distended  MS: No edema; No deformity. Neuro:  Nonfocal  Psych: Normal affect   Labs    Chemistry Recent Labs  Lab 06/25/18 1527 06/26/18 0709  NA 140 137  K 3.7 3.2*  CL 96* 94*  CO2 30  31  GLUCOSE 117* 157*  BUN 16 13  CREATININE 1.03* 1.06*  CALCIUM 9.4 9.2  PROT 7.0  --   ALBUMIN 3.8  --   AST 42*  --   ALT 43  --   ALKPHOS 85  --   BILITOT 0.9  --   GFRNONAA 55* 53*  GFRAA >60 >60  ANIONGAP 14 12     Hematology Recent Labs  Lab 06/25/18 1527 06/26/18 0709  WBC 7.1 6.7  RBC 4.74 5.04  HGB 14.1 15.5*  HCT 46.4* 48.8*  MCV 97.9 96.8  MCH 29.7 30.8  MCHC 30.4 31.8  RDW 14.2 14.4  PLT 209 234    Cardiac Enzymes Recent Labs  Lab 06/25/18 2052 06/26/18 0245 06/26/18 0709  TROPONINI 1.97* 4.71* 4.96*    Recent Labs  Lab 06/25/18 1605 06/25/18 1831  TROPIPOC 0.05 0.89*     BNP Recent Labs  Lab 06/25/18 1527  BNP 620.5*     DDimer No results for input(s): DDIMER in the last 168 hours.   Radiology    Dg Chest Portable 1 View  Result Date: 06/25/2018 CLINICAL DATA:  Acute chest pain and shortness of  breath today. EXAM: PORTABLE CHEST 1 VIEW COMPARISON:  01/05/2018 chest radiograph and prior studies dating back to 09/02/2004 FINDINGS: Cardiomegaly again noted. There is no evidence of focal airspace disease, pulmonary edema, suspicious pulmonary nodule/mass, pleural effusion, or pneumothorax. No acute bony abnormalities are identified. IMPRESSION: Cardiomegaly without evidence of acute cardiopulmonary disease. Electronically Signed   By: Margarette Canada M.D.   On: 06/25/2018 15:46   Ct Angio Chest/abd/pel For Dissection W And/or W/wo  Result Date: 06/25/2018 CLINICAL DATA:  67 year old female with acute chest and abdominal pain. New onset atrial fibrillation with tachycardia. EXAM: CT ANGIOGRAPHY CHEST, ABDOMEN AND PELVIS TECHNIQUE: Multidetector CT imaging through the chest, abdomen and pelvis was performed using the standard protocol during bolus administration of intravenous contrast. Multiplanar reconstructed images and MIPs were obtained and reviewed to evaluate the vascular anatomy. CONTRAST:  124mL ISOVUE-370 IOPAMIDOL (ISOVUE-370) INJECTION  76% COMPARISON:  06/25/2018 and prior radiographs, 02/12/2017 chest, abdomen and pelvis CTs and prior studies FINDINGS: CTA CHEST FINDINGS Cardiovascular: This is a technically satisfactory study. There is no evidence of thoracic aortic aneurysm, dissection or pulmonary emboli. Mild cardiomegaly noted. Mild thoracic aortic atherosclerotic calcifications noted. No pericardial effusion. Mediastinum/Nodes: No enlarged mediastinal, hilar, or axillary lymph nodes. Thyroid gland, trachea, and esophagus demonstrate no significant findings. Lungs/Pleura: Lungs are clear. No pleural effusion or pneumothorax. Musculoskeletal: No chest wall abnormality. No acute or significant osseous findings. Review of the MIP images confirms the above findings. CTA ABDOMEN AND PELVIS FINDINGS VASCULAR Aorta: Mild atherosclerotic calcification without aneurysm or dissection. Celiac: Mild atherosclerotic calcification at the origin without other significant abnormality SMA: Mild atherosclerotic calcification without other significant abnormality Renals: Atherosclerotic calcifications at the origins without other significant abnormality. IMA: Unremarkable Veins: No obvious venous abnormality within the limitations of this arterial phase study. Review of the MIP images confirms the above findings. NON-VASCULAR Artifact from bilateral hip arthroplasties limits evaluation of the pelvis. Hepatobiliary: The liver is unremarkable. The patient is status post cholecystectomy. No biliary dilatation. Pancreas: Unremarkable Spleen: Unremarkable Adrenals/Urinary Tract: The kidneys, adrenal glands and visualized bladder are unremarkable except for a LEFT renal cyst. Stomach/Bowel: Stomach is within normal limits. No evidence of bowel wall thickening, distention, or inflammatory changes. Colonic diverticulosis noted without evidence of diverticulitis. Lymphatic: No abnormal lymph nodes identified. Reproductive: Not well evaluated Other: No ascites, focal  collection or pneumoperitoneum. Musculoskeletal: No acute or suspicious bony abnormalities. Bilateral hip arthroplasties again noted. Review of the MIP images confirms the above findings. IMPRESSION: 1. No evidence of acute abnormality. No evidence of aortic aneurysm/dissection or pulmonary emboli. 2. Mild cardiomegaly 3.  Aortic Atherosclerosis (ICD10-I70.0). Electronically Signed   By: Margarette Canada M.D.   On: 06/25/2018 18:12    Cardiac Studies   pending  Patient Profile     67 y.o. female with history of HTN and obesity presents with acute chest pain and AFib with RVR.   Assessment & Plan    1. NSTEMI. Troponin peak 4.96. Ecg without acute ST changes. This may be demand ischemia in setting of severe HTN and AFib with RVR. Need to evaluate coronary anatomy. Recommend cardiac cath today with PCI if indicated. The procedure and risks were reviewed including but not limited to death, myocardial infarction, stroke, arrythmias, bleeding, transfusion, emergency surgery, dye allergy, or renal dysfunction. The patient voices understanding and is agreeable to proceed. If PCI needed would consider using Plavix since she will need anticoagulation 2. AFib with RVR. Paroxysmal. Current management with rate control with beta blockade. May need AAD  therapy pending results of Echo and cardiac cath. Plan initiation of DOAC after cardiac cath. [place] vasc of 3.  3. Hypertensive urgency. BP now improved with beta blocker, nitrates, ARB. 4. Hypercholesterolemia last LDL 146. Will start statin therapy.       For questions or updates, please contact [place] Please consult [email or web address] for contact info under        Signed, Olander Friedl Martin[place], MD  06/26/2018, 11:05 AM

## 2018-06-26 NOTE — Interval H&P Note (Signed)
Cath Lab Visit (complete for each Cath Lab visit)  Clinical Evaluation Leading to the Procedure:   ACS: Yes.    Non-ACS:  n/a     History and Physical Interval Note:  06/26/2018 11:41 AM  Kathy [name]  has presented today for surgery, with the diagnosis of NSTEMI  The various methods of treatment have been discussed with the patient and family. After consideration of risks, benefits and other options for treatment, the patient has consented to  Procedure(s): LEFT HEART CATH AND CORONARY ANGIOGRAPHY (N/A) as a surgical intervention .  The patient's history has been reviewed, patient examined, no change in status, stable for surgery.  I have reviewed the patient's chart and labs.  Questions were answered to the patient's satisfaction.     [name]

## 2018-06-26 NOTE — Progress Notes (Signed)
Pt has had recurring events of A. Fib RVR throughout shift. HR ranged between 130s-140s around 0030. MD notified. Hydralazine 5 mg IV, Nitro gtt, metoprolol 5 mg IV given. Pt converted back to sinus rhythm about 0145. Systolic bp ranged between 140s-180s. Interventions temporarily decreased bp, did not sustain. Pt complained of headache. Tylenol PO given. Troponin level was elevated to 4.71. Pt converted back to A. Fib with HR in 140s and bp again in 180s. Hydralazine 5 mg given. MD was notified. Orders given to administer Metoprolol 5 mg IV push along with scheduled metoprolol 100 mg PO. HR has decreased to 110s and bp 140/96. Pt has a urine output of 2,750 ml throughout shift. Pt is resting in the bed. Call light within reach. Will continue to monitor.  Fransico Michael, RN

## 2018-06-26 NOTE — Progress Notes (Signed)
PROGRESS NOTE  Kathy [name] MVH:[id number] DOB: March 17, 1951 DOA: 06/25/2018 PCP: Marletta Lor, MD   LOS: 1 day   Brief Narrative / Interim history: 67 year old female with history of hypertension, GERD, depression and anxiety admitted with A. fib with RVR and substernal chest pain.  BNP elevated.  Chest x-ray with cardiomegaly.  EKG with nonspecific T wave but no actute changes.  Troponin trended from 0.05---> 4.7 this morning.  Started on heparin drip, beta-blocker and nitro drip. She had left heart catheterization 06/26/2018 which showed mild CAD and preserved ejection fraction.  Troponin rise was thought to be due to atrial fibrillation. Subjective: No chest pain this morning.  Denies dyspnea.  Aware of the procedure.  She has been n.p.o. since midnight.  Assessment & Plan: Principal Problem:   NSTEMI (non-ST elevated myocardial infarction) [facility]) Active Problems:   Essential hypertension   New onset a-fib (HCC)   Anxiety  NSTEMI/troponinemia: Significant troponin rise but plateauing. Left heart catheterization without significant CAD.  EF preserved.  Likely due to atrial fibrillation per cardiology. -Cardiology following.   -Continue beta-blocker and statin -Resume aspirin -PRN nitro  New onset A. Fib with RVR: RVR resolved.  Chads vas score 3. -Cardiology following  -continue beta-blocker -Start DOAC tomorrow. -Check TSH  Acute exacerbation of diastolic congestive heart failure: Preserved EF on left heart catheterization.  Currently no signs of fluid overload.  Status post IV Lasix -Follow echocardiogram -Diuresis as appropriate -Daily weight, intake and output and BMP  Hypokalemia -Replace and recheck -Check magnesium.  Hypertensive urgency: Resolved. -BP meds  Anxiety: Stable -Home alprazolam and SSRI  GERD -Antacid  Scheduled Meds: . ALPRAZolam  1 mg Oral BID  . atorvastatin  40 mg Oral q1800  . FLUoxetine  40 mg Oral Daily  . irbesartan  37.5  mg Oral Daily  . metoprolol succinate  100 mg Oral Daily  . multivitamin with minerals  1 tablet Oral Daily  . pantoprazole  40 mg Oral Daily  . sodium chloride flush  3 mL Intravenous Q12H   Continuous Infusions: . sodium chloride 75 mL/hr at 06/26/18 1239  . sodium chloride     PRN Meds:.sodium chloride, acetaminophen, fluticasone, hydrALAZINE, nitroGLYCERIN, ondansetron (ZOFRAN) IV, sodium chloride flush  DVT prophylaxis: On heparin for an STEMI and A. fib Code Status: Full code Family Communication: No family member at bedside Disposition Plan: Transfer to telemetry  Consultants:   Cardiology  Procedures:   Left heart catheterization on 06/26/2018  Antimicrobials:  None  Objective: Vitals:   06/26/18 1245 06/26/18 1300 06/26/18 1315 06/26/18 1330  BP: 135/74 (!) 142/84 (!) 142/71 133/78  Pulse: 60 64 63 62  Resp: 16 15 14 17   Temp:      TempSrc:      SpO2: 93% (!) 85% 93% (!) 88%  Weight:      Height:        Intake/Output Summary (Last 24 hours) at 06/26/2018 1433 Last data filed at 06/26/2018 0600 Gross per 24 hour  Intake 0 ml  Output 2750 ml  Net -2750 ml   Filed Weights   06/25/18 1429 06/25/18 1957 06/26/18 0500  Weight: 126.1 kg 131.6 kg 127.7 kg    Examination:  GENERAL: appears well, no ditress HEENT: PERRLA, MMM NECK: Supple.  No JVD LUNGS:  No IWOB, good air movement, CTAB HEART:  RRR with no M/R/G ABD:  Morbidly obese, soft, NT with active BS EXT:   No significant edema NEURO:  awake, alert and oriented  appropriately.  No gross deficit PSYCH: calm. Normal affect   Data Reviewed: I have independently reviewed following labs and imaging studies.  CBC: Recent Labs  Lab 06/25/18 1527 06/26/18 0709  WBC 7.1 6.7  HGB 14.1 15.5*  HCT 46.4* 48.8*  MCV 97.9 96.8  PLT 209 254   Basic Metabolic Panel: Recent Labs  Lab 06/25/18 1527 06/26/18 0709  NA 140 137  K 3.7 3.2*  CL 96* 94*  CO2 30 31  GLUCOSE 117* 157*  BUN 16 13    CREATININE 1.03* 1.06*  CALCIUM 9.4 9.2  MG 2.0  --    GFR: Estimated Creatinine Clearance: 72.5 mL/min (A) (by C-G formula based on SCr of 1.06 mg/dL (H)). Liver Function Tests: Recent Labs  Lab 06/25/18 1527  AST 42*  ALT 43  ALKPHOS 85  BILITOT 0.9  PROT 7.0  ALBUMIN 3.8   No results for input(s): LIPASE, AMYLASE in the last 168 hours. No results for input(s): AMMONIA in the last 168 hours. Coagulation Profile: Recent Labs  Lab 06/25/18 1527  INR 1.02   Cardiac Enzymes: Recent Labs  Lab 06/25/18 2052 06/26/18 0245 06/26/18 0709  TROPONINI 1.97* 4.71* 4.96*   BNP (last 3 results) No results for input(s): PROBNP in the last 8760 hours. HbA1C: Recent Labs    06/26/18 0245  HGBA1C 5.4   CBG: Recent Labs  Lab 06/25/18 1559  GLUCAP 113*   Lipid Profile: No results for input(s): CHOL, HDL, LDLCALC, TRIG, CHOLHDL, LDLDIRECT in the last 72 hours. Thyroid Function Tests: Recent Labs    06/25/18 1527  TSH 2.310   Anemia Panel: No results for input(s): VITAMINB12, FOLATE, FERRITIN, TIBC, IRON, RETICCTPCT in the last 72 hours. Urine analysis:    Component Value Date/Time   COLORURINE COLORLESS (A) 06/25/2018 1421   APPEARANCEUR CLEAR 06/25/2018 1421   LABSPEC 1.004 (L) 06/25/2018 1421   PHURINE 8.0 06/25/2018 1421   GLUCOSEU NEGATIVE 06/25/2018 1421   HGBUR NEGATIVE 06/25/2018 1421   BILIRUBINUR NEGATIVE 06/25/2018 1421   BILIRUBINUR 1+ 04/06/2016 0856   KETONESUR NEGATIVE 06/25/2018 1421   PROTEINUR NEGATIVE 06/25/2018 1421   UROBILINOGEN 1.0 04/06/2016 0856   UROBILINOGEN 1.0 03/28/2012 1649   NITRITE NEGATIVE 06/25/2018 1421   LEUKOCYTESUR NEGATIVE 06/25/2018 1421   Sepsis Labs: Invalid input(s): PROCALCITONIN, LACTICIDVEN  No results found for this or any previous visit (from the past 240 hour(s)).    Radiology Studies: Dg Chest Portable 1 View  Result Date: 06/25/2018 CLINICAL DATA:  Acute chest pain and shortness of breath today. EXAM:  PORTABLE CHEST 1 VIEW COMPARISON:  01/05/2018 chest radiograph and prior studies dating back to 09/02/2004 FINDINGS: Cardiomegaly again noted. There is no evidence of focal airspace disease, pulmonary edema, suspicious pulmonary nodule/mass, pleural effusion, or pneumothorax. No acute bony abnormalities are identified. IMPRESSION: Cardiomegaly without evidence of acute cardiopulmonary disease. Electronically Signed   By: Margarette Canada M.D.   On: 06/25/2018 15:46   Ct Angio Chest/abd/pel For Dissection W And/or W/wo  Result Date: 06/25/2018 CLINICAL DATA:  67 year old female with acute chest and abdominal pain. New onset atrial fibrillation with tachycardia. EXAM: CT ANGIOGRAPHY CHEST, ABDOMEN AND PELVIS TECHNIQUE: Multidetector CT imaging through the chest, abdomen and pelvis was performed using the standard protocol during bolus administration of intravenous contrast. Multiplanar reconstructed images and MIPs were obtained and reviewed to evaluate the vascular anatomy. CONTRAST:  165mL ISOVUE-370 IOPAMIDOL (ISOVUE-370) INJECTION 76% COMPARISON:  06/25/2018 and prior radiographs, 02/12/2017 chest, abdomen and pelvis CTs and prior studies  FINDINGS: CTA CHEST FINDINGS Cardiovascular: This is a technically satisfactory study. There is no evidence of thoracic aortic aneurysm, dissection or pulmonary emboli. Mild cardiomegaly noted. Mild thoracic aortic atherosclerotic calcifications noted. No pericardial effusion. Mediastinum/Nodes: No enlarged mediastinal, hilar, or axillary lymph nodes. Thyroid gland, trachea, and esophagus demonstrate no significant findings. Lungs/Pleura: Lungs are clear. No pleural effusion or pneumothorax. Musculoskeletal: No chest wall abnormality. No acute or significant osseous findings. Review of the MIP images confirms the above findings. CTA ABDOMEN AND PELVIS FINDINGS VASCULAR Aorta: Mild atherosclerotic calcification without aneurysm or dissection. Celiac: Mild atherosclerotic  calcification at the origin without other significant abnormality SMA: Mild atherosclerotic calcification without other significant abnormality Renals: Atherosclerotic calcifications at the origins without other significant abnormality. IMA: Unremarkable Veins: No obvious venous abnormality within the limitations of this arterial phase study. Review of the MIP images confirms the above findings. NON-VASCULAR Artifact from bilateral hip arthroplasties limits evaluation of the pelvis. Hepatobiliary: The liver is unremarkable. The patient is status post cholecystectomy. No biliary dilatation. Pancreas: Unremarkable Spleen: Unremarkable Adrenals/Urinary Tract: The kidneys, adrenal glands and visualized bladder are unremarkable except for a LEFT renal cyst. Stomach/Bowel: Stomach is within normal limits. No evidence of bowel wall thickening, distention, or inflammatory changes. Colonic diverticulosis noted without evidence of diverticulitis. Lymphatic: No abnormal lymph nodes identified. Reproductive: Not well evaluated Other: No ascites, focal collection or pneumoperitoneum. Musculoskeletal: No acute or suspicious bony abnormalities. Bilateral hip arthroplasties again noted. Review of the MIP images confirms the above findings. IMPRESSION: 1. No evidence of acute abnormality. No evidence of aortic aneurysm/dissection or pulmonary emboli. 2. Mild cardiomegaly 3.  Aortic Atherosclerosis (ICD10-I70.0). Electronically Signed   By: Margarette Canada M.D.   On: 06/25/2018 18:12      Ennis He[facility]ner [facility] Pager [phone]  If 7PM-7AM, please contact night-coverage [email or web address] Password [email or web address] 06/26/2018, 2:33 PM

## 2018-06-27 DIAGNOSIS — I272 Pulmonary hypertension, unspecified: Secondary | ICD-10-CM

## 2018-06-27 DIAGNOSIS — R7989 Other specified abnormal findings of blood chemistry: Secondary | ICD-10-CM

## 2018-06-27 DIAGNOSIS — I248 Other forms of acute ischemic heart disease: Secondary | ICD-10-CM

## 2018-06-27 DIAGNOSIS — I10 Essential (primary) hypertension: Secondary | ICD-10-CM

## 2018-06-27 LAB — BASIC METABOLIC PANEL
Anion gap: 8 (ref 5–15)
BUN: 17 mg/dL (ref 8–23)
CO2: 34 mmol/L — ABNORMAL HIGH (ref 22–32)
Calcium: 9.2 mg/dL (ref 8.9–10.3)
Chloride: 98 mmol/L (ref 98–111)
Creatinine, Ser: 1.12 mg/dL — ABNORMAL HIGH (ref 0.44–1.00)
GFR calc Af Amer: 58 mL/min — ABNORMAL LOW (ref >60)
GFR calc non Af Amer: 50 mL/min — ABNORMAL LOW (ref >60)
Glucose, Bld: 121 mg/dL — ABNORMAL HIGH (ref 70–99)
Potassium: 4.6 mmol/L (ref 3.5–5.1)
Sodium: 140 mmol/L (ref 135–145)

## 2018-06-27 LAB — CBC
HCT: 47.8 % — ABNORMAL HIGH (ref 36.0–46.0)
Hemoglobin: 14.4 g/dL (ref 12.0–15.0)
MCH: 30.3 pg (ref 26.0–34.0)
MCHC: 30.1 g/dL (ref 30.0–36.0)
MCV: 100.4 fL — ABNORMAL HIGH (ref 80.0–100.0)
Platelets: 217 10*3/uL (ref 150–400)
RBC: 4.76 MIL/uL (ref 3.87–5.11)
RDW: 14.6 % (ref 11.5–15.5)
WBC: 6.5 10*3/uL (ref 4.0–10.5)
nRBC: 0 % (ref 0.0–0.2)

## 2018-06-27 MED ORDER — APIXABAN 5 MG PO TABS: 5.0000 mg | ORAL_TABLET | Freq: Two times a day (BID) | ORAL | 1 refills | Status: DC

## 2018-06-27 MED ORDER — APIXABAN 5 MG PO TABS
5.0000 mg | ORAL_TABLET | Freq: Two times a day (BID) | ORAL | Status: DC
  Administered 2018-06-27: 5 mg via ORAL
  Filled 2018-06-27: qty 1

## 2018-06-27 MED ORDER — ATORVASTATIN CALCIUM 40 MG PO TABS: 40.0000 mg | ORAL_TABLET | Freq: Every day | ORAL | 1 refills | Status: DC

## 2018-06-27 MED ORDER — FLUOXETINE HCL 20 MG PO CAPS: 40.0000 mg | ORAL_CAPSULE | Freq: Every day | ORAL | Status: DC

## 2018-06-27 MED FILL — ELIQUIS 5 MG TABLET: 5 | 30 days supply | Qty: 60 | Fill #0 | Status: TO

## 2018-06-27 NOTE — Progress Notes (Signed)
Progress Note  Patient Name: Kathy [name] Date of Encounter: 06/27/2018  Primary Cardiologist: No primary care provider on file. New  Subjective   Feels much better this am. No current chest pain or SOB  Inpatient Medications    Scheduled Meds: . ALPRAZolam  1 mg Oral BID  . atorvastatin  40 mg Oral q1800  . FLUoxetine  40 mg Oral Daily  . irbesartan  37.5 mg Oral Daily  . metoprolol succinate  100 mg Oral Daily  . multivitamin with minerals  1 tablet Oral Daily  . pantoprazole  40 mg Oral Daily  . sodium chloride flush  3 mL Intravenous Q12H   Continuous Infusions: . sodium chloride     PRN Meds: sodium chloride, acetaminophen, fluticasone, hydrALAZINE, nitroGLYCERIN, ondansetron (ZOFRAN) IV, sodium chloride flush   Vital Signs    Vitals:   06/26/18 2028 06/27/18 0321 06/27/18 0600 06/27/18 0815  BP: 131/69 140/80  (!) 151/82  Pulse: 70 76  73  Resp: 19 16    Temp: 98.2 F (36.8 C) 98.5 F (36.9 C)  98.4 F (36.9 C)  TempSrc: Oral Oral  Oral  SpO2: 96% 93%  99%  Weight:   129 kg   Height:       No intake or output data in the 24 hours ending 06/27/18 1025 Filed Weights   06/25/18 1957 06/26/18 0500 06/27/18 0600  Weight: 131.6 kg 127.7 kg 129 kg    Telemetry    NSR no recurrent Afib - Personally Reviewed  ECG    NSR with normal Ecg. - Personally Reviewed  Physical Exam   GEN: No acute distress.  Morbidly obese Neck: No JVD Cardiac: RRR, no murmurs, rubs, or gallops.  Respiratory: Clear to auscultation bilaterally. GI: Soft, nontender, non-distended  MS: No edema; No deformity. No radial site hematoma Neuro:  Nonfocal  Psych: Normal affect   Labs    Chemistry Recent Labs  Lab 06/25/18 1527 06/26/18 0709 06/27/18 0313  NA 140 137 140  K 3.7 3.2* 4.6  CL 96* 94* 98  CO2 30 31 34*  GLUCOSE 117* 157* 121*  BUN 16 13 17   CREATININE 1.03* 1.06* 1.12*  CALCIUM 9.4 9.2 9.2  PROT 7.0  --   --   ALBUMIN 3.8  --   --   AST 42*  --    --   ALT 43  --   --   ALKPHOS 85  --   --   BILITOT 0.9  --   --   GFRNONAA 55* 53* 50*  GFRAA >60 >60 58*  ANIONGAP 14 12 8      Hematology Recent Labs  Lab 06/25/18 1527 06/26/18 0709 06/27/18 0313  WBC 7.1 6.7 6.5  RBC 4.74 5.04 4.76  HGB 14.1 15.5* 14.4  HCT 46.4* 48.8* 47.8*  MCV 97.9 96.8 100.4*  MCH 29.7 30.8 30.3  MCHC 30.4 31.8 30.1  RDW 14.2 14.4 14.6  PLT 209 234 217    Cardiac Enzymes Recent Labs  Lab 06/25/18 2052 06/26/18 0245 06/26/18 0709  TROPONINI 1.97* 4.71* 4.96*    Recent Labs  Lab 06/25/18 1605 06/25/18 1831  TROPIPOC 0.05 0.89*     BNP Recent Labs  Lab 06/25/18 1527  BNP 620.5*     DDimer No results for input(s): DDIMER in the last 168 hours.   Radiology    Dg Chest Portable 1 View  Result Date: 06/25/2018 CLINICAL DATA:  Acute chest pain and shortness of breath today.  EXAM: PORTABLE CHEST 1 VIEW COMPARISON:  01/05/2018 chest radiograph and prior studies dating back to 09/02/2004 FINDINGS: Cardiomegaly again noted. There is no evidence of focal airspace disease, pulmonary edema, suspicious pulmonary nodule/mass, pleural effusion, or pneumothorax. No acute bony abnormalities are identified. IMPRESSION: Cardiomegaly without evidence of acute cardiopulmonary disease. Electronically Signed   By: Margarette Canada M.D.   On: 06/25/2018 15:46   Ct Angio Chest/abd/pel For Dissection W And/or W/wo  Result Date: 06/25/2018 CLINICAL DATA:  67 year old female with acute chest and abdominal pain. New onset atrial fibrillation with tachycardia. EXAM: CT ANGIOGRAPHY CHEST, ABDOMEN AND PELVIS TECHNIQUE: Multidetector CT imaging through the chest, abdomen and pelvis was performed using the standard protocol during bolus administration of intravenous contrast. Multiplanar reconstructed images and MIPs were obtained and reviewed to evaluate the vascular anatomy. CONTRAST:  16mL ISOVUE-370 IOPAMIDOL (ISOVUE-370) INJECTION 76% COMPARISON:  06/25/2018 and  prior radiographs, 02/12/2017 chest, abdomen and pelvis CTs and prior studies FINDINGS: CTA CHEST FINDINGS Cardiovascular: This is a technically satisfactory study. There is no evidence of thoracic aortic aneurysm, dissection or pulmonary emboli. Mild cardiomegaly noted. Mild thoracic aortic atherosclerotic calcifications noted. No pericardial effusion. Mediastinum/Nodes: No enlarged mediastinal, hilar, or axillary lymph nodes. Thyroid gland, trachea, and esophagus demonstrate no significant findings. Lungs/Pleura: Lungs are clear. No pleural effusion or pneumothorax. Musculoskeletal: No chest wall abnormality. No acute or significant osseous findings. Review of the MIP images confirms the above findings. CTA ABDOMEN AND PELVIS FINDINGS VASCULAR Aorta: Mild atherosclerotic calcification without aneurysm or dissection. Celiac: Mild atherosclerotic calcification at the origin without other significant abnormality SMA: Mild atherosclerotic calcification without other significant abnormality Renals: Atherosclerotic calcifications at the origins without other significant abnormality. IMA: Unremarkable Veins: No obvious venous abnormality within the limitations of this arterial phase study. Review of the MIP images confirms the above findings. NON-VASCULAR Artifact from bilateral hip arthroplasties limits evaluation of the pelvis. Hepatobiliary: The liver is unremarkable. The patient is status post cholecystectomy. No biliary dilatation. Pancreas: Unremarkable Spleen: Unremarkable Adrenals/Urinary Tract: The kidneys, adrenal glands and visualized bladder are unremarkable except for a LEFT renal cyst. Stomach/Bowel: Stomach is within normal limits. No evidence of bowel wall thickening, distention, or inflammatory changes. Colonic diverticulosis noted without evidence of diverticulitis. Lymphatic: No abnormal lymph nodes identified. Reproductive: Not well evaluated Other: No ascites, focal collection or pneumoperitoneum.  Musculoskeletal: No acute or suspicious bony abnormalities. Bilateral hip arthroplasties again noted. Review of the MIP images confirms the above findings. IMPRESSION: 1. No evidence of acute abnormality. No evidence of aortic aneurysm/dissection or pulmonary emboli. 2. Mild cardiomegaly 3.  Aortic Atherosclerosis (ICD10-I70.0). Electronically Signed   By: Margarette Canada M.D.   On: 06/25/2018 18:12    Cardiac Studies   pending  Patient Profile     67 y.o. female with history of HTN and obesity presents with acute chest pain and AFib with RVR.   Assessment & Plan    1. NSTEMI. Troponin peak 4.96. Ecg without acute ST changes. Based on cath findings this is demand ischemia in setting of severe HTN and AFib with RVR.  2. AFib with RVR. Paroxysmal. Current management with rate control with beta blockade.  Plan initiation of DOAC. [place] vasc of 3. Will continue metoprolol for rate control. If she were to have recurrent Afib may need to consider AAD therapy or ablation. Recommend outpatient sleep study to evaluate for sleep apena. 3. Hypertensive urgency. BP now improved with beta blocker, ARB. 4. Hypercholesterolemia last LDL 146. Will start statin therapy.  5.  Pulmonary HTN by Echo. I reviewed personally. 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 Risk factors include obesity and HTN. Need to evaluate for OSA. No evidence of PE by CT. I would recommend repeating Echo in steady state to reevaluate pulmonary pressures. If still high may consider a right heart cath in the future.   She is stable for DC home today from a cardiac standpoint recommend follow up in our office in 2-3 weeks.      For questions or updates, please contact [place] Please consult [email or web address] for contact info under        Signed, Seng Fouts [place], MD  06/27/2018, 10:25 AM

## 2018-06-27 NOTE — Care Management Note (Signed)
Case Management Note Marvetta Gibbons RN, BSN Transitions of Care Unit 4E- RN Case Manager [phone]  Patient Details  Name: Kathy [name] MRN: [id number] Date of Birth: 14-Sep-1951  Subjective/Objective:    Pt admitted with new onset afib RVR                 Action/Plan: PTA pt lived at home, independent- referral received for [facility] needs- benefits check submitted, however unfortunately unable to contact insurance for coverage info- per MD decision to start pt on Eliquis- [facility] pharmacy to fill medication and deliver to bedside prior to discharge.   Expected Discharge Date:  06/27/18               Expected Discharge Plan:  Home/Self Care  In-House Referral:  NA  Discharge planning Services  CM Consult, Medication Assistance  Post Acute Care Choice:  NA Choice offered to:  NA  DME Arranged:    DME Agency:     HH Arranged:    HH Agency:     Status of Service:  Completed, signed off  If discussed at [place] of Stay Meetings, dates discussed:    Discharge Disposition: home/self care   Additional Comments:  Dawayne Patricia, RN 06/27/2018, 4:11 PM

## 2018-06-27 NOTE — Discharge Summary (Signed)
Physician Discharge Summary  Kathy [name] XBM:[id number] DOB: 11-04-1950 DOA: 06/25/2018  PCP: Marletta Lor, MD  Admit date: 06/25/2018 Discharge date: 06/27/2018  Admitted From: Home  Disposition:  Home   Recommendations for Outpatient Follow-up:  1. Follow up with PCP in 1 week 2. Follow up with Cardiology in 3-4 weeks 3. Please refer for outpatient sleep study given obesity, pHTN, Afib       Home Health: None  Equipment/Devices: None  Discharge Condition: Good  CODE STATUS: FULL Diet recommendation: Cardiac  Brief/Interim Summary: Mrs. Bolte is a 67 y.o. F with obesity, HTN, and anxiety who presented with chest pain, found to have new onset atrial fibrillation with RVR as well as troponinemia.        Discharge Diagnoses:   New onset atrial fibrillation, paroxysmal CHA2DS2-VASc Score = 3 (age, F, HTN).  HASBLED 1.  Treated with IV pushes of metoprolol and spontaneously converted to sinus rhythm.  Continued on home dose metoprolol and remained in sinus rhythm, rates 60s.  TSH normal.  Echo with normal valves, normal LVSF, elevated estimated pulmonary pressures (markedly) and some RVSD.   Started on Eliquis.  Evaluated by Cardiology, has Cardiology follow up arranged.  Unable to confirm insurance copay for DOAC.  Provided with 30 days Eliquis.  If Eliquis is not covered well by insurance: 1. Can transition to rivaroxaban by taking first dose of rivaroxaban at time of next scheduled Eliquis dose is due, without overlap 2. Can transition to warfarin with overlap of 3 days vs overlap until INR >2 (with INR drawn before Eliquis is taken, to minimize effect of Eliquis on INR    Elevated troponin LHC performed, no obstructive disease.  Likely demand ischemia with Afib, hypertension.  Hypertensive crisis Blood pressure 170s to 180s on admission.  Treated with home Benicar and Metoprolol with additional hydralazine pushes.  BP normalized.     Hypercholesterolemia LDL 146.  Crestor started.  Anxiety Has long history of anxiety and alprazolam use.  Will run out of medication soon.        Discharge Instructions  Discharge Instructions    Diet - low sodium heart healthy   Complete by:  As directed    Discharge instructions   Complete by:  As directed    From Dr. Loleta Books: You were admitted with chest pain, found to have atrial fibrillation with fast heart rate.  Atrial fibrillation or "Afib" is a condition where the heart beats irregularly and sometimes fast, it is also associated with strokes. We were initially concerned that you were having a heart attack, and so you had a "heart cath" where they showed definitively that you have only mild fatty plaque buildup in your arteries of your heart, not a heart attack. We think that all your symptoms were from your Afib.  With your normal metoprolol, your heart rate returned to normal, and actually went back into a regular normal rhythm.  It may come and go periodically. Your cholesterol was high on testing here, and Cardiology have recommended treatment with a statin medicine, atorvastatin.    With atrial fibrillation, comes a risk of stroke, and for this reason, with your personalized risk of stroke, we would recommend a blood thinner. This is a lifelong medicine to take.   You should be attentive to symptoms like feeling weak/tired or pale, and for signs like blood in stool or black, sticky, tar-like stools.  If you notice any fo this, call your doctor immediately.  Follow up with  the Cardiology office soon.  I have sent this referral and they should contact you in the next few days.  Call Dr. Marthann Schiller office if you haven't heard from them by the end of next week and you need help with the Cardiology referral.  You should have Dr. Marthann Schiller office refill your Eliquis. Please call your insurance this week to confirm your copay for Eliquis.      Call Dr. Marthann Schiller office for a hospital  follow up next week with one of the PAs, rather than waiting until your appointment with Dr. Jerilee Hoh in November.  We recommend that you be screened for sleep apnea.   Increase activity slowly   Complete by:  As directed      Allergies as of 06/27/2018      Reactions   Penicillins    ? Facial angioedema   Sulfamethoxazole-trimethoprim    hives   Achromycin [tetracycline Hcl]    Dyspnea   Nickel    Two hip replacement   Zanaflex [tizanidine Hydrochloride]    Causes more pain       Medication List    STOP taking these medications   aspirin 325 MG tablet   ibuprofen 800 MG tablet Commonly known as:  ADVIL,MOTRIN     TAKE these medications   albuterol 108 (90 Base) MCG/ACT inhaler Commonly known as:  PROVENTIL HFA;VENTOLIN HFA Inhale 2 puffs into the lungs every 6 (six) hours as needed for wheezing or shortness of breath.   ALPRAZolam 1 MG tablet Commonly known as:  XANAX Take 1 tablet (1 mg total) by mouth 2 (two) times daily.   apixaban 5 MG Tabs tablet Commonly known as:  ELIQUIS Take 1 tablet (5 mg total) by mouth 2 (two) times daily.   atorvastatin 40 MG tablet Commonly known as:  LIPITOR Take 1 tablet (40 mg total) by mouth daily at 6 PM.   CALCIUM PO Take 1,000 mg by mouth daily.   clobetasol cream 0.05 % Commonly known as:  TEMOVATE Apply 1 application topically as needed (Psoriasis).   cyclobenzaprine 10 MG tablet Commonly known as:  FLEXERIL Take 1 tablet (10 mg total) by mouth 4 (four) times daily as needed. SPASMS What changed:    reasons to take this  additional instructions   dicyclomine 10 MG capsule Commonly known as:  BENTYL Take 1 tab by mouth 2-3 times daily as needed for abdominal discomfort. What changed:    how much to take  how to take this  when to take this  additional instructions   famotidine-calcium carbonate-magnesium hydroxide 10-800-165 MG chewable tablet Commonly known as:  PEPCID COMPLETE 1 -2 tablets as  needed to prevent or treat heartburn What changed:    how much to take  how to take this  when to take this  reasons to take this  additional instructions   FLUoxetine 40 MG capsule Commonly known as:  PROZAC TAKE 1 CAPSULE BY MOUTH EVERY DAY What changed:  how much to take   fluticasone 50 MCG/ACT nasal spray Commonly known as:  FLONASE Place 2 sprays into both nostrils daily. What changed:    when to take this  reasons to take this   metoprolol succinate 100 MG 24 hr tablet Commonly known as:  TOPROL-XL TAKE 1 TABLET DAILY WITH OR IMMEDIATELY FOLLOWING A MEAL What changed:  See the new instructions.   multivitamin tablet Take 1 tablet by mouth daily.   olmesartan 40 MG tablet Commonly known as:  BENICAR TAKE  1 TABLET BY MOUTH EVERY DAY   omeprazole 40 MG capsule Commonly known as:  PRILOSEC TAKE 1 CAPSULE BY MOUTH EVERY DAY What changed:    how much to take  how to take this  when to take this  additional instructions       Allergies  Allergen Reactions  . Penicillins     ? Facial angioedema   . Sulfamethoxazole-Trimethoprim     hives  . Achromycin [Tetracycline Hcl]     Dyspnea    . Nickel     Two hip replacement  . Zanaflex [Tizanidine Hydrochloride]     Causes more pain     Consultations:  Cardiology   Procedures/Studies: Dg Chest Portable 1 View  Result Date: 06/25/2018 CLINICAL DATA:  Acute chest pain and shortness of breath today. EXAM: PORTABLE CHEST 1 VIEW COMPARISON:  01/05/2018 chest radiograph and prior studies dating back to 09/02/2004 FINDINGS: Cardiomegaly again noted. There is no evidence of focal airspace disease, pulmonary edema, suspicious pulmonary nodule/mass, pleural effusion, or pneumothorax. No acute bony abnormalities are identified. IMPRESSION: Cardiomegaly without evidence of acute cardiopulmonary disease. Electronically Signed   By: Margarette Canada M.D.   On: 06/25/2018 15:46   Ct Angio Chest/abd/pel For  Dissection W And/or W/wo  Result Date: 06/25/2018 CLINICAL DATA:  67 year old female with acute chest and abdominal pain. New onset atrial fibrillation with tachycardia. EXAM: CT ANGIOGRAPHY CHEST, ABDOMEN AND PELVIS TECHNIQUE: Multidetector CT imaging through the chest, abdomen and pelvis was performed using the standard protocol during bolus administration of intravenous contrast. Multiplanar reconstructed images and MIPs were obtained and reviewed to evaluate the vascular anatomy. CONTRAST:  163mL ISOVUE-370 IOPAMIDOL (ISOVUE-370) INJECTION 76% COMPARISON:  06/25/2018 and prior radiographs, 02/12/2017 chest, abdomen and pelvis CTs and prior studies FINDINGS: CTA CHEST FINDINGS Cardiovascular: This is a technically satisfactory study. There is no evidence of thoracic aortic aneurysm, dissection or pulmonary emboli. Mild cardiomegaly noted. Mild thoracic aortic atherosclerotic calcifications noted. No pericardial effusion. Mediastinum/Nodes: No enlarged mediastinal, hilar, or axillary lymph nodes. Thyroid gland, trachea, and esophagus demonstrate no significant findings. Lungs/Pleura: Lungs are clear. No pleural effusion or pneumothorax. Musculoskeletal: No chest wall abnormality. No acute or significant osseous findings. Review of the MIP images confirms the above findings. CTA ABDOMEN AND PELVIS FINDINGS VASCULAR Aorta: Mild atherosclerotic calcification without aneurysm or dissection. Celiac: Mild atherosclerotic calcification at the origin without other significant abnormality SMA: Mild atherosclerotic calcification without other significant abnormality Renals: Atherosclerotic calcifications at the origins without other significant abnormality. IMA: Unremarkable Veins: No obvious venous abnormality within the limitations of this arterial phase study. Review of the MIP images confirms the above findings. NON-VASCULAR Artifact from bilateral hip arthroplasties limits evaluation of the pelvis. Hepatobiliary: The  liver is unremarkable. The patient is status post cholecystectomy. No biliary dilatation. Pancreas: Unremarkable Spleen: Unremarkable Adrenals/Urinary Tract: The kidneys, adrenal glands and visualized bladder are unremarkable except for a LEFT renal cyst. Stomach/Bowel: Stomach is within normal limits. No evidence of bowel wall thickening, distention, or inflammatory changes. Colonic diverticulosis noted without evidence of diverticulitis. Lymphatic: No abnormal lymph nodes identified. Reproductive: Not well evaluated Other: No ascites, focal collection or pneumoperitoneum. Musculoskeletal: No acute or suspicious bony abnormalities. Bilateral hip arthroplasties again noted. Review of the MIP images confirms the above findings. IMPRESSION: 1. No evidence of acute abnormality. No evidence of aortic aneurysm/dissection or pulmonary emboli. 2. Mild cardiomegaly 3.  Aortic Atherosclerosis (ICD10-I70.0). Electronically Signed   By: Margarette Canada M.D.   On: 06/25/2018 18:12   Echocardiogram  10/9 Study Conclusions  - Left ventricle: The cavity size was normal. There was severe   concentric hypertrophy. Systolic function was normal. Wall motion   was normal; there were no regional wall motion abnormalities.   Features are consistent with a pseudonormal left ventricular   filling pattern, with concomitant abnormal relaxation and   increased filling pressure (grade 2 diastolic dysfunction).   Doppler parameters are consistent with high ventricular filling   pressure. - Aortic valve: Trileaflet; normal thickness, mildly calcified   leaflets. There was trivial regurgitation. - Mitral valve: There was trivial regurgitation. - Left atrium: The atrium was mildly dilated. - Right ventricle: The cavity size was moderately dilated. Wall   thickness was normal. Systolic function was mildly to moderately   reduced. - Right atrium: The atrium was moderately dilated. - Tricuspid valve: There was moderate  regurgitation. - Pulmonic valve: There was trivial regurgitation. - Pulmonary arteries: PA peak pressure: 84 mm Hg (S).  Impressions:  - The right ventricular systolic pressure was increased consistent   with severe pulmonary hypertension.     Left heart cath 10/9  Ost RCA lesion is 30% stenosed.  The left ventricular systolic function is normal.  LV end diastolic pressure is mildly elevated.  The left ventricular ejection fraction is 55-65% by visual estimate.   1.  Mild nonobstructive coronary artery disease. 2.  Normal LV systolic function and mildly elevated left ventricular end-diastolic pressure.  Recommendations: Elevated troponin most likely due to supply demand ischemia related to A. fib with RVR.  Oral anticoagulation for A. fib can be started tomorrow.       Subjective: Some fleeting minor chest pains at rest, no exertional pain or dyspnea.  No cough, sputum.  No confusion,fever.  No palpitations, swelling.  Discharge Exam: Vitals:   06/27/18 0321 06/27/18 0815  BP: 140/80 (!) 151/82  Pulse: 76 73  Resp: 16   Temp: 98.5 F (36.9 C) 98.4 F (36.9 C)  SpO2: 93% 99%   Vitals:   06/26/18 2028 06/27/18 0321 06/27/18 0600 06/27/18 0815  BP: 131/69 140/80  (!) 151/82  Pulse: 70 76  73  Resp: 19 16    Temp: 98.2 F (36.8 C) 98.5 F (36.9 C)  98.4 F (36.9 C)  TempSrc: Oral Oral  Oral  SpO2: 96% 93%  99%  Weight:   129 kg   Height:        General: Pt is alert, awake, not in acute distress, lying in bed, interactive Cardiovascular: Heart regular, rate normal, S1/S2 +, no rubs, no gallops Respiratory: CTA bilaterally, no wheezing, no rhonchi Abdominal: Soft, NT, ND, bowel sounds + Extremities: no edema, no cyanosis    The results of significant diagnostics from this hospitalization (including imaging, microbiology, ancillary and laboratory) are listed below for reference.     Microbiology: No results found for this or any previous visit  (from the past 240 hour(s)).   Labs: BNP (last 3 results) Recent Labs    06/25/18 1527  BNP 347.4*   Basic Metabolic Panel: Recent Labs  Lab 06/25/18 1527 06/26/18 0709 06/26/18 1602 06/27/18 0313  NA 140 137  --  140  K 3.7 3.2*  --  4.6  CL 96* 94*  --  98  CO2 30 31  --  34*  GLUCOSE 117* 157*  --  121*  BUN 16 13  --  17  CREATININE 1.03* 1.06*  --  1.12*  CALCIUM 9.4 9.2  --  9.2  MG  2.0  --  1.9  --    Liver Function Tests: Recent Labs  Lab 06/25/18 1527  AST 42*  ALT 43  ALKPHOS 85  BILITOT 0.9  PROT 7.0  ALBUMIN 3.8   No results for input(s): LIPASE, AMYLASE in the last 168 hours. No results for input(s): AMMONIA in the last 168 hours. CBC: Recent Labs  Lab 06/25/18 1527 06/26/18 0709 06/27/18 0313  WBC 7.1 6.7 6.5  HGB 14.1 15.5* 14.4  HCT 46.4* 48.8* 47.8*  MCV 97.9 96.8 100.4*  PLT 209 234 217   Cardiac Enzymes: Recent Labs  Lab 06/25/18 2052 06/26/18 0245 06/26/18 0709  TROPONINI 1.97* 4.71* 4.96*   BNP: Invalid input(s): POCBNP CBG: Recent Labs  Lab 06/25/18 1559  GLUCAP 113*   D-Dimer No results for input(s): DDIMER in the last 72 hours. Hgb A1c Recent Labs    06/26/18 0245  HGBA1C 5.4   Lipid Profile No results for input(s): CHOL, HDL, LDLCALC, TRIG, CHOLHDL, LDLDIRECT in the last 72 hours. Thyroid function studies Recent Labs    06/26/18 1602  TSH 1.782   Anemia work up No results for input(s): VITAMINB12, FOLATE, FERRITIN, TIBC, IRON, RETICCTPCT in the last 72 hours. Urinalysis    Component Value Date/Time   COLORURINE COLORLESS (A) 06/25/2018 1421   APPEARANCEUR CLEAR 06/25/2018 1421   LABSPEC 1.004 (L) 06/25/2018 1421   PHURINE 8.0 06/25/2018 1421   GLUCOSEU NEGATIVE 06/25/2018 1421   HGBUR NEGATIVE 06/25/2018 1421   BILIRUBINUR NEGATIVE 06/25/2018 1421   BILIRUBINUR 1+ 04/06/2016 0856   KETONESUR NEGATIVE 06/25/2018 1421   PROTEINUR NEGATIVE 06/25/2018 1421   UROBILINOGEN 1.0 04/06/2016 0856    UROBILINOGEN 1.0 03/28/2012 1649   NITRITE NEGATIVE 06/25/2018 1421   LEUKOCYTESUR NEGATIVE 06/25/2018 1421   Sepsis Labs Invalid input(s): PROCALCITONIN,  WBC,  LACTICIDVEN Microbiology No results found for this or any previous visit (from the past 240 hour(s)).   Time coordinating discharge: 40 minutes       SIGNED:   Edwin Dada, MD  [facility] 06/27/2018, 4:02 PM

## 2018-06-28 ENCOUNTER — Telehealth: Payer: Self-pay | Admitting: Family Medicine

## 2018-06-28 NOTE — Care Management (Signed)
#   3.   S/W  CHRIS  @  [facility] [employer] [facility] # [phone]   1.ELIQUIS  5 MG BID COVER- YES CO-PAY- $ 45.00 TIER- 3 DRUG PRIOR APPROVAL- NO  2. XARELTO  20 MG DAILY COVER- YES CO-PAY-  45.00 TIER- 3 DRUG PRIOR APPROVAL- NO  PREFERRED PHARMACY : YES    [facility]

## 2018-06-28 NOTE — Consult Note (Signed)
            [facility] CM Primary Care Navigator  06/28/2018  Kathy [name] Mar 09, 1951 [id number]   Went to seepatient at the bedside to identify possible discharge needs but she was alreadydischargedhomeper staff.  Per MD note,patientpresented with chest pain, found to have new onset atrial fibrillation with rapid ventricular response, hypertensive crisis and troponinemia.  Primary care provider's office is listed as providing transition of care (TOC) follow-up.   Patient has discharge instruction to follow-up withprimary care provider in 1 week and cardiology follow-up in 3- 4 weeks.  Primary care provider's office called [employer] account planner for [place]) to notify of discharge;need for post hospital follow-up and transition of care (TOC). Notified ofpatient's health issues needing close follow-up and made aware to refer patient to [facility] care management if deemed necessary and appropriate for any services.   For additional questions please contact:  Edwena Felty A. Eulas Schweitzer, BSN, RN-BC [facility] PRIMARY CARE Navigator Cell: [phone]

## 2018-06-28 NOTE — Telephone Encounter (Signed)
Transition Care Management Follow-up Telephone Call   Date discharged? 06/27/18   How have you been since you were released from the hospital? "I feel weak"   Do you understand why you were in the hospital? yes   Do you understand the discharge instructions? yes   Where were you discharged to? Home   Items Reviewed:  Medications reviewed: yes  Allergies reviewed: yes  Dietary changes reviewed: yes, low sodium diet  Referrals reviewed: yes   Functional Questionnaire:   Activities of Daily Living (ADLs):   She states they are independent in the following: ambulation, bathing and hygiene, feeding, continence, grooming, toileting and dressing States they require assistance with the following: No assistance needed   Any transportation issues/concerns?: no   Any patient concerns? She is on new medications and concerned when she shoul take them.  Also wanted to know if she should be taking Lasix.   Confirmed importance and date/time of follow-up visits scheduled yes  Provider Appointment booked with Dr. Sarajane Jews at 11:15  Confirmed with patient if condition begins to worsen call PCP or go to the ER.  Patient was given the office number and encouraged to call back with question or concerns.  : yes

## 2018-07-01 ENCOUNTER — Encounter: Payer: Self-pay | Admitting: Family Medicine

## 2018-07-01 ENCOUNTER — Ambulatory Visit (INDEPENDENT_AMBULATORY_CARE_PROVIDER_SITE_OTHER): Payer: Medicare Other | Admitting: Family Medicine

## 2018-07-01 VITALS — BP 130/80 | HR 64 | Temp 98.0°F | Wt 281.4 lb

## 2018-07-01 DIAGNOSIS — I4891 Unspecified atrial fibrillation: Secondary | ICD-10-CM

## 2018-07-01 MED ORDER — ALPRAZOLAM 1 MG PO TABS: 1.0000 mg | ORAL_TABLET | Freq: Four times a day (QID) | ORAL | 1 refills | Status: DC

## 2018-07-01 NOTE — Progress Notes (Signed)
   Subjective:    Patient ID: Kathy [name], female    DOB: [birth year], 67 y.o.   MRN: [id number]  HPI Here to follow up on a hospital stay from 06-25-18 to 06-27-18 for new onset atrial fibrillation. She presented with chest pain and SOB, and was found to be in atrial fibrillation. She had a chest CT angiogram tat was normal . She had an ECHO that showed elevated pulmonary pressures but was otherwise normal with good systolic function. She had an elevated troponin level but this was found to be due to demand ischemia. She had a cardiac catheterization which showed a 30% stenosis of the RCA, but was otherwise clear. Her renal function was normal. She went in and out of atrial fibrillation but her BP and ventricular rates were well controlled. She was sent home on Eliquis. Since going home she has been fatigued but in general feels well. No chest discomfort or SOB. She is scheduled see Dr. Jerilee Hoh on 08-13-18 to establish primary care here.    Review of Systems  Constitutional: Negative.   Respiratory: Negative.   Cardiovascular: Negative.   Gastrointestinal: Negative.   Genitourinary: Negative.   Neurological: Negative.        Objective:   Physical Exam  Constitutional: She is oriented to person, place, and time. She appears well-developed and well-nourished.  Neck: No thyromegaly present.  Cardiovascular: Normal rate, regular rhythm, normal heart sounds and intact distal pulses.  Pulmonary/Chest: Effort normal and breath sounds normal. No stridor. No respiratory distress. She has no wheezes. She has no rales.  Lymphadenopathy:    She has no cervical adenopathy.  Neurological: She is alert and oriented to person, place, and time.          Assessment & Plan:  Recent onset of atrial fibrillation, presently in sinus rhythm. She will remain on her current medications including Eliquis 5 mg bid. We will refer her to be established with Cardiology soon.  Alysia Penna, MD

## 2018-07-02 ENCOUNTER — Telehealth: Payer: Self-pay | Admitting: Family Medicine

## 2018-07-02 DIAGNOSIS — I10 Essential (primary) hypertension: Secondary | ICD-10-CM

## 2018-07-02 DIAGNOSIS — I4891 Unspecified atrial fibrillation: Secondary | ICD-10-CM

## 2018-07-02 NOTE — Telephone Encounter (Signed)
Called and spoke to [place].  She stated the pt does qualify for [facility].  Edwena Felty thinks a referral for her recent hospital admission would be beneficial and hopefully keep the pt out of the hospital again.  Please advise if referral is ok.  Pt is establishing with Dr. Jerilee Hoh in Nov.

## 2018-07-02 NOTE — Telephone Encounter (Signed)
Copied from [place] [id number]. Topic: Quick Communication - See Telephone Encounter >> Jun 28, 2018 10:53 AM Reyne Dumas L wrote: Cleon Gustin with [facility] calling.  Needs to speak with Sheena about transition of care for this pt. Please call [place] at [phone]. >> Jul 01, 2018  8:55 AM Miles Costain T, CMA wrote: Left a message for Edwena Felty to return my call.

## 2018-07-02 NOTE — Telephone Encounter (Signed)
Please refer her to [facility]

## 2018-07-03 NOTE — Addendum Note (Signed)
Addended by: Miles Costain T on: 07/03/2018 04:29 PM   Modules accepted: Orders

## 2018-07-03 NOTE — Telephone Encounter (Signed)
Referral placed.

## 2018-07-04 NOTE — Addendum Note (Signed)
Addended by: Miles Costain T on: 07/04/2018 03:45 PM   Modules accepted: Orders

## 2018-07-17 ENCOUNTER — Other Ambulatory Visit: Payer: Self-pay | Admitting: Physician Assistant

## 2018-07-18 ENCOUNTER — Ambulatory Visit: Payer: Self-pay | Admitting: *Deleted

## 2018-07-18 DIAGNOSIS — R600 Localized edema: Secondary | ICD-10-CM | POA: Diagnosis not present

## 2018-07-18 DIAGNOSIS — Z79899 Other long term (current) drug therapy: Secondary | ICD-10-CM | POA: Diagnosis not present

## 2018-07-18 DIAGNOSIS — F329 Major depressive disorder, single episode, unspecified: Secondary | ICD-10-CM | POA: Diagnosis not present

## 2018-07-18 DIAGNOSIS — R011 Cardiac murmur, unspecified: Secondary | ICD-10-CM | POA: Diagnosis not present

## 2018-07-18 DIAGNOSIS — I4891 Unspecified atrial fibrillation: Secondary | ICD-10-CM | POA: Diagnosis not present

## 2018-07-18 DIAGNOSIS — Z9109 Other allergy status, other than to drugs and biological substances: Secondary | ICD-10-CM | POA: Diagnosis not present

## 2018-07-18 DIAGNOSIS — Z882 Allergy status to sulfonamides status: Secondary | ICD-10-CM | POA: Diagnosis not present

## 2018-07-18 DIAGNOSIS — Z883 Allergy status to other anti-infective agents status: Secondary | ICD-10-CM | POA: Diagnosis not present

## 2018-07-18 DIAGNOSIS — Z7901 Long term (current) use of anticoagulants: Secondary | ICD-10-CM | POA: Diagnosis not present

## 2018-07-18 DIAGNOSIS — I10 Essential (primary) hypertension: Secondary | ICD-10-CM | POA: Diagnosis not present

## 2018-07-18 DIAGNOSIS — R0602 Shortness of breath: Secondary | ICD-10-CM | POA: Diagnosis not present

## 2018-07-18 DIAGNOSIS — Z888 Allergy status to other drugs, medicaments and biological substances status: Secondary | ICD-10-CM | POA: Diagnosis not present

## 2018-07-18 DIAGNOSIS — Z880 Allergy status to penicillin: Secondary | ICD-10-CM | POA: Diagnosis not present

## 2018-07-18 DIAGNOSIS — R635 Abnormal weight gain: Secondary | ICD-10-CM | POA: Diagnosis not present

## 2018-07-18 NOTE — Telephone Encounter (Signed)
Correction-Patient has a 16 lb weight increase over 6 days.

## 2018-07-18 NOTE — Telephone Encounter (Signed)
FYI-Patient with substantial 6 day weight gain over 6 days and out of town, advised UC or ER. Stated she will go at this time.

## 2018-07-18 NOTE — Telephone Encounter (Signed)
Patient recently diagnosed with AFIB. She is out of town and is reporting a weight gain over the last 6 days, of 16 lbs. Today, she has noticed some mild SOB after an activity/exertion. Reports her face looks puffy. Denies CP/wheezing/increased voiding. Left ankle and foot with mild edema she reports this is not new and happens often due to prior melanoma of the area. She admits to mild dietary changes while on vacation this week but noticing she can't take a really deep breath in is concerning.She is not currently on antidiuretic.  With the new diagnosis and large weight gain over 6 days and out of town, referred her to [facility] at this time.   Reason for Disposition . [1] MILD difficulty breathing (e.g., minimal/no SOB at rest, SOB with walking, pulse <100) AND [2] NEW-onset or WORSE than normal  Answer Assessment - Initial Assessment Questions 1. RESPIRATORY STATUS: "Describe your breathing?" (e.g., wheezing, shortness of breath, unable to speak, severe coughing)      normal 2. ONSET: "When did this breathing problem begin?"      Today, but increased weight gain over one week 3. PATTERN "Does the difficult breathing come and go, or has it been constant since it started?"      Comes and goes 4. SEVERITY: "How bad is your breathing?" (e.g., mild, moderate, severe)    - MILD: No SOB at rest, mild SOB with walking, speaks normally in sentences, can lay down, no retractions, pulse < 100.    - MODERATE: SOB at rest, SOB with minimal exertion and prefers to sit, cannot lie down flat, speaks in phrases, mild retractions, audible wheezing, pulse 100-120.    - SEVERE: Very SOB at rest, speaks in single words, struggling to breathe, sitting hunched forward, retractions, pulse > 120      mild 5. RECURRENT SYMPTOM: "Have you had difficulty breathing before?" If so, ask: "When was the last time?" and "What happened that time?"      Yes, recently diagnosed with afib and hospitalized with increased fluid 6.  CARDIAC HISTORY: "Do you have any history of heart disease?" (e.g., heart attack, angina, bypass surgery, angioplasty)      afib on eliquis 7. LUNG HISTORY: "Do you have any history of lung disease?"  (e.g., pulmonary embolus, asthma, emphysema)    no 8. CAUSE: "What do you think is causing the breathing problem?"      Fluid accumulating. 9. OTHER SYMPTOMS: "Do you have any other symptoms? (e.g., dizziness, runny nose, cough, chest pain, fever)     no 10. PREGNANCY: "Is there any chance you are pregnant?" "When was your last menstrual period?"       no 11. TRAVEL: "Have you traveled out of the country in the last month?" (e.g., travel history, exposures)       no  Protocols used: BREATHING DIFFICULTY-A-AH

## 2018-07-22 NOTE — Telephone Encounter (Signed)
Please advise 

## 2018-07-23 NOTE — Telephone Encounter (Signed)
Spoke to pt and informed her to make an appt for possible edema. Pt verbalized understanding and will call back to schedule an appt.

## 2018-07-29 ENCOUNTER — Ambulatory Visit (INDEPENDENT_AMBULATORY_CARE_PROVIDER_SITE_OTHER): Payer: Medicare Other | Admitting: Family Medicine

## 2018-07-29 ENCOUNTER — Encounter: Payer: Self-pay | Admitting: Family Medicine

## 2018-07-29 VITALS — BP 138/80 | HR 73 | Temp 98.9°F | Wt 287.4 lb

## 2018-07-29 DIAGNOSIS — R600 Localized edema: Secondary | ICD-10-CM

## 2018-07-29 DIAGNOSIS — I4891 Unspecified atrial fibrillation: Secondary | ICD-10-CM | POA: Diagnosis not present

## 2018-07-29 DIAGNOSIS — I10 Essential (primary) hypertension: Secondary | ICD-10-CM

## 2018-07-29 MED ORDER — FUROSEMIDE 20 MG PO TABS: 20.0000 mg | ORAL_TABLET | Freq: Every day | ORAL | 2 refills | Status: DC

## 2018-07-29 NOTE — Progress Notes (Signed)
   Subjective:    Patient ID: Kathy [name], female    DOB: 1950-10-04, 67 y.o.   MRN: [id number]  HPI Here to follow up an ER visit on 07-18-18. She presented that day with a 16 lb weight gain in one week, swelling in the legs and hands, and SOB. Her BP on arrival was 230/110. Otherwise her EKG was fine, labs looked okay, and a CXR was clear. They did not change her medications and told her to follow up with her PCP. Since then the weight has stabilized, she is no longer SOB, and her BP has come down. She still has swelling in the legs and feet and hands. She is very conscious of her sodium intake.    Review of Systems  Constitutional: Negative.   Respiratory: Negative.   Cardiovascular: Positive for leg swelling. Negative for chest pain and palpitations.  Neurological: Negative.        Objective:   Physical Exam  Constitutional: She is oriented to person, place, and time. She appears well-developed and well-nourished.  Cardiovascular: Normal rate, regular rhythm, normal heart sounds and intact distal pulses.  Pulmonary/Chest: Effort normal and breath sounds normal. No stridor. No respiratory distress. She has no wheezes. She has no rales.  Musculoskeletal:  2+ edema in both legs up to the knees   Neurological: She is alert and oriented to person, place, and time.          Assessment & Plan:  Leg edema. We will start her on Lasix 20 mg daily. She is in sinus rhythm with no further evidence of atrial fibrillation since her spontaneous conversion a few weeks ago. Her BP and ventricular rate are stable. She is due to see Dr. Acie Fredrickson in Cardiology on 08-07-18. Kathy Penna, MD

## 2018-07-30 ENCOUNTER — Other Ambulatory Visit: Payer: Self-pay | Admitting: Internal Medicine

## 2018-08-06 ENCOUNTER — Ambulatory Visit: Payer: Medicare Other | Admitting: Internal Medicine

## 2018-08-07 ENCOUNTER — Ambulatory Visit (INDEPENDENT_AMBULATORY_CARE_PROVIDER_SITE_OTHER): Payer: Medicare Other | Admitting: Cardiovascular Disease

## 2018-08-07 ENCOUNTER — Encounter: Payer: Self-pay | Admitting: Cardiovascular Disease

## 2018-08-07 VITALS — BP 108/66 | HR 78 | Ht 67.0 in | Wt 283.0 lb

## 2018-08-07 DIAGNOSIS — I272 Pulmonary hypertension, unspecified: Secondary | ICD-10-CM | POA: Diagnosis not present

## 2018-08-07 DIAGNOSIS — I5032 Chronic diastolic (congestive) heart failure: Secondary | ICD-10-CM | POA: Diagnosis not present

## 2018-08-07 DIAGNOSIS — G4730 Sleep apnea, unspecified: Secondary | ICD-10-CM

## 2018-08-07 MED ORDER — FUROSEMIDE 20 MG PO TABS: 20.0000 mg | ORAL_TABLET | Freq: Every day | ORAL | 3 refills | Status: DC

## 2018-08-07 MED ORDER — APIXABAN 5 MG PO TABS: 5.0000 mg | ORAL_TABLET | Freq: Two times a day (BID) | ORAL | 3 refills | Status: DC

## 2018-08-07 MED ORDER — ATORVASTATIN CALCIUM 40 MG PO TABS: 40.0000 mg | ORAL_TABLET | Freq: Every day | ORAL | 3 refills | Status: DC

## 2018-08-07 NOTE — Progress Notes (Signed)
Cardiology Office Note:    Date:  08/07/2018   ID:  Kathy [name], DOB 25-Jul-1951, MRN [id number]  PCP:  Isaac Bliss, Rayford Halsted, MD  Cardiologist:  Mertie Moores, MD  Electrophysiologist:  None   Referring MD: Laurey Morale, MD   Problem List 1.  Chronic diastolic CHF 2. Morbid obesity 3. Severe pulmonary HTN 4. Mild CAD  5.   Chief Complaint  Patient presents with  . Atrial Fibrillation        Kathy [name] is a 67 y.o. female with a hx of  MVP, chest pain  Hyperlipidemia and atrial fib We are asked to see her by Isaac Bliss, Rayford Halsted, MD for further eval of her atrial fib  I have seen her many years ago .   Developed CP,  Called EMS ECG at home showed atrial fib  She was admitted to the hospital with mid sternal chest pain , atrial fib, HTN.  Troponin levels rose to 4.96.    cath showed mild irreg. CT angiogram of the chest showed no aortic dissection  Echo shows  Normal LV systolic function - severe concnetric LCH Grade 2 diastolic dysfunction   Severe pulmonary HTN with estimated PA pressure of 84.   Gained 16 lbs after her hospitalization Was started on Lasix by Dr. Sharlene Motts  BP is now under control.  Breathing seems to be better Does not do any exercise ( limited by orthopedic issues )     Past Medical History:  Diagnosis Date  . Adenomatous colon polyp    2012  . Anxiety   . Anxiety and depression   . Arthritis    Osteoarthritis, Dr Maureen Ralphs  . Depression   . Diverticulosis   . Fibromyalgia    Dr Tobie Lords, Rheu  . GERD (gastroesophageal reflux disease)   . H/O hiatal hernia   . Headache(784.0)    PAST HX MILD MIGRAINES  . Heart murmur    mitral valve prolapse--no symptoms  . History of colonic polyps 08/08/2011       . HTN (hypertension)   . IBS (irritable bowel syndrome)   . Internal hemorrhoid   . Lumbar radiculopathy   . Obesity   . Syncope    idiopathic, ? related to migraines or hyperventilation. Dr  Leonie Man, Neurology,  ONE TIME EPISODE AND THIS WAS YEARS AGO    Past Surgical History:  Procedure Laterality Date  . ABDOMINAL HYSTERECTOMY     dysfunctional menses  . APPENDECTOMY    . BREAST EXCISIONAL BIOPSY Right   . CARDIAC CATHETERIZATION  06/26/2018  . CHOLECYSTECTOMY    . COLONOSCOPY  05/27/2002, 08/08/11   2003 diverticulosis, hemorrhoids 2012 same + cecal polyp  . KNEE ARTHROSCOPY  2008   right  . LEFT HEART CATH AND CORONARY ANGIOGRAPHY N/A 06/26/2018   Procedure: LEFT HEART CATH AND CORONARY ANGIOGRAPHY;  Surgeon: Wellington Hampshire, MD;  Location: [facility];  Service: Cardiovascular;  Laterality: N/A;  . MELANOMA EXCISION Left 07/2013   foot  . TOTAL HIP ARTHROPLASTY  02/2010   left ; Dr Maureen Ralphs  . TOTAL HIP ARTHROPLASTY  03/18/2012   Procedure: TOTAL HIP ARTHROPLASTY;  Surgeon: Gearlean Alf, MD;  Location: [facility];  Service: Orthopedics;  Laterality: Right;  . UPPER GASTROINTESTINAL ENDOSCOPY  04/21/2009, 08/08/11   2010 gastritis ;2012 small hiatal hernia. Dr Carlean Purl    Current Medications: Current Meds  Medication Sig  . albuterol (PROVENTIL HFA;VENTOLIN HFA) 108 (90 Base) MCG/ACT inhaler  Inhale 2 puffs into the lungs every 6 (six) hours as needed for wheezing or shortness of breath.  . ALPRAZolam (XANAX) 1 MG tablet Take 1 tablet (1 mg total) by mouth 4 (four) times daily.  Marland Kitchen apixaban (ELIQUIS) 5 MG TABS tablet Take 1 tablet (5 mg total) by mouth 2 (two) times daily.  Marland Kitchen atorvastatin (LIPITOR) 40 MG tablet Take 1 tablet (40 mg total) by mouth daily at 6 PM.  . CALCIUM PO Take 1,000 mg by mouth daily.   . clobetasol cream (TEMOVATE) 3.87 % Apply 1 application topically as needed (Psoriasis).   . cyclobenzaprine (FLEXERIL) 10 MG tablet Take 1 tablet (10 mg total) by mouth 4 (four) times daily as needed. SPASMS (Patient taking differently: Take 10 mg by mouth 4 (four) times daily as needed for muscle spasms. )  . famotidine-calcium carbonate-magnesium hydroxide  (PEPCID COMPLETE) 10-800-165 MG CHEW chewable tablet 1 -2 tablets as needed to prevent or treat heartburn (Patient taking differently: Chew 1-2 tablets by mouth daily as needed (heartburn). )  . FLUoxetine (PROZAC) 40 MG capsule TAKE 1 CAPSULE BY MOUTH EVERY DAY (Patient taking differently: Take 40 mg by mouth daily. )  . fluticasone (FLONASE) 50 MCG/ACT nasal spray Place 2 sprays into both nostrils daily.  . furosemide (LASIX) 20 MG tablet Take 1 tablet (20 mg total) by mouth daily.  . metoprolol succinate (TOPROL-XL) 100 MG 24 hr tablet Take 1 tablet (100 mg total) by mouth daily.  . Multiple Vitamin (MULTIVITAMIN) tablet Take 1 tablet by mouth daily.  Marland Kitchen olmesartan (BENICAR) 40 MG tablet TAKE 1 TABLET BY MOUTH EVERY DAY (Patient taking differently: Take 40 mg by mouth daily. )  . omeprazole (PRILOSEC) 40 MG capsule TAKE 1 CAPSULE BY MOUTH EVERY DAY (Patient taking differently: Take 40 mg by mouth daily. )  . [DISCONTINUED] apixaban (ELIQUIS) 5 MG TABS tablet Take 1 tablet (5 mg total) by mouth 2 (two) times daily.  . [DISCONTINUED] atorvastatin (LIPITOR) 40 MG tablet Take 1 tablet (40 mg total) by mouth daily at 6 PM.  . [DISCONTINUED] furosemide (LASIX) 20 MG tablet Take 1 tablet (20 mg total) by mouth daily.     Allergies:   Penicillins; Sulfamethoxazole-trimethoprim; Achromycin [tetracycline hcl]; Nickel; and Zanaflex [tizanidine hydrochloride]   Social History   Socioeconomic History  . Marital status: Married    Spouse name: Not on file  . Number of children: Not on file  . Years of education: Not on file  . Highest education level: Not on file  Occupational History  . Not on file  Social Needs  . Financial resource strain: Not on file  . Food insecurity:    Worry: Not on file    Inability: Not on file  . Transportation needs:    Medical: Not on file    Non-medical: Not on file  Tobacco Use  . Smoking status: Never Smoker  . Smokeless tobacco: Never Used  Substance and  Sexual Activity  . Alcohol use: Yes    Comment: occasional  . Drug use: No  . Sexual activity: Not on file  Lifestyle  . Physical activity:    Days per week: Not on file    Minutes per session: Not on file  . Stress: Not on file  Relationships  . Social connections:    Talks on phone: Not on file    Gets together: Not on file    Attends religious service: Not on file    Active member of club or  organization: Not on file    Attends meetings of clubs or organizations: Not on file    Relationship status: Not on file  Other Topics Concern  . Not on file  Social History Narrative   0 caffeine drinks      Family History: The patient's family history includes Breast cancer in her paternal aunt; Breast cancer (age of onset: 16) in her mother; COPD in her maternal grandmother and mother; Diabetes in her father and paternal grandmother; Heart attack (age of onset: 45) in her brother; Heart attack (age of onset: 65) in her mother; Heart disease in her paternal grandmother; Heart failure (age of onset: 27) in her father; Hypertension in her father; Stroke in her paternal grandfather. There is no history of Colon cancer.  ROS:   Please see the history of present illness.     All other systems reviewed and are negative.  EKGs/Labs/Other Studies Reviewed:    The following studies were reviewed today:    Recent Labs: 06/25/2018: ALT 43; B Natriuretic Peptide 620.5 06/26/2018: Magnesium 1.9; TSH 1.782 06/27/2018: BUN 17; Creatinine, Ser 1.12; Hemoglobin 14.4; Platelets 217; Potassium 4.6; Sodium 140  Recent Lipid Panel    Component Value Date/Time   CHOL 235 (H) 04/30/2018 1134   TRIG 169.0 (H) 04/30/2018 1134   HDL 54.70 04/30/2018 1134   CHOLHDL 4 04/30/2018 1134   VLDL 33.8 04/30/2018 1134   LDLCALC 146 (H) 04/30/2018 1134   LDLDIRECT 143.2 01/22/2013 0856    Physical Exam:    VS:  BP 108/66   Pulse 78   Ht 5\' 7"  (1.702 m)   Wt 283 lb (128.4 kg)   SpO2 96%   BMI 44.32  kg/m     Wt Readings from Last 3 Encounters:  08/07/18 283 lb (128.4 kg)  07/29/18 287 lb 6 oz (130.4 kg)  07/01/18 281 lb 7 oz (127.7 kg)     GEN: morbidly obese female  HEENT: Normal NECK: No JVD; No carotid bruits LYMPHATICS: No lymphadenopathy CARDIAC: RRR, no murmurs, rubs, gallops RESPIRATORY:  Clear to auscultation without rales, wheezing or rhonchi  ABDOMEN: Soft, non-tender, non-distended MUSCULOSKELETAL:  No edema; No deformity  SKIN: Warm and dry NEUROLOGIC:  Alert and oriented x 3 PSYCHIATRIC:  Normal affect   EKG:     ASSESSMENT:    1. Pulmonary hypertension, unspecified ([place])   2. Chronic diastolic CHF (congestive heart failure) ([place])   3. Sleep apnea, unspecified type    PLAN:    In order of problems listed above:  1. Chronic diastolic congestive heart failure: Geni Bers presented with chest pain, atrial for ablation and symptoms of heart failure.  She was found to have normal left ventricular systolic function but with grade 2 diastolic dysfunction. She seems to be doing better on the Lasix 20 mg a day. He is to avoid salt in her diet.  I have encouraged her to exercise but she has multiple orthopedic limitations. Urged her to work on a weight loss program.  2.  Pulmonary hypertension: She has severe pulmonary hypertension.  This may be due to her diastolic dysfunction.  She has a history of morbid obesity and is scheduled to have a sleep study.   Repeat her echocardiogram in 3 months.  If her pulmonary pressures do not decrease significantly, will refer her to the advanced heart failure clinic to see Dr. Haroldine Laws. Will order a sleep study .   3.  Paroxysmal atrial fibrillation: Patient was admitted with paroxysmal A. fib.  She has maintained normal sinus rhythm since that time.  Continue Eliquis 5 mg twice a day. May have OSA.  Will get a sleep study   4.  Coronary artery disease: Heart catheterization revealed very minimal coronary artery disease.  5.   Morbid obesity :   Encouraged weight loss.      Medication Adjustments/Labs and Tests Ordered: Current medicines are reviewed at length with the patient today.  Concerns regarding medicines are outlined above.  Orders Placed This Encounter  Procedures  . ECHOCARDIOGRAM COMPLETE  . Split night study   Meds ordered this encounter  Medications  . furosemide (LASIX) 20 MG tablet    Sig: Take 1 tablet (20 mg total) by mouth daily.    Dispense:  90 tablet    Refill:  3  . apixaban (ELIQUIS) 5 MG TABS tablet    Sig: Take 1 tablet (5 mg total) by mouth 2 (two) times daily.    Dispense:  180 tablet    Refill:  3  . atorvastatin (LIPITOR) 40 MG tablet    Sig: Take 1 tablet (40 mg total) by mouth daily at 6 PM.    Dispense:  90 tablet    Refill:  3    Patient Instructions  Medication Instructions:  Your physician recommends that you continue on your current medications as directed. Please refer to the Current Medication list given to you today.  If you need a refill on your cardiac medications before your next appointment, please call your pharmacy.    Lab work: None Ordered    Testing/Procedures: Your physician has requested that you have an echocardiogram in 3 months prior to appointment with Dr. Acie Fredrickson. Echocardiography is a painless test that uses sound waves to create images of your heart. It provides your doctor with information about the size and shape of your heart and how well your heart's chambers and valves are working. This procedure takes approximately one hour. There are no restrictions for this procedure.  Your physician has recommended that you have a sleep study. This test records several body functions during sleep, including: brain activity, eye movement, oxygen and carbon dioxide blood levels, heart rate and rhythm, breathing rate and rhythm, the flow of air through your mouth and nose, snoring, body muscle movements, and chest and belly  movement.    Follow-Up: At [facility], you and your health needs are our priority.  As part of our continuing mission to provide you with exceptional heart care, we have created designated Provider Care Teams.  These Care Teams include your primary Cardiologist (physician) and Advanced Practice Providers (APPs -  Physician Assistants and Nurse Practitioners) who all work together to provide you with the care you need, when you need it. You will need a follow up appointment in:  3 months.  Please call our office 2 months in advance to schedule this appointment.  You may see Mertie Moores, MD or one of the following Advanced Practice Providers on your designated Care Team: Richardson Dopp, PA-C [place], [place] . Daune Perch, NP     Low-Sodium Eating Plan Sodium, which is an element that makes up salt, helps you maintain a healthy balance of fluids in your body. Too much sodium can increase your blood pressure and cause fluid and waste to be held in your body. Your health care provider or dietitian may recommend following this plan if you have high blood pressure (hypertension), kidney disease, liver disease, or heart failure. Eating less sodium can  help lower your blood pressure, reduce swelling, and protect your heart, liver, and kidneys. What are tips for following this plan? General guidelines  Most people on this plan should limit their sodium intake to 1,500-2,000 mg (milligrams) of sodium each day. Reading food labels  The Nutrition Facts label lists the amount of sodium in one serving of the food. If you eat more than one serving, you must multiply the listed amount of sodium by the number of servings.  Choose foods with less than 140 mg of sodium per serving.  Avoid foods with 300 mg of sodium or more per serving. Shopping  Look for lower-sodium products, often labeled as "low-sodium" or "no salt added."  Always check the sodium content even if foods are labeled as  "unsalted" or "no salt added".  Buy fresh foods. ? Avoid canned foods and premade or frozen meals. ? Avoid canned, cured, or processed meats  Buy breads that have less than 80 mg of sodium per slice. Cooking  Eat more home-cooked food and less restaurant, buffet, and fast food.  Avoid adding salt when cooking. Use salt-free seasonings or herbs instead of table salt or sea salt. Check with your health care provider or pharmacist before using salt substitutes.  Cook with plant-based oils, such as canola, sunflower, or olive oil. Meal planning  When eating at a restaurant, ask that your food be prepared with less salt or no salt, if possible.  Avoid foods that contain MSG (monosodium glutamate). MSG is sometimes added to [place] food, bouillon, and some canned foods. What foods are recommended? The items listed may not be a complete list. Talk with your dietitian about what dietary choices are best for you. Grains Low-sodium cereals, including oats, puffed wheat and rice, and shredded wheat. Low-sodium crackers. Unsalted rice. Unsalted pasta. Low-sodium bread. Whole-grain breads and whole-grain pasta. Vegetables Fresh or frozen vegetables. "No salt added" canned vegetables. "No salt added" tomato sauce and paste. Low-sodium or reduced-sodium tomato and vegetable juice. Fruits Fresh, frozen, or canned fruit. Fruit juice. Meats and other protein foods Fresh or frozen (no salt added) meat, poultry, seafood, and fish. Low-sodium canned tuna and salmon. Unsalted nuts. Dried peas, beans, and lentils without added salt. Unsalted canned beans. Eggs. Unsalted nut butters. Dairy Milk. Soy milk. Cheese that is naturally low in sodium, such as ricotta cheese, fresh mozzarella, or Swiss cheese Low-sodium or reduced-sodium cheese. Cream cheese. Yogurt. Fats and oils Unsalted butter. Unsalted margarine with no trans fat. Vegetable oils such as canola or olive oils. Seasonings and other foods Fresh  and dried herbs and spices. Salt-free seasonings. Low-sodium mustard and ketchup. Sodium-free salad dressing. Sodium-free light mayonnaise. Fresh or refrigerated horseradish. Lemon juice. Vinegar. Homemade, reduced-sodium, or low-sodium soups. Unsalted popcorn and pretzels. Low-salt or salt-free chips. What foods are not recommended? The items listed may not be a complete list. Talk with your dietitian about what dietary choices are best for you. Grains Instant hot cereals. Bread stuffing, pancake, and biscuit mixes. Croutons. Seasoned rice or pasta mixes. Noodle soup cups. Boxed or frozen macaroni and cheese. Regular salted crackers. Self-rising flour. Vegetables Sauerkraut, pickled vegetables, and relishes. Olives. [place] fries. Onion rings. Regular canned vegetables (not low-sodium or reduced-sodium). Regular canned tomato sauce and paste (not low-sodium or reduced-sodium). Regular tomato and vegetable juice (not low-sodium or reduced-sodium). Frozen vegetables in sauces. Meats and other protein foods Meat or fish that is salted, canned, smoked, spiced, or pickled. Bacon, ham, sausage, hotdogs, corned beef, chipped beef, packaged lunch meats,  salt pork, jerky, pickled herring, anchovies, regular canned tuna, sardines, salted nuts. Dairy Processed cheese and cheese spreads. Cheese curds. Blue cheese. Feta cheese. String cheese. Regular cottage cheese. Buttermilk. Canned milk. Fats and oils Salted butter. Regular margarine. Ghee. Bacon fat. Seasonings and other foods Onion salt, garlic salt, seasoned salt, table salt, and sea salt. Canned and packaged gravies. Worcestershire sauce. Tartar sauce. Barbecue sauce. Teriyaki sauce. Soy sauce, including reduced-sodium. Steak sauce. Fish sauce. Oyster sauce. Cocktail sauce. Horseradish that you find on the shelf. Regular ketchup and mustard. Meat flavorings and tenderizers. Bouillon cubes. Hot sauce and Tabasco sauce. Premade or packaged marinades. Premade  or packaged taco seasonings. Relishes. Regular salad dressings. Salsa. Potato and tortilla chips. Corn chips and puffs. Salted popcorn and pretzels. Canned or dried soups. Pizza. Frozen entrees and pot pies. Summary  Eating less sodium can help lower your blood pressure, reduce swelling, and protect your heart, liver, and kidneys.  Most people on this plan should limit their sodium intake to 1,500-2,000 mg (milligrams) of sodium each day.  Canned, boxed, and frozen foods are high in sodium. Restaurant foods, fast foods, and pizza are also very high in sodium. You also get sodium by adding salt to food.  Try to cook at home, eat more fresh fruits and vegetables, and eat less fast food, canned, processed, or prepared foods. This information is not intended to replace advice given to you by your health care provider. Make sure you discuss any questions you have with your health care provider. Document Released: 02/24/2002 Document Revised: 08/28/2016 Document Reviewed: 08/28/2016 Elsevier Interactive Patient Education  2018 [place], Mertie Moores, MD  08/07/2018 6:06 PM    [employer]

## 2018-08-07 NOTE — Patient Instructions (Addendum)
Medication Instructions:  Your physician recommends that you continue on your current medications as directed. Please refer to the Current Medication list given to you today.  If you need a refill on your cardiac medications before your next appointment, please call your pharmacy.    Lab work: None Ordered    Testing/Procedures: Your physician has requested that you have an echocardiogram in 3 months prior to appointment with Dr. Acie Fredrickson. Echocardiography is a painless test that uses sound waves to create images of your heart. It provides your doctor with information about the size and shape of your heart and how well your heart's chambers and valves are working. This procedure takes approximately one hour. There are no restrictions for this procedure.  Your physician has recommended that you have a sleep study. This test records several body functions during sleep, including: brain activity, eye movement, oxygen and carbon dioxide blood levels, heart rate and rhythm, breathing rate and rhythm, the flow of air through your mouth and nose, snoring, body muscle movements, and chest and belly movement.    Follow-Up: At [facility], you and your health needs are our priority.  As part of our continuing mission to provide you with exceptional heart care, we have created designated Provider Care Teams.  These Care Teams include your primary Cardiologist (physician) and Advanced Practice Providers (APPs -  Physician Assistants and Nurse Practitioners) who all work together to provide you with the care you need, when you need it. You will need a follow up appointment in:  3 months.  Please call our office 2 months in advance to schedule this appointment.  You may see Mertie Moores, MD or one of the following Advanced Practice Providers on your designated Care Team: Richardson Dopp, PA-C [place], [place] . Daune Perch, NP     Low-Sodium Eating Plan Sodium, which is an element that makes up  salt, helps you maintain a healthy balance of fluids in your body. Too much sodium can increase your blood pressure and cause fluid and waste to be held in your body. Your health care provider or dietitian may recommend following this plan if you have high blood pressure (hypertension), kidney disease, liver disease, or heart failure. Eating less sodium can help lower your blood pressure, reduce swelling, and protect your heart, liver, and kidneys. What are tips for following this plan? General guidelines  Most people on this plan should limit their sodium intake to 1,500-2,000 mg (milligrams) of sodium each day. Reading food labels  The Nutrition Facts label lists the amount of sodium in one serving of the food. If you eat more than one serving, you must multiply the listed amount of sodium by the number of servings.  Choose foods with less than 140 mg of sodium per serving.  Avoid foods with 300 mg of sodium or more per serving. Shopping  Look for lower-sodium products, often labeled as "low-sodium" or "no salt added."  Always check the sodium content even if foods are labeled as "unsalted" or "no salt added".  Buy fresh foods. ? Avoid canned foods and premade or frozen meals. ? Avoid canned, cured, or processed meats  Buy breads that have less than 80 mg of sodium per slice. Cooking  Eat more home-cooked food and less restaurant, buffet, and fast food.  Avoid adding salt when cooking. Use salt-free seasonings or herbs instead of table salt or sea salt. Check with your health care provider or pharmacist before using salt substitutes.  Cook with plant-based  oils, such as canola, sunflower, or olive oil. Meal planning  When eating at a restaurant, ask that your food be prepared with less salt or no salt, if possible.  Avoid foods that contain MSG (monosodium glutamate). MSG is sometimes added to [place] food, bouillon, and some canned foods. What foods are recommended? The items  listed may not be a complete list. Talk with your dietitian about what dietary choices are best for you. Grains Low-sodium cereals, including oats, puffed wheat and rice, and shredded wheat. Low-sodium crackers. Unsalted rice. Unsalted pasta. Low-sodium bread. Whole-grain breads and whole-grain pasta. Vegetables Fresh or frozen vegetables. "No salt added" canned vegetables. "No salt added" tomato sauce and paste. Low-sodium or reduced-sodium tomato and vegetable juice. Fruits Fresh, frozen, or canned fruit. Fruit juice. Meats and other protein foods Fresh or frozen (no salt added) meat, poultry, seafood, and fish. Low-sodium canned tuna and salmon. Unsalted nuts. Dried peas, beans, and lentils without added salt. Unsalted canned beans. Eggs. Unsalted nut butters. Dairy Milk. Soy milk. Cheese that is naturally low in sodium, such as ricotta cheese, fresh mozzarella, or Swiss cheese Low-sodium or reduced-sodium cheese. Cream cheese. Yogurt. Fats and oils Unsalted butter. Unsalted margarine with no trans fat. Vegetable oils such as canola or olive oils. Seasonings and other foods Fresh and dried herbs and spices. Salt-free seasonings. Low-sodium mustard and ketchup. Sodium-free salad dressing. Sodium-free light mayonnaise. Fresh or refrigerated horseradish. Lemon juice. Vinegar. Homemade, reduced-sodium, or low-sodium soups. Unsalted popcorn and pretzels. Low-salt or salt-free chips. What foods are not recommended? The items listed may not be a complete list. Talk with your dietitian about what dietary choices are best for you. Grains Instant hot cereals. Bread stuffing, pancake, and biscuit mixes. Croutons. Seasoned rice or pasta mixes. Noodle soup cups. Boxed or frozen macaroni and cheese. Regular salted crackers. Self-rising flour. Vegetables Sauerkraut, pickled vegetables, and relishes. Olives. [place] fries. Onion rings. Regular canned vegetables (not low-sodium or reduced-sodium). Regular  canned tomato sauce and paste (not low-sodium or reduced-sodium). Regular tomato and vegetable juice (not low-sodium or reduced-sodium). Frozen vegetables in sauces. Meats and other protein foods Meat or fish that is salted, canned, smoked, spiced, or pickled. Bacon, ham, sausage, hotdogs, corned beef, chipped beef, packaged lunch meats, salt pork, jerky, pickled herring, anchovies, regular canned tuna, sardines, salted nuts. Dairy Processed cheese and cheese spreads. Cheese curds. Blue cheese. Feta cheese. String cheese. Regular cottage cheese. Buttermilk. Canned milk. Fats and oils Salted butter. Regular margarine. Ghee. Bacon fat. Seasonings and other foods Onion salt, garlic salt, seasoned salt, table salt, and sea salt. Canned and packaged gravies. Worcestershire sauce. Tartar sauce. Barbecue sauce. Teriyaki sauce. Soy sauce, including reduced-sodium. Steak sauce. Fish sauce. Oyster sauce. Cocktail sauce. Horseradish that you find on the shelf. Regular ketchup and mustard. Meat flavorings and tenderizers. Bouillon cubes. Hot sauce and Tabasco sauce. Premade or packaged marinades. Premade or packaged taco seasonings. Relishes. Regular salad dressings. Salsa. Potato and tortilla chips. Corn chips and puffs. Salted popcorn and pretzels. Canned or dried soups. Pizza. Frozen entrees and pot pies. Summary  Eating less sodium can help lower your blood pressure, reduce swelling, and protect your heart, liver, and kidneys.  Most people on this plan should limit their sodium intake to 1,500-2,000 mg (milligrams) of sodium each day.  Canned, boxed, and frozen foods are high in sodium. Restaurant foods, fast foods, and pizza are also very high in sodium. You also get sodium by adding salt to food.  Try to cook at home, eat  more fresh fruits and vegetables, and eat less fast food, canned, processed, or prepared foods. This information is not intended to replace advice given to you by your health care  provider. Make sure you discuss any questions you have with your health care provider. Document Released: 02/24/2002 Document Revised: 08/28/2016 Document Reviewed: 08/28/2016 Elsevier Interactive Patient Education  [employer].

## 2018-08-08 ENCOUNTER — Telehealth: Payer: Self-pay | Admitting: *Deleted

## 2018-08-08 ENCOUNTER — Other Ambulatory Visit: Payer: Self-pay | Admitting: Internal Medicine

## 2018-08-08 NOTE — Telephone Encounter (Signed)
Staff message sent to Nina ok to schedule sleep study. Patient has Medicare and does not require a PA. 

## 2018-08-08 NOTE — Telephone Encounter (Signed)
-----   Message from Emmaline Life, RN sent at 08/07/2018 11:58 AM EST ----- Dr. Acie Fredrickson is referring patient for sleep study for sleep apnea, obesity Epworth score is 13  Thank you, Sharyn Lull

## 2018-08-13 ENCOUNTER — Ambulatory Visit (INDEPENDENT_AMBULATORY_CARE_PROVIDER_SITE_OTHER): Payer: Medicare Other | Admitting: Internal Medicine

## 2018-08-13 ENCOUNTER — Encounter: Payer: Self-pay | Admitting: Internal Medicine

## 2018-08-13 VITALS — BP 110/80 | HR 74 | Temp 97.7°F | Wt 281.9 lb

## 2018-08-13 DIAGNOSIS — I480 Paroxysmal atrial fibrillation: Secondary | ICD-10-CM | POA: Diagnosis not present

## 2018-08-13 DIAGNOSIS — I10 Essential (primary) hypertension: Secondary | ICD-10-CM | POA: Diagnosis not present

## 2018-08-13 DIAGNOSIS — Z86718 Personal history of other venous thrombosis and embolism: Secondary | ICD-10-CM

## 2018-08-13 DIAGNOSIS — Z85820 Personal history of malignant melanoma of skin: Secondary | ICD-10-CM | POA: Diagnosis not present

## 2018-08-13 DIAGNOSIS — Z86010 Personal history of colonic polyps: Secondary | ICD-10-CM

## 2018-08-13 DIAGNOSIS — F419 Anxiety disorder, unspecified: Secondary | ICD-10-CM

## 2018-08-13 DIAGNOSIS — K219 Gastro-esophageal reflux disease without esophagitis: Secondary | ICD-10-CM

## 2018-08-13 DIAGNOSIS — C4370 Malignant melanoma of unspecified lower limb, including hip: Secondary | ICD-10-CM

## 2018-08-13 MED ORDER — ALPRAZOLAM 1 MG PO TABS: ORAL_TABLET | ORAL | 1 refills | Status: DC

## 2018-08-13 NOTE — Progress Notes (Signed)
Established Patient Office Visit     CC/Reason for Visit: To establish care and for follow-up on chronic medical conditions  HPI: Kathy [name] is a 67 y.o. female who is coming in today for the above mentioned reasons. Due for annual Medicare wellness visit in 04/2019. Past Medical History is significant for: History of anxiety, fibromyalgia followed by rheumatology, hypertension and new diagnosis of atrial fibrillation.  She was hospitalized in October with chest pain and found to have new onset atrial fibrillation and elevated troponin thought secondary to rates.  Has been taking Eliquis for anticoagulation and metoprolol for rate control.  Has already seen cardiology post discharge.  Was started on Lasix 20 mg daily due to lower extremity edema.  Hypertension has been stable.  She has a diagnosis of anxiety and has been taking alprazolam 1 mg 4 times a day.  It sounds like her social situation is complex due to history of alcoholism in family members.  She has no acute complaints on today's visit.   Past Medical/Surgical History: Past Medical History:  Diagnosis Date  . Adenomatous colon polyp    2012  . Anxiety   . Anxiety and depression   . Arthritis    Osteoarthritis, Dr Maureen Ralphs  . Depression   . Diverticulosis   . Fibromyalgia    Dr Tobie Lords, Rheu  . GERD (gastroesophageal reflux disease)   . H/O hiatal hernia   . Headache(784.0)    PAST HX MILD MIGRAINES  . Heart murmur    mitral valve prolapse--no symptoms  . History of colonic polyps 08/08/2011       . HTN (hypertension)   . IBS (irritable bowel syndrome)   . Internal hemorrhoid   . Lumbar radiculopathy   . Obesity   . Syncope    idiopathic, ? related to migraines or hyperventilation. Dr Leonie Man, Neurology,  ONE TIME EPISODE AND THIS WAS YEARS AGO    Past Surgical History:  Procedure Laterality Date  . ABDOMINAL HYSTERECTOMY     dysfunctional menses  . APPENDECTOMY    . BREAST EXCISIONAL BIOPSY  Right   . CARDIAC CATHETERIZATION  06/26/2018  . CHOLECYSTECTOMY    . COLONOSCOPY  05/27/2002, 08/08/11   2003 diverticulosis, hemorrhoids 2012 same + cecal polyp  . KNEE ARTHROSCOPY  2008   right  . LEFT HEART CATH AND CORONARY ANGIOGRAPHY N/A 06/26/2018   Procedure: LEFT HEART CATH AND CORONARY ANGIOGRAPHY;  Surgeon: Wellington Hampshire, MD;  Location: [facility];  Service: Cardiovascular;  Laterality: N/A;  . MELANOMA EXCISION Left 07/2013   foot  . TOTAL HIP ARTHROPLASTY  02/2010   left ; Dr Maureen Ralphs  . TOTAL HIP ARTHROPLASTY  03/18/2012   Procedure: TOTAL HIP ARTHROPLASTY;  Surgeon: Gearlean Alf, MD;  Location: [facility];  Service: Orthopedics;  Laterality: Right;  . UPPER GASTROINTESTINAL ENDOSCOPY  04/21/2009, 08/08/11   2010 gastritis ;2012 small hiatal hernia. Dr Carlean Purl    Social History:  reports that she has never smoked. She has never used smokeless tobacco. She reports that she drinks alcohol. She reports that she does not use drugs.  Allergies: Allergies  Allergen Reactions  . Penicillins     ? Facial angioedema   . Sulfamethoxazole-Trimethoprim     hives  . Achromycin [Tetracycline Hcl]     Dyspnea    . Nickel     Two hip replacement  . Zanaflex [Tizanidine Hydrochloride]     Causes more pain  Family History:  Family History  Problem Relation Age of Onset  . Heart attack Mother 44  . COPD Mother   . Breast cancer Mother 17  . Hypertension Father   . Diabetes Father   . Heart failure Father 70  . Breast cancer Paternal Aunt        cns mets  . COPD Maternal Grandmother   . Heart disease Paternal Grandmother        died 41  . Diabetes Paternal Grandmother   . Stroke Paternal Grandfather        in late 58s  . Heart attack Brother 29  . Colon cancer Neg Hx      Current Outpatient Medications:  .  albuterol (PROVENTIL HFA;VENTOLIN HFA) 108 (90 Base) MCG/ACT inhaler, Inhale 2 puffs into the lungs every 6 (six) hours as needed for wheezing or  shortness of breath., Disp: 1 Inhaler, Rfl: 2 .  ALPRAZolam (XANAX) 1 MG tablet, Take 1 mg 4 times daily for 2 weeks, then 1 mg 3 times daily for 2 weeks and then keep at 1 mg twice daily thereafter., Disp: 100 tablet, Rfl: 1 .  apixaban (ELIQUIS) 5 MG TABS tablet, Take 1 tablet (5 mg total) by mouth 2 (two) times daily., Disp: 180 tablet, Rfl: 3 .  atorvastatin (LIPITOR) 40 MG tablet, Take 1 tablet (40 mg total) by mouth daily at 6 PM., Disp: 90 tablet, Rfl: 3 .  CALCIUM PO, Take 1,000 mg by mouth daily. , Disp: , Rfl:  .  clobetasol cream (TEMOVATE) 8.29 %, Apply 1 application topically as needed (Psoriasis). , Disp: , Rfl:  .  cyclobenzaprine (FLEXERIL) 10 MG tablet, Take 1 tablet (10 mg total) by mouth 4 (four) times daily as needed. SPASMS (Patient taking differently: Take 10 mg by mouth 4 (four) times daily as needed for muscle spasms. ), Disp: 30 tablet, Rfl: 5 .  famotidine-calcium carbonate-magnesium hydroxide (PEPCID COMPLETE) 10-800-165 MG CHEW chewable tablet, 1 -2 tablets as needed to prevent or treat heartburn (Patient taking differently: Chew 1-2 tablets by mouth daily as needed (heartburn). ), Disp: , Rfl:  .  FLUoxetine (PROZAC) 40 MG capsule, TAKE 1 CAPSULE BY MOUTH EVERY DAY (Patient taking differently: Take 40 mg by mouth daily. ), Disp: 90 capsule, Rfl: 0 .  fluticasone (FLONASE) 50 MCG/ACT nasal spray, Place 2 sprays into both nostrils daily., Disp: 16 g, Rfl: 6 .  furosemide (LASIX) 20 MG tablet, Take 1 tablet (20 mg total) by mouth daily., Disp: 90 tablet, Rfl: 3 .  metoprolol succinate (TOPROL-XL) 100 MG 24 hr tablet, Take 1 tablet (100 mg total) by mouth daily., Disp: 90 tablet, Rfl: 0 .  Multiple Vitamin (MULTIVITAMIN) tablet, Take 1 tablet by mouth daily., Disp: , Rfl:  .  olmesartan (BENICAR) 40 MG tablet, Take 1 tablet (40 mg total) by mouth daily., Disp: 90 tablet, Rfl: 0 .  omeprazole (PRILOSEC) 40 MG capsule, TAKE 1 CAPSULE BY MOUTH EVERY DAY (Patient taking  differently: Take 40 mg by mouth daily. ), Disp: 90 capsule, Rfl: 3  Review of Systems:  Constitutional: Denies fever, chills, diaphoresis, appetite change and fatigue.  HEENT: Denies photophobia, eye pain, redness, hearing loss, ear pain, congestion, sore throat, rhinorrhea, sneezing, mouth sores, trouble swallowing, neck pain, neck stiffness and tinnitus.   Respiratory: Denies SOB, DOE, cough, chest tightness,  and wheezing.   Cardiovascular: Denies chest pain, palpitations and leg swelling.  Gastrointestinal: Denies nausea, vomiting, abdominal pain, diarrhea, constipation, blood in stool and abdominal  distention.  Genitourinary: Denies dysuria, urgency, frequency, hematuria, flank pain and difficulty urinating.  Endocrine: Denies: hot or cold intolerance, sweats, changes in hair or nails, polyuria, polydipsia. Musculoskeletal: Denies myalgias, back pain, joint swelling, arthralgias and gait problem.  Skin: Denies pallor, rash and wound.  Neurological: Denies dizziness, seizures, syncope, weakness, light-headedness, numbness and headaches.  Hematological: Denies adenopathy. Easy bruising, personal or family bleeding history  Psychiatric/Behavioral: Denies suicidal ideation, mood changes, confusion, nervousness, sleep disturbance and agitation    Physical Exam: Vitals:   08/13/18 1615  BP: 110/80  Pulse: 74  Temp: 97.7 F (36.5 C)  TempSrc: Oral  SpO2: 95%  Weight: 281 lb 14.4 oz (127.9 kg)    Body mass index is 44.15 kg/m.   Constitutional: NAD, calm, comfortable Eyes: PERRL, lids and conjunctivae normal ENMT: Mucous membranes are moist. Posterior pharynx clear of any exudate or lesions. Normal dentition. Tympanic membrane is pearly white, no erythema or bulging. Neck: normal, supple, no masses, no thyromegaly Respiratory: clear to auscultation bilaterally, no wheezing, no crackles. Normal respiratory effort. No accessory muscle use.  Cardiovascular: Regular rate and rhythm,  no murmurs / rubs / gallops. No extremity edema. 2+ pedal pulses. No carotid bruits.  Abdomen: no tenderness, no masses palpated. No hepatosplenomegaly. Bowel sounds positive.  Musculoskeletal: no clubbing / cyanosis. No joint deformity upper and lower extremities. Good ROM, no contractures. Normal muscle tone.  Skin: no rashes, lesions, ulcers. No induration Neurologic: CN 2-12 grossly intact. Sensation intact, DTR normal. Strength 5/5 in all 4.  Psychiatric: Normal judgment and insight. Alert and oriented x 3. Normal mood.    Impression and Plan:  Essential hypertension -Well-controlled on current medications.  Gastroesophageal reflux disease without esophagitis -Currently without symptoms, takes omeprazole daily.  History of colonic polyps -Follows with Dr. Fuller Plan for screening colonoscopies; she is on a q. five-year schedule.  Personal history of DVT (deep vein thrombosis) -Remote, currently anticoagulated on Eliquis due to atrial fibrillation.  Paroxysmal atrial fibrillation (HCC) -Rate controlled on metoprolol, anticoagulated on Eliquis.  Malignant melanoma of lower extremity, including hip, unspecified laterality ([place]), Chronic -Follows with dermatology yearly.  Anxiety  -Patient has been taking high doses of alprazolam of 1 mg 4 times a day.  This has been prescribed by her previous PCP. -Have told patient that I do not feel comfortable prescribing this high of a dose.  I am willing to taper her down to twice a day.  She agrees. -I have instructed her to take the alprazolam 4 times a day for 2 weeks, taper down to 3 times a day for 2 weeks and then twice daily thereafter. -Low threshold for referral to psychiatry.  Morbid obesity ([place]) -Have encouraged healthy lifestyle stop    Patient Instructions  -It was nice meeting you!  -Please follow-up with me in 6 months for chronic medical issues.  May come sooner if needed.     Lelon Frohlich, MD [place]  Jacklynn Ganong

## 2018-08-13 NOTE — Patient Instructions (Signed)
-  It was nice meeting you!  -Please follow-up with me in 6 months for chronic medical issues.  May come sooner if needed.

## 2018-08-14 ENCOUNTER — Telehealth: Payer: Self-pay | Admitting: *Deleted

## 2018-08-14 NOTE — Telephone Encounter (Signed)
Patient is scheduled for lab study on 10/07/2018. Patient understands her sleep study will be done at [facility] sleep lab. Patient understands she will receive a sleep packet in a week or so. Patient understands to call if she does not receive the sleep packet in a timely manner. Patient agrees with treatment and thanked me for call.

## 2018-08-14 NOTE — Telephone Encounter (Signed)
-----   Message from Lauralee Evener, [place] sent at 08/08/2018 10:19 AM EST ----- No PA is required. Ok to schedule sleep study. ----- Message ----- From: Emmaline Life, RN Sent: 08/07/2018  11:58 AM EST To: [facility] Sleep Studies  Dr. Acie Fredrickson is referring patient for sleep study for sleep apnea, obesity Epworth score is 13  Thank you, Sharyn Lull

## 2018-08-17 ENCOUNTER — Other Ambulatory Visit: Payer: Self-pay | Admitting: Internal Medicine

## 2018-08-20 DIAGNOSIS — M25512 Pain in left shoulder: Secondary | ICD-10-CM | POA: Diagnosis not present

## 2018-08-20 DIAGNOSIS — M150 Primary generalized (osteo)arthritis: Secondary | ICD-10-CM | POA: Diagnosis not present

## 2018-08-20 DIAGNOSIS — M2550 Pain in unspecified joint: Secondary | ICD-10-CM | POA: Diagnosis not present

## 2018-08-20 DIAGNOSIS — M545 Low back pain: Secondary | ICD-10-CM | POA: Diagnosis not present

## 2018-08-20 DIAGNOSIS — M7550 Bursitis of unspecified shoulder: Secondary | ICD-10-CM | POA: Diagnosis not present

## 2018-08-20 DIAGNOSIS — M542 Cervicalgia: Secondary | ICD-10-CM | POA: Diagnosis not present

## 2018-08-20 DIAGNOSIS — Z6841 Body Mass Index (BMI) 40.0 and over, adult: Secondary | ICD-10-CM | POA: Diagnosis not present

## 2018-08-20 DIAGNOSIS — M797 Fibromyalgia: Secondary | ICD-10-CM | POA: Diagnosis not present

## 2018-08-26 ENCOUNTER — Other Ambulatory Visit: Payer: Self-pay | Admitting: Internal Medicine

## 2018-08-26 DIAGNOSIS — Z1231 Encounter for screening mammogram for malignant neoplasm of breast: Secondary | ICD-10-CM

## 2018-09-02 ENCOUNTER — Other Ambulatory Visit: Payer: Self-pay | Admitting: Family Medicine

## 2018-09-02 DIAGNOSIS — F419 Anxiety disorder, unspecified: Secondary | ICD-10-CM

## 2018-09-09 DIAGNOSIS — Z23 Encounter for immunization: Secondary | ICD-10-CM | POA: Diagnosis not present

## 2018-09-09 DIAGNOSIS — L905 Scar conditions and fibrosis of skin: Secondary | ICD-10-CM | POA: Diagnosis not present

## 2018-09-09 DIAGNOSIS — D485 Neoplasm of uncertain behavior of skin: Secondary | ICD-10-CM | POA: Diagnosis not present

## 2018-09-09 DIAGNOSIS — L821 Other seborrheic keratosis: Secondary | ICD-10-CM | POA: Diagnosis not present

## 2018-10-07 ENCOUNTER — Ambulatory Visit (HOSPITAL_BASED_OUTPATIENT_CLINIC_OR_DEPARTMENT_OTHER): Payer: Medicare Other | Attending: Cardiovascular Disease | Admitting: Cardiology

## 2018-10-07 DIAGNOSIS — Z79899 Other long term (current) drug therapy: Secondary | ICD-10-CM | POA: Diagnosis not present

## 2018-10-07 DIAGNOSIS — R5383 Other fatigue: Secondary | ICD-10-CM | POA: Insufficient documentation

## 2018-10-07 DIAGNOSIS — G4733 Obstructive sleep apnea (adult) (pediatric): Secondary | ICD-10-CM

## 2018-10-07 DIAGNOSIS — R0902 Hypoxemia: Secondary | ICD-10-CM | POA: Insufficient documentation

## 2018-10-07 DIAGNOSIS — Z7901 Long term (current) use of anticoagulants: Secondary | ICD-10-CM | POA: Insufficient documentation

## 2018-10-07 DIAGNOSIS — I493 Ventricular premature depolarization: Secondary | ICD-10-CM | POA: Diagnosis not present

## 2018-10-07 DIAGNOSIS — I10 Essential (primary) hypertension: Secondary | ICD-10-CM | POA: Diagnosis not present

## 2018-10-07 DIAGNOSIS — G4730 Sleep apnea, unspecified: Secondary | ICD-10-CM | POA: Diagnosis not present

## 2018-10-07 DIAGNOSIS — I272 Pulmonary hypertension, unspecified: Secondary | ICD-10-CM | POA: Diagnosis not present

## 2018-10-08 NOTE — Procedures (Signed)
      Patient Name: Kathy [name], [place] Date:10/07/2018 Gender: Female D.O.B: August 17, 1951 Age (years): 67 Referring Provider: Mertie Moores Height (inches): 68 Interpreting Physician: Fransico Him MD, ABSM Weight (lbs): 270 RPSGT: Carolin Coy BMI: 41 MRN: [id number] Neck Size: 15.50  CLINICAL INFORMATION Sleep Study Type: NPSG  Indication for sleep study: Fatigue, Hypertension, Obesity, Snoring  Epworth Sleepiness Score: 3  SLEEP STUDY TECHNIQUE As per the AASM Manual for the Scoring of Sleep and Associated Events v2.3 (April 2016) with a hypopnea requiring 4% desaturations.  The channels recorded and monitored were frontal, central and occipital EEG, electrooculogram (EOG), submentalis EMG (chin), nasal and oral airflow, thoracic and abdominal wall motion, anterior tibialis EMG, snore microphone, electrocardiogram, and pulse oximetry.  MEDICATIONS Medications self-administered by patient taken the night of the study : Viona Gilmore, [place] The study was initiated at 11:02:14 PM and ended at 5:05:32 AM.  Sleep onset time was 32.9 minutes and the sleep efficiency was 63.8%. The total sleep time was 231.9 minutes.  Stage REM latency was N/A minutes.  The patient spent 40.11% of the night in stage N1 sleep, 59.89% in stage N2 sleep, 0.00% in stage N3 and 0% in REM.  Alpha intrusion was absent.  Supine sleep was 13.15%.  RESPIRATORY PARAMETERS The overall apnea/hypopnea index (AHI) was 20.2 per hour. There were 15 total apneas, including 15 obstructive, 0 central and 0 mixed apneas. There were 63 hypopneas and 55 RERAs.  The AHI during Stage REM sleep was N/A per hour.  AHI while supine was 55.1 per hour.  The mean oxygen saturation was 93.14%. The minimum SpO2 during sleep was 79.00%.  moderate snoring was noted during this study.  CARDIAC DATA The 2 lead EKG demonstrated sinus rhythm. The mean heart rate was 68.00 beats per minute.  Other EKG findings include: PVCs and PACs  LEG MOVEMENT DATA The total PLMS were 55 with a resulting PLMS index of 14.23. Associated arousal with leg movement index was 3.6 .  IMPRESSIONS - Moderate obstructive sleep apnea occurred during this study (AHI = 20.2/h). - No significant central sleep apnea occurred during this study (CAI = 0.0/h). - Moderate oxygen desaturation was noted during this study (Min O2 = 79.00%). - The patient snored with moderate snoring volume. - EKG findings include PVCs and PACs - Mild periodic limb movements of sleep occurred during the study. No significant associated arousals.  DIAGNOSIS - Obstructive Sleep Apnea (327.23 [G47.33 ICD-10]) - Nocturnal Hypoxemia (327.26 [G47.36 ICD-10])  RECOMMENDATIONS - Therapeutic CPAP titration to determine optimal pressure required to alleviate sleep disordered breathing. - Positional therapy avoiding supine position during sleep. - Avoid alcohol, sedatives and other CNS depressants that may worsen sleep apnea and disrupt normal sleep architecture. - Sleep hygiene should be reviewed to assess factors that may improve sleep quality. - Weight management and regular exercise should be initiated or continued if appropriate.  [Electronically signed] 10/08/2018 08:00 PM  Fransico Him MD, ABSM Diplomate, American Board of Sleep Medicine

## 2018-10-10 ENCOUNTER — Ambulatory Visit
Admission: RE | Admit: 2018-10-10 | Discharge: 2018-10-10 | Disposition: A | Discharge status: Home / Self Care | Payer: No Typology Code available for payment source | Source: Ambulatory Visit | Attending: Internal Medicine | Admitting: Internal Medicine

## 2018-10-10 DIAGNOSIS — Z1231 Encounter for screening mammogram for malignant neoplasm of breast: Secondary | ICD-10-CM

## 2018-10-11 ENCOUNTER — Telehealth: Payer: Self-pay | Admitting: *Deleted

## 2018-10-11 DIAGNOSIS — G4733 Obstructive sleep apnea (adult) (pediatric): Secondary | ICD-10-CM

## 2018-10-11 NOTE — Telephone Encounter (Signed)
-----   Message from Sueanne Margarita, MD sent at 10/08/2018  8:02 PM EST ----- Please let patient know that they have sleep apnea and recommend CPAP titration. Please set up titration in the sleep lab.

## 2018-10-11 NOTE — Telephone Encounter (Signed)
Informed patient of sleep study results and patient understanding was verbalized. Patient understands her sleep study showed   they have sleep apnea and recommend CPAP titration. Please set up titration in the sleep lab.  Pt is aware and agreeable to results.

## 2018-10-11 NOTE — Addendum Note (Signed)
Addended by: Freada Bergeron on: 10/11/2018 02:33 PM   Modules accepted: Orders

## 2018-10-11 NOTE — Telephone Encounter (Signed)
-----   Message from Freada Bergeron, [place] sent at 10/11/2018  3:56 PM EST ----- Regarding: pre cert  recommend CPAP titration  Patient has new insurance supplement [facility] Medicare  Group # [id number]  Member ID# [id number]  Customer Service# [phone] Thanks

## 2018-10-11 NOTE — Telephone Encounter (Signed)
Called results lmtcb. 

## 2018-10-11 NOTE — Telephone Encounter (Signed)
Staff message sent to [facility] per [facility] no PA is required. Ok to schedule CPAP titration study. Decision ID #: [id number].

## 2018-10-16 NOTE — Telephone Encounter (Signed)
  Kathy [name], CMA  Kathy [name], [name]        Per [facility] no PA is required. Ok to schedule CPAP titration. Decision ID #: [id number]     ----- Message -----  From: Kathy [name], CMA  Sent: 10/11/2018  3:56 PM EST  To: [facility] Sleep [facility]  Subject: pre cert                     recommend CPAP titration   Patient has new insurance supplement [facility] [employer]   Group # [id number]   Member ID# [id number]   Customer Service# [phone]  Thanks

## 2018-10-18 ENCOUNTER — Encounter: Payer: Self-pay | Admitting: *Deleted

## 2018-10-18 NOTE — Telephone Encounter (Addendum)
Patient is scheduled for CPAP Titration on 11/24/2018. Patient understands his titration study will be done at [facility] sleep lab. Patient understands he will receive a letter in a week or so detailing appointment, date, time, and location. Patient understands to call if he does not receive the letter  in a timely manner. Patient agrees with treatment and thanked me for call.

## 2018-10-23 ENCOUNTER — Ambulatory Visit: Payer: Medicare Other | Admitting: Internal Medicine

## 2018-10-23 ENCOUNTER — Encounter: Payer: Self-pay | Admitting: Internal Medicine

## 2018-10-23 DIAGNOSIS — M545 Low back pain: Secondary | ICD-10-CM | POA: Diagnosis not present

## 2018-10-23 DIAGNOSIS — G8929 Other chronic pain: Secondary | ICD-10-CM | POA: Diagnosis not present

## 2018-10-23 NOTE — Patient Instructions (Addendum)
Good to see you again today.  Instructions: -use ice in area as needed -massage therapy as needed -walk daily as tolerated -try lifestyle changes to lose weight -back exercises at least twice a day -let us know if you would like to see PT again    Chronic Back Pain When back pain lasts longer than 3 months, it is called chronic back pain.The cause of your back pain may not be known. Some common causes include:  Wear and tear (degenerative disease) of the bones, ligaments, or disks in your back.  Inflammation and stiffness in your back (arthritis). People who have chronic back pain often go through certain periods in which the pain is more intense (flare-ups). Many people can learn to manage the pain with home care. Follow these instructions at home: Pay attention to any changes in your symptoms. Take these actions to help with your pain: Activity   Avoid bending and other activities that make the problem worse.  Maintain a proper position when standing or sitting: ? When standing, keep your upper back and neck straight, with your shoulders pulled back. Avoid slouching. ? When sitting, keep your back straight and relax your shoulders. Do not round your shoulders or pull them backward.  Do not sit or stand in one place for long periods of time.  Take brief periods of rest throughout the day. This will reduce your pain. Resting in a lying or standing position is usually better than sitting to rest.  When you are resting for longer periods, mix in some mild activity or stretching between periods of rest. This will help to prevent stiffness and pain.  Get regular exercise. Ask your health care provider what activities are safe for you.  Do not lift anything that is heavier than 10 lb (4.5 kg). Always use proper lifting technique, which includes: ? Bending your knees. ? Keeping the load close to your body. ? Avoiding twisting.  Sleep on a firm mattress in a comfortable position.  Try lying on your side with your knees slightly bent. If you lie on your back, put a pillow under your knees. Managing pain  If directed, apply ice to the painful area. Your health care provider may recommend applying ice during the first 24-48 hours after a flare-up begins. ? Put ice in a plastic bag. ? Place a towel between your skin and the bag. ? Leave the ice on for 20 minutes, 2-3 times per day.  If directed, apply heat to the affected area as often as told by your health care provider. Use the heat source that your health care provider recommends, such as a moist heat pack or a heating pad. ? Place a towel between your skin and the heat source. ? Leave the heat on for 20-30 minutes. ? Remove the heat if your skin turns bright red. This is especially important if you are unable to feel pain, heat, or cold. You may have a greater risk of getting burned.  Try soaking in a warm tub.  Take over-the-counter and prescription medicines only as told by your health care provider.  Keep all follow-up visits as told by your health care provider. This is important. Contact a health care provider if:  You have pain that is not relieved with rest or medicine. Get help right away if:  You have weakness or numbness in one or both of your legs or feet.  You have trouble controlling your bladder or your bowels.  You have nausea or  vomiting.  You have pain in your abdomen.  You have shortness of breath or you faint. This information is not intended to replace advice given to you by your health care provider. Make sure you discuss any questions you have with your health care provider. Document Released: 10/12/2004 Document Revised: 04/11/2018 Document Reviewed: 03/14/2017 Elsevier Interactive Patient Education  2019 [employer] Calories are units of energy. Your body needs a certain amount of calories from food to keep you going throughout the day. When  you eat more calories than your body needs, your body stores the extra calories as fat. When you eat fewer calories than your body needs, your body burns fat to get the energy it needs. Calorie counting means keeping track of how many calories you eat and drink each day. Calorie counting can be helpful if you need to lose weight. If you make sure to eat fewer calories than your body needs, you should lose weight. Ask your health care provider what a healthy weight is for you. For calorie counting to work, you will need to eat the right number of calories in a day in order to lose a healthy amount of weight per week. A dietitian can help you determine how many calories you need in a day and will give you suggestions on how to reach your calorie goal.  A healthy amount of weight to lose per week is usually 1-2 lb (0.5-0.9 kg). This usually means that your daily calorie intake should be reduced by 500-750 calories.  Eating 1,200 - 1,500 calories per day can help most women lose weight.  Eating 1,500 - 1,800 calories per day can help most men lose weight. What is my plan? My goal is to have __________ calories per day. If I have this many calories per day, I should lose around __________ pounds per week. What do I need to know about calorie counting? In order to meet your daily calorie goal, you will need to:  Find out how many calories are in each food you would like to eat. Try to do this before you eat.  Decide how much of the food you plan to eat.  Write down what you ate and how many calories it had. Doing this is called keeping a food log. To successfully lose weight, it is important to balance calorie counting with a healthy lifestyle that includes regular activity. Aim for 150 minutes of moderate exercise (such as walking) or 75 minutes of vigorous exercise (such as running) each week. Where do I find calorie information?  The number of calories in a food can be found on a Nutrition Facts  label. If a food does not have a Nutrition Facts label, try to look up the calories online or ask your dietitian for help. Remember that calories are listed per serving. If you choose to have more than one serving of a food, you will have to multiply the calories per serving by the amount of servings you plan to eat. For example, the label on a package of bread might say that a serving size is 1 slice and that there are 90 calories in a serving. If you eat 1 slice, you will have eaten 90 calories. If you eat 2 slices, you will have eaten 180 calories. How do I keep a food log? Immediately after each meal, record the following information in your food log:  What you ate. Don't forget to include toppings, sauces,  and other extras on the food.  How much you ate. This can be measured in cups, ounces, or number of items.  How many calories each food and drink had.  The total number of calories in the meal. Keep your food log near you, such as in a small notebook in your pocket, or use a mobile app or website. Some programs will calculate calories for you and show you how many calories you have left for the day to meet your goal. What are some calorie counting tips?   Use your calories on foods and drinks that will fill you up and not leave you hungry: ? Some examples of foods that fill you up are nuts and nut butters, vegetables, lean proteins, and high-fiber foods like whole grains. High-fiber foods are foods with more than 5 g fiber per serving. ? Drinks such as sodas, specialty coffee drinks, alcohol, and juices have a lot of calories, yet do not fill you up.  Eat nutritious foods and avoid empty calories. Empty calories are calories you get from foods or beverages that do not have many vitamins or protein, such as candy, sweets, and soda. It is better to have a nutritious high-calorie food (such as an avocado) than a food with few nutrients (such as a bag of chips).  Know how many calories are  in the foods you eat most often. This will help you calculate calorie counts faster.  Pay attention to calories in drinks. Low-calorie drinks include water and unsweetened drinks.  Pay attention to nutrition labels for "low fat" or "fat free" foods. These foods sometimes have the same amount of calories or more calories than the full fat versions. They also often have added sugar, starch, or salt, to make up for flavor that was removed with the fat.  Find a way of tracking calories that works for you. Get creative. Try different apps or programs if writing down calories does not work for you. What are some portion control tips?  Know how many calories are in a serving. This will help you know how many servings of a certain food you can have.  Use a measuring cup to measure serving sizes. You could also try weighing out portions on a kitchen scale. With time, you will be able to estimate serving sizes for some foods.  Take some time to put servings of different foods on your favorite plates, bowls, and cups so you know what a serving looks like.  Try not to eat straight from a bag or box. Doing this can lead to overeating. Put the amount you would like to eat in a cup or on a plate to make sure you are eating the right portion.  Use smaller plates, glasses, and bowls to prevent overeating.  Try not to multitask (for example, watch TV or use your computer) while eating. If it is time to eat, sit down at a table and enjoy your food. This will help you to know when you are full. It will also help you to be aware of what you are eating and how much you are eating. What are tips for following this plan? Reading food labels  Check the calorie count compared to the serving size. The serving size may be smaller than what you are used to eating.  Check the source of the calories. Make sure the food you are eating is high in vitamins and protein and low in saturated and trans fats. Shopping  Read  nutrition  labels while you shop. This will help you make healthy decisions before you decide to purchase your food.  Make a grocery list and stick to it. Cooking  Try to cook your favorite foods in a healthier way. For example, try baking instead of frying.  Use low-fat dairy products. Meal planning  Use more fruits and vegetables. Half of your plate should be fruits and vegetables.  Include lean proteins like poultry and fish. How do I count calories when eating out?  Ask for smaller portion sizes.  Consider sharing an entree and sides instead of getting your own entree.  If you get your own entree, eat only half. Ask for a box at the beginning of your meal and put the rest of your entree in it so you are not tempted to eat it.  If calories are listed on the menu, choose the lower calorie options.  Choose dishes that include vegetables, fruits, whole grains, low-fat dairy products, and lean protein.  Choose items that are boiled, broiled, grilled, or steamed. Stay away from items that are buttered, battered, fried, or served with cream sauce. Items labeled "crispy" are usually fried, unless stated otherwise.  Choose water, low-fat milk, unsweetened iced tea, or other drinks without added sugar. If you want an alcoholic beverage, choose a lower calorie option such as a glass of wine or light beer.  Ask for dressings, sauces, and syrups on the side. These are usually high in calories, so you should limit the amount you eat.  If you want a salad, choose a garden salad and ask for grilled meats. Avoid extra toppings like bacon, cheese, or fried items. Ask for the dressing on the side, or ask for olive oil and vinegar or lemon to use as dressing.  Estimate how many servings of a food you are given. For example, a serving of cooked rice is  cup or about the size of half a baseball. Knowing serving sizes will help you be aware of how much food you are eating at restaurants. The list below  tells you how big or small some common portion sizes are based on everyday objects: ? 1 oz-4 stacked dice. ? 3 oz-1 deck of cards. ? 1 tsp-1 die. ? 1 Tbsp- a ping-pong ball. ? 2 Tbsp-1 ping-pong ball. ?  cup- baseball. ? 1 cup-1 baseball. Summary  Calorie counting means keeping track of how many calories you eat and drink each day. If you eat fewer calories than your body needs, you should lose weight.  A healthy amount of weight to lose per week is usually 1-2 lb (0.5-0.9 kg). This usually means reducing your daily calorie intake by 500-750 calories.  The number of calories in a food can be found on a Nutrition Facts label. If a food does not have a Nutrition Facts label, try to look up the calories online or ask your dietitian for help.  Use your calories on foods and drinks that will fill you up, and not on foods and drinks that will leave you hungry.  Use smaller plates, glasses, and bowls to prevent overeating. This information is not intended to replace advice given to you by your health care provider. Make sure you discuss any questions you have with your health care provider. Document Released: 09/04/2005 Document Revised: 05/24/2018 Document Reviewed: 08/04/2016 Elsevier Interactive Patient Education  2019 [employer].

## 2018-10-23 NOTE — Progress Notes (Signed)
Established Patient Office Visit     CC/Reason for Visit: right sided low back pain  HPI: Kathy [name] is a 68 y.o. female who is coming in today for the above mentioned reason. Past Medical History is significant for hypertension, obesity, arthritis, atrial fib., FM, anxiety, melanoma of the hip and low back pain.  Patient sts she has DDD and osteoporosis as well and sts she had mentioned this to Dr. Raliegh Ip before he retired.  She c/o right lower back pain.  She says the pain has increased this week and when she gets out of bed the pain takes her breath away.  She does have chronic pain and takes tylenol OTC with not much relief, and says it hurts the worse when she lays in the bed.  She denies an injury and sts she did some physical therapy sometime last year, and says it hurt worse afterwards.  She has had both hips replaced by Dr. Hector Shade at [facility] two years apart.     Past Medical/Surgical History: Past Medical History:  Diagnosis Date  . Adenomatous colon polyp    2012  . Anxiety   . Anxiety and depression   . Arthritis    Osteoarthritis, Dr Maureen Ralphs  . Depression   . Diverticulosis   . Fibromyalgia    Dr Tobie Lords, Rheu  . GERD (gastroesophageal reflux disease)   . H/O hiatal hernia   . Headache(784.0)    PAST HX MILD MIGRAINES  . Heart murmur    mitral valve prolapse--no symptoms  . History of colonic polyps 08/08/2011       . HTN (hypertension)   . IBS (irritable bowel syndrome)   . Internal hemorrhoid   . Lumbar radiculopathy   . Obesity   . Syncope    idiopathic, ? related to migraines or hyperventilation. Dr Leonie Man, Neurology,  ONE TIME EPISODE AND THIS WAS YEARS AGO    Past Surgical History:  Procedure Laterality Date  . ABDOMINAL HYSTERECTOMY     dysfunctional menses  . APPENDECTOMY    . BREAST EXCISIONAL BIOPSY Right   . CARDIAC CATHETERIZATION  06/26/2018  . CHOLECYSTECTOMY    . COLONOSCOPY  05/27/2002, 08/08/11   2003  diverticulosis, hemorrhoids 2012 same + cecal polyp  . KNEE ARTHROSCOPY  2008   right  . LEFT HEART CATH AND CORONARY ANGIOGRAPHY N/A 06/26/2018   Procedure: LEFT HEART CATH AND CORONARY ANGIOGRAPHY;  Surgeon: Wellington Hampshire, MD;  Location: [facility];  Service: Cardiovascular;  Laterality: N/A;  . MELANOMA EXCISION Left 07/2013   foot  . TOTAL HIP ARTHROPLASTY  02/2010   left ; Dr Maureen Ralphs  . TOTAL HIP ARTHROPLASTY  03/18/2012   Procedure: TOTAL HIP ARTHROPLASTY;  Surgeon: Gearlean Alf, MD;  Location: [facility];  Service: Orthopedics;  Laterality: Right;  . UPPER GASTROINTESTINAL ENDOSCOPY  04/21/2009, 08/08/11   2010 gastritis ;2012 small hiatal hernia. Dr Carlean Purl    Social History:  reports that she has never smoked. She has never used smokeless tobacco. She reports current alcohol use. She reports that she does not use drugs.  Allergies: Allergies  Allergen Reactions  . Penicillins     ? Facial angioedema   . Sulfamethoxazole-Trimethoprim     hives  . Achromycin [Tetracycline Hcl]     Dyspnea    . Nickel     Two hip replacement  . Zanaflex [Tizanidine Hydrochloride]     Causes more pain  Family History:  Family History  Problem Relation Age of Onset  . Heart attack Mother 62  . COPD Mother   . Breast cancer Mother 2  . Hypertension Father   . Diabetes Father   . Heart failure Father 20  . Breast cancer Paternal Aunt        cns mets  . COPD Maternal Grandmother   . Heart disease Paternal Grandmother        died 79  . Diabetes Paternal Grandmother   . Stroke Paternal Grandfather        in late 63s  . Heart attack Brother 13  . Colon cancer Neg Hx      Current Outpatient Medications:  .  albuterol (PROVENTIL HFA;VENTOLIN HFA) 108 (90 Base) MCG/ACT inhaler, Inhale 2 puffs into the lungs every 6 (six) hours as needed for wheezing or shortness of breath., Disp: 1 Inhaler, Rfl: 2 .  ALPRAZolam (XANAX) 1 MG tablet, Take 1 mg 4 times daily for 2 weeks,  then 1 mg 3 times daily for 2 weeks and then keep at 1 mg twice daily thereafter., Disp: 100 tablet, Rfl: 1 .  apixaban (ELIQUIS) 5 MG TABS tablet, Take 1 tablet (5 mg total) by mouth 2 (two) times daily., Disp: 180 tablet, Rfl: 3 .  atorvastatin (LIPITOR) 40 MG tablet, Take 1 tablet (40 mg total) by mouth daily at 6 PM., Disp: 90 tablet, Rfl: 3 .  CALCIUM PO, Take 1,000 mg by mouth daily. , Disp: , Rfl:  .  clobetasol cream (TEMOVATE) 3.14 %, Apply 1 application topically as needed (Psoriasis). , Disp: , Rfl:  .  cyclobenzaprine (FLEXERIL) 10 MG tablet, Take 1 tablet (10 mg total) by mouth 4 (four) times daily as needed. SPASMS (Patient taking differently: Take 10 mg by mouth 4 (four) times daily as needed for muscle spasms. ), Disp: 30 tablet, Rfl: 5 .  famotidine-calcium carbonate-magnesium hydroxide (PEPCID COMPLETE) 10-800-165 MG CHEW chewable tablet, 1 -2 tablets as needed to prevent or treat heartburn (Patient taking differently: Chew 1-2 tablets by mouth daily as needed (heartburn). ), Disp: , Rfl:  .  FLUoxetine (PROZAC) 40 MG capsule, Take 1 capsule (40 mg total) by mouth daily., Disp: 90 capsule, Rfl: 0 .  fluticasone (FLONASE) 50 MCG/ACT nasal spray, Place 2 sprays into both nostrils daily., Disp: 16 g, Rfl: 6 .  furosemide (LASIX) 20 MG tablet, Take 1 tablet (20 mg total) by mouth daily., Disp: 90 tablet, Rfl: 3 .  HYDROcodone-acetaminophen (NORCO/VICODIN) 5-325 MG tablet, Take 1 tablet by mouth every 6 (six) hours as needed. Leafy Kindle, Disp: , Rfl:  .  metoprolol succinate (TOPROL-XL) 100 MG 24 hr tablet, Take 1 tablet (100 mg total) by mouth daily., Disp: 90 tablet, Rfl: 0 .  Multiple Vitamin (MULTIVITAMIN) tablet, Take 1 tablet by mouth daily., Disp: , Rfl:  .  olmesartan (BENICAR) 40 MG tablet, Take 1 tablet (40 mg total) by mouth daily., Disp: 90 tablet, Rfl: 0 .  omeprazole (PRILOSEC) 40 MG capsule, TAKE 1 CAPSULE BY MOUTH EVERY DAY (Patient taking differently: Take 40 mg by  mouth daily. ), Disp: 90 capsule, Rfl: 3  Review of Systems:  Constitutional: Denies fever, chills, diaphoresis, appetite change and fatigue.  HEENT: Denies photophobia, eye pain, redness, hearing loss, ear pain, congestion, sore throat, rhinorrhea, sneezing, mouth sores, trouble swallowing, neck pain, neck stiffness and tinnitus.   Respiratory: Denies SOB, DOE, cough, chest tightness,  and wheezing.   Cardiovascular: Denies chest pain, palpitations and  leg swelling.  Gastrointestinal: Denies nausea, vomiting, abdominal pain, diarrhea, constipation, blood in stool and abdominal distention.  Genitourinary: Denies dysuria, urgency, frequency, hematuria, flank pain and difficulty urinating.  Endocrine: Denies: hot or cold intolerance, sweats, changes in hair or nails, polyuria, polydipsia. Musculoskeletal: Denies joint swelling, arthralgias and gait problem. C/o right lower back pain for about 6 months Skin: Denies pallor, rash and wound.  Neurological: Denies dizziness, seizures, syncope, weakness, light-headedness, numbness and headaches.  Hematological: Denies adenopathy. Easy bruising, personal or family bleeding history  Psychiatric/Behavioral: Denies suicidal ideation, mood changes, confusion, nervousness, sleep disturbance and agitation   Physical Exam: Vitals:   10/23/18 1108  BP: 120/78  Pulse: 71  Temp: 97.7 F (36.5 C)  TempSrc: Oral  SpO2: 95%  Weight: 284 lb 12.8 oz (129.2 kg)    Body mass index is 43.3 kg/m.   Constitutional: NAD, calm Respiratory: clear to auscultation bilaterally, no wheezing, no crackles. Normal respiratory effort. No accessory muscle use.  Cardiovascular: Regular rate and rhythm, no murmurs / rubs / gallops. No extremity edema. 2+ pedal pulses. No carotid bruits.  Musculoskeletal: no clubbing / cyanosis. No joint deformity. Good ROM, no contractures. Muscle tenderness to the touch.  Normal gait negative SLR Skin: no rashes, lesions, ulcers. No  induration Psychiatric: Normal judgment and insight. Alert and oriented x 3. Normal mood.    Impression and Plan:  Morbid obesity ([place]) -work on weight loss and count your calories -walk daily  Chronic right-sided low back pain without sciatica -use ice pack as needed -massage therapy as needed -take tylenol OTC for pain -back exercises provided. -can consider referral to PT ir no improvement -advised that physical activity helps and to avoid sedentarism      Patient Instructions  Good to see you again today.  Instructions: -use ice in area as needed -massage therapy as needed -walk daily as tolerated -try lifestyle changes to lose weight -back exercises at least twice a day -let us know if you would like to see PT again    Chronic Back Pain When back pain lasts longer than 3 months, it is called chronic back pain.The cause of your back pain may not be known. Some common causes include:  Wear and tear (degenerative disease) of the bones, ligaments, or disks in your back.  Inflammation and stiffness in your back (arthritis). People who have chronic back pain often go through certain periods in which the pain is more intense (flare-ups). Many people can learn to manage the pain with home care. Follow these instructions at home: Pay attention to any changes in your symptoms. Take these actions to help with your pain: Activity   Avoid bending and other activities that make the problem worse.  Maintain a proper position when standing or sitting: ? When standing, keep your upper back and neck straight, with your shoulders pulled back. Avoid slouching. ? When sitting, keep your back straight and relax your shoulders. Do not round your shoulders or pull them backward.  Do not sit or stand in one place for long periods of time.  Take brief periods of rest throughout the day. This will reduce your pain. Resting in a lying or standing position is usually better than  sitting to rest.  When you are resting for longer periods, mix in some mild activity or stretching between periods of rest. This will help to prevent stiffness and pain.  Get regular exercise. Ask your health care provider what activities are safe for you.  Do  not lift anything that is heavier than 10 lb (4.5 kg). Always use proper lifting technique, which includes: ? Bending your knees. ? Keeping the load close to your body. ? Avoiding twisting.  Sleep on a firm mattress in a comfortable position. Try lying on your side with your knees slightly bent. If you lie on your back, put a pillow under your knees. Managing pain  If directed, apply ice to the painful area. Your health care provider may recommend applying ice during the first 24-48 hours after a flare-up begins. ? Put ice in a plastic bag. ? Place a towel between your skin and the bag. ? Leave the ice on for 20 minutes, 2-3 times per day.  If directed, apply heat to the affected area as often as told by your health care provider. Use the heat source that your health care provider recommends, such as a moist heat pack or a heating pad. ? Place a towel between your skin and the heat source. ? Leave the heat on for 20-30 minutes. ? Remove the heat if your skin turns bright red. This is especially important if you are unable to feel pain, heat, or cold. You may have a greater risk of getting burned.  Try soaking in a warm tub.  Take over-the-counter and prescription medicines only as told by your health care provider.  Keep all follow-up visits as told by your health care provider. This is important. Contact a health care provider if:  You have pain that is not relieved with rest or medicine. Get help right away if:  You have weakness or numbness in one or both of your legs or feet.  You have trouble controlling your bladder or your bowels.  You have nausea or vomiting.  You have pain in your abdomen.  You have shortness  of breath or you faint. This information is not intended to replace advice given to you by your health care provider. Make sure you discuss any questions you have with your health care provider. Document Released: 10/12/2004 Document Revised: 04/11/2018 Document Reviewed: 03/14/2017 Elsevier Interactive Patient Education  2019 [place] for [employer] Calories are units of energy. Your body needs a certain amount of calories from food to keep you going throughout the day. When you eat more calories than your body needs, your body stores the extra calories as fat. When you eat fewer calories than your body needs, your body burns fat to get the energy it needs. Calorie counting means keeping track of how many calories you eat and drink each day. Calorie counting can be helpful if you need to lose weight. If you make sure to eat fewer calories than your body needs, you should lose weight. Ask your health care provider what a healthy weight is for you. For calorie counting to work, you will need to eat the right number of calories in a day in order to lose a healthy amount of weight per week. A dietitian can help you determine how many calories you need in a day and will give you suggestions on how to reach your calorie goal.  A healthy amount of weight to lose per week is usually 1-2 lb (0.5-0.9 kg). This usually means that your daily calorie intake should be reduced by 500-750 calories.  Eating 1,200 - 1,500 calories per day can help most women lose weight.  Eating 1,500 - 1,800 calories per day can help most men lose weight. What is my  plan? My goal is to have __________ calories per day. If I have this many calories per day, I should lose around __________ pounds per week. What do I need to know about calorie counting? In order to meet your daily calorie goal, you will need to:  Find out how many calories are in each food you would like to eat. Try to do this before you  eat.  Decide how much of the food you plan to eat.  Write down what you ate and how many calories it had. Doing this is called keeping a food log. To successfully lose weight, it is important to balance calorie counting with a healthy lifestyle that includes regular activity. Aim for 150 minutes of moderate exercise (such as walking) or 75 minutes of vigorous exercise (such as running) each week. Where do I find calorie information?  The number of calories in a food can be found on a Nutrition Facts label. If a food does not have a Nutrition Facts label, try to look up the calories online or ask your dietitian for help. Remember that calories are listed per serving. If you choose to have more than one serving of a food, you will have to multiply the calories per serving by the amount of servings you plan to eat. For example, the label on a package of bread might say that a serving size is 1 slice and that there are 90 calories in a serving. If you eat 1 slice, you will have eaten 90 calories. If you eat 2 slices, you will have eaten 180 calories. How do I keep a food log? Immediately after each meal, record the following information in your food log:  What you ate. Don't forget to include toppings, sauces, and other extras on the food.  How much you ate. This can be measured in cups, ounces, or number of items.  How many calories each food and drink had.  The total number of calories in the meal. Keep your food log near you, such as in a small notebook in your pocket, or use a mobile app or website. Some programs will calculate calories for you and show you how many calories you have left for the day to meet your goal. What are some calorie counting tips?   Use your calories on foods and drinks that will fill you up and not leave you hungry: ? Some examples of foods that fill you up are nuts and nut butters, vegetables, lean proteins, and high-fiber foods like whole grains. High-fiber foods  are foods with more than 5 g fiber per serving. ? Drinks such as sodas, specialty coffee drinks, alcohol, and juices have a lot of calories, yet do not fill you up.  Eat nutritious foods and avoid empty calories. Empty calories are calories you get from foods or beverages that do not have many vitamins or protein, such as candy, sweets, and soda. It is better to have a nutritious high-calorie food (such as an avocado) than a food with few nutrients (such as a bag of chips).  Know how many calories are in the foods you eat most often. This will help you calculate calorie counts faster.  Pay attention to calories in drinks. Low-calorie drinks include water and unsweetened drinks.  Pay attention to nutrition labels for "low fat" or "fat free" foods. These foods sometimes have the same amount of calories or more calories than the full fat versions. They also often have added sugar, starch, or salt,  to make up for flavor that was removed with the fat.  Find a way of tracking calories that works for you. Get creative. Try different apps or programs if writing down calories does not work for you. What are some portion control tips?  Know how many calories are in a serving. This will help you know how many servings of a certain food you can have.  Use a measuring cup to measure serving sizes. You could also try weighing out portions on a kitchen scale. With time, you will be able to estimate serving sizes for some foods.  Take some time to put servings of different foods on your favorite plates, bowls, and cups so you know what a serving looks like.  Try not to eat straight from a bag or box. Doing this can lead to overeating. Put the amount you would like to eat in a cup or on a plate to make sure you are eating the right portion.  Use smaller plates, glasses, and bowls to prevent overeating.  Try not to multitask (for example, watch TV or use your computer) while eating. If it is time to eat, sit  down at a table and enjoy your food. This will help you to know when you are full. It will also help you to be aware of what you are eating and how much you are eating. What are tips for following this plan? Reading food labels  Check the calorie count compared to the serving size. The serving size may be smaller than what you are used to eating.  Check the source of the calories. Make sure the food you are eating is high in vitamins and protein and low in saturated and trans fats. Shopping  Read nutrition labels while you shop. This will help you make healthy decisions before you decide to purchase your food.  Make a grocery list and stick to it. Cooking  Try to cook your favorite foods in a healthier way. For example, try baking instead of frying.  Use low-fat dairy products. Meal planning  Use more fruits and vegetables. Half of your plate should be fruits and vegetables.  Include lean proteins like poultry and fish. How do I count calories when eating out?  Ask for smaller portion sizes.  Consider sharing an entree and sides instead of getting your own entree.  If you get your own entree, eat only half. Ask for a box at the beginning of your meal and put the rest of your entree in it so you are not tempted to eat it.  If calories are listed on the menu, choose the lower calorie options.  Choose dishes that include vegetables, fruits, whole grains, low-fat dairy products, and lean protein.  Choose items that are boiled, broiled, grilled, or steamed. Stay away from items that are buttered, battered, fried, or served with cream sauce. Items labeled "crispy" are usually fried, unless stated otherwise.  Choose water, low-fat milk, unsweetened iced tea, or other drinks without added sugar. If you want an alcoholic beverage, choose a lower calorie option such as a glass of wine or light beer.  Ask for dressings, sauces, and syrups on the side. These are usually high in calories, so  you should limit the amount you eat.  If you want a salad, choose a garden salad and ask for grilled meats. Avoid extra toppings like bacon, cheese, or fried items. Ask for the dressing on the side, or ask for olive oil and vinegar or  lemon to use as dressing.  Estimate how many servings of a food you are given. For example, a serving of cooked rice is  cup or about the size of half a baseball. Knowing serving sizes will help you be aware of how much food you are eating at restaurants. The list below tells you how big or small some common portion sizes are based on everyday objects: ? 1 oz-4 stacked dice. ? 3 oz-1 deck of cards. ? 1 tsp-1 die. ? 1 Tbsp- a ping-pong ball. ? 2 Tbsp-1 ping-pong ball. ?  cup- baseball. ? 1 cup-1 baseball. Summary  Calorie counting means keeping track of how many calories you eat and drink each day. If you eat fewer calories than your body needs, you should lose weight.  A healthy amount of weight to lose per week is usually 1-2 lb (0.5-0.9 kg). This usually means reducing your daily calorie intake by 500-750 calories.  The number of calories in a food can be found on a Nutrition Facts label. If a food does not have a Nutrition Facts label, try to look up the calories online or ask your dietitian for help.  Use your calories on foods and drinks that will fill you up, and not on foods and drinks that will leave you hungry.  Use smaller plates, glasses, and bowls to prevent overeating. This information is not intended to replace advice given to you by your health care provider. Make sure you discuss any questions you have with your health care provider. Document Released: 09/04/2005 Document Revised: 05/24/2018 Document Reviewed: 08/04/2016 Elsevier Interactive Patient Education  2019 [place], RN DNP student [facility] at [employer]

## 2018-11-05 ENCOUNTER — Ambulatory Visit (HOSPITAL_COMMUNITY): Payer: Medicare Other | Attending: Internal Medicine

## 2018-11-05 ENCOUNTER — Ambulatory Visit: Payer: Self-pay | Admitting: *Deleted

## 2018-11-05 DIAGNOSIS — I272 Pulmonary hypertension, unspecified: Secondary | ICD-10-CM | POA: Insufficient documentation

## 2018-11-05 DIAGNOSIS — I5032 Chronic diastolic (congestive) heart failure: Secondary | ICD-10-CM | POA: Insufficient documentation

## 2018-11-05 MED ORDER — PERFLUTREN LIPID MICROSPHERE
1.0000 mL | INTRAVENOUS | Status: AC | PRN
  Administered 2018-11-05: 2 mL via INTRAVENOUS

## 2018-11-05 NOTE — Telephone Encounter (Signed)
She called in saying she accidentally took 2 of her Eloquis 5 mg pills 5 minutes ago.  I called Sheena the flow coordinator at [facility] [employer]   Sheena asked that I send the triage notes to the office and they will check with Dr. Jerilee Hoh and call the pt back.  I sent my triage notes to the [place] office.  Reason for Disposition . [1] DOUBLE DOSE (an extra dose or lesser amount) of prescription drug AND [2] NO symptoms (Exception: a double dose of antibiotics)  Answer Assessment - Initial Assessment Questions 1. SYMPTOMS: "Do you have any symptoms?"     You took 2 Eloquis this morning.    You are supposed to take 1 in the morning and 1 in the evening.   Eloquis Am I going to bleed death.   I took it 5 minutes ago 2. SEVERITY: If symptoms are present, ask "Are they mild, moderate or severe?"     None   Just took it 5 minutes ago.  Protocols used: MEDICATION QUESTION CALL-A-AH

## 2018-11-05 NOTE — Telephone Encounter (Signed)
Discussed notes per Dr.Hernandez with patient, patient expressed understanding. Nothing further needed.

## 2018-11-05 NOTE — Telephone Encounter (Signed)
Noted. Ask her to skip evening dose today and resume twice daily tomorrow.

## 2018-11-05 NOTE — Telephone Encounter (Signed)
Please call pt on her cell phone with advice/recommendations.

## 2018-11-12 ENCOUNTER — Encounter: Payer: Self-pay | Admitting: Cardiovascular Disease

## 2018-11-12 ENCOUNTER — Other Ambulatory Visit: Payer: Self-pay | Admitting: *Deleted

## 2018-11-12 ENCOUNTER — Ambulatory Visit: Payer: Medicare Other | Admitting: Cardiovascular Disease

## 2018-11-12 VITALS — BP 160/112 | HR 142 | Ht 68.0 in | Wt 286.8 lb

## 2018-11-12 DIAGNOSIS — I5032 Chronic diastolic (congestive) heart failure: Secondary | ICD-10-CM | POA: Diagnosis not present

## 2018-11-12 DIAGNOSIS — I480 Paroxysmal atrial fibrillation: Secondary | ICD-10-CM

## 2018-11-12 DIAGNOSIS — I484 Atypical atrial flutter: Secondary | ICD-10-CM

## 2018-11-12 LAB — BASIC METABOLIC PANEL
BUN/Creatinine Ratio: 15 (ref 12–28)
BUN: 13 mg/dL (ref 8–27)
CO2: 27 mmol/L (ref 20–29)
Calcium: 9.3 mg/dL (ref 8.7–10.3)
Chloride: 96 mmol/L (ref 96–106)
Creatinine, Ser: 0.87 mg/dL (ref 0.57–1.00)
GFR calc Af Amer: 80 mL/min/{1.73_m2} (ref >59)
GFR calc non Af Amer: 70 mL/min/{1.73_m2} (ref >59)
Glucose: 116 mg/dL — ABNORMAL HIGH (ref 65–99)
Potassium: 3.6 mmol/L (ref 3.5–5.2)
Sodium: 143 mmol/L (ref 134–144)

## 2018-11-12 LAB — CBC
Hematocrit: 43.4 % (ref 34.0–46.6)
Hemoglobin: 14.2 g/dL (ref 11.1–15.9)
MCH: 30.5 pg (ref 26.6–33.0)
MCHC: 32.7 g/dL (ref 31.5–35.7)
MCV: 93 fL (ref 79–97)
Platelets: 218 10*3/uL (ref 150–450)
RBC: 4.65 x10E6/uL (ref 3.77–5.28)
RDW: 13 % (ref 11.7–15.4)
WBC: 6.4 10*3/uL (ref 3.4–10.8)

## 2018-11-12 MED ORDER — AMIODARONE HCL 200 MG PO TABS: 200.0000 mg | ORAL_TABLET | Freq: Two times a day (BID) | ORAL | 11 refills | Status: DC

## 2018-11-12 MED ORDER — METOPROLOL SUCCINATE ER 100 MG PO TB24: 100.0000 mg | ORAL_TABLET | Freq: Every day | ORAL | 1 refills | Status: DC

## 2018-11-12 NOTE — Patient Instructions (Signed)
Medication Instructions:  Your physician has recommended you make the following change in your medication:  START Amiodarone (Pacerone) 200 mg twice daily  If you need a refill on your cardiac medications before your next appointment, please call your pharmacy.    Lab work: TODAY - BMET, CBC  If you have labs (blood work) drawn today and your tests are completely normal, you will receive your results only by: Marland Kitchen MyChart Message (if you have MyChart) OR . A paper copy in the mail If you have any lab test that is abnormal or we need to change your treatment, we will call you to review the results.   Testing/Procedures: You are scheduled for a Cardioversion on Thursday Feb. 27 with Dr. Margaretann Loveless.  Please arrive at the [facility] (Main Entrance A) at [facility]: [address] [place], [place] [address] at 12 pm. (1 hour prior to procedure unless lab work is needed; if lab work is needed arrive 1.5 hours ahead)  DIET: Nothing to eat or drink after midnight except a sip of water with medications (see medication instructions below)  Medication Instructions: Hold Lasix (Furosemide) on the morning of the procedure  Continue your anticoagulant: Eliquis You will need to continue your anticoagulant after your  procedure until you  are told by your provider that it is safe to stop   You must have a responsible person to drive you home and stay in the waiting area during your procedure. Failure to do so could result in cancellation.  Bring your insurance cards.  *Special Note: Every effort is made to have your procedure done on time. Occasionally there are emergencies that occur at the hospital that may cause delays. Please be patient if a delay does occur.    Follow-Up: At [facility], you and your health needs are our priority.  As part of our continuing mission to provide you with exceptional heart care, we have created designated Provider Care Teams.  These Care Teams include  your primary Cardiologist (physician) and Advanced Practice Providers (APPs -  Physician Assistants and Nurse Practitioners) who all work together to provide you with the care you need, when you need it. You will need a follow up appointment in:  4 weeks.  You may see Mertie Moores, MD or one of the following Advanced Practice Providers on your designated Care Team: Richardson Dopp, PA-C [place], [place] . Daune Perch, NP

## 2018-11-12 NOTE — Progress Notes (Signed)
Cardiology Office Note:    Date:  11/12/2018   ID:  Kathy [name], DOB 06-24-1951, MRN [id number]  PCP:  Isaac Bliss, Rayford Halsted, MD  Cardiologist:  Mertie Moores, MD  Electrophysiologist:  None   Referring MD: Isaac Bliss, [place]*   Problem List 1.  Chronic diastolic CHF 2. Morbid obesity 3. Severe pulmonary HTN 4. Mild CAD  5.   Chief Complaint  Patient presents with  . Atrial Flutter        Kathy [name] is a 68 y.o. female with a hx of  MVP, chest pain  Hyperlipidemia and atrial fib We are asked to see her by Isaac Bliss, Rayford Halsted, MD for further eval of her atrial fib  I have seen her many years ago .   Developed CP,  Called EMS ECG at home showed atrial fib  She was admitted to the hospital with mid sternal chest pain , atrial fib, HTN.  Troponin levels rose to 4.96.    cath showed mild irreg. CT angiogram of the chest showed no aortic dissection  Echo shows  Normal LV systolic function - severe concnetric LCH Grade 2 diastolic dysfunction   Severe pulmonary HTN with estimated PA pressure of 84.   Gained 16 lbs after her hospitalization Was started on Lasix by Dr. Sharlene Motts  BP is now under control.  Breathing seems to be better Does not do any exercise ( limited by orthopedic issues )    Feb. 25, 2020: Kathy [name] is seen back today for follow-up of her chronic diastolic congestive heart failure, paroxysmal atrial fibrillation and pulmonary hypertension.  Takes her BP and HR daily.   Is in atrial flutter today   Has been more short of breath for the past several days . Has started working part time.   Has had her 1st part of her sleep study .   Needs to go back for her CPAP titration  Is working on weight loss.  Still fluctuating .    Past Medical History:  Diagnosis Date  . Adenomatous colon polyp    2012  . Anxiety   . Anxiety and depression   . Arthritis    Osteoarthritis, Dr Maureen Ralphs  . Depression   . Diverticulosis     . Fibromyalgia    Dr Tobie Lords, Rheu  . GERD (gastroesophageal reflux disease)   . H/O hiatal hernia   . Headache(784.0)    PAST HX MILD MIGRAINES  . Heart murmur    mitral valve prolapse--no symptoms  . History of colonic polyps 08/08/2011       . HTN (hypertension)   . IBS (irritable bowel syndrome)   . Internal hemorrhoid   . Lumbar radiculopathy   . Obesity   . Syncope    idiopathic, ? related to migraines or hyperventilation. Dr Leonie Man, Neurology,  ONE TIME EPISODE AND THIS WAS YEARS AGO    Past Surgical History:  Procedure Laterality Date  . ABDOMINAL HYSTERECTOMY     dysfunctional menses  . APPENDECTOMY    . BREAST EXCISIONAL BIOPSY Right   . CARDIAC CATHETERIZATION  06/26/2018  . CHOLECYSTECTOMY    . COLONOSCOPY  05/27/2002, 08/08/11   2003 diverticulosis, hemorrhoids 2012 same + cecal polyp  . KNEE ARTHROSCOPY  2008   right  . LEFT HEART CATH AND CORONARY ANGIOGRAPHY N/A 06/26/2018   Procedure: LEFT HEART CATH AND CORONARY ANGIOGRAPHY;  Surgeon: Wellington Hampshire, MD;  Location: [facility];  Service: Cardiovascular;  Laterality: N/A;  .  MELANOMA EXCISION Left 07/2013   foot  . TOTAL HIP ARTHROPLASTY  02/2010   left ; Dr Maureen Ralphs  . TOTAL HIP ARTHROPLASTY  03/18/2012   Procedure: TOTAL HIP ARTHROPLASTY;  Surgeon: Gearlean Alf, MD;  Location: [facility];  Service: Orthopedics;  Laterality: Right;  . UPPER GASTROINTESTINAL ENDOSCOPY  04/21/2009, 08/08/11   2010 gastritis ;2012 small hiatal hernia. Dr Carlean Purl    Current Medications: Current Meds  Medication Sig  . albuterol (PROVENTIL HFA;VENTOLIN HFA) 108 (90 Base) MCG/ACT inhaler Inhale 2 puffs into the lungs every 6 (six) hours as needed for wheezing or shortness of breath.  . ALPRAZolam (XANAX) 1 MG tablet Take 1 mg 4 times daily for 2 weeks, then 1 mg 3 times daily for 2 weeks and then keep at 1 mg twice daily thereafter. (Patient taking differently: Take 1 mg by mouth 2 (two) times daily. )  . apixaban  (ELIQUIS) 5 MG TABS tablet Take 1 tablet (5 mg total) by mouth 2 (two) times daily.  Marland Kitchen atorvastatin (LIPITOR) 40 MG tablet Take 1 tablet (40 mg total) by mouth daily at 6 PM.  . CALCIUM PO Take 1,000 mg by mouth daily.   . cyclobenzaprine (FLEXERIL) 10 MG tablet Take 1 tablet (10 mg total) by mouth 4 (four) times daily as needed. SPASMS (Patient taking differently: Take 10 mg by mouth 3 (three) times daily as needed for muscle spasms. )  . famotidine-calcium carbonate-magnesium hydroxide (PEPCID COMPLETE) 10-800-165 MG CHEW chewable tablet 1 -2 tablets as needed to prevent or treat heartburn (Patient taking differently: Chew 1 tablet by mouth daily as needed (heartburn). )  . FLUoxetine (PROZAC) 40 MG capsule Take 1 capsule (40 mg total) by mouth daily. (Patient taking differently: Take 40 mg by mouth every evening. )  . fluticasone (FLONASE) 50 MCG/ACT nasal spray Place 2 sprays into both nostrils daily. (Patient taking differently: Place 2 sprays into both nostrils daily as needed for allergies. )  . furosemide (LASIX) 20 MG tablet Take 1 tablet (20 mg total) by mouth daily.  Marland Kitchen HYDROcodone-acetaminophen (NORCO/VICODIN) 5-325 MG tablet Take 1 tablet by mouth every 6 (six) hours as needed for moderate pain.   . Multiple Vitamin (MULTIVITAMIN) tablet Take 1 tablet by mouth daily.  Marland Kitchen olmesartan (BENICAR) 40 MG tablet Take 1 tablet (40 mg total) by mouth daily.  Marland Kitchen omeprazole (PRILOSEC) 40 MG capsule TAKE 1 CAPSULE BY MOUTH EVERY DAY (Patient taking differently: Take 40 mg by mouth daily. )  . [DISCONTINUED] clobetasol cream (TEMOVATE) 0.09 % Apply 1 application topically as needed (rosacea).   . [DISCONTINUED] metoprolol succinate (TOPROL-XL) 100 MG 24 hr tablet Take 1 tablet (100 mg total) by mouth daily.     Allergies:   Achromycin [tetracycline hcl]; Penicillins; Sulfamethoxazole-trimethoprim; Nickel; and Zanaflex [tizanidine hydrochloride]   Social History   Socioeconomic History  . Marital  status: Married    Spouse name: Not on file  . Number of children: Not on file  . Years of education: Not on file  . Highest education level: Not on file  Occupational History  . Not on file  Social Needs  . Financial resource strain: Not on file  . Food insecurity:    Worry: Not on file    Inability: Not on file  . Transportation needs:    Medical: Not on file    Non-medical: Not on file  Tobacco Use  . Smoking status: Never Smoker  . Smokeless tobacco: Never Used  Substance and Sexual Activity  .  Alcohol use: Yes    Comment: occasional  . Drug use: No  . Sexual activity: Not on file  Lifestyle  . Physical activity:    Days per week: Not on file    Minutes per session: Not on file  . Stress: Not on file  Relationships  . Social connections:    Talks on phone: Not on file    Gets together: Not on file    Attends religious service: Not on file    Active member of club or organization: Not on file    Attends meetings of clubs or organizations: Not on file    Relationship status: Not on file  Other Topics Concern  . Not on file  Social History Narrative   0 caffeine drinks      Family History: The patient's family history includes Breast cancer in her paternal aunt; Breast cancer (age of onset: 87) in her mother; COPD in her maternal grandmother and mother; Diabetes in her father and paternal grandmother; Heart attack (age of onset: 35) in her brother; Heart attack (age of onset: 53) in her mother; Heart disease in her paternal grandmother; Heart failure (age of onset: 86) in her father; Hypertension in her father; Stroke in her paternal grandfather. There is no history of Colon cancer.  ROS:   Please see the history of present illness.     All other systems reviewed and are negative.  EKGs/Labs/Other Studies Reviewed:    The following studies were reviewed today:    Recent Labs: 06/25/2018: ALT 43; B Natriuretic Peptide 620.5 06/26/2018: Magnesium 1.9; TSH  1.782 11/12/2018: BUN 13; Creatinine, Ser 0.87; Hemoglobin 14.2; Platelets 218; Potassium 3.6; Sodium 143  Recent Lipid Panel    Component Value Date/Time   CHOL 235 (H) 04/30/2018 1134   TRIG 169.0 (H) 04/30/2018 1134   HDL 54.70 04/30/2018 1134   CHOLHDL 4 04/30/2018 1134   VLDL 33.8 04/30/2018 1134   LDLCALC 146 (H) 04/30/2018 1134   LDLDIRECT 143.2 01/22/2013 0856     Physical Exam: Blood pressure (!) 160/112, pulse (!) 142, height 5\' 8"  (1.727 m), weight 286 lb 12.8 oz (130.1 kg), SpO2 98 %.  GEN:  Well nourished, well developed in no acute distress HEENT: Normal NECK: No JVD; No carotid bruits LYMPHATICS: No lymphadenopathy CARDIAC:  RR , tachycardic,   Occasional irreg.  RESPIRATORY:  Clear to auscultation without rales, wheezing or rhonchi  ABDOMEN: Soft, non-tender, non-distended MUSCULOSKELETAL:  No edema;  Has a poorly healing biopsy area on left foot.  SKIN: Warm and dry NEUROLOGIC:  Alert and oriented x 3   EKG:   November 12, 2018: Atrial flutter with a variable A-V block.  Her heart rate is 133.  Nonspecific ST changes.  ASSESSMENT:    1. PAF (paroxysmal atrial fibrillation) ([place])   2. Atypical atrial flutter (H[facility])   3. Chronic diastolic (congestive) heart failure (H[facility])    PLAN:       Chronic diastolic congestive heart failure: Seems to be fairly stable.  2.  Pulmonary hypertension: Her pulmonary pressures improved during her last echo.   She has been found to have obstructive sleep apnea.  I have encouraged her to finish her sleep study as soon as possible.  I have a strong her surgical losing weight.  3.  Paroxysmal atrial fibrillation:   She is back in atrial flutter today  She took 2 Eliquis tabs in the morning of on Feb. 18 and then did not take the evening does.  We have documentation that she was in NSR on that day  ( by the echo ECG and tissue doppler info)  She should be safe for cardioversion   4.  Coronary artery disease: Heart  catheterization revealed very minimal coronary artery disease.  5.  Morbid obesity :   Encouraged weight loss.      Medication Adjustments/Labs and Tests Ordered: Current medicines are reviewed at length with the patient today.  Concerns regarding medicines are outlined above.  Orders Placed This Encounter  Procedures  . Basic Metabolic Panel (BMET)  . CBC  . EKG 12-Lead   Meds ordered this encounter  Medications  . amiodarone (PACERONE) 200 MG tablet    Sig: Take 1 tablet (200 mg total) by mouth 2 (two) times daily.    Dispense:  60 tablet    Refill:  11    Patient Instructions  Medication Instructions:  Your physician has recommended you make the following change in your medication:  START Amiodarone (Pacerone) 200 mg twice daily  If you need a refill on your cardiac medications before your next appointment, please call your pharmacy.    Lab work: TODAY - BMET, CBC  If you have labs (blood work) drawn today and your tests are completely normal, you will receive your results only by: Marland Kitchen MyChart Message (if you have MyChart) OR . A paper copy in the mail If you have any lab test that is abnormal or we need to change your treatment, we will call you to review the results.   Testing/Procedures: You are scheduled for a Cardioversion on Thursday Feb. 27 with Dr. Margaretann Loveless.  Please arrive at the [facility] (Main Entrance A) at [facility]: [address] [place], [place] [address] at 12 pm. (1 hour prior to procedure unless lab work is needed; if lab work is needed arrive 1.5 hours ahead)  DIET: Nothing to eat or drink after midnight except a sip of water with medications (see medication instructions below)  Medication Instructions: Hold Lasix (Furosemide) on the morning of the procedure  Continue your anticoagulant: Eliquis You will need to continue your anticoagulant after your  procedure until you  are told by your provider that it is safe to stop   You must  have a responsible person to drive you home and stay in the waiting area during your procedure. Failure to do so could result in cancellation.  Bring your insurance cards.  *Special Note: Every effort is made to have your procedure done on time. Occasionally there are emergencies that occur at the hospital that may cause delays. Please be patient if a delay does occur.    Follow-Up: At [facility], you and your health needs are our priority.  As part of our continuing mission to provide you with exceptional heart care, we have created designated Provider Care Teams.  These Care Teams include your primary Cardiologist (physician) and Advanced Practice Providers (APPs -  Physician Assistants and Nurse Practitioners) who all work together to provide you with the care you need, when you need it. You will need a follow up appointment in:  4 weeks.  You may see Mertie Moores, MD or one of the following Advanced Practice Providers on your designated Care Team: Richardson Dopp, PA-C [place], [place] . Daune Perch, NP       Signed, Mertie Moores, MD  11/12/2018 5:32 PM    [place]

## 2018-11-14 ENCOUNTER — Other Ambulatory Visit: Payer: Self-pay

## 2018-11-14 ENCOUNTER — Ambulatory Visit (HOSPITAL_COMMUNITY)
Admission: RE | Admit: 2018-11-14 | Discharge: 2018-11-14 | Disposition: A | Discharge status: Home / Self Care | Payer: Medicare Other | Attending: Internal Medicine | Admitting: Internal Medicine

## 2018-11-14 ENCOUNTER — Encounter (HOSPITAL_COMMUNITY)
Admission: RE | Disposition: A | Discharge status: Home / Self Care | Payer: Self-pay | Source: Home / Self Care | Attending: Internal Medicine

## 2018-11-14 DIAGNOSIS — I480 Paroxysmal atrial fibrillation: Secondary | ICD-10-CM

## 2018-11-14 DIAGNOSIS — Z5309 Procedure and treatment not carried out because of other contraindication: Secondary | ICD-10-CM | POA: Diagnosis not present

## 2018-11-14 DIAGNOSIS — I4891 Unspecified atrial fibrillation: Secondary | ICD-10-CM | POA: Insufficient documentation

## 2018-11-14 SURGERY — CANCELLED PROCEDURE

## 2018-11-14 NOTE — Progress Notes (Signed)
Patient arrived to Endoscopy and appeared to be in NSR. MD confirmed NSR. MD spoke with patient and patient discharged to home prior to entering the procedure room.

## 2018-11-14 NOTE — Progress Notes (Signed)
Confirmed patient was in sinus rhythm prior to DCCV, therefore cardioversion cancelled.   Discussed with patient and family in the room. Answered all questions to the best of my ability.   Kathy [name] 12:46 PM 11/14/18

## 2018-11-15 ENCOUNTER — Other Ambulatory Visit: Payer: Self-pay | Admitting: *Deleted

## 2018-11-15 DIAGNOSIS — F419 Anxiety disorder, unspecified: Secondary | ICD-10-CM

## 2018-11-15 MED ORDER — ALPRAZOLAM 1 MG PO TABS: 1.0000 mg | ORAL_TABLET | Freq: Two times a day (BID) | ORAL | 2 refills | Status: DC

## 2018-11-24 ENCOUNTER — Encounter (HOSPITAL_BASED_OUTPATIENT_CLINIC_OR_DEPARTMENT_OTHER): Payer: No Typology Code available for payment source

## 2018-11-28 ENCOUNTER — Encounter: Payer: Self-pay | Admitting: Physician Assistant

## 2018-12-03 ENCOUNTER — Other Ambulatory Visit: Payer: Self-pay | Admitting: *Deleted

## 2018-12-03 MED ORDER — OLMESARTAN MEDOXOMIL 40 MG PO TABS: 40.0000 mg | ORAL_TABLET | Freq: Every day | ORAL | 1 refills | Status: DC

## 2018-12-04 ENCOUNTER — Telehealth: Payer: Self-pay | Admitting: *Deleted

## 2018-12-04 NOTE — Telephone Encounter (Signed)
Left a detailed message re: appt 12/10/2018 and that appt needed to be cancelled for pt to call the office back to reschedule.

## 2018-12-10 ENCOUNTER — Ambulatory Visit: Payer: Medicare Other | Admitting: Physician Assistant

## 2018-12-11 ENCOUNTER — Encounter (HOSPITAL_BASED_OUTPATIENT_CLINIC_OR_DEPARTMENT_OTHER): Payer: Medicare Other

## 2018-12-13 ENCOUNTER — Telehealth: Payer: Self-pay | Admitting: Internal Medicine

## 2018-12-13 NOTE — Telephone Encounter (Unsigned)
Copied from [place] [phone]. Topic: General - Other >> Dec 13, 2018  3:15 PM Celene Kras A wrote: Reason for CRM: Pt left vm requesting refill on FLUoxetine (PROZAC) 40 MG capsule [[id number]]. Pt took her last capsule on Wednesday and is requesting at least a supply until Dr. Jerilee Hoh comes back to the office. Please contact pt and advise.  [id number] - [place], [place] - [name] [place] [address] Phone: [phone] Fax: [phone] Not a 24 hour pharmacy; exact hours not known

## 2018-12-17 ENCOUNTER — Telehealth: Payer: Self-pay | Admitting: Cardiovascular Disease

## 2018-12-17 ENCOUNTER — Other Ambulatory Visit: Payer: Self-pay | Admitting: Internal Medicine

## 2018-12-17 DIAGNOSIS — F419 Anxiety disorder, unspecified: Secondary | ICD-10-CM

## 2018-12-17 MED ORDER — FLUOXETINE HCL 40 MG PO CAPS: 40.0000 mg | ORAL_CAPSULE | Freq: Every day | ORAL | 0 refills | Status: DC

## 2018-12-17 NOTE — Telephone Encounter (Signed)
Rx sent 

## 2018-12-17 NOTE — Telephone Encounter (Signed)
Pt called to report that she had an episode this morning .Marland Kitchen.. she bent down to put something away in her closet and she felt nauseated and had some increase in her reflux.. she said her chest felt a little heavy but only lasted a minute or so.. she denies dizziness, palpitations, sob... she did get very diaphoretic.   Pt says she feels better now but wanted to let Dr. Acie Fredrickson know it happened in case it happens again.  In reviewing the med list with the pt.. she says she only took one dose of her Amiodarone several weeks ago and didn't feel right after talking it so she has not taken any more. She denies palpitations and no symptoms of AFIB... advised I will need to let Dr. Acie Fredrickson know.   Pt to rest and and monitor her symptoms if they return. She will look for her BP cuff and let us know if her BP is abnormal. Will forward to Dr. Acie Fredrickson for his review.

## 2018-12-17 NOTE — Telephone Encounter (Signed)
  Patient is c/o not feeling well, nausea, sweating, and chest pressure. She is concerned she could be having another episode of Afib. Would like to speak to a nurse.

## 2018-12-17 NOTE — Telephone Encounter (Signed)
Pt advised Dr. Elmarie Shiley recommendations and will monitor her VS and call if she has any further problems... she is consenting to a virtual visit for already scheduled appt with Robbie Lis PA on 01/07/19 but will be rescheduled with Dr. Acie Fredrickson for 12/31/18.   Pt consented to [facility] visit on 12/17/18.Marland Kitchen consent sent to the pt via My Chart... pt will call if she has any questions.

## 2018-12-17 NOTE — Telephone Encounter (Signed)
She has converted to NSR on her own and did not need amiodarone. Her symptoms sound like they were self limiting . Please have her monitor hier HR and BP and let us know if her HR is elevated and irregular

## 2018-12-24 ENCOUNTER — Telehealth: Payer: Self-pay | Admitting: Cardiovascular Disease

## 2018-12-24 NOTE — Telephone Encounter (Signed)
New Message  Pt c/o medication issue:  1. Name of Medication: furosemide (LASIX) 20 MG tablet       2. How are you currently taking this medication (dosage and times per day)?   3. Are you having a reaction (difficulty breathing--STAT)?   4. What is your medication issue? Patient states that when she weighs herself her weight goes back and forth 5-7lbs. She is not sure if the Lasix is working or not. Please call to discuss.

## 2018-12-24 NOTE — Telephone Encounter (Signed)
Spoke with patient who states her weight varies by 5-7 lb in a day on occasion over the last few weeks, states then weight will decrease 2 or 3 lbs the next day. Weighs at the same day every day. Notices ankles are swollen occasionally but it decreases quickly. Denies SOB and orthopnea. No decrease in energy. States she has had cough and cold over the past few weeks and is not exercising. Also c/o night sweats and wonders if those are related. Denies fever. I advised that night sweats can be caused by sleep apnea or other issues, but is not likely related to lasix therapy. She has been undergoing sleep evaluation and CPAP titration. Admits to more eating since having to be confined to home because of Covid 19 and because of stress of husband's illness. I advised her to monitor her caloric intake and on days that she weighs herself and is >3 lb heavier, to take an additional lasix 20 mg. I advised her also to try to decrease caloric intake and to monitor these things in addition to BP to report during virtual visit with Dr. Acie Fredrickson on 4/14. She does not take a potassium supplement but takes olmesartan. We will need to measure a bmet soon. I will forward message to Dr. Acie Fredrickson for advice. I advised patient to call back with questions or concerns prior to visit. She verbalized understanding and agreement with plan and thanked me for the call.

## 2018-12-25 ENCOUNTER — Telehealth: Payer: Self-pay | Admitting: Cardiovascular Disease

## 2018-12-25 NOTE — Telephone Encounter (Signed)
Spoke with patient who confirmed all demographics and e-mail.  She currently uses My Chart

## 2018-12-26 NOTE — Telephone Encounter (Signed)
Agree with note by Christen Bame, RN Some of this weight gain sound like overeating. Agree with the plan to reduce caloric intake. Agree with taking additional lasix if she gains > 3 lbs in a day if is seems to be related to fluid retention ( swollen ankles) Will discuss these with her on April 14

## 2018-12-31 ENCOUNTER — Telehealth: Payer: Medicare Other | Admitting: Cardiovascular Disease

## 2018-12-31 ENCOUNTER — Telehealth (INDEPENDENT_AMBULATORY_CARE_PROVIDER_SITE_OTHER): Payer: Medicare Other | Admitting: Cardiovascular Disease

## 2018-12-31 ENCOUNTER — Encounter: Payer: Self-pay | Admitting: Cardiovascular Disease

## 2018-12-31 ENCOUNTER — Other Ambulatory Visit: Payer: Self-pay

## 2018-12-31 VITALS — BP 141/79 | HR 69 | Ht 68.0 in | Wt 286.0 lb

## 2018-12-31 DIAGNOSIS — I480 Paroxysmal atrial fibrillation: Secondary | ICD-10-CM

## 2018-12-31 DIAGNOSIS — I272 Pulmonary hypertension, unspecified: Secondary | ICD-10-CM

## 2018-12-31 DIAGNOSIS — I5032 Chronic diastolic (congestive) heart failure: Secondary | ICD-10-CM | POA: Insufficient documentation

## 2018-12-31 DIAGNOSIS — Z7189 Other specified counseling: Secondary | ICD-10-CM | POA: Diagnosis not present

## 2018-12-31 DIAGNOSIS — I4819 Other persistent atrial fibrillation: Secondary | ICD-10-CM

## 2018-12-31 MED ORDER — POTASSIUM CHLORIDE ER 10 MEQ PO TBCR: 10.0000 meq | EXTENDED_RELEASE_TABLET | Freq: Every day | ORAL | 3 refills | Status: DC

## 2018-12-31 MED ORDER — FUROSEMIDE 40 MG PO TABS: ORAL_TABLET | ORAL | 3 refills | Status: DC

## 2018-12-31 MED ORDER — FUROSEMIDE 20 MG PO TABS: ORAL_TABLET | ORAL | 3 refills | Status: DC

## 2018-12-31 NOTE — Patient Instructions (Addendum)
Medication Instructions:  Your physician has recommended you make the following change in your medication:  INCREASE Lasix (Furosemide) to 40 mg every other day alternating with 20 mg  START Kdur (potassium chloride) 10 mEq every day  If you need a refill on your cardiac medications before your next appointment, please call your pharmacy.     Lab work: Your physician recommends that you return for lab work in: 1 month or as soon as it is safe with Covid 19 restrictions     Testing/Procedures: None Ordered   Follow-Up: At [facility], you and your health needs are our priority.  As part of our continuing mission to provide you with exceptional heart care, we have created designated Provider Care Teams.  These Care Teams include your primary Cardiologist (physician) and Advanced Practice Providers (APPs -  Physician Assistants and Nurse Practitioners) who all work together to provide you with the care you need, when you need it. You will need a follow up appointment in:  3 months.  Please call our office 2 months in advance to schedule this appointment.  You may see Mertie Moores, MD or one of the following Advanced Practice Providers on your designated Care Team: Richardson Dopp, PA-C [place], [place] . Daune Perch, NP

## 2018-12-31 NOTE — Progress Notes (Signed)
Virtual Visit via Video Note   This visit type was conducted due to national recommendations for restrictions regarding the COVID-19 Pandemic (e.g. social distancing) in an effort to limit this patient's exposure and mitigate transmission in our community.  Due to her co-morbid illnesses, this patient is at least at moderate risk for complications without adequate follow up.  This format is felt to be most appropriate for this patient at this time.  All issues noted in this document were discussed and addressed.  A limited physical exam was performed with this format.  Please refer to the patient's chart for her consent to telehealth for [facility].   Evaluation Performed:  Follow-up visit  Date:  12/31/2018   ID:  Kathy [name], DOB 08-02-1951, MRN [id number]  Patient Location: Home  Provider Location: Home  PCP:  Isaac Bliss, Rayford Halsted, MD  Cardiologist:  Mertie Moores, MD  Electrophysiologist:  None   Chief Complaint:  Atrial fib congestive heart failure, morbid obesity, severe pulmonary hypertension, mild coronary artery disease  History of Present Illness:    Kathy [name] is a 68 y.o. female who presents via audio/video conferencing for a telehealth visit today.   Video chat via [email or web address] for the first 20 min of the call. Then converted to phone visit due to conectivity issues   Kathy [name] is a 68 year old female with a history of chronic diastolic congestive heart failure, morbid obesity, severe pulmonary hypertension.  She recently developed atrial fibrillation.  When I saw her in February, 2020 she was in atrial fibrillation.  We schedule her for cardioversion but when she showed up on November 14, 2018 she was back in normal sinus rhythm.  Has had 2 episodes of palpitations. She thinks maybe due to anxiety.  Did not feel like her AF   No fever , cough, Social distancing  Has been wearing a mask   The patient does not have symptoms concerning for  COVID-19 infection (fever, chills, cough, or new shortness of breath).    Past Medical History:  Diagnosis Date  . Adenomatous colon polyp    2012  . Anxiety   . Anxiety and depression   . Arthritis    Osteoarthritis, Dr Maureen Ralphs  . Depression   . Diverticulosis   . Fibromyalgia    Dr Tobie Lords, Rheu  . GERD (gastroesophageal reflux disease)   . H/O hiatal hernia   . Headache(784.0)    PAST HX MILD MIGRAINES  . Heart murmur    mitral valve prolapse--no symptoms  . History of colonic polyps 08/08/2011       . HTN (hypertension)   . IBS (irritable bowel syndrome)   . Internal hemorrhoid   . Lumbar radiculopathy   . Obesity   . Syncope    idiopathic, ? related to migraines or hyperventilation. Dr Leonie Man, Neurology,  ONE TIME EPISODE AND THIS WAS YEARS AGO   Past Surgical History:  Procedure Laterality Date  . ABDOMINAL HYSTERECTOMY     dysfunctional menses  . APPENDECTOMY    . BREAST EXCISIONAL BIOPSY Right   . CARDIAC CATHETERIZATION  06/26/2018  . CHOLECYSTECTOMY    . COLONOSCOPY  05/27/2002, 08/08/11   2003 diverticulosis, hemorrhoids 2012 same + cecal polyp  . KNEE ARTHROSCOPY  2008   right  . LEFT HEART CATH AND CORONARY ANGIOGRAPHY N/A 06/26/2018   Procedure: LEFT HEART CATH AND CORONARY ANGIOGRAPHY;  Surgeon: Wellington Hampshire, MD;  Location: [facility];  Service: Cardiovascular;  Laterality: N/A;  .  MELANOMA EXCISION Left 07/2013   foot  . TOTAL HIP ARTHROPLASTY  02/2010   left ; Dr Maureen Ralphs  . TOTAL HIP ARTHROPLASTY  03/18/2012   Procedure: TOTAL HIP ARTHROPLASTY;  Surgeon: Gearlean Alf, MD;  Location: [facility];  Service: Orthopedics;  Laterality: Right;  . UPPER GASTROINTESTINAL ENDOSCOPY  04/21/2009, 08/08/11   2010 gastritis ;2012 small hiatal hernia. Dr Carlean Purl     Current Meds  Medication Sig  . albuterol (PROVENTIL HFA;VENTOLIN HFA) 108 (90 Base) MCG/ACT inhaler Inhale 2 puffs into the lungs every 6 (six) hours as needed for wheezing or shortness  of breath.  . ALPRAZolam (XANAX) 1 MG tablet Take 1 tablet (1 mg total) by mouth 2 (two) times daily.  Marland Kitchen apixaban (ELIQUIS) 5 MG TABS tablet Take 1 tablet (5 mg total) by mouth 2 (two) times daily.  Marland Kitchen atorvastatin (LIPITOR) 40 MG tablet Take 1 tablet (40 mg total) by mouth daily at 6 PM.  . CALCIUM PO Take 1,000 mg by mouth daily.   . clobetasol (TEMOVATE) 0.05 % external solution Apply 1 application topically daily as needed (rosacea).  . cyclobenzaprine (FLEXERIL) 10 MG tablet Take 1 tablet (10 mg total) by mouth 4 (four) times daily as needed. SPASMS  . famotidine-calcium carbonate-magnesium hydroxide (PEPCID COMPLETE) 10-800-165 MG CHEW chewable tablet 1 -2 tablets as needed to prevent or treat heartburn  . fluocinonide (LIDEX) 0.05 % external solution Apply 1 application topically daily as needed (rosacea).  Marland Kitchen FLUoxetine (PROZAC) 40 MG capsule Take 1 capsule (40 mg total) by mouth daily.  . fluticasone (FLONASE) 50 MCG/ACT nasal spray Place 2 sprays into both nostrils daily.  . furosemide (LASIX) 20 MG tablet Take 1 tablet (20 mg total) by mouth daily.  Marland Kitchen HYDROcodone-acetaminophen (NORCO/VICODIN) 5-325 MG tablet Take 1 tablet by mouth every 6 (six) hours as needed for moderate pain.   . metoprolol succinate (TOPROL-XL) 100 MG 24 hr tablet Take 1 tablet (100 mg total) by mouth daily.  . Multiple Vitamin (MULTIVITAMIN) tablet Take 1 tablet by mouth daily.  Marland Kitchen olmesartan (BENICAR) 40 MG tablet Take 1 tablet (40 mg total) by mouth daily.  Marland Kitchen omeprazole (PRILOSEC) 40 MG capsule TAKE 1 CAPSULE BY MOUTH EVERY DAY     Allergies:   Achromycin [tetracycline hcl]; Penicillins; Sulfamethoxazole-trimethoprim; Nickel; and Zanaflex [tizanidine hydrochloride]   Social History   Tobacco Use  . Smoking status: Never Smoker  . Smokeless tobacco: Never Used  Substance Use Topics  . Alcohol use: Yes    Comment: occasional  . Drug use: No     Family Hx: The patient's family history includes Breast  cancer in her paternal aunt; Breast cancer (age of onset: 85) in her mother; COPD in her maternal grandmother and mother; Diabetes in her father and paternal grandmother; Heart attack (age of onset: 54) in her brother; Heart attack (age of onset: 58) in her mother; Heart disease in her paternal grandmother; Heart failure (age of onset: 39) in her father; Hypertension in her father; Stroke in her paternal grandfather. There is no history of Colon cancer.  ROS:   Please see the history of present illness.     All other systems reviewed and are negative.   Prior CV studies:   The following studies were reviewed today:    Labs/Other Tests and Data Reviewed:    EKG:  No ECG reviewed.  Recent Labs: 06/25/2018: ALT 43; B Natriuretic Peptide 620.5 06/26/2018: Magnesium 1.9; TSH 1.782 11/12/2018: BUN 13; Creatinine, Ser 0.87; Hemoglobin  14.2; Platelets 218; Potassium 3.6; Sodium 143   Recent Lipid Panel Lab Results  Component Value Date/Time   CHOL 235 (H) 04/30/2018 11:34 AM   TRIG 169.0 (H) 04/30/2018 11:34 AM   HDL 54.70 04/30/2018 11:34 AM   CHOLHDL 4 04/30/2018 11:34 AM   LDLCALC 146 (H) 04/30/2018 11:34 AM   LDLDIRECT 143.2 01/22/2013 08:56 AM    Wt Readings from Last 3 Encounters:  12/31/18 286 lb (129.7 kg)  11/12/18 286 lb 12.8 oz (130.1 kg)  10/23/18 284 lb 12.8 oz (129.2 kg)     Objective:    Vital Signs:  BP (!) 141/79   Pulse 69   Ht 5\' 8"  (1.727 m)   Wt 286 lb (129.7 kg)   BMI 43.49 kg/m    General:   Appears healthy,   NAD HEENT:   No obvious JVD or lymphadenopathy Resp:   Normal work of breathing,   resp rate is normal  CV :   BP and HR are normal ,  No edema Abd:   No abdomina swelling , Ext:   No clubbing, cyanosis, or edema  Neuro:   Alert and oriented x 3.   No obvious motor deficits Skin : no obvious rashes    ASSESSMENT & PLAN:    1. Chronic diastolic CHF Advised diet , exercise, weight loss Will increase her lasix to 40 mg a day / alternate  with 20 mg a day  , add Kdur 10 meq a day  Check BMP in  1 month   2.  Pulmonary HTN:   Likely due to OSA and obesity hypoventilation  3.   Persistent atrial fib:   Converted to NSR spontaneously .  Cont. Eliquis  Cont .   COVID-19 Education: The signs and symptoms of COVID-19 were discussed with the patient and how to seek care for testing (follow up with PCP or arrange E-visit).  The importance of social distancing was discussed today.  Time:   Today, I have spent 30  minutes with the patient with telehealth technology discussing the above problems.     Medication Adjustments/Labs and Tests Ordered: Current medicines are reviewed at length with the patient today.  Concerns regarding medicines are outlined above.   Tests Ordered: No orders of the defined types were placed in this encounter.   Medication Changes: No orders of the defined types were placed in this encounter.   Disposition:  Follow up in 3 month(s)  Signed, Mertie Moores, MD  12/31/2018 11:45 AM    [place]

## 2019-01-07 ENCOUNTER — Ambulatory Visit: Payer: Medicare Other | Admitting: Physician Assistant

## 2019-01-14 ENCOUNTER — Encounter (HOSPITAL_BASED_OUTPATIENT_CLINIC_OR_DEPARTMENT_OTHER): Payer: Medicare Other

## 2019-01-15 ENCOUNTER — Telehealth: Payer: Self-pay | Admitting: Cardiovascular Disease

## 2019-01-15 NOTE — Telephone Encounter (Signed)
New Message            Patient is calling back because she has questions concerning the Virtual visit she had today. Pls call to advise.

## 2019-01-15 NOTE — Telephone Encounter (Signed)
Spoke with patient who states she is having GI upset and wonders if it is one of her medications. She recently started K-dur but states she has not been monitoring if symptoms have occurred only since starting that medication. I advised her that GI upset can occur with Kdur and to try taking it with a full glass of water to ease GI distress. I advised that if she continues to have problems, to call back so that we can prescribe the effervescent tablets for her to try.  She reports she is feeling well; states she has not been able to exercise much because of knee pain but she plans to continue to work on increasing physical activity. She continues to have stress r/t husband's cancer treatment. She  thanked me for calling her back and listening to her.

## 2019-02-04 ENCOUNTER — Ambulatory Visit (INDEPENDENT_AMBULATORY_CARE_PROVIDER_SITE_OTHER): Payer: Medicare Other | Admitting: Internal Medicine

## 2019-02-04 ENCOUNTER — Other Ambulatory Visit: Payer: Self-pay

## 2019-02-04 DIAGNOSIS — E785 Hyperlipidemia, unspecified: Secondary | ICD-10-CM

## 2019-02-04 DIAGNOSIS — F419 Anxiety disorder, unspecified: Secondary | ICD-10-CM

## 2019-02-04 DIAGNOSIS — I480 Paroxysmal atrial fibrillation: Secondary | ICD-10-CM | POA: Diagnosis not present

## 2019-02-04 DIAGNOSIS — I10 Essential (primary) hypertension: Secondary | ICD-10-CM | POA: Diagnosis not present

## 2019-02-04 DIAGNOSIS — K219 Gastro-esophageal reflux disease without esophagitis: Secondary | ICD-10-CM

## 2019-02-04 DIAGNOSIS — I5032 Chronic diastolic (congestive) heart failure: Secondary | ICD-10-CM

## 2019-02-04 MED ORDER — OMEPRAZOLE 40 MG PO CPDR: DELAYED_RELEASE_CAPSULE | ORAL | 3 refills | Status: DC

## 2019-02-04 MED ORDER — ATORVASTATIN CALCIUM 40 MG PO TABS: 40.0000 mg | ORAL_TABLET | Freq: Every day | ORAL | 3 refills | Status: DC

## 2019-02-04 MED ORDER — ALPRAZOLAM 1 MG PO TABS: 1.0000 mg | ORAL_TABLET | Freq: Two times a day (BID) | ORAL | 2 refills | Status: DC

## 2019-02-04 MED ORDER — FLUOXETINE HCL 40 MG PO CAPS: 40.0000 mg | ORAL_CAPSULE | Freq: Every day | ORAL | 0 refills | Status: DC

## 2019-02-04 MED ORDER — METOPROLOL SUCCINATE ER 100 MG PO TB24: 100.0000 mg | ORAL_TABLET | Freq: Every day | ORAL | 1 refills | Status: DC

## 2019-02-04 MED ORDER — APIXABAN 5 MG PO TABS: 5.0000 mg | ORAL_TABLET | Freq: Two times a day (BID) | ORAL | 3 refills | Status: DC

## 2019-02-04 MED ORDER — OLMESARTAN MEDOXOMIL 40 MG PO TABS: 40.0000 mg | ORAL_TABLET | Freq: Every day | ORAL | 1 refills | Status: DC

## 2019-02-04 NOTE — Progress Notes (Signed)
Virtual Visit via Telephone Note  I connected with Kathy [name] on 02/04/19 at  3:00 PM EDT by telephone and verified that I am speaking with the correct person using two identifiers.   I discussed the limitations, risks, security and privacy concerns of performing an evaluation and management service by telephone and the availability of in person appointments. I also discussed with the patient that there may be a patient responsible charge related to this service. The patient expressed understanding and agreed to proceed.  We initially attempted to connect via video chat but were unable to due to technical difficulties on the patient's end, so we converted this visit to a phone visit.   Location patient: home Location provider: work office Participants present for the call: patient, provider Patient did not have a visit in the prior 7 days to address this/these issue(s).   History of Present Illness:  This is a scheduled visit for follow up of chronic conditions and also for medication refills. Since she last saw me, her husband has been diagnosed with throat cancer and is undergoing radiation, is having difficulty swallowing; all this has created some increased anxiety. She saw Dr. Acie Fredrickson who increased her lasix from 20 to 40 mg and started her on K supplementation.She was scheduled for DCCV in Feb but was cancelled as she was in NSR when she arrived that day. She has been doing otherwise well, has no acute complaints. PMH is significant for: History of anxiety, fibromyalgia followed by rheumatology, hypertension and new diagnosis of atrial fibrillation.    Observations/Objective: Patient sounds cheerful and well on the phone. I do not appreciate any increased work of breathing. Speech and thought processing are grossly intact. Patient reported vitals: BP 162/84 HR 69 reported today by patient   Current Outpatient Medications:  .  albuterol (PROVENTIL HFA;VENTOLIN HFA) 108  (90 Base) MCG/ACT inhaler, Inhale 2 puffs into the lungs every 6 (six) hours as needed for wheezing or shortness of breath., Disp: 1 Inhaler, Rfl: 2 .  ALPRAZolam (XANAX) 1 MG tablet, Take 1 tablet (1 mg total) by mouth 2 (two) times daily., Disp: 60 tablet, Rfl: 2 .  apixaban (ELIQUIS) 5 MG TABS tablet, Take 1 tablet (5 mg total) by mouth 2 (two) times daily., Disp: 180 tablet, Rfl: 3 .  atorvastatin (LIPITOR) 40 MG tablet, Take 1 tablet (40 mg total) by mouth daily at 6 PM., Disp: 90 tablet, Rfl: 3 .  CALCIUM PO, Take 1,000 mg by mouth daily. , Disp: , Rfl:  .  clobetasol (TEMOVATE) 0.05 % external solution, Apply 1 application topically daily as needed (rosacea)., Disp: , Rfl:  .  cyclobenzaprine (FLEXERIL) 10 MG tablet, Take 1 tablet (10 mg total) by mouth 4 (four) times daily as needed. SPASMS, Disp: 30 tablet, Rfl: 5 .  famotidine-calcium carbonate-magnesium hydroxide (PEPCID COMPLETE) 10-800-165 MG CHEW chewable tablet, 1 -2 tablets as needed to prevent or treat heartburn, Disp: , Rfl:  .  fluocinonide (LIDEX) 0.05 % external solution, Apply 1 application topically daily as needed (rosacea)., Disp: , Rfl:  .  FLUoxetine (PROZAC) 40 MG capsule, Take 1 capsule (40 mg total) by mouth daily., Disp: 90 capsule, Rfl: 0 .  fluticasone (FLONASE) 50 MCG/ACT nasal spray, Place 2 sprays into both nostrils daily., Disp: 16 g, Rfl: 6 .  furosemide (LASIX) 40 MG tablet, Take 40 mg every other day alternating with 20 mg tablets, Disp: 60 tablet, Rfl: 3 .  HYDROcodone-acetaminophen (NORCO/VICODIN) 5-325 MG tablet, Take  1 tablet by mouth every 6 (six) hours as needed for moderate pain. , Disp: , Rfl:  .  metoprolol succinate (TOPROL-XL) 100 MG 24 hr tablet, Take 1 tablet (100 mg total) by mouth daily., Disp: 90 tablet, Rfl: 1 .  Multiple Vitamin (MULTIVITAMIN) tablet, Take 1 tablet by mouth daily., Disp: , Rfl:  .  olmesartan (BENICAR) 40 MG tablet, Take 1 tablet (40 mg total) by mouth daily., Disp: 90 tablet,  Rfl: 1 .  omeprazole (PRILOSEC) 40 MG capsule, TAKE 1 CAPSULE BY MOUTH EVERY DAY, Disp: 90 capsule, Rfl: 3 .  potassium chloride (K-DUR) 10 MEQ tablet, Take 1 tablet (10 mEq total) by mouth daily., Disp: 90 tablet, Rfl: 3  Review of Systems:  Constitutional: Denies fever, chills, diaphoresis, appetite change and fatigue.  HEENT: Denies photophobia, eye pain, redness, hearing loss, ear pain, congestion, sore throat, rhinorrhea, sneezing, mouth sores, trouble swallowing, neck pain, neck stiffness and tinnitus.   Respiratory: Denies SOB, DOE, cough, chest tightness,  and wheezing.   Cardiovascular: Denies chest pain, palpitations and leg swelling.  Gastrointestinal: Denies nausea, vomiting, abdominal pain, diarrhea, constipation, blood in stool and abdominal distention.  Genitourinary: Denies dysuria, urgency, frequency, hematuria, flank pain and difficulty urinating.  Endocrine: Denies: hot or cold intolerance, sweats, changes in hair or nails, polyuria, polydipsia. Musculoskeletal: Denies myalgias, back pain, joint swelling, arthralgias and gait problem.  Skin: Denies pallor, rash and wound.  Neurological: Denies dizziness, seizures, syncope, weakness, light-headedness, numbness and headaches.  Hematological: Denies adenopathy. Easy bruising, personal or family bleeding history  Psychiatric/Behavioral: Denies suicidal ideation, mood changes, confusion, nervousness, sleep disturbance and agitation   Assessment and Plan:  Anxiety  -Will refill xanax today. -She has done well with the taper from QID to BID.  Morbid obesity ([place]) -Discuss next live visit.  Essential hypertension  -Refill meds today. -BP is up today. -She will take more frequent ambulatory BP measurements and contact me when she has accumulated 2-3 weeks of data.  Paroxysmal atrial fibrillation (HCC)  -Rate controlled, anticoagulated on Eliquis.  Chronic diastolic CHF (congestive heart failure) (HCC) -Compensated,  followed by cards.  Gastroesophageal reflux disease without esophagitis  -refill PPI  Hyperlipidemia, unspecified hyperlipidemia type -Refill statin. -Last LDL 146 in 8/19.   I discussed the assessment and treatment plan with the patient. The patient was provided an opportunity to ask questions and all were answered. The patient agreed with the plan and demonstrated an understanding of the instructions.   The patient was advised to call back or seek an in-person evaluation if the symptoms worsen or if the condition fails to improve as anticipated.  I provided 28 minutes of non-face-to-face time during this encounter.   Lelon Frohlich, MD [facility]

## 2019-02-17 DIAGNOSIS — M150 Primary generalized (osteo)arthritis: Secondary | ICD-10-CM | POA: Diagnosis not present

## 2019-02-17 DIAGNOSIS — M25512 Pain in left shoulder: Secondary | ICD-10-CM | POA: Diagnosis not present

## 2019-02-17 DIAGNOSIS — M542 Cervicalgia: Secondary | ICD-10-CM | POA: Diagnosis not present

## 2019-02-17 DIAGNOSIS — M2550 Pain in unspecified joint: Secondary | ICD-10-CM | POA: Diagnosis not present

## 2019-02-17 DIAGNOSIS — M7550 Bursitis of unspecified shoulder: Secondary | ICD-10-CM | POA: Diagnosis not present

## 2019-02-17 DIAGNOSIS — M545 Low back pain: Secondary | ICD-10-CM | POA: Diagnosis not present

## 2019-02-17 DIAGNOSIS — M797 Fibromyalgia: Secondary | ICD-10-CM | POA: Diagnosis not present

## 2019-02-20 ENCOUNTER — Other Ambulatory Visit: Payer: Self-pay

## 2019-02-20 ENCOUNTER — Other Ambulatory Visit (HOSPITAL_COMMUNITY)
Admission: RE | Admit: 2019-02-20 | Discharge: 2019-02-20 | Disposition: A | Discharge status: Home / Self Care | Payer: Medicare Other | Source: Ambulatory Visit | Attending: Cardiology | Admitting: Cardiology

## 2019-02-20 DIAGNOSIS — Z1159 Encounter for screening for other viral diseases: Secondary | ICD-10-CM | POA: Diagnosis present

## 2019-02-21 LAB — NOVEL CORONAVIRUS, NAA (HOSP ORDER, SEND-OUT TO REF LAB; TAT 18-24 HRS): SARS-CoV-2, NAA: NOT DETECTED

## 2019-02-23 ENCOUNTER — Ambulatory Visit (HOSPITAL_BASED_OUTPATIENT_CLINIC_OR_DEPARTMENT_OTHER): Payer: Medicare Other

## 2019-03-13 ENCOUNTER — Other Ambulatory Visit (HOSPITAL_COMMUNITY)
Admission: RE | Admit: 2019-03-13 | Discharge: 2019-03-13 | Disposition: A | Discharge status: Home / Self Care | Payer: Medicare Other | Source: Ambulatory Visit | Attending: Cardiology | Admitting: Cardiology

## 2019-03-13 DIAGNOSIS — Z1159 Encounter for screening for other viral diseases: Secondary | ICD-10-CM | POA: Insufficient documentation

## 2019-03-13 LAB — SARS CORONAVIRUS 2 (TAT 6-24 HRS): SARS Coronavirus 2: NEGATIVE

## 2019-03-16 ENCOUNTER — Ambulatory Visit (HOSPITAL_BASED_OUTPATIENT_CLINIC_OR_DEPARTMENT_OTHER): Payer: Medicare Other | Attending: Cardiology | Admitting: Cardiology

## 2019-03-16 ENCOUNTER — Other Ambulatory Visit: Payer: Self-pay

## 2019-03-16 DIAGNOSIS — G4733 Obstructive sleep apnea (adult) (pediatric): Secondary | ICD-10-CM

## 2019-03-16 DIAGNOSIS — Z7901 Long term (current) use of anticoagulants: Secondary | ICD-10-CM | POA: Diagnosis not present

## 2019-03-16 DIAGNOSIS — Z79899 Other long term (current) drug therapy: Secondary | ICD-10-CM | POA: Diagnosis not present

## 2019-03-17 ENCOUNTER — Other Ambulatory Visit: Payer: Self-pay

## 2019-03-19 NOTE — Procedures (Signed)
   Patient Name: Kathy, [name] Date: 03/16/2019 Gender: Female D.O.B: 06-28-1951 Age (years): 67 Referring Provider: Fransico Him MD, ABSM Height (inches): 78 Interpreting Physician: Fransico Him MD, ABSM Weight (lbs): 283 RPSGT: Baxter Flattery BMI: 43 MRN: [id number] Neck Size: 17.00  CLINICAL INFORMATION The patient is referred for a CPAP titration to treat sleep apnea.  SLEEP STUDY TECHNIQUE As per the AASM Manual for the Scoring of Sleep and Associated Events v2.3 (April 2016) with a hypopnea requiring 4% desaturations.  The channels recorded and monitored were frontal, central and occipital EEG, electrooculogram (EOG), submentalis EMG (chin), nasal and oral airflow, thoracic and abdominal wall motion, anterior tibialis EMG, snore microphone, electrocardiogram, and pulse oximetry. Continuous positive airway pressure (CPAP) was initiated at the beginning of the study and titrated to treat sleep-disordered breathing.  MEDICATIONS Medications self-administered by patient taken the night of the study : Viona Gilmore, FLEXERIL  TECHNICIAN COMMENTS Comments added by technician: Patient had difficulty initiating sleep. [employer] DREAM WEAR NASAL MASK WAS USED FOR THIS STUDY Comments added by scorer: N/A  RESPIRATORY PARAMETERS Optimal PAP Pressure (cm):15  AHI at Optimal Pressure (/hr):0.0 Overall Minimal O2 (%):85.0  Supine % at Optimal Pressure (%):0 Minimal O2 at Optimal Pressure (%): 90.0   SLEEP ARCHITECTURE The study was initiated at 11:15:06 PM and ended at 5:06:49 AM.  Sleep onset time was 32.4 minutes and the sleep efficiency was 57.0%. The total sleep time was 200.5 minutes.  The patient spent 7.5% of the night in stage N1 sleep, 85.3% in stage N2 sleep, 0.0% in stage N3 and 7.2% in REM.Stage REM latency was 304.0 minutes  Wake after sleep onset was 118.9. Alpha intrusion was absent. Supine sleep was 32.92%.  CARDIAC DATA The 2 lead EKG  demonstrated sinus rhythm. The mean heart rate was 68.0 beats per minute. Other EKG findings include: PVCs.  LEG MOVEMENT DATA The total Periodic Limb Movements of Sleep (PLMS) were 0. The PLMS index was 0.0. A PLMS index of <15 is considered normal in adults.  IMPRESSIONS - The optimal PAP pressure was 15 cm of water. - Central sleep apnea was not noted during this titration (CAI = 1.2/h). - Moderate oxygen desaturations were observed during this titration (min O2 = 85.0%). - No snoring was audible during this study. - PVCs were observed during this study. - Clinically significant periodic limb movements were not noted during this study. Arousals associated with PLMs were rare.  DIAGNOSIS - Obstructive Sleep Apnea (327.23 [G47.33 ICD-10])  RECOMMENDATIONS - Trial of CPAP therapy on 15 cm H2O with a Small size [employer] Nasal Mask DreamWear mask and heated humidification. - Avoid alcohol, sedatives and other CNS depressants that may worsen sleep apnea and disrupt normal sleep architecture. - Sleep hygiene should be reviewed to assess factors that may improve sleep quality. - Weight management and regular exercise should be initiated or continued. - Return to Sleep Center for re-evaluation after 10 weeks of therapy  [Electronically signed] 03/19/2019 07:39 PM  Fransico Him MD, ABSM Diplomate, American Board of Sleep Medicine

## 2019-03-20 ENCOUNTER — Telehealth: Payer: Self-pay | Admitting: *Deleted

## 2019-03-20 NOTE — Telephone Encounter (Signed)
Informed patient of sleep study results and patient understanding was verbalized. Patient understands her sleep study showed they had a successful PAP titration and orders are in EPIC. Please set up 10 week OV with me.  Upon patient request DME selection is [facility]. Patient understands she will be contacted by [facility] to set up her cpap. Patient understands to call if [facility] does not contact her with new setup in a timely manner. Patient understands they will be called once confirmation has been received from [facility] that they have received their new machine to schedule 10 week follow up appointment.  [facility] notified of new cpap order  Please add to airview Patient was grateful for the call and thanked me.

## 2019-03-20 NOTE — Telephone Encounter (Signed)
-----   Message from Sueanne Margarita, MD sent at 03/19/2019  7:44 PM EDT ----- Please let patient know that they had a successful PAP titration and let DME know that orders are in EPIC.  Please set up 10 week OV with me.

## 2019-03-26 ENCOUNTER — Telehealth: Payer: Self-pay | Admitting: *Deleted

## 2019-03-26 NOTE — Telephone Encounter (Signed)
Left message on machine for patient to schedule a follow up appointment.

## 2019-04-17 ENCOUNTER — Telehealth: Payer: Self-pay | Admitting: Internal Medicine

## 2019-04-17 NOTE — Telephone Encounter (Signed)
Patient is calling to change her 04/23/2019 appt to an office visit.   Thank you  CB- [phone]

## 2019-04-17 NOTE — Telephone Encounter (Deleted)
Patient is calling to change her appt

## 2019-04-17 NOTE — Telephone Encounter (Signed)
noted 

## 2019-04-23 ENCOUNTER — Ambulatory Visit: Payer: Medicare Other | Admitting: Internal Medicine

## 2019-04-29 NOTE — Telephone Encounter (Addendum)
Received her cpap today 04-29-19 Patient has a 10 week follow up appointment scheduled for 07/01/19. Patient understands she needs to keep this appointment for insurance compliance. Patient was grateful for the call and thanked me.

## 2019-05-06 ENCOUNTER — Ambulatory Visit (INDEPENDENT_AMBULATORY_CARE_PROVIDER_SITE_OTHER): Payer: Medicare Other | Admitting: Internal Medicine

## 2019-05-06 ENCOUNTER — Other Ambulatory Visit: Payer: Self-pay

## 2019-05-06 ENCOUNTER — Encounter: Payer: Self-pay | Admitting: Internal Medicine

## 2019-05-06 DIAGNOSIS — K219 Gastro-esophageal reflux disease without esophagitis: Secondary | ICD-10-CM

## 2019-05-06 DIAGNOSIS — I480 Paroxysmal atrial fibrillation: Secondary | ICD-10-CM | POA: Diagnosis not present

## 2019-05-06 DIAGNOSIS — M797 Fibromyalgia: Secondary | ICD-10-CM

## 2019-05-06 DIAGNOSIS — F419 Anxiety disorder, unspecified: Secondary | ICD-10-CM | POA: Diagnosis not present

## 2019-05-06 DIAGNOSIS — F339 Major depressive disorder, recurrent, unspecified: Secondary | ICD-10-CM

## 2019-05-06 DIAGNOSIS — R21 Rash and other nonspecific skin eruption: Secondary | ICD-10-CM

## 2019-05-06 DIAGNOSIS — C4372 Malignant melanoma of left lower limb, including hip: Secondary | ICD-10-CM

## 2019-05-06 DIAGNOSIS — I10 Essential (primary) hypertension: Secondary | ICD-10-CM | POA: Diagnosis not present

## 2019-05-06 MED ORDER — OLMESARTAN MEDOXOMIL 40 MG PO TABS: 40.0000 mg | ORAL_TABLET | Freq: Every day | ORAL | 1 refills | Status: DC

## 2019-05-06 MED ORDER — METOPROLOL SUCCINATE ER 100 MG PO TB24: 100.0000 mg | ORAL_TABLET | Freq: Every day | ORAL | 1 refills | Status: DC

## 2019-05-06 MED ORDER — OMEPRAZOLE 40 MG PO CPDR: DELAYED_RELEASE_CAPSULE | ORAL | 1 refills | Status: DC

## 2019-05-06 MED ORDER — ALPRAZOLAM 1 MG PO TABS: 1.0000 mg | ORAL_TABLET | Freq: Two times a day (BID) | ORAL | 2 refills | Status: DC

## 2019-05-06 MED ORDER — FLUOXETINE HCL 40 MG PO CAPS: 40.0000 mg | ORAL_CAPSULE | Freq: Every day | ORAL | 1 refills | Status: DC

## 2019-05-06 NOTE — Progress Notes (Signed)
Established Patient Office Visit     CC/Reason for Visit: F/u chronic conditions, medication refills.  HPI: Kathy [name] is a 68 y.o. female who is coming in today for the above mentioned reasons. Past Medical History is significant for:  hypertension, obesity, arthritis, atrial fib., FM, anxiety, melanoma of the hip and low back pain. She has been doing well since we last spoke on the phone in April. She is scheduled to see cardiology soon for her a fib. Has occasional fluttering of her chest. Rheumatology has not made any changes to her regimen. BP has been well controlled. She has been trying to loose some weight but has not been successful due to COVID-19 and staying at home. She has had some increase in anxiety due to her husband's dx of throat cancer and an alcoholic adult son moving back in. She states she is generally coping well. Needs medication refills today. She has had melanoma of her left foot removed. She is concerned that it looks redder than usual. She also has a small, pruritic rash behin her right ear.   Past Medical/Surgical History: Past Medical History:  Diagnosis Date  . Adenomatous colon polyp    2012  . Anxiety   . Anxiety and depression   . Arthritis    Osteoarthritis, Dr Maureen Ralphs  . Depression   . Diverticulosis   . Fibromyalgia    Dr Tobie Lords, Rheu  . GERD (gastroesophageal reflux disease)   . H/O hiatal hernia   . Headache(784.0)    PAST HX MILD MIGRAINES  . Heart murmur    mitral valve prolapse--no symptoms  . History of colonic polyps 08/08/2011       . HTN (hypertension)   . IBS (irritable bowel syndrome)   . Internal hemorrhoid   . Lumbar radiculopathy   . Obesity   . Syncope    idiopathic, ? related to migraines or hyperventilation. Dr Leonie Man, Neurology,  ONE TIME EPISODE AND THIS WAS YEARS AGO    Past Surgical History:  Procedure Laterality Date  . ABDOMINAL HYSTERECTOMY     dysfunctional menses  . APPENDECTOMY    .  BREAST EXCISIONAL BIOPSY Right   . CARDIAC CATHETERIZATION  06/26/2018  . CHOLECYSTECTOMY    . COLONOSCOPY  05/27/2002, 08/08/11   2003 diverticulosis, hemorrhoids 2012 same + cecal polyp  . KNEE ARTHROSCOPY  2008   right  . LEFT HEART CATH AND CORONARY ANGIOGRAPHY N/A 06/26/2018   Procedure: LEFT HEART CATH AND CORONARY ANGIOGRAPHY;  Surgeon: Wellington Hampshire, MD;  Location: [facility];  Service: Cardiovascular;  Laterality: N/A;  . MELANOMA EXCISION Left 07/2013   foot  . TOTAL HIP ARTHROPLASTY  02/2010   left ; Dr Maureen Ralphs  . TOTAL HIP ARTHROPLASTY  03/18/2012   Procedure: TOTAL HIP ARTHROPLASTY;  Surgeon: Gearlean Alf, MD;  Location: [facility];  Service: Orthopedics;  Laterality: Right;  . UPPER GASTROINTESTINAL ENDOSCOPY  04/21/2009, 08/08/11   2010 gastritis ;2012 small hiatal hernia. Dr Carlean Purl    Social History:  reports that she has never smoked. She has never used smokeless tobacco. She reports current alcohol use. She reports that she does not use drugs.  Allergies: Allergies  Allergen Reactions  . Achromycin [Tetracycline Hcl] Shortness Of Breath    Dyspnea    . Penicillins     Facial angioedema Did it involve swelling of the face/tongue/throat, SOB, or low BP? Yes Did it involve sudden or severe rash/hives, skin peeling, or  any reaction on the inside of your mouth or nose? No Did you need to seek medical attention at a hospital or doctor's office? No When did it last happen?30+ years If all above answers are "NO", may proceed with cephalosporin use.   . Sulfamethoxazole-Trimethoprim Hives  . Nickel Itching  . Zanaflex [Tizanidine Hydrochloride]     Causes more pain     Family History:  Family History  Problem Relation Age of Onset  . Heart attack Mother 71  . COPD Mother   . Breast cancer Mother 22  . Hypertension Father   . Diabetes Father   . Heart failure Father 70  . Breast cancer Paternal Aunt        cns mets  . COPD Maternal Grandmother    . Heart disease Paternal Grandmother        died 16  . Diabetes Paternal Grandmother   . Stroke Paternal Grandfather        in late 9s  . Heart attack Brother 42  . Colon cancer Neg Hx      Current Outpatient Medications:  .  albuterol (PROVENTIL HFA;VENTOLIN HFA) 108 (90 Base) MCG/ACT inhaler, Inhale 2 puffs into the lungs every 6 (six) hours as needed for wheezing or shortness of breath., Disp: 1 Inhaler, Rfl: 2 .  ALPRAZolam (XANAX) 1 MG tablet, Take 1 tablet (1 mg total) by mouth 2 (two) times daily., Disp: 60 tablet, Rfl: 2 .  apixaban (ELIQUIS) 5 MG TABS tablet, Take 1 tablet (5 mg total) by mouth 2 (two) times daily., Disp: 180 tablet, Rfl: 3 .  atorvastatin (LIPITOR) 40 MG tablet, Take 1 tablet (40 mg total) by mouth daily at 6 PM., Disp: 90 tablet, Rfl: 3 .  CALCIUM PO, Take 1,000 mg by mouth daily. , Disp: , Rfl:  .  clobetasol (TEMOVATE) 0.05 % external solution, Apply 1 application topically daily as needed (rosacea)., Disp: , Rfl:  .  cyclobenzaprine (FLEXERIL) 10 MG tablet, Take 1 tablet (10 mg total) by mouth 4 (four) times daily as needed. SPASMS, Disp: 30 tablet, Rfl: 5 .  famotidine-calcium carbonate-magnesium hydroxide (PEPCID COMPLETE) 10-800-165 MG CHEW chewable tablet, 1 -2 tablets as needed to prevent or treat heartburn, Disp: , Rfl:  .  fluocinonide (LIDEX) 0.05 % external solution, Apply 1 application topically daily as needed (rosacea)., Disp: , Rfl:  .  FLUoxetine (PROZAC) 40 MG capsule, Take 1 capsule (40 mg total) by mouth daily., Disp: 90 capsule, Rfl: 1 .  fluticasone (FLONASE) 50 MCG/ACT nasal spray, Place 2 sprays into both nostrils daily., Disp: 16 g, Rfl: 6 .  furosemide (LASIX) 40 MG tablet, Take 40 mg every other day alternating with 20 mg tablets, Disp: 60 tablet, Rfl: 3 .  HYDROcodone-acetaminophen (NORCO/VICODIN) 5-325 MG tablet, Take 1 tablet by mouth every 6 (six) hours as needed for moderate pain. , Disp: , Rfl:  .  metoprolol succinate (TOPROL-XL)  100 MG 24 hr tablet, Take 1 tablet (100 mg total) by mouth daily., Disp: 90 tablet, Rfl: 1 .  Multiple Vitamin (MULTIVITAMIN) tablet, Take 1 tablet by mouth daily., Disp: , Rfl:  .  olmesartan (BENICAR) 40 MG tablet, Take 1 tablet (40 mg total) by mouth daily., Disp: 90 tablet, Rfl: 1 .  omeprazole (PRILOSEC) 40 MG capsule, TAKE 1 CAPSULE BY MOUTH EVERY DAY, Disp: 90 capsule, Rfl: 1 .  potassium chloride (K-DUR) 10 MEQ tablet, Take 1 tablet (10 mEq total) by mouth daily., Disp: 90 tablet, Rfl: 3  Review  of Systems:  Constitutional: Denies fever, chills, diaphoresis, appetite change and fatigue.  HEENT: Denies photophobia, eye pain, redness, hearing loss, ear pain, congestion, sore throat, rhinorrhea, sneezing, mouth sores, trouble swallowing, neck pain, neck stiffness and tinnitus.   Respiratory: Denies SOB, DOE, cough, chest tightness,  and wheezing.   Cardiovascular: Denies chest pain, palpitations and leg swelling.  Gastrointestinal: Denies nausea, vomiting, abdominal pain, diarrhea, constipation, blood in stool and abdominal distention.  Genitourinary: Denies dysuria, urgency, frequency, hematuria, flank pain and difficulty urinating.  Endocrine: Denies: hot or cold intolerance, sweats, changes in hair or nails, polyuria, polydipsia. Musculoskeletal: Denies myalgias, back pain, joint swelling, arthralgias and gait problem.  Skin: Denies pallor, rash and wound.  Neurological: Denies dizziness, seizures, syncope, weakness, light-headedness, numbness and headaches.  Hematological: Denies adenopathy. Easy bruising, personal or family bleeding history  Psychiatric/Behavioral: Denies suicidal ideation, mood changes, confusion, nervousness, sleep disturbance and agitation    Physical Exam: Vitals:   05/06/19 1138  BP: 120/80  Pulse: 76  Temp: 98 F (36.7 C)  TempSrc: Temporal  SpO2: 96%  Weight: 294 lb 1.6 oz (133.4 kg)    Body mass index is 44.72 kg/m.   Constitutional: NAD, calm,  comfortable Eyes: PERRL, lids and conjunctivae normal, wears corrective lenses. ENMT: Mucous membranes are moist. Respiratory: clear to auscultation bilaterally, no wheezing, no crackles. Normal respiratory effort. No accessory muscle use.  Cardiovascular: Regular rate and rhythm, no murmurs / rubs / gallops. No extremity edema. 2+ pedal pulses. No carotid bruits.  Abdomen: no tenderness, no masses palpated. No hepatosplenomegaly. Bowel sounds positive.  Musculoskeletal: no clubbing / cyanosis. No joint deformity upper and lower extremities. Good ROM, no contractures. Normal muscle tone.  Skin:  Small, circumferential lesion over dorsum of left foot, appears a little red in the center.  Neurologic: CN 2-12 grossly intact. Sensation intact, DTR normal. Strength 5/5 in all 4.  Psychiatric: Normal judgment and insight. Alert and oriented x 3. Normal mood.    Impression and Plan:  Morbid obesity ([place]) -Discussed healthy lifestyle, including increased physical activity and better food choices to promote weight loss.  Anxiety/Depression -PDMP reviewed, no red flags. -Refill Xanax x 3 months (60 tabs per month). I have tapered her down from QID to BID. -Continue Fluoxetine.    Office Visit from 05/06/2019 in [place] at [facility]  PHQ-9 Total Score  7       Paroxysmal atrial fibrillation ([place]) -Rate controlled, anticoagulated on Eliquis. -Is in NSR today.  Essential hypertension  -Well controlled on current meds.  Gastroesophageal reflux disease without esophagitis  -Well controlled on PPI.  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 -Does not appear infected today.  Fibromyalgia -F/u with rheum as scheduled. No recent med changes.  Rash behind right ear. 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 -Advised steroid cream PRN (she thinks she has some at home).       Lelon Frohlich, MD [facility] at [facility]

## 2019-06-16 NOTE — Progress Notes (Signed)
Cardiology Office Note:    Date:  06/17/2019   ID:  Kathy [name], DOB 1951/05/04, MRN [id number]  PCP:  Isaac Bliss, Rayford Halsted, MD  Cardiologist:  Mertie Moores, MD  Electrophysiologist:  None   Referring MD: Isaac Bliss, [place]*   Problem List 1.  Chronic diastolic CHF 2. Morbid obesity 3. Severe pulmonary HTN 4. Mild CAD  5.   Chief Complaint  Patient presents with  . Congestive Heart Failure  . Atrial Fibrillation        Kathy [name] is a 68 y.o. female with a hx of  MVP, chest pain  Hyperlipidemia and atrial fib We are asked to see her by Isaac Bliss, Rayford Halsted, MD for further eval of her atrial fib  I have seen her many years ago .   Developed CP,  Called EMS ECG at home showed atrial fib  She was admitted to the hospital with mid sternal chest pain , atrial fib, HTN.  Troponin levels rose to 4.96.    cath showed mild irreg. CT angiogram of the chest showed no aortic dissection  Echo shows  Normal LV systolic function - severe concnetric LCH Grade 2 diastolic dysfunction   Severe pulmonary HTN with estimated PA pressure of 84.   Gained 16 lbs after her hospitalization Was started on Lasix by Dr. Sharlene Motts  BP is now under control.  Breathing seems to be better Does not do any exercise ( limited by orthopedic issues )    Feb. 25, 2020: Kathy [name] is seen back today for follow-up of her chronic diastolic congestive heart failure, paroxysmal atrial fibrillation and pulmonary hypertension.  Takes her BP and HR daily.   Is in atrial flutter today   Has been more short of breath for the past several days . Has started working part time.   Has had her 1st part of her sleep study .   Needs to go back for her CPAP titration  Is working on weight loss.  Still fluctuating .   Sept, 29, 2020 Kathy [name] is seen for follow up of her chronic diastolic CHF, severe pulmonary HTN, morbid obesity, Wt today is 295  ( up 9 lbs from last visit 6 months )   Has a bump  on the back of her head   Breathing has been ok  Has not been exercising . And is drinking coke.    Is under some stress.   Husband has throat cancer.    Past Medical History:  Diagnosis Date  . Adenomatous colon polyp    2012  . Anxiety   . Anxiety and depression   . Arthritis    Osteoarthritis, Dr Maureen Ralphs  . Depression   . Diverticulosis   . Fibromyalgia    Dr Tobie Lords, Rheu  . GERD (gastroesophageal reflux disease)   . H/O hiatal hernia   . Headache(784.0)    PAST HX MILD MIGRAINES  . Heart murmur    mitral valve prolapse--no symptoms  . History of colonic polyps 08/08/2011       . HTN (hypertension)   . IBS (irritable bowel syndrome)   . Internal hemorrhoid   . Lumbar radiculopathy   . Obesity   . Syncope    idiopathic, ? related to migraines or hyperventilation. Dr Leonie Man, Neurology,  ONE TIME EPISODE AND THIS WAS YEARS AGO    Past Surgical History:  Procedure Laterality Date  . ABDOMINAL HYSTERECTOMY     dysfunctional menses  . APPENDECTOMY    .  BREAST EXCISIONAL BIOPSY Right   . CARDIAC CATHETERIZATION  06/26/2018  . CHOLECYSTECTOMY    . COLONOSCOPY  05/27/2002, 08/08/11   2003 diverticulosis, hemorrhoids 2012 same + cecal polyp  . KNEE ARTHROSCOPY  2008   right  . LEFT HEART CATH AND CORONARY ANGIOGRAPHY N/A 06/26/2018   Procedure: LEFT HEART CATH AND CORONARY ANGIOGRAPHY;  Surgeon: Wellington Hampshire, MD;  Location: [facility];  Service: Cardiovascular;  Laterality: N/A;  . MELANOMA EXCISION Left 07/2013   foot  . TOTAL HIP ARTHROPLASTY  02/2010   left ; Dr Maureen Ralphs  . TOTAL HIP ARTHROPLASTY  03/18/2012   Procedure: TOTAL HIP ARTHROPLASTY;  Surgeon: Gearlean Alf, MD;  Location: [facility];  Service: Orthopedics;  Laterality: Right;  . UPPER GASTROINTESTINAL ENDOSCOPY  04/21/2009, 08/08/11   2010 gastritis ;2012 small hiatal hernia. Dr Carlean Purl    Current Medications: Current Meds  Medication Sig  . albuterol (PROVENTIL HFA;VENTOLIN  HFA) 108 (90 Base) MCG/ACT inhaler Inhale 2 puffs into the lungs every 6 (six) hours as needed for wheezing or shortness of breath.  . ALPRAZolam (XANAX) 1 MG tablet Take 1 tablet (1 mg total) by mouth 2 (two) times daily.  Marland Kitchen apixaban (ELIQUIS) 5 MG TABS tablet Take 1 tablet (5 mg total) by mouth 2 (two) times daily.  Marland Kitchen atorvastatin (LIPITOR) 40 MG tablet Take 1 tablet (40 mg total) by mouth daily at 6 PM.  . CALCIUM PO Take 1,000 mg by mouth daily.   . clobetasol (TEMOVATE) 0.05 % external solution Apply 1 application topically daily as needed (rosacea).  . cyclobenzaprine (FLEXERIL) 10 MG tablet Take 1 tablet (10 mg total) by mouth 4 (four) times daily as needed. SPASMS  . famotidine-calcium carbonate-magnesium hydroxide (PEPCID COMPLETE) 10-800-165 MG CHEW chewable tablet 1 -2 tablets as needed to prevent or treat heartburn  . fluocinonide (LIDEX) 0.05 % external solution Apply 1 application topically daily as needed (rosacea).  Marland Kitchen FLUoxetine (PROZAC) 40 MG capsule Take 1 capsule (40 mg total) by mouth daily.  . furosemide (LASIX) 40 MG tablet Take 40 mg every other day alternating with 20 mg tablets  . HYDROcodone-acetaminophen (NORCO/VICODIN) 5-325 MG tablet Take 1 tablet by mouth every 6 (six) hours as needed for moderate pain.   . metoprolol succinate (TOPROL-XL) 100 MG 24 hr tablet Take 1 tablet (100 mg total) by mouth daily.  . Multiple Vitamin (MULTIVITAMIN) tablet Take 1 tablet by mouth daily.  Marland Kitchen olmesartan (BENICAR) 40 MG tablet Take 1 tablet (40 mg total) by mouth daily.  Marland Kitchen omeprazole (PRILOSEC) 40 MG capsule TAKE 1 CAPSULE BY MOUTH EVERY DAY  . potassium chloride (K-DUR) 10 MEQ tablet Take 1 tablet (10 mEq total) by mouth daily.     Allergies:   Achromycin [tetracycline hcl], Penicillins, Sulfamethoxazole-trimethoprim, Nickel, and Zanaflex [tizanidine hydrochloride]   Social History   Socioeconomic History  . Marital status: Married    Spouse name: Not on file  . Number of  children: Not on file  . Years of education: Not on file  . Highest education level: Not on file  Occupational History  . Not on file  Social Needs  . Financial resource strain: Not on file  . Food insecurity    Worry: Not on file    Inability: Not on file  . Transportation needs    Medical: Not on file    Non-medical: Not on file  Tobacco Use  . Smoking status: Never Smoker  . Smokeless tobacco: Never Used  Substance and Sexual Activity  . Alcohol use: Yes    Comment: occasional  . Drug use: No  . Sexual activity: Not on file  Lifestyle  . Physical activity    Days per week: Not on file    Minutes per session: Not on file  . Stress: Not on file  Relationships  . Social Herbalist on phone: Not on file    Gets together: Not on file    Attends religious service: Not on file    Active member of club or organization: Not on file    Attends meetings of clubs or organizations: Not on file    Relationship status: Not on file  Other Topics Concern  . Not on file  Social History Narrative   0 caffeine drinks      Family History: The patient's family history includes Breast cancer in her paternal aunt; Breast cancer (age of onset: 23) in her mother; COPD in her maternal grandmother and mother; Diabetes in her father and paternal grandmother; Heart attack (age of onset: 41) in her brother; Heart attack (age of onset: 40) in her mother; Heart disease in her paternal grandmother; Heart failure (age of onset: 36) in her father; Hypertension in her father; Stroke in her paternal grandfather. There is no history of Colon cancer.  ROS:   Please see the history of present illness.     All other systems reviewed and are negative.  EKGs/Labs/Other Studies Reviewed:    The following studies were reviewed today:    Recent Labs: 06/25/2018: ALT 43; B Natriuretic Peptide 620.5 06/26/2018: Magnesium 1.9; TSH 1.782 11/12/2018: BUN 13; Creatinine, Ser 0.87; Hemoglobin 14.2;  Platelets 218; Potassium 3.6; Sodium 143  Recent Lipid Panel    Component Value Date/Time   CHOL 235 (H) 04/30/2018 1134   TRIG 169.0 (H) 04/30/2018 1134   HDL 54.70 04/30/2018 1134   CHOLHDL 4 04/30/2018 1134   VLDL 33.8 04/30/2018 1134   LDLCALC 146 (H) 04/30/2018 1134   LDLDIRECT 143.2 01/22/2013 0856    Physical Exam: Blood pressure (!) 126/94, pulse 73, height 5\' 8"  (1.727 m), weight 295 lb 3.2 oz (133.9 kg), SpO2 95 %.  GEN:  Middle age, morbidly obese female,  NAD  HEENT: Normal NECK: No JVD; No carotid bruits LYMPHATICS: No lymphadenopathy CARDIAC: RRR , no murmurs, rubs, gallops RESPIRATORY:  Clear to auscultation without rales, wheezing or rhonchi  ABDOMEN: Soft, non-tender, non-distended MUSCULOSKELETAL:  No edema; No deformity  SKIN: Warm and dry NEUROLOGIC:  Alert and oriented x 3    EKG:   Marland Kitchen  ASSESSMENT:    1. Chronic diastolic CHF (congestive heart failure) (HCC)    PLAN:       Chronic diastolic congestive heart failure:  Seems to be doing well.   Lungs are clear . Is avoiding salty foods   2.  Pulmonary hypertension:  How has CPAP for her OSA.   3.  Paroxysmal atrial fibrillation:    Is maintaining NSR   4.  Coronary artery disease: no angina   5.  Morbid obesity :   Has gained 9 lbs since last visit . Still drinking cokes.   Encouraged her to avoid sweeks.     Medication Adjustments/Labs and Tests Ordered: Current medicines are reviewed at length with the patient today.  Concerns regarding medicines are outlined above.  No orders of the defined types were placed in this encounter.  No orders of the defined types were placed in this  encounter.   Patient Instructions  Medication Instructions:  Your provider recommends that you continue on your current medications as directed. Please refer to the Current Medication list given to you today.    Labwork: None  Testing/Procedures: None  Follow-Up: Your provider wants you to follow-up in:  1 year with Dr. Acie Fredrickson. You will receive a reminder letter in the mail two months in advance. If you don't receive a letter, please call our office to schedule the follow-up appointment.    Any Other Special Instructions Will Be Listed Below (If Applicable).     If you need a refill on your cardiac medications before your next appointment, please call your pharmacy.      Signed, Mertie Moores, MD  06/17/2019 3:29 PM    [place]

## 2019-06-17 ENCOUNTER — Ambulatory Visit (INDEPENDENT_AMBULATORY_CARE_PROVIDER_SITE_OTHER): Payer: Medicare Other | Admitting: Cardiovascular Disease

## 2019-06-17 ENCOUNTER — Encounter: Payer: Self-pay | Admitting: Cardiovascular Disease

## 2019-06-17 ENCOUNTER — Other Ambulatory Visit: Payer: Self-pay

## 2019-06-17 VITALS — BP 126/94 | HR 73 | Ht 68.0 in | Wt 295.2 lb

## 2019-06-17 DIAGNOSIS — I10 Essential (primary) hypertension: Secondary | ICD-10-CM

## 2019-06-17 DIAGNOSIS — I480 Paroxysmal atrial fibrillation: Secondary | ICD-10-CM

## 2019-06-17 DIAGNOSIS — I5032 Chronic diastolic (congestive) heart failure: Secondary | ICD-10-CM

## 2019-06-17 NOTE — Patient Instructions (Signed)
Medication Instructions:  Your provider recommends that you continue on your current medications as directed. Please refer to the Current Medication list given to you today.    Labwork: None  Testing/Procedures: None  Follow-Up: Your provider wants you to follow-up in: 1 year with Dr. Acie Fredrickson. You will receive a reminder letter in the mail two months in advance. If you don't receive a letter, please call our office to schedule the follow-up appointment.    Any Other Special Instructions Will Be Listed Below (If Applicable).     If you need a refill on your cardiac medications before your next appointment, please call your pharmacy.

## 2019-06-30 ENCOUNTER — Encounter: Payer: Self-pay | Admitting: *Deleted

## 2019-06-30 DIAGNOSIS — H2513 Age-related nuclear cataract, bilateral: Secondary | ICD-10-CM | POA: Diagnosis not present

## 2019-07-01 ENCOUNTER — Encounter: Payer: Self-pay | Admitting: Cardiology

## 2019-07-01 ENCOUNTER — Telehealth (INDEPENDENT_AMBULATORY_CARE_PROVIDER_SITE_OTHER): Payer: Medicare Other | Admitting: Cardiology

## 2019-07-01 ENCOUNTER — Telehealth: Payer: Medicare Other | Admitting: Cardiology

## 2019-07-01 ENCOUNTER — Other Ambulatory Visit: Payer: Self-pay

## 2019-07-01 VITALS — BP 130/58 | HR 70 | Ht 68.0 in | Wt 294.0 lb

## 2019-07-01 DIAGNOSIS — G4733 Obstructive sleep apnea (adult) (pediatric): Secondary | ICD-10-CM | POA: Diagnosis not present

## 2019-07-01 DIAGNOSIS — Z9989 Dependence on other enabling machines and devices: Secondary | ICD-10-CM

## 2019-07-01 DIAGNOSIS — I10 Essential (primary) hypertension: Secondary | ICD-10-CM

## 2019-07-01 NOTE — Patient Instructions (Signed)
Medication Instructions:  No changes If you need a refill on your cardiac medications before your next appointment, please call your pharmacy.   Lab work: none If you have labs (blood work) drawn today and your tests are completely normal, you will receive your results only by: Marland Kitchen MyChart Message (if you have MyChart) OR . A paper copy in the mail If you have any lab test that is abnormal or we need to change your treatment, we will call you to review the results.  Testing/Procedures: none  Follow-Up: Year with Dr. Radford Pax  Any Other Special Instructions Will Be Listed Below (If Applicable).

## 2019-07-01 NOTE — Progress Notes (Signed)
Virtual Visit via Telephone  Note   This visit type was conducted due to national recommendations for restrictions regarding the COVID-19 Pandemic (e.g. social distancing) in an effort to limit this patient's exposure and mitigate transmission in our community.  Due to her co-morbid illnesses, this patient is at least at moderate risk for complications without adequate follow up.  This format is felt to be most appropriate for this patient at this time.  All issues noted in this document were discussed and addressed.  A limited physical exam was performed with this format.  Please refer to the patient's chart for her consent to telehealth for [facility].   Evaluation Performed:  Follow-up visit  This visit type was conducted due to national recommendations for restrictions regarding the COVID-19 Pandemic (e.g. social distancing).  This format is felt to be most appropriate for this patient at this time.  All issues noted in this document were discussed and addressed.  No physical exam was performed (except for noted visual exam findings with Video Visits).  Please refer to the patient's chart (MyChart message for video visits and phone note for telephone visits) for the patient's consent to telehealth for [facility].  Date:  07/01/2019   ID:  Kathy [name], DOB 07-07-1951, MRN [id number]  Patient Location:  Home  Provider location:   [place]  PCP:  Isaac Bliss, Rayford Halsted, MD  Cardiologist:  Mertie Moores, MD  Sleep Medicine: Fransico Him, MD Electrophysiologist:  None   Chief Complaint:  OSA  History of Present Illness:    Kathy [name] is a 68 y.o. female who presents via audio/video conferencing for a telehealth visit today.    This is a 68yo female with a hx of HTN, Obesity, PAF, CHF, Snoring and excessive daytime fatigue who was referred by Dr. Acie Fredrickson for sleep study which showed moderate OSA with an AHI of 20/hr and O2 desats as low as 79%.  She underwent  CPAP titration to 15cm H2O.   She is doing well with her CPAP device and thinks that she has gotten used to it.  She tolerates the mask and feels the pressure is adequate.  Since going on CPAP she feels rested in the am and has no significant daytime sleepiness.  She has some nasal congestion from allergies and occasional dry mouth.  She does not think that he snores.    The patient does not have symptoms concerning for COVID-19 infection (fever, chills, cough, or new shortness of breath).   Prior CV studies:   The following studies were reviewed today:  Sleep study and CPAP titration, PAP compliance download  Past Medical History:  Diagnosis Date  . Adenomatous colon polyp    2012  . Anxiety   . Anxiety and depression   . Arthritis    Osteoarthritis, Dr Maureen Ralphs  . Depression   . Diverticulosis   . Fibromyalgia    Dr Tobie Lords, Rheu  . GERD (gastroesophageal reflux disease)   . H/O hiatal hernia   . Headache(784.0)    PAST HX MILD MIGRAINES  . Heart murmur    mitral valve prolapse--no symptoms  . History of colonic polyps 08/08/2011       . HTN (hypertension)   . IBS (irritable bowel syndrome)   . Internal hemorrhoid   . Lumbar radiculopathy   . Obesity   . OSA on CPAP    Moderate with AHI 20/hr with nocturnal hypoxemia on CPAP  . Syncope  idiopathic, ? related to migraines or hyperventilation. Dr Leonie Man, Neurology,  ONE TIME EPISODE AND THIS WAS YEARS AGO   Past Surgical History:  Procedure Laterality Date  . ABDOMINAL HYSTERECTOMY     dysfunctional menses  . APPENDECTOMY    . BREAST EXCISIONAL BIOPSY Right   . CARDIAC CATHETERIZATION  06/26/2018  . CHOLECYSTECTOMY    . COLONOSCOPY  05/27/2002, 08/08/11   2003 diverticulosis, hemorrhoids 2012 same + cecal polyp  . KNEE ARTHROSCOPY  2008   right  . LEFT HEART CATH AND CORONARY ANGIOGRAPHY N/A 06/26/2018   Procedure: LEFT HEART CATH AND CORONARY ANGIOGRAPHY;  Surgeon: [place] Hampshire, MD;  Location: [facility];  Service: Cardiovascular;  Laterality: N/A;  . MELANOMA EXCISION Left 07/2013   foot  . TOTAL HIP ARTHROPLASTY  02/2010   left ; Dr Maureen Ralphs  . TOTAL HIP ARTHROPLASTY  03/18/2012   Procedure: TOTAL HIP ARTHROPLASTY;  Surgeon: Gearlean Alf, MD;  Location: [facility];  Service: Orthopedics;  Laterality: Right;  . UPPER GASTROINTESTINAL ENDOSCOPY  04/21/2009, 08/08/11   2010 gastritis ;2012 small hiatal hernia. Dr Carlean Purl     Current Meds  Medication Sig  . albuterol (PROVENTIL HFA;VENTOLIN HFA) 108 (90 Base) MCG/ACT inhaler Inhale 2 puffs into the lungs every 6 (six) hours as needed for wheezing or shortness of breath.  . ALPRAZolam (XANAX) 1 MG tablet Take 1 tablet (1 mg total) by mouth 2 (two) times daily.  Marland Kitchen apixaban (ELIQUIS) 5 MG TABS tablet Take 1 tablet (5 mg total) by mouth 2 (two) times daily.  Marland Kitchen atorvastatin (LIPITOR) 40 MG tablet Take 1 tablet (40 mg total) by mouth daily at 6 PM.  . CALCIUM PO Take 1,000 mg by mouth daily.   . clobetasol (TEMOVATE) 0.05 % external solution Apply 1 application topically daily as needed (rosacea).  . cyclobenzaprine (FLEXERIL) 10 MG tablet Take 1 tablet (10 mg total) by mouth 4 (four) times daily as needed. SPASMS  . famotidine-calcium carbonate-magnesium hydroxide (PEPCID COMPLETE) 10-800-165 MG CHEW chewable tablet 1 -2 tablets as needed to prevent or treat heartburn  . fluocinonide (LIDEX) 0.05 % external solution Apply 1 application topically daily as needed (rosacea).  Marland Kitchen FLUoxetine (PROZAC) 40 MG capsule Take 1 capsule (40 mg total) by mouth daily.  . furosemide (LASIX) 40 MG tablet Take 40 mg every other day alternating with 20 mg tablets  . HYDROcodone-acetaminophen (NORCO/VICODIN) 5-325 MG tablet Take 1 tablet by mouth every 6 (six) hours as needed for moderate pain.   . metoprolol succinate (TOPROL-XL) 100 MG 24 hr tablet Take 1 tablet (100 mg total) by mouth daily.  . Multiple Vitamin (MULTIVITAMIN) tablet Take 1 tablet by mouth daily.   Marland Kitchen olmesartan (BENICAR) 40 MG tablet Take 1 tablet (40 mg total) by mouth daily.  Marland Kitchen omeprazole (PRILOSEC) 40 MG capsule TAKE 1 CAPSULE BY MOUTH EVERY DAY  . potassium chloride (K-DUR) 10 MEQ tablet Take 1 tablet (10 mEq total) by mouth daily.     Allergies:   Achromycin [tetracycline hcl], Penicillins, Sulfamethoxazole-trimethoprim, Nickel, and Zanaflex [tizanidine hydrochloride]   Social History   Tobacco Use  . Smoking status: Never Smoker  . Smokeless tobacco: Never Used  Substance Use Topics  . Alcohol use: Yes    Comment: occasional  . Drug use: No     Family Hx: The patient's family history includes Breast cancer in her paternal aunt; Breast cancer (age of onset: 59) in her mother; COPD in her maternal grandmother and mother;  Diabetes in her father and paternal grandmother; Heart attack (age of onset: 74) in her brother; Heart attack (age of onset: 24) in her mother; Heart disease in her paternal grandmother; Heart failure (age of onset: 70) in her father; Hypertension in her father; Stroke in her paternal grandfather. There is no history of Colon cancer.  ROS:   Please see the history of present illness.     All other systems reviewed and are negative.   Labs/Other Tests and Data Reviewed:    Recent Labs: 11/12/2018: BUN 13; Creatinine, Ser 0.87; Hemoglobin 14.2; Platelets 218; Potassium 3.6; Sodium 143   Recent Lipid Panel Lab Results  Component Value Date/Time   CHOL 235 (H) 04/30/2018 11:34 AM   TRIG 169.0 (H) 04/30/2018 11:34 AM   HDL 54.70 04/30/2018 11:34 AM   CHOLHDL 4 04/30/2018 11:34 AM   LDLCALC 146 (H) 04/30/2018 11:34 AM   LDLDIRECT 143.2 01/22/2013 08:56 AM    Wt Readings from Last 3 Encounters:  07/01/19 294 lb (133.4 kg)  06/17/19 295 lb 3.2 oz (133.9 kg)  05/06/19 294 lb 1.6 oz (133.4 kg)     Objective:    Vital Signs:  BP (!) 130/58   Pulse 70   Ht 5\' 8"  (1.727 m)   Wt 294 lb (133.4 kg)   BMI 44.70 kg/m    ASSESSMENT & PLAN:    1.   OSA - The pathophysiology of obstructive sleep apnea , it's cardiovascular consequences & modes of treatment including CPAP were discused with the patient in detail & they evidenced understanding.  The patient is tolerating PAP therapy well without any problems. The PAP download was reviewed today and showed an AHI of 2.3/hr on 15 cm H2O with 83% compliance in using more than 4 hours nightly.  The patient has been using and benefiting from PAP use and will continue to benefit from therapy. I encouraged her to use nasal saline spray twice daily.   2.  HTN -BP controlled -continue Toprol XL 100mg  daily and Olmesartan 40mg  daily  3.  Morbid Obesity -I have encouraged him to get into a routine exercise program and cut back on carbs and portions.   COVID-19 Education: The signs and symptoms of COVID-19 were discussed with the patient and how to seek care for testing (follow up with PCP or arrange E-visit).  The importance of social distancing was discussed today.  Patient Risk:   After full review of this patient's clinical status, I feel that they are at least moderate risk at this time.  Time:   Today, I have spent 20 minutes directly with the patient on telemedicine discussing medical problems including OSA, HTN, obesity.  We also reviewed the symptoms of COVID 19 and the ways to protect against contracting the virus with telehealth technology.  I spent an additional 5 minutes reviewing patient's chart including sleep study, CPAP titration, PAP compliance download.  Medication Adjustments/Labs and Tests Ordered: Current medicines are reviewed at length with the patient today.  Concerns regarding medicines are outlined above.  Tests Ordered: No orders of the defined types were placed in this encounter.  Medication Changes: No orders of the defined types were placed in this encounter.   Disposition:  Follow up in 1 year(s)  Signed, Fransico Him, MD  07/01/2019 3:25 PM    [place]

## 2019-07-04 NOTE — Telephone Encounter (Signed)
This encounter was created in error - please disregard.

## 2019-07-18 ENCOUNTER — Other Ambulatory Visit: Payer: Self-pay

## 2019-07-18 DIAGNOSIS — Z20822 Contact with and (suspected) exposure to covid-19: Secondary | ICD-10-CM

## 2019-07-20 LAB — NOVEL CORONAVIRUS, NAA: SARS-CoV-2, NAA: NOT DETECTED

## 2019-08-18 ENCOUNTER — Other Ambulatory Visit: Payer: Self-pay | Admitting: Internal Medicine

## 2019-08-18 DIAGNOSIS — I10 Essential (primary) hypertension: Secondary | ICD-10-CM

## 2019-08-18 DIAGNOSIS — I480 Paroxysmal atrial fibrillation: Secondary | ICD-10-CM

## 2019-08-18 DIAGNOSIS — F419 Anxiety disorder, unspecified: Secondary | ICD-10-CM

## 2019-08-20 ENCOUNTER — Other Ambulatory Visit: Payer: Self-pay | Admitting: Internal Medicine

## 2019-08-20 DIAGNOSIS — F419 Anxiety disorder, unspecified: Secondary | ICD-10-CM

## 2019-08-20 NOTE — Telephone Encounter (Signed)
Pt called to check status, she is out as of tonight 08/20/2019

## 2019-08-22 NOTE — Telephone Encounter (Signed)
Last filled 08/20/2019 Last OV 05/06/2019

## 2019-10-08 ENCOUNTER — Telehealth: Payer: Self-pay | Admitting: *Deleted

## 2019-10-08 ENCOUNTER — Other Ambulatory Visit: Payer: Self-pay | Admitting: Internal Medicine

## 2019-10-08 DIAGNOSIS — Z1231 Encounter for screening mammogram for malignant neoplasm of breast: Secondary | ICD-10-CM

## 2019-10-08 NOTE — Addendum Note (Signed)
Addended by: Westley Hummer B on: 10/08/2019 04:18 PM   Modules accepted: Orders

## 2019-10-08 NOTE — Telephone Encounter (Signed)
Order placed

## 2019-10-08 NOTE — Telephone Encounter (Signed)
Copied from [place] [id number]. Topic: Quick Communication - See Telephone Encounter >> Oct 08, 2019  3:58 PM [place] wrote: CRM for notification. See Telephone encounter for: 10/08/19. Pt has called to sch. an appt with the [facility] for her mammogram and the [facility] wants an order forwarded. Pt states that Dr Lemmie Evens knows all her breast concerns and that she needs her mammo's

## 2019-11-14 ENCOUNTER — Telehealth: Payer: Self-pay | Admitting: Internal Medicine

## 2019-11-14 DIAGNOSIS — N644 Mastodynia: Secondary | ICD-10-CM

## 2019-11-14 NOTE — Telephone Encounter (Signed)
Orders have been fixed.

## 2019-11-14 NOTE — Telephone Encounter (Signed)
[facility] is calling stating that they need to have the orders changed to reflect that the pt need to have a Diagnostic Bilateral and U/S for her right breast around her nipple area due to pain.

## 2019-11-18 ENCOUNTER — Other Ambulatory Visit: Payer: Self-pay | Admitting: *Deleted

## 2019-11-18 DIAGNOSIS — F419 Anxiety disorder, unspecified: Secondary | ICD-10-CM

## 2019-11-19 MED ORDER — ALPRAZOLAM 1 MG PO TABS: 1.0000 mg | ORAL_TABLET | Freq: Two times a day (BID) | ORAL | 1 refills | Status: DC

## 2019-11-27 ENCOUNTER — Ambulatory Visit: Payer: Medicare Other | Admitting: Physician Assistant

## 2019-11-27 ENCOUNTER — Other Ambulatory Visit: Payer: Self-pay

## 2019-11-27 ENCOUNTER — Other Ambulatory Visit (INDEPENDENT_AMBULATORY_CARE_PROVIDER_SITE_OTHER): Payer: Medicare Other

## 2019-11-27 ENCOUNTER — Encounter: Payer: Self-pay | Admitting: Physician Assistant

## 2019-11-27 VITALS — BP 132/72 | HR 74 | Temp 98.4°F | Ht 67.5 in | Wt 297.0 lb

## 2019-11-27 DIAGNOSIS — Z8719 Personal history of other diseases of the digestive system: Secondary | ICD-10-CM

## 2019-11-27 DIAGNOSIS — K625 Hemorrhage of anus and rectum: Secondary | ICD-10-CM | POA: Diagnosis not present

## 2019-11-27 DIAGNOSIS — R1084 Generalized abdominal pain: Secondary | ICD-10-CM | POA: Diagnosis not present

## 2019-11-27 DIAGNOSIS — K648 Other hemorrhoids: Secondary | ICD-10-CM | POA: Diagnosis not present

## 2019-11-27 LAB — CBC WITH DIFFERENTIAL/PLATELET
Basophils Absolute: 0 10*3/uL (ref 0.0–0.1)
Basophils Relative: 0.3 % (ref 0.0–3.0)
Eosinophils Absolute: 0.1 10*3/uL (ref 0.0–0.7)
Eosinophils Relative: 2.1 % (ref 0.0–5.0)
HCT: 42.3 % (ref 36.0–46.0)
Hemoglobin: 14.1 g/dL (ref 12.0–15.0)
Lymphocytes Relative: 17.3 % (ref 12.0–46.0)
Lymphs Abs: 1 10*3/uL (ref 0.7–4.0)
MCHC: 33.3 g/dL (ref 30.0–36.0)
MCV: 93.9 fl (ref 78.0–100.0)
Monocytes Absolute: 0.4 10*3/uL (ref 0.1–1.0)
Monocytes Relative: 7.3 % (ref 3.0–12.0)
Neutro Abs: 4.4 10*3/uL (ref 1.4–7.7)
Neutrophils Relative %: 73 % (ref 43.0–77.0)
Platelets: 212 10*3/uL (ref 150.0–400.0)
RBC: 4.51 Mil/uL (ref 3.87–5.11)
RDW: 14.8 % (ref 11.5–15.5)
WBC: 6 10*3/uL (ref 4.0–10.5)

## 2019-11-27 LAB — COMPREHENSIVE METABOLIC PANEL
ALT: 22 U/L (ref 0–35)
AST: 30 U/L (ref 0–37)
Albumin: 4 g/dL (ref 3.5–5.2)
Alkaline Phosphatase: 94 U/L (ref 39–117)
BUN: 12 mg/dL (ref 6–23)
CO2: 34 mEq/L — ABNORMAL HIGH (ref 19–32)
Calcium: 9 mg/dL (ref 8.4–10.5)
Chloride: 99 mEq/L (ref 96–112)
Creatinine, Ser: 0.82 mg/dL (ref 0.40–1.20)
GFR: 69.33 mL/min (ref >60.00)
Glucose, Bld: 113 mg/dL — ABNORMAL HIGH (ref 70–99)
Potassium: 3.9 mEq/L (ref 3.5–5.1)
Sodium: 139 mEq/L (ref 135–145)
Total Bilirubin: 0.7 mg/dL (ref 0.2–1.2)
Total Protein: 7 g/dL (ref 6.0–8.3)

## 2019-11-27 MED ORDER — DICYCLOMINE HCL 10 MG PO CAPS: 10.0000 mg | ORAL_CAPSULE | Freq: Two times a day (BID) | ORAL | 4 refills | Status: DC

## 2019-11-27 MED ORDER — HYDROCORTISONE ACETATE 25 MG RE SUPP: 25.0000 mg | Freq: Two times a day (BID) | RECTAL | 4 refills | Status: DC

## 2019-11-27 NOTE — Progress Notes (Signed)
Subjective:    Patient ID: Kathy [name], female    DOB: December 10, 1950, 69 y.o.   MRN: [id number]  HPI Kathy [name] is a pleasant 69 year old white female, known to Dr. Carlean Purl, who comes in today with complaints of intermittent rectal bleeding and recent generalized abdominal pain. Patient has history of obesity, atrial fibrillation for which she is on Eliquis, sleep apnea, hypertension, congestive heart failure with severe pulmonary hypertension, followed by Dr. Acie Fredrickson, prior history of melanoma and history of IBS. She had colonoscopy in April 2017 showing multiple diverticuli in the left colon she had 2 tiny cecal polyps removed, no internal hemorrhoids noted.  Polyps were tubular adenomas and recommended for 5-year interval follow-up.  Colonoscopy in 2012 did mention small internal hemorrhoids, EGD 2012 was normal.  Patient says she has been having intermittent small-volume hematochezia for quite a while.  She says some episodes seem to be a bit more blood than others, usually sees blood just on the tissue.  She had a recent episode waking up in the morning with blood noted on her gown which had scared her. She says she is also been having rather generalized abdominal pain which she describes as a "stomachache" waking her from sleep off and on over the past 3 to 4 weeks.  She Says Usually She Gets up Has Some Sensation That She Needs to Have a Bowel Movement and If She Has a Bowel Movement Pain Will Generally Decrease. She Chronically Has 4-5 Bowel Movements per Day Generally Formed. Not Had Any Recent Fever or Chills, No Vomiting but Has Had Some Nausea and She Is on Omeprazole Chronically for GERD She Admits That She Has Been under A Lot Of Stress over the past Several Months.  No Regular NSAID Use  Review of Systems Pertinent positive and negative review of systems were noted in the above HPI section.  All other review of systems was otherwise negative.  Outpatient Encounter Medications as  of 11/27/2019  Medication Sig  . albuterol (PROVENTIL HFA;VENTOLIN HFA) 108 (90 Base) MCG/ACT inhaler Inhale 2 puffs into the lungs every 6 (six) hours as needed for wheezing or shortness of breath.  . ALPRAZolam (XANAX) 1 MG tablet Take 1 tablet (1 mg total) by mouth 2 (two) times daily.  Marland Kitchen apixaban (ELIQUIS) 5 MG TABS tablet Take 1 tablet (5 mg total) by mouth 2 (two) times daily.  Marland Kitchen atorvastatin (LIPITOR) 40 MG tablet Take 1 tablet (40 mg total) by mouth daily at 6 PM.  . CALCIUM PO Take 1,000 mg by mouth daily.   . clobetasol (TEMOVATE) 0.05 % external solution Apply 1 application topically daily as needed (rosacea).  . cyclobenzaprine (FLEXERIL) 10 MG tablet Take 1 tablet (10 mg total) by mouth 4 (four) times daily as needed. SPASMS  . famotidine-calcium carbonate-magnesium hydroxide (PEPCID COMPLETE) 10-800-165 MG CHEW chewable tablet 1 -2 tablets as needed to prevent or treat heartburn  . fluocinonide (LIDEX) 0.05 % external solution Apply 1 application topically daily as needed (rosacea).  Marland Kitchen FLUoxetine (PROZAC) 40 MG capsule Take 1 capsule (40 mg total) by mouth daily.  . furosemide (LASIX) 40 MG tablet Take 40 mg every other day alternating with 20 mg tablets  . HYDROcodone-acetaminophen (NORCO/VICODIN) 5-325 MG tablet Take 1 tablet by mouth every 6 (six) hours as needed for moderate pain.   . metoprolol succinate (TOPROL-XL) 100 MG 24 hr tablet TAKE 1 TABLET BY MOUTH EVERY DAY  . Multiple Vitamin (MULTIVITAMIN) tablet Take 1 tablet by mouth daily.  Marland Kitchen  olmesartan (BENICAR) 40 MG tablet TAKE 1 TABLET BY MOUTH EVERY DAY  . omeprazole (PRILOSEC) 40 MG capsule TAKE 1 CAPSULE BY MOUTH EVERY DAY  . potassium chloride (K-DUR) 10 MEQ tablet Take 1 tablet (10 mEq total) by mouth daily.  Marland Kitchen dicyclomine (BENTYL) 10 MG capsule Take 1 capsule (10 mg total) by mouth 2 (two) times daily.  . hydrocortisone (ANUSOL-HC) 25 MG suppository Place 1 suppository (25 mg total) rectally every 12 (twelve) hours.    No facility-administered encounter medications on file as of 11/27/2019.   Allergies  Allergen Reactions  . Achromycin [Tetracycline Hcl] Shortness Of Breath    Dyspnea    . Penicillins     Facial angioedema Did it involve swelling of the face/tongue/throat, SOB, or low BP? Yes Did it involve sudden or severe rash/hives, skin peeling, or any reaction on the inside of your mouth or nose? No Did you need to seek medical attention at a hospital or doctor's office? No When did it last happen?30+ years If all above answers are "NO", may proceed with cephalosporin use.   . Sulfamethoxazole-Trimethoprim Hives  . Nickel Itching  . Zanaflex [Tizanidine Hydrochloride]     Causes more pain    Patient Active Problem List   Diagnosis Date Noted  . OSA on CPAP   . Depression, recurrent ([place]) 05/06/2019  . Persistent atrial fibrillation ([place]) 12/31/2018  . Chronic diastolic CHF (congestive heart failure) ([place]) 12/31/2018  . Bilateral leg edema 07/29/2018  . Paroxysmal atrial fibrillation ([place]) 06/25/2018  . Chest pain 06/25/2018  . Anxiety 06/25/2018  . Difficulty walking 02/13/2017  . Low back pain 02/13/2017  . Allergic rhinitis 07/31/2016  . Malignant melanoma ([place]) 08/31/2013  . Other abnormal glucose 01/22/2013  . Anemia, unspecified 01/22/2013  . OA (osteoarthritis) of hip 03/18/2012  . Personal history of DVT (deep vein thrombosis) 01/05/2012  . History of colonic polyps 08/08/2011  . ARTHRITIS, HIP 01/24/2010  . PERSONAL HISTORY OF FAILED MODERATE SEDATION 07/21/2009  . Morbid obesity ([place]) 04/05/2009  . Esophageal reflux 02/25/2009  . HEART VALVE DISEASE 12/28/2008  . HEADACHE 04/21/2008  . Essential hypertension 10/15/2007  . LUMBAR RADICULOPATHY, RIGHT 10/15/2007  . FATIGUE 10/15/2007  . IRRITABLE BOWEL SYNDROME 07/23/2007  . Fibromyalgia 07/23/2007   Social History   Socioeconomic History  . Marital status: Married    Spouse name: Not on file  . Number  of children: 2  . Years of education: Not on file  . Highest education level: Not on file  Occupational History  . 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 Smoking status: Never Smoker  . Smokeless tobacco: Never Used  Substance and Sexual Activity  . Alcohol use: Yes    Comment: occasional  . Drug use: No  . Sexual activity: Not on file  Other Topics Concern  . Not on file  Social History Narrative   0 caffeine drinks    Social Determinants of Health   Financial Resource Strain:   . Difficulty of Paying Living Expenses:   Food Insecurity:   . Worried About Charity fundraiser in the Last Year:   . Arboriculturist in the Last Year:   Transportation Needs:   . Film/video editor (Medical):   Marland Kitchen Lack of Transportation (Non-Medical):   Physical Activity:   . Days of Exercise per Week:   . Minutes of Exercise per Session:   Stress:   . Feeling of Stress :   Social Connections:   .  Frequency of Communication with Friends and Family:   . Frequency of Social Gatherings with Friends and Family:   . Attends Religious Services:   . Active Member of Clubs or Organizations:   . Attends Archivist Meetings:   Marland Kitchen Marital Status:   Intimate Partner Violence:   . Fear of Current or Ex-Partner:   . Emotionally Abused:   Marland Kitchen Physically Abused:   . Sexually Abused:     Ms. Dillehay family history includes Breast cancer in her paternal aunt; Breast cancer (age of onset: 80) in her mother; COPD in her maternal grandmother and mother; Diabetes in her father and paternal grandmother; Heart attack (age of onset: 69) in her brother; Heart attack (age of onset: 17) in her mother; Heart disease in her paternal grandmother; Heart failure (age of onset: 72) in her father; Hypertension in her father; Stroke in her paternal grandfather.      Objective:    Vitals:   11/27/19 1102  BP: 132/72  Pulse: 74  Temp: 98.4 F (36.9 C)    Physical Exam Well-developed well-nourished obese  white female ,in no acute distress.  Height, Weight, 297 BMI 45.8  HEENT; nontraumatic normocephalic, EOMI, PER R LA, sclera anicteric. Oropharynx; not examined/mask/Covid Neck; supple, no JVD Cardiovascular; regular rate and rhythm with S1-S2, no murmur rub or gallop Pulmonary; Clear bilaterally Abdomen; soft, obese, she has some tenderness in the right mid and right lower quadrant as well as the left lower quadrant , nondistended, no palpable mass or hepatosplenomegaly, bowel sounds are active Rectal; no external lesion or hemorrhoids, she has 2 small hemorrhoids visible on anoscopy no current oozing Skin; benign exam, no jaundice rash or appreciable lesions Extremities; no clubbing cyanosis or edema skin warm and dry Neuro/Psych; alert and oriented x4, grossly nonfocal mood and affect appropriate       Assessment & Plan:   #46 69 year old white female with intermittent small-volume hematochezia with blood generally noted on the tissue, and in setting of chronic Eliquis. On anoscopy she has 2 small internal hemorrhoids. Current bleeding is consistent with bleeding secondary to internal hemorrhoids.  She is up-to-date with colonoscopy, last done April 2017 with 2 diminutive adenomatous colon polyps removed and indicated for 5-year interval follow-up  #2   3 to 4-week history of intermittent generalized abdominal pain frequently waking her at night, some associated nausea Etiology not clear, she does have history of IBS however nocturnal pain is generally not consistent  #3 diverticulosis 4.  Atrial fibrillation 5.  Chronic anticoagulation-on Eliquis 6.  Morbid obesity 7.  Sleep apnea 8.  Severe pulmonary hypertension  Plan; start hydrocortisone suppositories as needed for rectal bleeding.  She is asked to use suppositories nightly for 4 to 5 days after an episode of bleeding. She has any changes in pattern of bleeding or increase in bleeding she has been advised to return for  reassessment.  will check CBC with differential, c-Met, Schedule for CT of the abdomen and pelvis with contrast Start trial of dicyclomine 10 mg p.o. twice daily.  Further plans pending results of above.    S  PA-C 11/27/2019   Cc: Isaac Bliss, [place]*

## 2019-11-27 NOTE — Patient Instructions (Signed)
We have sent the following medications to your pharmacy for you to pick up at your convenience: Anusol supp, Bentyl  You have been scheduled for a CT scan of the abdomen and pelvis at [facility] are scheduled on 12/05/19 at 3:00pm. You should arrive 15 minutes prior to your appointment time for registration. Please follow the written instructions below on the day of your exam:  WARNING: IF YOU ARE ALLERGIC TO IODINE/X-RAY DYE, PLEASE NOTIFY RADIOLOGY IMMEDIATELY AT [phone]! YOU WILL BE GIVEN A 13 HOUR PREMEDICATION PREP.  1) Do not eat or drink anything after 11:00am (4 hours prior to your test) 2) You have been given 2 bottles of oral contrast to drink. The solution may taste better if refrigerated, but do NOT add ice or any other liquid to this solution. Shake well before drinking.    Drink 1 bottle of contrast @ 1:00pm (2 hours prior to your exam)  Drink 1 bottle of contrast @ 2:00pm (1 hour prior to your exam)  You may take any medications as prescribed with a small amount of water, if necessary. If you take any of the following medications: METFORMIN, GLUCOPHAGE, GLUCOVANCE, AVANDAMET, RIOMET, FORTAMET, [place] MET, JANUMET, GLUMETZA or METAGLIP, you MAY be asked to HOLD this medication 48 hours AFTER the exam.  The purpose of you drinking the oral contrast is to aid in the visualization of your intestinal tract. The contrast solution may cause some diarrhea. Depending on your individual set of symptoms, you may also receive an intravenous injection of x-ray contrast/dye. Plan on being at [facility] for 30 minutes or longer, depending on the type of exam you are having performed.  This test typically takes 30-45 minutes to complete.  If you have any questions regarding your exam or if you need to reschedule, you may call the CT department at [phone] between the hours of 8:00 am and 5:00 pm,  Monday-Friday.  ________________________________________________________________________  Your provider has requested that you go to the basement level for lab work before leaving today. Press "B" on the elevator. The lab is located at the first door on the left as you exit the elevator.  Thank you for choosing me and [facility].

## 2019-12-01 ENCOUNTER — Ambulatory Visit
Admission: RE | Admit: 2019-12-01 | Discharge: 2019-12-01 | Disposition: A | Discharge status: Home / Self Care | Payer: Medicare Other | Source: Ambulatory Visit | Attending: Internal Medicine | Admitting: Internal Medicine

## 2019-12-01 ENCOUNTER — Other Ambulatory Visit: Payer: Self-pay

## 2019-12-01 ENCOUNTER — Ambulatory Visit: Payer: Medicare Other

## 2019-12-01 DIAGNOSIS — N644 Mastodynia: Secondary | ICD-10-CM

## 2019-12-05 ENCOUNTER — Other Ambulatory Visit: Payer: Self-pay

## 2019-12-05 ENCOUNTER — Ambulatory Visit (HOSPITAL_COMMUNITY)
Admission: RE | Admit: 2019-12-05 | Discharge: 2019-12-05 | Disposition: A | Discharge status: Home / Self Care | Payer: Medicare Other | Source: Ambulatory Visit | Attending: Physician Assistant | Admitting: Physician Assistant

## 2019-12-05 DIAGNOSIS — Z8719 Personal history of other diseases of the digestive system: Secondary | ICD-10-CM | POA: Diagnosis present

## 2019-12-05 DIAGNOSIS — R1084 Generalized abdominal pain: Secondary | ICD-10-CM | POA: Diagnosis present

## 2019-12-05 DIAGNOSIS — K648 Other hemorrhoids: Secondary | ICD-10-CM

## 2019-12-05 DIAGNOSIS — K625 Hemorrhage of anus and rectum: Secondary | ICD-10-CM | POA: Insufficient documentation

## 2019-12-05 MED ORDER — IOHEXOL 300 MG/ML  SOLN
100.0000 mL | Freq: Once | INTRAMUSCULAR | Status: AC | PRN
  Administered 2019-12-05: 100 mL via INTRAVENOUS

## 2019-12-05 MED ORDER — SODIUM CHLORIDE (PF) 0.9 % IJ SOLN
INTRAMUSCULAR | Status: AC
  Filled 2019-12-05: qty 50

## 2019-12-06 ENCOUNTER — Other Ambulatory Visit: Payer: Self-pay | Admitting: Internal Medicine

## 2019-12-06 DIAGNOSIS — F419 Anxiety disorder, unspecified: Secondary | ICD-10-CM

## 2020-01-01 ENCOUNTER — Other Ambulatory Visit: Payer: Self-pay | Admitting: Internal Medicine

## 2020-01-01 DIAGNOSIS — K219 Gastro-esophageal reflux disease without esophagitis: Secondary | ICD-10-CM

## 2020-01-04 ENCOUNTER — Other Ambulatory Visit: Payer: Self-pay | Admitting: Cardiovascular Disease

## 2020-01-12 ENCOUNTER — Other Ambulatory Visit: Payer: Self-pay | Admitting: Cardiovascular Disease

## 2020-01-16 ENCOUNTER — Other Ambulatory Visit: Payer: Self-pay | Admitting: Internal Medicine

## 2020-01-16 DIAGNOSIS — F419 Anxiety disorder, unspecified: Secondary | ICD-10-CM

## 2020-01-25 ENCOUNTER — Other Ambulatory Visit: Payer: Self-pay | Admitting: Internal Medicine

## 2020-01-25 DIAGNOSIS — I10 Essential (primary) hypertension: Secondary | ICD-10-CM

## 2020-01-25 DIAGNOSIS — I480 Paroxysmal atrial fibrillation: Secondary | ICD-10-CM

## 2020-02-14 ENCOUNTER — Other Ambulatory Visit: Payer: Self-pay | Admitting: Cardiovascular Disease

## 2020-02-23 ENCOUNTER — Other Ambulatory Visit: Payer: Self-pay | Admitting: Internal Medicine

## 2020-02-23 DIAGNOSIS — I480 Paroxysmal atrial fibrillation: Secondary | ICD-10-CM

## 2020-02-29 ENCOUNTER — Other Ambulatory Visit: Payer: Self-pay | Admitting: Internal Medicine

## 2020-02-29 DIAGNOSIS — F419 Anxiety disorder, unspecified: Secondary | ICD-10-CM

## 2020-03-02 ENCOUNTER — Inpatient Hospital Stay: Payer: Medicare Other | Admitting: Family Medicine

## 2020-03-05 ENCOUNTER — Ambulatory Visit (INDEPENDENT_AMBULATORY_CARE_PROVIDER_SITE_OTHER): Payer: Medicare Other | Admitting: Family Medicine

## 2020-03-05 ENCOUNTER — Other Ambulatory Visit: Payer: Self-pay

## 2020-03-05 ENCOUNTER — Encounter: Payer: Self-pay | Admitting: Family Medicine

## 2020-03-05 VITALS — BP 132/64 | Temp 97.3°F | Wt 283.2 lb

## 2020-03-05 DIAGNOSIS — I10 Essential (primary) hypertension: Secondary | ICD-10-CM | POA: Diagnosis not present

## 2020-03-05 DIAGNOSIS — F419 Anxiety disorder, unspecified: Secondary | ICD-10-CM | POA: Diagnosis not present

## 2020-03-05 DIAGNOSIS — I5032 Chronic diastolic (congestive) heart failure: Secondary | ICD-10-CM

## 2020-03-05 DIAGNOSIS — I480 Paroxysmal atrial fibrillation: Secondary | ICD-10-CM | POA: Diagnosis not present

## 2020-03-05 DIAGNOSIS — R600 Localized edema: Secondary | ICD-10-CM

## 2020-03-05 MED ORDER — DILTIAZEM HCL ER COATED BEADS 120 MG PO CP24: 120.0000 mg | ORAL_CAPSULE | Freq: Every day | ORAL | 2 refills | Status: DC

## 2020-03-05 MED ORDER — ALPRAZOLAM 1 MG PO TABS: 1.0000 mg | ORAL_TABLET | Freq: Two times a day (BID) | ORAL | 0 refills | Status: DC

## 2020-03-05 NOTE — Progress Notes (Signed)
   Subjective:    Patient ID: Kathy [name], female    DOB: Oct 05, 1950, 69 y.o.   MRN: [id number]  HPI Here to follow up a hospital stay from 02-03-20 to 02-06-20 at [facility] in [place]. She was vacationing at [employer] when one day she felt weak and apparently passed out and fell to the floor in her house. At the hospital she was in atrial fibrillation as usual, but her ventricular response was quite rapid. All other labs, scans, and ECHO were unremarkable. Diltiazem was added to her regimen, and converted to sinus rhythm. Her pulse and BP was stable after that so she was sent home. Since then she has felt fine, and her BP has been stable. She takes 40 mg a day of Lasix, and her leg edema has been well controlled.    Review of Systems  Constitutional: Negative.   Respiratory: Negative.   Cardiovascular: Negative.   Neurological: Negative.        Objective:   Physical Exam Constitutional:      Appearance: Normal appearance.  Cardiovascular:     Rate and Rhythm: Normal rate and regular rhythm.     Pulses: Normal pulses.     Heart sounds: Normal heart sounds.  Pulmonary:     Effort: Pulmonary effort is normal.     Breath sounds: Normal breath sounds.  Musculoskeletal:     Right lower leg: No edema.     Left lower leg: No edema.  Neurological:     General: No focal deficit present.     Mental Status: She is alert and oriented to person, place, and time.           Assessment & Plan:  She has converted to NSR and her rate and BP are well controlled. Her CHF and leg edema are controlled. She will stay on her current regimen. She is scheduled to see Dr. Acie Fredrickson on 03-08-20. Alysia Penna, MD

## 2020-03-08 ENCOUNTER — Encounter: Payer: Self-pay | Admitting: Cardiovascular Disease

## 2020-03-08 ENCOUNTER — Ambulatory Visit: Payer: Medicare Other | Admitting: Cardiovascular Disease

## 2020-03-08 ENCOUNTER — Other Ambulatory Visit: Payer: Self-pay

## 2020-03-08 ENCOUNTER — Telehealth: Payer: Self-pay | Admitting: Cardiovascular Disease

## 2020-03-08 VITALS — BP 112/66 | HR 62 | Ht 67.0 in | Wt 283.8 lb

## 2020-03-08 DIAGNOSIS — I10 Essential (primary) hypertension: Secondary | ICD-10-CM

## 2020-03-08 DIAGNOSIS — G4733 Obstructive sleep apnea (adult) (pediatric): Secondary | ICD-10-CM

## 2020-03-08 DIAGNOSIS — I480 Paroxysmal atrial fibrillation: Secondary | ICD-10-CM

## 2020-03-08 DIAGNOSIS — I5032 Chronic diastolic (congestive) heart failure: Secondary | ICD-10-CM | POA: Diagnosis not present

## 2020-03-08 MED ORDER — FUROSEMIDE 40 MG PO TABS: 40.0000 mg | ORAL_TABLET | Freq: Every day | ORAL | 3 refills | Status: DC

## 2020-03-08 MED ORDER — OLMESARTAN MEDOXOMIL 20 MG PO TABS: 20.0000 mg | ORAL_TABLET | Freq: Every day | ORAL | 11 refills | Status: DC

## 2020-03-08 NOTE — Progress Notes (Signed)
Cardiology Office Note:    Date:  03/08/2020   ID:  Kathy [name], DOB [birth year], MRN [id number]  PCP:  Isaac Bliss, Rayford Halsted, MD  Cardiologist:  Mertie Moores, MD  Electrophysiologist:  None   Referring MD: Isaac Bliss, [place]*   Problem List 1.  Chronic diastolic CHF 2. Morbid obesity 3. Severe pulmonary HTN 4. Mild CAD  5.   Chief Complaint  Patient presents with  . Atrial Fibrillation        Kathy [name] is a 69 y.o. female with a hx of  MVP, chest pain  Hyperlipidemia and atrial fib We are asked to see her by Isaac Bliss, Rayford Halsted, MD for further eval of her atrial fib  I have seen her many years ago .   Developed CP,  Called EMS ECG at home showed atrial fib  She was admitted to the hospital with mid sternal chest pain , atrial fib, HTN.  Troponin levels rose to 4.96.    cath showed mild irreg. CT angiogram of the chest showed no aortic dissection  Echo shows  Normal LV systolic function - severe concnetric LCH Grade 2 diastolic dysfunction   Severe pulmonary HTN with estimated PA pressure of 84.   Gained 16 lbs after her hospitalization Was started on Lasix by Dr. Sharlene Motts  BP is now under control.  Breathing seems to be better Does not do any exercise ( limited by orthopedic issues )    Feb. 25, 2020: Kathy [name] is seen back today for follow-up of her chronic diastolic congestive heart failure, paroxysmal atrial fibrillation and pulmonary hypertension.  Takes her BP and HR daily.   Is in atrial flutter today   Has been more short of breath for the past several days . Has started working part time.   Has had her 1st part of her sleep study .   Needs to go back for her CPAP titration  Is working on weight loss.  Still fluctuating .   Sept, 29, 2020 Kathy [name] is seen for follow up of her chronic diastolic CHF, severe pulmonary HTN, morbid obesity, Wt today is 295  ( up 9 lbs from last visit 6 months )  Has a bump  on the back of  her head   Breathing has been ok  Has not been exercising . And is drinking coke.    Is under some stress.   Husband has throat cancer.   06/21/2021[name] seen today for follow-up visit. She has a history of chronic diastolic CHF, severe pulmonary hypertension, morbid obesity and paroxysmal atrial fibrillation.  she was admitted with Atrial fib while in [place]  She got up to get some water and passed out on the way to the fridge. She felt like her HR was irreg.    EMS was called ,  Took her to the hospitial.  She was found to have atrial fib .  She converted on her own .  Did not reguire cardioversion.    Was Started on Diltiazem  Was started on Diltiazem and lasix was increased to 40 mg a day .  Is losing weight - 12 lbs since previoius office visit .   Past Medical History:  Diagnosis Date  . Adenomatous colon polyp    2012  . Anxiety   . Anxiety and depression   . Arthritis    Osteoarthritis, Dr Maureen Ralphs  . DDD (degenerative disc disease), cervical   . Depression   . Diverticulosis   .  Fibromyalgia    Dr Tobie Lords, Rheu  . GERD (gastroesophageal reflux disease)   . H/O hiatal hernia   . Headache(784.0)    PAST HX MILD MIGRAINES  . Heart murmur    mitral valve prolapse--no symptoms  . History of colonic polyps 08/08/2011       . HTN (hypertension)   . IBS (irritable bowel syndrome)   . Internal hemorrhoid   . Lumbar radiculopathy   . Obesity   . OSA on CPAP    Moderate with AHI 20/hr with nocturnal hypoxemia on CPAP  . Syncope    idiopathic, ? related to migraines or hyperventilation. Dr Leonie Man, Neurology,  ONE TIME EPISODE AND THIS WAS YEARS AGO    Past Surgical History:  Procedure Laterality Date  . ABDOMINAL HYSTERECTOMY     dysfunctional menses  . APPENDECTOMY    . BREAST EXCISIONAL BIOPSY Right   . CARDIAC CATHETERIZATION  06/26/2018  . CHOLECYSTECTOMY    . COLONOSCOPY  05/27/2002, 08/08/11   2003 diverticulosis, hemorrhoids 2012 same + cecal polyp   . KNEE ARTHROSCOPY  2008   right  . LEFT HEART CATH AND CORONARY ANGIOGRAPHY N/A 06/26/2018   Procedure: LEFT HEART CATH AND CORONARY ANGIOGRAPHY;  Surgeon: Wellington Hampshire, MD;  Location: [facility];  Service: Cardiovascular;  Laterality: N/A;  . MELANOMA EXCISION Left 07/2013   foot  . TOTAL HIP ARTHROPLASTY  02/2010   left ; Dr Maureen Ralphs  . TOTAL HIP ARTHROPLASTY  03/18/2012   Procedure: TOTAL HIP ARTHROPLASTY;  Surgeon: Gearlean Alf, MD;  Location: [facility];  Service: Orthopedics;  Laterality: Right;  . UPPER GASTROINTESTINAL ENDOSCOPY  04/21/2009, 08/08/11   2010 gastritis ;2012 small hiatal hernia. Dr Carlean Purl    Current Medications: Current Meds  Medication Sig  . acetaminophen (TYLENOL) 500 MG tablet Take 3 tablets by mouth as needed.  Marland Kitchen albuterol (PROVENTIL HFA;VENTOLIN HFA) 108 (90 Base) MCG/ACT inhaler Inhale 2 puffs into the lungs every 6 (six) hours as needed for wheezing or shortness of breath.  . ALPRAZolam (XANAX) 1 MG tablet Take 1 tablet (1 mg total) by mouth 2 (two) times daily.  Marland Kitchen atorvastatin (LIPITOR) 40 MG tablet Take 1 tablet (40 mg total) by mouth daily at 6 PM.  . Calcium Carbonate (CALCIUM 600 PO) Take 1 tablet by mouth daily.  Marland Kitchen CALCIUM PO Take 1,000 mg by mouth daily.   . clindamycin (CLEOCIN T) 1 % lotion Apply 1 application topically as needed.  . clobetasol (TEMOVATE) 0.05 % external solution Apply 1 application topically daily as needed (rosacea).  . cyclobenzaprine (FLEXERIL) 10 MG tablet Take 1 tablet (10 mg total) by mouth 4 (four) times daily as needed. SPASMS  . diltiazem (TIAZAC) 120 MG 24 hr capsule Take 1 capsule by mouth at bedtime.  Marland Kitchen ELIQUIS 5 MG TABS tablet TAKE 1 TABLET BY MOUTH TWICE A DAY  . famotidine-calcium carbonate-magnesium hydroxide (PEPCID COMPLETE) 10-800-165 MG CHEW chewable tablet 1 -2 tablets as needed to prevent or treat heartburn  . fluocinonide (LIDEX) 0.05 % external solution Apply 1 application topically daily as needed  (rosacea).  Marland Kitchen FLUoxetine (PROZAC) 40 MG capsule TAKE 1 CAPSULE BY MOUTH EVERY DAY  . HYDROcodone-acetaminophen (NORCO/VICODIN) 5-325 MG tablet Take 1 tablet by mouth every 6 (six) hours as needed for moderate pain.   . hydrocortisone (ANUSOL-HC) 25 MG suppository Place 1 suppository (25 mg total) rectally every 12 (twelve) hours.  . metoprolol succinate (TOPROL-XL) 100 MG 24 hr tablet TAKE 1  TABLET BY MOUTH EVERY DAY  . metroNIDAZOLE (METROCREAM) 0.75 % cream Apply 1 application topically as needed.  . Multiple Vitamins-Minerals (CENTRUM ULTRA WOMENS) TABS Take 1 tablet by mouth daily.  Marland Kitchen omeprazole (PRILOSEC) 40 MG capsule TAKE 1 CAPSULE BY MOUTH EVERY DAY  . ondansetron (ZOFRAN-ODT) 4 MG disintegrating tablet Take 4 mg by mouth every 8 (eight) hours as needed.  . potassium chloride (KLOR-CON) 10 MEQ tablet TAKE 1 TABLET BY MOUTH EVERY DAY  . [DISCONTINUED] furosemide (LASIX) 40 MG tablet TAKE 1 TABLET EVERY OTHER DAY ALTERNATING WITH 20 MG TABLETS  . [DISCONTINUED] olmesartan (BENICAR) 40 MG tablet TAKE 1 TABLET BY MOUTH EVERY DAY     Allergies:   Achromycin [tetracycline hcl], Penicillins, Sulfamethoxazole-trimethoprim, Nickel, and Zanaflex [tizanidine hydrochloride]   Social History   Socioeconomic History  . Marital status: Married    Spouse name: Not on file  . Number of children: 2  . Years of education: Not on file  . Highest education level: Not on file  Occupational History  . 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 Smoking status: Never Smoker  . Smokeless tobacco: Never Used  Vaping Use  . Vaping Use: Never used  Substance and Sexual Activity  . Alcohol use: Yes    Comment: occasional  . Drug use: No  . Sexual activity: Not on file  Other Topics Concern  . Not on file  Social History Narrative   0 caffeine drinks    Social Determinants of Health   Financial Resource Strain:   . Difficulty of Paying Living Expenses:   Food Insecurity:   . Worried About Paediatric nurse in the Last Year:   . Arboriculturist in the Last Year:   Transportation Needs:   . Film/video editor (Medical):   Marland Kitchen Lack of Transportation (Non-Medical):   Physical Activity:   . Days of Exercise per Week:   . Minutes of Exercise per Session:   Stress:   . Feeling of Stress :   Social Connections:   . Frequency of Communication with Friends and Family:   . Frequency of Social Gatherings with Friends and Family:   . Attends Religious Services:   . Active Member of Clubs or Organizations:   . Attends Archivist Meetings:   Marland Kitchen Marital Status:      Family History: The patient's family history includes Breast cancer in her paternal aunt; Breast cancer (age of onset: 43) in her mother; COPD in her maternal grandmother and mother; Diabetes in her father and paternal grandmother; Heart attack (age of onset: 4) in her brother; Heart attack (age of onset: 77) in her mother; Heart disease in her paternal grandmother; Heart failure (age of onset: 87) in her father; Hypertension in her father; Stroke in her paternal grandfather. There is no history of Colon cancer.  ROS:   Please see the history of present illness.     All other systems reviewed and are negative.  EKGs/Labs/Other Studies Reviewed:    The following studies were reviewed today:    Recent Labs: 11/27/2019: ALT 22; BUN 12; Creatinine, Ser 0.82; Hemoglobin 14.1; Platelets 212.0; Potassium 3.9; Sodium 139  Recent Lipid Panel    Component Value Date/Time   CHOL 235 (H) 04/30/2018 1134   TRIG 169.0 (H) 04/30/2018 1134   HDL 54.70 04/30/2018 1134   CHOLHDL 4 04/30/2018 1134   VLDL 33.8 04/30/2018 1134   LDLCALC 146 (H) 04/30/2018 1134   LDLDIRECT 143.2 01/22/2013  0856     Physical Exam: Blood pressure 112/66, pulse 62, height 5\' 7"  (1.702 m), weight 283 lb 12 oz (128.7 kg), SpO2 98 %.  GEN:   Middle age, morbidly obese female,  NAD  HEENT: Normal NECK: No JVD; No carotid bruits LYMPHATICS: No  lymphadenopathy CARDIAC: RRR  RESPIRATORY:  Clear to auscultation without rales, wheezing or rhonchi  ABDOMEN: Soft, non-tender, non-distended MUSCULOSKELETAL:  No edema; No deformity  SKIN: Warm and dry NEUROLOGIC:  Alert and oriented x 3    EKG:   March 08, 2020,  Sinus brady at 51.  No ST or T wave changes.    ASSESSMENT:    1. OSA (obstructive sleep apnea)   2. Essential hypertension   3. Chronic diastolic CHF (congestive heart failure) (HCC)   4. Paroxysmal atrial fibrillation (HCC)    PLAN:       Chronic diastolic congestive heart failure:     2.  Pulmonary hypertension:    stable  3.  Paroxysmal atrial fibrillation:     She was admitted with an episode of atrial fibrillation while in [place].  The episode was preceded by a syncopal episode.  She converted on her own.  Diltiazem was added and she seems to be doing much better.  4.  Coronary artery disease: She denies having any episodes of angina.  5.  Morbid obesity :    She is lost 12 pounds since I last saw her.  I have congratulated her and want her to continue with good diet, exercise, weight loss program.   Medication Adjustments/Labs and Tests Ordered: Current medicines are reviewed at length with the patient today.  Concerns regarding medicines are outlined above.  Orders Placed This Encounter  Procedures  . EKG 12-Lead   No orders of the defined types were placed in this encounter.   Patient Instructions  Medication Instructions:  Your physician recommends that you continue on your current medications as directed. Please refer to the Current Medication list given to you today.  *If you need a refill on your cardiac medications before your next appointment, please call your pharmacy*   Lab Work: None Ordered If you have labs (blood work) drawn today and your tests are completely normal, you will receive your results only by: Marland Kitchen MyChart Message (if you have MyChart) OR . A paper copy in the  mail If you have any lab test that is abnormal or we need to change your treatment, we will call you to review the results.   Testing/Procedures: None Ordered   Follow-Up: At [facility], you and your health needs are our priority.  As part of our continuing mission to provide you with exceptional heart care, we have created designated Provider Care Teams.  These Care Teams include your primary Cardiologist (physician) and Advanced Practice Providers (APPs -  Physician Assistants and Nurse Practitioners) who all work together to provide you with the care you need, when you need it.    Your next appointment:   6 month(s)  The format for your next appointment:   In Person  Provider:   You may see Mertie Moores, MD or one of the following Advanced Practice Providers on your designated Care Team:    Richardson Dopp, PA-C  Robbie Lis, [place]       Signed, Mertie Moores, MD  03/08/2020 6:00 PM    [place]

## 2020-03-08 NOTE — Patient Instructions (Signed)
Medication Instructions:  Your physician recommends that you continue on your current medications as directed. Please refer to the Current Medication list given to you today.  *If you need a refill on your cardiac medications before your next appointment, please call your pharmacy*   Lab Work: None Ordered If you have labs (blood work) drawn today and your tests are completely normal, you will receive your results only by: MyChart Message (if you have MyChart) OR A paper copy in the mail If you have any lab test that is abnormal or we need to change your treatment, we will call you to review the results.   Testing/Procedures: None Ordered   Follow-Up: At [facility], you and your health needs are our priority.  As part of our continuing mission to provide you with exceptional heart care, we have created designated Provider Care Teams.  These Care Teams include your primary Cardiologist (physician) and Advanced Practice Providers (APPs -  Physician Assistants and Nurse Practitioners) who all work together to provide you with the care you need, when you need it.   Your next appointment:   6 month(s)  The format for your next appointment:   In Person  Provider:   You may see Philip Nahser, MD or one of the following Advanced Practice Providers on your designated Care Team:   Scott Weaver, PA-C Vin Bhagat, PA-C   

## 2020-03-08 NOTE — Telephone Encounter (Signed)
Pt c/o BP issue: STAT if pt c/o blurred vision, one-sided weakness or slurred speech  1. What are your last 5 BP readings? Pt was seen earlier today and discussed her blood pressure- after she got home it was 91/68 and pulse was 62 100/64 and pulse 64- feel real tired-slow motion  2. Are you having any other symptoms (ex. Dizziness, headache, blurred vision, passed out)? no  3. What is your BP issue? blood pressure is running low

## 2020-03-08 NOTE — Telephone Encounter (Signed)
Called patient to discuss low BP. I have reviewed her concern with Dr. Acie Fredrickson and he advised that she reduce olmesartan to 20 mg daily. Pt has recently increased lasix to 40 mg daily due to HF seen during admission in [place] and she has lost some weight. Advised her to continue to monitor BP and call back if she does not see improvement in approximately 1 week. She verbalized understanding and agreement with plan and thanked me for the call.

## 2020-03-30 ENCOUNTER — Other Ambulatory Visit: Payer: Self-pay | Admitting: Internal Medicine

## 2020-03-30 DIAGNOSIS — E785 Hyperlipidemia, unspecified: Secondary | ICD-10-CM

## 2020-04-20 ENCOUNTER — Other Ambulatory Visit: Payer: Self-pay | Admitting: Internal Medicine

## 2020-04-20 DIAGNOSIS — F419 Anxiety disorder, unspecified: Secondary | ICD-10-CM

## 2020-04-24 ENCOUNTER — Other Ambulatory Visit: Payer: Self-pay | Admitting: Family Medicine

## 2020-04-29 IMAGING — DX DG CHEST 1V PORT
1 series · 1 of 1 positions shown · non-contrast
Comparison: 01/05/2018 chest radiograph and prior studies dating
back to 09/02/2004

CLINICAL DATA: Acute chest pain and shortness of breath today.

EXAM:
PORTABLE CHEST 1 VIEW

[chest]
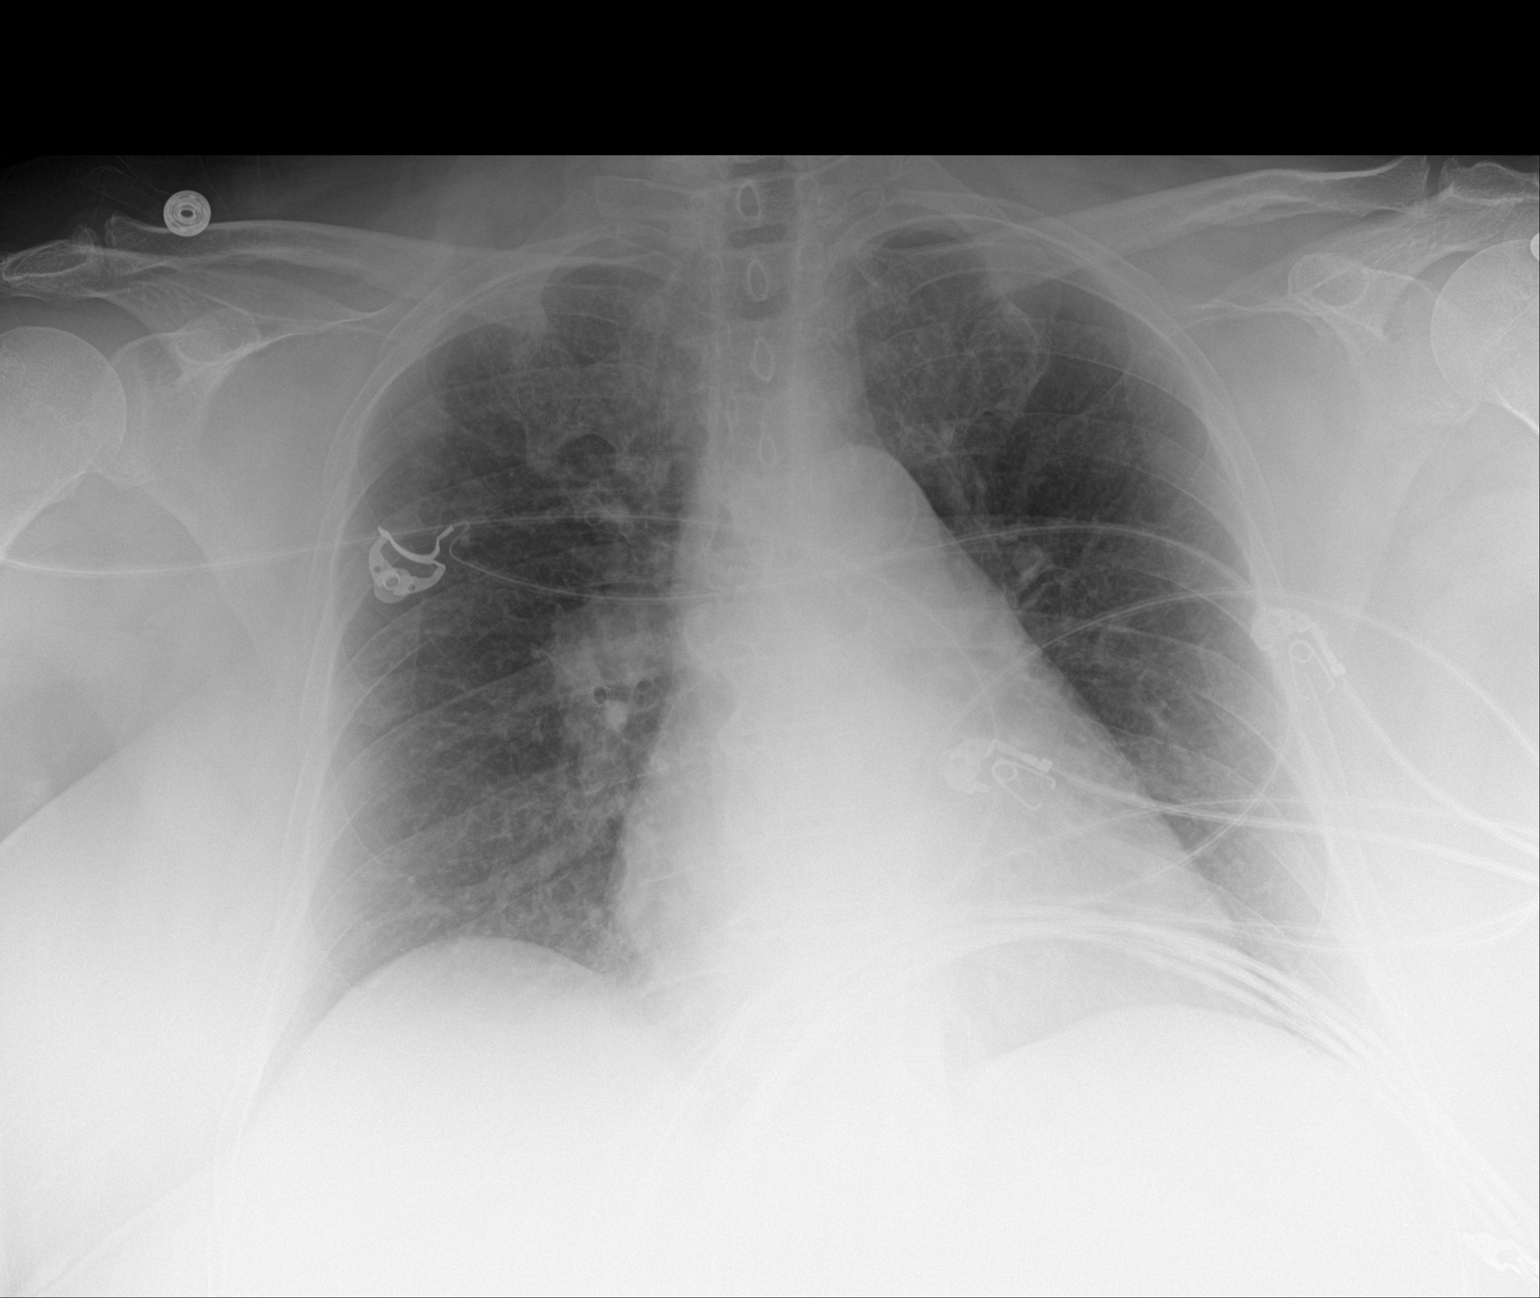

[1 of 1 positions shown; findings below may reference images not displayed]

FINDINGS: Cardiomegaly again noted.

There is no evidence of focal airspace disease, pulmonary edema,
suspicious pulmonary nodule/mass, pleural effusion, or pneumothorax.

No acute bony abnormalities are identified.
IMPRESSION: Cardiomegaly without evidence of acute cardiopulmonary disease.

## 2020-05-23 ENCOUNTER — Other Ambulatory Visit: Payer: Self-pay | Admitting: Internal Medicine

## 2020-05-23 DIAGNOSIS — F419 Anxiety disorder, unspecified: Secondary | ICD-10-CM

## 2020-06-16 ENCOUNTER — Other Ambulatory Visit: Payer: Self-pay | Admitting: Internal Medicine

## 2020-06-16 DIAGNOSIS — K219 Gastro-esophageal reflux disease without esophagitis: Secondary | ICD-10-CM

## 2020-06-16 DIAGNOSIS — F419 Anxiety disorder, unspecified: Secondary | ICD-10-CM

## 2020-06-18 ENCOUNTER — Other Ambulatory Visit: Payer: Self-pay | Admitting: Internal Medicine

## 2020-06-25 ENCOUNTER — Other Ambulatory Visit: Payer: Self-pay | Admitting: Internal Medicine

## 2020-06-25 DIAGNOSIS — F419 Anxiety disorder, unspecified: Secondary | ICD-10-CM

## 2020-07-02 ENCOUNTER — Other Ambulatory Visit: Payer: Self-pay | Admitting: Internal Medicine

## 2020-07-02 DIAGNOSIS — E785 Hyperlipidemia, unspecified: Secondary | ICD-10-CM

## 2020-07-04 ENCOUNTER — Other Ambulatory Visit: Payer: Self-pay | Admitting: Internal Medicine

## 2020-07-04 ENCOUNTER — Other Ambulatory Visit: Payer: Self-pay | Admitting: Cardiovascular Disease

## 2020-07-04 DIAGNOSIS — I480 Paroxysmal atrial fibrillation: Secondary | ICD-10-CM

## 2020-07-04 DIAGNOSIS — I10 Essential (primary) hypertension: Secondary | ICD-10-CM

## 2020-07-08 ENCOUNTER — Other Ambulatory Visit: Payer: Self-pay | Admitting: Internal Medicine

## 2020-07-08 DIAGNOSIS — I10 Essential (primary) hypertension: Secondary | ICD-10-CM

## 2020-07-08 DIAGNOSIS — E785 Hyperlipidemia, unspecified: Secondary | ICD-10-CM

## 2020-07-16 ENCOUNTER — Other Ambulatory Visit: Payer: Self-pay | Admitting: Internal Medicine

## 2020-07-16 DIAGNOSIS — F419 Anxiety disorder, unspecified: Secondary | ICD-10-CM

## 2020-07-16 MED ORDER — ALPRAZOLAM 1 MG PO TABS: 1.0000 mg | ORAL_TABLET | Freq: Two times a day (BID) | ORAL | 0 refills | Status: DC

## 2020-07-16 NOTE — Telephone Encounter (Signed)
Pt requesting refill for Alprazolam (xanex) 1mg . [place] go to: *[address], [place], [place]

## 2020-07-26 ENCOUNTER — Telehealth: Payer: Self-pay | Admitting: Internal Medicine

## 2020-07-26 DIAGNOSIS — G8929 Other chronic pain: Secondary | ICD-10-CM

## 2020-07-26 DIAGNOSIS — M545 Other chronic pain: Secondary | ICD-10-CM

## 2020-07-26 NOTE — Telephone Encounter (Signed)
Friday at [facility] pt broke her back & ambulance took pt to [place] in [place]. Per spouse Vonne Mcdanel [phone] pt was not admitted to hospital. Spouse called to cancel appt 07/30/20 with PCP. Pt wants to inform PCP as soon as pt is able to get in car they will drive home & need referral to neurosurgeon.

## 2020-07-27 NOTE — Telephone Encounter (Signed)
Sorry to hear this. OK for neurosurgical referral. Please advise them to get copies of imaging reports and Files with images for review.

## 2020-07-27 NOTE — Telephone Encounter (Signed)
Referral placed and husband is aware

## 2020-07-30 ENCOUNTER — Encounter: Payer: Medicare Other | Admitting: Internal Medicine

## 2020-08-02 ENCOUNTER — Telehealth: Payer: Self-pay | Admitting: Internal Medicine

## 2020-08-02 NOTE — Telephone Encounter (Signed)
Ila is calling and stated that patient suffered a lumbar compression fracture and is requesting verbal orders for PT frequency of 2x a week for 2 weeks, please advise. CB is [phone].

## 2020-08-03 NOTE — Telephone Encounter (Signed)
Left message on machine for Ila to return our call.

## 2020-08-03 NOTE — Telephone Encounter (Signed)
Ok for PT as requested. 

## 2020-08-03 NOTE — Telephone Encounter (Signed)
Verbal orders given to Ila.

## 2020-08-09 ENCOUNTER — Telehealth: Payer: Self-pay | Admitting: *Deleted

## 2020-08-09 NOTE — Telephone Encounter (Signed)
Pharmacy, can you please comment on how long Eliquis can be held for upcoming procedure?  Thank you! 

## 2020-08-09 NOTE — Telephone Encounter (Signed)
   [facility] Pre-operative Risk Assessment    HEARTCARE STAFF: - Please ensure there is not already an duplicate clearance open for this procedure. - Under Visit Info/Reason for Call, type in Other and utilize the format Clearance MM/DD/YY or Clearance TBD. Do not use dashes or single digits. - If request is for dental extraction, please clarify the # of teeth to be extracted.  Request for surgical clearance:  1. What type of surgery is being performed? LUMBAR LAMINECTOMY   2. When is this surgery scheduled? TBD   3. What type of clearance is required (medical clearance vs. Pharmacy clearance to hold med vs. Both)? BOTH  4. Are there any medications that need to be held prior to surgery and how long? ELIQUIS   5. Practice name and name of physician performing surgery? [facility]; DR. JEFFREY JENKINS   6. What is the office phone number? [phone]   7.   What is the office fax number? [place]   Anesthesia type (None, local, MAC, general) ? GENERAL   Julaine Hua 08/09/2020, 2:27 PM  _________________________________________________________________   (provider comments below)

## 2020-08-10 NOTE — Telephone Encounter (Signed)
Left message to call back and ask to speak with pre-op team.  Darreld Mclean, PA-C 08/10/2020 9:18 AM Pager: [phone]

## 2020-08-10 NOTE — Telephone Encounter (Signed)
Patient with diagnosis of A Fib  on Eliquis for anticoagulation.    Procedure: LUMBAR LAMINECTOMY  Date of procedure: TBD  CHA2DS2-VASc Score = 5  This indicates a 7.2% annual risk of stroke. The patient's score is based upon: CHF History: 1 HTN History: 1 Diabetes History: 0 Stroke History: 0 Vascular Disease History: 1 Age Score: 1 Gender Score: 1   Of note, patient has history of DVT following knee surgery in 2007.   CrCl 85 mL/min using adjusted body weight Platelet count 194K  Per office protocol, patient can hold Eliquis for 3 days prior to procedure.   Patient will not need bridging with Lovenox (enoxaparin) around procedure.  If not bridging, patient should restart Eliquis on the evening of procedure or day after, at discretion of procedure MD

## 2020-08-10 NOTE — Telephone Encounter (Signed)
   Primary Cardiologist: Mertie Moores, MD  Chart reviewed as part of pre-operative protocol coverage. Patient was last seen by Dr. Acie Fredrickson in 02/2020 at which time she was doing well from a cardiac standpoint. Patient was contacted today for additional pre-op evaluation. She is having significant back pain but is stable from a cardiac standpoint. She has chronic shortness of breath that is stable. No orthopnea or PND. Chronic lower extremity edema is stable. She continues to have occasional brief episodes of atypical chest pain that is unchanged from the past. No exertional chest pain or anything that sounds like angina. She has occasional brief episodes of palpitations but nothing sustained. No syncope. Prior to back recent back injury, she was able to complete >4.0 METS of activity. Given past medical history and time since last visit, based on ACC/AHA guidelines, Kathy [name] would be at acceptable risk for the planned procedure without further cardiovascular testing.   Per Pharmacy and office protocol: Patient can hold Eliquis for 3 days prior to procedure.  Patient will not need bridging with Lovenox (enoxaparin) around procedure. If not bridging, patient should restart Eliquis on the evening of procedure or day after, at discretion of procedure MD.  I will route this recommendation to the requesting party via [place] fax function and remove from pre-op pool.  Please call with questions.  Darreld Mclean, PA-C 08/10/2020, 9:53 AM

## 2020-08-10 NOTE — Telephone Encounter (Signed)
Kathy [name] is returning Callie's call. Please advise.

## 2020-08-14 IMAGING — MG DIGITAL SCREENING BILATERAL MAMMOGRAM WITH TOMO AND CAD
8 series · 8 of 24 positions shown · non-contrast
Comparison: Previous exam(s).

CLINICAL DATA: Screening.

EXAM:
DIGITAL SCREENING BILATERAL MAMMOGRAM WITH TOMO AND CAD

[R CC synth-2D]
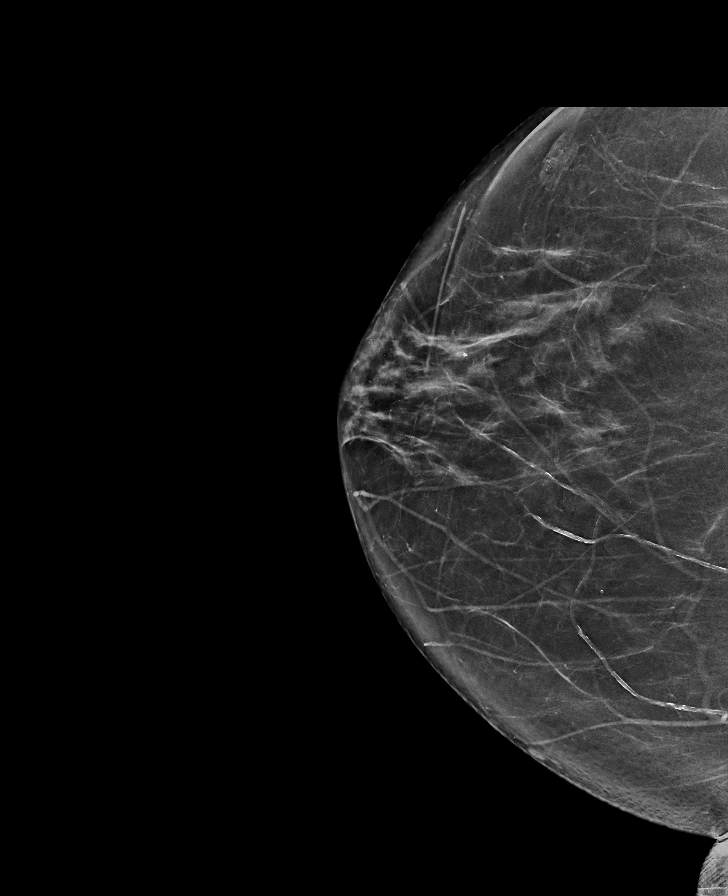

[L MLO synth-2D]
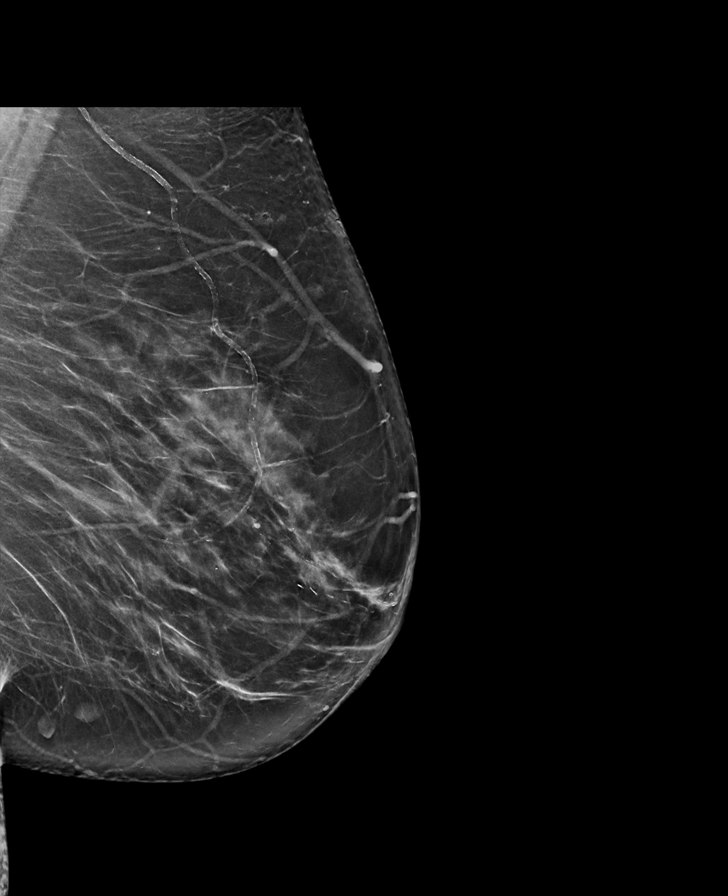

[R MLO synth-2D]
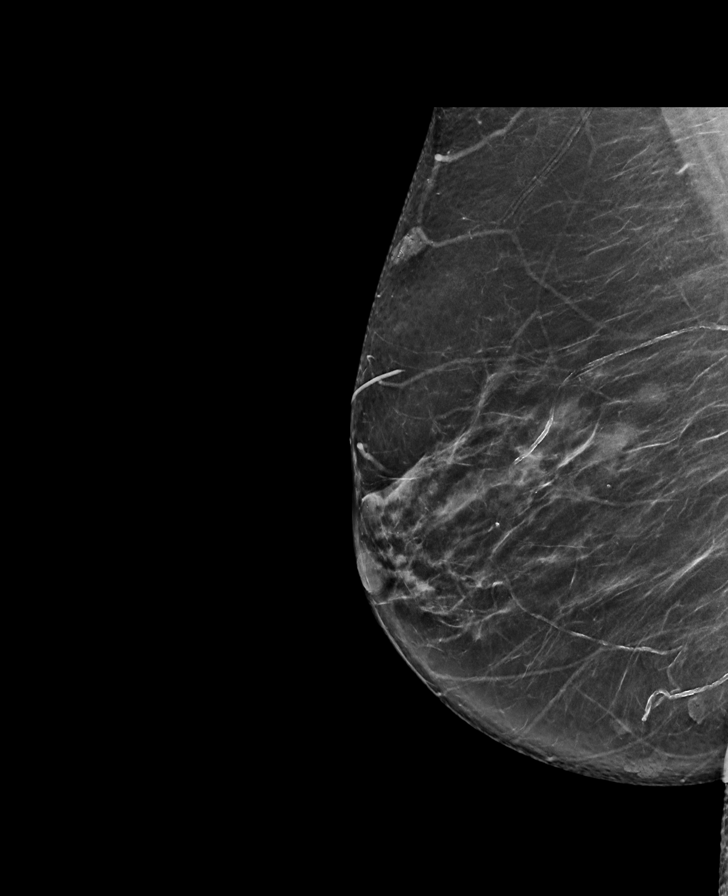

[L CC synth-2D]
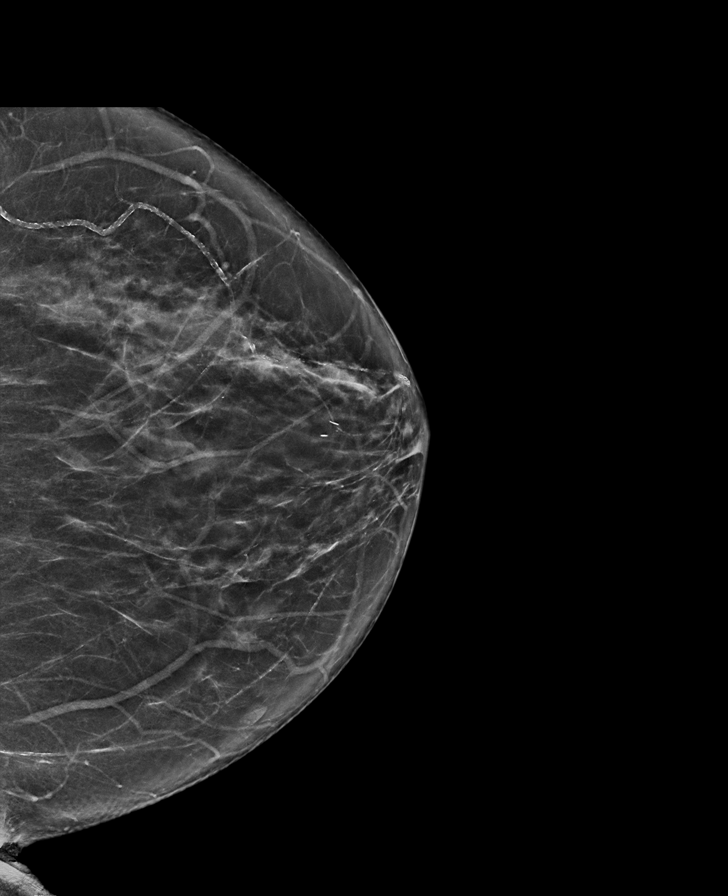

[R MLO tomo · tomo slice 40/79.0]
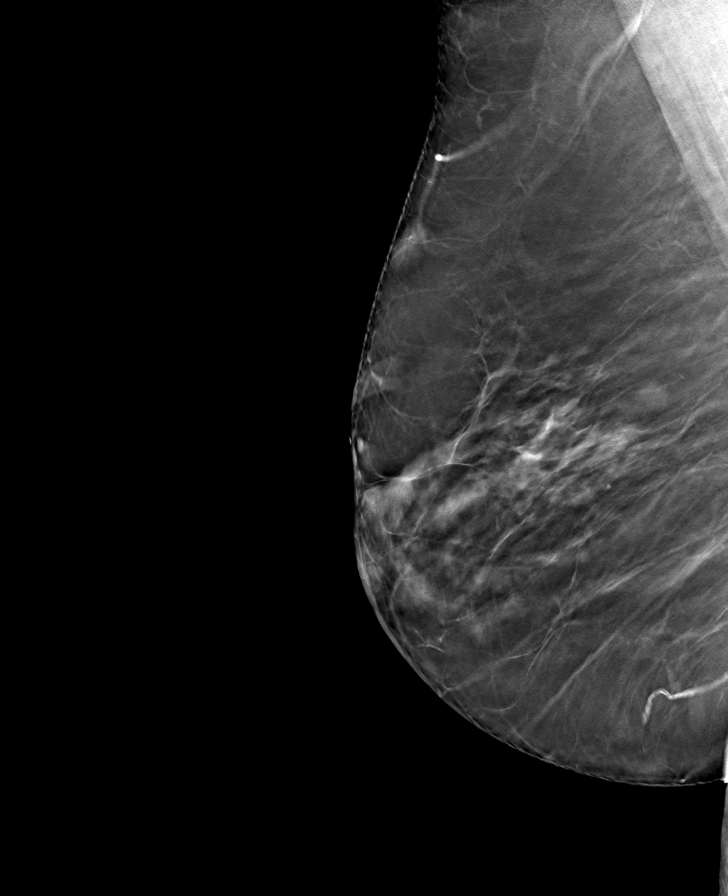

[L MLO tomo · tomo slice 43/85.0]
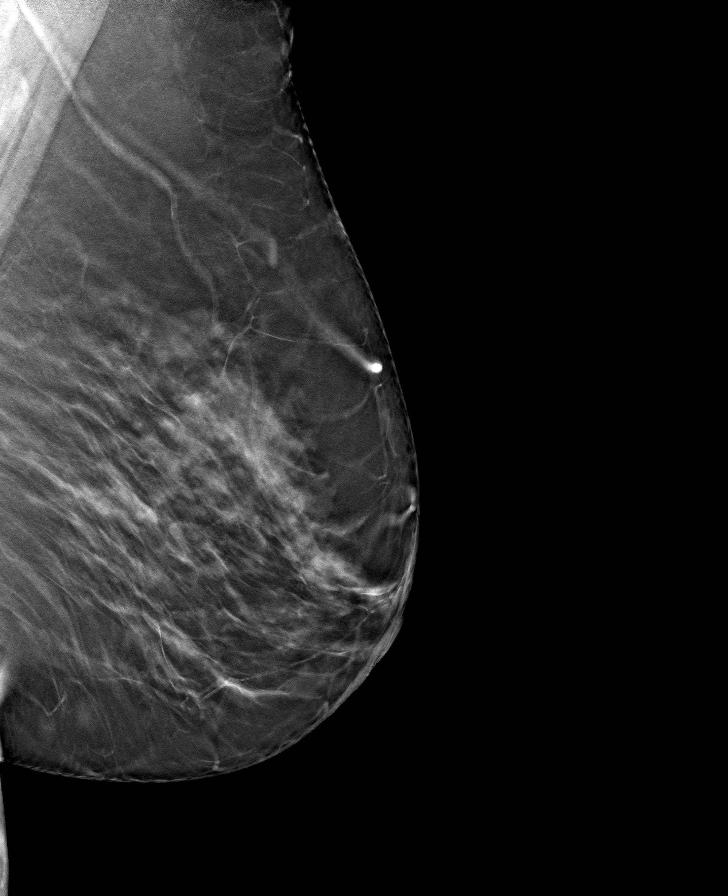

[L CC tomo · tomo slice 37/72.0]
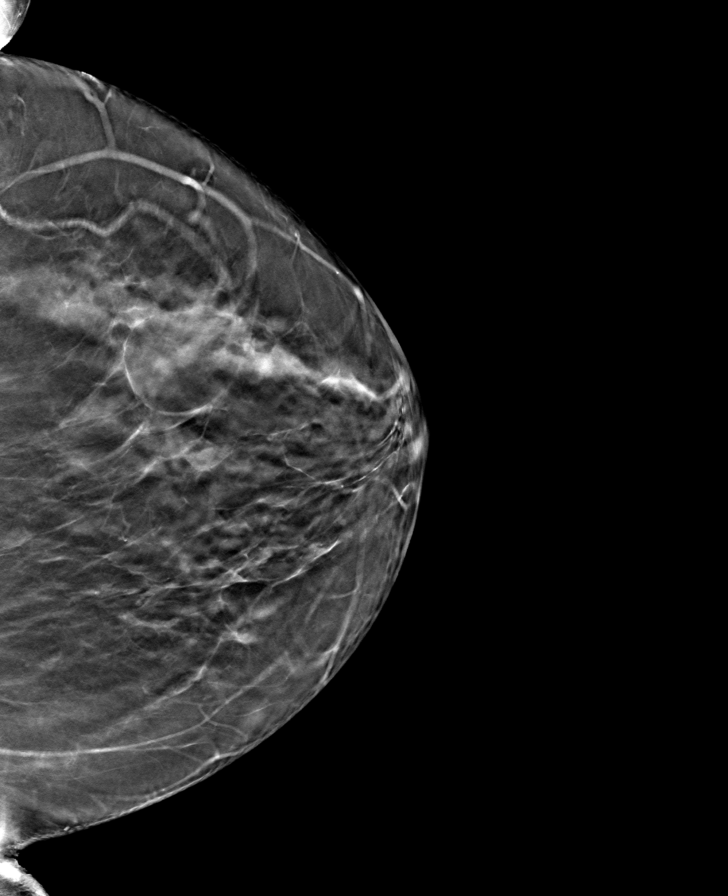

[R CC tomo · tomo slice 39/77.0]
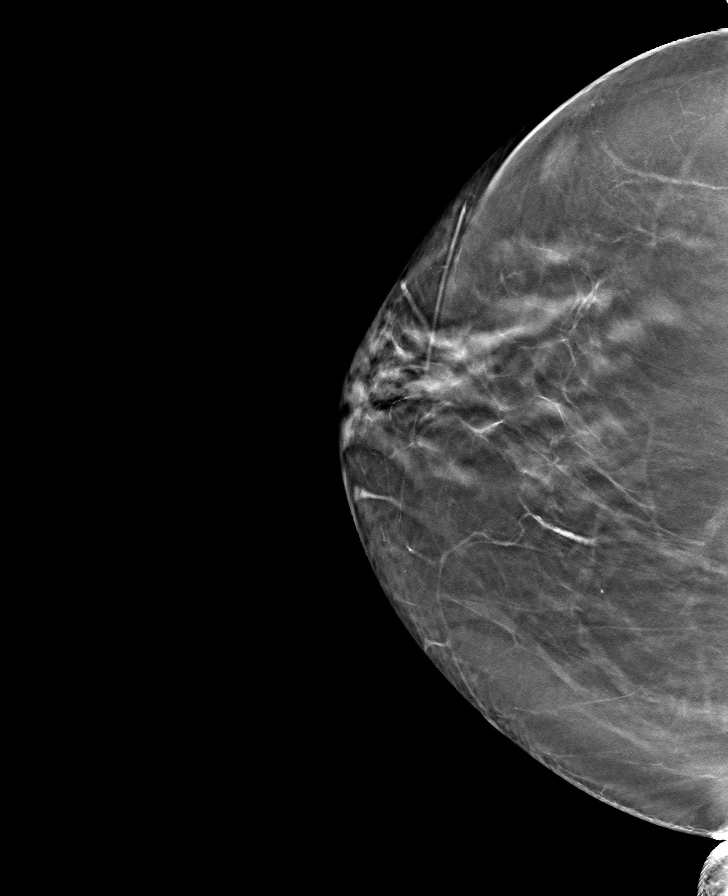

[8 of 24 positions shown; findings below may reference images not displayed]

ACR Breast Density Category c: The breast tissue is heterogeneously
dense, which may obscure small masses.
FINDINGS: There are no findings suspicious for malignancy. Images were
processed with CAD.
IMPRESSION: No mammographic evidence of malignancy. A result letter of this
screening mammogram will be mailed directly to the patient.

RECOMMENDATION:
Screening mammogram in one year. (Code:[id number])

BI-RADS CATEGORY  1: Negative.

## 2020-08-16 ENCOUNTER — Other Ambulatory Visit: Payer: Self-pay | Admitting: Internal Medicine

## 2020-08-16 ENCOUNTER — Other Ambulatory Visit: Payer: Self-pay | Admitting: Neurosurgery

## 2020-08-16 DIAGNOSIS — F419 Anxiety disorder, unspecified: Secondary | ICD-10-CM

## 2020-08-16 NOTE — Telephone Encounter (Signed)
Surgery in the morning.   She needs her ALPRAZolam (XANAX) 1 MG tablet refilled  [id number] - [place], [place] - [name] Phone:  [phone]  Fax:  [phone]

## 2020-08-17 ENCOUNTER — Ambulatory Visit: Payer: Medicare Other | Admitting: Cardiovascular Disease

## 2020-08-17 MED ORDER — ALPRAZOLAM 1 MG PO TABS: 1.0000 mg | ORAL_TABLET | Freq: Two times a day (BID) | ORAL | 1 refills | Status: DC

## 2020-08-17 NOTE — Addendum Note (Signed)
Addended by: Westley Hummer B on: 08/17/2020 08:28 AM   Modules accepted: Orders

## 2020-08-18 ENCOUNTER — Encounter (HOSPITAL_COMMUNITY): Payer: Self-pay | Admitting: Vascular Surgery

## 2020-08-18 NOTE — Progress Notes (Signed)
Anesthesia Chart Review: Kathy [name]   Case: [id number] Date/Time: 08/19/20 1415   Procedure: KYPHOPLASTY L1 (Bilateral ) - 3C   Anesthesia type: General   Pre-op diagnosis: COMPRESSION FRACTURE OF LUMBAR 1 VERTEBRA   Location: [facility] OR ROOM 19 / [facility]   Surgeons: Vallarie Mare, MD      DISCUSSION: Patient is a 69 year old female scheduled for the above procedure. Admission 07/28/20-07/29/20 to [facility] following fall from recliner height five days prior with severe back pain and development of saddle paresthesias with radiation down RLE. Imaging revealed acute/subacute L1 fracture. BLE [name] negative for DVT. Back brace and out-patient follow-up arranged.   History includes never smoker, HTN, murmur (reported MVP; mild MR, mild-moderate TR 01/2020), chronic diastolic CHF, PAF (88/2800, presented with afib with RVR, troponin peaked 4.96 believed related to demand ischemia, LHC showed mild CAD, 30% RCA), pulmonary hypertension (PASP 61 mmHg 01/2020), OSA (CPAP), hiatal hernia, GERD, IBS, fibromyalgia, syncope (~ 2000, 02/03/20 felt likely due to afib with RVR, admitted to [facility]), DVT (08/2004 post right knee arthroscopy 08/31/04), THA (left 02/21/10; right 03/18/12), morbid obesity.    Preoperative cardiology input outlined by Sande Rives, PA-C on 08/10/20: "Patient was last seen by Dr. Acie Fredrickson in 02/2020 at which time she was doing well from a cardiac standpoint. Patient was contacted today for additional pre-op evaluation. She is having significant back pain but is stable from a cardiac standpoint. She has chronic shortness of breath that is stable. No orthopnea or PND. Chronic lower extremity edema is stable. She continues to have occasional brief episodes of atypical chest pain that is unchanged from the past. No exertional chest pain or anything that sounds like angina. She has occasional brief episodes of palpitations but nothing sustained. No syncope. Prior to back recent back  injury, she was able to complete >4.0 METS of activity. Given past medical history and time since last visit, based on ACC/AHA guidelines, Kathy [name] would be at acceptable risk for the planned procedure without further cardiovascular testing.   Per Pharmacy and office protocol: Patient can holdEliquisfor 3days prior to procedure. Patient will not need bridging with Lovenox (enoxaparin) around procedure. If not bridging, patient should restartEliquison the evening of procedure or day after, at discretion of procedure MD."  Preoperative COVID-19 test has yet to be completed. If not done, plan day of procedure as indicated. Anesthesia team to evaluate day of surgery.    VS: For day of surgery. As of 07/29/20 ([facility]): Blood Pressure 123/48 07/29/2020 8:25 AM EST  Pulse 57 07/29/2020 8:25 AM EST  Temperature 36.7 C (98.1 F) 07/29/2020 8:25 AM EST  Respiratory Rate 21 07/29/2020 8:25 AM EST  Oxygen Saturation 100% 07/29/2020 8:25 AM EST  Weight 137.8 kg (303 lb 12.8 oz) 07/29/2020 3:30 AM EST  Height 170.2 cm (5\' 7" ) 07/28/2020 1:52 PM EST  Body Mass Index 47.58 07/28/2020 1:52 PM EST    PROVIDERS: Isaac Bliss, Rayford Halsted, MD is PCP  Mertie Moores, MD is cardiologist. Last visit 03/08/20 following hospital admission in [place] for syncope in setting of afib with RVR. Diltiazem was added, and she was doing much better. No angina. Pulmonary hypertension stable. She had lost 12 pounds since last visit. Six month follow-up planned. Next visit scheduled for 10/07/20.    LABS: For day of procedure. Last labs from 07/28/20 ([facility]) show WBC 9.3, H/H 13.1/40.6, PLT 194, overall normal CMET except glucose 104, and A1c 5.3%. TSH 2.131 on 02/03/20.  IMAGES: 1V CXR 07/28/20 ([facility]): FINDINGS:  The cardiomediastinal silhouette is unchanged. Interstitial opacities bilaterally. No focal airspace consolidation, pleural effusion or pneumothorax. No acute bone  abnormality. IMPRESSION:  INTERSTITIAL OPACITIES BILATERALLY.   MRI L-spine 08/07/20 ([facility]): IMPRESSION:  - ACUTE/SUBACUTE NONDISPLACED L1 SUPERIOR ENDPLATE FRACTURE SIMILAR TO CT ON 07/23/2020.  - 5 MM MEDIAL LEFT FACET JOINT SYNOVIAL CYST AT L3-4 ABUTTING THE DESCENDING LEFT L5 NERVE ROOTS.  - NO CENTRAL CANAL STENOSIS OR FORAMINAL NARROWING.  - MULTILEVEL BILATERAL FACET ARTHROPATHY WORST AT L3-4 AND L4-5.    EKG:  EKG 07/28/20 ([place]): Per Results Narrative: Diagnosis Sinus rhythm with frequent premature ventricular complexes and premature atrial complexes  Cannot rule out Inferior infarct , age undetermined  Abnormal ECG  When compared with ECG of 18-Jul-2018 18:54,  Significant changes have occurred  Hadassah Pais [phone]) on 44/11/4740 5:95:63 PM certifies that he/she has reviewed the ECG tracing and confirms the independent interpretation is  correct.  EKG 03/08/20 ([facility]): SB at 59 bpm   CV: Echo 02/05/20 ([place]): Summary  Atrial fibrillation is present.  There is abnormal septal motion consistent with interventricular  conduction delay.  The estimated left ventricular ejection fraction is 55%.  There is no left ventricular hypertrophy.  The left ventricle is normal in size.  There is mild mitral regurgitation.  Mild sclerosis of the trileaflet aortic valve, with adequate leaflet  motion.  There is mild to moderate tricuspid regurgitation.  Pulmonary artery systolic pressure estimated from the TR jet is 61 mmHg  consistent with severe pulmonary hypertension.  The left atrium is mildly enlarged.  The right atrium is mildly enlarged.  There is borderline right ventricular function.  There is moderate right ventricular enlargement.  The inferior vena cava is mildly dilated.  (Comparison: 11/05/18 EF 60-65%, mild MR, moderate TR, RVSP 56 mmHg; 06/26/18 RVSP 84 mmHg)  Cardiac cath 06/26/18:  Ost RCA lesion is  30% stenosed.  The left ventricular systolic function is normal.  LV end diastolic pressure is mildly elevated.  The left ventricular ejection fraction is 55-65% by visual estimate.   1.  Mild nonobstructive coronary artery disease. 2.  Normal LV systolic function and mildly elevated left ventricular end-diastolic pressure. - Recommendations: Elevated troponin most likely due to supply demand ischemia related to A. fib with RVR. Oral anticoagulation for A. fib can be started tomorrow.    Past Medical History:  Diagnosis Date  . Adenomatous colon polyp    2012  . Anxiety   . Anxiety and depression   . Arthritis    Osteoarthritis, Dr Maureen Ralphs  . Chronic diastolic CHF (congestive heart failure) ([place])   . DDD (degenerative disc disease), cervical   . Depression   . Diverticulosis   . DVT (deep venous thrombosis) ([place])    RLE DVT 08/2004, post right knee arthroscopy  . Fibromyalgia    Dr Tobie Lords, Rheu  . GERD (gastroesophageal reflux disease)   . H/O hiatal hernia   . Headache(784.0)    PAST HX MILD MIGRAINES  . Heart murmur    mitral valve prolapse--no symptoms  . History of colonic polyps 08/08/2011       . HTN (hypertension)   . IBS (irritable bowel syndrome)   . Internal hemorrhoid   . Lumbar radiculopathy   . Obesity   . OSA on CPAP    Moderate with AHI 20/hr with nocturnal hypoxemia on CPAP  . PAF (paroxysmal atrial fibrillation) ([place]) 06/2018  .  Pulmonary hypertension ([place])   . Syncope    idiopathic, ? related to migraines or hyperventilation. Dr Leonie Man, Neurology, YEARS AGO; 02/03/20 in setting of afib with RVR    Past Surgical History:  Procedure Laterality Date  . ABDOMINAL HYSTERECTOMY     dysfunctional menses  . APPENDECTOMY    . BREAST EXCISIONAL BIOPSY Right   . CARDIAC CATHETERIZATION  06/26/2018  . CHOLECYSTECTOMY    . COLONOSCOPY  05/27/2002, 08/08/11   2003 diverticulosis, hemorrhoids 2012 same + cecal polyp  . KNEE ARTHROSCOPY  2008   right   . LEFT HEART CATH AND CORONARY ANGIOGRAPHY N/A 06/26/2018   Procedure: LEFT HEART CATH AND CORONARY ANGIOGRAPHY;  Surgeon: Wellington Hampshire, MD;  Location: [facility];  Service: Cardiovascular;  Laterality: N/A;  . MELANOMA EXCISION Left 07/2013   foot  . TOTAL HIP ARTHROPLASTY  02/2010   left ; Dr Maureen Ralphs  . TOTAL HIP ARTHROPLASTY  03/18/2012   Procedure: TOTAL HIP ARTHROPLASTY;  Surgeon: Gearlean Alf, MD;  Location: [facility];  Service: Orthopedics;  Laterality: Right;  . UPPER GASTROINTESTINAL ENDOSCOPY  04/21/2009, 08/08/11   2010 gastritis ;2012 small hiatal hernia. Dr Carlean Purl    MEDICATIONS: No current facility-administered medications for this encounter.   Marland Kitchen atorvastatin (LIPITOR) 40 MG tablet  . clindamycin (CLEOCIN T) 1 % lotion  . clobetasol (TEMOVATE) 0.05 % external solution  . [employer] PURELAX 17 g packet  . ELIQUIS 5 MG TABS tablet  . fluocinonide (LIDEX) 0.05 % external solution  . FLUoxetine (PROZAC) 40 MG capsule  . furosemide (LASIX) 40 MG tablet  . HYDROcodone-acetaminophen (NORCO/VICODIN) 5-325 MG tablet  . methocarbamol (ROBAXIN) 500 MG tablet  . metoprolol succinate (TOPROL-XL) 100 MG 24 hr tablet  . metroNIDAZOLE (METROCREAM) 0.75 % cream  . Multiple Vitamin (MULTIVITAMIN WITH MINERALS) TABS tablet  . omeprazole (PRILOSEC) 40 MG capsule  . potassium chloride (KLOR-CON) 10 MEQ tablet  . ALPRAZolam (XANAX) 1 MG tablet  . Calcium Carbonate (CALCIUM 600 PO)  . cyclobenzaprine (FLEXERIL) 10 MG tablet  . diltiazem (CARDIZEM CD) 120 MG 24 hr capsule  . olmesartan (BENICAR) 20 MG tablet  . olmesartan (BENICAR) 40 MG tablet    Myra Gianotti, PA-C Surgical Short Stay/Anesthesiology [facility] Phone [phone] [facility] Phone [phone] 08/18/2020 10:55 AM

## 2020-08-18 NOTE — Progress Notes (Signed)
PCP - Dr Lelon Frohlich Cardiologist - Dr Agnes Lawrence - Dr Silvano Rusk  Chest x-ray - 07/28/20 [facility] EKG - 03/08/20, 07/28/20 [facility] Stress Test - 02/12/16 ECHO - 11/05/18, 5/20/21CE Cardiac Cath - 06/26/18  Sleep Study -  Yes CPAP - uses cpap  Anesthesia review: Yes  Last dose of eliquis was on 08/15/20.  STOP now taking any Aspirin (unless otherwise instructed by your surgeon), Aleve, Naproxen, Ibuprofen, Motrin, Advil, Goody's, BC's, all herbal medications, fish oil, and all vitamins.   Coronavirus Screening Covid test on DOS Do you have any of the following symptoms:  Cough yes/no: No Fever (>100.23F)  yes/no: No Runny nose yes/no: No Sore throat yes/no: No Difficulty breathing/shortness of breath  yes/no: No  Have you traveled in the last 14 days and where? yes/no: No  Patient verbalized understanding of instructions that were given via phone.

## 2020-08-18 NOTE — Anesthesia Preprocedure Evaluation (Addendum)
Anesthesia Evaluation  Patient identified by MRN, date of birth, ID band Patient awake    Reviewed: Patient's Chart, lab work & pertinent test results  Airway Mallampati: II  TM Distance: >3 FB Neck ROM: Full    Dental  (+) Teeth Intact   Pulmonary sleep apnea and Continuous Positive Airway Pressure Ventilation ,    Pulmonary exam normal        Cardiovascular hypertension, Pt. on medications and Pt. on home beta blockers +CHF  + dysrhythmias Atrial Fibrillation + Valvular Problems/Murmurs MVP  Rhythm:Regular Rate:Normal  Echo 02/05/20 ([place]): Summary  Atrial fibrillation is present.  There is abnormal septal motion consistent with interventricular  conduction delay.  The estimated left ventricular ejection fraction is 55%.  There is no left ventricular hypertrophy.  The left ventricle is normal in size.  There is mild mitral regurgitation.  Mild sclerosis of the trileaflet aortic valve, with adequate leaflet  motion.  There is mild to moderate tricuspid regurgitation.  Pulmonary artery systolic pressure estimated from the TR jet is 61 mmHg  consistent with severe pulmonary hypertension.  The left atrium is mildly enlarged.  The right atrium is mildly enlarged.  There is borderline right ventricular function.  There is moderate right ventricular enlargement.  The inferior vena cava is mildly dilated.   Cardiac cath 06/26/18:  Ost RCA lesion is 30% stenosed.  The left ventricular systolic function is normal.  LV end diastolic pressure is mildly elevated.  The left ventricular ejection fraction is 55-65% by visual estimate.    Neuro/Psych  Headaches, Anxiety Depression    GI/Hepatic Neg liver ROS, hiatal hernia, GERD  Medicated,  Endo/Other  negative endocrine ROS  Renal/GU Stone history  negative genitourinary   Musculoskeletal  (+) Arthritis , Fibromyalgia -L1 compression fx    Abdominal (+)  Abdomen: soft. Bowel sounds: normal.  Peds  Hematology  (+) anemia ,   Anesthesia Other Findings   Reproductive/Obstetrics                           Anesthesia Physical Anesthesia Plan  ASA: III  Anesthesia Plan: General   Post-op Pain Management:    Induction: Intravenous  PONV Risk Score and Plan: 3 and Ondansetron, Dexamethasone and Treatment may vary due to age or medical condition  Airway Management Planned: Mask and Oral ETT  Additional Equipment: None  Intra-op Plan:   Post-operative Plan: Extubation in OR  Informed Consent: I have reviewed the patients History and Physical, chart, labs and discussed the procedure including the risks, benefits and alternatives for the proposed anesthesia with the patient or authorized representative who has indicated his/her understanding and acceptance.     Dental advisory given  Plan Discussed with: CRNA  Anesthesia Plan Comments: (PAT note written 08/18/2020 by Myra Gianotti, PA-C. Lab Results      Component                Value               Date                      WBC                      5.0                 08/19/2020  HGB                      13.6                08/19/2020                HCT                      42.8                08/19/2020                MCV                      96.2                08/19/2020                PLT                      279                 08/19/2020          )       Anesthesia Quick Evaluation

## 2020-08-19 ENCOUNTER — Ambulatory Visit (HOSPITAL_COMMUNITY): Payer: Medicare Other | Admitting: Vascular Surgery

## 2020-08-19 ENCOUNTER — Encounter (HOSPITAL_COMMUNITY): Payer: Self-pay

## 2020-08-19 ENCOUNTER — Ambulatory Visit (HOSPITAL_COMMUNITY)
Admission: RE | Admit: 2020-08-19 | Discharge: 2020-08-19 | Disposition: A | Discharge status: Home / Self Care | Payer: Medicare Other | Attending: Neurosurgery | Admitting: Neurosurgery

## 2020-08-19 ENCOUNTER — Ambulatory Visit (HOSPITAL_COMMUNITY): Payer: Medicare Other

## 2020-08-19 ENCOUNTER — Other Ambulatory Visit: Payer: Self-pay

## 2020-08-19 ENCOUNTER — Encounter (HOSPITAL_COMMUNITY)
Admission: RE | Disposition: A | Discharge status: Home / Self Care | Payer: Self-pay | Source: Home / Self Care | Attending: Neurosurgery

## 2020-08-19 DIAGNOSIS — Z20822 Contact with and (suspected) exposure to covid-19: Secondary | ICD-10-CM | POA: Diagnosis not present

## 2020-08-19 DIAGNOSIS — W19XXXA Unspecified fall, initial encounter: Secondary | ICD-10-CM | POA: Diagnosis not present

## 2020-08-19 DIAGNOSIS — S32019A Unspecified fracture of first lumbar vertebra, initial encounter for closed fracture: Secondary | ICD-10-CM | POA: Insufficient documentation

## 2020-08-19 DIAGNOSIS — Y92009 Unspecified place in unspecified non-institutional (private) residence as the place of occurrence of the external cause: Secondary | ICD-10-CM | POA: Diagnosis not present

## 2020-08-19 DIAGNOSIS — Z419 Encounter for procedure for purposes other than remedying health state, unspecified: Secondary | ICD-10-CM

## 2020-08-19 HISTORY — DX: Malignant (primary) neoplasm, unspecified: C80.1

## 2020-08-19 HISTORY — PX: KYPHOPLASTY: SHX5884

## 2020-08-19 HISTORY — DX: Personal history of urinary calculi: Z87.442

## 2020-08-19 HISTORY — DX: Chronic diastolic (congestive) heart failure: I50.32

## 2020-08-19 HISTORY — DX: Unspecified hearing loss, right ear: H91.91

## 2020-08-19 HISTORY — DX: Pulmonary hypertension, unspecified: I27.20

## 2020-08-19 HISTORY — DX: Dyspnea, unspecified: R06.00

## 2020-08-19 HISTORY — DX: Acute embolism and thrombosis of unspecified deep veins of unspecified lower extremity: I82.409

## 2020-08-19 HISTORY — DX: Cardiac arrhythmia, unspecified: I49.9

## 2020-08-19 LAB — CBC
HCT: 42.8 % (ref 36.0–46.0)
Hemoglobin: 13.6 g/dL (ref 12.0–15.0)
MCH: 30.6 pg (ref 26.0–34.0)
MCHC: 31.8 g/dL (ref 30.0–36.0)
MCV: 96.2 fL (ref 80.0–100.0)
Platelets: 279 10*3/uL (ref 150–400)
RBC: 4.45 MIL/uL (ref 3.87–5.11)
RDW: 13.1 % (ref 11.5–15.5)
WBC: 5 10*3/uL (ref 4.0–10.5)
nRBC: 0 % (ref 0.0–0.2)

## 2020-08-19 LAB — BASIC METABOLIC PANEL
Anion gap: 13 (ref 5–15)
BUN: 14 mg/dL (ref 8–23)
CO2: 23 mmol/L (ref 22–32)
Calcium: 9.2 mg/dL (ref 8.9–10.3)
Chloride: 102 mmol/L (ref 98–111)
Creatinine, Ser: 0.88 mg/dL (ref 0.44–1.00)
GFR, Estimated: >60 mL/min (ref >60)
Glucose, Bld: 105 mg/dL — ABNORMAL HIGH (ref 70–99)
Potassium: 4 mmol/L (ref 3.5–5.1)
Sodium: 138 mmol/L (ref 135–145)

## 2020-08-19 LAB — SARS CORONAVIRUS 2 BY RT PCR (HOSPITAL ORDER, PERFORMED IN ~~LOC~~ HOSPITAL LAB): SARS Coronavirus 2: NEGATIVE

## 2020-08-19 LAB — SURGICAL PCR SCREEN
MRSA, PCR: NEGATIVE
Staphylococcus aureus: NEGATIVE

## 2020-08-19 SURGERY — KYPHOPLASTY
Anesthesia: General | Site: Spine Lumbar | Laterality: Bilateral

## 2020-08-19 MED ORDER — HYDROMORPHONE HCL 1 MG/ML IJ SOLN
0.2500 mg | INTRAMUSCULAR | Status: DC | PRN
  Administered 2020-08-19 (×2): 0.5 mg via INTRAVENOUS

## 2020-08-19 MED ORDER — PHENYLEPHRINE HCL-NACL 10-0.9 MG/250ML-% IV SOLN
INTRAVENOUS | Status: DC | PRN
  Administered 2020-08-19: 50 ug/min via INTRAVENOUS

## 2020-08-19 MED ORDER — LIDOCAINE 2% (20 MG/ML) 5 ML SYRINGE
INTRAMUSCULAR | Status: DC | PRN
  Administered 2020-08-19: 100 mg via INTRAVENOUS

## 2020-08-19 MED ORDER — EPHEDRINE SULFATE-NACL 50-0.9 MG/10ML-% IV SOSY
PREFILLED_SYRINGE | INTRAVENOUS | Status: DC | PRN
  Administered 2020-08-19: 15 mg via INTRAVENOUS

## 2020-08-19 MED ORDER — LIDOCAINE HCL (PF) 2 % IJ SOLN
INTRAMUSCULAR | Status: AC
  Filled 2020-08-19: qty 5

## 2020-08-19 MED ORDER — ROCURONIUM BROMIDE 10 MG/ML (PF) SYRINGE
PREFILLED_SYRINGE | INTRAVENOUS | Status: DC | PRN
  Administered 2020-08-19: 70 mg via INTRAVENOUS

## 2020-08-19 MED ORDER — 0.9 % SODIUM CHLORIDE (POUR BTL) OPTIME
TOPICAL | Status: DC | PRN
  Administered 2020-08-19: 1000 mL

## 2020-08-19 MED ORDER — ONDANSETRON HCL 4 MG/2ML IJ SOLN
INTRAMUSCULAR | Status: DC | PRN
  Administered 2020-08-19: 4 mg via INTRAVENOUS

## 2020-08-19 MED ORDER — FENTANYL CITRATE (PF) 250 MCG/5ML IJ SOLN
INTRAMUSCULAR | Status: AC
  Filled 2020-08-19: qty 5

## 2020-08-19 MED ORDER — BUPIVACAINE HCL (PF) 0.5 % IJ SOLN
INTRAMUSCULAR | Status: AC
  Filled 2020-08-19: qty 30

## 2020-08-19 MED ORDER — DEXAMETHASONE SODIUM PHOSPHATE 10 MG/ML IJ SOLN
INTRAMUSCULAR | Status: DC | PRN
  Administered 2020-08-19: 10 mg via INTRAVENOUS

## 2020-08-19 MED ORDER — ALBUMIN HUMAN 5 % IV SOLN: INTRAVENOUS | Status: DC | PRN

## 2020-08-19 MED ORDER — PHENYLEPHRINE 40 MCG/ML (10ML) SYRINGE FOR IV PUSH (FOR BLOOD PRESSURE SUPPORT)
PREFILLED_SYRINGE | INTRAVENOUS | Status: DC | PRN
  Administered 2020-08-19 (×4): 200 ug via INTRAVENOUS

## 2020-08-19 MED ORDER — CHLORHEXIDINE GLUCONATE CLOTH 2 % EX PADS: 6.0000 | MEDICATED_PAD | Freq: Once | CUTANEOUS | Status: DC

## 2020-08-19 MED ORDER — MIDAZOLAM HCL 2 MG/2ML IJ SOLN
INTRAMUSCULAR | Status: AC
  Filled 2020-08-19: qty 2

## 2020-08-19 MED ORDER — CHLORHEXIDINE GLUCONATE 0.12 % MT SOLN
15.0000 mL | Freq: Once | OROMUCOSAL | Status: AC
  Administered 2020-08-19: 15 mL via OROMUCOSAL
  Filled 2020-08-19: qty 15

## 2020-08-19 MED ORDER — LIDOCAINE-EPINEPHRINE 1 %-1:100000 IJ SOLN
INTRAMUSCULAR | Status: AC
  Filled 2020-08-19: qty 1

## 2020-08-19 MED ORDER — ONDANSETRON HCL 4 MG/2ML IJ SOLN: 4.0000 mg | Freq: Once | INTRAMUSCULAR | Status: DC | PRN

## 2020-08-19 MED ORDER — PROPOFOL 10 MG/ML IV BOLUS
INTRAVENOUS | Status: AC
  Filled 2020-08-19: qty 20

## 2020-08-19 MED ORDER — PROPOFOL 10 MG/ML IV BOLUS
INTRAVENOUS | Status: DC | PRN
  Administered 2020-08-19: 170 mg via INTRAVENOUS

## 2020-08-19 MED ORDER — LACTATED RINGERS IV SOLN: INTRAVENOUS | Status: DC

## 2020-08-19 MED ORDER — MIDAZOLAM HCL 2 MG/2ML IJ SOLN
INTRAMUSCULAR | Status: DC | PRN
  Administered 2020-08-19: 2 mg via INTRAVENOUS

## 2020-08-19 MED ORDER — LIDOCAINE-EPINEPHRINE 1 %-1:100000 IJ SOLN
INTRAMUSCULAR | Status: DC | PRN
  Administered 2020-08-19: 5 mL

## 2020-08-19 MED ORDER — HYDROMORPHONE HCL 1 MG/ML IJ SOLN
INTRAMUSCULAR | Status: AC
  Filled 2020-08-19: qty 1

## 2020-08-19 MED ORDER — ACETAMINOPHEN 10 MG/ML IV SOLN: 1000.0000 mg | Freq: Once | INTRAVENOUS | Status: DC | PRN

## 2020-08-19 MED ORDER — FENTANYL CITRATE (PF) 250 MCG/5ML IJ SOLN
INTRAMUSCULAR | Status: DC | PRN
  Administered 2020-08-19: 50 ug via INTRAVENOUS
  Administered 2020-08-19 (×2): 100 ug via INTRAVENOUS

## 2020-08-19 MED ORDER — ORAL CARE MOUTH RINSE: 15.0000 mL | Freq: Once | OROMUCOSAL | Status: AC

## 2020-08-19 MED ORDER — VANCOMYCIN HCL 1500 MG/300ML IV SOLN
1500.0000 mg | INTRAVENOUS | Status: AC
  Administered 2020-08-19: 1500 mg via INTRAVENOUS
  Filled 2020-08-19: qty 300

## 2020-08-19 MED ORDER — IOHEXOL 300 MG/ML  SOLN
INTRAMUSCULAR | Status: DC | PRN
  Administered 2020-08-19: 50 mL

## 2020-08-19 MED ORDER — BUPIVACAINE HCL (PF) 0.5 % IJ SOLN
INTRAMUSCULAR | Status: DC | PRN
  Administered 2020-08-19: 5 mL

## 2020-08-19 MED ORDER — HYDROCODONE-ACETAMINOPHEN 5-325 MG PO TABS: 1.0000 | ORAL_TABLET | Freq: Four times a day (QID) | ORAL | 0 refills | Status: DC | PRN

## 2020-08-19 MED ORDER — SUGAMMADEX SODIUM 200 MG/2ML IV SOLN
INTRAVENOUS | Status: DC | PRN
  Administered 2020-08-19: 300 mg via INTRAVENOUS

## 2020-08-19 SURGICAL SUPPLY — 43 items
ADH SKN CLS APL DERMABOND .7 (GAUZE/BANDAGES/DRESSINGS) ×1
APL SKNCLS STERI-STRIP NONHPOA (GAUZE/BANDAGES/DRESSINGS)
BENZOIN TINCTURE PRP APPL 2/3 (GAUZE/BANDAGES/DRESSINGS) IMPLANT
BLADE 15 SAFETY STRL DISP (BLADE) ×1 IMPLANT
BLADE CLIPPER SURG (BLADE) IMPLANT
CEMENT KYPHON CX01A KIT/MIXER (Cement) ×1 IMPLANT
CNTNR URN SCR LID CUP LEK RST (MISCELLANEOUS) IMPLANT
CONT SPEC 4OZ STRL OR WHT (MISCELLANEOUS) ×2
COVER WAND RF STERILE (DRAPES) ×1 IMPLANT
DECANTER SPIKE VIAL GLASS SM (MISCELLANEOUS) ×3 IMPLANT
DERMABOND ADVANCED (GAUZE/BANDAGES/DRESSINGS) ×1
DERMABOND ADVANCED .7 DNX12 (GAUZE/BANDAGES/DRESSINGS) IMPLANT
DRAPE C-ARM 42X72 X-RAY (DRAPES) ×2 IMPLANT
DRAPE LAPAROTOMY 100X72X124 (DRAPES) ×2 IMPLANT
DRAPE SURG 17X23 STRL (DRAPES) ×2 IMPLANT
DRAPE WARM FLUID 44X44 (DRAPES) ×2 IMPLANT
DRSG OPSITE POSTOP 3X4 (GAUZE/BANDAGES/DRESSINGS) ×2 IMPLANT
DURAPREP 26ML APPLICATOR (WOUND CARE) ×2 IMPLANT
GAUZE 4X4 16PLY RFD (DISPOSABLE) IMPLANT
GLOVE BIOGEL PI IND STRL 6.5 (GLOVE) IMPLANT
GLOVE BIOGEL PI IND STRL 7.5 (GLOVE) ×1 IMPLANT
GLOVE BIOGEL PI INDICATOR 6.5 (GLOVE) ×1
GLOVE BIOGEL PI INDICATOR 7.5 (GLOVE) ×1
GLOVE ECLIPSE 7.5 STRL STRAW (GLOVE) ×2 IMPLANT
GLOVE SURG SS PI 6.0 STRL IVOR (GLOVE) ×2 IMPLANT
GOWN STRL REUS W/ TWL LRG LVL3 (GOWN DISPOSABLE) IMPLANT
GOWN STRL REUS W/ TWL XL LVL3 (GOWN DISPOSABLE) ×1 IMPLANT
GOWN STRL REUS W/TWL 2XL LVL3 (GOWN DISPOSABLE) IMPLANT
GOWN STRL REUS W/TWL LRG LVL3 (GOWN DISPOSABLE) ×2
GOWN STRL REUS W/TWL XL LVL3 (GOWN DISPOSABLE) ×2
KIT BASIN OR (CUSTOM PROCEDURE TRAY) ×2 IMPLANT
KIT TURNOVER KIT B (KITS) ×2 IMPLANT
MARKER SKIN DUAL TIP RULER LAB (MISCELLANEOUS) ×3 IMPLANT
NEEDLE HYPO 22GX1.5 SAFETY (NEEDLE) ×2 IMPLANT
NS IRRIG 1000ML POUR BTL (IV SOLUTION) ×2 IMPLANT
PACK LAMINECTOMY NEURO (CUSTOM PROCEDURE TRAY) ×2 IMPLANT
PAD ARMBOARD 7.5X6 YLW CONV (MISCELLANEOUS) ×10 IMPLANT
STRIP CLOSURE SKIN 1/4X4 (GAUZE/BANDAGES/DRESSINGS) IMPLANT
SUT VICRYL RAPIDE 4/0 PS 2 (SUTURE) ×3 IMPLANT
TOWEL GREEN STERILE (TOWEL DISPOSABLE) ×2 IMPLANT
TOWEL GREEN STERILE FF (TOWEL DISPOSABLE) ×2 IMPLANT
TRAY KYPHOPAK 15/3 ONESTEP 1ST (MISCELLANEOUS) ×1 IMPLANT
WATER STERILE IRR 1000ML POUR (IV SOLUTION) ×2 IMPLANT

## 2020-08-19 NOTE — Progress Notes (Signed)
Dr. Marcello Moores asked to sign his surgical orders so they can be released.

## 2020-08-19 NOTE — H&P (Signed)
CC: Back pain  HPI:     Patient is a 69 y.o. female with CAD, Afib, CHF, OSA who developed severe back pain after a fall she suffered at home.  The pain has persisted for several weeks despite medical management, brace therapy.  She has occasional numbness symptoms but otherwise no neurologic issues.    Patient Active Problem List   Diagnosis Date Noted  . OSA on CPAP   . Depression, recurrent ([place]) 05/06/2019  . Persistent atrial fibrillation ([place]) 12/31/2018  . Chronic diastolic CHF (congestive heart failure) ([place]) 12/31/2018  . Bilateral leg edema 07/29/2018  . Paroxysmal atrial fibrillation ([place]) 06/25/2018  . Chest pain 06/25/2018  . Anxiety 06/25/2018  . Difficulty walking 02/13/2017  . Low back pain 02/13/2017  . Allergic rhinitis 07/31/2016  . Malignant melanoma ([place]) 08/31/2013  . Other abnormal glucose 01/22/2013  . Anemia, unspecified 01/22/2013  . OA (osteoarthritis) of hip 03/18/2012  . Personal history of DVT (deep vein thrombosis) 01/05/2012  . History of colonic polyps 08/08/2011  . ARTHRITIS, HIP 01/24/2010  . PERSONAL HISTORY OF FAILED MODERATE SEDATION 07/21/2009  . Morbid obesity ([place]) 04/05/2009  . Esophageal reflux 02/25/2009  . HEART VALVE DISEASE 12/28/2008  . HEADACHE 04/21/2008  . Essential hypertension 10/15/2007  . LUMBAR RADICULOPATHY, RIGHT 10/15/2007  . FATIGUE 10/15/2007  . IRRITABLE BOWEL SYNDROME 07/23/2007  . Fibromyalgia 07/23/2007   Past Medical History:  Diagnosis Date  . Adenomatous colon polyp    2012  . Anxiety   . Anxiety and depression   . Arthritis    Osteoarthritis, Dr Maureen Ralphs  . Cancer ([place])    skin - left foot excised  . Chronic diastolic CHF (congestive heart failure) ([place])   . DDD (degenerative disc disease), cervical   . Deaf, right   . Depression   . Diverticulosis   . DVT (deep venous thrombosis) ([place])    RLE DVT 08/2004, post right knee arthroscopy  . Dyspnea    occasional- no oxygen  . Dysrhythmia     a-fib  . Fibromyalgia    Dr Tobie Lords, Rheu  . GERD (gastroesophageal reflux disease)   . H/O hiatal hernia   . Headache(784.0)    PAST HX MILD MIGRAINES - resolved per patient 08/18/20  . Heart murmur    mitral valve prolapse--no symptoms  . History of colonic polyps 08/08/2011       . History of kidney stones    passed stones  . HTN (hypertension)   . IBS (irritable bowel syndrome)   . Internal hemorrhoid   . Lumbar radiculopathy   . Obesity   . OSA on CPAP    Moderate with AHI 20/hr with nocturnal hypoxemia on CPAP  . PAF (paroxysmal atrial fibrillation) ([place]) 06/2018  . Pulmonary hypertension ([place])   . Syncope    idiopathic, ? related to migraines or hyperventilation. Dr Leonie Man, Neurology, YEARS AGO; 02/03/20 in setting of afib with RVR    Past Surgical History:  Procedure Laterality Date  . ABDOMINAL HYSTERECTOMY     dysfunctional menses  . APPENDECTOMY    . BREAST EXCISIONAL BIOPSY Right   . CARDIAC CATHETERIZATION  06/26/2018  . CHOLECYSTECTOMY    . COLONOSCOPY  05/27/2002, 08/08/11   2003 diverticulosis, hemorrhoids 2012 same + cecal polyp  . KNEE ARTHROSCOPY  2008   right  . LEFT HEART CATH AND CORONARY ANGIOGRAPHY N/A 06/26/2018   Procedure: LEFT HEART CATH AND CORONARY ANGIOGRAPHY;  Surgeon: Wellington Hampshire, MD;  Location: [facility];  Service: Cardiovascular;  Laterality: N/A;  . MELANOMA EXCISION Left 07/2013   foot  . TOTAL HIP ARTHROPLASTY  02/2010   left ; Dr Maureen Ralphs  . TOTAL HIP ARTHROPLASTY  03/18/2012   Procedure: TOTAL HIP ARTHROPLASTY;  Surgeon: Gearlean Alf, MD;  Location: [facility];  Service: Orthopedics;  Laterality: Right;  . UPPER GASTROINTESTINAL ENDOSCOPY  04/21/2009, 08/08/11   2010 gastritis ;2012 small hiatal hernia. Dr Carlean Purl    Medications Prior to Admission  Medication Sig Dispense Refill Last Dose  . ALPRAZolam (XANAX) 1 MG tablet Take 1 tablet (1 mg total) by mouth 2 (two) times daily. 60 tablet 1 08/19/2020 at 0730  .  atorvastatin (LIPITOR) 40 MG tablet TAKE 1 TABLET (40 MG TOTAL) BY MOUTH DAILY AT 6 PM. 90 tablet 0 08/18/2020 at Unknown time  . Calcium Carbonate (CALCIUM 600 PO) Take 1 tablet by mouth daily.   08/18/2020 at Unknown time  . clindamycin (CLEOCIN T) 1 % lotion Apply 1 application topically 2 (two) times daily as needed (rosacea (skin irritation)).    Past Month at Unknown time  . clobetasol (TEMOVATE) 0.05 % external solution Apply 1 application topically daily as needed (rosacea).   Past Month at Unknown time  . ELIQUIS 5 MG TABS tablet TAKE 1 TABLET BY MOUTH TWICE A DAY (Patient taking differently: Take 5 mg by mouth 2 (two) times daily. ) 60 tablet 11 08/15/2020  . fluocinonide (LIDEX) 0.05 % external solution Apply 1 application topically daily as needed (rosacea).   Past Month at Unknown time  . FLUoxetine (PROZAC) 40 MG capsule TAKE 1 CAPSULE BY MOUTH EVERY DAY (Patient taking differently: Take 40 mg by mouth every evening. ) 90 capsule 0 08/18/2020 at Unknown time  . furosemide (LASIX) 40 MG tablet Take 1 tablet (40 mg total) by mouth daily. (Patient taking differently: Take 40 mg by mouth daily with lunch. ) 90 tablet 3 08/18/2020 at Unknown time  . HYDROcodone-acetaminophen (NORCO/VICODIN) 5-325 MG tablet Take 1 tablet by mouth every 6 (six) hours as needed for moderate pain.    08/19/2020 at 0430  . methocarbamol (ROBAXIN) 500 MG tablet Take 1,000 mg by mouth 4 (four) times daily as needed for spasms.   08/19/2020 at 0430  . metoprolol succinate (TOPROL-XL) 100 MG 24 hr tablet TAKE 1 TABLET BY MOUTH EVERY DAY (Patient taking differently: Take 100 mg by mouth daily. ) 90 tablet 0 08/19/2020 at 0730  . metroNIDAZOLE (METROCREAM) 0.75 % cream Apply 1 application topically 2 (two) times daily as needed (rosacea).    Past Month at Unknown time  . Multiple Vitamin (MULTIVITAMIN WITH MINERALS) TABS tablet Take 1 tablet by mouth daily. Centrum Silver   08/18/2020 at Unknown time  . omeprazole (PRILOSEC) 40  MG capsule TAKE 1 CAPSULE BY MOUTH EVERY DAY (Patient taking differently: Take 40 mg by mouth daily before breakfast. ) 90 capsule 1 08/19/2020 at 0730  . potassium chloride (KLOR-CON) 10 MEQ tablet TAKE 1 TABLET BY MOUTH EVERY DAY (Patient taking differently: Take 10 mEq by mouth every evening. ) 90 tablet 2 08/18/2020 at Unknown time  . CVS PURELAX 17 g packet Take 17 g by mouth daily as needed for constipation.   Unknown at Unknown time  . cyclobenzaprine (FLEXERIL) 10 MG tablet Take 1 tablet (10 mg total) by mouth 4 (four) times daily as needed. SPASMS 30 tablet 5   . diltiazem (CARDIZEM CD) 120 MG 24 hr capsule TAKE 1 CAPSULE BY MOUTH  EVERY DAY 90 capsule 1   . olmesartan (BENICAR) 20 MG tablet Take 1 tablet (20 mg total) by mouth daily. (Patient not taking: Reported on 08/16/2020) 30 tablet 11 Not Taking at Unknown time  . olmesartan (BENICAR) 40 MG tablet TAKE 1 TABLET BY MOUTH EVERY DAY (Patient not taking: Reported on 08/16/2020) 90 tablet 0 Not Taking at Unknown time   Allergies  Allergen Reactions  . Achromycin [Tetracycline Hcl] Shortness Of Breath    Dyspnea    . Penicillins     Facial angioedema Did it involve swelling of the face/tongue/throat, SOB, or low BP? Yes Did it involve sudden or severe rash/hives, skin peeling, or any reaction on the inside of your mouth or nose? No Did you need to seek medical attention at a hospital or doctor's office? No When did it last happen?30+ years If all above answers are "NO", may proceed with cephalosporin use.   . Sulfamethoxazole-Trimethoprim Hives  . Nickel Itching  . Zanaflex [Tizanidine Hydrochloride]     Causes more pain     Social History   Tobacco Use  . Smoking status: Never Smoker  . Smokeless tobacco: Never Used  Substance Use Topics  . Alcohol use: Yes    Comment: occasional    Family History  Problem Relation Age of Onset  . Heart attack Mother 35  . COPD Mother   . Breast cancer Mother 10  . Hypertension  Father   . Diabetes Father   . Heart failure Father 97  . Breast cancer Paternal Aunt        cns mets  . COPD Maternal Grandmother   . Heart disease Paternal Grandmother        died 105  . Diabetes Paternal Grandmother   . Stroke Paternal Grandfather        in late 48s  . Heart attack Brother 56  . Colon cancer Neg Hx      Review of Systems Pertinent items noted in HPI and remainder of comprehensive ROS otherwise negative.  Objective:   Patient Vitals for the past 8 hrs:  BP Temp Temp src Pulse Resp SpO2 Height Weight  08/19/20 0903 (!) 187/91 97.8 F (36.6 C) Temporal 94 19 94 % 5\' 7"  (1.702 m) 131.5 kg   No intake/output data recorded. No intake/output data recorded.    General : Alert, cooperative, no distress, appears stated age. Obese.   Head:  Normocephalic/atraumatic    Eyes: PERRL, conjunctiva/corneas clear, EOM's intact. Fundi could not be visualized Neck: Supple Chest:  Respirations unlabored Chest wall: no tenderness or deformity Heart: Regular rate and rhythm Abdomen: Soft, nontender and nondistended Extremities: warm and well-perfused Skin: normal turgor, color and texture Neurologic:  Alert, oriented x 3.  Eyes open spontaneously. PERRL, EOMI, VFC, no facial droop. V1-3 intact.  No dysarthria, tongue protrusion symmetric.  CNII-XII intact. Normal strength, sensation and reflexes throughout.  No pronator drift, full strength in legs         Data Review CBC:  Lab Results  Component Value Date   WBC 5.0 08/19/2020   RBC 4.45 08/19/2020   BMP:  Lab Results  Component Value Date   GLUCOSE 105 (H) 08/19/2020   CO2 23 08/19/2020   BUN 14 08/19/2020   BUN 13 11/12/2018   CREATININE 0.88 08/19/2020   CREATININE 0.85 12/01/2014   CALCIUM 9.2 08/19/2020   Radiology review: L1 compression fracture with severe amount of associated STIR signal change.  Degree of height loss increased on  recent standing x-rays compared with previous MRI.  Assessment:   Acute L1 compression fracture with persistent severe pain, worsened height loss on subsequent x-rays  Plan:   - plan for L1 kyphoplasty - This procedure has been fully reviewed with the patient and written informed consent has been obtained.

## 2020-08-19 NOTE — Anesthesia Procedure Notes (Signed)
Procedure Name: Intubation Date/Time: 08/19/2020 2:07 PM Performed by: Lance Coon, CRNA Pre-anesthesia Checklist: Patient identified, Emergency Drugs available, Suction available, Patient being monitored and Timeout performed Patient Re-evaluated:Patient Re-evaluated prior to induction Oxygen Delivery Method: Circle system utilized Preoxygenation: Pre-oxygenation with 100% oxygen Induction Type: IV induction Ventilation: Mask ventilation without difficulty Laryngoscope Size: Miller and 2 Grade View: Grade I Tube type: Oral Tube size: 7.0 mm Number of attempts: 1 Airway Equipment and Method: Stylet Placement Confirmation: ETT inserted through vocal cords under direct vision,  positive ETCO2 and breath sounds checked- equal and bilateral Secured at: 21 cm Tube secured with: Tape Dental Injury: Teeth and Oropharynx as per pre-operative assessment

## 2020-08-19 NOTE — Op Note (Signed)
PREOP DIAGNOSIS: L1 compression fx   POSTOP DIAGNOSIS: L1 compression fx   PROCEDURE: 1. L1 Kyphoplasty via bipedicular approach using Medtronic Kyphon system  SURGEON: Dr. Duffy Rhody, MD  ASSISTANT: None  ANESTHESIA: GETA  EBL: Minimal  SPECIMENS: None  DRAINS: None  COMPLICATIONS: None immediate  CONDITION: Hemodynamically stable to PCAU  HISTORY: Kathy [name] is a 69 y.o. yo female with obesity CAD, CHF who suffered a fall at home ~ 1 month ago.  She had severe back pain, and an acute L1 compression fracture was found.  Nonprocedural therapies with activity restriction, pain medications, and bracing were attempted but her pain did not improve.  On standing x-rays performed in clinic, there was significant worsening of vertebral body height loss compared with her MRI and CT.  I discussed with her treatment options including kyphoplasty at L1 to help with pain palliation and mobilization.  Risks, benefits, alternatives, and expected convalescence were discussed.  Risks discussed included but were not limited to bleeding, pain, infection, neurologic deficit, fracture progression, adjacent level fracture, extravasation, and death.  Informed consent was obtained.  PROCEDURE IN DETAIL: After informed consent was obtained and witnessed, the patient was brought to the operating room. After induction of anesthesia, the patient was positioned on the operative table in the prone position. All pressure points were meticulously padded. Fluoroscopy was used to mark out the projection of the L1 pedicles on the skin. Skin incision was then marked out and prepped and draped in the usual sterile fashion.  After time out was conducted, bilateral stab incisions were made and Jamshidi needles were introduced. Under AP and lateral fluoroscopic guidance, bilateral L1 pedicles were canulated. Drill was then used to create a channel and the kyphoplasty balloon was placed and expanded to create a  cavity. Approximately  1.5 cc of PMMA cement was injected in each side under fluoroscopy, taking care to preserve the posterior cortex. The balloons were then removed, replaced with the trocars, and the Jamshidi needles removed.  Stab incisions were then closed with 3-0 vicryl suture and standard skin glue. The patient was then transferred to the stretcher and taken to the PACU in stable hemodynamic condition.  At the end of the case all sponge, needle, and instrument counts were correct.

## 2020-08-19 NOTE — Transfer of Care (Signed)
Immediate Anesthesia Transfer of Care Note  Patient: Kathy [name]  Procedure(s) Performed: KYPHOPLASTY LUMBAR ONE (Bilateral Spine Lumbar)  Patient Location: PACU  Anesthesia Type:General  Level of Consciousness: drowsy and patient cooperative  Airway & Oxygen Therapy: Patient Spontanous Breathing  Post-op Assessment: Report given to RN and Post -op Vital signs reviewed and stable  Post vital signs: Reviewed and stable  Last Vitals:  Vitals Value Taken Time  BP 149/102 08/19/20 1526  Temp    Pulse    Resp 14 08/19/20 1529  SpO2    Vitals shown include unvalidated device data.  Last Pain:  Vitals:   08/19/20 1012  TempSrc:   PainSc: 7       Patients Stated Pain Goal: 3 (75/10/25 8527)  Complications: No complications documented.

## 2020-08-19 NOTE — Discharge Instructions (Addendum)
Walk as much as possible No heavy lifting >10 lbs No excessive bending/twisting at the waist Continue to wear TLSO brace as needed for pain

## 2020-08-20 ENCOUNTER — Encounter (HOSPITAL_COMMUNITY): Payer: Self-pay | Admitting: Neurosurgery

## 2020-08-22 NOTE — Anesthesia Postprocedure Evaluation (Signed)
Anesthesia Post Note  Patient: Kathy [name]  Procedure(s) Performed: KYPHOPLASTY LUMBAR ONE (Bilateral Spine Lumbar)     Patient location during evaluation: PACU Anesthesia Type: General Level of consciousness: awake and alert Pain management: pain level controlled Vital Signs Assessment: post-procedure vital signs reviewed and stable Respiratory status: spontaneous breathing, nonlabored ventilation, respiratory function stable and patient connected to nasal cannula oxygen Cardiovascular status: blood pressure returned to baseline and stable Postop Assessment: no apparent nausea or vomiting Anesthetic complications: no   No complications documented.  Last Vitals:  Vitals:   08/19/20 1642 08/19/20 1650  BP: (!) 142/84 (!) 153/91  Pulse: 79 91  Resp: 13 15  Temp:  36.7 C  SpO2: 99% 100%    Last Pain:  Vitals:   08/19/20 1530  TempSrc:   PainSc: 7                  Jahn Franchini S

## 2020-09-03 ENCOUNTER — Telehealth: Payer: Self-pay | Admitting: Internal Medicine

## 2020-09-03 NOTE — Telephone Encounter (Signed)
Colletta Maryland called the pt to do a [place] visit for today and the pt declined the visit stated that she has a lot going on with family and it is just not a good day and wanted to know if it is okay to reschedule for next week.  Per Colletta Maryland she was just letting the provider know per the contract.

## 2020-09-03 NOTE — Telephone Encounter (Signed)
fyi

## 2020-09-17 ENCOUNTER — Other Ambulatory Visit: Payer: Self-pay | Admitting: Internal Medicine

## 2020-09-17 DIAGNOSIS — E785 Hyperlipidemia, unspecified: Secondary | ICD-10-CM

## 2020-09-21 ENCOUNTER — Telehealth: Payer: Self-pay | Admitting: Internal Medicine

## 2020-09-21 NOTE — Telephone Encounter (Signed)
Refill sent.

## 2020-09-21 NOTE — Telephone Encounter (Signed)
Pt would like a refill for Atorvastatin (Lipitor) 40 mg  Send to: [id number] - [place], [place] - [name]

## 2020-10-02 ENCOUNTER — Other Ambulatory Visit: Payer: Self-pay | Admitting: Internal Medicine

## 2020-10-02 DIAGNOSIS — I480 Paroxysmal atrial fibrillation: Secondary | ICD-10-CM

## 2020-10-02 DIAGNOSIS — I10 Essential (primary) hypertension: Secondary | ICD-10-CM

## 2020-10-07 ENCOUNTER — Ambulatory Visit: Payer: Medicare Other | Admitting: Cardiovascular Disease

## 2020-10-11 ENCOUNTER — Encounter: Payer: Self-pay | Admitting: Cardiovascular Disease

## 2020-10-11 NOTE — Progress Notes (Signed)
Cardiology Office Note:    Date:  10/12/2020   ID:  Kathy [name], DOB 1951-06-26, MRN [id number]  PCP:  Isaac Bliss, Rayford Halsted, MD  Cardiologist:  Mertie Moores, MD  Electrophysiologist:  None   Referring MD: Isaac Bliss, [place]*   Problem List 1.  Chronic diastolic CHF 2. Morbid obesity 3. Severe pulmonary HTN 4. Mild CAD  5.   Chief Complaint  Patient presents with  . Atrial Fibrillation        Kathy [name] is a 70 y.o. female with a hx of  MVP, chest pain  Hyperlipidemia and atrial fib We are asked to see her by Isaac Bliss, Rayford Halsted, MD for further eval of her atrial fib  I have seen her many years ago .   Developed CP,  Called EMS ECG at home showed atrial fib  She was admitted to the hospital with mid sternal chest pain , atrial fib, HTN.  Troponin levels rose to 4.96.    cath showed mild irreg. CT angiogram of the chest showed no aortic dissection  Echo shows  Normal LV systolic function - severe concnetric LCH Grade 2 diastolic dysfunction   Severe pulmonary HTN with estimated PA pressure of 84.   Gained 16 lbs after her hospitalization Was started on Lasix by Dr. Sharlene Motts  BP is now under control.  Breathing seems to be better Does not do any exercise ( limited by orthopedic issues )    Feb. 25, 2020: Kathy [name] is seen back today for follow-up of her chronic diastolic congestive heart failure, paroxysmal atrial fibrillation and pulmonary hypertension.  Takes her BP and HR daily.   Is in atrial flutter today   Has been more short of breath for the past several days . Has started working part time.   Has had her 1st part of her sleep study .   Needs to go back for her CPAP titration  Is working on weight loss.  Still fluctuating .   Sept, 29, 2020 Kathy [name] is seen for follow up of her chronic diastolic CHF, severe pulmonary HTN, morbid obesity, Wt today is 295  ( up 9 lbs from last visit 6 months )  Has a bump  on the back of  her head   Breathing has been ok  Has not been exercising . And is drinking coke.    Is under some stress.   Husband has throat cancer.   06/21/2021[name] seen today for follow-up visit. She has a history of chronic diastolic CHF, severe pulmonary hypertension, morbid obesity and paroxysmal atrial fibrillation.  she was admitted with Atrial fib while in [place]  She got up to get some water and passed out on the way to the fridge. She felt like her HR was irreg.    EMS was called ,  Took her to the hospitial.  She was found to have atrial fib .  She converted on her own .  Did not reguire cardioversion.    Was Started on Diltiazem  Was started on Diltiazem and lasix was increased to 40 mg a day .  Is losing weight - 12 lbs since previoius office visit .   Jan. 25, 2022: Kathy [name] is seen today for follow up of her chronic diastolic CHF, severe pulmonary HTN, morbid obesity, and PAF  Wt today is 282 lbs  Still has episodes of PAF. - might last for a few minutes [address] and broke her back  - had kyphoplasty.  She  is still recovering  She will need another kyphoplasty in 6 weeks  Has some occasional chest twinges above her left breast  Has not been exercisng   Past Medical History:  Diagnosis Date  . Adenomatous colon polyp    2012  . Anxiety   . Anxiety and depression   . Arthritis    Osteoarthritis, Dr Maureen Ralphs  . Cancer ([place])    skin - left foot excised  . Chronic diastolic CHF (congestive heart failure) ([place])   . DDD (degenerative disc disease), cervical   . Deaf, right   . Depression   . Diverticulosis   . DVT (deep venous thrombosis) ([place])    RLE DVT 08/2004, post right knee arthroscopy  . Dyspnea    occasional- no oxygen  . Dysrhythmia    a-fib  . Fibromyalgia    Dr Tobie Lords, Rheu  . GERD (gastroesophageal reflux disease)   . H/O hiatal hernia   . Headache(784.0)    PAST HX MILD MIGRAINES - resolved per patient 08/18/20  . Heart murmur    mitral valve prolapse--no  symptoms  . History of colonic polyps 08/08/2011       . History of kidney stones    passed stones  . HTN (hypertension)   . IBS (irritable bowel syndrome)   . Internal hemorrhoid   . Lumbar radiculopathy   . Obesity   . OSA on CPAP    Moderate with AHI 20/hr with nocturnal hypoxemia on CPAP  . PAF (paroxysmal atrial fibrillation) ([place]) 06/2018  . Pulmonary hypertension ([place])   . Syncope    idiopathic, ? related to migraines or hyperventilation. Dr Leonie Man, Neurology, YEARS AGO; 02/03/20 in setting of afib with RVR    Past Surgical History:  Procedure Laterality Date  . ABDOMINAL HYSTERECTOMY     dysfunctional menses  . APPENDECTOMY    . BREAST EXCISIONAL BIOPSY Right   . CARDIAC CATHETERIZATION  06/26/2018  . CHOLECYSTECTOMY    . COLONOSCOPY  05/27/2002, 08/08/11   2003 diverticulosis, hemorrhoids 2012 same + cecal polyp  . KNEE ARTHROSCOPY  2008   right  . KYPHOPLASTY Bilateral 08/19/2020   Procedure: KYPHOPLASTY LUMBAR ONE;  Surgeon: Vallarie Mare, MD;  Location: [facility];  Service: Neurosurgery;  Laterality: Bilateral;  posterior  . LEFT HEART CATH AND CORONARY ANGIOGRAPHY N/A 06/26/2018   Procedure: LEFT HEART CATH AND CORONARY ANGIOGRAPHY;  Surgeon: Wellington Hampshire, MD;  Location: [facility];  Service: Cardiovascular;  Laterality: N/A;  . MELANOMA EXCISION Left 07/2013   foot  . TOTAL HIP ARTHROPLASTY  02/2010   left ; Dr Maureen Ralphs  . TOTAL HIP ARTHROPLASTY  03/18/2012   Procedure: TOTAL HIP ARTHROPLASTY;  Surgeon: Gearlean Alf, MD;  Location: [facility];  Service: Orthopedics;  Laterality: Right;  . UPPER GASTROINTESTINAL ENDOSCOPY  04/21/2009, 08/08/11   2010 gastritis ;2012 small hiatal hernia. Dr Carlean Purl    Current Medications: Current Meds  Medication Sig  . ALPRAZolam (XANAX) 1 MG tablet Take 1 tablet (1 mg total) by mouth 2 (two) times daily.  . Calcium Carbonate (CALCIUM 600 PO) Take 1 tablet by mouth daily.  . clindamycin (CLEOCIN T) 1 % lotion Apply 1  application topically 2 (two) times daily as needed (rosacea (skin irritation)).   . clobetasol (TEMOVATE) 0.05 % external solution Apply 1 application topically daily as needed (rosacea).  . fluocinonide (LIDEX) 0.05 % external solution Apply 1 application topically daily as needed (rosacea).  Marland Kitchen FLUoxetine (PROZAC) 40 MG  capsule TAKE 1 CAPSULE BY MOUTH EVERY DAY  . HYDROcodone-acetaminophen (NORCO/VICODIN) 5-325 MG tablet Take 1 tablet by mouth every 6 (six) hours as needed for moderate pain.  . methocarbamol (ROBAXIN) 500 MG tablet Take 1,000 mg by mouth 4 (four) times daily as needed for spasms.  . metroNIDAZOLE (METROCREAM) 0.75 % cream Apply 1 application topically 2 (two) times daily as needed (rosacea).   . Multiple Vitamin (MULTIVITAMIN WITH MINERALS) TABS tablet Take 1 tablet by mouth daily. Centrum Silver  . omeprazole (PRILOSEC) 40 MG capsule TAKE 1 CAPSULE BY MOUTH EVERY DAY  . potassium chloride (KLOR-CON) 10 MEQ tablet TAKE 1 TABLET BY MOUTH EVERY DAY  . [DISCONTINUED] atorvastatin (LIPITOR) 40 MG tablet TAKE 1 TABLET BY MOUTH DAILY AT 6 PM.  . [DISCONTINUED] [facility] PURELAX 17 g packet Take 17 g by mouth daily as needed for constipation.  . [DISCONTINUED] cyclobenzaprine (FLEXERIL) 10 MG tablet Take 1 tablet (10 mg total) by mouth 4 (four) times daily as needed. SPASMS  . [DISCONTINUED] diltiazem (CARDIZEM CD) 120 MG 24 hr capsule TAKE 1 CAPSULE BY MOUTH EVERY DAY  . [DISCONTINUED] ELIQUIS 5 MG TABS tablet TAKE 1 TABLET BY MOUTH TWICE A DAY  . [DISCONTINUED] furosemide (LASIX) 40 MG tablet Take 1 tablet (40 mg total) by mouth daily.  . [DISCONTINUED] metoprolol succinate (TOPROL-XL) 100 MG 24 hr tablet TAKE 1 TABLET BY MOUTH EVERY DAY  . [DISCONTINUED] olmesartan (BENICAR) 20 MG tablet Take 1 tablet (20 mg total) by mouth daily.  . [DISCONTINUED] olmesartan (BENICAR) 40 MG tablet TAKE 1 TABLET BY MOUTH EVERY DAY     Allergies:   Achromycin [tetracycline hcl], Penicillins,  Sulfamethoxazole-trimethoprim, Nickel, and Zanaflex [tizanidine hydrochloride]   Social History   Socioeconomic History  . Marital status: Married    Spouse name: Not on file  . Number of children: 2  . Years of education: Not on file  . Highest education level: Not on file  Occupational History  . 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 Smoking status: Never Smoker  . Smokeless tobacco: Never Used  Vaping Use  . Vaping Use: Never used  Substance and Sexual Activity  . Alcohol use: Yes    Comment: occasional  . Drug use: No  . Sexual activity: Not on file    Comment: Hysterectomy  Other Topics Concern  . Not on file  Social History Narrative   0 caffeine drinks    Social Determinants of Health   Financial Resource Strain: Not on file  Food Insecurity: Not on file  Transportation Needs: Not on file  Physical Activity: Not on file  Stress: Not on file  Social Connections: Not on file     Family History: The patient's family history includes Breast cancer in her paternal aunt; Breast cancer (age of onset: 13) in her mother; COPD in her maternal grandmother and mother; Diabetes in her father and paternal grandmother; Heart attack (age of onset: 34) in her brother; Heart attack (age of onset: 68) in her mother; Heart disease in her paternal grandmother; Heart failure (age of onset: 26) in her father; Hypertension in her father; Stroke in her paternal grandfather. There is no history of Colon cancer.  ROS:   Please see the history of present illness.     All other systems reviewed and are negative.  EKGs/Labs/Other Studies Reviewed:    The following studies were reviewed today:    Recent Labs: 11/27/2019: ALT 22 08/19/2020: BUN 14; Creatinine, Ser 0.88; Hemoglobin 13.6; Platelets  279; Potassium 4.0; Sodium 138  Recent Lipid Panel    Component Value Date/Time   CHOL 235 (H) 04/30/2018 1134   TRIG 169.0 (H) 04/30/2018 1134   HDL 54.70 04/30/2018 1134   CHOLHDL 4  04/30/2018 1134   VLDL 33.8 04/30/2018 1134   LDLCALC 146 (H) 04/30/2018 1134   LDLDIRECT 143.2 01/22/2013 0856    Physical Exam: Blood pressure 130/88, pulse 88, height 5\' 7"  (1.702 m), weight 282 lb (127.9 kg), SpO2 97 %.  GEN:  Middle age female,  Moderately obese.  HEENT: Normal NECK: No JVD; No carotid bruits LYMPHATICS: No lymphadenopathy CARDIAC: RRR , no murmurs, rubs, gallops RESPIRATORY:  Clear to auscultation without rales, wheezing or rhonchi  ABDOMEN: Soft, non-tender, non-distended MUSCULOSKELETAL:  No edema; No deformity  SKIN: Warm and dry NEUROLOGIC:  Alert and oriented x 3     EKG:     ASSESSMENT:    1. Essential hypertension   2. Paroxysmal atrial fibrillation (HCC)   3. Hyperlipidemia, unspecified hyperlipidemia type    PLAN:       Chronic diastolic congestive heart failure:     Seems to be stable   2.  Pulmonary hypertension:     Cont lasix,  Encouraged continued weight loss   3.  Paroxysmal atrial fibrillation:      Cont eliquis ,  She is in NSR today   4.  Coronary artery disease:   5.  Morbid obesity :    Encouraged weight loss     Medication Adjustments/Labs and Tests Ordered: Current medicines are reviewed at length with the patient today.  Concerns regarding medicines are outlined above.  No orders of the defined types were placed in this encounter.  Meds ordered this encounter  Medications  . metoprolol succinate (TOPROL-XL) 100 MG 24 hr tablet    Sig: Take 1 tablet (100 mg total) by mouth daily.    Dispense:  90 tablet    Refill:  3  . furosemide (LASIX) 40 MG tablet    Sig: Take 1 tablet (40 mg total) by mouth daily.    Dispense:  90 tablet    Refill:  3  . apixaban (ELIQUIS) 5 MG TABS tablet    Sig: Take 1 tablet (5 mg total) by mouth 2 (two) times daily.    Dispense:  180 tablet    Refill:  3  . atorvastatin (LIPITOR) 40 MG tablet    Sig: Take 1 tablet (40 mg total) by mouth daily.    Dispense:  90 tablet    Refill:   3  . diltiazem (CARDIZEM CD) 120 MG 24 hr capsule    Sig: Take 1 capsule (120 mg total) by mouth daily.    Dispense:  90 capsule    Refill:  3     Patient Instructions  Medication Instructions:  Your provider recommends that you continue on your current medications as directed. Please refer to the Current Medication list given to you today.   *If you need a refill on your cardiac medications before your next appointment, please call your pharmacy*  Follow-Up: At [facility], you and your health needs are our priority.  As part of our continuing mission to provide you with exceptional heart care, we have created designated Provider Care Teams.  These Care Teams include your primary Cardiologist (physician) and Advanced Practice Providers (APPs -  Physician Assistants and Nurse Practitioners) who all work together to provide you with the care you need, when you need it.  Your next appointment:   6 month(s) The format for your next appointment:   In Person Provider:   You may see Mertie Moores, MD or one of the following Advanced Practice Providers on your designated Care Team:    Richardson Dopp, PA-C  Robbie Lis, [place]      Signed, Mertie Moores, MD  10/12/2020 12:56 PM    [place]

## 2020-10-12 ENCOUNTER — Ambulatory Visit: Payer: Medicare Other | Admitting: Cardiovascular Disease

## 2020-10-12 ENCOUNTER — Other Ambulatory Visit: Payer: Self-pay

## 2020-10-12 ENCOUNTER — Encounter: Payer: Self-pay | Admitting: Cardiovascular Disease

## 2020-10-12 VITALS — BP 130/88 | HR 88 | Ht 67.0 in | Wt 282.0 lb

## 2020-10-12 DIAGNOSIS — I10 Essential (primary) hypertension: Secondary | ICD-10-CM

## 2020-10-12 DIAGNOSIS — E785 Hyperlipidemia, unspecified: Secondary | ICD-10-CM

## 2020-10-12 DIAGNOSIS — I5032 Chronic diastolic (congestive) heart failure: Secondary | ICD-10-CM | POA: Diagnosis not present

## 2020-10-12 DIAGNOSIS — I480 Paroxysmal atrial fibrillation: Secondary | ICD-10-CM | POA: Diagnosis not present

## 2020-10-12 MED ORDER — METOPROLOL SUCCINATE ER 100 MG PO TB24: 100.0000 mg | ORAL_TABLET | Freq: Every day | ORAL | 3 refills | Status: DC

## 2020-10-12 MED ORDER — APIXABAN 5 MG PO TABS: 5.0000 mg | ORAL_TABLET | Freq: Two times a day (BID) | ORAL | 3 refills | Status: DC

## 2020-10-12 MED ORDER — FUROSEMIDE 40 MG PO TABS: 40.0000 mg | ORAL_TABLET | Freq: Every day | ORAL | 3 refills | Status: DC

## 2020-10-12 MED ORDER — DILTIAZEM HCL ER COATED BEADS 120 MG PO CP24: 120.0000 mg | ORAL_CAPSULE | Freq: Every day | ORAL | 3 refills | Status: DC

## 2020-10-12 MED ORDER — ATORVASTATIN CALCIUM 40 MG PO TABS: 40.0000 mg | ORAL_TABLET | Freq: Every day | ORAL | 3 refills | Status: DC

## 2020-10-12 NOTE — Patient Instructions (Signed)
Medication Instructions:  Your provider recommends that you continue on your current medications as directed. Please refer to the Current Medication list given to you today.   *If you need a refill on your cardiac medications before your next appointment, please call your pharmacy*  Follow-Up: At [facility], you and your health needs are our priority.  As part of our continuing mission to provide you with exceptional heart care, we have created designated Provider Care Teams.  These Care Teams include your primary Cardiologist (physician) and Advanced Practice Providers (APPs -  Physician Assistants and Nurse Practitioners) who all work together to provide you with the care you need, when you need it. Your next appointment:   6 month(s) The format for your next appointment:   In Person Provider:   You may see Philip Nahser, MD or one of the following Advanced Practice Providers on your designated Care Team:    Scott Weaver, PA-C  Vin Bhagat, PA-C   

## 2020-10-18 ENCOUNTER — Other Ambulatory Visit: Payer: Self-pay | Admitting: Internal Medicine

## 2020-10-18 DIAGNOSIS — F419 Anxiety disorder, unspecified: Secondary | ICD-10-CM

## 2020-10-21 ENCOUNTER — Ambulatory Visit (INDEPENDENT_AMBULATORY_CARE_PROVIDER_SITE_OTHER): Payer: Medicare Other | Admitting: Internal Medicine

## 2020-10-21 ENCOUNTER — Encounter: Payer: Self-pay | Admitting: Internal Medicine

## 2020-10-21 ENCOUNTER — Other Ambulatory Visit: Payer: Self-pay

## 2020-10-21 VITALS — BP 110/80 | HR 74 | Temp 97.9°F | Wt 286.0 lb

## 2020-10-21 DIAGNOSIS — N6312 Unspecified lump in the right breast, upper inner quadrant: Secondary | ICD-10-CM

## 2020-10-21 NOTE — Progress Notes (Signed)
Established Patient Office Visit     This visit occurred during the SARS-CoV-2 public health emergency.  Safety protocols were in place, including screening questions prior to the visit, additional usage of staff PPE, and extensive cleaning of exam room while observing appropriate contact time as indicated for disinfecting solutions.    CC/Reason for Visit: Right breast lump  HPI: Kathy [name] is a 70 y.o. female who is coming in today for the above mentioned reasons.  Since I last saw her she has had some changes.  [place] the beach she [place] and had vertebral compression fractures.  She has had a kyphoplasty and is undergoing physical therapy, she is improving although still has difficulty walking and is with a cane today.  She tells me that she has noticed a lump on her right breast right above her nipple, it itches at times, no nipple discharge, no fever.  She is due for her screening mammogram.   Past Medical/Surgical History: Past Medical History:  Diagnosis Date  . Adenomatous colon polyp    2012  . Anxiety   . Anxiety and depression   . Arthritis    Osteoarthritis, Dr Maureen Ralphs  . Cancer ([place])    skin - left foot excised  . Chronic diastolic CHF (congestive heart failure) ([place])   . DDD (degenerative disc disease), cervical   . Deaf, right   . Depression   . Diverticulosis   . DVT (deep venous thrombosis) ([place])    RLE DVT 08/2004, post right knee arthroscopy  . Dyspnea    occasional- no oxygen  . Dysrhythmia    a-fib  . Fibromyalgia    Dr Tobie Lords, Rheu  . GERD (gastroesophageal reflux disease)   . H/O hiatal hernia   . Headache(784.0)    PAST HX MILD MIGRAINES - resolved per patient 08/18/20  . Heart murmur    mitral valve prolapse--no symptoms  . History of colonic polyps 08/08/2011       . History of kidney stones    passed stones  . HTN (hypertension)   . IBS (irritable bowel syndrome)   . Internal hemorrhoid   . Lumbar radiculopathy   .  Obesity   . OSA on CPAP    Moderate with AHI 20/hr with nocturnal hypoxemia on CPAP  . PAF (paroxysmal atrial fibrillation) ([place]) 06/2018  . Pulmonary hypertension ([place])   . Syncope    idiopathic, ? related to migraines or hyperventilation. Dr Leonie Man, Neurology, YEARS AGO; 02/03/20 in setting of afib with RVR    Past Surgical History:  Procedure Laterality Date  . ABDOMINAL HYSTERECTOMY     dysfunctional menses  . APPENDECTOMY    . BREAST EXCISIONAL BIOPSY Right   . CARDIAC CATHETERIZATION  06/26/2018  . CHOLECYSTECTOMY    . COLONOSCOPY  05/27/2002, 08/08/11   2003 diverticulosis, hemorrhoids 2012 same + cecal polyp  . KNEE ARTHROSCOPY  2008   right  . KYPHOPLASTY Bilateral 08/19/2020   Procedure: KYPHOPLASTY LUMBAR ONE;  Surgeon: Vallarie Mare, MD;  Location: [facility];  Service: Neurosurgery;  Laterality: Bilateral;  posterior  . LEFT HEART CATH AND CORONARY ANGIOGRAPHY N/A 06/26/2018   Procedure: LEFT HEART CATH AND CORONARY ANGIOGRAPHY;  Surgeon: Wellington Hampshire, MD;  Location: [facility];  Service: Cardiovascular;  Laterality: N/A;  . MELANOMA EXCISION Left 07/2013   foot  . TOTAL HIP ARTHROPLASTY  02/2010   left ; Dr Maureen Ralphs  . TOTAL HIP ARTHROPLASTY  03/18/2012  Procedure: TOTAL HIP ARTHROPLASTY;  Surgeon: Gearlean Alf, MD;  Location: [facility];  Service: Orthopedics;  Laterality: Right;  . UPPER GASTROINTESTINAL ENDOSCOPY  04/21/2009, 08/08/11   2010 gastritis ;2012 small hiatal hernia. Dr Carlean Purl    Social History:  reports that she has never smoked. She has never used smokeless tobacco. She reports current alcohol use. She reports that she does not use drugs.  Allergies: Allergies  Allergen Reactions  . Achromycin [Tetracycline Hcl] Shortness Of Breath    Dyspnea    . Penicillins     Facial angioedema Did it involve swelling of the face/tongue/throat, SOB, or low BP? Yes Did it involve sudden or severe rash/hives, skin peeling, or any reaction on the inside  of your mouth or nose? No Did you need to seek medical attention at a hospital or doctor's office? No When did it last happen?30+ years If all above answers are "NO", may proceed with cephalosporin use.   . Sulfamethoxazole-Trimethoprim Hives  . Nickel Itching  . Zanaflex [Tizanidine Hydrochloride]     Causes more pain     Family History:  Family History  Problem Relation Age of Onset  . Heart attack Mother 63  . COPD Mother   . Breast cancer Mother 60  . Hypertension Father   . Diabetes Father   . Heart failure Father 28  . Breast cancer Paternal Aunt        cns mets  . COPD Maternal Grandmother   . Heart disease Paternal Grandmother        died 59  . Diabetes Paternal Grandmother   . Stroke Paternal Grandfather        in late 53s  . Heart attack Brother 50  . Colon cancer Neg Hx      Current Outpatient Medications:  .  ALPRAZolam (XANAX) 1 MG tablet, TAKE 1 TABLET BY MOUTH TWICE A DAY, Disp: 60 tablet, Rfl: 1 .  apixaban (ELIQUIS) 5 MG TABS tablet, Take 1 tablet (5 mg total) by mouth 2 (two) times daily., Disp: 180 tablet, Rfl: 3 .  atorvastatin (LIPITOR) 40 MG tablet, Take 1 tablet (40 mg total) by mouth daily., Disp: 90 tablet, Rfl: 3 .  Calcium Carbonate (CALCIUM 600 PO), Take 1 tablet by mouth daily., Disp: , Rfl:  .  clindamycin (CLEOCIN T) 1 % lotion, Apply 1 application topically 2 (two) times daily as needed (rosacea (skin irritation)). , Disp: , Rfl:  .  clobetasol (TEMOVATE) 0.05 % external solution, Apply 1 application topically daily as needed (rosacea)., Disp: , Rfl:  .  diltiazem (CARDIZEM CD) 120 MG 24 hr capsule, Take 1 capsule (120 mg total) by mouth daily., Disp: 90 capsule, Rfl: 3 .  fluocinonide (LIDEX) 0.05 % external solution, Apply 1 application topically daily as needed (rosacea)., Disp: , Rfl:  .  FLUoxetine (PROZAC) 40 MG capsule, TAKE 1 CAPSULE BY MOUTH EVERY DAY, Disp: 90 capsule, Rfl: 0 .  furosemide (LASIX) 40 MG tablet, Take 1 tablet  (40 mg total) by mouth daily., Disp: 90 tablet, Rfl: 3 .  HYDROcodone-acetaminophen (NORCO/VICODIN) 5-325 MG tablet, Take 1 tablet by mouth every 6 (six) hours as needed for moderate pain., Disp: 40 tablet, Rfl: 0 .  methocarbamol (ROBAXIN) 500 MG tablet, Take 1,000 mg by mouth 4 (four) times daily as needed for spasms., Disp: , Rfl:  .  metoprolol succinate (TOPROL-XL) 100 MG 24 hr tablet, Take 1 tablet (100 mg total) by mouth daily., Disp: 90 tablet, Rfl: 3 .  metroNIDAZOLE (METROCREAM) 0.75 % cream, Apply 1 application topically 2 (two) times daily as needed (rosacea). , Disp: , Rfl:  .  Multiple Vitamin (MULTIVITAMIN WITH MINERALS) TABS tablet, Take 1 tablet by mouth daily. Centrum Silver, Disp: , Rfl:  .  omeprazole (PRILOSEC) 40 MG capsule, TAKE 1 CAPSULE BY MOUTH EVERY DAY, Disp: 90 capsule, Rfl: 1 .  potassium chloride (KLOR-CON) 10 MEQ tablet, TAKE 1 TABLET BY MOUTH EVERY DAY, Disp: 90 tablet, Rfl: 2  Review of Systems:  Constitutional: Denies fever, chills, diaphoresis, appetite change and fatigue.  HEENT: Denies photophobia, eye pain, redness, hearing loss, ear pain, congestion, sore throat, rhinorrhea, sneezing, mouth sores, trouble swallowing, neck pain, neck stiffness and tinnitus.   Respiratory: Denies SOB, DOE, cough, chest tightness,  and wheezing.   Cardiovascular: Denies chest pain, palpitations and leg swelling.  Gastrointestinal: Denies nausea, vomiting, abdominal pain, diarrhea, constipation, blood in stool and abdominal distention.  Genitourinary: Denies dysuria, urgency, frequency, hematuria, flank pain and difficulty urinating.  Endocrine: Denies: hot or cold intolerance, sweats, changes in hair or nails, polyuria, polydipsia. Musculoskeletal: Denies myalgias, back pain, joint swelling, arthralgias and gait problem.  Skin: Denies pallor, rash and wound.  Neurological: Denies dizziness, seizures, syncope, weakness, light-headedness, numbness and headaches.  Hematological:  Denies adenopathy. Easy bruising, personal or family bleeding history  Psychiatric/Behavioral: Denies suicidal ideation, mood changes, confusion, nervousness, sleep disturbance and agitation    Physical Exam: Vitals:   10/21/20 1310  BP: 110/80  Pulse: 74  Temp: 97.9 F (36.6 C)  TempSrc: Oral  SpO2: 97%  Weight: 286 lb (129.7 kg)    Body mass index is 44.79 kg/m.  Constitutional: NAD, calm, comfortable, ambulating with a cane Eyes: PERRL, lids and conjunctivae normal, wears corrective lenses ENMT: Mucous membranes are moist.  Respiratory: clear to auscultation bilaterally, no wheezing, no crackles. Normal respiratory effort. No accessory muscle use.  Cardiovascular: Regular rate and rhythm, no murmurs / rubs / gallops. No extremity edema.  Breast: She has a very small palpable lump at around the 1 o'clock position on her right breast, it is painful to touch. Skin: no rashes, lesions, ulcers. No induration Psychiatric: Normal judgment and insight. Alert and oriented x 3. Normal mood.    Impression and Plan:  Breast lump on right side at 1 o'clock position  - Plan: MM Digital Screening, MM DIAG BREAST TOMO BILATERAL, MM Digital Screening Unilat R -When she suffered her fall 3 months ago that culminated in her compression fractures, she did fall on the right side of her body.  The fact that this mass is painful makes me wonder if it could be bruising or hematoma from it.  Nonetheless I believe it is appropriate for a diagnostic imaging of that breast, especially given breast cancer in her mother.     Lelon Frohlich, MD [facility] at [facility]

## 2020-10-26 ENCOUNTER — Other Ambulatory Visit: Payer: Self-pay | Admitting: Internal Medicine

## 2020-10-26 DIAGNOSIS — F419 Anxiety disorder, unspecified: Secondary | ICD-10-CM

## 2020-10-26 DIAGNOSIS — N6312 Unspecified lump in the right breast, upper inner quadrant: Secondary | ICD-10-CM

## 2020-10-27 ENCOUNTER — Other Ambulatory Visit: Payer: Self-pay | Admitting: Internal Medicine

## 2020-10-27 DIAGNOSIS — K219 Gastro-esophageal reflux disease without esophagitis: Secondary | ICD-10-CM

## 2020-10-29 ENCOUNTER — Ambulatory Visit
Admission: RE | Admit: 2020-10-29 | Discharge: 2020-10-29 | Disposition: A | Discharge status: Home / Self Care | Payer: Medicare Other | Source: Ambulatory Visit | Attending: Internal Medicine | Admitting: Internal Medicine

## 2020-10-29 ENCOUNTER — Other Ambulatory Visit: Payer: Self-pay

## 2020-10-29 ENCOUNTER — Other Ambulatory Visit: Payer: Self-pay | Admitting: Internal Medicine

## 2020-10-29 DIAGNOSIS — N6312 Unspecified lump in the right breast, upper inner quadrant: Secondary | ICD-10-CM

## 2020-11-25 ENCOUNTER — Ambulatory Visit (INDEPENDENT_AMBULATORY_CARE_PROVIDER_SITE_OTHER): Payer: Medicare Other | Admitting: Otolaryngology

## 2020-11-25 ENCOUNTER — Telehealth: Payer: Self-pay | Admitting: Internal Medicine

## 2020-11-25 ENCOUNTER — Other Ambulatory Visit: Payer: Self-pay

## 2020-11-25 DIAGNOSIS — H9041 Sensorineural hearing loss, unilateral, right ear, with unrestricted hearing on the contralateral side: Secondary | ICD-10-CM | POA: Diagnosis not present

## 2020-11-25 NOTE — Telephone Encounter (Signed)
Spoke with the pt and informed her of the message below.  Appt scheduled for 3/11 at 4pm.

## 2020-11-25 NOTE — Telephone Encounter (Signed)
She will need an OV to discuss. Could be virtual.

## 2020-11-25 NOTE — Progress Notes (Signed)
HPI: Kathy [name] is a 70 y.o. female who returns today for evaluation of sensation of decreased hearing in her left ear which is her only good hearing ear.  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  Since that time she has been dependent on hearing in the left ear only.  She feels like she might have lost some  hearing in the left ear..  Past Medical History:  Diagnosis Date  . Adenomatous colon polyp    2012  . Anxiety   . Anxiety and depression   . Arthritis    Osteoarthritis, Dr Maureen Ralphs  . Cancer ([place])    skin - left foot excised  . Chronic diastolic CHF (congestive heart failure) ([place])   . DDD (degenerative disc disease), cervical   . Deaf, right   . Depression   . Diverticulosis   . DVT (deep venous thrombosis) ([place])    RLE DVT 08/2004, post right knee arthroscopy  . Dyspnea    occasional- no oxygen  . Dysrhythmia    a-fib  . Fibromyalgia    Dr Tobie Lords, Rheu  . GERD (gastroesophageal reflux disease)   . H/O hiatal hernia   . Headache(784.0)    PAST HX MILD MIGRAINES - resolved per patient 08/18/20  . Heart murmur    mitral valve prolapse--no symptoms  . History of colonic polyps 08/08/2011       . History of kidney stones    passed stones  . HTN (hypertension)   . IBS (irritable bowel syndrome)   . Internal hemorrhoid   . Lumbar radiculopathy   . Obesity   . OSA on CPAP    Moderate with AHI 20/hr with nocturnal hypoxemia on CPAP  . PAF (paroxysmal atrial fibrillation) ([place]) 06/2018  . Pulmonary hypertension ([place])   . Syncope    idiopathic, ? related to migraines or hyperventilation. Dr Leonie Man, Neurology, YEARS AGO; 02/03/20 in setting of afib with RVR   Past Surgical History:  Procedure Laterality Date  . ABDOMINAL HYSTERECTOMY     dysfunctional menses  . APPENDECTOMY    . BREAST EXCISIONAL BIOPSY Right   . CARDIAC CATHETERIZATION  06/26/2018  . CHOLECYSTECTOMY    . COLONOSCOPY  05/27/2002, 08/08/11   2003  diverticulosis, hemorrhoids 2012 same + cecal polyp  . KNEE ARTHROSCOPY  2008   right  . KYPHOPLASTY Bilateral 08/19/2020   Procedure: KYPHOPLASTY LUMBAR ONE;  Surgeon: Vallarie Mare, MD;  Location: [facility];  Service: Neurosurgery;  Laterality: Bilateral;  posterior  . LEFT HEART CATH AND CORONARY ANGIOGRAPHY N/A 06/26/2018   Procedure: LEFT HEART CATH AND CORONARY ANGIOGRAPHY;  Surgeon: Wellington Hampshire, MD;  Location: [facility];  Service: Cardiovascular;  Laterality: N/A;  . MELANOMA EXCISION Left 07/2013   foot  . TOTAL HIP ARTHROPLASTY  02/2010   left ; Dr Maureen Ralphs  . TOTAL HIP ARTHROPLASTY  03/18/2012   Procedure: TOTAL HIP ARTHROPLASTY;  Surgeon: Gearlean Alf, MD;  [facility];  Service: Orthopedics;  Laterality: Right;  . UPPER GASTROINTESTINAL ENDOSCOPY  04/21/2009, 08/08/11   2010 gastritis ;2012 small hiatal hernia. Dr Carlean Purl   Social History   Socioeconomic History  . Marital status: Married    Spouse name: Not on file  . Number of children: 2  . Years of education: Not on file  . Highest education level: Not on file  Occupational History  . 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 Smoking status: Never  Smoker  . Smokeless tobacco: Never Used  Vaping Use  . Vaping Use: Never used  Substance and Sexual Activity  . Alcohol use: Yes    Comment: occasional  . Drug use: No  . Sexual activity: Not on file    Comment: Hysterectomy  Other Topics Concern  . Not on file  Social History Narrative   0 caffeine drinks    Social Determinants of Health   Financial Resource Strain: Not on file  Food Insecurity: Not on file  Transportation Needs: Not on file  Physical Activity: Not on file  Stress: Not on file  Social Connections: Not on file   Family History  Problem Relation Age of Onset  . Heart attack Mother 72  . COPD Mother   . Breast cancer Mother 61  . Hypertension Father   . Diabetes Father   . Heart failure Father 39  . Breast cancer  Paternal Aunt        cns mets  . COPD Maternal Grandmother   . Heart disease Paternal Grandmother        died 47  . Diabetes Paternal Grandmother   . Stroke Paternal Grandfather        in late 75s  . Heart attack Brother 2  . Colon cancer Neg Hx    Allergies  Allergen Reactions  . Achromycin [Tetracycline Hcl] Shortness Of Breath    Dyspnea    . Penicillins     Facial angioedema Did it involve swelling of the face/tongue/throat, SOB, or low BP? Yes Did it involve sudden or severe rash/hives, skin peeling, or any reaction on the inside of your mouth or nose? No Did you need to seek medical attention at a hospital or doctor's office? No When did it last happen?30+ years If all above answers are "NO", may proceed with cephalosporin use.   . Sulfamethoxazole-Trimethoprim Hives  . Nickel Itching  . Zanaflex [Tizanidine Hydrochloride]     Causes more pain    Prior to Admission medications   Medication Sig Start Date End Date Taking? Authorizing Provider  ALPRAZolam [name]) 1 MG tablet TAKE 1 TABLET BY MOUTH TWICE A DAY 10/19/20   Isaac Bliss, Rayford Halsted, MD  apixaban (ELIQUIS) 5 MG TABS tablet Take 1 tablet (5 mg total) by mouth 2 (two) times daily. 10/12/20 10/07/21  Nahser, Wonda Cheng, MD  atorvastatin (LIPITOR) 40 MG tablet Take 1 tablet (40 mg total) by mouth daily. 10/12/20 10/07/21  Nahser, Wonda Cheng, MD  Calcium Carbonate (CALCIUM 600 PO) Take 1 tablet by mouth daily.    [provider]  clindamycin (CLEOCIN T) 1 % lotion Apply 1 application topically 2 (two) times daily as needed (rosacea (skin irritation)).     [provider]  clobetasol (TEMOVATE) 0.05 % external solution Apply 1 application topically daily as needed (rosacea).    [provider]  diltiazem (CARDIZEM CD) 120 MG 24 hr capsule Take 1 capsule (120 mg total) by mouth daily. 10/12/20 10/07/21  Nahser, Wonda Cheng, MD  fluocinonide (LIDEX) 0.05 % external solution Apply 1 application  topically daily as needed (rosacea).    [provider]  FLUoxetine (PROZAC) 40 MG capsule TAKE 1 CAPSULE BY MOUTH EVERY DAY 10/26/20   Isaac Bliss, Rayford Halsted, MD  furosemide (LASIX) 40 MG tablet Take 1 tablet (40 mg total) by mouth daily. 10/12/20 10/07/21  Nahser, Wonda Cheng, MD  HYDROcodone-acetaminophen (NORCO/VICODIN) 5-325 MG tablet Take 1 tablet by mouth every 6 (six) hours as needed for  moderate pain. 08/19/20   Vallarie Mare, MD  methocarbamol (ROBAXIN) 500 MG tablet Take 1,000 mg by mouth 4 (four) times daily as needed for spasms. 08/06/20   [provider]  metoprolol succinate (TOPROL-XL) 100 MG 24 hr tablet Take 1 tablet (100 mg total) by mouth daily. 10/12/20 10/07/21  Nahser, Wonda Cheng, MD  metroNIDAZOLE (METROCREAM) 0.75 % cream Apply 1 application topically 2 (two) times daily as needed (rosacea).     [provider]  Multiple Vitamin (MULTIVITAMIN WITH MINERALS) TABS tablet Take 1 tablet by mouth daily. Centrum Silver    [provider]  omeprazole (PRILOSEC) 40 MG capsule TAKE 1 CAPSULE BY MOUTH EVERY DAY 10/28/20   Isaac Bliss, Rayford Halsted, MD  potassium chloride (KLOR-CON) 10 MEQ tablet TAKE 1 TABLET BY MOUTH EVERY DAY 07/06/20   Nahser, Wonda Cheng, MD     Positive ROS: Otherwise negative  All other systems have been reviewed and were otherwise negative with the exception of those mentioned in the HPI and as above.  Physical Exam: Constitutional: Alert, well-appearing, no acute distress Ears: External ears without lesions or tenderness. Ear canals are clear bilaterally with intact, clear TMs.  Nasal: External nose without lesions.. Clear nasal passages Oral: Lips and gums without lesions. Tongue and palate mucosa without lesions. Posterior oropharynx clear. Neck: No palpable adenopathy or masses Respiratory: Breathing comfortably  Skin: No facial/neck lesions or rash noted.  Audiogram today in the office demonstrated essentially normal  hearing in the left ear with SRT of 10 dB and type A tympanogram.  Right ear had severe sensorineural hearing loss with SRT of 100.  Procedures  Assessment: Stable hearing in the left ear with no evidence of hearing loss compared to previous audiogram in 2016.  Plan: Reassured patient of normal hearing in the left ear with no evidence of hearing loss.   Radene Journey, MD

## 2020-11-25 NOTE — Telephone Encounter (Signed)
Pt call and stated she want to know if dr. Jerilee Hoh would increase her Xanax from 2 a day to more than that . She stated she would like 3 to 4 a day.

## 2020-11-26 ENCOUNTER — Other Ambulatory Visit: Payer: Self-pay

## 2020-11-26 ENCOUNTER — Encounter (INDEPENDENT_AMBULATORY_CARE_PROVIDER_SITE_OTHER): Payer: Self-pay

## 2020-11-26 ENCOUNTER — Telehealth (INDEPENDENT_AMBULATORY_CARE_PROVIDER_SITE_OTHER): Payer: Medicare Other | Admitting: Internal Medicine

## 2020-11-26 DIAGNOSIS — F419 Anxiety disorder, unspecified: Secondary | ICD-10-CM

## 2020-11-26 MED ORDER — ALPRAZOLAM 1 MG PO TABS: 1.0000 mg | ORAL_TABLET | Freq: Three times a day (TID) | ORAL | 2 refills | Status: DC | PRN

## 2020-11-26 NOTE — Progress Notes (Signed)
Virtual Visit via Telephone Note  I connected with Kathy [name] on 11/26/20 at  4:00 PM EST by telephone and verified that I am speaking with the correct person using two identifiers.   I discussed the limitations, risks, security and privacy concerns of performing an evaluation and management service by telephone and the availability of in person appointments. I also discussed with the patient that there may be a patient responsible charge related to this service. The patient expressed understanding and agreed to proceed.  Location patient: home Location provider: work office Participants present for the call: patient, provider Patient did not have a visit in the prior 7 days to address this/these issue(s).   History of Present Illness:  She has been having significant worsening of her anxiety.  She is on fluoxetine 40 mg daily and Xanax 1 mg twice daily as needed.  She recently suffered a fall with a back injury requiring surgery, also her husband was recently diagnosed with a very aggressive throat cancer it sounds like.  She had been receiving counseling for years but stopped 3 years ago.  She is thinking about resuming.  She is wondering if we could temporarily increase her Xanax dosing.  She is very tearful.  She has no suicidal ideation or intent.   Observations/Objective: Patient sounds tearful and sad I do not appreciate any increased work of breathing. Speech and thought processing are grossly intact. Patient reported vitals: None reported   Current Outpatient Medications:  .  apixaban (ELIQUIS) 5 MG TABS tablet, Take 1 tablet (5 mg total) by mouth 2 (two) times daily., Disp: 180 tablet, Rfl: 3 .  atorvastatin (LIPITOR) 40 MG tablet, Take 1 tablet (40 mg total) by mouth daily., Disp: 90 tablet, Rfl: 3 .  Calcium Carbonate (CALCIUM 600 PO), Take 1 tablet by mouth daily., Disp: , Rfl:  .  clindamycin (CLEOCIN T) 1 % lotion, Apply 1  application topically 2 (two) times daily as needed (rosacea (skin irritation)). , Disp: , Rfl:  .  clobetasol (TEMOVATE) 0.05 % external solution, Apply 1 application topically daily as needed (rosacea)., Disp: , Rfl:  .  diltiazem (CARDIZEM CD) 120 MG 24 hr capsule, Take 1 capsule (120 mg total) by mouth daily., Disp: 90 capsule, Rfl: 3 .  fluocinonide (LIDEX) 0.05 % external solution, Apply 1 application topically daily as needed (rosacea)., Disp: , Rfl:  .  FLUoxetine (PROZAC) 40 MG capsule, TAKE 1 CAPSULE BY MOUTH EVERY DAY, Disp: 90 capsule, Rfl: 1 .  furosemide (LASIX) 40 MG tablet, Take 1 tablet (40 mg total) by mouth daily., Disp: 90 tablet, Rfl: 3 .  HYDROcodone-acetaminophen (NORCO/VICODIN) 5-325 MG tablet, Take 1 tablet by mouth every 6 (six) hours as needed for moderate pain., Disp: 40 tablet, Rfl: 0 .  methocarbamol (ROBAXIN) 500 MG tablet, Take 1,000 mg by mouth 4 (four) times daily as needed for spasms., Disp: , Rfl:  .  metoprolol succinate (TOPROL-XL) 100 MG 24 hr tablet, Take 1 tablet (100 mg total) by mouth daily., Disp: 90 tablet, Rfl: 3 .  metroNIDAZOLE (METROCREAM) 0.75 % cream, Apply 1 application topically 2 (two) times daily as needed (rosacea). , Disp: , Rfl:  .  Multiple Vitamin (MULTIVITAMIN WITH MINERALS) TABS tablet, Take 1 tablet by mouth daily. Centrum Silver, Disp: , Rfl:  .  omeprazole (PRILOSEC) 40 MG capsule, TAKE 1 CAPSULE BY MOUTH EVERY DAY, Disp: 90 capsule, Rfl: 1 .  potassium chloride (KLOR-CON) 10 MEQ tablet, TAKE 1 TABLET BY  MOUTH EVERY DAY, Disp: 90 tablet, Rfl: 2 .  ALPRAZolam (XANAX) 1 MG tablet, Take 1 tablet (1 mg total) by mouth 3 (three) times daily as needed for anxiety., Disp: 90 tablet, Rfl: 2  Review of Systems:  Constitutional: Denies fever, chills, diaphoresis, appetite change and fatigue.  HEENT: Denies photophobia, eye pain, redness, hearing loss, ear pain, congestion, sore throat, rhinorrhea, sneezing, mouth sores, trouble swallowing, neck  pain, neck stiffness and tinnitus.   Respiratory: Denies SOB, DOE, cough, chest tightness,  and wheezing.   Cardiovascular: Denies chest pain, palpitations and leg swelling.  Gastrointestinal: Denies nausea, vomiting, abdominal pain, diarrhea, constipation, blood in stool and abdominal distention.  Genitourinary: Denies dysuria, urgency, frequency, hematuria, flank pain and difficulty urinating.  Endocrine: Denies: hot or cold intolerance, sweats, changes in hair or nails, polyuria, polydipsia. Musculoskeletal: Denies myalgias, back pain, joint swelling, arthralgias and gait problem.  Skin: Denies pallor, rash and wound.  Neurological: Denies dizziness, seizures, syncope, weakness, light-headedness, numbness and headaches.  Hematological: Denies adenopathy. Easy bruising, personal or family bleeding history  Psychiatric/Behavioral: Denies suicidal ideation,  confusion, nervousness, sleep disturbance and agitation   Assessment and Plan:  Anxiety  -She will resume her CBT sessions. -Continue Prozac, I will temporarily increase her Xanax dosing to 3 times a day as opposed to 2 times a day. -She will follow-up with me in 3 months.  She may benefit from starting more long-term anxiety medications.    I discussed the assessment and treatment plan with the patient. The patient was provided an opportunity to ask questions and all were answered. The patient agreed with the plan and demonstrated an understanding of the instructions.   The patient was advised to call back or seek an in-person evaluation if the symptoms worsen or if the condition fails to improve as anticipated.  I provided 23 minutes of non-face-to-face time during this encounter.   Lelon Frohlich, MD [facility] at [facility]

## 2020-11-30 LAB — HM DEXA SCAN

## 2020-12-01 ENCOUNTER — Other Ambulatory Visit: Payer: Self-pay

## 2020-12-01 ENCOUNTER — Emergency Department (HOSPITAL_COMMUNITY): Payer: Medicare Other

## 2020-12-01 ENCOUNTER — Emergency Department (HOSPITAL_COMMUNITY)
Admission: EM | Admit: 2020-12-01 | Discharge: 2020-12-02 | Disposition: A | Discharge status: Home / Self Care | Payer: Medicare Other | Attending: Emergency Medicine | Admitting: Emergency Medicine

## 2020-12-01 DIAGNOSIS — Z7901 Long term (current) use of anticoagulants: Secondary | ICD-10-CM | POA: Insufficient documentation

## 2020-12-01 DIAGNOSIS — Z96643 Presence of artificial hip joint, bilateral: Secondary | ICD-10-CM | POA: Diagnosis not present

## 2020-12-01 DIAGNOSIS — Z86718 Personal history of other venous thrombosis and embolism: Secondary | ICD-10-CM | POA: Diagnosis not present

## 2020-12-01 DIAGNOSIS — Z85828 Personal history of other malignant neoplasm of skin: Secondary | ICD-10-CM | POA: Diagnosis not present

## 2020-12-01 DIAGNOSIS — Z79899 Other long term (current) drug therapy: Secondary | ICD-10-CM | POA: Insufficient documentation

## 2020-12-01 DIAGNOSIS — I110 Hypertensive heart disease with heart failure: Secondary | ICD-10-CM | POA: Insufficient documentation

## 2020-12-01 DIAGNOSIS — I5032 Chronic diastolic (congestive) heart failure: Secondary | ICD-10-CM | POA: Diagnosis not present

## 2020-12-01 DIAGNOSIS — I480 Paroxysmal atrial fibrillation: Secondary | ICD-10-CM | POA: Insufficient documentation

## 2020-12-01 DIAGNOSIS — R079 Chest pain, unspecified: Secondary | ICD-10-CM | POA: Diagnosis present

## 2020-12-01 DIAGNOSIS — R0602 Shortness of breath: Secondary | ICD-10-CM

## 2020-12-01 LAB — CBC WITH DIFFERENTIAL/PLATELET
Abs Immature Granulocytes: 0.01 10*3/uL (ref 0.00–0.07)
Basophils Absolute: 0 10*3/uL (ref 0.0–0.1)
Basophils Relative: 0 %
Eosinophils Absolute: 0.2 10*3/uL (ref 0.0–0.5)
Eosinophils Relative: 3 %
HCT: 41.1 % (ref 36.0–46.0)
Hemoglobin: 12.7 g/dL (ref 12.0–15.0)
Immature Granulocytes: 0 %
Lymphocytes Relative: 21 %
Lymphs Abs: 1.3 10*3/uL (ref 0.7–4.0)
MCH: 28.2 pg (ref 26.0–34.0)
MCHC: 30.9 g/dL (ref 30.0–36.0)
MCV: 91.3 fL (ref 80.0–100.0)
Monocytes Absolute: 0.4 10*3/uL (ref 0.1–1.0)
Monocytes Relative: 7 %
Neutro Abs: 4.3 10*3/uL (ref 1.7–7.7)
Neutrophils Relative %: 69 %
Platelets: 191 10*3/uL (ref 150–400)
RBC: 4.5 MIL/uL (ref 3.87–5.11)
RDW: 16 % — ABNORMAL HIGH (ref 11.5–15.5)
WBC: 6.2 10*3/uL (ref 4.0–10.5)
nRBC: 0 % (ref 0.0–0.2)

## 2020-12-01 NOTE — ED Provider Notes (Signed)
[facility] EMERGENCY DEPARTMENT Provider Note   CSN: [id number] Arrival date & time: 12/01/20  2143     History Chief Complaint  Patient presents with  . Chest Pain  . Shortness of Breath    Kathy [name] is a 70 y.o. female.  Patient to ED with left sided chest pain that radiates to left scapula and left arm, described as intermittent, without modifying factors, and is associated with SOB. She noticed the shortness of breath over the last day or so. She reports having a mild cough she attributed to seasonal allergy symptoms. No fever, congestion. No definite pleuritic pain. She is having mild nausea, no vomiting. She is COVID vaccinated. History of dCHF, pulmonary HTN, PAF on Eliquis, remote history of DVT, recent lumbar kyphoplasty, IBS, OSA, morbid obesity, anxiety. She had a cardiac cath in 2019 showing "mild disease", did not require stenting.   The history is provided by the patient. No language interpreter was used.       Past Medical History:  Diagnosis Date  . Adenomatous colon polyp    2012  . Anxiety   . Anxiety and depression   . Arthritis    Osteoarthritis, Dr Maureen Ralphs  . Cancer ([place])    skin - left foot excised  . Chronic diastolic CHF (congestive heart failure) ([place])   . DDD (degenerative disc disease), cervical   . Deaf, right   . Depression   . Diverticulosis   . DVT (deep venous thrombosis) ([place])    RLE DVT 08/2004, post right knee arthroscopy  . Dyspnea    occasional- no oxygen  . Dysrhythmia    a-fib  . Fibromyalgia    Dr Tobie Lords, Rheu  . GERD (gastroesophageal reflux disease)   . H/O hiatal hernia   . Headache(784.0)    PAST HX MILD MIGRAINES - resolved per patient 08/18/20  . Heart murmur    mitral valve prolapse--no symptoms  . History of colonic polyps 08/08/2011       . History of kidney stones    passed stones  . HTN (hypertension)   . IBS (irritable bowel syndrome)   . Internal hemorrhoid   . Lumbar  radiculopathy   . Obesity   . OSA on CPAP    Moderate with AHI 20/hr with nocturnal hypoxemia on CPAP  . PAF (paroxysmal atrial fibrillation) ([place]) 06/2018  . Pulmonary hypertension ([place])   . Syncope    idiopathic, ? related to migraines or hyperventilation. Dr Leonie Man, Neurology, YEARS AGO; 02/03/20 in setting of afib with RVR    Patient Active Problem List   Diagnosis Date Noted  . OSA on CPAP   . Depression, recurrent ([place]) 05/06/2019  . Persistent atrial fibrillation ([place]) 12/31/2018  . Chronic diastolic CHF (congestive heart failure) ([place]) 12/31/2018  . Bilateral leg edema 07/29/2018  . Paroxysmal atrial fibrillation ([place]) 06/25/2018  . Chest pain 06/25/2018  . Anxiety 06/25/2018  . Difficulty walking 02/13/2017  . Low back pain 02/13/2017  . Allergic rhinitis 07/31/2016  . Malignant melanoma ([place]) 08/31/2013  . Other abnormal glucose 01/22/2013  . Anemia, unspecified 01/22/2013  . OA (osteoarthritis) of hip 03/18/2012  . Personal history of DVT (deep vein thrombosis) 01/05/2012  . History of colonic polyps 08/08/2011  . ARTHRITIS, HIP 01/24/2010  . PERSONAL HISTORY OF FAILED MODERATE SEDATION 07/21/2009  . Morbid obesity ([place]) 04/05/2009  . Esophageal reflux 02/25/2009  . HEART VALVE DISEASE 12/28/2008  . HEADACHE 04/21/2008  . Essential hypertension 10/15/2007  .  LUMBAR RADICULOPATHY, RIGHT 10/15/2007  . FATIGUE 10/15/2007  . IRRITABLE BOWEL SYNDROME 07/23/2007  . Fibromyalgia 07/23/2007    Past Surgical History:  Procedure Laterality Date  . ABDOMINAL HYSTERECTOMY     dysfunctional menses  . APPENDECTOMY    . BREAST EXCISIONAL BIOPSY Right   . CARDIAC CATHETERIZATION  06/26/2018  . CHOLECYSTECTOMY    . COLONOSCOPY  05/27/2002, 08/08/11   2003 diverticulosis, hemorrhoids 2012 same + cecal polyp  . KNEE ARTHROSCOPY  2008   right  . KYPHOPLASTY Bilateral 08/19/2020   Procedure: KYPHOPLASTY LUMBAR ONE;  Surgeon: Vallarie Mare, MD;  Location: [place];  Service:  Neurosurgery;  Laterality: Bilateral;  posterior  . LEFT HEART CATH AND CORONARY ANGIOGRAPHY N/A 06/26/2018   Procedure: LEFT HEART CATH AND CORONARY ANGIOGRAPHY;  Surgeon: Wellington Hampshire, MD;  Location: [place] [facility];  Service: Cardiovascular;  Laterality: N/A;  . MELANOMA EXCISION Left 07/2013   foot  . TOTAL HIP ARTHROPLASTY  02/2010   left ; Dr Maureen Ralphs  . TOTAL HIP ARTHROPLASTY  03/18/2012   Procedure: TOTAL HIP ARTHROPLASTY;  Surgeon: Gearlean Alf, MD;  Location: [facility];  Service: Orthopedics;  Laterality: Right;  . UPPER GASTROINTESTINAL ENDOSCOPY  04/21/2009, 08/08/11   2010 gastritis ;2012 small hiatal hernia. Dr Carlean Purl     OB History   No obstetric history on file.     Family History  Problem Relation Age of Onset  . Heart attack Mother 21  . COPD Mother   . Breast cancer Mother 49  . Hypertension Father   . Diabetes Father   . Heart failure Father 66  . Breast cancer Paternal Aunt        cns mets  . COPD Maternal Grandmother   . Heart disease Paternal Grandmother        died 55  . Diabetes Paternal Grandmother   . Stroke Paternal Grandfather        in late 27s  . Heart attack Brother 36  . Colon cancer Neg Hx     Social History   Tobacco Use  . Smoking status: Never Smoker  . Smokeless tobacco: Never Used  Vaping Use  . Vaping Use: Never used  Substance Use Topics  . Alcohol use: Yes    Comment: occasional  . Drug use: No    Home Medications Prior to Admission medications   Medication Sig Start Date End Date Taking? Authorizing Provider  ALPRAZolam [name]) 1 MG tablet Take 1 tablet (1 mg total) by mouth 3 (three) times daily as needed for anxiety. 11/26/20 02/24/21  Isaac Bliss, Rayford Halsted, MD  apixaban (ELIQUIS) 5 MG TABS tablet Take 1 tablet (5 mg total) by mouth 2 (two) times daily. 10/12/20 10/07/21  Nahser, Wonda Cheng, MD  atorvastatin (LIPITOR) 40 MG tablet Take 1 tablet (40 mg total) by mouth daily. 10/12/20 10/07/21  Nahser, Wonda Cheng, MD   Calcium Carbonate (CALCIUM 600 PO) Take 1 tablet by mouth daily.    [provider]  clindamycin (CLEOCIN T) 1 % lotion Apply 1 application topically 2 (two) times daily as needed (rosacea (skin irritation)).     [provider]  clobetasol (TEMOVATE) 0.05 % external solution Apply 1 application topically daily as needed (rosacea).    [provider]  diltiazem (CARDIZEM CD) 120 MG 24 hr capsule Take 1 capsule (120 mg total) by mouth daily. 10/12/20 10/07/21  Nahser, Wonda Cheng, MD  fluocinonide (LIDEX) 0.05 % external solution Apply 1 application topically  daily as needed (rosacea).    [provider]  FLUoxetine (PROZAC) 40 MG capsule TAKE 1 CAPSULE BY MOUTH EVERY DAY 10/26/20   Isaac Bliss, Rayford Halsted, MD  furosemide (LASIX) 40 MG tablet Take 1 tablet (40 mg total) by mouth daily. 10/12/20 10/07/21  Nahser, Wonda Cheng, MD  HYDROcodone-acetaminophen (NORCO/VICODIN) 5-325 MG tablet Take 1 tablet by mouth every 6 (six) hours as needed for moderate pain. 08/19/20   Vallarie Mare, MD  methocarbamol (ROBAXIN) 500 MG tablet Take 1,000 mg by mouth 4 (four) times daily as needed for spasms. 08/06/20   [provider]  metoprolol succinate (TOPROL-XL) 100 MG 24 hr tablet Take 1 tablet (100 mg total) by mouth daily. 10/12/20 10/07/21  Nahser, Wonda Cheng, MD  metroNIDAZOLE (METROCREAM) 0.75 % cream Apply 1 application topically 2 (two) times daily as needed (rosacea).     [provider]  Multiple Vitamin (MULTIVITAMIN WITH MINERALS) TABS tablet Take 1 tablet by mouth daily. Centrum Silver    [provider]  omeprazole (PRILOSEC) 40 MG capsule TAKE 1 CAPSULE BY MOUTH EVERY DAY 10/28/20   Isaac Bliss, Rayford Halsted, MD  potassium chloride (KLOR-CON) 10 MEQ tablet TAKE 1 TABLET BY MOUTH EVERY DAY 07/06/20   Nahser, Wonda Cheng, MD    Allergies    Achromycin [tetracycline hcl], Penicillins, Sulfamethoxazole-trimethoprim, Nickel, and Zanaflex [tizanidine  hydrochloride]  Review of Systems   Review of Systems  Constitutional: Negative for chills, diaphoresis and fever.  HENT: Negative.  Negative for congestion and sneezing.   Respiratory: Positive for cough and shortness of breath.   Cardiovascular: Positive for chest pain.  Gastrointestinal: Positive for nausea. Negative for abdominal pain and vomiting.  Musculoskeletal: Negative.   Skin: Negative.   Neurological: Negative.     Physical Exam Updated Vital Signs BP (!) 148/101   Pulse 85   Temp 97.8 F (36.6 C) (Oral)   Resp 18   Ht 5\' 6"  (1.676 m)   Wt 132.5 kg   SpO2 98%   BMI 47.13 kg/m   Physical Exam Vitals and nursing note reviewed.  Constitutional:      General: She is not in acute distress.    Appearance: She is well-developed. She is obese. She is not diaphoretic.  HENT:     Head: Normocephalic.  Eyes:     Conjunctiva/sclera: Conjunctivae normal.  Cardiovascular:     Rate and Rhythm: Normal rate. Rhythm irregular.     Heart sounds: No murmur heard.   Pulmonary:     Effort: Pulmonary effort is normal.     Breath sounds: No wheezing, rhonchi or rales.     Comments: Mild dyspnea but able to speak full sentences without pausing Chest:     Chest wall: No tenderness.  Abdominal:     Palpations: Abdomen is soft.     Tenderness: There is no abdominal tenderness.  Musculoskeletal:        General: Normal range of motion.     Cervical back: Normal range of motion and neck supple.     Comments: No pitting edema  Skin:    General: Skin is warm and dry.  Neurological:     General: No focal deficit present.     Mental Status: She is alert.     ED Results / Procedures / Treatments   Labs (all labs ordered are listed, but only abnormal results are displayed) Labs Reviewed  CBC WITH DIFFERENTIAL/PLATELET  COMPREHENSIVE METABOLIC PANEL  BRAIN NATRIURETIC PEPTIDE  TROPONIN I (  HIGH SENSITIVITY)    EKG None  Radiology No results  found.  Procedures Procedures   Medications Ordered in ED Medications - No data to display  ED Course  I have reviewed the triage vital signs and the nursing notes.  Pertinent labs & imaging results that were available during my care of the patient were reviewed by me and considered in my medical decision making (see chart for details).    MDM Rules/Calculators/A&P                          Patient to ED with left, radiating, intermittent chest pain since this afternoon associated with nausea and SOB.   She is in atrial fibrillation, rate in the 70's. No pain on arrival but she continues to be short of breath/dyspneic. CXR without edema or infection. No hypoxia.   DDx: ACS vs PE vs symptomatic a-fib  Serial troponins pending. Plan for CTA chest given significant risk factors for PE despite Eliquis use.   Serial troponins negative.   On multiple rechecks, the patient has had no recurrent chest pain. She is uncomfortable from the standpoint of recent back injury and is overdue for her regular dose of Norco. Medication ordered. VSS. Waiting for CTA.   CTA is negative for PE. She has remained asymptomatic. She is ambulated and maintains 98% on room air.   She is felt appropriate for discharge home. All questions answered.    Final Clinical Impression(s) / ED Diagnoses Final diagnoses:  None   1. Dyspnea 2. Nonspecific chest pain 3. PAF  Rx / DC Orders ED Discharge Orders    None       Charlann Lange, PA-C 12/02/20 [id number]    Margette Fast, MD 12/02/20 [phone]

## 2020-12-01 NOTE — ED Triage Notes (Signed)
BIB [facility] from home. C/o cheat pain and SOB since 3pm. Chest pain radiates to left arm. Hx: afib. 325 Aspirin and one nitro. No improvement. Pt was placed on 2L [place] for comfort and pt states that helped a little.   121/84 70-90 heart rate afib 96%

## 2020-12-02 ENCOUNTER — Emergency Department (HOSPITAL_COMMUNITY): Payer: Medicare Other

## 2020-12-02 LAB — COMPREHENSIVE METABOLIC PANEL
ALT: 17 U/L (ref 0–44)
AST: 22 U/L (ref 15–41)
Albumin: 3.8 g/dL (ref 3.5–5.0)
Alkaline Phosphatase: 83 U/L (ref 38–126)
Anion gap: 10 (ref 5–15)
BUN: 22 mg/dL (ref 8–23)
CO2: 28 mmol/L (ref 22–32)
Calcium: 9.1 mg/dL (ref 8.9–10.3)
Chloride: 100 mmol/L (ref 98–111)
Creatinine, Ser: 0.96 mg/dL (ref 0.44–1.00)
GFR, Estimated: >60 mL/min (ref >60)
Glucose, Bld: 108 mg/dL — ABNORMAL HIGH (ref 70–99)
Potassium: 4.5 mmol/L (ref 3.5–5.1)
Sodium: 138 mmol/L (ref 135–145)
Total Bilirubin: 1 mg/dL (ref 0.3–1.2)
Total Protein: 6.5 g/dL (ref 6.5–8.1)

## 2020-12-02 LAB — TROPONIN I (HIGH SENSITIVITY)
Troponin I (High Sensitivity): 4 ng/L (ref <18)
Troponin I (High Sensitivity): 3 ng/L (ref <18)

## 2020-12-02 LAB — BRAIN NATRIURETIC PEPTIDE: B Natriuretic Peptide: 271.7 pg/mL — ABNORMAL HIGH (ref 0.0–100.0)

## 2020-12-02 MED ORDER — IOHEXOL 350 MG/ML SOLN
80.0000 mL | Freq: Once | INTRAVENOUS | Status: AC | PRN
  Administered 2020-12-02: 80 mL via INTRAVENOUS

## 2020-12-02 MED ORDER — HYDROCODONE-ACETAMINOPHEN 5-325 MG PO TABS
1.0000 | ORAL_TABLET | Freq: Once | ORAL | Status: AC
  Administered 2020-12-02: 1 via ORAL
  Filled 2020-12-02: qty 1

## 2020-12-02 NOTE — ED Notes (Signed)
Pt ambulated to and from restroom with walker. No complaints of SOB when ambulating.

## 2020-12-02 NOTE — ED Notes (Signed)
Patient verbalizes understanding of discharge instructions. Opportunity for questioning and answers were provided. Armband removed by staff, pt discharged from ED via wheelchair.  

## 2020-12-02 NOTE — ED Notes (Signed)
O2 maintained 98% while ambulating. No c/o.

## 2020-12-02 NOTE — Discharge Instructions (Signed)
Continue your regular medications. Follow up with primary care or with Dr. Acie Fredrickson for further outpatient evaluation of chest pain and shortness of breath.

## 2020-12-06 ENCOUNTER — Telehealth: Payer: Self-pay | Admitting: Cardiovascular Disease

## 2020-12-06 NOTE — Telephone Encounter (Signed)
RN returned call to patient regarding medication questions. RN reviewed patients current medications she is taking and the indications. Patient states she thought Dr. Acie Fredrickson had told her she was on medication for Afib but she wanted to make sure. RN explained that NTG is not needed or Afib. Patient verbalized understanding. Rn encouraged patient to contact the office with any questions or concerns. Patient thanked Therapist, sports for calling.

## 2020-12-06 NOTE — Telephone Encounter (Signed)
Patient went to the ER with some Afib last week. The ER Doctor wanted to know if the patient was on any medication to control her Afib.   The ER Doctor also wanted to know if the patient should be carrying Nitro in case of emergency.  Please advise

## 2020-12-06 NOTE — Telephone Encounter (Signed)
She is on Eliquis and diltiazem.   She does not need NTG for atrial fib. We can discuss more at her next office visit

## 2020-12-07 ENCOUNTER — Encounter: Payer: Self-pay | Admitting: Internal Medicine

## 2020-12-24 ENCOUNTER — Ambulatory Visit: Payer: Medicare Other | Admitting: Internal Medicine

## 2020-12-28 ENCOUNTER — Ambulatory Visit: Payer: Medicare Other | Admitting: Internal Medicine

## 2020-12-28 ENCOUNTER — Encounter: Payer: Self-pay | Admitting: Internal Medicine

## 2020-12-28 ENCOUNTER — Other Ambulatory Visit: Payer: Self-pay

## 2020-12-28 VITALS — BP 130/90 | HR 82 | Temp 98.4°F | Wt 293.4 lb

## 2020-12-28 DIAGNOSIS — F339 Major depressive disorder, recurrent, unspecified: Secondary | ICD-10-CM | POA: Diagnosis not present

## 2020-12-28 DIAGNOSIS — I10 Essential (primary) hypertension: Secondary | ICD-10-CM

## 2020-12-28 DIAGNOSIS — Z09 Encounter for follow-up examination after completed treatment for conditions other than malignant neoplasm: Secondary | ICD-10-CM | POA: Diagnosis not present

## 2020-12-28 DIAGNOSIS — I480 Paroxysmal atrial fibrillation: Secondary | ICD-10-CM | POA: Diagnosis not present

## 2020-12-28 MED ORDER — BUPROPION HCL ER (XL) 150 MG PO TB24: 150.0000 mg | ORAL_TABLET | Freq: Every day | ORAL | 1 refills | Status: DC

## 2020-12-28 NOTE — Patient Instructions (Signed)
-  Nice seeing you today!!  -Start Wellbutrin 150 mg daily.  -Make sure you schedule therapy sessions with the contact information I have provided you.  -Schedule follow up in 12 weeks.

## 2020-12-28 NOTE — Progress Notes (Signed)
Established Patient Office Visit     This visit occurred during the SARS-CoV-2 public health emergency.  Safety protocols were in place, including screening questions prior to the visit, additional usage of staff PPE, and extensive cleaning of exam room while observing appropriate contact time as indicated for disinfecting solutions.    CC/Reason for Visit: ED follow-up, discuss A. fib, depression  HPI: Kathy [name] is a 70 y.o. female who is coming in today for the above mentioned reasons.  She was seen in the emergency department for chest pain and palpitations on March 16.  She was in A. fib but rate controlled.  She had labs, EKGs and CT angio of the chest.  All work-up was normal.  She has since followed up with her cardiologist who has resumed her diltiazem.  She is anticoagulated on Eliquis.  She continues to have significant depressive issues.  She is on Prozac and alprazolam.  She requests information on CBT sessions.  Past Medical/Surgical History: Past Medical History:  Diagnosis Date  . Adenomatous colon polyp    2012  . Anxiety   . Anxiety and depression   . Arthritis    Osteoarthritis, Dr Maureen Ralphs  . Cancer ([place])    skin - left foot excised  . Chronic diastolic CHF (congestive heart failure) ([place])   . DDD (degenerative disc disease), cervical   . Deaf, right   . Depression   . Diverticulosis   . DVT (deep venous thrombosis) ([place])    RLE DVT 08/2004, post right knee arthroscopy  . Dyspnea    occasional- no oxygen  . Dysrhythmia    a-fib  . Fibromyalgia    Dr Tobie Lords, Rheu  . GERD (gastroesophageal reflux disease)   . H/O hiatal hernia   . Headache(784.0)    PAST HX MILD MIGRAINES - resolved per patient 08/18/20  . Heart murmur    mitral valve prolapse--no symptoms  . History of colonic polyps 08/08/2011       . History of kidney stones    passed stones  . HTN (hypertension)   . IBS (irritable bowel syndrome)   . Internal hemorrhoid   .  Lumbar radiculopathy   . Obesity   . OSA on CPAP    Moderate with AHI 20/hr with nocturnal hypoxemia on CPAP  . PAF (paroxysmal atrial fibrillation) ([place]) 06/2018  . Pulmonary hypertension ([place])   . Syncope    idiopathic, ? related to migraines or hyperventilation. Dr Leonie Man, Neurology, YEARS AGO; 02/03/20 in setting of afib with RVR    Past Surgical History:  Procedure Laterality Date  . ABDOMINAL HYSTERECTOMY     dysfunctional menses  . APPENDECTOMY    . BREAST EXCISIONAL BIOPSY Right   . CARDIAC CATHETERIZATION  06/26/2018  . CHOLECYSTECTOMY    . COLONOSCOPY  05/27/2002, 08/08/11   2003 diverticulosis, hemorrhoids 2012 same + cecal polyp  . KNEE ARTHROSCOPY  2008   right  . KYPHOPLASTY Bilateral 08/19/2020   Procedure: KYPHOPLASTY LUMBAR ONE;  Surgeon: Vallarie Mare, MD;  Location: [place];  Service: Neurosurgery;  Laterality: Bilateral;  posterior  . LEFT HEART CATH AND CORONARY ANGIOGRAPHY N/A 06/26/2018   Procedure: LEFT HEART CATH AND CORONARY ANGIOGRAPHY;  Surgeon: Wellington Hampshire, MD;  Location: [place] [facility];  Service: Cardiovascular;  Laterality: N/A;  . MELANOMA EXCISION Left 07/2013   foot  . TOTAL HIP ARTHROPLASTY  02/2010   left ; Dr Maureen Ralphs  . TOTAL HIP ARTHROPLASTY  03/18/2012   Procedure: TOTAL HIP ARTHROPLASTY;  Surgeon: Gearlean Alf, MD;  Location[facility];  Service: Orthopedics;  Laterality: Right;  . UPPER GASTROINTESTINAL ENDOSCOPY  04/21/2009, 08/08/11   2010 gastritis ;2012 small hiatal hernia. Dr Carlean Purl    Social History:  reports that she has never smoked. She has never used smokeless tobacco. She reports current alcohol use. She reports that she does not use drugs.  Allergies: Allergies  Allergen Reactions  . Achromycin [Tetracycline Hcl] Shortness Of Breath    Dyspnea    . Penicillins     Facial angioedema Did it involve swelling of the face/tongue/throat, SOB, or low BP? Yes Did it involve sudden or severe rash/hives, skin peeling, or  any reaction on the inside of your mouth or nose? No Did you need to seek medical attention at a hospital or doctor's office? No When did it last happen?30+ years If all above answers are "NO", may proceed with cephalosporin use.   . Sulfamethoxazole-Trimethoprim Hives  . Nickel Itching  . Zanaflex [Tizanidine Hydrochloride]     Causes more pain     Family History:  Family History  Problem Relation Age of Onset  . Heart attack Mother 15  . COPD Mother   . Breast cancer Mother 32  . Hypertension Father   . Diabetes Father   . Heart failure Father 50  . Breast cancer Paternal Aunt        cns mets  . COPD Maternal Grandmother   . Heart disease Paternal Grandmother        died 33  . Diabetes Paternal Grandmother   . Stroke Paternal Grandfather        in late 32s  . Heart attack Brother 71  . Colon cancer Neg Hx      Current Outpatient Medications:  .  ALPRAZolam (XANAX) 1 MG tablet, Take 1 tablet (1 mg total) by mouth 3 (three) times daily as needed for anxiety., Disp: 90 tablet, Rfl: 2 .  apixaban (ELIQUIS) 5 MG TABS tablet, Take 1 tablet (5 mg total) by mouth 2 (two) times daily., Disp: 180 tablet, Rfl: 3 .  atorvastatin (LIPITOR) 40 MG tablet, Take 1 tablet (40 mg total) by mouth daily. (Patient taking differently: Take 40 mg by mouth at bedtime.), Disp: 90 tablet, Rfl: 3 .  buPROPion (WELLBUTRIN XL) 150 MG 24 hr tablet, Take 1 tablet (150 mg total) by mouth daily., Disp: 90 tablet, Rfl: 1 .  Calcium Carbonate (CALCIUM 600 PO), Take 1 tablet by mouth daily., Disp: , Rfl:  .  clindamycin (CLEOCIN T) 1 % lotion, Apply 1 application topically 2 (two) times daily as needed (rosacea (skin irritation)). , Disp: , Rfl:  .  clobetasol (TEMOVATE) 0.05 % external solution, Apply 1 application topically daily as needed (rosacea)., Disp: , Rfl:  .  diltiazem (CARDIZEM CD) 120 MG 24 hr capsule, Take 1 capsule (120 mg total) by mouth daily. (Patient taking differently: Take 120 mg by  mouth at bedtime.), Disp: 90 capsule, Rfl: 3 .  fluocinonide (LIDEX) 0.05 % external solution, Apply 1 application topically daily as needed (rosacea)., Disp: , Rfl:  .  FLUoxetine (PROZAC) 40 MG capsule, TAKE 1 CAPSULE BY MOUTH EVERY DAY (Patient taking differently: Take 40 mg by mouth daily.), Disp: 90 capsule, Rfl: 1 .  furosemide (LASIX) 40 MG tablet, Take 1 tablet (40 mg total) by mouth daily., Disp: 90 tablet, Rfl: 3 .  HYDROcodone-acetaminophen (NORCO) 7.5-325 MG tablet, Take 1 tablet by mouth  every 6 (six) hours as needed for moderate pain., Disp: , Rfl:  .  methocarbamol (ROBAXIN) 500 MG tablet, Take 500 mg by mouth 4 (four) times daily as needed for spasms., Disp: , Rfl:  .  metoprolol succinate (TOPROL-XL) 100 MG 24 hr tablet, Take 1 tablet (100 mg total) by mouth daily., Disp: 90 tablet, Rfl: 3 .  metroNIDAZOLE (METROCREAM) 0.75 % cream, Apply 1 application topically 2 (two) times daily as needed (rosacea). , Disp: , Rfl:  .  Multiple Vitamin (MULTIVITAMIN WITH MINERALS) TABS tablet, Take 1 tablet by mouth daily. Centrum Silver, Disp: , Rfl:  .  omeprazole (PRILOSEC) 40 MG capsule, TAKE 1 CAPSULE BY MOUTH EVERY DAY (Patient taking differently: Take 40 mg by mouth daily.), Disp: 90 capsule, Rfl: 1 .  potassium chloride (KLOR-CON) 10 MEQ tablet, TAKE 1 TABLET BY MOUTH EVERY DAY (Patient taking differently: Take 10 mEq by mouth daily.), Disp: 90 tablet, Rfl: 2  Review of Systems:  Constitutional: Denies fever, chills, diaphoresis, appetite change and fatigue.  HEENT: Denies photophobia, eye pain, redness, hearing loss, ear pain, congestion, sore throat, rhinorrhea, sneezing, mouth sores, trouble swallowing, neck pain, neck stiffness and tinnitus.   Respiratory: Denies SOB, DOE, cough, chest tightness,  and wheezing.   Cardiovascular: Denies chest pain, palpitations and leg swelling.  Gastrointestinal: Denies nausea, vomiting, abdominal pain, diarrhea, constipation, blood in stool and  abdominal distention.  Genitourinary: Denies dysuria, urgency, frequency, hematuria, flank pain and difficulty urinating.  Endocrine: Denies: hot or cold intolerance, sweats, changes in hair or nails, polyuria, polydipsia. Musculoskeletal: Denies myalgias, back pain, joint swelling, arthralgias and gait problem.  Skin: Denies pallor, rash and wound.  Neurological: Denies dizziness, seizures, syncope, weakness, light-headedness, numbness and headaches.  Hematological: Denies adenopathy. Easy bruising, personal or family bleeding history  Psychiatric/Behavioral: Denies suicidal ideation,  confusion, nervousness, sleep disturbance and agitation    Physical Exam: Vitals:   12/28/20 1327  BP: 130/90  Pulse: 82  Temp: 98.4 F (36.9 C)  TempSrc: Oral  SpO2: 97%  Weight: 293 lb 6.4 oz (133.1 kg)    Body mass index is 47.36 kg/m.   Constitutional: NAD, calm, comfortable, obese Eyes: PERRL, lids and conjunctivae normal, wears corrective lenses ENMT: Mucous membranes are moist.  Respiratory: clear to auscultation bilaterally, no wheezing, no crackles. Normal respiratory effort. No accessory muscle use.  Cardiovascular: Regular rate and rhythm, no murmurs / rubs / gallops. No extremity edema. Psychiatric: Normal judgment and insight. Alert and oriented x 3.  Tearful at times.   Impression and Plan:  Hospital discharge follow-up  Depression, recurrent ([place]) - Plan: buPROPion (WELLBUTRIN XL) 150 MG 24 hr tablet  Paroxysmal atrial fibrillation [facility])  Essential hypertension  -Hospital charts have been reviewed, she has been restarted on diltiazem, she continues on her Eliquis. -In regards to her depression, this has worsened.  Her husband has been diagnosed with recurrent, aggressive throat cancer.  In addition she recently suffered from a broken back that causes a lot of pain issues.  Continue Prozac and alprazolam as she is, add Wellbutrin 150 mg daily.  She has been provided with  contact information for CBT sessions and has been urged to schedule these as soon as possible.  She will follow-up with me in 12 weeks.    Patient Instructions  -Nice seeing you today!!  -Start Wellbutrin 150 mg daily.  -Make sure you schedule therapy sessions with the contact information I have provided you.  -Schedule follow up in 12 weeks.  Anija Brickner Hernandez Acosta, MD [facility]   

## 2021-01-07 ENCOUNTER — Ambulatory Visit: Payer: Medicare Other

## 2021-02-25 ENCOUNTER — Ambulatory Visit: Payer: Medicare Other

## 2021-02-28 ENCOUNTER — Other Ambulatory Visit: Payer: Self-pay | Admitting: Internal Medicine

## 2021-02-28 DIAGNOSIS — Z1231 Encounter for screening mammogram for malignant neoplasm of breast: Secondary | ICD-10-CM

## 2021-03-03 ENCOUNTER — Ambulatory Visit
Admission: RE | Admit: 2021-03-03 | Discharge: 2021-03-03 | Disposition: A | Discharge status: Home / Self Care | Payer: Medicare Other | Source: Ambulatory Visit | Attending: Internal Medicine | Admitting: Internal Medicine

## 2021-03-03 ENCOUNTER — Other Ambulatory Visit: Payer: Self-pay

## 2021-03-03 DIAGNOSIS — Z1231 Encounter for screening mammogram for malignant neoplasm of breast: Secondary | ICD-10-CM

## 2021-03-14 ENCOUNTER — Other Ambulatory Visit: Payer: Self-pay | Admitting: Internal Medicine

## 2021-03-14 DIAGNOSIS — F419 Anxiety disorder, unspecified: Secondary | ICD-10-CM

## 2021-03-31 ENCOUNTER — Other Ambulatory Visit: Payer: Self-pay | Admitting: Cardiovascular Disease

## 2021-04-22 ENCOUNTER — Other Ambulatory Visit: Payer: Self-pay | Admitting: Internal Medicine

## 2021-04-22 DIAGNOSIS — F419 Anxiety disorder, unspecified: Secondary | ICD-10-CM

## 2021-04-22 DIAGNOSIS — K219 Gastro-esophageal reflux disease without esophagitis: Secondary | ICD-10-CM

## 2021-04-26 ENCOUNTER — Emergency Department (HOSPITAL_COMMUNITY): Payer: Medicare Other

## 2021-04-26 ENCOUNTER — Emergency Department (HOSPITAL_COMMUNITY)
Admission: EM | Admit: 2021-04-26 | Discharge: 2021-04-26 | Disposition: A | Discharge status: Home / Self Care | Payer: Medicare Other | Attending: Emergency Medicine | Admitting: Emergency Medicine

## 2021-04-26 ENCOUNTER — Other Ambulatory Visit: Payer: Self-pay

## 2021-04-26 DIAGNOSIS — Z20822 Contact with and (suspected) exposure to covid-19: Secondary | ICD-10-CM | POA: Diagnosis not present

## 2021-04-26 DIAGNOSIS — T1490XA Injury, unspecified, initial encounter: Secondary | ICD-10-CM

## 2021-04-26 DIAGNOSIS — W01198A Fall on same level from slipping, tripping and stumbling with subsequent striking against other object, initial encounter: Secondary | ICD-10-CM | POA: Diagnosis not present

## 2021-04-26 DIAGNOSIS — Z7901 Long term (current) use of anticoagulants: Secondary | ICD-10-CM | POA: Diagnosis not present

## 2021-04-26 DIAGNOSIS — S0990XA Unspecified injury of head, initial encounter: Secondary | ICD-10-CM | POA: Insufficient documentation

## 2021-04-26 DIAGNOSIS — M542 Cervicalgia: Secondary | ICD-10-CM | POA: Insufficient documentation

## 2021-04-26 DIAGNOSIS — Y92000 Kitchen of unspecified non-institutional (private) residence as  the place of occurrence of the external cause: Secondary | ICD-10-CM | POA: Diagnosis not present

## 2021-04-26 DIAGNOSIS — Y9301 Activity, walking, marching and hiking: Secondary | ICD-10-CM | POA: Insufficient documentation

## 2021-04-26 DIAGNOSIS — S62015A Nondisplaced fracture of distal pole of navicular [scaphoid] bone of left wrist, initial encounter for closed fracture: Secondary | ICD-10-CM | POA: Insufficient documentation

## 2021-04-26 DIAGNOSIS — S52502A Unspecified fracture of the lower end of left radius, initial encounter for closed fracture: Secondary | ICD-10-CM | POA: Insufficient documentation

## 2021-04-26 DIAGNOSIS — S62102A Fracture of unspecified carpal bone, left wrist, initial encounter for closed fracture: Secondary | ICD-10-CM

## 2021-04-26 DIAGNOSIS — S6992XA Unspecified injury of left wrist, hand and finger(s), initial encounter: Secondary | ICD-10-CM | POA: Diagnosis present

## 2021-04-26 LAB — RESP PANEL BY RT-PCR (FLU A&B, COVID) ARPGX2
Influenza A by PCR: NEGATIVE
Influenza B by PCR: NEGATIVE
SARS Coronavirus 2 by RT PCR: NEGATIVE

## 2021-04-26 MED ORDER — OXYCODONE-ACETAMINOPHEN 5-325 MG PO TABS: 1.0000 | ORAL_TABLET | Freq: Four times a day (QID) | ORAL | 0 refills | Status: DC | PRN

## 2021-04-26 MED ORDER — HYDROMORPHONE HCL 1 MG/ML IJ SOLN
0.5000 mg | Freq: Once | INTRAMUSCULAR | Status: AC
  Administered 2021-04-26: 0.5 mg via INTRAVENOUS
  Filled 2021-04-26: qty 1

## 2021-04-26 MED ORDER — HYDROMORPHONE HCL 1 MG/ML IJ SOLN: 1.0000 mg | Freq: Once | INTRAMUSCULAR | Status: DC

## 2021-04-26 MED ORDER — HYDROMORPHONE HCL 1 MG/ML IJ SOLN
0.5000 mg | Freq: Once | INTRAMUSCULAR | Status: AC
Start: 2021-04-26 — End: 2021-04-26
  Administered 2021-04-26: 0.5 mg via INTRAVENOUS
  Filled 2021-04-26: qty 1

## 2021-04-26 NOTE — Discharge Instructions (Addendum)
Orthopedic specialist to make an appointment to be seen in a week.  Return to the ER if you have fevers vomiting cough new pains or any additional concerns.

## 2021-04-26 NOTE — ED Notes (Signed)
Per [facility] pt had mechanical fall in kitchen- fell backwards onto ground. No LOC, c/o headache and nausea. Usually ambulates with cane and lost track of cane. Unable to place c-collar so placed towel. Left wrist deformity and splinted by EMS. Patient alert and orientated x 4 on arrival. Given 157mg fentanyl and 4 mg zofran.

## 2021-04-26 NOTE — Progress Notes (Signed)
Orthopedic Tech Progress Note Patient Details:  [name] 1951/06/14 [id number]  Level 2 trauma. Not needed at this moment   Patient ID: Kathy [name], female   DOB: 06-18-1951, 70 y.o.   MRN: [id number]  Janit Pagan 04/26/2021, 10:09 AM

## 2021-04-26 NOTE — ED Notes (Signed)
Patient transported to CT 

## 2021-04-26 NOTE — Progress Notes (Signed)
Orthopedic Tech Progress Note Patient Details:  Kathy [name] Dec 28, 1950 [id number]  Ortho Devices Type of Ortho Device: Thumb spica splint Splint Material: Fiberglass Ortho Device/Splint Location: LUE Ortho Device/Splint Interventions: Ordered, Application, Adjustment   Post Interventions Patient Tolerated: Well Instructions Provided: [facility] [place] 04/26/2021, 11:47 AM

## 2021-04-26 NOTE — Consult Note (Signed)
Reason for Consult:Left wrist fx Referring Physician: Thamas Jaegers Time called: [id number] Time at bedside: [name] is an 70 y.o. female.  HPI: Kathy [name] was in her kitchen getting ready to go to the dentist when she lost her balance and fell backwards. She thinks she must have hit her left wrist on the kitchen chair of something because it hurt and looked deformed. She was brought to the ED as a level 2 trauma activation 2/2 fall on blood thinners. X-rays showed a left wrist fx and hand surgery was consulted. She is RHD.  No past medical history on file.   No family history on file.  Social History:  has no history on file for tobacco use, alcohol use, and drug use.  Allergies:  Allergies  Allergen Reactions   Penicillins    Zanaflex [Tizanidine]     Medications: I have reviewed the patient's current medications.  No results found for this or any previous visit (from the past 48 hour(s)).  DG Wrist Complete Left  Result Date: 04/26/2021 CLINICAL DATA:  Wrist deformity post fall EXAM: LEFT WRIST - COMPLETE 3+ VIEW COMPARISON:  None FINDINGS: Osseous demineralization. Transverse metaphyseal fracture distal LEFT radius with dorsal tilt of distal radial articular surface and suspect extension into radiocarpal joint. Additionally, cortical offset is identified at the waist of the scaphoid consistent with a subtle nondisplaced transverse mid scaphoid fracture. Ulnar styloid intact. No additional fracture, dislocation, or bone destruction. IMPRESSION: Comminuted distal LEFT radial metaphyseal fracture with dorsal tilt of distal radial articular surface and intra-articular extension at radiocarpal joint. Nondisplaced scaphoid waist fracture. Electronically Signed   By: Lavonia Dana M.D.   On: 04/26/2021 10:29   DG Pelvis Portable  Result Date: 04/26/2021 CLINICAL DATA:  Trauma.  Fall. EXAM: PORTABLE PELVIS 1-2 VIEWS COMPARISON:  02/12/2017 FINDINGS: Bilateral hip arthroplasties are  again noted with incomplete imaging of the femoral components on this single radiograph of the pelvis. No acute pelvic fracture or diastasis is identified. IMPRESSION: No acute osseous abnormality identified. Electronically Signed   By: Logan Bores M.D.   On: 04/26/2021 10:26   DG Chest Portable 1 View  Result Date: 04/26/2021 CLINICAL DATA:  Trauma, fall, deformity wrist EXAM: PORTABLE CHEST 1 VIEW COMPARISON:  12/01/2020 FINDINGS: Enlargement of cardiac silhouette. Mediastinal contours and pulmonary vascularity normal. Atherosclerotic calcification aorta. Lungs clear. No pulmonary infiltrate, pleural effusion, or pneumothorax. Endplate spur formation thoracic spine. IMPRESSION: Enlargement of cardiac silhouette. No acute abnormalities. Aortic Atherosclerosis (ICD10-I70.0). Electronically Signed   By: Lavonia Dana M.D.   On: 04/26/2021 10:27    Review of Systems  HENT:  Negative for ear discharge, ear pain, hearing loss and tinnitus.   Eyes:  Negative for photophobia and pain.  Respiratory:  Negative for cough and shortness of breath.   Cardiovascular:  Negative for chest pain.  Gastrointestinal:  Negative for abdominal pain, nausea and vomiting.  Genitourinary:  Negative for dysuria, flank pain, frequency and urgency.  Musculoskeletal:  Positive for arthralgias (Left wrist) and back pain. Negative for myalgias and neck pain.  Neurological:  Negative for dizziness and headaches.  Hematological:  Does not bruise/bleed easily.  Psychiatric/Behavioral:  The patient is not nervous/anxious.   Blood pressure 131/85, pulse 79, temperature 98.6 F (37 C), temperature source Oral, resp. rate 13, height '5\' 7"'$  (1.702 m), weight 127 kg, SpO2 95 %. Physical Exam Constitutional:      General: She is not in acute distress.    Appearance: She is  well-developed. She is not diaphoretic.  HENT:     Head: Normocephalic and atraumatic.  Eyes:     General: No scleral icterus.       Right eye: No discharge.         Left eye: No discharge.     Conjunctiva/sclera: Conjunctivae normal.  Neck:     Comments: C-collar Cardiovascular:     Rate and Rhythm: Normal rate and regular rhythm.  Pulmonary:     Effort: Pulmonary effort is normal. No respiratory distress.  Musculoskeletal:     Comments: Left shoulder, elbow, wrist, digits- no skin wounds, mod TTP wrist, no instability, no blocks to motion  Sens  Ax/R/M/U intact  Mot   Ax/ R/ PIN/ M/ AIN/ U intact  Rad 2+  Skin:    General: Skin is warm and dry.  Neurological:     Mental Status: She is alert.  Psychiatric:        Mood and Affect: Mood normal.        Behavior: Behavior normal.    Assessment/Plan: Left wrist fx -- Plan splint, NWB, and f/u with Dr. Mardelle Matte in 2 weeks.    Lisette Abu, PA-C Orthopedic Surgery [phone] 04/26/2021, 10:49 AM

## 2021-04-26 NOTE — Progress Notes (Signed)
Responded to ED page to support patient that had a fall in kitchen experiencing several injuries. Patient alert and speaking with staff .  Chaplain provided prayer and ministry of presence. Will follow as needed.  Jaclynn Major, [place], [facility], Pager [phone]

## 2021-04-26 NOTE — ED Provider Notes (Signed)
[facility] EMERGENCY DEPARTMENT Provider Note   CSN: [id number] Arrival date & time: 04/26/21  0945     History Chief Complaint  Patient presents with   Fall   Wrist Injury    Kathy [name] is a 70 y.o. female.  Patient presented as a level 2 trauma activation.  She states that she was walking at home in the kitchen when she lost her balance and fell backwards and hit her head.  She struck that her left arm and felt a popping pain in her left wrist as well.  Denies loss of consciousness.  Denies chest pain or abdominal pain or other extremity pain other than her left wrist.  She states that she has a series of back problems typically walks with a cane.      No past medical history on file.  There are no problems to display for this patient.   OB History   No obstetric history on file.     No family history on file.     Home Medications Prior to Admission medications   Not on File    Allergies    Penicillins and Zanaflex [tizanidine]  Review of Systems   Review of Systems  Constitutional:  Negative for fever.  HENT:  Negative for ear pain.   Eyes:  Negative for pain.  Respiratory:  Negative for cough.   Cardiovascular:  Negative for chest pain.  Gastrointestinal:  Negative for abdominal pain.  Genitourinary:  Negative for flank pain.  Musculoskeletal:  Positive for back pain.  Skin:  Negative for rash.  Neurological:  Positive for headaches.   Physical Exam Updated Vital Signs BP 115/80   Pulse 77   Temp 98.6 F (37 C) (Oral)   Resp 20   Ht '5\' 7"'$  (1.702 m)   Wt 127 kg   SpO2 96%   BMI 43.85 kg/m   Physical Exam Constitutional:      General: She is not in acute distress.    Appearance: Normal appearance.  HENT:     Head: Normocephalic.     Nose: Nose normal.  Eyes:     Extraocular Movements: Extraocular movements intact.  Cardiovascular:     Rate and Rhythm: Normal rate.  Pulmonary:     Effort: Pulmonary effort is  normal.  Musculoskeletal:     Cervical back: Normal range of motion.     Comments: No obvious deformity seen at the left wrist.  However tender to palpation the left wrist and tenderness to the snuffbox.  Neurovascularly intact otherwise.  Diffuse mild to moderate CT and L-spine midline tenderness noted.    Neurological:     General: No focal deficit present.     Mental Status: She is alert and oriented to person, place, and time. Mental status is at baseline.     Cranial Nerves: No cranial nerve deficit.     Motor: No weakness.    ED Results / Procedures / Treatments   Labs (all labs ordered are listed, but only abnormal results are displayed) Labs Reviewed  RESP PANEL BY RT-PCR (FLU A&B, COVID) [id number]    EKG None  Radiology DG Wrist Complete Left  Result Date: 04/26/2021 CLINICAL DATA:  Wrist deformity post fall EXAM: LEFT WRIST - COMPLETE 3+ VIEW COMPARISON:  None FINDINGS: Osseous demineralization. Transverse metaphyseal fracture distal LEFT radius with dorsal tilt of distal radial articular surface and suspect extension into radiocarpal joint. Additionally, cortical offset is identified at the waist of the  scaphoid consistent with a subtle nondisplaced transverse mid scaphoid fracture. Ulnar styloid intact. No additional fracture, dislocation, or bone destruction. IMPRESSION: Comminuted distal LEFT radial metaphyseal fracture with dorsal tilt of distal radial articular surface and intra-articular extension at radiocarpal joint. Nondisplaced scaphoid waist fracture. Electronically Signed   By: Lavonia Dana M.D.   On: 04/26/2021 10:29   CT HEAD WO CONTRAST  Result Date: 04/26/2021 CLINICAL DATA:  Fall EXAM: CT HEAD WITHOUT CONTRAST TECHNIQUE: Contiguous axial images were obtained from the base of the skull through the vertex without intravenous contrast. COMPARISON:  Brain MRI 12/07/2014, CT head 01/05/2018 FINDINGS: Brain: There is no evidence of acute intracranial hemorrhage,  extra-axial fluid collection, or infarct. No mass lesion is seen. The ventricles are not enlarged. There is no midline shift. Vascular: There is calcification of the bilateral cavernous ICAs and vertebral arteries. Skull: Normal. Negative for fracture or focal lesion. Sinuses/Orbits: The paranasal sinuses are clear. The globes and orbits are unremarkable. Other: The mastoid air cells are clear. IMPRESSION: No acute intracranial pathology. Electronically Signed   By: Valetta Mole MD   On: 04/26/2021 10:52   CT CERVICAL SPINE WO CONTRAST  Result Date: 04/26/2021 CLINICAL DATA:  Neck trauma EXAM: CT CERVICAL SPINE WITHOUT CONTRAST TECHNIQUE: Multidetector CT imaging of the cervical spine was performed without intravenous contrast. Multiplanar CT image reconstructions were also generated. COMPARISON:  CT cervical spine 02/12/2017 FINDINGS: Alignment: Normal. Skull base and vertebrae: Vertebral body heights are preserved. There is no evidence of acute fracture. There is no suspicious osseous lesion. Soft tissues and spinal canal: No prevertebral fluid or swelling. No visible canal hematoma. Disc levels: There is mild multilevel uncovertebral and facet arthropathy throughout the cervical spine, most advanced at C5-C6. The osseous spinal canal is patent. There is mild to moderate neural foraminal stenosis at C5-C6. Upper chest: The lung apices are clear. Other: The soft tissues are unremarkable. IMPRESSION: No acute fracture or traumatic malalignment of the cervical spine. Mild degenerative changes as above. Electronically Signed   By: Valetta Mole MD   On: 04/26/2021 10:56   CT Thoracic Spine Wo Contrast  Result Date: 04/26/2021 CLINICAL DATA:  Back trauma, no prior imaging; post fall EXAM: CT THORACIC AND LUMBAR SPINE WITHOUT CONTRAST TECHNIQUE: Multidetector CT imaging of the thoracic and lumbar spine was performed without contrast. Multiplanar CT image reconstructions were also generated. COMPARISON:  None.  FINDINGS: CT THORACIC SPINE FINDINGS Alignment: Anteroposterior alignment is maintained. Vertebrae: Vertebral body heights are preserved.  No acute fracture. Paraspinal and other soft tissues: No acute abnormality. Disc levels: Minor degenerative changes. No high-grade canal stenosis. CT LUMBAR SPINE FINDINGS Segmentation: 5 lumbar type vertebrae. Alignment: Trace anterolisthesis at L4-L5. Superior endplate retropulsion at L1. Vertebrae: Chronic L1 compression fracture with cement augmentation. Vertebral body heights are otherwise maintained. No acute fracture. Paraspinal and other soft tissues: No acute abnormality. Disc levels: L1 superior endplate retropulsion there is also mild to moderate canal stenosis. Degenerative changes are present without high-grade osseous encroachment on the spinal canal. IMPRESSION: No acute fracture of the thoracolumbar spine. Electronically Signed   By: Macy Mis M.D.   On: 04/26/2021 10:48   CT Lumbar Spine Wo Contrast  Result Date: 04/26/2021 CLINICAL DATA:  Back trauma, no prior imaging; post fall EXAM: CT THORACIC AND LUMBAR SPINE WITHOUT CONTRAST TECHNIQUE: Multidetector CT imaging of the thoracic and lumbar spine was performed without contrast. Multiplanar CT image reconstructions were also generated. COMPARISON:  None. FINDINGS: CT THORACIC SPINE FINDINGS Alignment: Anteroposterior  alignment is maintained. Vertebrae: Vertebral body heights are preserved.  No acute fracture. Paraspinal and other soft tissues: No acute abnormality. Disc levels: Minor degenerative changes. No high-grade canal stenosis. CT LUMBAR SPINE FINDINGS Segmentation: 5 lumbar type vertebrae. Alignment: Trace anterolisthesis at L4-L5. Superior endplate retropulsion at L1. Vertebrae: Chronic L1 compression fracture with cement augmentation. Vertebral body heights are otherwise maintained. No acute fracture. Paraspinal and other soft tissues: No acute abnormality. Disc levels: L1 superior endplate  retropulsion there is also mild to moderate canal stenosis. Degenerative changes are present without high-grade osseous encroachment on the spinal canal. IMPRESSION: No acute fracture of the thoracolumbar spine. Electronically Signed   By: Macy Mis M.D.   On: 04/26/2021 10:48   DG Pelvis Portable  Result Date: 04/26/2021 CLINICAL DATA:  Trauma.  Fall. EXAM: PORTABLE PELVIS 1-2 VIEWS COMPARISON:  02/12/2017 FINDINGS: Bilateral hip arthroplasties are again noted with incomplete imaging of the femoral components on this single radiograph of the pelvis. No acute pelvic fracture or diastasis is identified. IMPRESSION: No acute osseous abnormality identified. Electronically Signed   By: Logan Bores M.D.   On: 04/26/2021 10:26   DG Chest Portable 1 View  Result Date: 04/26/2021 CLINICAL DATA:  Trauma, fall, deformity wrist EXAM: PORTABLE CHEST 1 VIEW COMPARISON:  12/01/2020 FINDINGS: Enlargement of cardiac silhouette. Mediastinal contours and pulmonary vascularity normal. Atherosclerotic calcification aorta. Lungs clear. No pulmonary infiltrate, pleural effusion, or pneumothorax. Endplate spur formation thoracic spine. IMPRESSION: Enlargement of cardiac silhouette. No acute abnormalities. Aortic Atherosclerosis (ICD10-I70.0). Electronically Signed   By: Lavonia Dana M.D.   On: 04/26/2021 10:27    Procedures .Critical Care  Date/Time: 04/26/2021 11:45 AM Performed by: Luna Fuse, MD Authorized by: Luna Fuse, MD   Critical care provider statement:    Critical care time (minutes):  30   Critical care time was exclusive of:  Separately billable procedures and treating other patients   Critical care was necessary to treat or prevent imminent or life-threatening deterioration of the following conditions:  Trauma   Medications Ordered in ED Medications  HYDROmorphone (DILAUDID) injection 0.5 mg (has no administration in time range)  HYDROmorphone (DILAUDID) injection 0.5 mg (0.5 mg Intravenous  Given 04/26/21 1004)    ED Course  I have reviewed the triage vital signs and the nursing notes.  Pertinent labs & imaging results that were available during my care of the patient were reviewed by me and considered in my medical decision making (see chart for details).    MDM Rules/Calculators/A&P                           Patient is on Eliquis and had a head injury and was a level 2 trauma activation.  CT imaging shows no acute findings.  X-rays concerning for left wrist fracture with scaphoid fracture.  Orthopedic surgery consulted.  Placed in a thumb spica splint to follow-up on an outpatient basis in a week with orthopedic surgery.   Final Clinical Impression(s) / ED Diagnoses Final diagnoses:  Trauma  Closed fracture of left wrist, initial encounter    Rx / DC Orders ED Discharge Orders     None        Luna Fuse, MD 04/26/21 1145

## 2021-05-02 ENCOUNTER — Telehealth: Payer: Self-pay | Admitting: Cardiovascular Disease

## 2021-05-02 NOTE — Telephone Encounter (Signed)
   [facility] Pre-operative Risk Assessment    Patient Name: Kathy [name]  DOB: [birth year] MRN: [id number]  HEARTCARE STAFF:  - IMPORTANT!!!!!! Under Visit Info/Reason for Call, type in Other and utilize the format Clearance MM/DD/YY or Clearance TBD. Do not use dashes or single digits. - Please review there is not already an duplicate clearance open for this procedure. - If request is for dental extraction, please clarify the # of teeth to be extracted. - If the patient is currently at the dentist's office, call Pre-Op Callback Staff (MA/nurse) to input urgent request.  - If the patient is not currently in the dentist office, please route to the Pre-Op pool.  Request for surgical clearance:  What type of surgery is being performed? Fx of the lest Distal Radius  When is this surgery scheduled? 05-04-21  What type of clearance is required (medical clearance vs. Pharmacy clearance to hold med vs. Both)? Medical  Are there any medications that need to be held prior to surgery and how long?   Practice name and name of physician performing surgery? Dr Charlotte Crumb  What is the office phone number? [phone]   7.   What is the office fax number? [phone]  8.   Anesthesia type (None, local, MAC, general) ? Block with Mac   Glyn Ade 05/02/2021, 3:28 PM  _________________________________________________________________   (provider comments below)

## 2021-05-02 NOTE — Telephone Encounter (Signed)
   Name: Kathy [name]  DOB: May 27, 1951  MRN: [id number]   Primary Cardiologist: Mertie Moores, MD  Chart reviewed as part of pre-operative protocol coverage. Patient was contacted 05/02/2021 in reference to pre-operative risk assessment for pending surgery as outlined below.  Kathy [name] was last seen on 09/2020 by Dr. Acie Fredrickson.  Since that day, Kathy [name] has done she has continued to recover slowly from her back surgery. She was still quite limited in activity and unable to complete 4 METs, then on 04/26/21 she had a mechanical fall and broke her wrist which has further limited her activity. An echo 10/2018 was reassuring with EF 60-65%, G2DD, moderate TR, and mild PI. Last ischemic evaluation was a LHC in 2019 which showed 30% ostial RCA stenosis otherwise normal coronaries. She is now planned for surgical repair of her wrist fracture with nerve block and MAC  Dr. Acie Fredrickson - can you weigh in on her preop risk? Favor proceeding without further work up as this would only delay her procedure and complicate healing and surgery further. Please route your response back to P CV DIV PREOP. Thank you!  Abigail Butts, PA-C 05/02/2021, 5:06 PM

## 2021-05-02 NOTE — Telephone Encounter (Signed)
Patient with diagnosis of afib on Eliquis for anticoagulation.    Procedure: Fx of the left Distal Radius Date of procedure: 05/04/21  CHA2DS2-VASc Score = 5  This indicates a 7.2% annual risk of stroke. The patient's score is based upon: CHF History: Yes HTN History: Yes Diabetes History: No Stroke History: No Vascular Disease History: Yes Age Score: 1 Gender Score: 1  Also with hx of DVT following right knee arthroscopy in 2007  CrCl 5m/min using adjusted body weight due to obesity Platelet count 191K  Per office protocol, patient can hold Eliquis for 2 days prior to procedure starting today.

## 2021-05-02 NOTE — Telephone Encounter (Signed)
    Patient Name: Kathy [name]  DOB: October 13, 1950 MRN: [id number]  Primary Cardiologist: Mertie Moores, MD  Chart reviewed as part of pre-operative protocol coverage. Per Dr. Acie Fredrickson, Kathy [name] would be at acceptable risk for the planned procedure without further cardiovascular testing.   Per pharmacy and Dr. Elmarie Shiley recommendations, patient can hold eliquis 2 days prior to her upcoming surgery with plans to restart as soon as she is cleared to do so by her surgeon. Patient instructed to begin hold now in anticipation of surgery 05/04/21.  I will route this recommendation to the requesting party via Epic fax function and remove from pre-op pool.  Please call with questions.  Abigail Butts, PA-C 05/02/2021, 5:24 PM

## 2021-05-02 NOTE — Telephone Encounter (Signed)
   Name: Kathy [name]  DOB: Feb 16, 1951  MRN: [id number]   Primary Cardiologist: Mertie Moores, MD  Chart reviewed as part of pre-operative protocol coverage.   Left a voicemail on home and cell numbers to call back for ongoing preop assessment.   Abigail Butts, PA-C 05/02/2021, 3:52 PM

## 2021-05-31 ENCOUNTER — Encounter: Payer: Self-pay | Admitting: Internal Medicine

## 2021-06-12 ENCOUNTER — Other Ambulatory Visit: Payer: Self-pay | Admitting: Internal Medicine

## 2021-06-12 DIAGNOSIS — F419 Anxiety disorder, unspecified: Secondary | ICD-10-CM

## 2021-06-22 ENCOUNTER — Other Ambulatory Visit: Payer: Self-pay | Admitting: Internal Medicine

## 2021-06-22 DIAGNOSIS — F339 Major depressive disorder, recurrent, unspecified: Secondary | ICD-10-CM

## 2021-07-11 ENCOUNTER — Other Ambulatory Visit: Payer: Self-pay

## 2021-07-12 ENCOUNTER — Ambulatory Visit: Payer: Medicare Other | Admitting: Internal Medicine

## 2021-07-12 ENCOUNTER — Encounter: Payer: Self-pay | Admitting: Internal Medicine

## 2021-07-12 VITALS — BP 122/78 | HR 74 | Temp 97.5°F | Ht 67.0 in | Wt 274.9 lb

## 2021-07-12 DIAGNOSIS — F339 Major depressive disorder, recurrent, unspecified: Secondary | ICD-10-CM

## 2021-07-12 DIAGNOSIS — I480 Paroxysmal atrial fibrillation: Secondary | ICD-10-CM

## 2021-07-12 DIAGNOSIS — M160 Bilateral primary osteoarthritis of hip: Secondary | ICD-10-CM

## 2021-07-12 DIAGNOSIS — Z23 Encounter for immunization: Secondary | ICD-10-CM | POA: Diagnosis not present

## 2021-07-12 DIAGNOSIS — M797 Fibromyalgia: Secondary | ICD-10-CM

## 2021-07-12 DIAGNOSIS — I10 Essential (primary) hypertension: Secondary | ICD-10-CM | POA: Diagnosis not present

## 2021-07-12 MED ORDER — HYDROCODONE-ACETAMINOPHEN 5-325 MG PO TABS: 1.0000 | ORAL_TABLET | Freq: Four times a day (QID) | ORAL | 0 refills | Status: DC | PRN

## 2021-07-12 MED ORDER — HYDROCODONE-ACETAMINOPHEN 5-325 MG PO TABS
1.0000 | ORAL_TABLET | Freq: Four times a day (QID) | ORAL | 0 refills | Status: DC | PRN
Start: 2021-07-12 — End: 2021-08-30

## 2021-07-12 MED ORDER — CYCLOBENZAPRINE HCL 5 MG PO TABS: 5.0000 mg | ORAL_TABLET | Freq: Every evening | ORAL | 1 refills | Status: DC | PRN

## 2021-07-12 NOTE — Patient Instructions (Signed)
-  Nice seeing you today!!  -Schedule follow up in 3 months for your physical. Please come in sting that day.  -Flu vaccine today.

## 2021-07-12 NOTE — Progress Notes (Signed)
Established Patient Office Visit     This visit occurred during the SARS-CoV-2 public health emergency.  Safety protocols were in place, including screening questions prior to the visit, additional usage of staff PPE, and extensive cleaning of exam room while observing appropriate contact time as indicated for disinfecting solutions.    CC/Reason for Visit: Follow-up chronic medical conditions  HPI: Kathy [name] is a 70 y.o. female who is coming in today for the above mentioned reasons. Past Medical History is significant for: Depression, atrial fibrillation anticoagulated on Eliquis, fibromyalgia and a multitude of arthritic joint conditions.  She has been followed by rheumatology and maintained on Flexeril and hydrocodone, patient is wondering whether I am willing to take over these prescriptions today.  Her husband unfortunately passed away from aggressive throat cancer over the summer.  She has had both cataract surgeries this year.  She is still ambulating with a cane.  Since I last saw her she fell at home and suffered a left wrist fracture that required surgical repair by Dr. Burney Gauze.  She has lost 20 pounds.  She is requesting a flu vaccine.  She has not yet scheduled CBT sessions.   Past Medical/Surgical History: Past Medical History:  Diagnosis Date   Adenomatous colon polyp    2012   Anxiety    Anxiety and depression    Arthritis    Osteoarthritis, Dr Maureen Ralphs   Cancer [facility])    skin - left foot excised   Chronic diastolic CHF (congestive heart failure) (HCC)    DDD (degenerative disc disease), cervical    Deaf, right    Depression    Diverticulosis    DVT (deep venous thrombosis) ([place])    RLE DVT 08/2004, post right knee arthroscopy   Dyspnea    occasional- no oxygen   Dysrhythmia    a-fib   Fibromyalgia    Dr Tobie Lords, Rheu   GERD (gastroesophageal reflux disease)    H/O hiatal hernia    Headache(784.0)    PAST HX MILD MIGRAINES - resolved per  patient 08/18/20   Heart murmur    mitral valve prolapse--no symptoms   History of colonic polyps 08/08/2011        History of kidney stones    passed stones   HTN (hypertension)    IBS (irritable bowel syndrome)    Internal hemorrhoid    Lumbar radiculopathy    Obesity    OSA on CPAP    Moderate with AHI 20/hr with nocturnal hypoxemia on CPAP   PAF (paroxysmal atrial fibrillation) ([place]) 06/2018   Pulmonary hypertension ([place])    Syncope    idiopathic, ? related to migraines or hyperventilation. Dr Leonie Man, Neurology, YEARS AGO; 02/03/20 in setting of afib with RVR    Past Surgical History:  Procedure Laterality Date   ABDOMINAL HYSTERECTOMY     dysfunctional menses   APPENDECTOMY     BREAST EXCISIONAL BIOPSY Right    CARDIAC CATHETERIZATION  06/26/2018   CHOLECYSTECTOMY     COLONOSCOPY  05/27/2002, 08/08/11   2003 diverticulosis, hemorrhoids 2012 same + cecal polyp   KNEE ARTHROSCOPY  2008   right   KYPHOPLASTY Bilateral 08/19/2020   Procedure: KYPHOPLASTY LUMBAR ONE;  Surgeon: Vallarie Mare, MD;  Location: [place];  Service: Neurosurgery;  Laterality: Bilateral;  posterior   LEFT HEART CATH AND CORONARY ANGIOGRAPHY N/A 06/26/2018   Procedure: LEFT HEART CATH AND CORONARY ANGIOGRAPHY;  Surgeon: Wellington Hampshire, MD;  Location: [place]  [facility];  Service: Cardiovascular;  Laterality: N/A;   MELANOMA EXCISION Left 07/2013   foot   TOTAL HIP ARTHROPLASTY  02/2010   left ; Dr Maureen Ralphs   TOTAL HIP ARTHROPLASTY  03/18/2012   Procedure: TOTAL HIP ARTHROPLASTY;  Surgeon: Gearlean Alf, MD;  [facility];  Service: Orthopedics;  Laterality: Right;   UPPER GASTROINTESTINAL ENDOSCOPY  04/21/2009, 08/08/11   2010 gastritis ;2012 small hiatal hernia. Dr Carlean Purl    Social History:  reports that she has never smoked. She has never used smokeless tobacco. She reports current alcohol use. She reports that she does not use drugs.  Allergies: Allergies  Allergen Reactions   Achromycin  [Tetracycline Hcl] Shortness Of Breath    Dyspnea     Penicillins     Facial angioedema Did it involve swelling of the face/tongue/throat, SOB, or low BP? Yes Did it involve sudden or severe rash/hives, skin peeling, or any reaction on the inside of your mouth or nose? No Did you need to seek medical attention at a hospital or doctor's office? No When did it last happen?      30+ years If all above answers are "NO", may proceed with cephalosporin use.    Sulfamethoxazole-Trimethoprim Hives   Nickel Itching   Penicillins    Zanaflex [Tizanidine Hydrochloride]     Causes more pain    Zanaflex [Tizanidine]     Family History:  Family History  Problem Relation Age of Onset   Heart attack Mother 20   COPD Mother    Breast cancer Mother 3   Hypertension Father    Diabetes Father    Heart failure Father 25   Breast cancer Paternal Aunt        cns mets   COPD Maternal Grandmother    Heart disease Paternal Grandmother        died 50   Diabetes Paternal Grandmother    Stroke Paternal Grandfather        in late 41s   Heart attack Brother 73   Colon cancer Neg Hx      Current Outpatient Medications:    ALPRAZolam (XANAX) 1 MG tablet, TAKE 1 TABLET BY MOUTH THREE TIMES A DAY AS NEEDED FOR ANXIETY, Disp: 90 tablet, Rfl: 2   apixaban (ELIQUIS) 5 MG TABS tablet, Take 1 tablet (5 mg total) by mouth 2 (two) times daily., Disp: 180 tablet, Rfl: 3   atorvastatin (LIPITOR) 40 MG tablet, Take 1 tablet (40 mg total) by mouth daily. (Patient taking differently: Take 40 mg by mouth at bedtime.), Disp: 90 tablet, Rfl: 3   buPROPion (WELLBUTRIN XL) 150 MG 24 hr tablet, TAKE 1 TABLET BY MOUTH EVERY DAY, Disp: 90 tablet, Rfl: 1   Calcium Carbonate (CALCIUM 600 PO), Take 1 tablet by mouth daily., Disp: , Rfl:    clindamycin (CLEOCIN T) 1 % lotion, Apply 1 application topically 2 (two) times daily as needed (rosacea (skin irritation)). , Disp: , Rfl:    clobetasol (TEMOVATE) 0.05 % external  solution, Apply 1 application topically daily as needed (rosacea)., Disp: , Rfl:    cyclobenzaprine (FLEXERIL) 5 MG tablet, Take 1 tablet (5 mg total) by mouth at bedtime as needed for muscle spasms., Disp: 90 tablet, Rfl: 1   diltiazem (CARDIZEM CD) 120 MG 24 hr capsule, Take 1 capsule (120 mg total) by mouth daily. (Patient taking differently: Take 120 mg by mouth at bedtime.), Disp: 90 capsule, Rfl: 3   fluocinonide (LIDEX) 0.05 % external solution,  Apply 1 application topically daily as needed (rosacea)., Disp: , Rfl:    FLUoxetine (PROZAC) 40 MG capsule, TAKE 1 CAPSULE BY MOUTH EVERY DAY, Disp: 90 capsule, Rfl: 1   furosemide (LASIX) 40 MG tablet, Take 1 tablet (40 mg total) by mouth daily., Disp: 90 tablet, Rfl: 3   HYDROcodone-acetaminophen (NORCO) 5-325 MG tablet, Take 1 tablet by mouth every 6 (six) hours as needed for moderate pain., Disp: 30 tablet, Rfl: 0   HYDROcodone-acetaminophen (NORCO) 5-325 MG tablet, Take 1 tablet by mouth every 6 (six) hours as needed for moderate pain., Disp: 30 tablet, Rfl: 0   HYDROcodone-acetaminophen (NORCO) 5-325 MG tablet, Take 1 tablet by mouth every 6 (six) hours as needed for moderate pain., Disp: 30 tablet, Rfl: 0   metoprolol succinate (TOPROL-XL) 100 MG 24 hr tablet, Take 1 tablet (100 mg total) by mouth daily., Disp: 90 tablet, Rfl: 3   metroNIDAZOLE (METROCREAM) 0.75 % cream, Apply 1 application topically 2 (two) times daily as needed (rosacea). , Disp: , Rfl:    Multiple Vitamin (MULTIVITAMIN WITH MINERALS) TABS tablet, Take 1 tablet by mouth daily. Centrum Silver, Disp: , Rfl:    omeprazole (PRILOSEC) 40 MG capsule, TAKE 1 CAPSULE BY MOUTH EVERY DAY, Disp: 90 capsule, Rfl: 1   oxyCODONE-acetaminophen (PERCOCET/ROXICET) 5-325 MG tablet, Take 1 tablet by mouth every 6 (six) hours as needed for up to 8 doses for severe pain., Disp: 8 tablet, Rfl: 0   potassium chloride (KLOR-CON) 10 MEQ tablet, TAKE 1 TABLET BY MOUTH EVERY DAY, Disp: 90 tablet, Rfl:  1  Review of Systems:  Constitutional: Denies fever, chills, diaphoresis. HEENT: Denies photophobia, eye pain, redness, hearing loss, ear pain, congestion, sore throat, rhinorrhea, sneezing, mouth sores, trouble swallowing, neck pain, neck stiffness and tinnitus.   Respiratory: Denies SOB, DOE, cough, chest tightness,  and wheezing.   Cardiovascular: Denies chest pain, palpitations and leg swelling.  Gastrointestinal: Denies nausea, vomiting, abdominal pain, diarrhea, constipation, blood in stool and abdominal distention.  Genitourinary: Denies dysuria, urgency, frequency, hematuria, flank pain and difficulty urinating.  Endocrine: Denies: hot or cold intolerance, sweats, changes in hair or nails, polyuria, polydipsia. Musculoskeletal: Positive for myalgias, back pain, joint swelling, arthralgias and gait problem.  Skin: Denies pallor, rash and wound.  Neurological: Denies dizziness, seizures, syncope, weakness, light-headedness, numbness and headaches.  Hematological: Denies adenopathy. Easy bruising, personal or family bleeding history  Psychiatric/Behavioral: Denies suicidal ideation, , confusion, nervousness and agitation    Physical Exam: Vitals:   07/12/21 1337  BP: 122/78  Pulse: 74  Temp: (!) 97.5 F (36.4 C)  TempSrc: Oral  SpO2: 97%  Weight: 274 lb 14.4 oz (124.7 kg)  Height: 5\' 7"  (1.702 m)    Body mass index is 43.06 kg/m.   Constitutional: NAD, calm, comfortable Eyes: PERRL, lids and conjunctivae normal ENMT: Mucous membranes are moist.  Respiratory: clear to auscultation bilaterally, no wheezing, no crackles. Normal respiratory effort. No accessory muscle use.  Cardiovascular: Regular rate and rhythm, no murmurs / rubs / gallops. No extremity edema.  Psychiatric: Normal judgment and insight. Alert and oriented x 3. Normal mood.    Impression and Plan:  Depression, recurrent ([place]) -She will be scheduled CBT sessions, she is currently on Prozac and  Wellbutrin, she feels like she is slowly improving after the death of her husband earlier this year.  Paroxysmal atrial fibrillation (HCC) -Rate controlled, anticoagulated on Eliquis, followed by cardiology.  Essential hypertension -Blood pressures well controlled.  Morbid obesity ([place]) -Discussed healthy lifestyle,  including increased physical activity and better food choices to promote weight loss. -She has been congratulated on her weight loss success thus far.  Primary osteoarthritis of both hips Fibromyalgia  -PDMP reviewed, overdose risk score 49 pulm she has 1 red flag for overdose prior prescribers over a 2-year period. -All of her prescribers have been her rheumatologist and her orthopedic team, she is a very compliant patient. -I have agreed to take over the prescription of hydrocodone from her rheumatologist.  She will be prescribed 5/325 mg 1 tablet daily at bedtime, she will be given 30 tablets a month x3 months. -She will sign a pain management contract today.  Need for influenza vaccination -Flu vaccine administered today.  Time spent: 34 minutes reviewing chart, interviewing and examining patient and formulating plan of care.   Patient Instructions  -Nice seeing you today!!  -Schedule follow up in 3 months for your physical. Please come in sting that day.  -Flu vaccine today.    Lelon Frohlich, MD [facility]

## 2021-07-29 ENCOUNTER — Ambulatory Visit: Payer: Medicare Other | Admitting: Cardiovascular Disease

## 2021-08-30 ENCOUNTER — Ambulatory Visit: Payer: Medicare Other | Admitting: Internal Medicine

## 2021-08-30 ENCOUNTER — Telehealth: Payer: Self-pay

## 2021-08-30 ENCOUNTER — Encounter: Payer: Self-pay | Admitting: Internal Medicine

## 2021-08-30 VITALS — BP 130/80 | HR 76 | Ht 65.75 in | Wt 282.0 lb

## 2021-08-30 DIAGNOSIS — Z86010 Personal history of colonic polyps: Secondary | ICD-10-CM

## 2021-08-30 DIAGNOSIS — Z7901 Long term (current) use of anticoagulants: Secondary | ICD-10-CM | POA: Diagnosis not present

## 2021-08-30 DIAGNOSIS — I4819 Other persistent atrial fibrillation: Secondary | ICD-10-CM

## 2021-08-30 NOTE — Telephone Encounter (Signed)
Clinical pharmacist to review Eliquis 

## 2021-08-30 NOTE — Telephone Encounter (Signed)
[facility] Pre-operative Risk Assessment     Request for surgical clearance:     Endoscopy Procedure  What type of surgery is being performed?     colonoscopy  When is this surgery scheduled?     10/20/21  What type of clearance is required ?   Pharmacy  Are there any medications that need to be held prior to surgery and how long? Eliquis, day before and day of colonscopy  Practice name and name of physician performing surgery?      [facility]  What is your office phone and fax number?      Phone- [phone]  [phone]  Anesthesia type (None, local, MAC, general) ?       MAC

## 2021-08-30 NOTE — Progress Notes (Signed)
Kathy [name] 70 y.o. [birth year] [id number]  Assessment & Plan:   Encounter Diagnoses  Name Primary?   History of colonic polyps Yes   Persistent atrial fibrillation (HCC)    Chronic anticoagulation      Schedule surveillance colonoscopy.  Hold Eliquis 2 days prior (day before and day of procedure is okay) before performing colonoscopy to reduce bleeding risk.  Rare but real increased risk of stroke off Eliquis explained to the patient and she accepts this risk.  We will clarify with cardiology.  CC: Isaac Bliss, Rayford Halsted, MD  Subjective:   Chief Complaint: History of colon polyps intermittent rectal bleeding  HPI 70 year old white woman with a history of colon polyps.  Also having some intermittent bleeding from known hemorrhoids still.  Small amounts with wiping.  She has had a rough year her husband died in the summer she has fallen and had a compression fracture and then had a wrist fracture.  Her last colonoscopy was in 2017 and she had 2 diminutive adenomas and she has had adenomas prior to that.  Other GI issues include IBS in the setting of fibromyalgia. Allergies  Allergen Reactions   Achromycin [Tetracycline Hcl] Shortness Of Breath    Dyspnea     Penicillins     Facial angioedema Did it involve swelling of the face/tongue/throat, SOB, or low BP? Yes Did it involve sudden or severe rash/hives, skin peeling, or any reaction on the inside of your mouth or nose? No Did you need to seek medical attention at a hospital or doctor's office? No When did it last happen?      30+ years If all above answers are NO, may proceed with cephalosporin use.    Sulfamethoxazole-Trimethoprim Hives   Nickel Itching   Zanaflex [Tizanidine Hydrochloride]     Causes more pain    Current Meds  Medication Sig   ALPRAZolam (XANAX) 1 MG tablet TAKE 1 TABLET BY MOUTH THREE TIMES A DAY AS NEEDED FOR ANXIETY   apixaban (ELIQUIS) 5 MG TABS tablet Take 1 tablet (5 mg total) by  mouth 2 (two) times daily.   atorvastatin (LIPITOR) 40 MG tablet Take 1 tablet (40 mg total) by mouth daily. (Patient taking differently: Take 40 mg by mouth at bedtime.)   buPROPion (WELLBUTRIN XL) 150 MG 24 hr tablet TAKE 1 TABLET BY MOUTH EVERY DAY   Calcium Carbonate (CALCIUM 600 PO) Take 1 tablet by mouth daily.   clindamycin (CLEOCIN T) 1 % lotion Apply 1 application topically 2 (two) times daily as needed (rosacea (skin irritation)).    clobetasol (TEMOVATE) 0.05 % external solution Apply 1 application topically daily as needed (rosacea).   cyclobenzaprine (FLEXERIL) 5 MG tablet Take 1 tablet (5 mg total) by mouth at bedtime as needed for muscle spasms.   diltiazem (CARDIZEM CD) 120 MG 24 hr capsule Take 1 capsule (120 mg total) by mouth daily. (Patient taking differently: Take 120 mg by mouth at bedtime.)   fluocinonide (LIDEX) 0.05 % external solution Apply 1 application topically daily as needed (rosacea).   FLUoxetine (PROZAC) 40 MG capsule TAKE 1 CAPSULE BY MOUTH EVERY DAY   furosemide (LASIX) 40 MG tablet Take 1 tablet (40 mg total) by mouth daily.   HYDROcodone-acetaminophen (NORCO) 5-325 MG tablet Take 1 tablet by mouth every 6 (six) hours as needed for moderate pain.   metoprolol succinate (TOPROL-XL) 100 MG 24 hr tablet Take 1 tablet (100 mg total) by mouth daily.   metroNIDAZOLE (METROCREAM) 0.75 %  cream Apply 1 application topically 2 (two) times daily as needed (rosacea).    Multiple Vitamin (MULTIVITAMIN WITH MINERALS) TABS tablet Take 1 tablet by mouth daily. Centrum Silver   omeprazole (PRILOSEC) 40 MG capsule TAKE 1 CAPSULE BY MOUTH EVERY DAY   potassium chloride (KLOR-CON) 10 MEQ tablet TAKE 1 TABLET BY MOUTH EVERY DAY   Past Medical History:  Diagnosis Date   Adenomatous colon polyp    2012   Anxiety    Anxiety and depression    Arthritis    Osteoarthritis, Dr Maureen Ralphs   Cancer [facility])    skin - left foot excised   Chronic diastolic CHF (congestive heart failure)  (HCC)    DDD (degenerative disc disease), cervical    Deaf, right    Depression    Diverticulosis    DVT (deep venous thrombosis) ([place])    RLE DVT 08/2004, post right knee arthroscopy   Dyspnea    occasional- no oxygen   Dysrhythmia    a-fib   Fibromyalgia    Dr Tobie Lords, Rheu   GERD (gastroesophageal reflux disease)    H/O hiatal hernia    Headache(784.0)    PAST HX MILD MIGRAINES - resolved per patient 08/18/20   Heart murmur    mitral valve prolapse--no symptoms   History of colonic polyps 08/08/2011        History of kidney stones    passed stones   HTN (hypertension)    IBS (irritable bowel syndrome)    Internal hemorrhoid    Lumbar radiculopathy    Obesity    OSA on CPAP    Moderate with AHI 20/hr with nocturnal hypoxemia on CPAP   PAF (paroxysmal atrial fibrillation) ([place]) 06/2018   Pulmonary hypertension ([place])    Syncope    idiopathic, ? related to migraines or hyperventilation. Dr Leonie Man, Neurology, YEARS AGO; 02/03/20 in setting of afib with RVR   Past Surgical History:  Procedure Laterality Date   ABDOMINAL HYSTERECTOMY     dysfunctional menses   APPENDECTOMY     BREAST EXCISIONAL BIOPSY Right    CARDIAC CATHETERIZATION  06/26/2018   CHOLECYSTECTOMY     COLONOSCOPY  05/27/2002, 08/08/11   2003 diverticulosis, hemorrhoids 2012 same + cecal polyp   KNEE ARTHROSCOPY  2008   right   KYPHOPLASTY Bilateral 08/19/2020   Procedure: KYPHOPLASTY LUMBAR ONE;  Surgeon: Vallarie Mare, MD;  Location: [facility];  Service: Neurosurgery;  Laterality: Bilateral;  posterior   LEFT HEART CATH AND CORONARY ANGIOGRAPHY N/A 06/26/2018   Procedure: LEFT HEART CATH AND CORONARY ANGIOGRAPHY;  Surgeon: Wellington Hampshire, MD;  Location: [place] [facility];  Service: Cardiovascular;  Laterality: N/A;   MELANOMA EXCISION Left 07/2013   foot   TOTAL HIP ARTHROPLASTY  02/2010   left ; Dr Maureen Ralphs   TOTAL HIP ARTHROPLASTY  03/18/2012   Procedure: TOTAL HIP ARTHROPLASTY;  Surgeon:  Gearlean Alf, MD;  [facility];  Service: Orthopedics;  Laterality: Right;   UPPER GASTROINTESTINAL ENDOSCOPY  04/21/2009, 08/08/11   2010 gastritis ;2012 small hiatal hernia. Dr Carlean Purl   WRIST FRACTURE SURGERY Left    Social History   Social History Narrative   0 caffeine drinks    family history includes Breast cancer in her paternal aunt; Breast cancer (age of onset: 56) in her mother; COPD in her maternal grandmother and mother; Diabetes in her father and paternal grandmother; Heart attack (age of onset: 50) in her brother; Heart attack (age of onset: 61)  in her mother; Heart disease in her paternal grandmother; Heart failure (age of onset: 10) in her father; Hypertension in her father; Stroke in her paternal grandfather.   Review of Systems As per HPI  Objective:   Physical Exam BP 130/80 (BP Location: Left Arm, Patient Position: Sitting, Cuff Size: Large)    Pulse 76    Ht 5' 5.75" (1.67 m) Comment: height measured without shoes   Wt 282 lb (127.9 kg)    BMI 45.86 kg/m  Afib pulse Obese WDWN NAD Lungs cta Cor irreg rhythm, NL rate Nl S1S2 and no murmur Abd obese, soft and nontender BS + Alert and oriented x 3  Data reviewed includes prior GI notes and procedures and pathology, labs in the computer from 2022

## 2021-08-30 NOTE — Patient Instructions (Signed)
You have been scheduled for a colonoscopy. Please follow written instructions given to you at your visit today.  Please pick up your prep supplies at the pharmacy within the next 1-3 days. If you use inhalers (even only as needed), please bring them with you on the day of your procedure.  You may have a light breakfast the morning of prep day (the day before the procedure). 10/19/21  You may choose from one of the following items: eggs and toast OR chicken noodle soup and crackers.   You should have your breakfast completed between 8:00 and 9:00 am the day before your procedure.    After you have had your light breakfast you should start a clear liquid diet only, NO SOLIDS. No additional solid food is allowed. You may continue to have clear liquid up to 3 hours prior to your procedure.   You will be contacted by our office prior to your procedure for directions on holding your Eliquis.  If you do not hear from our office 1 week prior to your scheduled procedure, please call [phone] to discuss.   I appreciate the opportunity to care for you. Silvano Rusk, MD, [facility]

## 2021-09-01 NOTE — Telephone Encounter (Signed)
Patient with diagnosis of atrial fibrillation on Eliquis for anticoagulation.    Procedure: colonoscopy Date of procedure: 10/20/21   CHA2DS2-VASc Score = 4   This indicates a 4.8% annual risk of stroke. The patient's score is based upon: CHF History: 1 HTN History: 1 Diabetes History: 0 Stroke History: 0 Vascular Disease History: 0 Age Score: 1 Gender Score: 1   Patient does have history of provoked DVT in 2007  CrCl 76 (with adjusted body weight) Platelet count 191  Per office protocol, patient can hold Eliquis for 1 days prior to procedure and restart day after.  Patient will not need bridging with Lovenox (enoxaparin) around procedure.

## 2021-09-01 NOTE — Telephone Encounter (Signed)
° °  Name: Kathy [name]  DOB: Aug 28, 1951  MRN: [id number]  Primary Cardiologist: Mertie Moores, MD  Chart reviewed as part of pre-operative protocol coverage. Because of Jasreet D Reininger's past medical history and time since last visit, she will require a follow-up visit in order to better assess preoperative cardiovascular risk.   Patient was last seen 10/12/20. By the date of colonoscopy in February she will be past due for her yearly OV. Does not yet have an OV scheduled. Therefore I would suggest we go ahead and get her set up for her 1 year follow-up with a preop at the same time. Would aim for last week of January prior to January 31st to ensure adequate amount of time for holding blood thinner if appropriate. Patient is agreeable with this plan as well.  Pre-op covering staff: - Please schedule appointment and call patient to inform them.  - Please contact requesting surgeon's office via preferred method (i.e, phone, fax) to inform them of need for appointment prior to surgery.   Charlie Pitter, PA-C  09/01/2021, 5:01 PM

## 2021-09-02 ENCOUNTER — Telehealth: Payer: Self-pay | Admitting: Cardiovascular Disease

## 2021-09-02 NOTE — Telephone Encounter (Signed)
Left message for the pt to call back and schedule an appt for pre op clearance with Dr. Acie Fredrickson or APP. Left message even though her procedure is not until 10/2021, the schedules are filling up very quickly. I will update the requesting office pt will need appt.

## 2021-09-02 NOTE — Telephone Encounter (Signed)
Pt has been scheduled for pre op appt 10/06/21. Will forward notes to NP for upcoming appt. Will send FYI to requesting office pt has appt 10/06/21

## 2021-09-02 NOTE — Telephone Encounter (Signed)
PJ FYI - I know you will f/u but just double checking

## 2021-09-06 ENCOUNTER — Emergency Department (HOSPITAL_COMMUNITY): Payer: Medicare Other

## 2021-09-06 ENCOUNTER — Emergency Department (HOSPITAL_COMMUNITY)
Admission: EM | Admit: 2021-09-06 | Discharge: 2021-09-06 | Disposition: A | Discharge status: Home / Self Care | Payer: Medicare Other | Attending: Emergency Medicine | Admitting: Emergency Medicine

## 2021-09-06 DIAGNOSIS — M25512 Pain in left shoulder: Secondary | ICD-10-CM | POA: Diagnosis not present

## 2021-09-06 DIAGNOSIS — Z96643 Presence of artificial hip joint, bilateral: Secondary | ICD-10-CM | POA: Diagnosis not present

## 2021-09-06 DIAGNOSIS — I480 Paroxysmal atrial fibrillation: Secondary | ICD-10-CM | POA: Insufficient documentation

## 2021-09-06 DIAGNOSIS — Z79899 Other long term (current) drug therapy: Secondary | ICD-10-CM | POA: Diagnosis not present

## 2021-09-06 DIAGNOSIS — I5032 Chronic diastolic (congestive) heart failure: Secondary | ICD-10-CM | POA: Diagnosis not present

## 2021-09-06 DIAGNOSIS — I110 Hypertensive heart disease with heart failure: Secondary | ICD-10-CM | POA: Insufficient documentation

## 2021-09-06 DIAGNOSIS — Z85828 Personal history of other malignant neoplasm of skin: Secondary | ICD-10-CM | POA: Diagnosis not present

## 2021-09-06 DIAGNOSIS — Z7901 Long term (current) use of anticoagulants: Secondary | ICD-10-CM | POA: Diagnosis not present

## 2021-09-06 DIAGNOSIS — R110 Nausea: Secondary | ICD-10-CM | POA: Diagnosis not present

## 2021-09-06 DIAGNOSIS — F419 Anxiety disorder, unspecified: Secondary | ICD-10-CM | POA: Diagnosis not present

## 2021-09-06 DIAGNOSIS — R0602 Shortness of breath: Secondary | ICD-10-CM | POA: Diagnosis present

## 2021-09-06 DIAGNOSIS — R42 Dizziness and giddiness: Secondary | ICD-10-CM | POA: Diagnosis not present

## 2021-09-06 LAB — CBC
HCT: 42.7 % (ref 36.0–46.0)
Hemoglobin: 13.5 g/dL (ref 12.0–15.0)
MCH: 29.8 pg (ref 26.0–34.0)
MCHC: 31.6 g/dL (ref 30.0–36.0)
MCV: 94.3 fL (ref 80.0–100.0)
Platelets: 193 10*3/uL (ref 150–400)
RBC: 4.53 MIL/uL (ref 3.87–5.11)
RDW: 15.9 % — ABNORMAL HIGH (ref 11.5–15.5)
WBC: 7.4 10*3/uL (ref 4.0–10.5)
nRBC: 0 % (ref 0.0–0.2)

## 2021-09-06 LAB — BASIC METABOLIC PANEL
Anion gap: 9 (ref 5–15)
BUN: 26 mg/dL — ABNORMAL HIGH (ref 8–23)
CO2: 31 mmol/L (ref 22–32)
Calcium: 9.3 mg/dL (ref 8.9–10.3)
Chloride: 98 mmol/L (ref 98–111)
Creatinine, Ser: 1.06 mg/dL — ABNORMAL HIGH (ref 0.44–1.00)
GFR, Estimated: 57 mL/min — ABNORMAL LOW (ref >60)
Glucose, Bld: 117 mg/dL — ABNORMAL HIGH (ref 70–99)
Potassium: 3.9 mmol/L (ref 3.5–5.1)
Sodium: 138 mmol/L (ref 135–145)

## 2021-09-06 LAB — TROPONIN I (HIGH SENSITIVITY)
Troponin I (High Sensitivity): 4 ng/L (ref <18)
Troponin I (High Sensitivity): 5 ng/L (ref <18)

## 2021-09-06 MED ORDER — APIXABAN 5 MG PO TABS
5.0000 mg | ORAL_TABLET | Freq: Two times a day (BID) | ORAL | Status: DC
  Administered 2021-09-06: 09:00:00 5 mg via ORAL
  Filled 2021-09-06: qty 1

## 2021-09-06 MED ORDER — SUCRALFATE 1 G PO TABS
1.0000 g | ORAL_TABLET | Freq: Once | ORAL | Status: AC
  Administered 2021-09-06: 11:00:00 1 g via ORAL
  Filled 2021-09-06: qty 1

## 2021-09-06 MED ORDER — METOPROLOL SUCCINATE ER 25 MG PO TB24
100.0000 mg | ORAL_TABLET | Freq: Every day | ORAL | Status: DC
  Administered 2021-09-06: 09:00:00 100 mg via ORAL
  Filled 2021-09-06: qty 4

## 2021-09-06 MED ORDER — FAMOTIDINE 20 MG PO TABS
20.0000 mg | ORAL_TABLET | Freq: Once | ORAL | Status: AC
  Administered 2021-09-06: 11:00:00 20 mg via ORAL
  Filled 2021-09-06: qty 1

## 2021-09-06 NOTE — ED Triage Notes (Signed)
EMS stated, woke up with some chest pain , Hx with A-Fib. Pain goes to her back into her left shoulder. Some SOB . Given ASA 324 mg. And Nitro. 4  , 20 g in Left AC

## 2021-09-06 NOTE — Discharge Instructions (Signed)
You are in atrial fibrillation but your heart rate is controlled, which is good. You received a dose of Eliquis and Metoprolol-XR while you were here. Your cardiac workup was negative, which reassures [name] that there is nothing more serious going on with your heart.   You also received medication to help with reflux, which may have contributed to some of the pain.   Call your cardiologist to follow up. It appears that you are almost due for your yearly follow up.   Return for worsening chest pain, shortness of breath, or other symptoms concerning to you.

## 2021-09-06 NOTE — ED Provider Notes (Signed)
[facility] EMERGENCY DEPARTMENT Provider Note   CSN: [id number] Arrival date & time: 09/06/21  0704     History Chief Complaint  Patient presents with   Chest Pain   Atrial Fibrillation   Shortness of Breath   Kathy [name] is 70 y.o. female who presents to the ED today complaining of central chest pain with radiation to her back and her left shoulder.  Patient states "the A. fib woke me up" around 5 AM.  Goes on to say "this is certainly not the worst" that she is experienced from the A. fib.  She describes the pain as sharp under her shoulder.  Has difficulty with deep breaths secondary to pain.  She does have a lumbar spine fracture which she is healing from.  Patient states that her A. fib pain usually presents in this way but it has never awoken her from sleep which is why she became concerned.  This is the first time that she has had an episode since her husband was passed away 6 months ago.  She felt some anxiety and felt slightly nauseous and dizzy. No longer feels nauseous. She denies any abdominal pain, recent illness, vomiting, edema, thyroid disorders, recent drinking. She has been taking all her medications as prescribed without any missed dose. Her cardiologist is Dr. Acie Fredrickson.  Per chart review appears she presented to the ED in March for similar symptoms and was having rate controlled A. fib at that time as well.  At that time CTA negative for PE and serial troponins were negative.      Past Medical History:  Diagnosis Date   Adenomatous colon polyp    2012   Anxiety    Anxiety and depression    Arthritis    Osteoarthritis, Dr Maureen Ralphs   Cancer [facility])    skin - left foot excised   Chronic diastolic CHF (congestive heart failure) (HCC)    DDD (degenerative disc disease), cervical    Deaf, right    Depression    Diverticulosis    DVT (deep venous thrombosis) ([place])    RLE DVT 08/2004, post right knee arthroscopy   Dyspnea    occasional- no  oxygen   Dysrhythmia    a-fib   Fibromyalgia    Dr Tobie Lords, Rheu   GERD (gastroesophageal reflux disease)    H/O hiatal hernia    Headache(784.0)    PAST HX MILD MIGRAINES - resolved per patient 08/18/20   Heart murmur    mitral valve prolapse--no symptoms   History of colonic polyps 08/08/2011        History of kidney stones    passed stones   HTN (hypertension)    IBS (irritable bowel syndrome)    Internal hemorrhoid    Lumbar radiculopathy    Obesity    OSA on CPAP    Moderate with AHI 20/hr with nocturnal hypoxemia on CPAP   PAF (paroxysmal atrial fibrillation) ([place]) 06/2018   Pulmonary hypertension ([place])    Syncope    idiopathic, ? related to migraines or hyperventilation. Dr Leonie Man, Neurology, YEARS AGO; 02/03/20 in setting of afib with RVR    Patient Active Problem List   Diagnosis Date Noted   Chronic anticoagulation 08/30/2021   OSA on CPAP    Depression, recurrent ([place]) 05/06/2019   Persistent atrial fibrillation ([place]) 12/31/2018   Chronic diastolic CHF (congestive heart failure) ([place]) 12/31/2018   Bilateral leg edema 07/29/2018   Paroxysmal atrial fibrillation ([place]) 06/25/2018  Chest pain 06/25/2018   Anxiety 06/25/2018   Difficulty walking 02/13/2017   Low back pain 02/13/2017   Allergic rhinitis 07/31/2016   Malignant melanoma ([place]) 08/31/2013   Other abnormal glucose 01/22/2013   Anemia, unspecified 01/22/2013   OA (osteoarthritis) of hip 03/18/2012   Personal history of DVT (deep vein thrombosis) 01/05/2012   History of colonic polyps 08/08/2011   ARTHRITIS, HIP 01/24/2010   PERSONAL HISTORY OF FAILED MODERATE SEDATION 07/21/2009   Morbid obesity ([place]) 04/05/2009   Esophageal reflux 02/25/2009   HEART VALVE DISEASE 12/28/2008   HEADACHE 04/21/2008   Essential hypertension 10/15/2007   LUMBAR RADICULOPATHY, RIGHT 10/15/2007   FATIGUE 10/15/2007   IRRITABLE BOWEL SYNDROME 07/23/2007   Fibromyalgia 07/23/2007    Past Surgical History:   Procedure Laterality Date   ABDOMINAL HYSTERECTOMY     dysfunctional menses   APPENDECTOMY     BREAST EXCISIONAL BIOPSY Right    CARDIAC CATHETERIZATION  06/26/2018   CHOLECYSTECTOMY     COLONOSCOPY  05/27/2002, 08/08/11   2003 diverticulosis, hemorrhoids 2012 same + cecal polyp   KNEE ARTHROSCOPY  2008   right   KYPHOPLASTY Bilateral 08/19/2020   Procedure: KYPHOPLASTY LUMBAR ONE;  Surgeon: Vallarie Mare, MD;  Location: [facility];  Service: Neurosurgery;  Laterality: Bilateral;  posterior   LEFT HEART CATH AND CORONARY ANGIOGRAPHY N/A 06/26/2018   Procedure: LEFT HEART CATH AND CORONARY ANGIOGRAPHY;  Surgeon: Wellington Hampshire, MD;  Location: [facility];  Service: Cardiovascular;  Laterality: N/A;   MELANOMA EXCISION Left 07/2013   foot   TOTAL HIP ARTHROPLASTY  02/2010   left ; Dr Maureen Ralphs   TOTAL HIP ARTHROPLASTY  03/18/2012   Procedure: TOTAL HIP ARTHROPLASTY;  Surgeon: Gearlean Alf, MD;  Location: [facility];  Service: Orthopedics;  Laterality: Right;   UPPER GASTROINTESTINAL ENDOSCOPY  04/21/2009, 08/08/11   2010 gastritis ;2012 small hiatal hernia. Dr Carlean Purl   WRIST FRACTURE SURGERY Left      OB History   No obstetric history on file.     Family History  Problem Relation Age of Onset   Heart attack Mother 73   COPD Mother    Breast cancer Mother 29   Hypertension Father    Diabetes Father    Heart failure Father 40   Breast cancer Paternal Aunt        cns mets   COPD Maternal Grandmother    Heart disease Paternal Grandmother        died 63   Diabetes Paternal Grandmother    Stroke Paternal Grandfather        in late 22s   Heart attack Brother 49   Colon cancer Neg Hx     Social History   Tobacco Use   Smoking status: Never   Smokeless tobacco: Never  Vaping Use   Vaping Use: Never used  Substance Use Topics   Alcohol use: Yes    Comment: occasional   Drug use: No    Home Medications Prior to Admission medications   Medication Sig Start  Date End Date Taking? Authorizing Provider  ALPRAZolam (XANAX) 1 MG tablet TAKE 1 TABLET BY MOUTH THREE TIMES A DAY AS NEEDED FOR ANXIETY Patient taking differently: Take 1 mg by mouth 3 (three) times daily as needed for anxiety. 06/14/21   Isaac Bliss, Rayford Halsted, MD  apixaban (ELIQUIS) 5 MG TABS tablet Take 1 tablet (5 mg total) by mouth 2 (two) times daily. 10/12/20 10/07/21  Nahser, Wonda Cheng, MD  atorvastatin (LIPITOR) 40 MG tablet Take 1 tablet (40 mg total) by mouth daily. Patient taking differently: Take 40 mg by mouth at bedtime. 10/12/20 10/07/21  Nahser, Wonda Cheng, MD  buPROPion (WELLBUTRIN XL) 150 MG 24 hr tablet TAKE 1 TABLET BY MOUTH EVERY DAY Patient taking differently: Take 150 mg by mouth daily. 06/22/21   Isaac Bliss, Rayford Halsted, MD  Calcium Carbonate (CALCIUM 600 PO) Take 1 tablet by mouth daily.    [provider]  clindamycin (CLEOCIN T) 1 % lotion Apply 1 application topically 2 (two) times daily as needed (rosacea (skin irritation)).     [provider]  clobetasol (TEMOVATE) 0.05 % external solution Apply 1 application topically daily as needed (rosacea).    [provider]  cyclobenzaprine (FLEXERIL) 5 MG tablet Take 1 tablet (5 mg total) by mouth at bedtime as needed for muscle spasms. 07/12/21   Isaac Bliss, Rayford Halsted, MD  diltiazem (CARDIZEM CD) 120 MG 24 hr capsule Take 1 capsule (120 mg total) by mouth daily. Patient taking differently: Take 120 mg by mouth at bedtime. 10/12/20 10/07/21  Nahser, Wonda Cheng, MD  fluocinonide (LIDEX) 0.05 % external solution Apply 1 application topically daily as needed (rosacea).    [provider]  FLUoxetine (PROZAC) 40 MG capsule TAKE 1 CAPSULE BY MOUTH EVERY DAY Patient taking differently: Take 40 mg by mouth daily. 04/22/21   Isaac Bliss, Rayford Halsted, MD  furosemide (LASIX) 40 MG tablet Take 1 tablet (40 mg total) by mouth daily. 10/12/20 10/07/21  Nahser, Wonda Cheng, MD  HYDROcodone-acetaminophen  (NORCO) 5-325 MG tablet Take 1 tablet by mouth every 6 (six) hours as needed for moderate pain. 07/12/21   Isaac Bliss, Rayford Halsted, MD  metoprolol succinate (TOPROL-XL) 100 MG 24 hr tablet Take 1 tablet (100 mg total) by mouth daily. 10/12/20 10/07/21  Nahser, Wonda Cheng, MD  metroNIDAZOLE (METROCREAM) 0.75 % cream Apply 1 application topically 2 (two) times daily as needed (rosacea).     [provider]  Multiple Vitamin (MULTIVITAMIN WITH MINERALS) TABS tablet Take 1 tablet by mouth daily. Centrum Silver    [provider]  omeprazole (PRILOSEC) 40 MG capsule TAKE 1 CAPSULE BY MOUTH EVERY DAY Patient taking differently: Take 40 mg by mouth daily. 04/22/21   Isaac Bliss, Rayford Halsted, MD  potassium chloride (KLOR-CON) 10 MEQ tablet TAKE 1 TABLET BY MOUTH EVERY DAY Patient taking differently: Take 10 mEq by mouth daily. 03/31/21   Nahser, Wonda Cheng, MD    Allergies    Achromycin [tetracycline hcl], Penicillins, Sulfamethoxazole-trimethoprim, Nickel, and Zanaflex [tizanidine hydrochloride]  Review of Systems   Review of Systems  Constitutional:  Negative for fever.  HENT:  Negative for congestion, rhinorrhea and sore throat.   Respiratory:  Positive for cough and shortness of breath.   Cardiovascular:  Positive for chest pain and palpitations. Negative for leg swelling.  Gastrointestinal:  Positive for nausea. Negative for abdominal pain and vomiting.  Musculoskeletal:  Positive for back pain.  Neurological:  Positive for dizziness. Negative for syncope.   Physical Exam Updated Vital Signs BP 132/81    Pulse 75    Temp 98.6 F (37 C) (Oral)    Resp 19    SpO2 96%   Physical Exam Constitutional:      General: She is not in acute distress.    Appearance: She is well-developed. She is obese. She is not ill-appearing or diaphoretic.  HENT:     Head: Normocephalic.  Neck:  Thyroid: No thyromegaly.     Vascular: No JVD.  Cardiovascular:     Rate and Rhythm: Normal  rate. Rhythm irregular.     Heart sounds: Normal heart sounds.  Pulmonary:     Effort: Pulmonary effort is normal. No tachypnea or respiratory distress.     Breath sounds: Normal breath sounds.     Comments: Only able to auscultate anterior posts as patient had difficulty moving 2/2 pain from back injury Chest:     Chest wall: Tenderness present. No mass.     Comments: TTP lower sternum Abdominal:     Palpations: Abdomen is soft. There is no mass.     Tenderness: There is no guarding or rebound.     Comments: TTP epigastric region  Musculoskeletal:     Cervical back: Neck supple.     Left lower leg: No edema.     Comments: Limited ROM 2/2 pain from lumbar spine fx   Lymphadenopathy:     Cervical: No cervical adenopathy.  Skin:    General: Skin is warm.  Neurological:     General: No focal deficit present.     Mental Status: She is alert.  Psychiatric:        Mood and Affect: Mood normal.    ED Results / Procedures / Treatments   Labs (all labs ordered are listed, but only abnormal results are displayed) Labs Reviewed  BASIC METABOLIC PANEL - Abnormal; Notable for the following components:      Result Value   Glucose, Bld 117 (*)    BUN 26 (*)    Creatinine, Ser 1.06 (*)    GFR, Estimated 57 (*)    All other components within normal limits  CBC - Abnormal; Notable for the following components:   RDW 15.9 (*)    All other components within normal limits  TROPONIN I (HIGH SENSITIVITY)  TROPONIN I (HIGH SENSITIVITY)    EKG EKG Interpretation  Date/Time:  Tuesday September 06 2021 07:08:19 EST Ventricular Rate:  78 PR Interval:    QRS Duration: 90 QT Interval:  416 QTC Calculation: 474 R Axis:   59 Text Interpretation: Atrial fibrillation Low voltage QRS Nonspecific T wave abnormality Abnormal ECG Confirmed by Carmin Muskrat [phone]) on 09/06/2021 8:19:37 AM  Radiology DG Chest 2 View  Result Date: 09/06/2021 CLINICAL DATA:  Chest pain. EXAM: CHEST - 2 VIEW  COMPARISON:  04/26/2021 FINDINGS: The lungs are clear without focal pneumonia, edema, pneumothorax or pleural effusion. The cardio pericardial silhouette is enlarged. The visualized bony structures of the thorax show no acute abnormality. IMPRESSION: Cardiomegaly. No active cardiopulmonary disease. Electronically Signed   By: Misty Stanley M.D.   On: 09/06/2021 07:44    Procedures Procedures   Medications Ordered in ED Medications  apixaban (ELIQUIS) tablet 5 mg (5 mg Oral Given 09/06/21 0923)  metoprolol succinate (TOPROL-XL) 24 hr tablet 100 mg (100 mg Oral Given 09/06/21 0923)  sucralfate (CARAFATE) tablet 1 g (1 g Oral Given 09/06/21 1031)  famotidine (PEPCID) tablet 20 mg (20 mg Oral Given 09/06/21 1031)    ED Course  I have reviewed the triage vital signs and the nursing notes.  Pertinent labs & imaging results that were available during my care of the patient were reviewed by me and considered in my medical decision making (see chart for details).    MDM Rules/Calculators/A&P  Kathy [name] is a 70 y.o. female with PMHx of paroxysmal atrial fibrillation, MVP, severe pulmonary HTN, HLD, GERD, HFpEF, L1 compression fx s/p kyphoplasty who presents with acute onset central chest pain with radiation to back and left shoulder and associated SOB and dizziness. Description is certainly concerning for cardiac etiology. EKG notable for rate-controlled a-fib without ST changes. CXR negative. CBC unremarkable. Awaiting serial troponins.   She continues to be in rate-controlled atrial fibrillation. Will administer home medications of Eliquis and Metoprolol-XL to see if she has any symptomatic relief. Doubt dissection or PE at this time. BP normotensive at 118/73. She has presented to the ED for symptomatic atrial fibrillation in the past and also notes this was not the worst it has been. Will continue to keep her on tele for monitoring.   Initial troponin is 4. BMP  without any significant electrolyte abnormality.   Awaiting repeat troponin. On re-evaluation patient feels CP has improved but still feels it. Given substernal/epigastric discomfort will also order GI cocktail to see if this helps to relieve pain.   Second troponin 5. On reevaluation patient states that pain has eased to a 5/10 from an 8-9/10 after GI medications. Will re-evaluate patient in 20 minutes, if pain has not worsened or continues to subside anticipate d/c home.   Patient continues to feel that her pain is improving.  Stable for discharge at this point.  Return precautions discussed.   Final Clinical Impression(s) / ED Diagnoses Final diagnoses:  Paroxysmal atrial fibrillation [facility])    Rx / DC Orders ED Discharge Orders     None        Sharion Settler, DO 09/06/21 [place]    Carmin Muskrat, MD 09/06/21 1234

## 2021-09-06 NOTE — ED Notes (Signed)
Pt verbalizes understanding of discharge instructions. Opportunity for questions and answers were provided. Pt discharged from the ED.   ?

## 2021-09-06 NOTE — ED Triage Notes (Signed)
Pt. Stated, The pain woke me up around 500,

## 2021-09-14 ENCOUNTER — Other Ambulatory Visit: Payer: Self-pay | Admitting: Internal Medicine

## 2021-09-14 DIAGNOSIS — F419 Anxiety disorder, unspecified: Secondary | ICD-10-CM

## 2021-09-28 ENCOUNTER — Other Ambulatory Visit: Payer: Self-pay | Admitting: Cardiovascular Disease

## 2021-10-05 NOTE — Progress Notes (Signed)
Office Visit    Patient Name: Kathy [name] Date of Encounter: 10/06/2021  PCP:  Isaac Bliss, Rayford Halsted, MD   [facility]  Cardiologist:  Mertie Moores, MD  Advanced Practice Provider:  No care team member to display Electrophysiologist:  None    Chief Complaint    Kathy [name] is a 71 y.o. female with a hx of chronic diastolic CHF, morbid obesity, severe pulmonary hypertension, mild CAD, hyperlipidemia, history of chest pain, and atrial fibrillation presents today for follow-up appointment. She is also here for cardiac clearance for her colonoscopy.     Past Medical History    Past Medical History:  Diagnosis Date   Adenomatous colon polyp    2012   Anxiety    Anxiety and depression    Arthritis    Osteoarthritis, Dr Maureen Ralphs   Cancer [facility])    skin - left foot excised   Chronic diastolic CHF (congestive heart failure) (HCC)    DDD (degenerative disc disease), cervical    Deaf, right    Depression    Diverticulosis    DVT (deep venous thrombosis) ([place])    RLE DVT 08/2004, post right knee arthroscopy   Dyspnea    occasional- no oxygen   Dysrhythmia    a-fib   Fibromyalgia    Dr Tobie Lords, Rheu   GERD (gastroesophageal reflux disease)    H/O hiatal hernia    Headache(784.0)    PAST HX MILD MIGRAINES - resolved per patient 08/18/20   Heart murmur    mitral valve prolapse--no symptoms   History of colonic polyps 08/08/2011        History of kidney stones    passed stones   HTN (hypertension)    IBS (irritable bowel syndrome)    Internal hemorrhoid    Lumbar radiculopathy    Obesity    OSA on CPAP    Moderate with AHI 20/hr with nocturnal hypoxemia on CPAP   PAF (paroxysmal atrial fibrillation) ([place]) 06/2018   Pulmonary hypertension ([place])    Syncope    idiopathic, ? related to migraines or hyperventilation. Dr Leonie Man, Neurology, YEARS AGO; 02/03/20 in setting of afib with RVR   Past Surgical History:  Procedure  Laterality Date   ABDOMINAL HYSTERECTOMY     dysfunctional menses   APPENDECTOMY     BREAST EXCISIONAL BIOPSY Right    CARDIAC CATHETERIZATION  06/26/2018   CHOLECYSTECTOMY     COLONOSCOPY  05/27/2002, 08/08/11   2003 diverticulosis, hemorrhoids 2012 same + cecal polyp   KNEE ARTHROSCOPY  2008   right   KYPHOPLASTY Bilateral 08/19/2020   Procedure: KYPHOPLASTY LUMBAR ONE;  Surgeon: Vallarie Mare, MD;  Location: [facility];  Service: Neurosurgery;  Laterality: Bilateral;  posterior   LEFT HEART CATH AND CORONARY ANGIOGRAPHY N/A 06/26/2018   Procedure: LEFT HEART CATH AND CORONARY ANGIOGRAPHY;  Surgeon: Wellington Hampshire, MD;  Location: [facility];  Service: Cardiovascular;  Laterality: N/A;   MELANOMA EXCISION Left 07/2013   foot   TOTAL HIP ARTHROPLASTY  02/2010   left ; Dr Maureen Ralphs   TOTAL HIP ARTHROPLASTY  03/18/2012   Procedure: TOTAL HIP ARTHROPLASTY;  Surgeon: Gearlean Alf, MD;  Location: [facility];  Service: Orthopedics;  Laterality: Right;   UPPER GASTROINTESTINAL ENDOSCOPY  04/21/2009, 08/08/11   2010 gastritis ;2012 small hiatal hernia. Dr Carlean Purl   WRIST FRACTURE SURGERY Left     Allergies  Allergies  Allergen Reactions   Achromycin [Tetracycline  Hcl] Shortness Of Breath    Dyspnea     Penicillins     Facial angioedema Did it involve swelling of the face/tongue/throat, SOB, or low BP? Yes Did it involve sudden or severe rash/hives, skin peeling, or any reaction on the inside of your mouth or nose? No Did you need to seek medical attention at a hospital or doctor's office? No When did it last happen?      30+ years If all above answers are NO, may proceed with cephalosporin use.    Sulfamethoxazole-Trimethoprim Hives   Nickel Itching   Zanaflex [Tizanidine Hydrochloride]     Causes more pain     History of Present Illness    Kathy [name] is a 71 y.o. female with a hx of chronic diastolic CHF, morbid obesity, severe pulmonary hypertension, mild CAD,  hyperlipidemia, history of chest pain, and atrial fibrillation presents today for follow-up appointment.  She had been seen for years by Lorenso Quarry, NP.  She was seen February 2020 for paroxysmal atrial fibrillation and pulmonary hypertension.  She was having difficulties with shortness of breath and needed her CPAP titrated.  She was then seen in September 2020 and her shortness of breath had seemed to improve.  She was then seen June 2021 and was found to be in atrial fibrillation.  She was symptomatic at this time.  She did not require a cardioversion.  She was started on diltiazem.  January 2022 she still had episodes of PAF.  She had occasional chest twinges over her left breast.  She was still not exercising at that time.  She was told to continue Eliquis for anticoagulation for her paroxysmal atrial fibrillation and she was continued on Lasix for pulmonary hypertension.  Today, She feels okay. She has been making a lot of adjustments to her routine since she lost her husband back in June. She has been somewhat limited in her activity due to orthostatic issues. She does try to walk around her house and she has been doing more cooking her herself and less eating out. She is recently asymptomatic from her atrial fibrillation and she has been rate-controlled on her current medication regimen. She had an episode 4 or 5 months ago where she fell on a rocking chair and developed a left chest wall hematoma. Ever since then she has had sharp pains in her chest that occur randomly and go away on their own after about 5 minutes. She states the pain is not exertional and my suspicion for cardiac is low. She did have some lower extremity edema but since starting the daily lasix she has not had any further issues. She is unable to wean compression socks/tights.   Reports no shortness of breath nor dyspnea on exertion. Reports no chest pressure, or tightness. No edema, orthopnea, PND. Reports no palpitations.      EKGs/Labs/Other Studies Reviewed:   The following studies were reviewed today:  12/02/20 CT angio of the chest  CLINICAL DATA:  Chest pain and shortness of breath since this afternoon, pain radiating to left arm, atrial fibrillation   EXAM: CT ANGIOGRAPHY CHEST WITH CONTRAST   TECHNIQUE: Multidetector CT imaging of the chest was performed using the standard protocol during bolus administration of intravenous contrast. Multiplanar CT image reconstructions and MIPs were obtained to evaluate the vascular anatomy.   CONTRAST:  4mL OMNIPAQUE IOHEXOL 350 MG/ML SOLN   COMPARISON:  12/01/2020   FINDINGS: Cardiovascular: This is a technically adequate evaluation of the pulmonary vasculature. No  filling defects or pulmonary emboli.   Heart is enlarged, with prominent biatrial dilatation. No pericardial effusion. Normal caliber of the thoracic aorta. Mild atherosclerosis of the aortic arch.   Mediastinum/Nodes: No enlarged mediastinal, hilar, or axillary lymph nodes. Thyroid gland, trachea, and esophagus demonstrate no significant findings.   Lungs/Pleura: No acute airspace disease, effusion, or pneumothorax. The central airways are widely patent.   Upper Abdomen: No acute abnormality.   Musculoskeletal: No acute or destructive bony lesions. Reconstructed images demonstrate no additional findings.   Review of the MIP images confirms the above findings.   IMPRESSION: 1. No evidence of pulmonary embolus. 2. Cardiomegaly, with prominent biatrial dilatation. 3.  Aortic Atherosclerosis (ICD10-I70.0).     Electronically Signed   By: Randa Ngo M.D.   On: 12/02/2020 02:46  EKG:  EKG is  ordered today.  The ekg ordered today demonstrates rate controlled atrial fibrillation rate 71 bpm  Recent Labs: 12/01/2020: ALT 17; B Natriuretic Peptide 271.7 09/06/2021: BUN 26; Creatinine, Ser 1.06; Hemoglobin 13.5; Platelets 193; Potassium 3.9; Sodium 138  Recent Lipid Panel     Component Value Date/Time   CHOL 235 (H) 04/30/2018 1134   TRIG 169.0 (H) 04/30/2018 1134   HDL 54.70 04/30/2018 1134   CHOLHDL 4 04/30/2018 1134   VLDL 33.8 04/30/2018 1134   LDLCALC 146 (H) 04/30/2018 1134   LDLDIRECT 143.2 01/22/2013 0856    Risk Assessment/Calculations:   CHA2DS2-VASc Score = 4   This indicates a 4.8% annual risk of stroke. The patient's score is based upon: CHF History: 1 HTN History: 1 Diabetes History: 0 Stroke History: 0 Vascular Disease History: 0 Age Score: 1 Gender Score: 1     Home Medications   Current Meds  Medication Sig   ALPRAZolam (XANAX) 1 MG tablet TAKE 1 TABLET BY MOUTH THREE TIMES A DAY AS NEEDED FOR ANXIETY   apixaban (ELIQUIS) 5 MG TABS tablet Take 1 tablet (5 mg total) by mouth 2 (two) times daily.   atorvastatin (LIPITOR) 40 MG tablet Take 1 tablet (40 mg total) by mouth daily.   buPROPion (WELLBUTRIN XL) 150 MG 24 hr tablet TAKE 1 TABLET BY MOUTH EVERY DAY   Calcium Carbonate (CALCIUM 600 PO) Take 1 tablet by mouth daily.   clindamycin (CLEOCIN T) 1 % lotion Apply 1 application topically 2 (two) times daily as needed (rosacea (skin irritation)).    clobetasol (TEMOVATE) 0.05 % external solution Apply 1 application topically daily as needed (rosacea).   cyclobenzaprine (FLEXERIL) 5 MG tablet Take 1 tablet (5 mg total) by mouth at bedtime as needed for muscle spasms.   diltiazem (CARDIZEM CD) 120 MG 24 hr capsule Take 1 capsule (120 mg total) by mouth daily.   fluocinonide (LIDEX) 0.05 % external solution Apply 1 application topically daily as needed (rosacea).   FLUoxetine (PROZAC) 40 MG capsule TAKE 1 CAPSULE BY MOUTH EVERY DAY   furosemide (LASIX) 40 MG tablet Take 1 tablet (40 mg total) by mouth daily.   HYDROcodone-acetaminophen (NORCO) 5-325 MG tablet Take 1 tablet by mouth every 6 (six) hours as needed for moderate pain.   metoprolol succinate (TOPROL-XL) 100 MG 24 hr tablet Take 1 tablet (100 mg total) by mouth daily.    metroNIDAZOLE (METROCREAM) 0.75 % cream Apply 1 application topically 2 (two) times daily as needed (rosacea).    Multiple Vitamin (MULTIVITAMIN WITH MINERALS) TABS tablet Take 1 tablet by mouth daily. Centrum Silver   omeprazole (PRILOSEC) 40 MG capsule TAKE 1 CAPSULE BY MOUTH EVERY  DAY   potassium chloride (KLOR-CON) 10 MEQ tablet TAKE 1 TABLET BY MOUTH EVERY DAY     Review of Systems      All other systems reviewed and are otherwise negative except as noted above.  Physical Exam    VS:  BP 120/80 (BP Location: Left Arm, Patient Position: Sitting, Cuff Size: Normal)    Pulse 71    Ht 5' 5.75" (1.67 m)    Wt 283 lb (128.4 kg)    SpO2 98%    BMI 46.03 kg/m  , BMI Body mass index is 46.03 kg/m.  Wt Readings from Last 3 Encounters:  10/06/21 283 lb (128.4 kg)  08/30/21 282 lb (127.9 kg)  07/12/21 274 lb 14.4 oz (124.7 kg)     GEN: Well nourished, well developed, in no acute distress. HEENT: normal. Neck: Supple, no JVD, carotid bruits, or masses. Cardiac: irregularly irregular, no murmurs, rubs, or gallops. No clubbing, cyanosis, edema.  Radials/PT 2+ and equal bilaterally.  Respiratory:  Respirations regular and unlabored, clear to auscultation bilaterally. GI: Soft, nontender, nondistended. MS: No deformity or atrophy. Skin: Warm and dry, no rash. Neuro:  Strength and sensation are intact. Psych: Normal affect.  Assessment & Plan    {Ms. Barriere's perioperative risk of a major cardiac event is 6.6% according to the Revised Cardiac Risk Index (RCRI).  Therefore, she is at low risk for perioperative complications.   Her functional capacity is good at 4.4 METs according to the Duke Activity Status Index (DASI). Recommendations: According to ACC/AHA guidelines, no further cardiovascular testing needed.  The patient may proceed to surgery at acceptable risk.   Antiplatelet and/or Anticoagulation Recommendations: Eliquis (Apixaban) can be held for 2 days prior to surgery.  Please  resume post op when felt to be safe.    2. Pulmonary hypertension -Continue current diuretic regimen  3. Paroxysmal atrial fibrillation -does not bother her as much anymore -She is on Eliquis and not experiencing any bleeding in her bowels or urine  4. Coronary artery disease -she does has sharp chest pains but does not sound cardiac  5. Morbid obesity -walking in the house but its limited by ortho issues -encouraged to maintain a heart healthy, low-sodium diet  6. Lower extremity edema -she takes lasix 40mg  daily  -Appears euvolemic today -Encourage daily weights   Disposition: Follow up 6 months with Mertie Moores, MD or APP.  Signed, Elgie Collard, PA-C 10/06/2021, 3:14 PM [facility]

## 2021-10-06 ENCOUNTER — Ambulatory Visit (HOSPITAL_BASED_OUTPATIENT_CLINIC_OR_DEPARTMENT_OTHER): Payer: Medicare Other | Admitting: Physician Assistant

## 2021-10-06 ENCOUNTER — Other Ambulatory Visit: Payer: Self-pay

## 2021-10-06 ENCOUNTER — Encounter (HOSPITAL_BASED_OUTPATIENT_CLINIC_OR_DEPARTMENT_OTHER): Payer: Self-pay | Admitting: Physician Assistant

## 2021-10-06 VITALS — BP 120/80 | HR 71 | Ht 65.75 in | Wt 283.0 lb

## 2021-10-06 DIAGNOSIS — E785 Hyperlipidemia, unspecified: Secondary | ICD-10-CM

## 2021-10-06 DIAGNOSIS — I480 Paroxysmal atrial fibrillation: Secondary | ICD-10-CM | POA: Diagnosis not present

## 2021-10-06 DIAGNOSIS — I2510 Atherosclerotic heart disease of native coronary artery without angina pectoris: Secondary | ICD-10-CM | POA: Diagnosis not present

## 2021-10-06 DIAGNOSIS — I272 Pulmonary hypertension, unspecified: Secondary | ICD-10-CM | POA: Diagnosis not present

## 2021-10-06 NOTE — Patient Instructions (Addendum)
Medication Instructions:  Continue your current medications.   You may hold Eliquis 2 days prior to your colonoscopy.   *If you need a refill on your cardiac medications before your next appointment, please call your pharmacy*   Lab Work: None ordered today.   Testing/Procedures: Your EKG today showed rate controlled atrial fibrillation. This is a stable finding.   Follow-Up: At [facility], you and your health needs are our priority.  As part of our continuing mission to provide you with exceptional heart care, we have created designated Provider Care Teams.  These Care Teams include your primary Cardiologist (physician) and Advanced Practice Providers (APPs -  Physician Assistants and Nurse Practitioners) who all work together to provide you with the care you need, when you need it.  We recommend signing up for the patient portal called "MyChart".  Sign up information is provided on this After Visit Summary.  MyChart is used to connect with patients for Virtual Visits (Telemedicine).  Patients are able to view lab/test results, encounter notes, upcoming appointments, etc.  Non-urgent messages can be sent to your provider as well.   To learn more about what you can do with MyChart, go to [email or web address].    Your next appointment:   6 month(s)  The format for your next appointment:   In Person  Provider:   Mertie Moores, MD or Advanced Practice Provider    Other Instructions  Nicholes Rough, PA will send a message to GI that you are cleared for your procedure.    Contact our office if your heart rate is persistently more than 100 bpm or less than 60 bpm.   Exercise recommendations: The American Heart Association recommends 150 minutes of moderate intensity exercise weekly. Try 30 minutes of moderate intensity exercise 4-5 times per week. This could include walking, jogging, or swimming.   Heart Healthy Diet Recommendations: A low-salt diet is recommended. Meats should  be grilled, baked, or boiled. Avoid fried foods. Focus on lean protein sources like fish or chicken with vegetables and fruits. The American Heart Association is a Microbiologist!  American Heart Association Diet and Lifeystyle Recommendations    Heart-Healthy Eating Plan Many factors influence your heart (coronary) health, including eating and exercise habits. Coronary risk increases with abnormal blood fat (lipid) levels. Heart-healthy meal planning includes limiting unhealthy fats, increasing healthy fats, and making other diet and lifestyle changes  What are tips for following this plan? Cooking Cook foods using methods other than frying. Baking, boiling, grilling, and broiling are all good options. Other ways to reduce fat include: Removing the skin from poultry. Removing all visible fats from meats. Steaming vegetables in water or broth. Meal planning  At meals, imagine dividing your plate into fourths: Fill one-half of your plate with vegetables and green salads. Fill one-fourth of your plate with whole grains. Fill one-fourth of your plate with lean protein foods. Eat 4-5 servings of vegetables per day. One serving equals 1 cup raw or cooked vegetable, or 2 cups raw leafy greens. Eat 4-5 servings of fruit per day. One serving equals 1 medium whole fruit,  cup dried fruit,  cup fresh, frozen, or canned fruit, or  cup 100% fruit juice. Eat more foods that contain soluble fiber. Examples include apples, broccoli, carrots, beans, peas, and barley. Aim to get 25-30 g of fiber per day. Increase your consumption of legumes, nuts, and seeds to 4-5 servings per week. One serving of dried beans or legumes equals  cup cooked, 1 serving  of nuts is  cup, and 1 serving of seeds equals 1 tablespoon. Fats Choose healthy fats more often. Choose monounsaturated and polyunsaturated fats, such as olive and canola oils, flaxseeds, walnuts, almonds, and seeds. Eat more omega-3 fats. Choose salmon,  mackerel, sardines, tuna, flaxseed oil, and ground flaxseeds. Aim to eat fish at least 2 times each week. Check food labels carefully to identify foods with trans fats or high amounts of saturated fat. Limit saturated fats. These are found in animal products, such as meats, butter, and cream. Plant sources of saturated fats include palm oil, palm kernel oil, and coconut oil. Avoid foods with partially hydrogenated oils in them. These contain trans fats. Examples are stick margarine, some tub margarines, cookies, crackers, and other baked goods. Avoid fried foods. General information Eat more home-cooked food and less restaurant, buffet, and fast food. Limit or avoid alcohol. Limit foods that are high in starch and sugar. Lose weight if you are overweight. Losing just 5-10% of your body weight can help your overall health and prevent diseases such as diabetes and heart disease. Monitor your salt (sodium) intake, especially if you have high blood pressure. Talk with your health care provider about your sodium intake. Try to incorporate more vegetarian meals weekly. What foods can I eat? Fruits All fresh, canned (in natural juice), or frozen fruits. Vegetables Fresh or frozen vegetables (raw, steamed, roasted, or grilled). Green salads. Grains Most grains. Choose whole wheat and whole grains most of the time. Rice and pasta, including brown rice and pastas made with whole wheat. Meats and other proteins Lean, well-trimmed beef, veal, pork, and lamb. Chicken and [place] without skin. All fish and shellfish. Wild duck, rabbit, pheasant, and venison. Egg whites or low-cholesterol egg substitutes. Dried beans, peas, lentils, and tofu. Seeds and most nuts. Dairy Low-fat or nonfat cheeses, including ricotta and mozzarella. Skim or 1% milk (liquid, powdered, or evaporated). Buttermilk made with low-fat milk. Nonfat or low-fat yogurt. Fats and oils Non-hydrogenated (trans-free) margarines. Vegetable  oils, including soybean, sesame, sunflower, olive, peanut, safflower, corn, canola, and cottonseed. Salad dressings or mayonnaise made with a vegetable oil. Beverages Water (mineral or sparkling). Coffee and tea. Diet carbonated beverages. Sweets and desserts Sherbet, gelatin, and fruit ice. Small amounts of dark chocolate. Limit all sweets and desserts. Seasonings and condiments All seasonings and condiments. The items listed above may not be a complete list of foods and beverages you can eat. Contact a dietitian for more options. What foods are not recommended? Fruits Canned fruit in heavy syrup. Fruit in cream or butter sauce. Fried fruit. Limit coconut. Vegetables Vegetables cooked in cheese, cream, or butter sauce. Fried vegetables. Grains Breads made with saturated or trans fats, oils, or whole milk. Croissants. Sweet rolls. Donuts. High-fat crackers, such as cheese crackers. Meats and other proteins Fatty meats, such as hot dogs, ribs, sausage, bacon, rib-eye roast or steak. High-fat deli meats, such as salami and bologna. Caviar. Domestic duck and goose. Organ meats, such as liver. Dairy Cream, sour cream, cream cheese, and creamed cottage cheese. Whole-milk cheeses. Whole or 2% milk (liquid, evaporated, or condensed). Whole buttermilk. Cream sauce or high-fat cheese sauce. Whole-milk yogurt. Fats and oils Meat fat, or shortening. Cocoa butter, hydrogenated oils, palm oil, coconut oil, palm kernel oil. Solid fats and shortenings, including bacon fat, salt pork, lard, and butter. Nondairy cream substitutes. Salad dressings with cheese or sour cream. Beverages Regular sodas and any drinks with added sugar. Sweets and desserts Frosting. Pudding. Cookies. Cakes. Pies. Milk  chocolate or white chocolate. Buttered syrups. Full-fat ice cream or ice cream drinks. The items listed above may not be a complete list of foods and beverages to avoid. Contact a dietitian for more  information. Summary Heart-healthy meal planning includes limiting unhealthy fats, increasing healthy fats, and making other diet and lifestyle changes. Lose weight if you are overweight. Losing just 5-10% of your body weight can help your overall health and prevent diseases such as diabetes and heart disease. Focus on eating a balance of foods, including fruits and vegetables, low-fat or nonfat dairy, lean protein, nuts and legumes, whole grains, and heart-healthy oils and fats. This information is not intended to replace advice given to you by your health care provider. Make sure you discuss any questions you have with your health care provider. Document Revised: 01/13/2021 Document Reviewed: 01/13/2021 Elsevier Patient Education  2022 [employer].

## 2021-10-11 NOTE — Telephone Encounter (Signed)
Patient returned the call and confirmed she received the message below.

## 2021-10-11 NOTE — Telephone Encounter (Signed)
I see in the office note 10/06/2021 from cardiology that they approved her to hold the Eliquis for 2 days. I have called and left her a message to make sure she is aware of this. I told her to call me back and let me know she got the message.

## 2021-10-12 ENCOUNTER — Ambulatory Visit (INDEPENDENT_AMBULATORY_CARE_PROVIDER_SITE_OTHER): Payer: Medicare Other | Admitting: Internal Medicine

## 2021-10-12 ENCOUNTER — Encounter: Payer: Self-pay | Admitting: Internal Medicine

## 2021-10-12 ENCOUNTER — Other Ambulatory Visit: Payer: Self-pay | Admitting: Cardiovascular Disease

## 2021-10-12 VITALS — BP 130/80 | HR 81 | Temp 97.9°F | Ht 66.14 in | Wt 279.7 lb

## 2021-10-12 DIAGNOSIS — I10 Essential (primary) hypertension: Secondary | ICD-10-CM

## 2021-10-12 DIAGNOSIS — I5032 Chronic diastolic (congestive) heart failure: Secondary | ICD-10-CM

## 2021-10-12 DIAGNOSIS — Z9989 Dependence on other enabling machines and devices: Secondary | ICD-10-CM

## 2021-10-12 DIAGNOSIS — G4733 Obstructive sleep apnea (adult) (pediatric): Secondary | ICD-10-CM

## 2021-10-12 DIAGNOSIS — Z7901 Long term (current) use of anticoagulants: Secondary | ICD-10-CM

## 2021-10-12 DIAGNOSIS — Z0000 Encounter for general adult medical examination without abnormal findings: Secondary | ICD-10-CM | POA: Diagnosis not present

## 2021-10-12 DIAGNOSIS — N183 Chronic kidney disease, stage 3 unspecified: Secondary | ICD-10-CM | POA: Insufficient documentation

## 2021-10-12 DIAGNOSIS — I4819 Other persistent atrial fibrillation: Secondary | ICD-10-CM

## 2021-10-12 DIAGNOSIS — F339 Major depressive disorder, recurrent, unspecified: Secondary | ICD-10-CM

## 2021-10-12 DIAGNOSIS — C4372 Malignant melanoma of left lower limb, including hip: Secondary | ICD-10-CM

## 2021-10-12 DIAGNOSIS — M797 Fibromyalgia: Secondary | ICD-10-CM

## 2021-10-12 LAB — CBC WITH DIFFERENTIAL/PLATELET
Basophils Absolute: 0 10*3/uL (ref 0.0–0.1)
Basophils Relative: 0.2 % (ref 0.0–3.0)
Eosinophils Absolute: 0.2 10*3/uL (ref 0.0–0.7)
Eosinophils Relative: 2.3 % (ref 0.0–5.0)
HCT: 44.3 % (ref 36.0–46.0)
Hemoglobin: 14.6 g/dL (ref 12.0–15.0)
Lymphocytes Relative: 15.7 % (ref 12.0–46.0)
Lymphs Abs: 1.1 10*3/uL (ref 0.7–4.0)
MCHC: 32.9 g/dL (ref 30.0–36.0)
MCV: 90.3 fl (ref 78.0–100.0)
Monocytes Absolute: 0.5 10*3/uL (ref 0.1–1.0)
Monocytes Relative: 7.1 % (ref 3.0–12.0)
Neutro Abs: 5.3 10*3/uL (ref 1.4–7.7)
Neutrophils Relative %: 74.7 % (ref 43.0–77.0)
Platelets: 209 10*3/uL (ref 150.0–400.0)
RBC: 4.9 Mil/uL (ref 3.87–5.11)
RDW: 16.1 % — ABNORMAL HIGH (ref 11.5–15.5)
WBC: 7.1 10*3/uL (ref 4.0–10.5)

## 2021-10-12 LAB — LIPID PANEL
Cholesterol: 142 mg/dL (ref 0–200)
HDL: 51 mg/dL (ref >39.00)
LDL Cholesterol: 71 mg/dL (ref 0–99)
NonHDL: 91.09
Total CHOL/HDL Ratio: 3
Triglycerides: 98 mg/dL (ref 0.0–149.0)
VLDL: 19.6 mg/dL (ref 0.0–40.0)

## 2021-10-12 LAB — COMPREHENSIVE METABOLIC PANEL
ALT: 11 U/L (ref 0–35)
AST: 16 U/L (ref 0–37)
Albumin: 4.6 g/dL (ref 3.5–5.2)
Alkaline Phosphatase: 107 U/L (ref 39–117)
BUN: 22 mg/dL (ref 6–23)
CO2: 33 mEq/L — ABNORMAL HIGH (ref 19–32)
Calcium: 10 mg/dL (ref 8.4–10.5)
Chloride: 97 mEq/L (ref 96–112)
Creatinine, Ser: 1.03 mg/dL (ref 0.40–1.20)
GFR: 55.35 mL/min — ABNORMAL LOW (ref >60.00)
Glucose, Bld: 102 mg/dL — ABNORMAL HIGH (ref 70–99)
Potassium: 3.6 mEq/L (ref 3.5–5.1)
Sodium: 140 mEq/L (ref 135–145)
Total Bilirubin: 1.1 mg/dL (ref 0.2–1.2)
Total Protein: 7.4 g/dL (ref 6.0–8.3)

## 2021-10-12 LAB — VITAMIN B12: Vitamin B-12: 382 pg/mL (ref 211–911)

## 2021-10-12 LAB — HEMOGLOBIN A1C: Hgb A1c MFr Bld: 5.8 % (ref 4.6–6.5)

## 2021-10-12 LAB — TSH: TSH: 1.96 u[IU]/mL (ref 0.35–5.50)

## 2021-10-12 LAB — VITAMIN D 25 HYDROXY (VIT D DEFICIENCY, FRACTURES): VITD: 45.21 ng/mL (ref 30.00–100.00)

## 2021-10-12 NOTE — Patient Instructions (Signed)
-  Nice seeing you today!!  -Lab work today; will notify you once results are available.  -Consider your tdap, pneumonia, shingles and COVID boosters.  -Schedule follow up in 6 months or sooner as needed.

## 2021-10-12 NOTE — Progress Notes (Signed)
Established Patient Office Visit     This visit occurred during the SARS-CoV-2 public health emergency.  Safety protocols were in place, including screening questions prior to the visit, additional usage of staff PPE, and extensive cleaning of exam room while observing appropriate contact time as indicated for disinfecting solutions.    CC/Reason for Visit: Annual preventive exam and subsequent Medicare wellness visit  HPI: Kathy [name] is a 71 y.o. female who is coming in today for the above mentioned reasons. Past Medical History is significant for: Depression, A. fib on Eliquis, fibromyalgia and a multitude of other arthritic conditions.  She feels well other than some postnasal drip for which an antihistamine has been recommended.  She is overdue for Tdap, pneumonia, shingles and COVID boosters.  She has a colonoscopy scheduled soon, she no longer does Pap smears at the recommendation of her GYN.  She had a bone density test in 2022, mammogram in June 2022.  She follows with her dermatologist every 6 months due to her history of malignant melanoma.   Past Medical/Surgical History: Past Medical History:  Diagnosis Date   Adenomatous colon polyp    2012   Anxiety    Anxiety and depression    Arthritis    Osteoarthritis, Dr Maureen Ralphs   Cancer [facility])    skin - left foot excised   Chronic diastolic CHF (congestive heart failure) (HCC)    DDD (degenerative disc disease), cervical    Deaf, right    Depression    Diverticulosis    DVT (deep venous thrombosis) ([place])    RLE DVT 08/2004, post right knee arthroscopy   Dyspnea    occasional- no oxygen   Dysrhythmia    a-fib   Fibromyalgia    Dr Tobie Lords, Rheu   GERD (gastroesophageal reflux disease)    H/O hiatal hernia    Headache(784.0)    PAST HX MILD MIGRAINES - resolved per patient 08/18/20   Heart murmur    mitral valve prolapse--no symptoms   History of colonic polyps 08/08/2011        History of kidney  stones    passed stones   HTN (hypertension)    IBS (irritable bowel syndrome)    Internal hemorrhoid    Lumbar radiculopathy    Obesity    OSA on CPAP    Moderate with AHI 20/hr with nocturnal hypoxemia on CPAP   PAF (paroxysmal atrial fibrillation) ([place]) 06/2018   Pulmonary hypertension ([place])    Syncope    idiopathic, ? related to migraines or hyperventilation. Dr Leonie Man, Neurology, YEARS AGO; 02/03/20 in setting of afib with RVR    Past Surgical History:  Procedure Laterality Date   ABDOMINAL HYSTERECTOMY     dysfunctional menses   APPENDECTOMY     BREAST EXCISIONAL BIOPSY Right    CARDIAC CATHETERIZATION  06/26/2018   CHOLECYSTECTOMY     COLONOSCOPY  05/27/2002, 08/08/11   2003 diverticulosis, hemorrhoids 2012 same + cecal polyp   KNEE ARTHROSCOPY  2008   right   KYPHOPLASTY Bilateral 08/19/2020   Procedure: KYPHOPLASTY LUMBAR ONE;  Surgeon: Vallarie Mare, MD;  Location: [facility];  Service: Neurosurgery;  Laterality: Bilateral;  posterior   LEFT HEART CATH AND CORONARY ANGIOGRAPHY N/A 06/26/2018   Procedure: LEFT HEART CATH AND CORONARY ANGIOGRAPHY;  Surgeon: Wellington Hampshire, MD;  Location: [facility];  Service: Cardiovascular;  Laterality: N/A;   MELANOMA EXCISION Left 07/2013   foot   TOTAL HIP ARTHROPLASTY  02/2010   left ; Dr Maureen Ralphs   TOTAL HIP ARTHROPLASTY  03/18/2012   Procedure: TOTAL HIP ARTHROPLASTY;  Surgeon: Gearlean Alf, MD;  Location: [facility];  Service: Orthopedics;  Laterality: Right;   UPPER GASTROINTESTINAL ENDOSCOPY  04/21/2009, 08/08/11   2010 gastritis ;2012 small hiatal hernia. Dr Carlean Purl   WRIST FRACTURE SURGERY Left     Social History:  reports that she has never smoked. She has never used smokeless tobacco. She reports current alcohol use. She reports that she does not use drugs.  Allergies: Allergies  Allergen Reactions   Achromycin [Tetracycline Hcl] Shortness Of Breath    Dyspnea     Penicillins     Facial angioedema Did it  involve swelling of the face/tongue/throat, SOB, or low BP? Yes Did it involve sudden or severe rash/hives, skin peeling, or any reaction on the inside of your mouth or nose? No Did you need to seek medical attention at a hospital or doctor's office? No When did it last happen?      30+ years If all above answers are NO, may proceed with cephalosporin use.    Sulfamethoxazole-Trimethoprim Hives   Nickel Itching   Zanaflex [Tizanidine Hydrochloride]     Causes more pain     Family History:  Family History  Problem Relation Age of Onset   Heart attack Mother 49   COPD Mother    Breast cancer Mother 75   Hypertension Father    Diabetes Father    Heart failure Father 81   Breast cancer Paternal Aunt        cns mets   COPD Maternal Grandmother    Heart disease Paternal Grandmother        died 90   Diabetes Paternal Grandmother    Stroke Paternal Grandfather        in late 25s   Heart attack Brother 33   Colon cancer Neg Hx      Current Outpatient Medications:    ALPRAZolam (XANAX) 1 MG tablet, TAKE 1 TABLET BY MOUTH THREE TIMES A DAY AS NEEDED FOR ANXIETY, Disp: 90 tablet, Rfl: 2   apixaban (ELIQUIS) 5 MG TABS tablet, Take 1 tablet (5 mg total) by mouth 2 (two) times daily., Disp: 180 tablet, Rfl: 3   atorvastatin (LIPITOR) 40 MG tablet, Take 1 tablet (40 mg total) by mouth daily., Disp: 90 tablet, Rfl: 3   buPROPion (WELLBUTRIN XL) 150 MG 24 hr tablet, TAKE 1 TABLET BY MOUTH EVERY DAY, Disp: 90 tablet, Rfl: 1   Calcium Carbonate (CALCIUM 600 PO), Take 1 tablet by mouth daily., Disp: , Rfl:    clindamycin (CLEOCIN T) 1 % lotion, Apply 1 application topically 2 (two) times daily as needed (rosacea (skin irritation)). , Disp: , Rfl:    clobetasol (TEMOVATE) 0.05 % external solution, Apply 1 application topically daily as needed (rosacea)., Disp: , Rfl:    cyclobenzaprine (FLEXERIL) 5 MG tablet, Take 1 tablet (5 mg total) by mouth at bedtime as needed for muscle spasms., Disp: 90  tablet, Rfl: 1   diltiazem (CARDIZEM CD) 120 MG 24 hr capsule, Take 1 capsule (120 mg total) by mouth daily., Disp: 90 capsule, Rfl: 3   fluocinonide (LIDEX) 0.05 % external solution, Apply 1 application topically daily as needed (rosacea)., Disp: , Rfl:    FLUoxetine (PROZAC) 40 MG capsule, TAKE 1 CAPSULE BY MOUTH EVERY DAY, Disp: 90 capsule, Rfl: 1   furosemide (LASIX) 40 MG tablet, Take 1 tablet (40 mg total) by  mouth daily., Disp: 90 tablet, Rfl: 3   HYDROcodone-acetaminophen (NORCO) 5-325 MG tablet, Take 1 tablet by mouth every 6 (six) hours as needed for moderate pain., Disp: 30 tablet, Rfl: 0   metoprolol succinate (TOPROL-XL) 100 MG 24 hr tablet, Take 1 tablet (100 mg total) by mouth daily., Disp: 90 tablet, Rfl: 3   metroNIDAZOLE (METROCREAM) 0.75 % cream, Apply 1 application topically 2 (two) times daily as needed (rosacea). , Disp: , Rfl:    Multiple Vitamin (MULTIVITAMIN WITH MINERALS) TABS tablet, Take 1 tablet by mouth daily. Centrum Silver, Disp: , Rfl:    omeprazole (PRILOSEC) 40 MG capsule, TAKE 1 CAPSULE BY MOUTH EVERY DAY, Disp: 90 capsule, Rfl: 1   potassium chloride (KLOR-CON) 10 MEQ tablet, TAKE 1 TABLET BY MOUTH EVERY DAY, Disp: 90 tablet, Rfl: 0  Review of Systems:  Constitutional: Denies fever, chills, diaphoresis, appetite change and fatigue.  HEENT: Denies photophobia, eye pain, redness, hearing loss, ear pain, congestion, sore throat, rhinorrhea, sneezing, mouth sores, trouble swallowing, neck pain, neck stiffness and tinnitus.   Respiratory: Denies SOB, DOE, cough, chest tightness,  and wheezing.   Cardiovascular: Denies chest pain, palpitations and leg swelling.  Gastrointestinal: Denies nausea, vomiting, abdominal pain, diarrhea, constipation, blood in stool and abdominal distention.  Genitourinary: Denies dysuria, urgency, frequency, hematuria, flank pain and difficulty urinating.  Endocrine: Denies: hot or cold intolerance, sweats, changes in hair or nails,  polyuria, polydipsia. Musculoskeletal: Positive for myalgias, back pain, joint swelling, arthralgias and gait problem.  Skin: Denies pallor, rash and wound.  Neurological: Denies dizziness, seizures, syncope, weakness, light-headedness, numbness and headaches.  Hematological: Denies adenopathy. Easy bruising, personal or family bleeding history  Psychiatric/Behavioral: Denies suicidal ideation, mood changes, confusion, nervousness, sleep disturbance and agitation    Physical Exam: Vitals:   10/12/21 0843  BP: 130/80  Pulse: 81  Temp: 97.9 F (36.6 C)  TempSrc: Oral  SpO2: 97%  Weight: 279 lb 11.2 oz (126.9 kg)  Height: 5' 6.14" (1.68 m)    Body mass index is 44.95 kg/m.   Constitutional: NAD, calm, comfortable, obese Eyes: PERRL, lids and conjunctivae normal, wears corrective lenses ENMT: Mucous membranes are moist. Posterior pharynx clear of any exudate or lesions. Normal dentition. Tympanic membrane is pearly white, no erythema or bulging. Neck: normal, supple, no masses, no thyromegaly Respiratory: clear to auscultation bilaterally, no wheezing, no crackles. Normal respiratory effort. No accessory muscle use.  Cardiovascular: Regular rate and rhythm, no murmurs / rubs / gallops. No extremity edema. 2+ pedal pulses. No carotid bruits.  Abdomen: no tenderness, no masses palpated. No hepatosplenomegaly. Bowel sounds positive.  Musculoskeletal: no clubbing / cyanosis. No joint deformity upper and lower extremities. Good ROM, no contractures. Normal muscle tone.  Skin: no rashes, lesions, ulcers. No induration Neurologic: CN 2-12 grossly intact. Sensation intact, DTR normal. Strength 5/5 in all 4.  Psychiatric: Normal judgment and insight. Alert and oriented x 3. Normal mood.    Subsequent Medicare wellness visit   1. Risk factors, based on past  M,S,F -cardiovascular disease risk factors include age, obesity.   2.  Physical activities: Walking only as she is recovering from  back surgery and fracture   3.  Depression/mood: History of depression but mood is stable   4.  Hearing: Chronic deafness out of right ear, no changes in the past year followed by ENT   5.  ADL's: Independent in all ADLs   6.  Fall risk: Low fall risk   7.  Home safety: No  problems identified   8.  Height weight, and visual acuity: height and weight as above, vision:  Vision Screening   Right eye Left eye Both eyes  Without correction 20/20 20/20 20/15   With correction        9.  Counseling: Advise she update her vaccination status   10. Lab orders based on risk factors: Laboratory update will be reviewed   11. Referral : None today   12. Care plan: Follow-up with me in 6 months   13. Cognitive assessment: No cognitive impairment   14. Screening: Patient provided with a written and personalized 5-10 year screening schedule in the AVS. yes   15. Provider List Update: PCP, cardiology  16. Advance Directives: Full code   17. Opioids: Patient is on chronic opioids. She has a signed pain contract with me. Has not displayed any signs of an opioid-use disorder.   [place] Office Visit from 10/12/2021 in [place] at [place]  PHQ-9 Total Score 8       Fall Risk 12/01/2020 04/26/2021 07/12/2021 09/06/2021 09/06/2021  Falls in the past year? - - 1 - -  Was there an injury with Fall? - - 1 - -  Was there an injury with Fall? - - - - -  Fall Risk Category Calculator - - 3 - -  Fall Risk Category - - High - -  Patient Fall Risk Level Low fall risk Moderate fall risk - Low fall risk Low fall risk     Impression and Plan:  Encounter for preventive health examination -Recommend routine eye and dental care. -Immunizations: Overdue for Tdap, pneumonia, shingles, COVID booster, declines all today despite counseling -Healthy lifestyle discussed in detail. -Labs to be updated today. -Colon cancer screening: 12/2015, repeat is already scheduled -Breast cancer  screening: 02/2021 -Cervical cancer screening: No longer does Pap smears at the recommendation of GYN -Lung cancer screening: Not applicable -Prostate cancer screening: Not applicable -DEXA: 05/3817  Persistent atrial fibrillation (HCC) Chronic anticoagulation -Appears to be in sinus rhythm today, followed by cardiology, on Eliquis.  OSA on CPAP -Noted  Chronic diastolic CHF (congestive heart failure) ([place])  - Plan: CBC with Differential/Platelet, Comprehensive metabolic panel, Hemoglobin A1c, Lipid panel -Compensated, followed by cardiology.  Depression, recurrent ([place]) -She is chronically depressed but mood is stable.  Malignant melanoma of left lower extremity including hip ([place]) -Followed by dermatology.  Essential hypertension -Well-controlled.  Fibromyalgia -Takes hydrocodone as needed.  Morbid obesity ([place])  - Plan: TSH, Vitamin B12, VITAMIN D 25 Hydroxy (Vit-D Deficiency, Fractures) -Discussed healthy lifestyle, including increased physical activity and better food choices to promote weight loss.     Patient Instructions  -Nice seeing you today!!  -Lab work today; will notify you once results are available.  -Consider your tdap, pneumonia, shingles and COVID boosters.  -Schedule follow up in 6 months or sooner as needed.      Lelon Frohlich, MD [facility]

## 2021-10-13 NOTE — Telephone Encounter (Signed)
Explained to patient what CKD is. Reassured her that she is doing all the right things (already avoids NSAIDs) & that Dr Lemmie Evens is watching her blood work for worsening CKD. Pt verb understanding & is appreciative of the phone call.

## 2021-10-20 ENCOUNTER — Encounter: Payer: Self-pay | Admitting: Internal Medicine

## 2021-10-20 ENCOUNTER — Other Ambulatory Visit: Payer: Self-pay

## 2021-10-20 ENCOUNTER — Ambulatory Visit (AMBULATORY_SURGERY_CENTER): Payer: Medicare Other | Admitting: Internal Medicine

## 2021-10-20 VITALS — BP 118/62 | HR 74 | Temp 96.8°F | Resp 16 | Ht 65.75 in | Wt 282.0 lb

## 2021-10-20 DIAGNOSIS — D120 Benign neoplasm of cecum: Secondary | ICD-10-CM

## 2021-10-20 DIAGNOSIS — D122 Benign neoplasm of ascending colon: Secondary | ICD-10-CM

## 2021-10-20 DIAGNOSIS — Z86010 Personal history of colonic polyps: Secondary | ICD-10-CM | POA: Diagnosis not present

## 2021-10-20 DIAGNOSIS — D124 Benign neoplasm of descending colon: Secondary | ICD-10-CM

## 2021-10-20 DIAGNOSIS — D123 Benign neoplasm of transverse colon: Secondary | ICD-10-CM

## 2021-10-20 MED ORDER — SODIUM CHLORIDE 0.9 % IV SOLN: 500.0000 mL | Freq: Once | INTRAVENOUS | Status: DC

## 2021-10-20 NOTE — Progress Notes (Signed)
PT taken to PACU. Monitors in place. VSS. Report given to RN. 

## 2021-10-20 NOTE — Progress Notes (Signed)
Pt's states no medical or surgical changes since previsit or office visit.  ° °VS DT °

## 2021-10-20 NOTE — Progress Notes (Signed)
Called to room to assist during endoscopic procedure.  Patient ID and intended procedure confirmed with present staff. Received instructions for my participation in the procedure from the performing physician.  

## 2021-10-20 NOTE — Op Note (Signed)
[facility] Patient Name: Kathy [name] Procedure Date: 10/20/2021 1:26 PM MRN: [id number] Endoscopist: Gatha Mayer , MD Age: 71 Referring MD:  Date of Birth: [birth year] Gender: Female Account #: [id number] Procedure:                Colonoscopy Indications:              Surveillance: Personal history of adenomatous                            polyps on last colonoscopy > 5 years ago Medicines:                Propofol per Anesthesia, Monitored Anesthesia Care Procedure:                Pre-Anesthesia Assessment:                           - Prior to the procedure, a History and Physical                            was performed, and patient medications and                            allergies were reviewed. The patient's tolerance of                            previous anesthesia was also reviewed. The risks                            and benefits of the procedure and the sedation                            options and risks were discussed with the patient.                            All questions were answered, and informed consent                            was obtained. Prior Anticoagulants: The patient                            last took Eliquis (apixaban) 2 days prior to the                            procedure. ASA Grade Assessment: III - A patient                            with severe systemic disease. After reviewing the                            risks and benefits, the patient was deemed in                            satisfactory condition to undergo the procedure.  After obtaining informed consent, the colonoscope                            was passed under direct vision. Throughout the                            procedure, the patient's blood pressure, pulse, and                            oxygen saturations were monitored continuously. The                            Olympus CF-HQ190L ([id number]) Colonoscope was                             introduced through the anus and advanced to the the                            cecum, identified by appendiceal orifice and                            ileocecal valve. The colonoscopy was performed                            without difficulty. The patient tolerated the                            procedure well. The quality of the bowel                            preparation was good. The ileocecal valve,                            appendiceal orifice, and rectum were photographed. Scope In: 1:36:54 PM Scope Out: 2:03:33 PM Scope Withdrawal Time: 0 hours 21 minutes 15 seconds  Total Procedure Duration: 0 hours 26 minutes 39 seconds  Findings:                 The perianal and digital rectal examinations were                            normal.                           Six sessile polyps were found in the descending                            colon, transverse colon, ascending colon and cecum.                            The polyps were 3 to 8 mm in size. These polyps                            were removed with a cold snare. Resection and  retrieval were complete. Verification of patient                            identification for the specimen was done. Estimated                            blood loss was minimal.                           Multiple small and large-mouthed diverticula were                            found in the sigmoid colon. There was narrowing of                            the colon in association with the diverticular                            opening.                           Internal hemorrhoids were found.                           The exam was otherwise without abnormality on                            direct and retroflexion views. Complications:            No immediate complications. Estimated Blood Loss:     Estimated blood loss was minimal. Impression:               - Six 3 to 8 mm polyps in the descending colon, in                             the transverse colon, in the ascending colon and in                            the cecum, removed with a cold snare. Resected and                            retrieved.                           - Severe diverticulosis in the sigmoid colon. There                            was narrowing of the colon in association with the                            diverticular opening.                           - Internal hemorrhoids.                           -  The examination was otherwise normal on direct                            and retroflexion views.                           - Personal history of colonic polyps. 07/2011 -                            advanced adenoma                           12/29/2015 two 1 mm cecal polyps removed - adenomas Recommendation:           - Patient has a contact number available for                            emergencies. The signs and symptoms of potential                            delayed complications were discussed with the                            patient. Return to normal activities tomorrow.                            Written discharge instructions were provided to the                            patient.                           - Resume previous diet.                           - Continue present medications.                           - Resume Eliquis (apixaban) at prior dose tomorrow.                           - Await pathology results.                           - Repeat colonoscopy is recommended for                            surveillance. The colonoscopy date will be                            determined after pathology results from today's                            exam become available for review. Gatha Mayer, MD 10/20/2021 2:17:16 PM This report has been signed electronically.

## 2021-10-20 NOTE — Progress Notes (Signed)
[facility] History and Physical   Primary Care Physician:  Isaac Bliss, Rayford Halsted, MD   Reason for Procedure:  History of colon polyps  Plan:    Colonoscopy     HPI: Kathy [name] is a 71 y.o. female here for surveillance colonoscopy 71 year old white woman with a history of colon polyps.  Also having some intermittent bleeding from known hemorrhoids still.  Small amounts with wiping.  She has had a rough year her husband died in the summer she has fallen and had a compression fracture and then had a wrist fracture.  Her last colonoscopy was in 2017 and she had 2 diminutive adenomas and she has had adenomas prior to that.  Other GI issues include IBS in the setting of fibromyalgia.    Past Medical History:  Diagnosis Date   Adenomatous colon polyp    2012   Anxiety    Anxiety and depression    Arthritis    Osteoarthritis, Dr Maureen Ralphs   Cancer [facility])    skin - left foot excised   Chronic diastolic CHF (congestive heart failure) (HCC)    DDD (degenerative disc disease), cervical    Deaf, right    Depression    Diverticulosis    DVT (deep venous thrombosis) ([place])    RLE DVT 08/2004, post right knee arthroscopy   Dyspnea    occasional- no oxygen   Dysrhythmia    a-fib   Fibromyalgia    Dr Tobie Lords, Rheu   GERD (gastroesophageal reflux disease)    H/O hiatal hernia    Headache(784.0)    PAST HX MILD MIGRAINES - resolved per patient 08/18/20   Heart murmur    mitral valve prolapse--no symptoms   History of colonic polyps 08/08/2011        History of kidney stones    passed stones   HTN (hypertension)    IBS (irritable bowel syndrome)    Internal hemorrhoid    Lumbar radiculopathy    Obesity    OSA on CPAP    Moderate with AHI 20/hr with nocturnal hypoxemia on CPAP   PAF (paroxysmal atrial fibrillation) ([place]) 06/2018   Pulmonary hypertension ([place])    Syncope    idiopathic, ? related to migraines or hyperventilation. Dr Leonie Man, Neurology,  YEARS AGO; 02/03/20 in setting of afib with RVR    Past Surgical History:  Procedure Laterality Date   ABDOMINAL HYSTERECTOMY     dysfunctional menses   APPENDECTOMY     BREAST EXCISIONAL BIOPSY Right    CARDIAC CATHETERIZATION  06/26/2018   CHOLECYSTECTOMY     COLONOSCOPY  05/27/2002, 08/08/11   2003 diverticulosis, hemorrhoids 2012 same + cecal polyp   KNEE ARTHROSCOPY  2008   right   KYPHOPLASTY Bilateral 08/19/2020   Procedure: KYPHOPLASTY LUMBAR ONE;  Surgeon: Vallarie Mare, MD;  Location: [facility];  Service: Neurosurgery;  Laterality: Bilateral;  posterior   LEFT HEART CATH AND CORONARY ANGIOGRAPHY N/A 06/26/2018   Procedure: LEFT HEART CATH AND CORONARY ANGIOGRAPHY;  Surgeon: Wellington Hampshire, MD;  Location: [facility];  Service: Cardiovascular;  Laterality: N/A;   MELANOMA EXCISION Left 07/2013   foot   TOTAL HIP ARTHROPLASTY  02/2010   left ; Dr Maureen Ralphs   TOTAL HIP ARTHROPLASTY  03/18/2012   Procedure: TOTAL HIP ARTHROPLASTY;  Surgeon: Gearlean Alf, MD;  Location: [facility];  Service: Orthopedics;  Laterality: Right;   UPPER GASTROINTESTINAL ENDOSCOPY  04/21/2009, 08/08/11   2010 gastritis ;2012 small hiatal  hernia. Dr Carlean Purl   WRIST FRACTURE SURGERY Left     Prior to Admission medications   Medication Sig Start Date End Date Taking? Authorizing Provider  ALPRAZolam [name]) 1 MG tablet TAKE 1 TABLET BY MOUTH THREE TIMES A DAY AS NEEDED FOR ANXIETY 09/14/21  Yes Isaac Bliss, Rayford Halsted, MD  buPROPion (WELLBUTRIN XL) 150 MG 24 hr tablet TAKE 1 TABLET BY MOUTH EVERY DAY 06/22/21  Yes Isaac Bliss, Rayford Halsted, MD  Calcium Carbonate (CALCIUM 600 PO) Take 1 tablet by mouth daily.   Yes [provider]  FLUoxetine (PROZAC) 40 MG capsule TAKE 1 CAPSULE BY MOUTH EVERY DAY 04/22/21  Yes Isaac Bliss, Rayford Halsted, MD  furosemide (LASIX) 40 MG tablet Take 1 tablet (40 mg total) by mouth daily. Please keep upcoming appointment in June 2023 for future refills. Thank  you 10/12/21  Yes Nahser, Wonda Cheng, MD  HYDROcodone-acetaminophen (NORCO) 5-325 MG tablet Take 1 tablet by mouth every 6 (six) hours as needed for moderate pain. 07/12/21  Yes Isaac Bliss, Rayford Halsted, MD  metoprolol succinate (TOPROL-XL) 100 MG 24 hr tablet Take 1 tablet (100 mg total) by mouth daily. 10/12/20 11/18/21 Yes Nahser, Wonda Cheng, MD  Multiple Vitamin (MULTIVITAMIN WITH MINERALS) TABS tablet Take 1 tablet by mouth daily. Centrum Silver   Yes [provider]  omeprazole (PRILOSEC) 40 MG capsule TAKE 1 CAPSULE BY MOUTH EVERY DAY 04/22/21  Yes Isaac Bliss, Rayford Halsted, MD  potassium chloride (KLOR-CON) 10 MEQ tablet TAKE 1 TABLET BY MOUTH EVERY DAY 09/28/21  Yes Nahser, Wonda Cheng, MD  apixaban (ELIQUIS) 5 MG TABS tablet Take 1 tablet (5 mg total) by mouth 2 (two) times daily. 10/12/20 10/12/21  Nahser, Wonda Cheng, MD  atorvastatin (LIPITOR) 40 MG tablet Take 1 tablet (40 mg total) by mouth daily. 10/12/20 10/19/21  Nahser, Wonda Cheng, MD  clindamycin (CLEOCIN T) 1 % lotion Apply 1 application topically 2 (two) times daily as needed (rosacea (skin irritation)).     [provider]  clobetasol (TEMOVATE) 0.05 % external solution Apply 1 application topically daily as needed (rosacea).    [provider]  cyclobenzaprine (FLEXERIL) 5 MG tablet Take 1 tablet (5 mg total) by mouth at bedtime as needed for muscle spasms. 07/12/21   Isaac Bliss, Rayford Halsted, MD  diltiazem (CARDIZEM CD) 120 MG 24 hr capsule Take 1 capsule (120 mg total) by mouth daily. 10/12/20 10/19/21  Nahser, Wonda Cheng, MD  fluocinonide (LIDEX) 0.05 % external solution Apply 1 application topically daily as needed (rosacea).    [provider]  metroNIDAZOLE (METROCREAM) 0.75 % cream Apply 1 application topically 2 (two) times daily as needed (rosacea).     [provider]    Current Outpatient Medications  Medication Sig Dispense Refill   ALPRAZolam (XANAX) 1 MG tablet TAKE 1 TABLET BY MOUTH THREE  TIMES A DAY AS NEEDED FOR ANXIETY 90 tablet 2   buPROPion (WELLBUTRIN XL) 150 MG 24 hr tablet TAKE 1 TABLET BY MOUTH EVERY DAY 90 tablet 1   Calcium Carbonate (CALCIUM 600 PO) Take 1 tablet by mouth daily.     FLUoxetine (PROZAC) 40 MG capsule TAKE 1 CAPSULE BY MOUTH EVERY DAY 90 capsule 1   furosemide (LASIX) 40 MG tablet Take 1 tablet (40 mg total) by mouth daily. Please keep upcoming appointment in June 2023 for future refills. Thank you 90 tablet 1   HYDROcodone-acetaminophen (NORCO) 5-325 MG tablet Take 1 tablet by mouth every 6 (six) hours as  needed for moderate pain. 30 tablet 0   metoprolol succinate (TOPROL-XL) 100 MG 24 hr tablet Take 1 tablet (100 mg total) by mouth daily. 90 tablet 3   Multiple Vitamin (MULTIVITAMIN WITH MINERALS) TABS tablet Take 1 tablet by mouth daily. Centrum Silver     omeprazole (PRILOSEC) 40 MG capsule TAKE 1 CAPSULE BY MOUTH EVERY DAY 90 capsule 1   potassium chloride (KLOR-CON) 10 MEQ tablet TAKE 1 TABLET BY MOUTH EVERY DAY 90 tablet 0   apixaban (ELIQUIS) 5 MG TABS tablet Take 1 tablet (5 mg total) by mouth 2 (two) times daily. 180 tablet 3   atorvastatin (LIPITOR) 40 MG tablet Take 1 tablet (40 mg total) by mouth daily. 90 tablet 3   clindamycin (CLEOCIN T) 1 % lotion Apply 1 application topically 2 (two) times daily as needed (rosacea (skin irritation)).      clobetasol (TEMOVATE) 0.05 % external solution Apply 1 application topically daily as needed (rosacea).     cyclobenzaprine (FLEXERIL) 5 MG tablet Take 1 tablet (5 mg total) by mouth at bedtime as needed for muscle spasms. 90 tablet 1   diltiazem (CARDIZEM CD) 120 MG 24 hr capsule Take 1 capsule (120 mg total) by mouth daily. 90 capsule 3   fluocinonide (LIDEX) 0.05 % external solution Apply 1 application topically daily as needed (rosacea).     metroNIDAZOLE (METROCREAM) 0.75 % cream Apply 1 application topically 2 (two) times daily as needed (rosacea).      Current Facility-Administered Medications   Medication Dose Route Frequency Provider Last Rate Last Admin   0.9 %  sodium chloride infusion  500 mL Intravenous Once Gatha Mayer, MD        Allergies as of 10/20/2021 - Review Complete 10/20/2021  Allergen Reaction Noted   Achromycin [tetracycline hcl] Shortness Of Breath 01/23/2011   Penicillins     Sulfamethoxazole-trimethoprim Hives    Nickel Itching 04/13/2014   Zanaflex [tizanidine hydrochloride]  01/23/2011    Family History  Problem Relation Age of Onset   Heart attack Mother 74   COPD Mother    Breast cancer Mother 2   Hypertension Father    Diabetes Father    Heart failure Father 68   Breast cancer Paternal Aunt        cns mets   COPD Maternal Grandmother    Heart disease Paternal Grandmother        died 73   Diabetes Paternal Grandmother    Stroke Paternal Grandfather        in late 41s   Heart attack Brother 36   Colon cancer Neg Hx     Social History   Socioeconomic History   Marital status: Widowed    Spouse name: Not on file   Number of children: 2   Years of education: Not on file   Highest education level: Not on file  Occupational History   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  Physical Exam: Vital signs BP 129/71    Pulse 97    Temp (!) 96.8 F (36 C) (Temporal)  Ht 5' 5.75" (1.67 m)    Wt 282 lb (127.9 kg)    SpO2 95%    BMI 45.86 kg/m   General:   Alert,  Well-developed, well-nourished, pleasant and cooperative in NAD Lungs:  Clear throughout to auscultation.   Heart:  Regular rate and rhythm; no murmurs, clicks, rubs,  or gallops. Abdomen:  Soft, nontender and nondistended.  Normal bowel sounds.   Neuro/Psych:  Alert and cooperative. Normal mood and affect. A and O x 3   @[place]  Simonne Maffucci, MD, [facility] [phone] (pager) 10/20/2021 1:22 PM@

## 2021-10-20 NOTE — Patient Instructions (Addendum)
I found and removed 6 small polyps I will let you know pathology results and when to have another routine colonoscopy by mail and/or My Chart.  You still have diverticulosis - thickened muscle rings and pouches in the colon wall. Please read the handout about this condition.  I appreciate the opportunity to care for you. Gatha Mayer, MD, [facility]   Resume Eliquis tomorrow  Please read over handouts about polyps, diverticulosis, hemorrhoids   YOU HAD AN ENDOSCOPIC PROCEDURE TODAY AT [place]:   Refer to the procedure report that was given to you for any specific questions about what was found during the examination.  If the procedure report does not answer your questions, please call your gastroenterologist to clarify.  If you requested that your care partner not be given the details of your procedure findings, then the procedure report has been included in a sealed envelope for you to review at your convenience later.  YOU SHOULD EXPECT: Some feelings of bloating in the abdomen. Passage of more gas than usual.  Walking can help get rid of the air that was put into your GI tract during the procedure and reduce the bloating. If you had a lower endoscopy (such as a colonoscopy or flexible sigmoidoscopy) you may notice spotting of blood in your stool or on the toilet paper. If you underwent a bowel prep for your procedure, you may not have a normal bowel movement for a few days.  Please Note:  You might notice some irritation and congestion in your nose or some drainage.  This is from the oxygen used during your procedure.  There is no need for concern and it should clear up in a day or so.  SYMPTOMS TO REPORT IMMEDIATELY:  Following lower endoscopy (colonoscopy or flexible sigmoidoscopy):  Excessive amounts of blood in the stool  Significant tenderness or worsening of abdominal pains  Swelling of the abdomen that is new, acute  Fever of 100F or higher  For urgent or  emergent issues, a gastroenterologist can be reached at any hour by calling [phone]. Do not use MyChart messaging for urgent concerns.    DIET:  We do recommend a small meal at first, but then you may proceed to your regular diet.  Drink plenty of fluids but you should avoid alcoholic beverages for 24 hours.  ACTIVITY:  You should plan to take it easy for the rest of today and you should NOT DRIVE or use heavy machinery until tomorrow (because of the sedation medicines used during the test).    FOLLOW UP: Our staff will call the number listed on your records 48-72 hours following your procedure to check on you and address any questions or concerns that you may have regarding the information given to you following your procedure. If we do not reach you, we will leave a message.  We will attempt to reach you two times.  During this call, we will ask if you have developed any symptoms of COVID 19. If you develop any symptoms (ie: fever, flu-like symptoms, shortness of breath, cough etc.) before then, please call [phone].  If you test positive for Covid 19 in the 2 weeks post procedure, please call and report this information to [name].    If any biopsies were taken you will be contacted by phone or by letter within the next 1-3 weeks.  Please call us at [phone] if you have not heard about the biopsies in 3 weeks.  SIGNATURES/CONFIDENTIALITY: You and/or your care partner have signed paperwork which will be entered into your electronic medical record.  These signatures attest to the fact that that the information above on your After Visit Summary has been reviewed and is understood.  Full responsibility of the confidentiality of this discharge information lies with you and/or your care-partner.

## 2021-10-22 ENCOUNTER — Other Ambulatory Visit: Payer: Self-pay | Admitting: Internal Medicine

## 2021-10-22 DIAGNOSIS — K219 Gastro-esophageal reflux disease without esophagitis: Secondary | ICD-10-CM

## 2021-10-22 DIAGNOSIS — F419 Anxiety disorder, unspecified: Secondary | ICD-10-CM

## 2021-10-24 ENCOUNTER — Telehealth: Payer: Self-pay

## 2021-10-24 NOTE — Telephone Encounter (Signed)
°  Follow up Call-  Call back number 10/20/2021  Post procedure Call Back phone  # [phone]  Permission to leave phone message Yes  Some recent data might be hidden     Patient questions:  Do you have a fever, pain , or abdominal swelling? No. Pain Score  0 *  Have you tolerated food without any problems? Yes.    Have you been able to return to your normal activities? Yes.    Do you have any questions about your discharge instructions: Diet   No. Medications  No. Follow up visit  No.  Do you have questions or concerns about your Care? No.  Actions: * If pain score is 4 or above: No action needed, pain <4.  Have you developed a fever since your procedure? no  2.   Have you had an respiratory symptoms (SOB or cough) since your procedure? no  3.   Have you tested positive for COVID 19 since your procedure no  4.   Have you had any family members/close contacts diagnosed with the COVID 19 since your procedure?  no   If yes to any of these questions please route to Joylene John, RN and Joella Prince, RN

## 2021-10-30 ENCOUNTER — Other Ambulatory Visit: Payer: Self-pay | Admitting: Cardiovascular Disease

## 2021-10-30 ENCOUNTER — Encounter: Payer: Self-pay | Admitting: Internal Medicine

## 2021-10-30 DIAGNOSIS — I480 Paroxysmal atrial fibrillation: Secondary | ICD-10-CM

## 2021-10-31 ENCOUNTER — Other Ambulatory Visit: Payer: Self-pay | Admitting: Cardiovascular Disease

## 2021-10-31 DIAGNOSIS — I480 Paroxysmal atrial fibrillation: Secondary | ICD-10-CM

## 2021-10-31 DIAGNOSIS — I10 Essential (primary) hypertension: Secondary | ICD-10-CM

## 2021-10-31 NOTE — Telephone Encounter (Signed)
Prescription refill request for Eliquis received. Indication: Afib  Last office visit: 10/06/21 Kathy [name])  Scr: 1.03 (10/12/21)  Age: 71 Weight: 127.9kg  Appropriate dose and refill sent to requested pharmacy.

## 2021-11-21 ENCOUNTER — Telehealth: Payer: Self-pay | Admitting: Internal Medicine

## 2021-11-21 DIAGNOSIS — M797 Fibromyalgia: Secondary | ICD-10-CM

## 2021-11-21 MED ORDER — HYDROCODONE-ACETAMINOPHEN 5-325 MG PO TABS: 1.0000 | ORAL_TABLET | Freq: Four times a day (QID) | ORAL | 0 refills | Status: DC | PRN

## 2021-11-21 NOTE — Telephone Encounter (Signed)
Rx sent to [facility] in [place] ?

## 2021-11-21 NOTE — Telephone Encounter (Signed)
Pt call and stated [facility] in [place] have the HYDROcodone-acetaminophen (NORCO) 5-325 MG tablet . [facility] # is [phone] ?

## 2021-11-21 NOTE — Telephone Encounter (Signed)
Per Dr Jerilee Hoh, patient should call pharmacies to see which one carries the prescription.   ?Patient is aware. ?

## 2021-11-21 NOTE — Telephone Encounter (Signed)
Patient called because she is needing someone to contact the pharmacy to let them know if it is okay for them to give patient HYDROcodone-acetaminophen (NORCO) 5-325 MG tablet in '525mg'$  instead of the '325mg'$  as they do not have it in stock. Patient only has one pill left and is going out of town tomorrow morning. [place] the beach, and the change in sea level with cause her some pain. She has been waiting for the pharmacy to receive her prescription in the correct dose but they do not know when it will be in. Pharmacy stated they sent a fax over with this information but wanted patient to call as well. ? ? ? ?Please send to ? ?[id number] [place], [place] - [address] Phone:  [phone] ?Fax:  [phone] ?  ? ? ? ? ?Please advise  ?

## 2021-11-30 NOTE — Telephone Encounter (Signed)
Disregard

## 2021-12-04 ENCOUNTER — Other Ambulatory Visit: Payer: Self-pay | Admitting: Cardiovascular Disease

## 2021-12-04 DIAGNOSIS — E785 Hyperlipidemia, unspecified: Secondary | ICD-10-CM

## 2021-12-19 ENCOUNTER — Telehealth (INDEPENDENT_AMBULATORY_CARE_PROVIDER_SITE_OTHER): Payer: Medicare Other | Admitting: Internal Medicine

## 2021-12-19 ENCOUNTER — Telehealth: Payer: Self-pay | Admitting: Internal Medicine

## 2021-12-19 ENCOUNTER — Encounter: Payer: Self-pay | Admitting: Internal Medicine

## 2021-12-19 VITALS — Wt 284.0 lb

## 2021-12-19 DIAGNOSIS — M797 Fibromyalgia: Secondary | ICD-10-CM | POA: Diagnosis not present

## 2021-12-19 DIAGNOSIS — G8929 Other chronic pain: Secondary | ICD-10-CM | POA: Diagnosis not present

## 2021-12-19 DIAGNOSIS — M545 Low back pain, unspecified: Secondary | ICD-10-CM | POA: Diagnosis not present

## 2021-12-19 MED ORDER — HYDROCODONE-ACETAMINOPHEN 5-325 MG PO TABS: 1.0000 | ORAL_TABLET | Freq: Four times a day (QID) | ORAL | 0 refills | Status: DC | PRN

## 2021-12-19 NOTE — Telephone Encounter (Signed)
Virtual visit scheduled for 12/19/21 ?

## 2021-12-19 NOTE — Progress Notes (Signed)
? ? ?Virtual Visit via Telephone Note ? ?I connected with Kathy [name] on 12/19/21 at 11:30 AM EDT by telephone and verified that I am speaking with the correct person using two identifiers. ?  ?I discussed the limitations, risks, security and privacy concerns of performing an evaluation and management service by telephone and the availability of in person appointments. I also discussed with the patient that there may be a patient responsible charge related to this service. The patient expressed understanding and agreed to proceed. ? ?Location patient: home ?Location provider: work office ?Participants present for the call: patient, provider ?Patient did not have a visit in the prior 7 days to address this/these issue(s). ? ? ?History of Present Illness: ? ?She has scheduled this visit for the purpose of medication refills per protocol.  She is prescribed hydrocodone 5/325 mg to take 1 tablet daily for total of 30 tablets a month.  She takes this for her history of fibromyalgia and lower back pain after a fall that caused several pelvic and lower vertebral fractures.  She is feeling well and has no acute concerns.  She is still depressed after her husband unfortunately passed away last year. ?  ?Observations/Objective: ?Patient sounds cheerful and well on the phone. ?I do not appreciate any increased work of breathing. ?Speech and thought processing are grossly intact. ?Patient reported vitals: None reported ? ? ?Current Outpatient Medications:  ?  ALPRAZolam (XANAX) 1 MG tablet, TAKE 1 TABLET BY MOUTH THREE TIMES A DAY AS NEEDED FOR ANXIETY, Disp: 90 tablet, Rfl: 2 ?  atorvastatin (LIPITOR) 40 MG tablet, Take 1 tablet (40 mg total) by mouth daily., Disp: 90 tablet, Rfl: 3 ?  buPROPion (WELLBUTRIN XL) 150 MG 24 hr tablet, TAKE 1 TABLET BY MOUTH EVERY DAY, Disp: 90 tablet, Rfl: 1 ?  Calcium Carbonate (CALCIUM 600 PO), Take 1 tablet by mouth daily., Disp: , Rfl:  ?  clindamycin (CLEOCIN T) 1 % lotion, Apply 1  application topically 2 (two) times daily as needed (rosacea (skin irritation)). , Disp: , Rfl:  ?  clobetasol (TEMOVATE) 0.05 % external solution, Apply 1 application topically daily as needed (rosacea)., Disp: , Rfl:  ?  cyclobenzaprine (FLEXERIL) 5 MG tablet, Take 1 tablet (5 mg total) by mouth at bedtime as needed for muscle spasms., Disp: 90 tablet, Rfl: 1 ?  diltiazem (CARDIZEM CD) 120 MG 24 hr capsule, Take 1 capsule (120 mg total) by mouth daily., Disp: 90 capsule, Rfl: 3 ?  ELIQUIS 5 MG TABS tablet, TAKE 1 TABLET BY MOUTH TWICE A DAY, Disp: 180 tablet, Rfl: 3 ?  fluocinonide (LIDEX) 0.05 % external solution, Apply 1 application topically daily as needed (rosacea)., Disp: , Rfl:  ?  FLUoxetine (PROZAC) 40 MG capsule, TAKE 1 CAPSULE BY MOUTH EVERY DAY, Disp: 90 capsule, Rfl: 1 ?  furosemide (LASIX) 40 MG tablet, Take 1 tablet (40 mg total) by mouth daily. Please keep upcoming appointment in June 2023 for future refills. Thank you, Disp: 90 tablet, Rfl: 1 ?  HYDROcodone-acetaminophen (NORCO/VICODIN) 5-325 MG tablet, Take 1 tablet by mouth every 6 (six) hours as needed for moderate pain., Disp: 30 tablet, Rfl: 0 ?  HYDROcodone-acetaminophen (NORCO/VICODIN) 5-325 MG tablet, Take 1 tablet by mouth every 6 (six) hours as needed for moderate pain., Disp: 30 tablet, Rfl: 0 ?  metoprolol succinate (TOPROL-XL) 100 MG 24 hr tablet, TAKE 1 TABLET BY MOUTH EVERY DAY, Disp: 90 tablet, Rfl: 1 ?  metroNIDAZOLE (METROCREAM) 0.75 % cream, Apply 1  application topically 2 (two) times daily as needed (rosacea). , Disp: , Rfl:  ?  Multiple Vitamin (MULTIVITAMIN WITH MINERALS) TABS tablet, Take 1 tablet by mouth daily. Centrum Silver, Disp: , Rfl:  ?  omeprazole (PRILOSEC) 40 MG capsule, TAKE 1 CAPSULE BY MOUTH EVERY DAY, Disp: 90 capsule, Rfl: 1 ?  potassium chloride (KLOR-CON) 10 MEQ tablet, Take 1 tablet (10 mEq total) by mouth daily., Disp: 90 tablet, Rfl: 3 ?  HYDROcodone-acetaminophen (NORCO) 5-325 MG tablet, Take 1 tablet  by mouth every 6 (six) hours as needed for moderate pain., Disp: 30 tablet, Rfl: 0 ? ?Review of Systems: ? ?Constitutional: Denies fever, chills, diaphoresis, appetite change and fatigue.  ?HEENT: Denies photophobia, eye pain, redness, hearing loss, ear pain, congestion, sore throat, rhinorrhea, sneezing, mouth sores, trouble swallowing, neck pain, neck stiffness and tinnitus.   ?Respiratory: Denies SOB, DOE, cough, chest tightness,  and wheezing.   ?Cardiovascular: Denies chest pain, palpitations and leg swelling.  ?Gastrointestinal: Denies nausea, vomiting, abdominal pain, diarrhea, constipation, blood in stool and abdominal distention.  ?Genitourinary: Denies dysuria, urgency, frequency, hematuria, flank pain and difficulty urinating.  ?Endocrine: Denies: hot or cold intolerance, sweats, changes in hair or nails, polyuria, polydipsia. ?Musculoskeletal: Positive for myalgias, back pain, joint swelling, arthralgias and gait problem.  ?Skin: Denies pallor, rash and wound.  ?Neurological: Denies dizziness, seizures, syncope, weakness, light-headedness, numbness and headaches.  ?Hematological: Denies adenopathy. Easy bruising, personal or family bleeding history  ?Psychiatric/Behavioral: Denies suicidal ideation, mood changes, confusion, nervousness, sleep disturbance and agitation ? ? ?Assessment and Plan: ? ?Chronic bilateral low back pain without sciatica - Plan: HYDROcodone-acetaminophen (NORCO) 5-325 MG tablet, HYDROcodone-acetaminophen (NORCO/VICODIN) 5-325 MG tablet, HYDROcodone-acetaminophen (NORCO/VICODIN) 5-325 MG tablet ? ?Fibromyalgia - Plan: HYDROcodone-acetaminophen (NORCO) 5-325 MG tablet, HYDROcodone-acetaminophen (NORCO/VICODIN) 5-325 MG tablet, HYDROcodone-acetaminophen (NORCO/VICODIN) 5-325 MG tablet ? ?-PDMP has been reviewed.  She has a high overdose risk score of 610 and a red flag for greater then 5 prescribing providers in the last 2 years.  Since I agreed to take over her hydrocodone  prescription I have been the only prescriber for the last 9 months or so.  I think it is reasonable to continue her prescription as long as she remains carefully monitored for potential side effects, tolerance or habit-forming. ? ? ? ?I discussed the assessment and treatment plan with the patient. The patient was provided an opportunity to ask questions and all were answered. The patient agreed with the plan and demonstrated an understanding of the instructions. ?  ?The patient was advised to call back or seek an in-person evaluation if the symptoms worsen or if the condition fails to improve as anticipated. ? ?I provided 13 minutes of non-face-to-face time during this encounter. ? ? ?Lelon Frohlich, MD ?[facility] ? ?

## 2021-12-19 NOTE — Telephone Encounter (Signed)
Pt is calling and needs a refill on HYDROcodone-acetaminophen (NORCO) 5-325 MG tablet  ?[place], [place] - [name] Phone:  [phone]  ?Fax:  [phone]  ?  ? ?

## 2021-12-20 ENCOUNTER — Other Ambulatory Visit: Payer: Self-pay | Admitting: Internal Medicine

## 2021-12-20 DIAGNOSIS — F339 Major depressive disorder, recurrent, unspecified: Secondary | ICD-10-CM

## 2022-01-03 ENCOUNTER — Other Ambulatory Visit: Payer: Self-pay | Admitting: Internal Medicine

## 2022-01-03 DIAGNOSIS — F419 Anxiety disorder, unspecified: Secondary | ICD-10-CM

## 2022-01-20 ENCOUNTER — Telehealth: Payer: Self-pay

## 2022-01-20 NOTE — Telephone Encounter (Signed)
Last refill per controlled substance database: 12/19/21 ? ?Upon review of chart pt noted to have 2 more prescriptions left written on 12/19/21. Pt notified that [facility] [place] to let them know that she would like to fill the prescription. Pt verb understanding. ? ?

## 2022-01-20 NOTE — Telephone Encounter (Signed)
Patient called in for refill on HYDROcodone-acetaminophen ([place]) 5-325 MG tablet she was told by pharmacy to call the office.  ?

## 2022-02-08 ENCOUNTER — Other Ambulatory Visit: Payer: Self-pay | Admitting: Internal Medicine

## 2022-02-08 DIAGNOSIS — Z1231 Encounter for screening mammogram for malignant neoplasm of breast: Secondary | ICD-10-CM

## 2022-02-13 ENCOUNTER — Other Ambulatory Visit: Payer: Self-pay | Admitting: Cardiovascular Disease

## 2022-02-13 ENCOUNTER — Other Ambulatory Visit: Payer: Self-pay | Admitting: Internal Medicine

## 2022-02-13 DIAGNOSIS — K219 Gastro-esophageal reflux disease without esophagitis: Secondary | ICD-10-CM

## 2022-02-13 DIAGNOSIS — I10 Essential (primary) hypertension: Secondary | ICD-10-CM

## 2022-02-13 DIAGNOSIS — F419 Anxiety disorder, unspecified: Secondary | ICD-10-CM

## 2022-02-13 DIAGNOSIS — I480 Paroxysmal atrial fibrillation: Secondary | ICD-10-CM

## 2022-02-17 ENCOUNTER — Telehealth: Payer: Self-pay | Admitting: Internal Medicine

## 2022-02-17 NOTE — Telephone Encounter (Signed)
Spoke to [employer]). She confirm patient has 1 refill left to pick up tomorrow. Patient was made aware

## 2022-02-17 NOTE — Telephone Encounter (Signed)
Pt called in for Rx refill on HYDROcodone-acetaminophen (NORCO) 5-325 MG tablet   Please advise.

## 2022-03-06 ENCOUNTER — Ambulatory Visit: Payer: Medicare Other

## 2022-03-17 ENCOUNTER — Ambulatory Visit: Payer: Medicare Other | Admitting: Cardiovascular Disease

## 2022-03-20 ENCOUNTER — Other Ambulatory Visit: Payer: Self-pay | Admitting: Internal Medicine

## 2022-03-20 DIAGNOSIS — G8929 Other chronic pain: Secondary | ICD-10-CM

## 2022-03-20 DIAGNOSIS — M797 Fibromyalgia: Secondary | ICD-10-CM

## 2022-03-20 MED ORDER — HYDROCODONE-ACETAMINOPHEN 5-325 MG PO TABS: 1.0000 | ORAL_TABLET | Freq: Four times a day (QID) | ORAL | 0 refills | Status: DC | PRN

## 2022-03-20 NOTE — Telephone Encounter (Signed)
Pt requesting refill of HYDROcodone-acetaminophen (NORCO) 5-325 MG tablet  pt on vacation, needs prescription sent to  [facility] in [place] [phone]

## 2022-03-20 NOTE — Telephone Encounter (Signed)
Last VV 12/19/21 Okay to fill?

## 2022-03-20 NOTE — Telephone Encounter (Signed)
Refill sent.

## 2022-03-24 ENCOUNTER — Other Ambulatory Visit: Payer: Self-pay | Admitting: Internal Medicine

## 2022-03-24 DIAGNOSIS — F419 Anxiety disorder, unspecified: Secondary | ICD-10-CM

## 2022-03-24 DIAGNOSIS — M797 Fibromyalgia: Secondary | ICD-10-CM

## 2022-03-24 NOTE — Telephone Encounter (Signed)
Pt is out of town and would like a 30 day supply ALPRAZolam [name]) 1 MG tablet  [id number] [address] AT [place]:  [phone] Fax:  [phone]   Pt is aware md out of office until 03-27-2022

## 2022-03-27 MED ORDER — ALPRAZOLAM 1 MG PO TABS: ORAL_TABLET | ORAL | 0 refills | Status: DC

## 2022-03-27 NOTE — Telephone Encounter (Signed)
Refill sent.

## 2022-03-28 ENCOUNTER — Other Ambulatory Visit: Payer: Self-pay | Admitting: Cardiovascular Disease

## 2022-04-11 ENCOUNTER — Ambulatory Visit: Payer: Medicare Other | Admitting: Internal Medicine

## 2022-04-12 MED ORDER — CYCLOBENZAPRINE HCL 5 MG PO TABS: 5.0000 mg | ORAL_TABLET | Freq: Every evening | ORAL | 1 refills | Status: DC | PRN

## 2022-04-12 NOTE — Telephone Encounter (Signed)
Prescription not sent to the requested pharmacy. Patient [facility]  [id number] [place] [address]:  [phone] Fax:  [phone]

## 2022-04-12 NOTE — Telephone Encounter (Signed)
Pt requesting refill of cyclobenzaprine (FLEXERIL) 5 MG tablet [facility]  [id number] [place] [place]:  [phone] Fax:  [phone]

## 2022-04-12 NOTE — Addendum Note (Signed)
Addended by: Westley Hummer B on: 04/12/2022 12:06 PM   Modules accepted: Orders

## 2022-04-12 NOTE — Telephone Encounter (Signed)
Refills sent

## 2022-04-18 ENCOUNTER — Telehealth: Payer: Self-pay | Admitting: Internal Medicine

## 2022-04-18 DIAGNOSIS — M797 Fibromyalgia: Secondary | ICD-10-CM

## 2022-04-18 DIAGNOSIS — F419 Anxiety disorder, unspecified: Secondary | ICD-10-CM

## 2022-04-18 DIAGNOSIS — G8929 Other chronic pain: Secondary | ICD-10-CM

## 2022-04-18 MED ORDER — ALPRAZOLAM 1 MG PO TABS: ORAL_TABLET | ORAL | 0 refills | Status: DC

## 2022-04-18 MED ORDER — HYDROCODONE-ACETAMINOPHEN 5-325 MG PO TABS: 1.0000 | ORAL_TABLET | Freq: Four times a day (QID) | ORAL | 0 refills | Status: DC | PRN

## 2022-04-18 NOTE — Addendum Note (Signed)
Addended by: Lelon Frohlich Y on: 04/18/2022 11:51 AM   Modules accepted: Orders

## 2022-04-18 NOTE — Telephone Encounter (Signed)
Last OV: video visit 12/19/21; in person OV 10/12/21 for CPE Next OV: 05/09/22

## 2022-04-18 NOTE — Telephone Encounter (Signed)
Pt call and stated she need her HYDROcodone-acetaminophen ([place]) 5-325 MG tablet sent to  [facility], [place] - [place] [address] AT [place] Phone:  [phone]  Fax:  [phone]    And her ALPRAZolam [name]) 1 MG tablet [facility]  [id number] [place] AT [place]:  [phone] Fax:  [phone]

## 2022-04-19 MED ORDER — HYDROCODONE-ACETAMINOPHEN 5-325 MG PO TABS: 1.0000 | ORAL_TABLET | Freq: Four times a day (QID) | ORAL | 0 refills | Status: DC | PRN

## 2022-04-19 MED ORDER — ALPRAZOLAM 1 MG PO TABS: ORAL_TABLET | ORAL | 0 refills | Status: DC

## 2022-04-19 NOTE — Addendum Note (Signed)
Addended by: Lelon Frohlich Y on: 04/19/2022 04:06 PM   Modules accepted: Orders

## 2022-04-19 NOTE — Telephone Encounter (Addendum)
Pt requested these prescriptions be [facility]  [id number] [place], [place]:  [phone] Fax:  [phone]     She is out now and in extreme pain and asks these go to the correct pharmacy.

## 2022-05-02 ENCOUNTER — Other Ambulatory Visit: Payer: Self-pay | Admitting: Neurosurgery

## 2022-05-02 DIAGNOSIS — M5412 Radiculopathy, cervical region: Secondary | ICD-10-CM

## 2022-05-04 ENCOUNTER — Other Ambulatory Visit: Payer: Self-pay | Admitting: Cardiovascular Disease

## 2022-05-04 DIAGNOSIS — I480 Paroxysmal atrial fibrillation: Secondary | ICD-10-CM

## 2022-05-04 DIAGNOSIS — I10 Essential (primary) hypertension: Secondary | ICD-10-CM

## 2022-05-05 NOTE — Telephone Encounter (Signed)
error 

## 2022-05-09 ENCOUNTER — Ambulatory Visit: Payer: Medicare Other | Admitting: Internal Medicine

## 2022-05-09 ENCOUNTER — Encounter: Payer: Self-pay | Admitting: Internal Medicine

## 2022-05-09 VITALS — BP 124/84 | HR 70 | Temp 98.0°F | Wt 295.9 lb

## 2022-05-09 DIAGNOSIS — G8929 Other chronic pain: Secondary | ICD-10-CM

## 2022-05-09 DIAGNOSIS — Z9989 Dependence on other enabling machines and devices: Secondary | ICD-10-CM

## 2022-05-09 DIAGNOSIS — G4733 Obstructive sleep apnea (adult) (pediatric): Secondary | ICD-10-CM

## 2022-05-09 DIAGNOSIS — I10 Essential (primary) hypertension: Secondary | ICD-10-CM

## 2022-05-09 DIAGNOSIS — I4819 Other persistent atrial fibrillation: Secondary | ICD-10-CM

## 2022-05-09 DIAGNOSIS — I5032 Chronic diastolic (congestive) heart failure: Secondary | ICD-10-CM

## 2022-05-09 DIAGNOSIS — I480 Paroxysmal atrial fibrillation: Secondary | ICD-10-CM

## 2022-05-09 DIAGNOSIS — N1831 Chronic kidney disease, stage 3a: Secondary | ICD-10-CM | POA: Diagnosis not present

## 2022-05-09 DIAGNOSIS — M797 Fibromyalgia: Secondary | ICD-10-CM

## 2022-05-09 DIAGNOSIS — M545 Low back pain, unspecified: Secondary | ICD-10-CM

## 2022-05-09 DIAGNOSIS — F339 Major depressive disorder, recurrent, unspecified: Secondary | ICD-10-CM

## 2022-05-09 DIAGNOSIS — Z7901 Long term (current) use of anticoagulants: Secondary | ICD-10-CM

## 2022-05-09 MED ORDER — APIXABAN 5 MG PO TABS: 5.0000 mg | ORAL_TABLET | Freq: Two times a day (BID) | ORAL | 0 refills | Status: DC

## 2022-05-09 NOTE — Progress Notes (Signed)
Established Patient Office Visit     CC/Reason for Visit: 73-monthfollow-up chronic conditions  HPI: Kathy VESEYis a 71y.o. female who is coming in today for the above mentioned reasons. Past Medical History is significant for: Depression, A. fib on Eliquis, fibromyalgia and a multitude of other arthritic conditions.  She is undergoing evaluation of right shoulder pain by orthopedics.  There is an MRI scheduled soon.  She was provided 40 tablets of hydrocodone by them for this which she is notifying me of today as she has a chronic narcotic prescription with me.  We will skip 1 months worth of medication.  Next refill should be due 9/11.  She is otherwise doing well.  She is not requesting narcotic refills today.   Past Medical/Surgical History: Past Medical History:  Diagnosis Date   Adenomatous colon polyp    2012   Anxiety    Anxiety and depression    Arthritis    Osteoarthritis, Dr AMaureen Ralphs  Cancer ([facility]    skin - left foot excised   Chronic diastolic CHF (congestive heart failure) (HCC)    DDD (degenerative disc disease), cervical    Deaf, right    Depression    Diverticulosis    [place]    RLE DVT 08/2004, post right knee arthroscopy   Dyspnea    occasional- no oxygen   Dysrhythmia    a-fib   Fibromyalgia    Dr STobie Lords Rheu   GERD (gastroesophageal reflux disease)    H/O hiatal hernia    Headache(784.0)    PAST HX MILD MIGRAINES - resolved per patient 08/18/20   Heart murmur    mitral valve prolapse--no symptoms   History of colonic polyps 08/08/2011        History of kidney stones    passed stones   HTN (hypertension)    IBS (irritable bowel syndrome)    Internal hemorrhoid    Lumbar radiculopathy    Obesity    OSA on CPAP    Moderate with AHI 20/hr with nocturnal hypoxemia on CPAP   PAF (paroxysmal atrial fibrillation) ([place] 06/2018   Pulmonary hypertension ([place]    Syncope    idiopathic, ? related to  migraines or hyperventilation. Dr SLeonie Man Neurology, YEARS AGO; 02/03/20 in setting of afib with RVR    Past Surgical History:  Procedure Laterality Date   ABDOMINAL HYSTERECTOMY     dysfunctional menses   APPENDECTOMY     BREAST EXCISIONAL BIOPSY Right    CARDIAC CATHETERIZATION  06/26/2018   CHOLECYSTECTOMY     COLONOSCOPY  05/27/2002, 08/08/11   2003 diverticulosis, hemorrhoids 2012 same + cecal polyp   KNEE ARTHROSCOPY  2008   right   KYPHOPLASTY Bilateral 08/19/2020   Procedure: KYPHOPLASTY LUMBAR ONE;  Surgeon: TVallarie Mare MD;  [place]  Service: Neurosurgery;  Laterality: Bilateral;  posterior   LEFT HEART CATH AND CORONARY ANGIOGRAPHY N/A 06/26/2018   Procedure: LEFT HEART CATH AND CORONARY ANGIOGRAPHY;  Surgeon: AWellington Hampshire MD;  [facility];  Service: Cardiovascular;  Laterality: N/A;   MELANOMA EXCISION Left 07/2013   foot   TOTAL HIP ARTHROPLASTY  02/2010   left ; Dr AMaureen Ralphs  TOTAL HIP ARTHROPLASTY  03/18/2012   Procedure: TOTAL HIP ARTHROPLASTY;  Surgeon: FGearlean Alf MD;  Location: [facility];  Service: Orthopedics;  Laterality: Right;   UPPER GASTROINTESTINAL ENDOSCOPY  04/21/2009, 08/08/11   2010 gastritis ;  2012 small hiatal hernia. Dr Carlean Purl   WRIST FRACTURE SURGERY Left     Social History:  reports that she has never smoked. She has never used smokeless tobacco. She reports current alcohol use. She reports that she does not use drugs.  Allergies: Allergies  Allergen Reactions   Achromycin [Tetracycline Hcl] Shortness Of Breath    Dyspnea     Penicillins     Facial angioedema Did it involve swelling of the face/tongue/throat, SOB, or low BP? Yes Did it involve sudden or severe rash/hives, skin peeling, or any reaction on the inside of your mouth or nose? No Did you need to seek medical attention at a hospital or doctor's office? No When did it last happen?      30+ years If all above answers are "NO", may proceed with  cephalosporin use.    Sulfamethoxazole-Trimethoprim Hives   Nickel Itching   Zanaflex [Tizanidine Hydrochloride]     Causes more pain     Family History:  Family History  Problem Relation Age of Onset   Heart attack Mother 108   COPD Mother    Breast cancer Mother 15   Hypertension Father    Diabetes Father    Heart failure Father 48   Breast cancer Paternal Aunt        cns mets   COPD Maternal Grandmother    Heart disease Paternal Grandmother        died 54   Diabetes Paternal Grandmother    Stroke Paternal Grandfather        in late 7s   Heart attack Brother 65   Colon cancer Neg Hx      Current Outpatient Medications:    ALPRAZolam (XANAX) 1 MG tablet, TAKE 1 TABLET BY MOUTH THREE TIMES A DAY AS NEEDED FOR ANXIETY, Disp: 90 tablet, Rfl: 0   atorvastatin (LIPITOR) 40 MG tablet, Take 1 tablet (40 mg total) by mouth daily., Disp: 90 tablet, Rfl: 3   Calcium Carbonate (CALCIUM 600 PO), Take 1 tablet by mouth daily., Disp: , Rfl:    clindamycin (CLEOCIN T) 1 % lotion, Apply 1 application topically 2 (two) times daily as needed (rosacea (skin irritation)). , Disp: , Rfl:    clobetasol (TEMOVATE) 0.05 % external solution, Apply 1 application topically daily as needed (rosacea)., Disp: , Rfl:    cyclobenzaprine (FLEXERIL) 5 MG tablet, Take 1 tablet (5 mg total) by mouth at bedtime as needed for muscle spasms., Disp: 90 tablet, Rfl: 1   diltiazem (CARDIZEM CD) 120 MG 24 hr capsule, Take 1 capsule (120 mg total) by mouth daily., Disp: 90 capsule, Rfl: 3   fluocinonide (LIDEX) 0.05 % external solution, Apply 1 application topically daily as needed (rosacea)., Disp: , Rfl:    FLUoxetine (PROZAC) 40 MG capsule, TAKE 1 CAPSULE BY MOUTH EVERY DAY, Disp: 90 capsule, Rfl: 0   furosemide (LASIX) 40 MG tablet, TAKE 1 TABLET BY MOUTH DAILY. PLEASE KEEP UPCOMING APPOINTMENT IN JUNE 2023 FOR FUTURE REFILLS., Disp: 90 tablet, Rfl: 1   HYDROcodone-acetaminophen (NORCO) 5-325 MG tablet, Take 1  tablet by mouth every 6 (six) hours as needed for moderate pain., Disp: 30 tablet, Rfl: 0   HYDROcodone-acetaminophen (NORCO/VICODIN) 5-325 MG tablet, Take 1 tablet by mouth every 6 (six) hours as needed for moderate pain., Disp: 30 tablet, Rfl: 0   HYDROcodone-acetaminophen (NORCO/VICODIN) 5-325 MG tablet, Take 1 tablet by mouth every 6 (six) hours as needed for moderate pain., Disp: 30 tablet, Rfl: 0   metoprolol  succinate (TOPROL-XL) 100 MG 24 hr tablet, TAKE 1 TABLET BY MOUTH EVERY DAY, Disp: 30 tablet, Rfl: 5   metroNIDAZOLE (METROCREAM) 0.75 % cream, Apply 1 application topically 2 (two) times daily as needed (rosacea). , Disp: , Rfl:    Multiple Vitamin (MULTIVITAMIN WITH MINERALS) TABS tablet, Take 1 tablet by mouth daily. Centrum Silver, Disp: , Rfl:    omeprazole (PRILOSEC) 40 MG capsule, TAKE 1 CAPSULE BY MOUTH EVERY DAY, Disp: 90 capsule, Rfl: 0   potassium chloride (KLOR-CON) 10 MEQ tablet, Take 1 tablet (10 mEq total) by mouth daily., Disp: 90 tablet, Rfl: 3   apixaban (ELIQUIS) 5 MG TABS tablet, Take 1 tablet (5 mg total) by mouth 2 (two) times daily., Disp: 56 tablet, Rfl: 0  Review of Systems:  Constitutional: Denies fever, chills, diaphoresis, appetite change and fatigue.  HEENT: Denies photophobia, eye pain, redness, hearing loss, ear pain, congestion, sore throat, rhinorrhea, sneezing, mouth sores, trouble swallowing, neck pain, neck stiffness and tinnitus.   Respiratory: Denies SOB, DOE, cough, chest tightness,  and wheezing.   Cardiovascular: Denies chest pain, palpitations and leg swelling.  Gastrointestinal: Denies nausea, vomiting, abdominal pain, diarrhea, constipation, blood in stool and abdominal distention.  Genitourinary: Denies dysuria, urgency, frequency, hematuria, flank pain and difficulty urinating.  Endocrine: Denies: hot or cold intolerance, sweats, changes in hair or nails, polyuria, polydipsia. Musculoskeletal: Positive for myalgias, back pain, joint swelling,  arthralgias and gait problem.  Skin: Denies pallor, rash and wound.  Neurological: Denies dizziness, seizures, syncope, weakness, light-headedness, numbness and headaches.  Hematological: Denies adenopathy. Easy bruising, personal or family bleeding history  Psychiatric/Behavioral: Denies suicidal ideation, mood changes, confusion, nervousness, sleep disturbance and agitation    Physical Exam: Vitals:   05/09/22 1503  BP: 124/84  Pulse: 70  Temp: 98 F (36.7 C)  TempSrc: Oral  SpO2: 98%  Weight: 295 lb 14.4 oz (134.2 kg)    Body mass index is 48.12 kg/m.   Constitutional: NAD, calm, comfortable, obese, ambulates with a cane Eyes: PERRL, lids and conjunctivae normal, wears corrective lenses ENMT: Mucous membranes are moist.  Respiratory: clear to auscultation bilaterally, no wheezing, no crackles. Normal respiratory effort. No accessory muscle use.  Cardiovascular: Regular rate and rhythm, no murmurs / rubs / gallops. No extremity edema.  Psychiatric: Normal judgment and insight. Alert and oriented x 3. Normal mood.    Impression and Plan:  Stage 3a chronic kidney disease ([place])  Paroxysmal atrial fibrillation ([place]) - Plan: apixaban (ELIQUIS) 5 MG TABS tablet  Chronic diastolic CHF (congestive heart failure) (HCC)  Persistent atrial fibrillation (HCC)  Chronic anticoagulation  OSA on CPAP  Depression, recurrent (HCC)  Morbid obesity ([place])  Essential hypertension  Fibromyalgia  Chronic low back pain without sciatica, unspecified back pain laterality  -Labs checked at last visit. -PDMP has been reviewed, she has an overdose risk score of 560 with 2 red flags 1 for greater than 5 providers and 1 for greater than 4 pharmacies.  Most recent prescriptions have been by me with the exception of 40 tablets of Norco 5/325 mg provided by Dr. Ronnald Ramp on 8/11 for the work-up of her shoulder pain.  No refills provided today, next refill should not be due until around September  11. -Eliquis samples have been provided today, she has an upcoming appointment with her cardiologist.   Time spent:31 minutes reviewing chart, interviewing and examining patient and formulating plan of care.     Lelon Frohlich, MD [facility] at [facility]

## 2022-05-11 ENCOUNTER — Ambulatory Visit
Admission: RE | Admit: 2022-05-11 | Discharge: 2022-05-11 | Disposition: A | Discharge status: Home / Self Care | Payer: Medicare Other | Source: Ambulatory Visit | Attending: Neurosurgery | Admitting: Neurosurgery

## 2022-05-11 DIAGNOSIS — M5412 Radiculopathy, cervical region: Secondary | ICD-10-CM

## 2022-05-15 ENCOUNTER — Ambulatory Visit
Admission: RE | Admit: 2022-05-15 | Discharge: 2022-05-15 | Disposition: A | Discharge status: Home / Self Care | Payer: Medicare Other | Source: Ambulatory Visit | Attending: Internal Medicine | Admitting: Internal Medicine

## 2022-05-15 DIAGNOSIS — Z1231 Encounter for screening mammogram for malignant neoplasm of breast: Secondary | ICD-10-CM

## 2022-06-05 ENCOUNTER — Encounter: Payer: Self-pay | Admitting: Cardiovascular Disease

## 2022-06-05 ENCOUNTER — Ambulatory Visit: Payer: Medicare Other | Attending: Cardiovascular Disease | Admitting: Cardiovascular Disease

## 2022-06-05 DIAGNOSIS — R42 Dizziness and giddiness: Secondary | ICD-10-CM

## 2022-06-05 DIAGNOSIS — R0789 Other chest pain: Secondary | ICD-10-CM

## 2022-06-05 DIAGNOSIS — E785 Hyperlipidemia, unspecified: Secondary | ICD-10-CM | POA: Diagnosis not present

## 2022-06-05 DIAGNOSIS — I480 Paroxysmal atrial fibrillation: Secondary | ICD-10-CM

## 2022-06-05 NOTE — Progress Notes (Signed)
Cardiology Office Note:    Date:  06/05/2022   ID:  Kathy [name], DOB 03/06/1951, MRN [id number]  PCP:  Isaac Bliss, Rayford Halsted, MD  Cardiologist:  Mertie Moores, MD  Electrophysiologist:  None   Referring MD: Isaac Bliss, [place]*   Problem List 1.  Chronic diastolic CHF 2. Morbid obesity 3. Severe pulmonary HTN 4. Mild CAD  5.   Chief Complaint  Patient presents with   Obesity   Atrial Fibrillation        Sleep Apnea   Congestive Heart Failure             Kathy [name] is a 71 y.o. female with a hx of  MVP, chest pain  Hyperlipidemia and atrial fib We are asked to see her by Isaac Bliss, Rayford Halsted, MD for further eval of her atrial fib  I have seen her many years ago .   Developed CP,  Called EMS ECG at home showed atrial fib  She was admitted to the hospital with mid sternal chest pain , atrial fib, HTN.  Troponin levels rose to 4.96.    cath showed mild irreg. CT angiogram of the chest showed no aortic dissection  Echo shows  Normal LV systolic function - severe concnetric LCH Grade 2 diastolic dysfunction   Severe pulmonary HTN with estimated PA pressure of 84.   Gained 16 lbs after her hospitalization Was started on Lasix by Dr. Sharlene Motts  BP is now under control.  Breathing seems to be better Does not do any exercise ( limited by orthopedic issues )    Feb. 25, 2020: Kathy [name] is seen back today for follow-up of her chronic diastolic congestive heart failure, paroxysmal atrial fibrillation and pulmonary hypertension.  Takes her BP and HR daily.   Is in atrial flutter today   Has been more short of breath for the past several days . Has started working part time.   Has had her 1st part of her sleep study .   Needs to go back for her CPAP titration  Is working on weight loss.  Still fluctuating .   Sept, 29, 2020 Kathy [name] is seen for follow up of her chronic diastolic CHF, severe pulmonary HTN, morbid obesity, Wt today is 295  (  up 9 lbs from last visit 6 months )  Has a bump  on the back of her head   Breathing has been ok  Has not been exercising . And is drinking coke.    Is under some stress.   Husband has throat cancer.   06/21/2021[name] seen today for follow-up visit. She has a history of chronic diastolic CHF, severe pulmonary hypertension, morbid obesity and paroxysmal atrial fibrillation.  she was admitted with Atrial fib while in [place]  She got up to get some water and passed out on the way to the fridge. She felt like her HR was irreg.    EMS was called ,  Took her to the hospitial.  She was found to have atrial fib .  She converted on her own .  Did not reguire cardioversion.    Was Started on Diltiazem  Was started on Diltiazem and lasix was increased to 40 mg a day .  Is losing weight - 12 lbs since previoius office visit .   Jan. 25, 2022: Kathy [name] is seen today for follow up of her chronic diastolic CHF, severe pulmonary HTN, morbid obesity, and PAF  Wt today is 282 lbs  Still  has episodes of PAF. - might last for a few minutes [address] and broke her back  - had kyphoplasty.  She is still recovering  She will need another kyphoplasty in 6 weeks  Has some occasional chest twinges above her left breast  Has not been exercisng   Sept.  18, 2023  Kathy [name] is seen for follow up of her diastolic CHF , obesity, HTN, PAF  Started having hot flashes , sweats last   week  Generalized weakness.  Has some nausea  Has post nasal drip ,  No COVID symptoms  No pleuri  The sensation of feeling hot comes and goes.  TSH in Jan. 2023 was normal  HbA1C was 5.8 in Jan, 2023  Has not tested for covid .   No blood in stool.   Past Medical History:  Diagnosis Date   Adenomatous colon polyp    2012   Anxiety    Anxiety and depression    Arthritis    Osteoarthritis, Dr Maureen Ralphs   Cancer [facility])    skin - left foot excised   Chronic diastolic CHF (congestive heart failure) (HCC)    DDD (degenerative disc  disease), cervical    Deaf, right    Depression    Diverticulosis    DVT (deep venous thrombosis) ([place])    RLE DVT 08/2004, post right knee arthroscopy   Dyspnea    occasional- no oxygen   Dysrhythmia    a-fib   Fibromyalgia    Dr Tobie Lords, Rheu   GERD (gastroesophageal reflux disease)    H/O hiatal hernia    Headache(784.0)    PAST HX MILD MIGRAINES - resolved per patient 08/18/20   Heart murmur    mitral valve prolapse--no symptoms   History of colonic polyps 08/08/2011        History of kidney stones    passed stones   HTN (hypertension)    IBS (irritable bowel syndrome)    Internal hemorrhoid    Lumbar radiculopathy    Obesity    OSA on CPAP    Moderate with AHI 20/hr with nocturnal hypoxemia on CPAP   PAF (paroxysmal atrial fibrillation) ([place]) 06/2018   Pulmonary hypertension ([place])    Syncope    idiopathic, ? related to migraines or hyperventilation. Dr Leonie Man, Neurology, YEARS AGO; 02/03/20 in setting of afib with RVR    Past Surgical History:  Procedure Laterality Date   ABDOMINAL HYSTERECTOMY     dysfunctional menses   APPENDECTOMY     BREAST EXCISIONAL BIOPSY Right    CARDIAC CATHETERIZATION  06/26/2018   CHOLECYSTECTOMY     COLONOSCOPY  05/27/2002, 08/08/11   2003 diverticulosis, hemorrhoids 2012 same + cecal polyp   KNEE ARTHROSCOPY  2008   right   KYPHOPLASTY Bilateral 08/19/2020   Procedure: KYPHOPLASTY LUMBAR ONE;  Surgeon: Vallarie Mare, MD;  Location: [facility];  Service: Neurosurgery;  Laterality: Bilateral;  posterior   LEFT HEART CATH AND CORONARY ANGIOGRAPHY N/A 06/26/2018   Procedure: LEFT HEART CATH AND CORONARY ANGIOGRAPHY;  Surgeon: Wellington Hampshire, MD;  [facility];  Service: Cardiovascular;  Laterality: N/A;   MELANOMA EXCISION Left 07/2013   foot   TOTAL HIP ARTHROPLASTY  02/2010   left ; Dr Maureen Ralphs   TOTAL HIP ARTHROPLASTY  03/18/2012   Procedure: TOTAL HIP ARTHROPLASTY;  Surgeon: Gearlean Alf, MD;  Location:  [facility];  Service: Orthopedics;  Laterality: Right;   UPPER GASTROINTESTINAL ENDOSCOPY  04/21/2009, 08/08/11  2010 gastritis ;2012 small hiatal hernia. Dr Carlean Purl   WRIST FRACTURE SURGERY Left     Current Medications: Current Meds  Medication Sig   ALPRAZolam (XANAX) 1 MG tablet TAKE 1 TABLET BY MOUTH THREE TIMES A DAY AS NEEDED FOR ANXIETY   apixaban (ELIQUIS) 5 MG TABS tablet Take 1 tablet (5 mg total) by mouth 2 (two) times daily.   atorvastatin (LIPITOR) 40 MG tablet Take 1 tablet (40 mg total) by mouth daily.   Calcium Carbonate (CALCIUM 600 PO) Take 1 tablet by mouth daily.   clindamycin (CLEOCIN T) 1 % lotion Apply 1 application topically 2 (two) times daily as needed (rosacea (skin irritation)).    clobetasol (TEMOVATE) 0.05 % external solution Apply 1 application topically daily as needed (rosacea).   diltiazem (CARDIZEM CD) 120 MG 24 hr capsule Take 1 capsule (120 mg total) by mouth daily.   fluocinonide (LIDEX) 0.05 % external solution Apply 1 application topically daily as needed (rosacea).   FLUoxetine (PROZAC) 40 MG capsule TAKE 1 CAPSULE BY MOUTH EVERY DAY   furosemide (LASIX) 40 MG tablet TAKE 1 TABLET BY MOUTH DAILY. PLEASE KEEP UPCOMING APPOINTMENT IN JUNE 2023 FOR FUTURE REFILLS.   HYDROcodone-acetaminophen (NORCO/VICODIN) 5-325 MG tablet Take 1 tablet by mouth every 6 (six) hours as needed for moderate pain.   metoprolol succinate (TOPROL-XL) 100 MG 24 hr tablet TAKE 1 TABLET BY MOUTH EVERY DAY   metroNIDAZOLE (METROCREAM) 0.75 % cream Apply 1 application topically 2 (two) times daily as needed (rosacea).    Multiple Vitamin (MULTIVITAMIN WITH MINERALS) TABS tablet Take 1 tablet by mouth daily. Centrum Silver   omeprazole (PRILOSEC) 40 MG capsule TAKE 1 CAPSULE BY MOUTH EVERY DAY   potassium chloride (KLOR-CON) 10 MEQ tablet Take 1 tablet (10 mEq total) by mouth daily.     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  Tobacco Use   Smoking status: Never   Smokeless tobacco: Never  Vaping Use   Vaping Use: Never used  Substance and Sexual Activity   Alcohol use: Yes    Comment: occasional   Drug use: No   Sexual activity: Not on file    Comment: Hysterectomy  Other Topics Concern   Not on file  Social History Narrative   0 caffeine drinks    Social Determinants of Health   Financial Resource Strain: Not on file  Food Insecurity: Not on file  Transportation Needs: Not on file  Physical Activity: Not on file  Stress: Not on file  Social Connections: Not on file     Family History: The patient's family history includes Breast cancer in her paternal aunt; Breast cancer (age of onset: 72) in her mother; COPD in her maternal grandmother and mother; Diabetes in her father and paternal grandmother; Heart attack (age of onset: 79) in her brother; Heart attack (age of onset: 44) in her mother; Heart disease in her paternal grandmother; Heart failure (age of onset: 38) in her father; Hypertension in her father; Stroke in her paternal grandfather. There is no history of Colon cancer.  ROS:   Please see the history of present illness.     All other systems  reviewed and are negative.  EKGs/Labs/Other Studies Reviewed:    The following studies were reviewed today:    Recent Labs: 10/12/2021: ALT 11; BUN 22; Creatinine, Ser 1.03; Hemoglobin 14.6; Platelets 209.0; Potassium 3.6; Sodium 140; TSH 1.96  Recent Lipid Panel    Component Value Date/Time   CHOL 142 10/12/2021 0924   TRIG 98.0 10/12/2021 0924   HDL 51.00 10/12/2021 0924   CHOLHDL 3 10/12/2021 0924   VLDL 19.6 10/12/2021 0924   LDLCALC 71 10/12/2021 0924    LDLDIRECT 143.2 01/22/2013 0856    Physical Exam: Blood pressure (!) 148/98, pulse 84, height 5' 6.5" (1.689 m), weight 287 lb 12.8 oz (130.5 kg), SpO2 96 %.    GEN:  Well nourished, well developed in no acute distress HEENT: Normal NECK: No JVD; No carotid bruits LYMPHATICS: No lymphadenopathy CARDIAC: Irreg. Irreg. , no murmurs, rubs, gallops RESPIRATORY:  Clear to auscultation without rales, wheezing or rhonchi  ABDOMEN: Soft, non-tender, non-distended MUSCULOSKELETAL:  No edema; No deformity  SKIN: Warm and dry NEUROLOGIC:  Alert and oriented x 3     EKG: June 05, 2022: Atrial fibrillation.  Low voltage QRS.   ASSESSMENT:    1. Morbid obesity ([place])   2. Hyperlipidemia, unspecified hyperlipidemia type   3. Chest pressure   4. Dizziness   5. PAF (paroxysmal atrial fibrillation) (HCC)     PLAN:       Chronic diastolic congestive heart failure:       2.  Pulmonary hypertension:    Stable  3.  Paroxysmal atrial fibrillation:      She has been in atrial fibrillation for the past year or so.  She states that it seems to come and go but I have found evidence of fairly persistent atrial fibrillation at this point.  She was thinking that her symptoms of lightheadedness and heat intolerance and sweating was due to the atrial fibrillation but she has not had any of the symptoms over the past year and has clearly been in atrial fibrillation since that time.  I suspect that she may have a viral illness.  4.  Coronary artery disease:   5.  Morbid obesity :       6.  Generalized weakness: Care presents with generalized weakness, sweats, dizziness.  I suspect she may have a viral list.  I encouraged her to go home and check for COVID.  We will draw baseline labs including CBC, basic metabolic profile, TSH.  Her EKG is unchanged from her previous tracings.    Medication Adjustments/Labs and Tests Ordered: Current medicines are reviewed at length with the patient  today.  Concerns regarding medicines are outlined above.  Orders Placed This Encounter  Procedures   Basic metabolic panel   TSH   CBC   EKG 12-Lead   No orders of the defined types were placed in this encounter.    Patient Instructions  Medication Instructions:  Your physician recommends that you continue on your current medications as directed. Please refer to the Current Medication list given to you today.  *If you need a refill on your cardiac medications before your next appointment, please call your pharmacy*   Lab Work: BMET, TSH, CBC Today If you have labs (blood work) drawn today and your tests are completely normal, you will receive your results only by: [place] (if you have MyChart) OR A paper copy in the mail If you have any lab test that is abnormal or we need to change your  treatment, we will call you to review the results.   Testing/Procedures: NONE   Follow-Up: At [facility], you and your health needs are our priority.  As part of our continuing mission to provide you with exceptional heart care, we have created designated Provider Care Teams.  These Care Teams include your primary Cardiologist (physician) and Advanced Practice Providers (APPs -  Physician Assistants and Nurse Practitioners) who all work together to provide you with the care you need, when you need it.  Your next appointment:   6 month(s)  The format for your next appointment:   In Person  Provider:   Mertie Moores, MD       Important Information About Sugar         Signed, Mertie Moores, MD  06/05/2022 5:50 PM    [place]

## 2022-06-05 NOTE — Patient Instructions (Signed)
Medication Instructions:  Your physician recommends that you continue on your current medications as directed. Please refer to the Current Medication list given to you today.  *If you need a refill on your cardiac medications before your next appointment, please call your pharmacy*   Lab Work: BMET, TSH, CBC Today If you have labs (blood work) drawn today and your tests are completely normal, you will receive your results only by: [place] (if you have MyChart) OR A paper copy in the mail If you have any lab test that is abnormal or we need to change your treatment, we will call you to review the results.   Testing/Procedures: NONE   Follow-Up: At [facility], you and your health needs are our priority.  As part of our continuing mission to provide you with exceptional heart care, we have created designated Provider Care Teams.  These Care Teams include your primary Cardiologist (physician) and Advanced Practice Providers (APPs -  Physician Assistants and Nurse Practitioners) who all work together to provide you with the care you need, when you need it.  Your next appointment:   6 month(s)  The format for your next appointment:   In Person  Provider:   Mertie Moores, MD       Important Information About Sugar

## 2022-06-06 ENCOUNTER — Encounter: Payer: Self-pay | Admitting: Internal Medicine

## 2022-06-06 ENCOUNTER — Ambulatory Visit: Payer: Medicare Other | Admitting: Internal Medicine

## 2022-06-06 VITALS — BP 148/96 | HR 85 | Temp 97.8°F | Ht 66.5 in | Wt 290.6 lb

## 2022-06-06 DIAGNOSIS — R531 Weakness: Secondary | ICD-10-CM

## 2022-06-06 DIAGNOSIS — F419 Anxiety disorder, unspecified: Secondary | ICD-10-CM

## 2022-06-06 DIAGNOSIS — R6883 Chills (without fever): Secondary | ICD-10-CM | POA: Diagnosis not present

## 2022-06-06 DIAGNOSIS — R5383 Other fatigue: Secondary | ICD-10-CM | POA: Diagnosis not present

## 2022-06-06 DIAGNOSIS — M797 Fibromyalgia: Secondary | ICD-10-CM

## 2022-06-06 DIAGNOSIS — G8929 Other chronic pain: Secondary | ICD-10-CM

## 2022-06-06 DIAGNOSIS — M545 Low back pain, unspecified: Secondary | ICD-10-CM

## 2022-06-06 LAB — CBC
Hematocrit: 45.8 % (ref 34.0–46.6)
Hemoglobin: 15.5 g/dL (ref 11.1–15.9)
MCH: 30.8 pg (ref 26.6–33.0)
MCHC: 33.8 g/dL (ref 31.5–35.7)
MCV: 91 fL (ref 79–97)
Platelets: 255 10*3/uL (ref 150–450)
RBC: 5.04 x10E6/uL (ref 3.77–5.28)
RDW: 12.9 % (ref 11.7–15.4)
WBC: 11.3 10*3/uL — ABNORMAL HIGH (ref 3.4–10.8)

## 2022-06-06 LAB — BASIC METABOLIC PANEL
BUN/Creatinine Ratio: 21 (ref 12–28)
BUN: 25 mg/dL (ref 8–27)
CO2: 28 mmol/L (ref 20–29)
Calcium: 9.5 mg/dL (ref 8.7–10.3)
Chloride: 95 mmol/L — ABNORMAL LOW (ref 96–106)
Creatinine, Ser: 1.19 mg/dL — ABNORMAL HIGH (ref 0.57–1.00)
Glucose: 83 mg/dL (ref 70–99)
Potassium: 4.5 mmol/L (ref 3.5–5.2)
Sodium: 141 mmol/L (ref 134–144)
eGFR: 49 mL/min/{1.73_m2} — ABNORMAL LOW (ref >59)

## 2022-06-06 LAB — TSH: TSH: 1.67 u[IU]/mL (ref 0.450–4.500)

## 2022-06-06 LAB — POCT INFLUENZA A/B
Influenza A, POC: NEGATIVE
Influenza B, POC: NEGATIVE

## 2022-06-06 LAB — POC COVID19 BINAXNOW: SARS Coronavirus 2 Ag: NEGATIVE

## 2022-06-06 MED ORDER — HYDROCODONE-ACETAMINOPHEN 5-325 MG PO TABS: 1.0000 | ORAL_TABLET | Freq: Four times a day (QID) | ORAL | 0 refills | Status: DC | PRN

## 2022-06-06 MED ORDER — ALPRAZOLAM 1 MG PO TABS: ORAL_TABLET | ORAL | 0 refills | Status: DC

## 2022-06-06 NOTE — Progress Notes (Signed)
Established Patient Office Visit     CC/Reason for Visit: flu-like symptoms  HPI: Kathy [name] is a 71 y.o. female who is coming in today for the above mentioned reasons.  For the past 3 days she has been having chills, subjective fevers, diaphoresis, fatigue, postnasal drip.  No nasal congestion, no sore throat.  She saw her cardiologist yesterday thinking that it was her A-fib.  She was asked to see me today for consideration of a viral illness.   Past Medical/Surgical History: Past Medical History:  Diagnosis Date   Adenomatous colon polyp    2012   Anxiety    Anxiety and depression    Arthritis    Osteoarthritis, Dr Maureen Ralphs   Cancer [facility])    skin - left foot excised   Chronic diastolic CHF (congestive heart failure) (HCC)    DDD (degenerative disc disease), cervical    Deaf, right    Depression    Diverticulosis    DVT (deep venous thrombosis) ([place])    RLE DVT 08/2004, post right knee arthroscopy   Dyspnea    occasional- no oxygen   Dysrhythmia    a-fib   Fibromyalgia    Dr Tobie Lords, Rheu   GERD (gastroesophageal reflux disease)    H/O hiatal hernia    Headache(784.0)    PAST HX MILD MIGRAINES - resolved per patient 08/18/20   Heart murmur    mitral valve prolapse--no symptoms   History of colonic polyps 08/08/2011        History of kidney stones    passed stones   HTN (hypertension)    IBS (irritable bowel syndrome)    Internal hemorrhoid    Lumbar radiculopathy    Obesity    OSA on CPAP    Moderate with AHI 20/hr with nocturnal hypoxemia on CPAP   PAF (paroxysmal atrial fibrillation) ([place]) 06/2018   Pulmonary hypertension ([place])    Syncope    idiopathic, ? related to migraines or hyperventilation. Dr Leonie Man, Neurology, YEARS AGO; 02/03/20 in setting of afib with RVR    Past Surgical History:  Procedure Laterality Date   ABDOMINAL HYSTERECTOMY     dysfunctional menses   APPENDECTOMY     BREAST EXCISIONAL BIOPSY Right    CARDIAC  CATHETERIZATION  06/26/2018   CHOLECYSTECTOMY     COLONOSCOPY  05/27/2002, 08/08/11   2003 diverticulosis, hemorrhoids 2012 same + cecal polyp   KNEE ARTHROSCOPY  2008   right   KYPHOPLASTY Bilateral 08/19/2020   Procedure: KYPHOPLASTY LUMBAR ONE;  Surgeon: Vallarie Mare, MD;  Location: [place];  Service: Neurosurgery;  Laterality: Bilateral;  posterior   LEFT HEART CATH AND CORONARY ANGIOGRAPHY N/A 06/26/2018   Procedure: LEFT HEART CATH AND CORONARY ANGIOGRAPHY;  Surgeon: Wellington Hampshire, MD;  Location: [facility];  Service: Cardiovascular;  Laterality: N/A;   MELANOMA EXCISION Left 07/2013   foot   TOTAL HIP ARTHROPLASTY  02/2010   left ; Dr Maureen Ralphs   TOTAL HIP ARTHROPLASTY  03/18/2012   Procedure: TOTAL HIP ARTHROPLASTY;  Surgeon: Gearlean Alf, MD;  [facility];  Service: Orthopedics;  Laterality: Right;   UPPER GASTROINTESTINAL ENDOSCOPY  04/21/2009, 08/08/11   2010 gastritis ;2012 small hiatal hernia. Dr Carlean Purl   WRIST FRACTURE SURGERY Left     Social History:  reports that she has never smoked. She has never used smokeless tobacco. She reports current alcohol use. She reports that she does not use drugs.  Allergies: Allergies  Allergen Reactions   Achromycin [Tetracycline Hcl] Shortness Of Breath    Dyspnea     Penicillins     Facial angioedema Did it involve swelling of the face/tongue/throat, SOB, or low BP? Yes Did it involve sudden or severe rash/hives, skin peeling, or any reaction on the inside of your mouth or nose? No Did you need to seek medical attention at a hospital or doctor's office? No When did it last happen?      30+ years If all above answers are "NO", may proceed with cephalosporin use.    Sulfamethoxazole-Trimethoprim Hives   Nickel Itching   Zanaflex [Tizanidine Hydrochloride]     Causes more pain     Family History:  Family History  Problem Relation Age of Onset   Heart attack Mother 42   COPD Mother    Breast cancer  Mother 39   Hypertension Father    Diabetes Father    Heart failure Father 48   Breast cancer Paternal Aunt        cns mets   COPD Maternal Grandmother    Heart disease Paternal Grandmother        died 60   Diabetes Paternal Grandmother    Stroke Paternal Grandfather        in late 52s   Heart attack Brother 31   Colon cancer Neg Hx      Current Outpatient Medications:    ALPRAZolam (XANAX) 1 MG tablet, TAKE 1 TABLET BY MOUTH THREE TIMES A DAY AS NEEDED FOR ANXIETY, Disp: 90 tablet, Rfl: 0   apixaban (ELIQUIS) 5 MG TABS tablet, Take 1 tablet (5 mg total) by mouth 2 (two) times daily., Disp: 56 tablet, Rfl: 0   atorvastatin (LIPITOR) 40 MG tablet, Take 1 tablet (40 mg total) by mouth daily., Disp: 90 tablet, Rfl: 3   Calcium Carbonate (CALCIUM 600 PO), Take 1 tablet by mouth daily., Disp: , Rfl:    clindamycin (CLEOCIN T) 1 % lotion, Apply 1 application topically 2 (two) times daily as needed (rosacea (skin irritation)). , Disp: , Rfl:    clobetasol (TEMOVATE) 0.05 % external solution, Apply 1 application topically daily as needed (rosacea)., Disp: , Rfl:    diltiazem (CARDIZEM CD) 120 MG 24 hr capsule, Take 1 capsule (120 mg total) by mouth daily., Disp: 90 capsule, Rfl: 3   fluocinonide (LIDEX) 0.05 % external solution, Apply 1 application topically daily as needed (rosacea)., Disp: , Rfl:    FLUoxetine (PROZAC) 40 MG capsule, TAKE 1 CAPSULE BY MOUTH EVERY DAY, Disp: 90 capsule, Rfl: 0   furosemide (LASIX) 40 MG tablet, TAKE 1 TABLET BY MOUTH DAILY. PLEASE KEEP UPCOMING APPOINTMENT IN JUNE 2023 FOR FUTURE REFILLS., Disp: 90 tablet, Rfl: 1   HYDROcodone-acetaminophen (NORCO/VICODIN) 5-325 MG tablet, Take 1 tablet by mouth every 6 (six) hours as needed for moderate pain., Disp: 30 tablet, Rfl: 0   methocarbamol (ROBAXIN) 500 MG tablet, Take 500 mg by mouth every 8 (eight) hours as needed., Disp: , Rfl:    metoprolol succinate (TOPROL-XL) 100 MG 24 hr tablet, TAKE 1 TABLET BY MOUTH EVERY  DAY, Disp: 30 tablet, Rfl: 5   metroNIDAZOLE (METROCREAM) 0.75 % cream, Apply 1 application topically 2 (two) times daily as needed (rosacea). , Disp: , Rfl:    Multiple Vitamin (MULTIVITAMIN WITH MINERALS) TABS tablet, Take 1 tablet by mouth daily. Centrum Silver, Disp: , Rfl:    omeprazole (PRILOSEC) 40 MG capsule, TAKE 1 CAPSULE BY MOUTH EVERY DAY, Disp: 90 capsule,  Rfl: 0   potassium chloride (KLOR-CON) 10 MEQ tablet, Take 1 tablet (10 mEq total) by mouth daily., Disp: 90 tablet, Rfl: 3  Review of Systems:  Constitutional: Positive for fever, chills, diaphoresis, appetite change and fatigue.  HEENT: Denies photophobia, eye pain, redness,  mouth sores, trouble swallowing, neck pain, neck stiffness and tinnitus.   Respiratory: Denies SOB, DOE, cough, chest tightness,  and wheezing.   Cardiovascular: Denies chest pain, palpitations and leg swelling.  Gastrointestinal: Denies nausea, vomiting, abdominal pain, diarrhea, constipation, blood in stool and abdominal distention.  Genitourinary: Denies dysuria, urgency, frequency, hematuria, flank pain and difficulty urinating.  Endocrine: Denies: hot or cold intolerance, sweats, changes in hair or nails, polyuria, polydipsia. Musculoskeletal: Denies myalgias, back pain, joint swelling, arthralgias and gait problem.  Skin: Denies pallor, rash and wound.  Neurological: Denies dizziness, seizures, syncope,  light-headedness, numbness and headaches.  Hematological: Denies adenopathy. Easy bruising, personal or family bleeding history  Psychiatric/Behavioral: Denies suicidal ideation, mood changes, confusion, nervousness, sleep disturbance and agitation    Physical Exam: Vitals:   06/06/22 1419 06/06/22 1430  BP: (!) 148/90 (!) 148/96  Pulse: 85   Temp: 97.8 F (36.6 C)   SpO2: 96%   Weight: 290 lb 9.6 oz (131.8 kg)   Height: 5' 6.5" (1.689 m)     Body mass index is 46.2 kg/m.   Constitutional: NAD, calm, comfortable Eyes: PERRL, lids and  conjunctivae normal ENMT: Mucous membranes are moist.  Respiratory: clear to auscultation bilaterally, no wheezing, no crackles. Normal respiratory effort. No accessory muscle use.  Cardiovascular: Regular rate and irregular rhythm, no murmurs / rubs / gallops. No extremity edema.  Psychiatric: Normal judgment and insight. Alert and oriented x 3. Normal mood.    Impression and Plan:  Chills  Weakness generalized  Fatigue, unspecified type  -In office flu and COVID tests are resulted negative. -Given her symptoms I think it is likely that she has an unspecified viral illness.  Have advised rest, fluids.  She will follow-up with me in 10 to 14 days if no improvement.  Time spent:23 minutes reviewing chart, interviewing and examining patient and formulating plan of care.     Lelon Frohlich, MD [facility]

## 2022-06-26 ENCOUNTER — Ambulatory Visit: Payer: Medicare Other | Admitting: Internal Medicine

## 2022-06-26 ENCOUNTER — Encounter: Payer: Self-pay | Admitting: Internal Medicine

## 2022-06-26 VITALS — BP 130/90 | HR 77 | Temp 97.6°F | Wt 290.9 lb

## 2022-06-26 DIAGNOSIS — R5383 Other fatigue: Secondary | ICD-10-CM

## 2022-06-26 DIAGNOSIS — M549 Dorsalgia, unspecified: Secondary | ICD-10-CM | POA: Diagnosis not present

## 2022-06-26 DIAGNOSIS — D72829 Elevated white blood cell count, unspecified: Secondary | ICD-10-CM

## 2022-06-26 DIAGNOSIS — R109 Unspecified abdominal pain: Secondary | ICD-10-CM

## 2022-06-26 DIAGNOSIS — N3000 Acute cystitis without hematuria: Secondary | ICD-10-CM

## 2022-06-26 LAB — POC URINALSYSI DIPSTICK (AUTOMATED)
Bilirubin, UA: POSITIVE
Blood, UA: NEGATIVE
Glucose, UA: NEGATIVE
Ketones, UA: POSITIVE
Nitrite, UA: NEGATIVE
Protein, UA: POSITIVE — AB
Urobilinogen, UA: 0.2 E.U./dL
pH, UA: 6 (ref 5.0–8.0)

## 2022-06-26 MED ORDER — CIPROFLOXACIN HCL 500 MG PO TABS: 500.0000 mg | ORAL_TABLET | Freq: Two times a day (BID) | ORAL | 0 refills | Status: AC

## 2022-06-26 NOTE — Progress Notes (Signed)
Established Patient Office Visit     CC/Reason for Visit: Continued fatigue, weakness  HPI: Kathy [name] is a 71 y.o. female who is coming in today for the above mentioned reasons.  She saw me 3 weeks ago complaining of fatigue, weakness.  She had a mild leukocytosis and was thought to have an unspecified viral illness.  She returns today because she is still complaining of the same issues.  In addition she is now having some right flank and right groin pain.  She denies dysuria, urinary frequency or urgency.   Past Medical/Surgical History: Past Medical History:  Diagnosis Date   Adenomatous colon polyp    2012   Anxiety    Anxiety and depression    Arthritis    Osteoarthritis, Dr Maureen Ralphs   Cancer [facility])    skin - left foot excised   Chronic diastolic CHF (congestive heart failure) (HCC)    DDD (degenerative disc disease), cervical    Deaf, right    Depression    Diverticulosis    DVT (deep venous thrombosis) ([place])    RLE DVT 08/2004, post right knee arthroscopy   Dyspnea    occasional- no oxygen   Dysrhythmia    a-fib   Fibromyalgia    Dr Tobie Lords, Rheu   GERD (gastroesophageal reflux disease)    H/O hiatal hernia    Headache(784.0)    PAST HX MILD MIGRAINES - resolved per patient 08/18/20   Heart murmur    mitral valve prolapse--no symptoms   History of colonic polyps 08/08/2011        History of kidney stones    passed stones   HTN (hypertension)    IBS (irritable bowel syndrome)    Internal hemorrhoid    Lumbar radiculopathy    Obesity    OSA on CPAP    Moderate with AHI 20/hr with nocturnal hypoxemia on CPAP   PAF (paroxysmal atrial fibrillation) ([place]) 06/2018   Pulmonary hypertension ([place])    Syncope    idiopathic, ? related to migraines or hyperventilation. Dr Leonie Man, Neurology, YEARS AGO; 02/03/20 in setting of afib with RVR    Past Surgical History:  Procedure Laterality Date   ABDOMINAL HYSTERECTOMY     dysfunctional menses    APPENDECTOMY     BREAST EXCISIONAL BIOPSY Right    CARDIAC CATHETERIZATION  06/26/2018   CHOLECYSTECTOMY     COLONOSCOPY  05/27/2002, 08/08/11   2003 diverticulosis, hemorrhoids 2012 same + cecal polyp   KNEE ARTHROSCOPY  2008   right   KYPHOPLASTY Bilateral 08/19/2020   Procedure: KYPHOPLASTY LUMBAR ONE;  Surgeon: Vallarie Mare, MD;  Location: [place];  Service: Neurosurgery;  Laterality: Bilateral;  posterior   LEFT HEART CATH AND CORONARY ANGIOGRAPHY N/A 06/26/2018   Procedure: LEFT HEART CATH AND CORONARY ANGIOGRAPHY;  Surgeon: Wellington Hampshire, MD;  Location: [place] [facility];  Service: Cardiovascular;  Laterality: N/A;   MELANOMA EXCISION Left 07/2013   foot   TOTAL HIP ARTHROPLASTY  02/2010   left ; Dr Maureen Ralphs   TOTAL HIP ARTHROPLASTY  03/18/2012   Procedure: TOTAL HIP ARTHROPLASTY;  Surgeon: Gearlean Alf, MD;  Location: [facility];  Service: Orthopedics;  Laterality: Right;   UPPER GASTROINTESTINAL ENDOSCOPY  04/21/2009, 08/08/11   2010 gastritis ;2012 small hiatal hernia. Dr Carlean Purl   WRIST FRACTURE SURGERY Left     Social History:  reports that she has never smoked. She has never used smokeless tobacco. She reports current alcohol  use. She reports that she does not use drugs.  Allergies: Allergies  Allergen Reactions   Achromycin [Tetracycline Hcl] Shortness Of Breath    Dyspnea     Penicillins     Facial angioedema Did it involve swelling of the face/tongue/throat, SOB, or low BP? Yes Did it involve sudden or severe rash/hives, skin peeling, or any reaction on the inside of your mouth or nose? No Did you need to seek medical attention at a hospital or doctor's office? No When did it last happen?      30+ years If all above answers are "NO", may proceed with cephalosporin use.    Sulfamethoxazole-Trimethoprim Hives   Nickel Itching   Zanaflex [Tizanidine Hydrochloride]     Causes more pain     Family History:  Family History  Problem Relation Age of Onset    Heart attack Mother 63   COPD Mother    Breast cancer Mother 71   Hypertension Father    Diabetes Father    Heart failure Father 64   Breast cancer Paternal Aunt        cns mets   COPD Maternal Grandmother    Heart disease Paternal Grandmother        died 63   Diabetes Paternal Grandmother    Stroke Paternal Grandfather        in late 31s   Heart attack Brother 38   Colon cancer Neg Hx      Current Outpatient Medications:    ALPRAZolam (XANAX) 1 MG tablet, TAKE 1 TABLET BY MOUTH THREE TIMES A DAY AS NEEDED FOR ANXIETY, Disp: 90 tablet, Rfl: 0   apixaban (ELIQUIS) 5 MG TABS tablet, Take 1 tablet (5 mg total) by mouth 2 (two) times daily., Disp: 56 tablet, Rfl: 0   atorvastatin (LIPITOR) 40 MG tablet, Take 1 tablet (40 mg total) by mouth daily., Disp: 90 tablet, Rfl: 3   Calcium Carbonate (CALCIUM 600 PO), Take 1 tablet by mouth daily., Disp: , Rfl:    ciprofloxacin (CIPRO) 500 MG tablet, Take 1 tablet (500 mg total) by mouth 2 (two) times daily for 7 days., Disp: 14 tablet, Rfl: 0   clindamycin (CLEOCIN T) 1 % lotion, Apply 1 application topically 2 (two) times daily as needed (rosacea (skin irritation)). , Disp: , Rfl:    clobetasol (TEMOVATE) 0.05 % external solution, Apply 1 application topically daily as needed (rosacea)., Disp: , Rfl:    diltiazem (CARDIZEM CD) 120 MG 24 hr capsule, Take 1 capsule (120 mg total) by mouth daily., Disp: 90 capsule, Rfl: 3   fluocinonide (LIDEX) 0.05 % external solution, Apply 1 application topically daily as needed (rosacea)., Disp: , Rfl:    FLUoxetine (PROZAC) 40 MG capsule, TAKE 1 CAPSULE BY MOUTH EVERY DAY, Disp: 90 capsule, Rfl: 0   furosemide (LASIX) 40 MG tablet, TAKE 1 TABLET BY MOUTH DAILY. PLEASE KEEP UPCOMING APPOINTMENT IN JUNE 2023 FOR FUTURE REFILLS., Disp: 90 tablet, Rfl: 1   HYDROcodone-acetaminophen (NORCO/VICODIN) 5-325 MG tablet, Take 1 tablet by mouth every 6 (six) hours as needed for moderate pain., Disp: 30 tablet, Rfl: 0    methocarbamol (ROBAXIN) 500 MG tablet, Take 500 mg by mouth every 8 (eight) hours as needed., Disp: , Rfl:    metoprolol succinate (TOPROL-XL) 100 MG 24 hr tablet, TAKE 1 TABLET BY MOUTH EVERY DAY, Disp: 30 tablet, Rfl: 5   metroNIDAZOLE (METROCREAM) 0.75 % cream, Apply 1 application topically 2 (two) times daily as needed (rosacea). , Disp: ,  Rfl:    Multiple Vitamin (MULTIVITAMIN WITH MINERALS) TABS tablet, Take 1 tablet by mouth daily. Centrum Silver, Disp: , Rfl:    omeprazole (PRILOSEC) 40 MG capsule, TAKE 1 CAPSULE BY MOUTH EVERY DAY, Disp: 90 capsule, Rfl: 0   potassium chloride (KLOR-CON) 10 MEQ tablet, Take 1 tablet (10 mEq total) by mouth daily., Disp: 90 tablet, Rfl: 3  Review of Systems:  Constitutional: Denies fever, chills, diaphoresis, appetite change.  HEENT: Denies photophobia, eye pain, redness, hearing loss, ear pain, congestion, sore throat, rhinorrhea, sneezing, mouth sores, trouble swallowing, neck pain, neck stiffness and tinnitus.   Respiratory: Denies SOB, DOE, cough, chest tightness,  and wheezing.   Cardiovascular: Denies chest pain, palpitations and leg swelling.  Gastrointestinal: Denies nausea, vomiting,  diarrhea, constipation, blood in stool and abdominal distention.  Genitourinary: Denies dysuria, urgency, frequency, hematuria, flank pain and difficulty urinating.  Endocrine: Denies: hot or cold intolerance, sweats, changes in hair or nails, polyuria, polydipsia. Musculoskeletal: Positive for myalgias, back pain, joint swelling, arthralgias and gait problem.  Skin: Denies pallor, rash and wound.  Neurological: Denies dizziness, seizures, syncope, weakness, light-headedness, numbness and headaches.  Hematological: Denies adenopathy. Easy bruising, personal or family bleeding history  Psychiatric/Behavioral: Denies suicidal ideation, mood changes, confusion, nervousness, sleep disturbance and agitation    Physical Exam: Vitals:   06/26/22 1407 06/26/22 1439   BP: (!) 132/92 (!) 130/90  Pulse: 77   Temp: 97.6 F (36.4 C)   TempSrc: Oral   SpO2: 95%   Weight: 290 lb 14.4 oz (132 kg)     Body mass index is 46.25 kg/m.   Constitutional: NAD, calm, comfortable Eyes: PERRL, lids and conjunctivae normal ENMT: Mucous membranes are moist.  Cardiovascular: Regular rate and rhythm, no murmurs / rubs / gallops. No extremity edema. 2+ pedal pulses. No carotid bruits.  Abdomen: no tenderness, no masses palpated. No hepatosplenomegaly. Bowel sounds positive.   Psychiatric: Normal judgment and insight. Alert and oriented x 3. Normal mood.    Impression and Plan:  Fatigue, unspecified type - Plan: CANCELED: CBC with Differential/Platelet  Leukocytosis, unspecified type - Plan: CANCELED: CBC with Differential/Platelet  Right flank pain - Plan: US Renal  Back pain, unspecified back location, unspecified back pain laterality, unspecified chronicity - Plan: POCT Urinalysis Dipstick (Automated), Culture, Urine  Acute cystitis without hematuria - Plan: ciprofloxacin (CIPRO) 500 MG tablet  -In office urine dipstick is positive for leukocytes.  Suspect indolent UTI, with right flank and right groin pain also wonder about kidney stone.  Will order renal ultrasound. -Suspect her leukocytosis and fatigue are related to this. -Cipro for 7 days ordered.  She has allergies to penicillin and sulfa.   Time spent:32 minutes reviewing chart, interviewing and examining patient and formulating plan of care.    Lelon Frohlich, MD  Primary Care at [facility]

## 2022-06-28 ENCOUNTER — Ambulatory Visit (HOSPITAL_BASED_OUTPATIENT_CLINIC_OR_DEPARTMENT_OTHER)
Admission: RE | Admit: 2022-06-28 | Discharge: 2022-06-28 | Disposition: A | Discharge status: Home / Self Care | Payer: Medicare Other | Source: Ambulatory Visit | Attending: Internal Medicine | Admitting: Internal Medicine

## 2022-06-28 ENCOUNTER — Encounter: Payer: Self-pay | Admitting: Internal Medicine

## 2022-06-28 DIAGNOSIS — R109 Unspecified abdominal pain: Secondary | ICD-10-CM | POA: Insufficient documentation

## 2022-06-28 LAB — URINE CULTURE
MICRO NUMBER:: 14026378
Result:: NO GROWTH
SPECIMEN QUALITY:: ADEQUATE

## 2022-06-29 NOTE — Telephone Encounter (Signed)
Patient informed of the results and verbalized understanding 

## 2022-07-03 ENCOUNTER — Ambulatory Visit (HOSPITAL_COMMUNITY)
Admission: RE | Admit: 2022-07-03 | Discharge: 2022-07-03 | Disposition: A | Discharge status: Home / Self Care | Payer: Medicare Other | Source: Ambulatory Visit | Attending: Cardiology | Admitting: Cardiology

## 2022-07-03 ENCOUNTER — Other Ambulatory Visit (HOSPITAL_COMMUNITY): Payer: Self-pay | Admitting: Family Medicine

## 2022-07-03 DIAGNOSIS — M7989 Other specified soft tissue disorders: Secondary | ICD-10-CM | POA: Diagnosis not present

## 2022-07-03 DIAGNOSIS — M79605 Pain in left leg: Secondary | ICD-10-CM | POA: Diagnosis present

## 2022-07-07 ENCOUNTER — Other Ambulatory Visit: Payer: Self-pay | Admitting: Internal Medicine

## 2022-07-07 DIAGNOSIS — K219 Gastro-esophageal reflux disease without esophagitis: Secondary | ICD-10-CM

## 2022-07-07 DIAGNOSIS — F419 Anxiety disorder, unspecified: Secondary | ICD-10-CM

## 2022-07-11 ENCOUNTER — Telehealth: Payer: Self-pay | Admitting: Internal Medicine

## 2022-07-11 NOTE — Telephone Encounter (Signed)
Patient dropped off paperwork needing to be completed by Dr.Hernandez.     Paperwork was placed in folder for completion          Please advise

## 2022-07-19 NOTE — Telephone Encounter (Signed)
Left message on machine for patient that her form is ready to be picked up.

## 2022-07-21 ENCOUNTER — Emergency Department (HOSPITAL_COMMUNITY): Payer: Medicare Other

## 2022-07-21 ENCOUNTER — Other Ambulatory Visit: Payer: Self-pay

## 2022-07-21 ENCOUNTER — Encounter (HOSPITAL_COMMUNITY): Payer: Self-pay | Admitting: Emergency Medicine

## 2022-07-21 ENCOUNTER — Observation Stay (HOSPITAL_COMMUNITY)
Admission: EM | Admit: 2022-07-21 | Discharge: 2022-07-22 | Disposition: A | Discharge status: Home Health | Payer: Medicare Other | Attending: Internal Medicine | Admitting: Internal Medicine

## 2022-07-21 DIAGNOSIS — W01190A Fall on same level from slipping, tripping and stumbling with subsequent striking against furniture, initial encounter: Secondary | ICD-10-CM | POA: Insufficient documentation

## 2022-07-21 DIAGNOSIS — S39012A Strain of muscle, fascia and tendon of lower back, initial encounter: Secondary | ICD-10-CM | POA: Diagnosis not present

## 2022-07-21 DIAGNOSIS — Z96643 Presence of artificial hip joint, bilateral: Secondary | ICD-10-CM | POA: Insufficient documentation

## 2022-07-21 DIAGNOSIS — R55 Syncope and collapse: Secondary | ICD-10-CM | POA: Insufficient documentation

## 2022-07-21 DIAGNOSIS — I10 Essential (primary) hypertension: Secondary | ICD-10-CM | POA: Diagnosis present

## 2022-07-21 DIAGNOSIS — R42 Dizziness and giddiness: Secondary | ICD-10-CM | POA: Diagnosis not present

## 2022-07-21 DIAGNOSIS — W19XXXA Unspecified fall, initial encounter: Secondary | ICD-10-CM

## 2022-07-21 DIAGNOSIS — Z7901 Long term (current) use of anticoagulants: Secondary | ICD-10-CM | POA: Insufficient documentation

## 2022-07-21 DIAGNOSIS — R519 Headache, unspecified: Secondary | ICD-10-CM | POA: Diagnosis present

## 2022-07-21 DIAGNOSIS — Y92009 Unspecified place in unspecified non-institutional (private) residence as the place of occurrence of the external cause: Secondary | ICD-10-CM | POA: Insufficient documentation

## 2022-07-21 DIAGNOSIS — Z85828 Personal history of other malignant neoplasm of skin: Secondary | ICD-10-CM | POA: Insufficient documentation

## 2022-07-21 DIAGNOSIS — Z86718 Personal history of other venous thrombosis and embolism: Secondary | ICD-10-CM | POA: Insufficient documentation

## 2022-07-21 DIAGNOSIS — F32A Depression, unspecified: Secondary | ICD-10-CM | POA: Diagnosis present

## 2022-07-21 DIAGNOSIS — G894 Chronic pain syndrome: Secondary | ICD-10-CM | POA: Diagnosis present

## 2022-07-21 DIAGNOSIS — Z79899 Other long term (current) drug therapy: Secondary | ICD-10-CM | POA: Insufficient documentation

## 2022-07-21 DIAGNOSIS — I5032 Chronic diastolic (congestive) heart failure: Secondary | ICD-10-CM | POA: Diagnosis present

## 2022-07-21 DIAGNOSIS — F419 Anxiety disorder, unspecified: Secondary | ICD-10-CM | POA: Diagnosis present

## 2022-07-21 DIAGNOSIS — I4819 Other persistent atrial fibrillation: Secondary | ICD-10-CM | POA: Diagnosis present

## 2022-07-21 DIAGNOSIS — I110 Hypertensive heart disease with heart failure: Secondary | ICD-10-CM | POA: Diagnosis not present

## 2022-07-21 DIAGNOSIS — S060X9A Concussion with loss of consciousness of unspecified duration, initial encounter: Secondary | ICD-10-CM

## 2022-07-21 DIAGNOSIS — I480 Paroxysmal atrial fibrillation: Secondary | ICD-10-CM | POA: Insufficient documentation

## 2022-07-21 DIAGNOSIS — G4733 Obstructive sleep apnea (adult) (pediatric): Secondary | ICD-10-CM

## 2022-07-21 LAB — COMPREHENSIVE METABOLIC PANEL
ALT: 15 U/L (ref 0–44)
AST: 19 U/L (ref 15–41)
Albumin: 3.5 g/dL (ref 3.5–5.0)
Alkaline Phosphatase: 92 U/L (ref 38–126)
Anion gap: 9 (ref 5–15)
BUN: 15 mg/dL (ref 8–23)
CO2: 31 mmol/L (ref 22–32)
Calcium: 9.1 mg/dL (ref 8.9–10.3)
Chloride: 101 mmol/L (ref 98–111)
Creatinine, Ser: 1.15 mg/dL — ABNORMAL HIGH (ref 0.44–1.00)
GFR, Estimated: 51 mL/min — ABNORMAL LOW (ref >60)
Glucose, Bld: 120 mg/dL — ABNORMAL HIGH (ref 70–99)
Potassium: 4 mmol/L (ref 3.5–5.1)
Sodium: 141 mmol/L (ref 135–145)
Total Bilirubin: 1.1 mg/dL (ref 0.3–1.2)
Total Protein: 6.4 g/dL — ABNORMAL LOW (ref 6.5–8.1)

## 2022-07-21 LAB — CBC WITH DIFFERENTIAL/PLATELET
Abs Immature Granulocytes: 0.05 10*3/uL (ref 0.00–0.07)
Basophils Absolute: 0 10*3/uL (ref 0.0–0.1)
Basophils Relative: 0 %
Eosinophils Absolute: 0.2 10*3/uL (ref 0.0–0.5)
Eosinophils Relative: 2 %
HCT: 41.8 % (ref 36.0–46.0)
Hemoglobin: 12.8 g/dL (ref 12.0–15.0)
Immature Granulocytes: 1 %
Lymphocytes Relative: 14 %
Lymphs Abs: 1 10*3/uL (ref 0.7–4.0)
MCH: 29.8 pg (ref 26.0–34.0)
MCHC: 30.6 g/dL (ref 30.0–36.0)
MCV: 97.4 fL (ref 80.0–100.0)
Monocytes Absolute: 0.5 10*3/uL (ref 0.1–1.0)
Monocytes Relative: 7 %
Neutro Abs: 5.5 10*3/uL (ref 1.7–7.7)
Neutrophils Relative %: 76 %
Platelets: 220 10*3/uL (ref 150–400)
RBC: 4.29 MIL/uL (ref 3.87–5.11)
RDW: 15.5 % (ref 11.5–15.5)
WBC: 7.2 10*3/uL (ref 4.0–10.5)
nRBC: 0 % (ref 0.0–0.2)

## 2022-07-21 LAB — I-STAT CHEM 8, ED
BUN: 16 mg/dL (ref 8–23)
Calcium, Ion: 1.09 mmol/L — ABNORMAL LOW (ref 1.15–1.40)
Chloride: 99 mmol/L (ref 98–111)
Creatinine, Ser: 1.1 mg/dL — ABNORMAL HIGH (ref 0.44–1.00)
Glucose, Bld: 117 mg/dL — ABNORMAL HIGH (ref 70–99)
HCT: 40 % (ref 36.0–46.0)
Hemoglobin: 13.6 g/dL (ref 12.0–15.0)
Potassium: 3.9 mmol/L (ref 3.5–5.1)
Sodium: 139 mmol/L (ref 135–145)
TCO2: 29 mmol/L (ref 22–32)

## 2022-07-21 LAB — PROTIME-INR
INR: 1.5 — ABNORMAL HIGH (ref 0.8–1.2)
Prothrombin Time: 17.9 seconds — ABNORMAL HIGH (ref 11.4–15.2)

## 2022-07-21 MED ORDER — SODIUM CHLORIDE 0.9 % IV SOLN
INTRAVENOUS | Status: AC | PRN
  Administered 2022-07-21: 1000 mL via INTRAVENOUS

## 2022-07-21 MED ORDER — KETOROLAC TROMETHAMINE 15 MG/ML IJ SOLN
15.0000 mg | Freq: Once | INTRAMUSCULAR | Status: AC
  Administered 2022-07-21: 15 mg via INTRAVENOUS
  Filled 2022-07-21: qty 1

## 2022-07-21 MED ORDER — ONDANSETRON HCL 4 MG/2ML IJ SOLN
INTRAMUSCULAR | Status: AC
  Filled 2022-07-21: qty 2

## 2022-07-21 MED ORDER — ONDANSETRON HCL 4 MG/2ML IJ SOLN: 4.0000 mg | Freq: Four times a day (QID) | INTRAMUSCULAR | Status: DC | PRN

## 2022-07-21 MED ORDER — POLYETHYLENE GLYCOL 3350 17 G PO PACK: 17.0000 g | PACK | Freq: Every day | ORAL | Status: DC | PRN

## 2022-07-21 MED ORDER — IOHEXOL 350 MG/ML SOLN
75.0000 mL | Freq: Once | INTRAVENOUS | Status: AC | PRN
  Administered 2022-07-21: 75 mL via INTRAVENOUS

## 2022-07-21 MED ORDER — DIAZEPAM 5 MG PO TABS
5.0000 mg | ORAL_TABLET | Freq: Once | ORAL | Status: AC
  Administered 2022-07-21: 5 mg via ORAL
  Filled 2022-07-21: qty 1

## 2022-07-21 MED ORDER — ACETAMINOPHEN 325 MG PO TABS: 650.0000 mg | ORAL_TABLET | Freq: Four times a day (QID) | ORAL | Status: DC | PRN

## 2022-07-21 MED ORDER — ONDANSETRON HCL 4 MG/2ML IJ SOLN
4.0000 mg | Freq: Once | INTRAMUSCULAR | Status: AC
  Administered 2022-07-21: 4 mg via INTRAVENOUS

## 2022-07-21 MED ORDER — ATORVASTATIN CALCIUM 40 MG PO TABS
40.0000 mg | ORAL_TABLET | Freq: Every day | ORAL | Status: DC
  Administered 2022-07-22: 40 mg via ORAL
  Filled 2022-07-21: qty 1

## 2022-07-21 MED ORDER — METOPROLOL SUCCINATE ER 100 MG PO TB24
100.0000 mg | ORAL_TABLET | Freq: Every day | ORAL | Status: DC
  Administered 2022-07-22: 100 mg via ORAL
  Filled 2022-07-21: qty 1

## 2022-07-21 MED ORDER — DOCUSATE SODIUM 100 MG PO CAPS
100.0000 mg | ORAL_CAPSULE | Freq: Two times a day (BID) | ORAL | Status: DC
  Administered 2022-07-21 – 2022-07-22 (×2): 100 mg via ORAL
  Filled 2022-07-21 (×2): qty 1

## 2022-07-21 MED ORDER — APIXABAN 5 MG PO TABS
5.0000 mg | ORAL_TABLET | Freq: Two times a day (BID) | ORAL | Status: DC
  Administered 2022-07-21 – 2022-07-22 (×2): 5 mg via ORAL
  Filled 2022-07-21 (×2): qty 1

## 2022-07-21 MED ORDER — PANTOPRAZOLE SODIUM 40 MG PO TBEC
40.0000 mg | DELAYED_RELEASE_TABLET | Freq: Every day | ORAL | Status: DC
  Administered 2022-07-22: 40 mg via ORAL
  Filled 2022-07-21: qty 1

## 2022-07-21 MED ORDER — HYDROCODONE-ACETAMINOPHEN 5-325 MG PO TABS
1.0000 | ORAL_TABLET | Freq: Four times a day (QID) | ORAL | Status: DC | PRN
  Administered 2022-07-21: 1 via ORAL
  Filled 2022-07-21: qty 1

## 2022-07-21 MED ORDER — ONDANSETRON HCL 4 MG/2ML IJ SOLN: 4.0000 mg | Freq: Once | INTRAMUSCULAR | Status: DC

## 2022-07-21 MED ORDER — ALPRAZOLAM 0.5 MG PO TABS
1.0000 mg | ORAL_TABLET | Freq: Three times a day (TID) | ORAL | Status: DC | PRN
  Administered 2022-07-21: 1 mg via ORAL
  Filled 2022-07-21: qty 2

## 2022-07-21 MED ORDER — FUROSEMIDE 40 MG PO TABS: 40.0000 mg | ORAL_TABLET | Freq: Every day | ORAL | Status: DC

## 2022-07-21 MED ORDER — ACETAMINOPHEN 650 MG RE SUPP: 650.0000 mg | Freq: Four times a day (QID) | RECTAL | Status: DC | PRN

## 2022-07-21 MED ORDER — SODIUM CHLORIDE 0.9% FLUSH
3.0000 mL | Freq: Two times a day (BID) | INTRAVENOUS | Status: DC
  Administered 2022-07-22: 3 mL via INTRAVENOUS

## 2022-07-21 MED ORDER — LACTATED RINGERS IV SOLN: INTRAVENOUS | Status: DC

## 2022-07-21 MED ORDER — FLUOXETINE HCL 20 MG PO CAPS
40.0000 mg | ORAL_CAPSULE | Freq: Every day | ORAL | Status: DC
  Filled 2022-07-21: qty 2

## 2022-07-21 MED ORDER — ONDANSETRON HCL 4 MG PO TABS: 4.0000 mg | ORAL_TABLET | Freq: Four times a day (QID) | ORAL | Status: DC | PRN

## 2022-07-21 MED ORDER — HYDRALAZINE HCL 20 MG/ML IJ SOLN: 5.0000 mg | INTRAMUSCULAR | Status: DC | PRN

## 2022-07-21 MED ORDER — BISACODYL 5 MG PO TBEC: 5.0000 mg | DELAYED_RELEASE_TABLET | Freq: Every day | ORAL | Status: DC | PRN

## 2022-07-21 MED ORDER — METHOCARBAMOL 500 MG PO TABS: 500.0000 mg | ORAL_TABLET | Freq: Three times a day (TID) | ORAL | Status: DC | PRN

## 2022-07-21 MED ORDER — MORPHINE SULFATE (PF) 2 MG/ML IV SOLN: 2.0000 mg | INTRAVENOUS | Status: DC | PRN

## 2022-07-21 MED ORDER — DILTIAZEM HCL ER COATED BEADS 120 MG PO CP24
120.0000 mg | ORAL_CAPSULE | Freq: Every day | ORAL | Status: DC
  Administered 2022-07-22: 120 mg via ORAL
  Filled 2022-07-21: qty 1

## 2022-07-21 NOTE — ED Notes (Signed)
Pt reports increased dizziness when attempting to stand and once standing for less than a minute felt like she was going to pass out. Pt is not orthostatic. Notified Knapp MD who said to let pt sit on the side of the bed for a little and reassess pt. Notified CN

## 2022-07-21 NOTE — ED Notes (Signed)
Pt transport to CT by TRN at this time

## 2022-07-21 NOTE — ED Notes (Signed)
Trauma Response Nurse Documentation  Kathy [name] is a 71 y.o. female arriving to [facility] ED via EMS  On Eliquis (apixaban) daily. Trauma was activated as a Level 2 based on the following trauma criteria Elderly patients > 65 with head trauma on anti-coagulation (excluding ASA). Trauma team at the bedside on patient arrival. Patient upgraded to a L1 after arrival due to hypotension 80/60.  Patient cleared for CT by Dr. Salomon Fick. Pt transported to CT with trauma response nurse present to monitor. RN remained with the patient throughout their absence from the department for clinical observation.   GCS 15.  History   Past Medical History:  Diagnosis Date   Adenomatous colon polyp    2012   Anxiety and depression    Arthritis    Osteoarthritis, Dr Maureen Ralphs   Cancer [facility])    skin - left foot excised   Chronic diastolic CHF (congestive heart failure) (HCC)    DDD (degenerative disc disease), cervical    Deaf, right    DVT (deep venous thrombosis) ([place])    RLE DVT 08/2004, post right knee arthroscopy   Fibromyalgia    Dr Tobie Lords, Rheu   GERD (gastroesophageal reflux disease)    H/O hiatal hernia    Headache(784.0)    PAST HX MILD MIGRAINES - resolved per patient 08/18/20   History of kidney stones    passed stones   HTN (hypertension)    IBS (irritable bowel syndrome)    Internal hemorrhoid    Lumbar radiculopathy    Obesity    OSA on CPAP    Moderate with AHI 20/hr with nocturnal hypoxemia on CPAP   PAF (paroxysmal atrial fibrillation) ([place]) 06/2018   Pulmonary hypertension ([place])      Past Surgical History:  Procedure Laterality Date   ABDOMINAL HYSTERECTOMY     dysfunctional menses   APPENDECTOMY     BREAST EXCISIONAL BIOPSY Right    CARDIAC CATHETERIZATION  06/26/2018   CHOLECYSTECTOMY     COLONOSCOPY  05/27/2002, 08/08/11   2003 diverticulosis, hemorrhoids 2012 same + cecal polyp   EYE SURGERY     KNEE ARTHROSCOPY  2008   right   KYPHOPLASTY  Bilateral 08/19/2020   Procedure: KYPHOPLASTY LUMBAR ONE;  Surgeon: Vallarie Mare, MD;  Location: [place];  Service: Neurosurgery;  Laterality: Bilateral;  posterior   LEFT HEART CATH AND CORONARY ANGIOGRAPHY N/A 06/26/2018   Procedure: LEFT HEART CATH AND CORONARY ANGIOGRAPHY;  Surgeon: Wellington Hampshire, MD;  Location: [place] [facility];  Service: Cardiovascular;  Laterality: N/A;   MELANOMA EXCISION Left 07/2013   foot   TOTAL HIP ARTHROPLASTY  02/2010   left ; Dr Maureen Ralphs   TOTAL HIP ARTHROPLASTY  03/18/2012   Procedure: TOTAL HIP ARTHROPLASTY;  Surgeon: Gearlean Alf, MD;  [facility];  Service: Orthopedics;  Laterality: Right;   UPPER GASTROINTESTINAL ENDOSCOPY  04/21/2009, 08/08/11   2010 gastritis ;2012 small hiatal hernia. Dr Carlean Purl   WRIST FRACTURE SURGERY Left      Initial Focused Assessment (If applicable, or please see trauma documentation): Alert and Oriented, GCS 15, PERR Experiencing some dizziness, does not believe she felt this way prior to fall and believes fall was mechanical in nature Does say her doctor has changed some of her BP meds recently, now just taking metoprolol A/B intact  CT's Completed:   CT Head, CT C-Spine, CT Chest w/ contrast, and CT abdomen/pelvis w/ contrast   Interventions:  IV, labs CXR/PXR CT Head/Cspine/C/A/P  500cc NS, imporvement in BP  Plan for disposition:  Admission to floor - was to d/c home but now will be admitted due to dizziness when standing  Event Summary: Patient here after a fall on eliquis, now experiencing some dizziness and hypotension. CTs negative for any injury. Will be admitted for medical workup/observation.  Bedside handoff with ED RN Raquel Sarna.    [place] Pope Nickia Boesen  Trauma Response RN  Please call TRN at [phone] for further assistance.

## 2022-07-21 NOTE — H&P (Signed)
History and Physical    Patient: Kathy [name] RWE:[id number] DOB: Oct 18, 1950 DOA: 07/21/2022 DOS: the patient was seen and examined on 07/21/2022 PCP: Isaac Bliss, Rayford Halsted, MD  Patient coming from: Home - lives alone; NOK:    Chief Complaint: Dizziness  HPI: ZULEYMA SCHARF is a 71 y.o. female with medical history significant of anxiety/depression; chronic diastolic CHF; HTN; morbid obesity; OSA on CPAP; and afib presenting with dizziness.   She was started on Neurontin for neuropathy about 10 days ago and has been moving slowly recently.  She was making her bed this AM and felt very good this AM.  She ended up falling and hitting her head on the nightstand.  She has no idea why she fell - didn't trip unless her bedroom shoe caught on something.  She did not lose consciousness.  She has been feeling dizzy in the ER and this afternoon her chest felt heavy with her usual afib.  She has felt nauseated.  She does not usually wear O2.   She lost her husband in the last year but has been managing ok independently.    ER Course:   Post-concussive?  Dizzy with standing/walking.  Felt fine and was making bed and lost balance, on  AC.  No persistent hypotension.  Extensive eval without worrisome findings.  Still very dizzy with standing.  No LOC.     Review of Systems: As mentioned in the history of present illness. All other systems reviewed and are negative. Past Medical History:  Diagnosis Date   Adenomatous colon polyp    2012   Anxiety and depression    Arthritis    Osteoarthritis, Dr Maureen Ralphs   Cancer [facility])    skin - left foot excised   Chronic diastolic CHF (congestive heart failure) (HCC)    DDD (degenerative disc disease), cervical    Deaf, right    DVT (deep venous thrombosis) ([place])    RLE DVT 08/2004, post right knee arthroscopy   Fibromyalgia    Dr Tobie Lords, Rheu   GERD (gastroesophageal reflux disease)    H/O hiatal hernia    Headache(784.0)    PAST HX  MILD MIGRAINES - resolved per patient 08/18/20   History of kidney stones    passed stones   HTN (hypertension)    IBS (irritable bowel syndrome)    Internal hemorrhoid    Lumbar radiculopathy    Obesity    OSA on CPAP    Moderate with AHI 20/hr with nocturnal hypoxemia on CPAP   PAF (paroxysmal atrial fibrillation) ([place]) 06/2018   Pulmonary hypertension ([place])    Past Surgical History:  Procedure Laterality Date   ABDOMINAL HYSTERECTOMY     dysfunctional menses   APPENDECTOMY     BREAST EXCISIONAL BIOPSY Right    CARDIAC CATHETERIZATION  06/26/2018   CHOLECYSTECTOMY     COLONOSCOPY  05/27/2002, 08/08/11   2003 diverticulosis, hemorrhoids 2012 same + cecal polyp   EYE SURGERY     KNEE ARTHROSCOPY  2008   right   KYPHOPLASTY Bilateral 08/19/2020   Procedure: KYPHOPLASTY LUMBAR ONE;  Surgeon: Vallarie Mare, MD;  Location: [facility];  Service: Neurosurgery;  Laterality: Bilateral;  posterior   LEFT HEART CATH AND CORONARY ANGIOGRAPHY N/A 06/26/2018   Procedure: LEFT HEART CATH AND CORONARY ANGIOGRAPHY;  Surgeon: Wellington Hampshire, MD;  Location: [facility];  Service: Cardiovascular;  Laterality: N/A;   MELANOMA EXCISION Left 07/2013   foot   TOTAL HIP ARTHROPLASTY  02/2010   left ; Dr Maureen Ralphs   TOTAL HIP ARTHROPLASTY  03/18/2012   Procedure: TOTAL HIP ARTHROPLASTY;  Surgeon: Gearlean Alf, MD;  Location: [facility];  Service: Orthopedics;  Laterality: Right;   UPPER GASTROINTESTINAL ENDOSCOPY  04/21/2009, 08/08/11   2010 gastritis ;2012 small hiatal hernia. Dr Carlean Purl   WRIST FRACTURE SURGERY Left    Social History:  reports that she has never smoked. She has never used smokeless tobacco. She reports current alcohol use. She reports that she does not use drugs.  Allergies  Allergen Reactions   Achromycin [Tetracycline Hcl] Shortness Of Breath    Dyspnea     Penicillins     Facial angioedema Did it involve swelling of the face/tongue/throat, SOB, or low BP? Yes Did it  involve sudden or severe rash/hives, skin peeling, or any reaction on the inside of your mouth or nose? No Did you need to seek medical attention at a hospital or doctor's office? No When did it last happen?      30+ years If all above answers are "NO", may proceed with cephalosporin use.    Sulfamethoxazole-Trimethoprim Hives   Nickel Itching   Zanaflex [Tizanidine Hydrochloride]     Causes more pain     Family History  Problem Relation Age of Onset   Heart attack Mother 67   COPD Mother    Breast cancer Mother 56   Hypertension Father    Diabetes Father    Heart failure Father 62   Breast cancer Paternal Aunt        cns mets   COPD Maternal Grandmother    Heart disease Paternal Grandmother        died 8   Diabetes Paternal Grandmother    Stroke Paternal Grandfather        in late 79s   Heart attack Brother 89   Colon cancer Neg Hx     Prior to Admission medications   Medication Sig Start Date End Date Taking? Authorizing Provider  ALPRAZolam [name]) 1 MG tablet TAKE 1 TABLET BY MOUTH THREE TIMES A DAY AS NEEDED FOR ANXIETY 06/06/22   Isaac Bliss, Rayford Halsted, MD  apixaban (ELIQUIS) 5 MG TABS tablet Take 1 tablet (5 mg total) by mouth 2 (two) times daily. 05/09/22   Isaac Bliss, Rayford Halsted, MD  atorvastatin (LIPITOR) 40 MG tablet Take 1 tablet (40 mg total) by mouth daily. 12/05/21   Nahser, Wonda Cheng, MD  Calcium Carbonate (CALCIUM 600 PO) Take 1 tablet by mouth daily.    [provider]  clindamycin (CLEOCIN T) 1 % lotion Apply 1 application topically 2 (two) times daily as needed (rosacea (skin irritation)).     [provider]  clobetasol (TEMOVATE) 0.05 % external solution Apply 1 application topically daily as needed (rosacea).    [provider]  diltiazem (CARDIZEM CD) 120 MG 24 hr capsule Take 1 capsule (120 mg total) by mouth daily. 12/05/21   Nahser, Wonda Cheng, MD  fluocinonide (LIDEX) 0.05 % external solution Apply 1 application  topically daily as needed (rosacea).    [provider]  FLUoxetine (PROZAC) 40 MG capsule TAKE 1 CAPSULE BY MOUTH EVERY DAY 07/10/22   Isaac Bliss, Rayford Halsted, MD  furosemide (LASIX) 40 MG tablet TAKE 1 TABLET BY MOUTH DAILY. PLEASE KEEP UPCOMING APPOINTMENT IN JUNE 2023 FOR FUTURE REFILLS. 03/28/22   Elgie Collard, PA-C  HYDROcodone-acetaminophen (NORCO/VICODIN) 5-325 MG tablet Take 1 tablet by mouth every 6 (six) hours as needed  for moderate pain. 06/06/22   Isaac Bliss, Rayford Halsted, MD  methocarbamol (ROBAXIN) 500 MG tablet Take 500 mg by mouth every 8 (eight) hours as needed. 05/26/22   [provider]  metoprolol succinate (TOPROL-XL) 100 MG 24 hr tablet TAKE 1 TABLET BY MOUTH EVERY DAY 05/05/22   Nahser, Wonda Cheng, MD  metroNIDAZOLE (METROCREAM) 0.75 % cream Apply 1 application topically 2 (two) times daily as needed (rosacea).     [provider]  Multiple Vitamin (MULTIVITAMIN WITH MINERALS) TABS tablet Take 1 tablet by mouth daily. Centrum Silver    [provider]  omeprazole (PRILOSEC) 40 MG capsule TAKE 1 CAPSULE BY MOUTH EVERY DAY 07/10/22   Isaac Bliss, Rayford Halsted, MD  potassium chloride (KLOR-CON) 10 MEQ tablet Take 1 tablet (10 mEq total) by mouth daily. 12/05/21   Nahser, Wonda Cheng, MD    Physical Exam: Vitals:   07/21/22 1716 07/21/22 1718 07/21/22 1719 07/21/22 1720  BP:      Pulse: 89 81 85 84  Resp: (!) '22 17 20 18  '$ Temp:      TempSrc:      SpO2: (!) 85% 93% 96% 97%  Weight:      Height:       General:  Appears calm and comfortable and is in NAD, very conversant Eyes:  PERRL, EOMI, normal lids, iris ENT:  grossly normal hearing, lips & tongue, mmm; appropriate dentition Neck:  no LAD, masses or thyromegaly Cardiovascular:  RRR, no m/r/g. No LE edema.  Respiratory:   CTA bilaterally with no wheezes/rales/rhonchi.  Normal respiratory effort.  On [place] O2 Abdomen:  soft, mildly diffusely tender, ND Skin:  no rash or induration seen  on limited exam Musculoskeletal:  grossly normal tone BUE/BLE, good ROM, no bony abnormality Psychiatric:  grossly normal mood and affect, speech fluent and appropriate, AOx3 Neurologic:  CN 2-12 grossly intact, moves all extremities in coordinated fashion   Radiological Exams on Admission: Independently reviewed - see discussion in A/P where applicable  CT L-SPINE NO CHARGE  Result Date: 07/21/2022 CLINICAL DATA:  Provided history: Level 1 trauma. Fall on blood thinners. EXAM: CT LUMBAR SPINE WITHOUT CONTRAST TECHNIQUE: Multidetector CT imaging of the lumbar spine was performed without intravenous contrast administration. Multiplanar CT image reconstructions were also generated. RADIATION DOSE REDUCTION: This exam was performed according to the departmental dose-optimization program which includes automated exposure control, adjustment of the mA and/or kV according to patient size and/or use of iterative reconstruction technique. COMPARISON:  Lumbar spine radiographs 02/01/2021. FINDINGS: Segmentation: 5 lumbar vertebrae. The caudal most well-formed interval disc space is designated L5-S1. Alignment: 5 mm bony retropulsion at the level of the L1 superior endplate, similar the prior lumbar spine radiographs of 02/01/2021. Vertebrae: Chronic L1 vertebral compression fracture status post vertebral augmentation. No appreciable progressive height loss as compared to the prior lumbar spine radiographs of 02/01/2021. Subtle chronic T12 anterior wedge vertebral compression deformity, also unchanged from this prior exam. No evidence of acute fracture to the lumbar spine. Paraspinal and other soft tissues: No paraspinal hematoma, mass or collection. Please refer to the same day CT chest/abdomen/pelvis for a description of abdominopelvic soft tissue findings. Disc levels: Disc space narrowing is moderate at T12-L1 and mild-to-moderate at L1-L2. No more than mild disc space narrowing elsewhere within the lumbar  spine. At T12-L1, bony retropulsion at the level of the L1 superior endplate mildly narrows the spinal canal. Within the lumbar spine, there are multilevel disc bulges. Facet arthrosis, greatest at  L3-L4, L4-L5 and L5-S1. Minimal endplate spurring at P5-F1. No appreciable high-grade spinal canal stenosis. Multilevel neural foraminal narrowing, greatest on the left at L5-S1 (moderate at this site). IMPRESSION: No evidence of acute fracture to the lumbar spine. Chronic L1 vertebral compression fracture status post vertebral augmentation. No appreciable progressive height loss as compared to the prior lumbar spine radiographs of 02/01/2021. Bony retropulsion at the level of the L1 superior endplate mildly narrows the spinal canal. Subtle chronic T12 anterior wedge vertebral compression deformity, also unchanged from the prior lumbar spine radiographs of 02/01/2021. Lumbar spondylosis, as described. Electronically Signed   By: Kellie Simmering D.O.   On: 07/21/2022 15:08   CT CHEST ABDOMEN PELVIS W CONTRAST  Result Date: 07/21/2022 CLINICAL DATA:  Provided history: Poly trauma, blunt. Additional history provided: Fall, on blood thinners. EXAM: CT CHEST, ABDOMEN, AND PELVIS WITH CONTRAST TECHNIQUE: Multidetector CT imaging of the chest, abdomen and pelvis was performed following the standard protocol during bolus administration of intravenous contrast. RADIATION DOSE REDUCTION: This exam was performed according to the departmental dose-optimization program which includes automated exposure control, adjustment of the mA and/or kV according to patient size and/or use of iterative reconstruction technique. CONTRAST:  60m OMNIPAQUE IOHEXOL 350 MG/ML SOLN COMPARISON:  CT angiogram chest 12/02/2020. CT chest/abdomen/pelvis 06/25/2018. Radiographs of the lumbar spine 02/01/2021. FINDINGS: CARDIOVASCULAR: Cardiomegaly. Calcified atherosclerotic plaque within the coronary arteries. Scattered atherosclerotic plaque within the  thoracic aorta. No evidence of acute traumatic vascular injury. MEDIASTINUM/NODES: No mediastinal hematoma.No lymphadenopathy. 3 mm nodule within the right thyroid lobe, not meeting consensus criteria for ultrasound follow-up based on size. No follow-up imaging is recommended. LUNGS/PLEURA: No airspace consolidation. No pleural fluid or pneumothorax. The central airways are grossly patent. MUSCULOSKELETAL: No acute fracture is identified. Multilevel bridging ventrolateral osteophytes within the thoracic spine with relative preservation of the intervertebral disc spaces. 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 HEPATOBILIARY: Hepatic steatosis. No focal hepatic lesion is identified. Prior cholecystectomy.No intra or extrahepatic biliary ductal dilatation. PANCREAS: No focal lesion.No ductal dilatation or peripancreatic fluid. SPLEEN:Normal in size without focal lesion. Incidentally noted splenule at the splenic hilum. ADRENALS/URINARY TRACT: No adrenal gland hemorrhage or mass. The kidneys are normal in size. Small focus of cortical scarring within the posterior aspect of the interpolar right kidney. Redemonstrated 2.6 cm simple fluid attenuation cyst arising from the posterior aspect of the interpolar left kidney. No follow-up imaging is recommended. No evidence of acute traumatic renal injury. No hydronephrosis or renal calculus. Unremarkable appearance of the ureters. Unremarkable appearance of the bladder given the degree of distension. STOMACH/BOWEL: The stomach is unremarkable. No evidence of acute traumatic bowel injury. No bowel wall thickening, bowel dilatation or evidence of inflammation. Sigmoid diverticulosis without CT evidence of diverticulitis. VASCULAR/LYMPHATIC: Streak and beam hardening artifact arising from bilateral hip prostheses limits evaluation of the pelvic contents. Within this limitation, there is no appreciable a abdominopelvic lymphadenopathy. No evidence of acute  traumatic injury to the abdominal aorta. Atherosclerotic plaque within the abdominal aorta and bilateral iliac arteries. REPRODUCTIVE: Not well evaluated due to streak and beam hardening artifact arising from bilateral hip prostheses. OTHER: No appreciable abdominopelvic ascites or mesenteric hematoma. MUSCULOSKELETAL: Prominent streak and beam hardening artifact arising from bilateral hip prostheses, partially obscuring regional osseous structures. No evidence of acute fracture Chronic L1 vertebral compression fracture status post vertebral augmentation. No progressive height loss as compared to the prior lumbar spine radiographs of 02/01/2021. Please refer to the separately reported lumbar spine CT for additional lumbar spine  findings. IMPRESSION: 1. Streak and beam hardening artifact arising from bilateral hip prostheses partially obscures regional osseous structures, and obscures portions of the pelvic contents. 2. Within this limitation, there is no evidence of acute traumatic injury to the chest, abdomen or pelvis. 3. Cardiomegaly. 4. Coronary artery atherosclerosis. 5.  Aortic Atherosclerosis (ICD10-I70.0). 6. Hepatic steatosis. 7. Redemonstrated small focus of cortical scarring within the posterior aspect of the interpolar right kidney. 8. Diverticulosis without CT evidence of diverticulitis. 9. Chronic L1 vertebral compression fracture status post vertebral augmentation. No progressive height loss as compared to the prior lumbar spine radiographs of 02/01/2021. 10. 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 Electronically Signed   By: Kellie Simmering D.O.   On: 07/21/2022 14:54   CT HEAD WO CONTRAST (5MM)  Result Date: 07/21/2022 CLINICAL DATA:  Provided history: Head trauma, minor. Polytrauma, blunt. Additional history provided: Fall. EXAM: CT HEAD WITHOUT CONTRAST CT CERVICAL SPINE WITHOUT CONTRAST TECHNIQUE: Multidetector CT imaging of the head and cervical spine was  performed following the standard protocol without intravenous contrast. Multiplanar CT image reconstructions of the cervical spine were also generated. RADIATION DOSE REDUCTION: This exam was performed according to the departmental dose-optimization program which includes automated exposure control, adjustment of the mA and/or kV according to patient size and/or use of iterative reconstruction technique. COMPARISON:  Head CT 04/26/2021. Cervical spine CT 04/26/2021. FINDINGS: CT HEAD FINDINGS Brain: No age advanced or lobar predominant parenchymal atrophy. Partially empty sella turcica. There is no acute intracranial hemorrhage. No demarcated cortical infarct. No extra-axial fluid collection. No evidence of an intracranial mass. No midline shift. Vascular: No hyperdense vessel. Atherosclerotic calcifications. Skull: No fracture or aggressive osseous lesion. Sinuses/Orbits: No mass or acute finding within the imaged orbits. No significant paranasal sinus disease. CT CERVICAL SPINE FINDINGS Alignment: Straightening of the expected cervical lordosis. No significant spondylolisthesis. Skull base and vertebrae: The basion-dental and atlanto-dental intervals are maintained.No evidence of acute fracture to the cervical spine. Soft tissues and spinal canal: No prevertebral fluid or swelling. No visible canal hematoma. Disc levels: Cervical spondylosis with multilevel disc space narrowing, disc bulges/central disc protrusions, posterior disc osteophyte complexes, endplate spurring and uncovertebral hypertrophy. Disc space narrowing is greatest at C4-C5, C5-C6 and C6-C7 (moderate to advanced at these levels). No appreciable high-grade spinal canal stenosis. Multilevel bony neural foraminal narrowing. Upper chest: No consolidation within the imaged lung apices. No visible pneumothorax. IMPRESSION: CT head: No evidence of acute intracranial abnormality. CT cervical spine: 1. No evidence of acute fracture to the cervical spine.  2. Nonspecific straightening of the expected cervical lordosis. 3. Cervical spondylosis, as described. Electronically Signed   By: Kellie Simmering D.O.   On: 07/21/2022 14:18   CT Cervical Spine Wo Contrast  Result Date: 07/21/2022 CLINICAL DATA:  Provided history: Head trauma, minor. Polytrauma, blunt. Additional history provided: Fall. EXAM: CT HEAD WITHOUT CONTRAST CT CERVICAL SPINE WITHOUT CONTRAST TECHNIQUE: Multidetector CT imaging of the head and cervical spine was performed following the standard protocol without intravenous contrast. Multiplanar CT image reconstructions of the cervical spine were also generated. RADIATION DOSE REDUCTION: This exam was performed according to the departmental dose-optimization program which includes automated exposure control, adjustment of the mA and/or kV according to patient size and/or use of iterative reconstruction technique. COMPARISON:  Head CT 04/26/2021. Cervical spine CT 04/26/2021. FINDINGS: CT HEAD FINDINGS Brain: No age advanced or lobar predominant parenchymal atrophy. Partially empty sella turcica. There is no acute intracranial hemorrhage. No demarcated cortical infarct. No extra-axial fluid  collection. No evidence of an intracranial mass. No midline shift. Vascular: No hyperdense vessel. Atherosclerotic calcifications. Skull: No fracture or aggressive osseous lesion. Sinuses/Orbits: No mass or acute finding within the imaged orbits. No significant paranasal sinus disease. CT CERVICAL SPINE FINDINGS Alignment: Straightening of the expected cervical lordosis. No significant spondylolisthesis. Skull base and vertebrae: The basion-dental and atlanto-dental intervals are maintained.No evidence of acute fracture to the cervical spine. Soft tissues and spinal canal: No prevertebral fluid or swelling. No visible canal hematoma. Disc levels: Cervical spondylosis with multilevel disc space narrowing, disc bulges/central disc protrusions, posterior disc osteophyte  complexes, endplate spurring and uncovertebral hypertrophy. Disc space narrowing is greatest at C4-C5, C5-C6 and C6-C7 (moderate to advanced at these levels). No appreciable high-grade spinal canal stenosis. Multilevel bony neural foraminal narrowing. Upper chest: No consolidation within the imaged lung apices. No visible pneumothorax. IMPRESSION: CT head: No evidence of acute intracranial abnormality. CT cervical spine: 1. No evidence of acute fracture to the cervical spine. 2. Nonspecific straightening of the expected cervical lordosis. 3. Cervical spondylosis, as described. Electronically Signed   By: Kellie Simmering D.O.   On: 07/21/2022 14:18   DG Pelvis Portable  Result Date: 07/21/2022 CLINICAL DATA:  Fall. EXAM: PORTABLE PELVIS 1-2 VIEWS COMPARISON:  April 26, 2021. FINDINGS: There is no evidence of pelvic fracture or diastasis. No pelvic bone lesions are seen. Status post bilateral total hip arthroplasties. IMPRESSION: No acute abnormality is noted. Electronically Signed   By: Marijo Conception M.D.   On: 07/21/2022 13:48   DG Chest Portable 1 View  Result Date: 07/21/2022 CLINICAL DATA:  Female presents following fall. EXAM: PORTABLE CHEST 1 VIEW COMPARISON:  September 06, 2021. FINDINGS: EKG leads project over the chest. Trachea midline. Cardiomediastinal contours and hilar structures with signs of moderate cardiac enlargement similar to prior imaging accounting for AP projection. Lungs are clear.  No sign of pneumothorax. On limited assessment no acute skeletal process. IMPRESSION: Moderate cardiac enlargement. No acute cardiopulmonary disease. Electronically Signed   By: Zetta Bills M.D.   On: 07/21/2022 13:47    EKG: Independently reviewed.  NSR with rate 77; low voltage; IVCD;  nonspecific ST changes with no evidence of acute ischemia   Labs on Admission: I have personally reviewed the available labs and imaging studies at the time of the admission.  Pertinent labs:    Glucose 120 BUN  15/Creatinine 1.15/GFR 51 - stable Normal CBC INR 1.5   Assessment and Plan: Principal Problem:   Dizziness Active Problems:   Morbid obesity (HCC)   Essential hypertension   Anxiety and depression   Persistent atrial fibrillation (HCC)   Chronic diastolic CHF (congestive heart failure) (HCC)   OSA on CPAP   Chronic pain disorder    Dizziness -Patient with a fall and head trauma at home -Persistent and progressive dizziness with standing in the ER -Likely post-concussive -Will observe overnight on telemetry -Vestibular PT/OT evaluations -Will give Valium 5 mg PO x 1 tonight at bedtime  Chronic diastolic CHF -9735 echo with preserved EF and pseudonormalization of diastolic parameters -She appears to be stable at this time -Continue Lasix  Depression/anxiety -Also with grief after losing her husband of 48 years within the last year -Continue prn alprazolam -Continue fluoxetine  HTN -Continue diltiazem, Toprol XL  OSA -Continue CPAP  Afib -Rate controlled with Toprol XL and diltiazem -Continue Eliquis  Chronic pain -I have reviewed this patient in the [place] Controlled Substances Reporting System.  She is receiving medications from  two providers but appears to be taking them as prescribed. -She is at particularly high risk of opioid misuse, diversion, or overdose.  -Continue Norco and Robaxin, add morphine for breakthrough pain given the current traumatic situation  Morbid obesity -Body mass index is 46.27 kg/m..  -Weight loss should be encouraged -Outpatient PCP/bariatric medicine f/u encouraged  -Her lifestyle appears to be pretty sedentary and she is likely to benefit from becoming more active    Advance Care Planning:   Code Status: Full Code   Consults: PT/OT; nutrition; TOC team  DVT Prophylaxis: Eliquis  Family Communication: None present; she declined to have me call family at the time of admission  Severity of Illness: The appropriate patient  status for this patient is OBSERVATION. Observation status is judged to be reasonable and necessary in order to provide the required intensity of service to ensure the patient's safety. The patient's presenting symptoms, physical exam findings, and initial radiographic and laboratory data in the context of their medical condition is felt to place them at decreased risk for further clinical deterioration. Furthermore, it is anticipated that the patient will be medically stable for discharge from the hospital within 2 midnights of admission.   Author: Karmen Bongo, MD 07/21/2022 6:55 PM  For on call review [email or web address].

## 2022-07-21 NOTE — ED Provider Notes (Signed)
[facility] EMERGENCY DEPARTMENT Provider Note   CSN: [id number] Arrival date & time: 07/21/22  1311     History  Chief Complaint  Patient presents with   Level 1 Trauma Fall on Thinners    Kathy [name] is a 71 y.o. female.  HPI Patient presented as a level 2 trauma.  Presents after a fall.  Was putting the sheets on her bed and lost her balance and fell striking the back of her head on the bedside table.  No loss conscious.  But has pain in her head and in her midline.  Does have a history of chronic back pain.  No loss conscious.  No dizziness.  No numbness or weakness.  Is a level 2 trauma due to hitting head on anticoagulation.   Past Medical History:  Diagnosis Date   Adenomatous colon polyp    2012   Anxiety    Anxiety and depression    Arthritis    Osteoarthritis, Dr Maureen Ralphs   Cancer [facility])    skin - left foot excised   Chronic diastolic CHF (congestive heart failure) (HCC)    DDD (degenerative disc disease), cervical    Deaf, right    Depression    Diverticulosis    DVT (deep venous thrombosis) ([place])    RLE DVT 08/2004, post right knee arthroscopy   Dyspnea    occasional- no oxygen   Dysrhythmia    a-fib   Fibromyalgia    Dr Tobie Lords, Rheu   GERD (gastroesophageal reflux disease)    H/O hiatal hernia    Headache(784.0)    PAST HX MILD MIGRAINES - resolved per patient 08/18/20   Heart murmur    mitral valve prolapse--no symptoms   History of colonic polyps 08/08/2011        History of kidney stones    passed stones   HTN (hypertension)    IBS (irritable bowel syndrome)    Internal hemorrhoid    Lumbar radiculopathy    Obesity    OSA on CPAP    Moderate with AHI 20/hr with nocturnal hypoxemia on CPAP   PAF (paroxysmal atrial fibrillation) ([place]) 06/2018   Pulmonary hypertension ([place])    Syncope    idiopathic, ? related to migraines or hyperventilation. Dr Leonie Man, Neurology, YEARS AGO; 02/03/20 in setting of afib with RVR     Home Medications Prior to Admission medications   Medication Sig Start Date End Date Taking? Authorizing Provider  ALPRAZolam [name]) 1 MG tablet TAKE 1 TABLET BY MOUTH THREE TIMES A DAY AS NEEDED FOR ANXIETY 06/06/22   Isaac Bliss, Rayford Halsted, MD  apixaban (ELIQUIS) 5 MG TABS tablet Take 1 tablet (5 mg total) by mouth 2 (two) times daily. 05/09/22   Isaac Bliss, Rayford Halsted, MD  atorvastatin (LIPITOR) 40 MG tablet Take 1 tablet (40 mg total) by mouth daily. 12/05/21   Nahser, Wonda Cheng, MD  Calcium Carbonate (CALCIUM 600 PO) Take 1 tablet by mouth daily.    [provider]  clindamycin (CLEOCIN T) 1 % lotion Apply 1 application topically 2 (two) times daily as needed (rosacea (skin irritation)).     [provider]  clobetasol (TEMOVATE) 0.05 % external solution Apply 1 application topically daily as needed (rosacea).    [provider]  diltiazem (CARDIZEM CD) 120 MG 24 hr capsule Take 1 capsule (120 mg total) by mouth daily. 12/05/21   Nahser, Wonda Cheng, MD  fluocinonide (LIDEX) 0.05 % external solution Apply 1 application  topically daily as needed (rosacea).    [provider]  FLUoxetine (PROZAC) 40 MG capsule TAKE 1 CAPSULE BY MOUTH EVERY DAY 07/10/22   Isaac Bliss, Rayford Halsted, MD  furosemide (LASIX) 40 MG tablet TAKE 1 TABLET BY MOUTH DAILY. PLEASE KEEP UPCOMING APPOINTMENT IN JUNE 2023 FOR FUTURE REFILLS. 03/28/22   Elgie Collard, PA-C  HYDROcodone-acetaminophen (NORCO/VICODIN) 5-325 MG tablet Take 1 tablet by mouth every 6 (six) hours as needed for moderate pain. 06/06/22   Isaac Bliss, Rayford Halsted, MD  methocarbamol (ROBAXIN) 500 MG tablet Take 500 mg by mouth every 8 (eight) hours as needed. 05/26/22   [provider]  metoprolol succinate (TOPROL-XL) 100 MG 24 hr tablet TAKE 1 TABLET BY MOUTH EVERY DAY 05/05/22   Nahser, Wonda Cheng, MD  metroNIDAZOLE (METROCREAM) 0.75 % cream Apply 1 application topically 2 (two) times daily as needed  (rosacea).     [provider]  Multiple Vitamin (MULTIVITAMIN WITH MINERALS) TABS tablet Take 1 tablet by mouth daily. Centrum Silver    [provider]  omeprazole (PRILOSEC) 40 MG capsule TAKE 1 CAPSULE BY MOUTH EVERY DAY 07/10/22   Isaac Bliss, Rayford Halsted, MD  potassium chloride (KLOR-CON) 10 MEQ tablet Take 1 tablet (10 mEq total) by mouth daily. 12/05/21   Nahser, Wonda Cheng, MD      Allergies    Achromycin [tetracycline hcl], Penicillins, Sulfamethoxazole-trimethoprim, Nickel, and Zanaflex [tizanidine hydrochloride]    Review of Systems   Review of Systems  Physical Exam Updated Vital Signs BP 126/77 (BP Location: Right Arm)   Pulse 73   Temp (!) 97.5 F (36.4 C) (Oral)   Resp 14   Ht 5' 6.5" (1.689 m)   Wt 132 kg   SpO2 97%   BMI 46.27 kg/m  Physical Exam Vitals and nursing note reviewed.  Constitutional:      Appearance: She is obese.  HENT:     Head:     Comments: Mild tenderness to occipital area without laceration. Eyes:     Extraocular Movements: Extraocular movements intact.  Cardiovascular:     Rate and Rhythm: Normal rate. Rhythm irregular.  Abdominal:     Tenderness: There is no abdominal tenderness.  Musculoskeletal:     Cervical back: No tenderness.     Comments: Lumbar and thoracic spine tenderness without step-off or deformity.  No extremity tenderness.  Skin:    Capillary Refill: Capillary refill takes less than 2 seconds.  Neurological:     Mental Status: She is alert and oriented to person, place, and time.     ED Results / Procedures / Treatments   Labs (all labs ordered are listed, but only abnormal results are displayed) Labs Reviewed  COMPREHENSIVE METABOLIC PANEL - Abnormal; Notable for the following components:      Result Value   Glucose, Bld 120 (*)    Creatinine, Ser 1.15 (*)    Total Protein 6.4 (*)    GFR, Estimated 51 (*)    All other components within normal limits  PROTIME-INR - Abnormal; Notable for the  following components:   Prothrombin Time 17.9 (*)    INR 1.5 (*)    All other components within normal limits  I-STAT CHEM 8, ED - Abnormal; Notable for the following components:   Creatinine, Ser 1.10 (*)    Glucose, Bld 117 (*)    Calcium, Ion 1.09 (*)    All other components within normal limits  CBC WITH DIFFERENTIAL/PLATELET    EKG EKG  Interpretation  Date/Time:  Friday July 21 2022 14:13:36 EDT Ventricular Rate:  77 PR Interval:  264 QRS Duration: 116 QT Interval:  452 QTC Calculation: 512 R Axis:   79 Text Interpretation: Atrial fibrillation Nonspecific intraventricular conduction delay Low voltage, precordial leads Nonspecific T abnrm, anterolateral leads Confirmed by Davonna Belling [phone]) on 07/21/2022 2:29:36 PM  Radiology CT L-SPINE NO CHARGE  Result Date: 07/21/2022 CLINICAL DATA:  Provided history: Level 1 trauma. Fall on blood thinners. EXAM: CT LUMBAR SPINE WITHOUT CONTRAST TECHNIQUE: Multidetector CT imaging of the lumbar spine was performed without intravenous contrast administration. Multiplanar CT image reconstructions were also generated. RADIATION DOSE REDUCTION: This exam was performed according to the departmental dose-optimization program which includes automated exposure control, adjustment of the mA and/or kV according to patient size and/or use of iterative reconstruction technique. COMPARISON:  Lumbar spine radiographs 02/01/2021. FINDINGS: Segmentation: 5 lumbar vertebrae. The caudal most well-formed interval disc space is designated L5-S1. Alignment: 5 mm bony retropulsion at the level of the L1 superior endplate, similar the prior lumbar spine radiographs of 02/01/2021. Vertebrae: Chronic L1 vertebral compression fracture status post vertebral augmentation. No appreciable progressive height loss as compared to the prior lumbar spine radiographs of 02/01/2021. Subtle chronic T12 anterior wedge vertebral compression deformity, also unchanged from this  prior exam. No evidence of acute fracture to the lumbar spine. Paraspinal and other soft tissues: No paraspinal hematoma, mass or collection. Please refer to the same day CT chest/abdomen/pelvis for a description of abdominopelvic soft tissue findings. Disc levels: Disc space narrowing is moderate at T12-L1 and mild-to-moderate at L1-L2. No more than mild disc space narrowing elsewhere within the lumbar spine. At T12-L1, bony retropulsion at the level of the L1 superior endplate mildly narrows the spinal canal. Within the lumbar spine, there are multilevel disc bulges. Facet arthrosis, greatest at L3-L4, L4-L5 and L5-S1. Minimal endplate spurring at P3-X9. No appreciable high-grade spinal canal stenosis. Multilevel neural foraminal narrowing, greatest on the left at L5-S1 (moderate at this site). IMPRESSION: No evidence of acute fracture to the lumbar spine. Chronic L1 vertebral compression fracture status post vertebral augmentation. No appreciable progressive height loss as compared to the prior lumbar spine radiographs of 02/01/2021. Bony retropulsion at the level of the L1 superior endplate mildly narrows the spinal canal. Subtle chronic T12 anterior wedge vertebral compression deformity, also unchanged from the prior lumbar spine radiographs of 02/01/2021. Lumbar spondylosis, as described. Electronically Signed   By: Kellie Simmering D.O.   On: 07/21/2022 15:08   CT CHEST ABDOMEN PELVIS W CONTRAST  Result Date: 07/21/2022 CLINICAL DATA:  Provided history: Poly trauma, blunt. Additional history provided: Fall, on blood thinners. EXAM: CT CHEST, ABDOMEN, AND PELVIS WITH CONTRAST TECHNIQUE: Multidetector CT imaging of the chest, abdomen and pelvis was performed following the standard protocol during bolus administration of intravenous contrast. RADIATION DOSE REDUCTION: This exam was performed according to the departmental dose-optimization program which includes automated exposure control, adjustment of the mA  and/or kV according to patient size and/or use of iterative reconstruction technique. CONTRAST:  8m OMNIPAQUE IOHEXOL 350 MG/ML SOLN COMPARISON:  CT angiogram chest 12/02/2020. CT chest/abdomen/pelvis 06/25/2018. Radiographs of the lumbar spine 02/01/2021. FINDINGS: CARDIOVASCULAR: Cardiomegaly. Calcified atherosclerotic plaque within the coronary arteries. Scattered atherosclerotic plaque within the thoracic aorta. No evidence of acute traumatic vascular injury. MEDIASTINUM/NODES: No mediastinal hematoma.No lymphadenopathy. 3 mm nodule within the right thyroid lobe, not meeting consensus criteria for ultrasound follow-up based on size. No follow-up imaging is recommended. LUNGS/PLEURA: No airspace consolidation.  No pleural fluid or pneumothorax. The central airways are grossly patent. MUSCULOSKELETAL: No acute fracture is identified. Multilevel bridging ventrolateral osteophytes within the thoracic spine with relative preservation of the intervertebral disc spaces. 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 HEPATOBILIARY: Hepatic steatosis. No focal hepatic lesion is identified. Prior cholecystectomy.No intra or extrahepatic biliary ductal dilatation. PANCREAS: No focal lesion.No ductal dilatation or peripancreatic fluid. SPLEEN:Normal in size without focal lesion. Incidentally noted splenule at the splenic hilum. ADRENALS/URINARY TRACT: No adrenal gland hemorrhage or mass. The kidneys are normal in size. Small focus of cortical scarring within the posterior aspect of the interpolar right kidney. Redemonstrated 2.6 cm simple fluid attenuation cyst arising from the posterior aspect of the interpolar left kidney. No follow-up imaging is recommended. No evidence of acute traumatic renal injury. No hydronephrosis or renal calculus. Unremarkable appearance of the ureters. Unremarkable appearance of the bladder given the degree of distension. STOMACH/BOWEL: The stomach is unremarkable. No  evidence of acute traumatic bowel injury. No bowel wall thickening, bowel dilatation or evidence of inflammation. Sigmoid diverticulosis without CT evidence of diverticulitis. VASCULAR/LYMPHATIC: Streak and beam hardening artifact arising from bilateral hip prostheses limits evaluation of the pelvic contents. Within this limitation, there is no appreciable a abdominopelvic lymphadenopathy. No evidence of acute traumatic injury to the abdominal aorta. Atherosclerotic plaque within the abdominal aorta and bilateral iliac arteries. REPRODUCTIVE: Not well evaluated due to streak and beam hardening artifact arising from bilateral hip prostheses. OTHER: No appreciable abdominopelvic ascites or mesenteric hematoma. MUSCULOSKELETAL: Prominent streak and beam hardening artifact arising from bilateral hip prostheses, partially obscuring regional osseous structures. No evidence of acute fracture Chronic L1 vertebral compression fracture status post vertebral augmentation. No progressive height loss as compared to the prior lumbar spine radiographs of 02/01/2021. Please refer to the separately reported lumbar spine CT for additional lumbar spine findings. IMPRESSION: 1. Streak and beam hardening artifact arising from bilateral hip prostheses partially obscures regional osseous structures, and obscures portions of the pelvic contents. 2. Within this limitation, there is no evidence of acute traumatic injury to the chest, abdomen or pelvis. 3. Cardiomegaly. 4. Coronary artery atherosclerosis. 5.  Aortic Atherosclerosis (ICD10-I70.0). 6. Hepatic steatosis. 7. Redemonstrated small focus of cortical scarring within the posterior aspect of the interpolar right kidney. 8. Diverticulosis without CT evidence of diverticulitis. 9. Chronic L1 vertebral compression fracture status post vertebral augmentation. No progressive height loss as compared to the prior lumbar spine radiographs of 02/01/2021. 10. Findings suggestive of diffuse  idiopathic skeletal hyperostosis (DISH). Electronically Signed   By: Kellie Simmering D.O.   On: 07/21/2022 14:54   CT HEAD WO CONTRAST (5MM)  Result Date: 07/21/2022 CLINICAL DATA:  Provided history: Head trauma, minor. Polytrauma, blunt. Additional history provided: Fall. EXAM: CT HEAD WITHOUT CONTRAST CT CERVICAL SPINE WITHOUT CONTRAST TECHNIQUE: Multidetector CT imaging of the head and cervical spine was performed following the standard protocol without intravenous contrast. Multiplanar CT image reconstructions of the cervical spine were also generated. RADIATION DOSE REDUCTION: This exam was performed according to the departmental dose-optimization program which includes automated exposure control, adjustment of the mA and/or kV according to patient size and/or use of iterative reconstruction technique. COMPARISON:  Head CT 04/26/2021. Cervical spine CT 04/26/2021. FINDINGS: CT HEAD FINDINGS Brain: No age advanced or lobar predominant parenchymal atrophy. Partially empty sella turcica. There is no acute intracranial hemorrhage. No demarcated cortical infarct. No extra-axial fluid collection. No evidence of an intracranial mass. No midline shift. Vascular: No hyperdense vessel. Atherosclerotic calcifications. Skull: No fracture  or aggressive osseous lesion. Sinuses/Orbits: No mass or acute finding within the imaged orbits. No significant paranasal sinus disease. CT CERVICAL SPINE FINDINGS Alignment: Straightening of the expected cervical lordosis. No significant spondylolisthesis. Skull base and vertebrae: The basion-dental and atlanto-dental intervals are maintained.No evidence of acute fracture to the cervical spine. Soft tissues and spinal canal: No prevertebral fluid or swelling. No visible canal hematoma. Disc levels: Cervical spondylosis with multilevel disc space narrowing, disc bulges/central disc protrusions, posterior disc osteophyte complexes, endplate spurring and uncovertebral hypertrophy. Disc  space narrowing is greatest at C4-C5, C5-C6 and C6-C7 (moderate to advanced at these levels). No appreciable high-grade spinal canal stenosis. Multilevel bony neural foraminal narrowing. Upper chest: No consolidation within the imaged lung apices. No visible pneumothorax. IMPRESSION: CT head: No evidence of acute intracranial abnormality. CT cervical spine: 1. No evidence of acute fracture to the cervical spine. 2. Nonspecific straightening of the expected cervical lordosis. 3. Cervical spondylosis, as described. Electronically Signed   By: Kellie Simmering D.O.   On: 07/21/2022 14:18   CT Cervical Spine Wo Contrast  Result Date: 07/21/2022 CLINICAL DATA:  Provided history: Head trauma, minor. Polytrauma, blunt. Additional history provided: Fall. EXAM: CT HEAD WITHOUT CONTRAST CT CERVICAL SPINE WITHOUT CONTRAST TECHNIQUE: Multidetector CT imaging of the head and cervical spine was performed following the standard protocol without intravenous contrast. Multiplanar CT image reconstructions of the cervical spine were also generated. RADIATION DOSE REDUCTION: This exam was performed according to the departmental dose-optimization program which includes automated exposure control, adjustment of the mA and/or kV according to patient size and/or use of iterative reconstruction technique. COMPARISON:  Head CT 04/26/2021. Cervical spine CT 04/26/2021. FINDINGS: CT HEAD FINDINGS Brain: No age advanced or lobar predominant parenchymal atrophy. Partially empty sella turcica. There is no acute intracranial hemorrhage. No demarcated cortical infarct. No extra-axial fluid collection. No evidence of an intracranial mass. No midline shift. Vascular: No hyperdense vessel. Atherosclerotic calcifications. Skull: No fracture or aggressive osseous lesion. Sinuses/Orbits: No mass or acute finding within the imaged orbits. No significant paranasal sinus disease. CT CERVICAL SPINE FINDINGS Alignment: Straightening of the expected cervical  lordosis. No significant spondylolisthesis. Skull base and vertebrae: The basion-dental and atlanto-dental intervals are maintained.No evidence of acute fracture to the cervical spine. Soft tissues and spinal canal: No prevertebral fluid or swelling. No visible canal hematoma. Disc levels: Cervical spondylosis with multilevel disc space narrowing, disc bulges/central disc protrusions, posterior disc osteophyte complexes, endplate spurring and uncovertebral hypertrophy. Disc space narrowing is greatest at C4-C5, C5-C6 and C6-C7 (moderate to advanced at these levels). No appreciable high-grade spinal canal stenosis. Multilevel bony neural foraminal narrowing. Upper chest: No consolidation within the imaged lung apices. No visible pneumothorax. IMPRESSION: CT head: No evidence of acute intracranial abnormality. CT cervical spine: 1. No evidence of acute fracture to the cervical spine. 2. Nonspecific straightening of the expected cervical lordosis. 3. Cervical spondylosis, as described. Electronically Signed   By: Kellie Simmering D.O.   On: 07/21/2022 14:18   DG Pelvis Portable  Result Date: 07/21/2022 CLINICAL DATA:  Fall. EXAM: PORTABLE PELVIS 1-2 VIEWS COMPARISON:  April 26, 2021. FINDINGS: There is no evidence of pelvic fracture or diastasis. No pelvic bone lesions are seen. Status post bilateral total hip arthroplasties. IMPRESSION: No acute abnormality is noted. Electronically Signed   By: Marijo Conception M.D.   On: 07/21/2022 13:48   DG Chest Portable 1 View  Result Date: 07/21/2022 CLINICAL DATA:  Female presents following fall. EXAM: PORTABLE CHEST 1 VIEW  COMPARISON:  September 06, 2021. FINDINGS: EKG leads project over the chest. Trachea midline. Cardiomediastinal contours and hilar structures with signs of moderate cardiac enlargement similar to prior imaging accounting for AP projection. Lungs are clear.  No sign of pneumothorax. On limited assessment no acute skeletal process. IMPRESSION: Moderate  cardiac enlargement. No acute cardiopulmonary disease. Electronically Signed   By: Zetta Bills M.D.   On: 07/21/2022 13:47    Procedures Procedures    Medications Ordered in ED Medications  ondansetron (ZOFRAN) injection 4 mg (4 mg Intravenous Not Given 07/21/22 1402)  0.9 %  sodium chloride infusion (1,000 mLs Intravenous New Bag/Given 07/21/22 1325)  ketorolac (TORADOL) 15 MG/ML injection 15 mg (has no administration in time range)  ondansetron (ZOFRAN) injection 4 mg ( Intravenous See Procedure Record 07/21/22 1403)  iohexol (OMNIPAQUE) 350 MG/ML injection 75 mL (75 mLs Intravenous Contrast Given 07/21/22 1409)    ED Course/ Medical Decision Making/ A&P                           Medical Decision Making Amount and/or Complexity of Data Reviewed Labs: ordered. Radiology: ordered.  Risk Prescription drug management.   Patient presented after fall.  Hit head.  Level 2 trauma due to anticoagulation.  Initial differential diagnosis included traumatic injury particular to the head and the spine where she is complaining of the pain.  However patient was initially hypotensive.  Rechecked manually and did have blood pressures of under 90.  Thought to be somewhat incidental and not related to the trauma.  Had not been dizzy before the fall and does not have abdominal or chest tenderness.  No evidence of bleeding.  However with hypotension on a level 2 trauma was activated as a level 1 trauma.  I discussed with Dr. Bobbye Morton who came and saw the patient. Initial chest x-ray independently interpreted reassuring.  Pelvic exam also interpreted and shows no fracture.  Scans of head cervical spine chest abdomen pelvis done due to hypotension and the pain is critically on the spine.  Reassuring with no definite acute injury.  Patient feeling somewhat better.  She is apologizing for having to come in putting [name] through the work.  We will give a dose of Toradol to help with the pain.  Appears stable for  discharge home.  Lab work reviewed and reassuring.        Final Clinical Impression(s) / ED Diagnoses Final diagnoses:  Fall, initial encounter  Strain of lumbar region, initial encounter    Rx / DC Orders ED Discharge Orders     None         Davonna Belling, MD 07/21/22 1519

## 2022-07-21 NOTE — Progress Notes (Signed)
Patient refused the use of CPAP for the evening.  

## 2022-07-21 NOTE — ED Notes (Signed)
Pt called out complaining of CP described as tight and prickly. Notified EDP and EKG obtained. Pt is in A-fib at controlled rate.

## 2022-07-21 NOTE — ED Notes (Signed)
Pt back from CT at this time 

## 2022-07-21 NOTE — ED Provider Notes (Signed)
Patient was initially seen by Dr. Alvino Chapel.  Please see his note.  Initial plan was to discharge.  However patient continues to remain very dizzy.  She does not feel like she can stand without feeling like she is going to fall.  Patient states all the symptoms occurred after her initial fall.  She did not have dizziness initially.  No serious traumatic injuries noted on her work-up.  Patient does have multiple chronic medical illnesses.  She lives at home alone.  I do not feel that she is safe for discharge with her dizziness.  I will consult with the hospitalist for admission.  Case discussed with Dr Lorin Mercy regarding admission      Dorie Rank, MD 07/22/22 1540

## 2022-07-21 NOTE — Progress Notes (Signed)
This chaplain responded to Level 2 trauma with the medical team. Pt. later updated to Level 1.   The Pt. is communicating with the medical team. This chaplain was unable to speak with the Pt.   The chaplain checked in with the MD and is available for F/U spiritual care as needed.  Chaplain Sallyanne Kuster [phone]

## 2022-07-21 NOTE — Discharge Instructions (Addendum)
Follow with Primary MD Isaac Bliss, Rayford Halsted, MD in 7 days   Get CBC, CMP, Magnesium -  checked next visit within 1 week by Primary MD    Activity: As tolerated with Full fall precautions use walker/cane & assistance as needed  Disposition Home    Diet: Heart Healthy   Special Instructions: If you have smoked or chewed Tobacco  in the last 2 yrs please stop smoking, stop any regular Alcohol  and or any Recreational drug use.  On your next visit with your primary care physician please Get Medicines reviewed and adjusted.  Please request your Prim.MD to go over all Hospital Tests and Procedure/Radiological results at the follow up, please get all Hospital records sent to your Prim MD by signing hospital release before you go home.  If you experience worsening of your admission symptoms, develop shortness of breath, life threatening emergency, suicidal or homicidal thoughts you must seek medical attention immediately by calling 911 or calling your MD immediately  if symptoms less severe.  You Must read complete instructions/literature along with all the possible adverse reactions/side effects for all the Medicines you take and that have been prescribed to you. Take any new Medicines after you have completely understood and accpet all the possible adverse reactions/side effects.

## 2022-07-21 NOTE — ED Triage Notes (Signed)
Pt BIB [facility]. EMS reported pt was making her bed at home. She fell and hit head on night stand. She is a fall on Eliquis. EMS said paing did not have any any dizziness or loc. Since the fall patine c/o HA, nausea and backpain. She does have a history of back pain.

## 2022-07-21 NOTE — ED Notes (Signed)
Provided pt w/ some water. Pt reports dizziness has not changed. Pt still sitting on the side of the bed. Will continue to monitor.

## 2022-07-22 DIAGNOSIS — R42 Dizziness and giddiness: Secondary | ICD-10-CM | POA: Diagnosis not present

## 2022-07-22 LAB — TSH: TSH: 1.749 u[IU]/mL (ref 0.350–4.500)

## 2022-07-22 LAB — HIV ANTIBODY (ROUTINE TESTING W REFLEX): HIV Screen 4th Generation wRfx: NONREACTIVE

## 2022-07-22 MED ORDER — FUROSEMIDE 40 MG PO TABS: 40.0000 mg | ORAL_TABLET | Freq: Every day | ORAL | Status: DC

## 2022-07-22 NOTE — Evaluation (Signed)
Physical Therapy Vestibular Evaluation Patient Details Name: Kathy [name] MRN: [id number] DOB: 1950/12/05 Today's Date: 07/22/2022  History of Present Illness  71 y.o. female with medical history significant of anxiety/depression; chronic diastolic CHF; HTN; morbid obesity; OSA on CPAP; and afib presenting with dizziness. [place] and hit her head, admitted 06/20/22.  Clinical Impression  Pt admitted with above diagnosis. Demonstrates mild Rt vestibular hypofunction and significant motion sensitivity. Tests not indicating BPPV. Chronic on/off dizziness reported but no vertigo symptoms. Reports constant dizziness started after hitting her head. No Physical assist needed to ambulate but recommended she use her RW instead of her SPC due to current symptoms - able to walk 25 feet with PT, no LOB, further distance declined due to back pain (chronic, but worse since fall.) Pt currently with functional limitations due to the deficits listed below (see PT Problem List). Patient reports she will have adequate assistance at home from her two sons and agreeable to [place]. Once able to reasonably transport to [facility] would benefit from further vestibular and balance training due to history of multiple falls resulting in hospitalization. Will follow until d/c.          07/22/22 0001  Vestibular Assessment  General Observation In bed, eyes closed, seemingly unwell  Symptom Behavior  Subjective history of current problem States she feels worse today, sore all over from fall. Still dizzy, at rest, when standing, and when she moves her eyes.  Type of Dizziness  Blurred vision;Unsteady with head/body turns;Imbalance  Frequency of Dizziness Intermittent, nearly constant  Duration of Dizziness nearly constant  Symptom Nature Motion provoked;Spontaneous;Constant;Intermittent  Aggravating Factors Moving eyes;Sit to stand;Spontaneous onset  Relieving Factors Head stationary;Closing eyes  Progression of Symptoms Worse   History of similar episodes Reports hx of Vertigo, but this feels different.  Oculomotor Exam  Spontaneous Absent  Gaze-induced  Right beating nystagmus with R gaze  Smooth Pursuits Intact  Vestibulo-Ocular Reflex  VOR 1 Head Only (x 1 viewing) unable to tolerate - increased dizziness  Positional Testing  Dix-Hallpike Dix-Hallpike Right;Dix-Hallpike Left  Sidelying Test Sidelying Right;Sidelying Left  Horizontal Canal Testing Horizontal Canal Right;Horizontal Canal Left  Dix-Hallpike Right  Dix-Hallpike Right Symptoms No nystagmus  Dix-Hallpike Left  Dix-Hallpike Left Symptoms No nystagmus  Sidelying Right  Sidelying Right Symptoms No nystagmus  Sidelying Left  Sidelying Left Symptoms No nystagmus  Horizontal Canal Right  Horizontal Canal Right Symptoms Normal  Horizontal Canal Left  Horizontal Canal Left Symptoms Normal  Cognition  Cognition Orientation Level Oriented x 4  Positional Sensitivities  Positional Sensitivities Comments Reports dizziness with all movement.  Orthostatics  BP supine (x 5 minutes) 116/72  HR supine (x 5 minutes) 87  BP sitting 127/87  HR sitting 105  BP standing (after 1 minute) 139/70  HR standing (after 1 minute) 99  BP standing (after 3 minutes) 119/84  HR standing (after 3 minutes) 106    Recommendations for follow up therapy are one component of a multi-disciplinary discharge planning process, led by the attending physician.  Recommendations may be updated based on patient status, additional functional criteria and insurance authorization.  Follow Up Recommendations Home health PT ((Will likely need vestibular follow-up from PT in [facility] or OP))      Assistance Recommended at Discharge Intermittent Supervision/Assistance  Patient can return home with the following  A little help with walking and/or transfers;A little help with bathing/dressing/bathroom;Assistance with cooking/housework;Assist for transportation;Help with stairs or ramp for  entrance    Equipment Recommendations None recommended  by PT  Recommendations for Other Services       Functional Status Assessment Patient has had a recent decline in their functional status and demonstrates the ability to make significant improvements in function in a reasonable and predictable amount of time.     Precautions / Restrictions Precautions Precautions: Fall      Mobility  Bed Mobility Overal bed mobility: Needs Assistance Bed Mobility: Supine to Sit, Sit to Supine     Supine to sit: Supervision Sit to supine: Min assist   General bed mobility comments: min assist for LEs because of back pain    Transfers Overall transfer level: Needs assistance Equipment used: Straight cane Transfers: Sit to/from Stand Sit to Stand: Supervision           General transfer comment: supervision for safety, no assist needed. Mild sway upon standing. No overt LOB though. Improved with use of RW.    Ambulation/Gait Ambulation/Gait assistance: Supervision Gait Distance (Feet): 25 Feet Assistive device: Rolling walker (2 wheels) Gait Pattern/deviations: Step-through pattern   Gait velocity interpretation: <1.31 ft/sec, indicative of household ambulator   General Gait Details: slower and guarded but reporting due to back pain (chronic but worse today.) No LOB, good RW control. Declines further distance becauase of back pain. Educated on importance of RW use due to recent fall and unsteadiness.  Stairs            Wheelchair Mobility    Modified Rankin (Stroke Patients Only)       Balance Overall balance assessment: Needs assistance Sitting-balance support: No upper extremity supported, Feet supported Sitting balance-Leahy Scale: Good     Standing balance support: No upper extremity supported Standing balance-Leahy Scale: Fair                               Pertinent Vitals/Pain Pain Assessment Pain Assessment: 0-10 Pain Score: 7  Pain  Location: back and head Pain Descriptors / Indicators: Aching Pain Intervention(s): Limited activity within patient's tolerance, Monitored during session, Repositioned    Home Living Family/patient expects to be discharged to:: Private residence Living Arrangements: Alone Available Help at Discharge: Family;Available PRN/intermittently Type of Home: House Home Access: Stairs to enter Entrance Stairs-Rails: Right;Left Entrance Stairs-Number of Steps: 2   Home Layout: Two level;Able to live on main level with bedroom/bathroom Home Equipment: Rolling Walker (2 wheels);Cane - single point      Prior Function Prior Level of Function : Independent/Modified Independent;History of Falls (last six months)             Mobility Comments: Uses SPC       Hand Dominance   Dominant Hand: Right    Extremity/Trunk Assessment   Upper Extremity Assessment Upper Extremity Assessment: Defer to OT evaluation    Lower Extremity Assessment Lower Extremity Assessment: Generalized weakness (no focal deficits noted)       Communication   Communication: No difficulties  Cognition Arousal/Alertness: Awake/alert Behavior During Therapy: WFL for tasks assessed/performed Overall Cognitive Status: Within Functional Limits for tasks assessed                                          General Comments General comments (skin integrity, edema, etc.): See vestibular assessment    Exercises     Assessment/Plan    PT Assessment Patient needs continued PT services  PT Problem List Decreased strength;Decreased activity tolerance;Decreased balance;Decreased mobility;Obesity;Pain       PT Treatment Interventions DME instruction;Gait training;Stair training;Functional mobility training;Therapeutic activities;Therapeutic exercise;Balance training;Neuromuscular re-education;Patient/family education    PT Goals (Current goals can be found in the Care Plan section)  Acute Rehab PT  Goals Patient Stated Goal: Feel better, start PT PT Goal Formulation: With patient Time For Goal Achievement: 07/29/22 Potential to Achieve Goals: Good    Frequency Min 3X/week     Co-evaluation               AM-PAC PT "6 Clicks" Mobility  Outcome Measure Help needed turning from your back to your side while in a flat bed without using bedrails?: None Help needed moving from lying on your back to sitting on the side of a flat bed without using bedrails?: None Help needed moving to and from a bed to a chair (including a wheelchair)?: None Help needed standing up from a chair using your arms (e.g., wheelchair or bedside chair)?: A Little Help needed to walk in hospital room?: A Little Help needed climbing 3-5 steps with a railing? : A Little 6 Click Score: 21    End of Session Equipment Utilized During Treatment: Gait belt Activity Tolerance: Patient limited by pain Patient left: in bed;with call bell/phone within reach;with bed alarm set   PT Visit Diagnosis: Other abnormalities of gait and mobility (R26.89);Unsteadiness on feet (R26.81);History of falling (Z91.81);Muscle weakness (generalized) (M62.81);Difficulty in walking, not elsewhere classified (R26.2);Dizziness and giddiness (R42);Pain Pain - part of body:  (back)    Time: 4431-5400 PT Time Calculation (min) (ACUTE ONLY): 48 min   Charges:   PT Evaluation $PT Eval Moderate Complexity: 1 Mod PT Treatments $Therapeutic Activity: 8-22 mins $Self Care/Home Management: 8-22        Candie Mile, PT, DPT Physical Therapist Acute Rehabilitation Services [place] 07/22/2022, 11:49 AM

## 2022-07-22 NOTE — Plan of Care (Signed)
  Problem: Education: Goal: Knowledge of General Education information will improve Description: Including pain rating scale, medication(s)/side effects and non-pharmacologic comfort measures 07/22/2022 1150 by Vonna Kotyk, RN Outcome: Adequate for Discharge 07/22/2022 0745 by Vonna Kotyk, RN Outcome: Progressing   Problem: Health Behavior/Discharge Planning: Goal: Ability to manage health-related needs will improve 07/22/2022 1150 by Vonna Kotyk, RN Outcome: Adequate for Discharge 07/22/2022 0745 by Vonna Kotyk, RN Outcome: Progressing   Problem: Clinical Measurements: Goal: Ability to maintain clinical measurements within normal limits will improve 07/22/2022 1150 by Vonna Kotyk, RN Outcome: Adequate for Discharge 07/22/2022 0745 by Vonna Kotyk, RN Outcome: Progressing Goal: Will remain free from infection 07/22/2022 1150 by Vonna Kotyk, RN Outcome: Adequate for Discharge 07/22/2022 0745 by Vonna Kotyk, RN Outcome: Progressing Goal: Diagnostic test results will improve 07/22/2022 1150 by Vonna Kotyk, RN Outcome: Adequate for Discharge 07/22/2022 0745 by Vonna Kotyk, RN Outcome: Progressing Goal: Respiratory complications will improve 07/22/2022 1150 by Vonna Kotyk, RN Outcome: Adequate for Discharge 07/22/2022 0745 by Vonna Kotyk, RN Outcome: Progressing Goal: Cardiovascular complication will be avoided 07/22/2022 1150 by Vonna Kotyk, RN Outcome: Adequate for Discharge 07/22/2022 0745 by Vonna Kotyk, RN Outcome: Progressing   Problem: Activity: Goal: Risk for activity intolerance will decrease 07/22/2022 1150 by Vonna Kotyk, RN Outcome: Adequate for Discharge 07/22/2022 0745 by Vonna Kotyk, RN Outcome: Progressing   Problem: Nutrition: Goal: Adequate nutrition will be maintained 07/22/2022 1150 by Vonna Kotyk, RN Outcome: Adequate for Discharge 07/22/2022 0745 by Vonna Kotyk, RN Outcome: Progressing   Problem: Coping: Goal: Level of anxiety  will decrease 07/22/2022 1150 by Vonna Kotyk, RN Outcome: Adequate for Discharge 07/22/2022 0745 by Vonna Kotyk, RN Outcome: Progressing   Problem: Elimination: Goal: Will not experience complications related to bowel motility 07/22/2022 1150 by Vonna Kotyk, RN Outcome: Adequate for Discharge 07/22/2022 0745 by Vonna Kotyk, RN Outcome: Progressing Goal: Will not experience complications related to urinary retention 07/22/2022 1150 by Vonna Kotyk, RN Outcome: Adequate for Discharge 07/22/2022 0745 by Vonna Kotyk, RN Outcome: Progressing   Problem: Pain Managment: Goal: General experience of comfort will improve 07/22/2022 1150 by Vonna Kotyk, RN Outcome: Adequate for Discharge 07/22/2022 0745 by Vonna Kotyk, RN Outcome: Progressing   Problem: Safety: Goal: Ability to remain free from injury will improve 07/22/2022 1150 by Vonna Kotyk, RN Outcome: Adequate for Discharge 07/22/2022 0745 by Vonna Kotyk, RN Outcome: Progressing   Problem: Skin Integrity: Goal: Risk for impaired skin integrity will decrease 07/22/2022 1150 by Vonna Kotyk, RN Outcome: Adequate for Discharge 07/22/2022 0745 by Vonna Kotyk, RN Outcome: Progressing

## 2022-07-22 NOTE — TOC Transition Note (Signed)
Transition of Care [facility]) - CM/SW Discharge Note   Patient Details  Name: Kathy [name] MRN: [id number] Date of Birth: [birth year]  Transition of Care [facility]) CM/SW Contact:  Carles Collet, RN Phone Number: 07/22/2022, 12:57 PM   Clinical Narrative:     [name] w patient at bedside. She confirms that she has all needed DME at home.  We discussed [place] providers and she would like CM to pick highly rated agency from Medicare.gov list. [facility] able to accept for services, added to AVS. Patient aware they will call her in 1-2 days to set up home services. No other TOC needs identified.   Final next level of care: [place] Barriers to Discharge: No Barriers Identified   Patient Goals and CMS Choice Patient states their goals for this hospitalization and ongoing recovery are:: to go home CMS Medicare.gov Compare Post Acute Care list provided to:: Patient Choice offered to / list presented to : Patient  Discharge Placement                       Discharge Plan and Services                DME Arranged: N/A         HH Arranged: PT, OT HH Agency: [place] Date [place]: 07/22/22 Time [place]: [id number] Representative spoke with at [place]: [place] Determinants of Health ([employer]) Interventions Housing Interventions: Intervention Not Indicated   Readmission Risk Interventions     No data to display

## 2022-07-22 NOTE — Plan of Care (Signed)

## 2022-07-22 NOTE — Discharge Summary (Signed)
JAYCEE MCKELLIPS VFI:[id number] DOB: 26-Nov-1950 DOA: 07/21/2022  PCP: Erline Hau, MD  Admit date: 07/21/2022  Discharge date: 07/22/2022  Admitted From: Home   Disposition:  Home   Recommendations for Outpatient Follow-up:   Follow up with PCP in 1-2 weeks  PCP Please obtain BMP/CBC, 2 view CXR in 1week,  (see Discharge instructions)   PCP Please follow up on the following pending results:    Home Health: PT, OT   Equipment/Devices: Home walker  Consultations: None  Discharge Condition: Stable    CODE STATUS: Full    Diet Recommendation: Heart Healthy     Chief Complaint  Patient presents with   Level 1 Trauma Fall on Thinners     Brief history of present illness from the day of admission and additional interim summary    71 y.o. female with medical history significant of anxiety/depression; chronic diastolic CHF; HTN; morbid obesity; OSA on CPAP; and afib presenting with dizziness apparently she was started on Neurontin in the outpatient setting about 10 days ago since then she has been feeling lightheaded at times, yesterday she had an episode of lightheadedness after which she fell and hit her head on a piece of furniture.  She was brought to the ER where she underwent head CT which was nonacute.  She was kept overnight for observation.                                                                 Hospital Course    Dizziness, presyncope and fall, soft tissue head injury with mild concussive symptoms.  Since she was on Eliquis and hit her head after the fall she had head and neck CT which were both nonacute.  Her headache is much improved, dizziness improved, no focal deficits.  No problems with vision.  Her symptoms of dizziness started pretty much after she started taking Neurontin 10  days ago.  This medication will be stopped.  As needed Tylenol for headache.  She worked with PT and did well, will also get.  Home PT OT.  Follow-up with PCP in a week.  Avoid Neurontin in the outpatient setting in the future.  Chronic diastolic CHF.  Compensated continue home medications.  Hypertension.  Continue diltiazem and Toprol-XL, also takes diuretic Lasix.  OSA.  Nighttime CPAP.  Anxiety, depression and chronic pain.  Home medications continued which include fluoxetine, as needed alprazolam along with as needed narcotic and muscle relaxer.  Paroxysmal atrial fibrillation.  Continue Toprol and diltiazem.  Mali vasc 2 score of greater than 2.  Continue Eliquis.  Morbid obesity.  BMI of 46.  Follow with PCP for weight loss.   Discharge diagnosis     Principal Problem:   Dizziness Active Problems:   Morbid obesity ([place])   Essential  hypertension   Anxiety and depression   Persistent atrial fibrillation (HCC)   Chronic diastolic CHF (congestive heart failure) (HCC)   OSA on CPAP   Chronic pain disorder    Discharge instructions    Discharge Instructions     Diet - low sodium heart healthy   Complete by: As directed    Discharge instructions   Complete by: As directed    Follow with Primary MD Isaac Bliss, Rayford Halsted, MD in 7 days   Get CBC, CMP, Magnesium -  checked next visit within 1 week by Primary MD    Activity: As tolerated with Full fall precautions use walker/cane & assistance as needed  Disposition Home    Diet: Heart Healthy   Special Instructions: If you have smoked or chewed Tobacco  in the last 2 yrs please stop smoking, stop any regular Alcohol  and or any Recreational drug use.  On your next visit with your primary care physician please Get Medicines reviewed and adjusted.  Please request your Prim.MD to go over all Hospital Tests and Procedure/Radiological results at the follow up, please get all Hospital records sent to your Prim MD by  signing hospital release before you go home.  If you experience worsening of your admission symptoms, develop shortness of breath, life threatening emergency, suicidal or homicidal thoughts you must seek medical attention immediately by calling 911 or calling your MD immediately  if symptoms less severe.  You Must read complete instructions/literature along with all the possible adverse reactions/side effects for all the Medicines you take and that have been prescribed to you. Take any new Medicines after you have completely understood and accpet all the possible adverse reactions/side effects.   Increase activity slowly   Complete by: As directed        Discharge Medications   Allergies as of 07/22/2022       Reactions   Achromycin [tetracycline Hcl] Shortness Of Breath   Dyspnea   Penicillins    Facial angioedema Did it involve swelling of the face/tongue/throat, SOB, or low BP? Yes Did it involve sudden or severe rash/hives, skin peeling, or any reaction on the inside of your mouth or nose? No Did you need to seek medical attention at a hospital or doctor's office? No When did it last happen?      30+ years If all above answers are "NO", may proceed with cephalosporin use.   Sulfamethoxazole-trimethoprim Hives   Nickel Itching   Zanaflex [tizanidine Hydrochloride]    Causes more pain         Medication List     STOP taking these medications    gabapentin 300 MG capsule Commonly known as: NEURONTIN       TAKE these medications    ALPRAZolam 1 MG tablet Commonly known as: XANAX TAKE 1 TABLET BY MOUTH THREE TIMES A DAY AS NEEDED FOR ANXIETY What changed:  how much to take how to take this when to take this reasons to take this additional instructions   apixaban 5 MG Tabs tablet Commonly known as: Eliquis Take 1 tablet (5 mg total) by mouth 2 (two) times daily.   atorvastatin 40 MG tablet Commonly known as: LIPITOR Take 1 tablet (40 mg total) by mouth  daily.   CALCIUM 600 PO Take 1 tablet by mouth daily.   clindamycin 1 % lotion Commonly known as: CLEOCIN T Apply 1 application topically 2 (two) times daily as needed (rosacea (skin irritation)).   clobetasol 0.05 % external  solution Commonly known as: TEMOVATE Apply 1 application topically daily as needed (rosacea).   diltiazem 120 MG 24 hr capsule Commonly known as: CARDIZEM CD Take 1 capsule (120 mg total) by mouth daily.   fluocinonide 0.05 % external solution Commonly known as: LIDEX Apply 1 application topically daily as needed (rosacea).   FLUoxetine 40 MG capsule Commonly known as: PROZAC TAKE 1 CAPSULE BY MOUTH EVERY DAY What changed: how much to take   furosemide 40 MG tablet Commonly known as: LASIX TAKE 1 TABLET BY MOUTH DAILY. PLEASE KEEP UPCOMING APPOINTMENT IN JUNE 2023 FOR FUTURE REFILLS. What changed: See the new instructions.   HYDROcodone-acetaminophen 5-325 MG tablet Commonly known as: NORCO/VICODIN Take 1 tablet by mouth every 6 (six) hours as needed for moderate pain.   methocarbamol 500 MG tablet Commonly known as: ROBAXIN Take 500 mg by mouth every 8 (eight) hours as needed.   metoprolol succinate 100 MG 24 hr tablet Commonly known as: TOPROL-XL TAKE 1 TABLET BY MOUTH EVERY DAY   metroNIDAZOLE 0.75 % cream Commonly known as: METROCREAM Apply 1 application topically 2 (two) times daily as needed (rosacea).   multivitamin with minerals Tabs tablet Take 1 tablet by mouth daily. Centrum Silver   omeprazole 40 MG capsule Commonly known as: PRILOSEC TAKE 1 CAPSULE BY MOUTH EVERY DAY What changed: how much to take   potassium chloride 10 MEQ tablet Commonly known as: KLOR-CON Take 1 tablet (10 mEq total) by mouth daily.         Follow-up Information     Isaac Bliss, Rayford Halsted, MD. Schedule an appointment as soon as possible for a visit today.   Specialty: Internal Medicine Why: As needed Contact information: [place] [address] [phone]                 Major procedures and Radiology Reports - PLEASE review detailed and final reports thoroughly  -      CT L-SPINE NO CHARGE  Result Date: 07/21/2022 CLINICAL DATA:  Provided history: Level 1 trauma. Fall on blood thinners. EXAM: CT LUMBAR SPINE WITHOUT CONTRAST TECHNIQUE: Multidetector CT imaging of the lumbar spine was performed without intravenous contrast administration. Multiplanar CT image reconstructions were also generated. RADIATION DOSE REDUCTION: This exam was performed according to the departmental dose-optimization program which includes automated exposure control, adjustment of the mA and/or kV according to patient size and/or use of iterative reconstruction technique. COMPARISON:  Lumbar spine radiographs 02/01/2021. FINDINGS: Segmentation: 5 lumbar vertebrae. The caudal most well-formed interval disc space is designated L5-S1. Alignment: 5 mm bony retropulsion at the level of the L1 superior endplate, similar the prior lumbar spine radiographs of 02/01/2021. Vertebrae: Chronic L1 vertebral compression fracture status post vertebral augmentation. No appreciable progressive height loss as compared to the prior lumbar spine radiographs of 02/01/2021. Subtle chronic T12 anterior wedge vertebral compression deformity, also unchanged from this prior exam. No evidence of acute fracture to the lumbar spine. Paraspinal and other soft tissues: No paraspinal hematoma, mass or collection. Please refer to the same day CT chest/abdomen/pelvis for a description of abdominopelvic soft tissue findings. Disc levels: Disc space narrowing is moderate at T12-L1 and mild-to-moderate at L1-L2. No more than mild disc space narrowing elsewhere within the lumbar spine. At T12-L1, bony retropulsion at the level of the L1 superior endplate mildly narrows the spinal canal. Within the lumbar spine, there are multilevel disc bulges. Facet arthrosis,  greatest at L3-L4, L4-L5 and L5-S1. Minimal endplate spurring at  L5-S1. No appreciable high-grade spinal canal stenosis. Multilevel neural foraminal narrowing, greatest on the left at L5-S1 (moderate at this site). IMPRESSION: No evidence of acute fracture to the lumbar spine. Chronic L1 vertebral compression fracture status post vertebral augmentation. No appreciable progressive height loss as compared to the prior lumbar spine radiographs of 02/01/2021. Bony retropulsion at the level of the L1 superior endplate mildly narrows the spinal canal. Subtle chronic T12 anterior wedge vertebral compression deformity, also unchanged from the prior lumbar spine radiographs of 02/01/2021. Lumbar spondylosis, as described. Electronically Signed   By: Kellie Simmering D.O.   On: 07/21/2022 15:08   CT CHEST ABDOMEN PELVIS W CONTRAST  Result Date: 07/21/2022 CLINICAL DATA:  Provided history: Poly trauma, blunt. Additional history provided: Fall, on blood thinners. EXAM: CT CHEST, ABDOMEN, AND PELVIS WITH CONTRAST TECHNIQUE: Multidetector CT imaging of the chest, abdomen and pelvis was performed following the standard protocol during bolus administration of intravenous contrast. RADIATION DOSE REDUCTION: This exam was performed according to the departmental dose-optimization program which includes automated exposure control, adjustment of the mA and/or kV according to patient size and/or use of iterative reconstruction technique. CONTRAST:  28m OMNIPAQUE IOHEXOL 350 MG/ML SOLN COMPARISON:  CT angiogram chest 12/02/2020. CT chest/abdomen/pelvis 06/25/2018. Radiographs of the lumbar spine 02/01/2021. FINDINGS: CARDIOVASCULAR: Cardiomegaly. Calcified atherosclerotic plaque within the coronary arteries. Scattered atherosclerotic plaque within the thoracic aorta. No evidence of acute traumatic vascular injury. MEDIASTINUM/NODES: No mediastinal hematoma.No lymphadenopathy. 3 mm nodule within the right thyroid lobe, not meeting  consensus criteria for ultrasound follow-up based on size. No follow-up imaging is recommended. LUNGS/PLEURA: No airspace consolidation. No pleural fluid or pneumothorax. The central airways are grossly patent. MUSCULOSKELETAL: No acute fracture is identified. Multilevel bridging ventrolateral osteophytes within the thoracic spine with relative preservation of the intervertebral disc spaces. 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 HEPATOBILIARY: Hepatic steatosis. No focal hepatic lesion is identified. Prior cholecystectomy.No intra or extrahepatic biliary ductal dilatation. PANCREAS: No focal lesion.No ductal dilatation or peripancreatic fluid. SPLEEN:Normal in size without focal lesion. Incidentally noted splenule at the splenic hilum. ADRENALS/URINARY TRACT: No adrenal gland hemorrhage or mass. The kidneys are normal in size. Small focus of cortical scarring within the posterior aspect of the interpolar right kidney. Redemonstrated 2.6 cm simple fluid attenuation cyst arising from the posterior aspect of the interpolar left kidney. No follow-up imaging is recommended. No evidence of acute traumatic renal injury. No hydronephrosis or renal calculus. Unremarkable appearance of the ureters. Unremarkable appearance of the bladder given the degree of distension. STOMACH/BOWEL: The stomach is unremarkable. No evidence of acute traumatic bowel injury. No bowel wall thickening, bowel dilatation or evidence of inflammation. Sigmoid diverticulosis without CT evidence of diverticulitis. VASCULAR/LYMPHATIC: Streak and beam hardening artifact arising from bilateral hip prostheses limits evaluation of the pelvic contents. Within this limitation, there is no appreciable a abdominopelvic lymphadenopathy. No evidence of acute traumatic injury to the abdominal aorta. Atherosclerotic plaque within the abdominal aorta and bilateral iliac arteries. REPRODUCTIVE: Not well evaluated due to streak and beam  hardening artifact arising from bilateral hip prostheses. OTHER: No appreciable abdominopelvic ascites or mesenteric hematoma. MUSCULOSKELETAL: Prominent streak and beam hardening artifact arising from bilateral hip prostheses, partially obscuring regional osseous structures. No evidence of acute fracture Chronic L1 vertebral compression fracture status post vertebral augmentation. No progressive height loss as compared to the prior lumbar spine radiographs of 02/01/2021. Please refer to the separately reported lumbar spine CT for additional lumbar spine findings. IMPRESSION: 1. Streak and beam hardening artifact  arising from bilateral hip prostheses partially obscures regional osseous structures, and obscures portions of the pelvic contents. 2. Within this limitation, there is no evidence of acute traumatic injury to the chest, abdomen or pelvis. 3. Cardiomegaly. 4. Coronary artery atherosclerosis. 5.  Aortic Atherosclerosis (ICD10-I70.0). 6. Hepatic steatosis. 7. Redemonstrated small focus of cortical scarring within the posterior aspect of the interpolar right kidney. 8. Diverticulosis without CT evidence of diverticulitis. 9. Chronic L1 vertebral compression fracture status post vertebral augmentation. No progressive height loss as compared to the prior lumbar spine radiographs of 02/01/2021. 10. 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 Electronically Signed   By: Kellie Simmering D.O.   On: 07/21/2022 14:54   CT HEAD WO CONTRAST (5MM)  Result Date: 07/21/2022 CLINICAL DATA:  Provided history: Head trauma, minor. Polytrauma, blunt. Additional history provided: Fall. EXAM: CT HEAD WITHOUT CONTRAST CT CERVICAL SPINE WITHOUT CONTRAST TECHNIQUE: Multidetector CT imaging of the head and cervical spine was performed following the standard protocol without intravenous contrast. Multiplanar CT image reconstructions of the cervical spine were also generated. RADIATION DOSE REDUCTION: This  exam was performed according to the departmental dose-optimization program which includes automated exposure control, adjustment of the mA and/or kV according to patient size and/or use of iterative reconstruction technique. COMPARISON:  Head CT 04/26/2021. Cervical spine CT 04/26/2021. FINDINGS: CT HEAD FINDINGS Brain: No age advanced or lobar predominant parenchymal atrophy. Partially empty sella turcica. There is no acute intracranial hemorrhage. No demarcated cortical infarct. No extra-axial fluid collection. No evidence of an intracranial mass. No midline shift. Vascular: No hyperdense vessel. Atherosclerotic calcifications. Skull: No fracture or aggressive osseous lesion. Sinuses/Orbits: No mass or acute finding within the imaged orbits. No significant paranasal sinus disease. CT CERVICAL SPINE FINDINGS Alignment: Straightening of the expected cervical lordosis. No significant spondylolisthesis. Skull base and vertebrae: The basion-dental and atlanto-dental intervals are maintained.No evidence of acute fracture to the cervical spine. Soft tissues and spinal canal: No prevertebral fluid or swelling. No visible canal hematoma. Disc levels: Cervical spondylosis with multilevel disc space narrowing, disc bulges/central disc protrusions, posterior disc osteophyte complexes, endplate spurring and uncovertebral hypertrophy. Disc space narrowing is greatest at C4-C5, C5-C6 and C6-C7 (moderate to advanced at these levels). No appreciable high-grade spinal canal stenosis. Multilevel bony neural foraminal narrowing. Upper chest: No consolidation within the imaged lung apices. No visible pneumothorax. IMPRESSION: CT head: No evidence of acute intracranial abnormality. CT cervical spine: 1. No evidence of acute fracture to the cervical spine. 2. Nonspecific straightening of the expected cervical lordosis. 3. Cervical spondylosis, as described. Electronically Signed   By: Kellie Simmering D.O.   On: 07/21/2022 14:18   CT  Cervical Spine Wo Contrast  Result Date: 07/21/2022 CLINICAL DATA:  Provided history: Head trauma, minor. Polytrauma, blunt. Additional history provided: Fall. EXAM: CT HEAD WITHOUT CONTRAST CT CERVICAL SPINE WITHOUT CONTRAST TECHNIQUE: Multidetector CT imaging of the head and cervical spine was performed following the standard protocol without intravenous contrast. Multiplanar CT image reconstructions of the cervical spine were also generated. RADIATION DOSE REDUCTION: This exam was performed according to the departmental dose-optimization program which includes automated exposure control, adjustment of the mA and/or kV according to patient size and/or use of iterative reconstruction technique. COMPARISON:  Head CT 04/26/2021. Cervical spine CT 04/26/2021. FINDINGS: CT HEAD FINDINGS Brain: No age advanced or lobar predominant parenchymal atrophy. Partially empty sella turcica. There is no acute intracranial hemorrhage. No demarcated cortical infarct. No extra-axial fluid collection. No evidence of an intracranial mass. No  midline shift. Vascular: No hyperdense vessel. Atherosclerotic calcifications. Skull: No fracture or aggressive osseous lesion. Sinuses/Orbits: No mass or acute finding within the imaged orbits. No significant paranasal sinus disease. CT CERVICAL SPINE FINDINGS Alignment: Straightening of the expected cervical lordosis. No significant spondylolisthesis. Skull base and vertebrae: The basion-dental and atlanto-dental intervals are maintained.No evidence of acute fracture to the cervical spine. Soft tissues and spinal canal: No prevertebral fluid or swelling. No visible canal hematoma. Disc levels: Cervical spondylosis with multilevel disc space narrowing, disc bulges/central disc protrusions, posterior disc osteophyte complexes, endplate spurring and uncovertebral hypertrophy. Disc space narrowing is greatest at C4-C5, C5-C6 and C6-C7 (moderate to advanced at these levels). No appreciable  high-grade spinal canal stenosis. Multilevel bony neural foraminal narrowing. Upper chest: No consolidation within the imaged lung apices. No visible pneumothorax. IMPRESSION: CT head: No evidence of acute intracranial abnormality. CT cervical spine: 1. No evidence of acute fracture to the cervical spine. 2. Nonspecific straightening of the expected cervical lordosis. 3. Cervical spondylosis, as described. Electronically Signed   By: Kellie Simmering D.O.   On: 07/21/2022 14:18   DG Pelvis Portable  Result Date: 07/21/2022 CLINICAL DATA:  Fall. EXAM: PORTABLE PELVIS 1-2 VIEWS COMPARISON:  April 26, 2021. FINDINGS: There is no evidence of pelvic fracture or diastasis. No pelvic bone lesions are seen. Status post bilateral total hip arthroplasties. IMPRESSION: No acute abnormality is noted. Electronically Signed   By: Marijo Conception M.D.   On: 07/21/2022 13:48   DG Chest Portable 1 View  Result Date: 07/21/2022 CLINICAL DATA:  Female presents following fall. EXAM: PORTABLE CHEST 1 VIEW COMPARISON:  September 06, 2021. FINDINGS: EKG leads project over the chest. Trachea midline. Cardiomediastinal contours and hilar structures with signs of moderate cardiac enlargement similar to prior imaging accounting for AP projection. Lungs are clear.  No sign of pneumothorax. On limited assessment no acute skeletal process. IMPRESSION: Moderate cardiac enlargement. No acute cardiopulmonary disease. Electronically Signed   By: Zetta Bills M.D.   On: 07/21/2022 13:47   VAS [name] LOWER EXTREMITY VENOUS (DVT)  Result Date: 07/04/2022  Lower Venous DVT Study Patient Name:  MAZZY SANTARELLI  Date of Exam:   07/03/2022 Medical Rec #: [id number]           Accession #:    [id number] Date of Birth: 14-May-1951           Patient Gender: F Patient Age:   71 years Exam Location:  [facility] Procedure:      VAS [name] LOWER EXTREMITY VENOUS (DVT) Referring Phys: Nuala Alpha  --------------------------------------------------------------------------------  Indications: Acute onset of pain in the left foot that has now radiated to the lower leg x 5 days. Patient reports some mild SOB.  Risk Factors: None identified obesity. Anticoagulation: Eliquis. Comparison Study: NA Performing Technologist: Sharlett Iles RVT  Examination Guidelines: A complete evaluation includes B-mode imaging, spectral Doppler, color Doppler, and power Doppler as needed of all accessible portions of each vessel. Bilateral testing is considered an integral part of a complete examination. Limited examinations for reoccurring indications may be performed as noted. The reflux portion of the exam is performed with the patient in reverse Trendelenburg.  +-----+---------------+---------+-----------+----------+--------------+ RIGHTCompressibilityPhasicitySpontaneityPropertiesThrombus Aging +-----+---------------+---------+-----------+----------+--------------+ CFV  Full                    Yes                                 +-----+---------------+---------+-----------+----------+--------------+  Right Technical Findings: Pulsatile venous flow noted in the CFV.  +---------+---------------+---------+-----------+----------+--------------+ LEFT     CompressibilityPhasicitySpontaneityPropertiesThrombus Aging +---------+---------------+---------+-----------+----------+--------------+ CFV      Full                    Yes                                 +---------+---------------+---------+-----------+----------+--------------+ SFJ      Full                    Yes                                 +---------+---------------+---------+-----------+----------+--------------+ FV Prox  Full                    Yes                                 +---------+---------------+---------+-----------+----------+--------------+ FV Mid   Full                                                         +---------+---------------+---------+-----------+----------+--------------+ FV DistalFull                    Yes                                 +---------+---------------+---------+-----------+----------+--------------+ PFV      Full                    Yes                                 +---------+---------------+---------+-----------+----------+--------------+ POP      Full                    Yes                                 +---------+---------------+---------+-----------+----------+--------------+ PTV      Full                                                        +---------+---------------+---------+-----------+----------+--------------+ PERO     Full                                                        +---------+---------------+---------+-----------+----------+--------------+ Gastroc  Full                                                        +---------+---------------+---------+-----------+----------+--------------+  GSV      Full                    Yes                                 +---------+---------------+---------+-----------+----------+--------------+   Left Technical Findings: Pulsatile venous flow throughout the lower extremity.   Summary: RIGHT: - No evidence of common femoral vein obstruction.  LEFT: - No evidence of deep vein thrombosis in the lower extremity. No indirect evidence of obstruction proximal to the inguinal ligament. - No cystic structure found in the popliteal fossa.  - Pulsatile lower limb venous Doppler waveforms are noted which correlates well with increase right atrium pressure; right-sided heart failure.  *See table(s) above for measurements and observations. Electronically signed by Carlyle Dolly MD on 07/04/2022 at 9:59:33 AM.    Final    US Renal  Result Date: 06/29/2022 CLINICAL DATA:  Flank pain and fatigue EXAM: RENAL / URINARY TRACT ULTRASOUND COMPLETE COMPARISON:  None Available. FINDINGS: Right Kidney:  Renal measurements: 11.4 x 4.3 x 5.6 cm = volume: 142.7 mL. Echogenicity within normal limits. No mass or hydronephrosis visualized. Left Kidney: Renal measurements: 10.8 x 5.1 x 5.1 cm = volume: 149.2 mL. Echogenicity within normal limits. No mass or hydronephrosis visualized. Note is made of a exophytic 3.0 x 3.3 x 3.3 cm cyst off the inferior pole. Bladder: Appears normal for degree of bladder distention. Other: None. IMPRESSION: 1. No hydronephrosis. 2. Left renal cyst. Electronically Signed   By: Lovey Newcomer M.D.   On: 06/29/2022 06:47      Today   Subjective    Aldona Lento today has no headache,no chest abdominal pain,no new weakness tingling or numbness, feels much better wants to go home today.    Objective   Blood pressure 100/75, pulse 94, temperature 98 F (36.7 C), temperature source Oral, resp. rate 18, height 5' 6.5" (1.689 m), weight 132 kg, SpO2 99 %.   Intake/Output Summary (Last 24 hours) at 07/22/2022 1144 Last data filed at 07/22/2022 0500 Gross per 24 hour  Intake 1480 ml  Output 600 ml  Net 880 ml    Exam  Awake Alert, No new F.N deficits,    [place].AT,PERRAL Supple Neck,   Symmetrical Chest wall movement, Good air movement bilaterally, CTAB RRR,No Gallops,   +ve B.Sounds, Abd Soft, Non tender,  No Cyanosis, Clubbing or edema    Data Review   Recent Labs  Lab 07/21/22 1324 07/21/22 1335  WBC 7.2  --   HGB 12.8 13.6  HCT 41.8 40.0  PLT 220  --   MCV 97.4  --   MCH 29.8  --   MCHC 30.6  --   RDW 15.5  --   LYMPHSABS 1.0  --   MONOABS 0.5  --   EOSABS 0.2  --   BASOSABS 0.0  --     Recent Labs  Lab 07/21/22 1324 07/21/22 1335 07/22/22 0424  NA 141 139  --   K 4.0 3.9  --   CL 101 99  --   CO2 31  --   --   GLUCOSE 120* 117*  --   BUN 15 16  --   CREATININE 1.15* 1.10*  --   CALCIUM 9.1  --   --   AST 19  --   --   ALT 15  --   --  ALKPHOS 92  --   --   BILITOT 1.1  --   --   ALBUMIN 3.5  --   --   INR 1.5*  --   --   TSH  --    --  1.749    Total Time in preparing paper work, data evaluation and todays exam - 50 minutes  Lala Lund M.D on 07/22/2022 at 11:44 AM  [facility]

## 2022-07-22 NOTE — Evaluation (Signed)
Occupational Therapy Evaluation Patient Details Name: Kathy [name] MRN: [id number] DOB: 08-07-1951 Today's Date: 07/22/2022   History of Present Illness 71 y.o. female with medical history significant of anxiety/depression; chronic diastolic CHF; HTN; morbid obesity; OSA on CPAP; and afib presenting with dizziness. [place] and hit her head, admitted 06/20/22.   Clinical Impression   Pt reports PTA, she was independent with ADL/IADL and functional mobility with use of spc. She reports her sons are available to assist her as needed at d/c. Pt currently requires supervision to minguard assistance with functional mobility at RW level. She demonstrates below baseline functioning 2/2 dizziness, headache and pain. At this time, recommend d/c home with support from her sons and follow-up OT services. Pt with anticipated d/c this date. All additional OT needs to be addressed at next venue.       Recommendations for follow up therapy are one component of a multi-disciplinary discharge planning process, led by the attending physician.  Recommendations may be updated based on patient status, additional functional criteria and insurance authorization.   Follow Up Recommendations  Home health OT    Assistance Recommended at Discharge Frequent or constant Supervision/Assistance  Patient can return home with the following A little help with walking and/or transfers;A little help with bathing/dressing/bathroom;Assistance with cooking/housework    Functional Status Assessment  Patient has had a recent decline in their functional status and demonstrates the ability to make significant improvements in function in a reasonable and predictable amount of time.  Equipment Recommendations  None recommended by OT    Recommendations for Other Services       Precautions / Restrictions Precautions Precautions: Fall Restrictions Weight Bearing Restrictions: No      Mobility Bed Mobility Overal bed  mobility: Needs Assistance Bed Mobility: Supine to Sit, Sit to Supine     Supine to sit: Supervision Sit to supine: Supervision        Transfers Overall transfer level: Needs assistance Equipment used: Rolling walker (2 wheels) Transfers: Sit to/from Stand Sit to Stand: Supervision           General transfer comment: supervision for safety, no assist needed. increased time and effort      Balance Overall balance assessment: Needs assistance Sitting-balance support: No upper extremity supported, Feet supported Sitting balance-Leahy Scale: Good     Standing balance support: No upper extremity supported Standing balance-Leahy Scale: Fair                             ADL either performed or assessed with clinical judgement   ADL Overall ADL's : Needs assistance/impaired Eating/Feeding: Set up;Sitting   Grooming: Min guard;Standing   Upper Body Bathing: Set up;Sitting   Lower Body Bathing: Supervison/ safety;Sit to/from stand   Upper Body Dressing : Set up;Sitting   Lower Body Dressing: Supervision/safety;Sit to/from stand   Toilet Transfer: Min guard;Ambulation   Toileting- Clothing Manipulation and Hygiene: Min guard;Sit to/from stand       Functional mobility during ADLs: Min guard;Rolling walker (2 wheels) General ADL Comments: pt limited by dizziness, headache, and back pain     Vision Baseline Vision/History: 1 Wears glasses Ability to See in Adequate Light: 1 Impaired Patient Visual Report: Other (comment) ("decreased sharpness" "out of focus") Vision Assessment?: Yes Eye Alignment: Within Functional Limits Ocular Range of Motion: Within Functional Limits Alignment/Gaze Preference: Within Defined Limits Tracking/Visual Pursuits: Able to track stimulus in all quads without difficulty Saccades: Decreased speed of  saccadic movement (pt reports increased headache and dizziness with saccades) Convergence: Within functional limits Visual  Fields: No apparent deficits     Perception     Praxis      Pertinent Vitals/Pain Pain Assessment Pain Assessment: 0-10 Pain Score: 7  Pain Location: back and head Pain Descriptors / Indicators: Aching Pain Intervention(s): Limited activity within patient's tolerance, Monitored during session     Hand Dominance Right   Extremity/Trunk Assessment Upper Extremity Assessment Upper Extremity Assessment: Overall WFL for tasks assessed   Lower Extremity Assessment Lower Extremity Assessment: Defer to PT evaluation   Cervical / Trunk Assessment Cervical / Trunk Assessment: Normal   Communication Communication Communication: No difficulties   Cognition Arousal/Alertness: Awake/alert Behavior During Therapy: WFL for tasks assessed/performed Overall Cognitive Status: Within Functional Limits for tasks assessed                                       General Comments  poor SpO2 reading 2/2 pt with Raynauds    Exercises     Shoulder Instructions      Home Living Family/patient expects to be discharged to:: Private residence Living Arrangements: Alone Available Help at Discharge: Family;Available PRN/intermittently Type of Home: House Home Access: Stairs to enter [employer] of Steps: 2 Entrance Stairs-Rails: Right;Left Home Layout: Two level;Able to live on main level with bedroom/bathroom     Bathroom Shower/Tub: Occupational psychologist: Handicapped height Bathroom Accessibility: Yes How Accessible: Accessible via walker Home Equipment: [place] (2 wheels);Kasandra Knudsen - single point   Additional Comments: sons are available at d/c      Prior Functioning/Environment Prior Level of Function : Independent/Modified Independent;History of Falls (last six months)             Mobility Comments: Uses SPC          OT Problem List: Decreased activity tolerance;Impaired balance (sitting and/or standing);Obesity;Pain      OT  Treatment/Interventions: Self-care/ADL training;Therapeutic exercise;DME and/or AE instruction;Patient/family education;Balance training    OT Goals(Current goals can be found in the care plan section) Acute Rehab OT Goals Patient Stated Goal: to go home OT Goal Formulation: With patient Time For Goal Achievement: 08/05/22 Potential to Achieve Goals: Good  OT Frequency: Min 2X/week    Co-evaluation              AM-PAC OT "6 Clicks" Daily Activity     Outcome Measure Help from another person eating meals?: None Help from another person taking care of personal grooming?: A Little Help from another person toileting, which includes using toliet, bedpan, or urinal?: A Little Help from another person bathing (including washing, rinsing, drying)?: A Little Help from another person to put on and taking off regular upper body clothing?: None Help from another person to put on and taking off regular lower body clothing?: A Little 6 Click Score: 20   End of Session Equipment Utilized During Treatment: Rolling walker (2 wheels) Nurse Communication: Mobility status  Activity Tolerance: Patient tolerated treatment well;Patient limited by pain Patient left: in bed;with call bell/phone within reach;with bed alarm set  OT Visit Diagnosis: Other abnormalities of gait and mobility (R26.89);Muscle weakness (generalized) (M62.81);History of falling (Z91.81);Dizziness and giddiness (R42);Pain                Time: 6045-4098 OT Time Calculation (min): 27 min Charges:  OT General Charges $OT Visit: 1 Visit  OT Evaluation $OT Eval Moderate Complexity: 1 Mod OT Treatments $Self Care/Home Management : 8-22 mins  Helene Kelp OTR/L Acute Rehabilitation Services Office: [phone]   Wyn Forster 07/22/2022, 12:13 PM

## 2022-07-24 ENCOUNTER — Encounter (HOSPITAL_BASED_OUTPATIENT_CLINIC_OR_DEPARTMENT_OTHER): Payer: Self-pay | Admitting: Emergency Medicine

## 2022-07-24 ENCOUNTER — Telehealth: Payer: Self-pay

## 2022-07-24 ENCOUNTER — Emergency Department (HOSPITAL_BASED_OUTPATIENT_CLINIC_OR_DEPARTMENT_OTHER): Payer: Medicare Other

## 2022-07-24 ENCOUNTER — Emergency Department (HOSPITAL_BASED_OUTPATIENT_CLINIC_OR_DEPARTMENT_OTHER)
Admission: EM | Admit: 2022-07-24 | Discharge: 2022-07-24 | Disposition: A | Discharge status: Home / Self Care | Payer: Medicare Other | Attending: Emergency Medicine | Admitting: Emergency Medicine

## 2022-07-24 ENCOUNTER — Other Ambulatory Visit: Payer: Self-pay

## 2022-07-24 DIAGNOSIS — I509 Heart failure, unspecified: Secondary | ICD-10-CM | POA: Insufficient documentation

## 2022-07-24 DIAGNOSIS — Z7901 Long term (current) use of anticoagulants: Secondary | ICD-10-CM | POA: Insufficient documentation

## 2022-07-24 DIAGNOSIS — R350 Frequency of micturition: Secondary | ICD-10-CM | POA: Insufficient documentation

## 2022-07-24 DIAGNOSIS — I110 Hypertensive heart disease with heart failure: Secondary | ICD-10-CM | POA: Insufficient documentation

## 2022-07-24 DIAGNOSIS — K2980 Duodenitis without bleeding: Secondary | ICD-10-CM | POA: Diagnosis not present

## 2022-07-24 DIAGNOSIS — Z79899 Other long term (current) drug therapy: Secondary | ICD-10-CM | POA: Insufficient documentation

## 2022-07-24 DIAGNOSIS — R109 Unspecified abdominal pain: Secondary | ICD-10-CM

## 2022-07-24 LAB — COMPREHENSIVE METABOLIC PANEL
ALT: 14 U/L (ref 0–44)
AST: 20 U/L (ref 15–41)
Albumin: 4.2 g/dL (ref 3.5–5.0)
Alkaline Phosphatase: 77 U/L (ref 38–126)
Anion gap: 12 (ref 5–15)
BUN: 28 mg/dL — ABNORMAL HIGH (ref 8–23)
CO2: 26 mmol/L (ref 22–32)
Calcium: 9.2 mg/dL (ref 8.9–10.3)
Chloride: 98 mmol/L (ref 98–111)
Creatinine, Ser: 1.32 mg/dL — ABNORMAL HIGH (ref 0.44–1.00)
GFR, Estimated: 43 mL/min — ABNORMAL LOW (ref >60)
Glucose, Bld: 94 mg/dL (ref 70–99)
Potassium: 4 mmol/L (ref 3.5–5.1)
Sodium: 136 mmol/L (ref 135–145)
Total Bilirubin: 1.2 mg/dL (ref 0.3–1.2)
Total Protein: 6.8 g/dL (ref 6.5–8.1)

## 2022-07-24 LAB — CBC
HCT: 40.9 % (ref 36.0–46.0)
Hemoglobin: 12.6 g/dL (ref 12.0–15.0)
MCH: 29.7 pg (ref 26.0–34.0)
MCHC: 30.8 g/dL (ref 30.0–36.0)
MCV: 96.5 fL (ref 80.0–100.0)
Platelets: 213 10*3/uL (ref 150–400)
RBC: 4.24 MIL/uL (ref 3.87–5.11)
RDW: 15.7 % — ABNORMAL HIGH (ref 11.5–15.5)
WBC: 9.3 10*3/uL (ref 4.0–10.5)
nRBC: 0 % (ref 0.0–0.2)

## 2022-07-24 LAB — URINALYSIS, ROUTINE W REFLEX MICROSCOPIC
Glucose, UA: NEGATIVE mg/dL
Ketones, ur: NEGATIVE mg/dL
Nitrite: NEGATIVE
Protein, ur: >300 mg/dL — AB
Specific Gravity, Urine: >1.03 — ABNORMAL HIGH (ref 1.005–1.030)
pH: 5.5 (ref 5.0–8.0)

## 2022-07-24 LAB — URINALYSIS, MICROSCOPIC (REFLEX)

## 2022-07-24 LAB — LIPASE, BLOOD: Lipase: 32 U/L (ref 11–51)

## 2022-07-24 MED ORDER — FAMOTIDINE 20 MG PO TABS
20.0000 mg | ORAL_TABLET | Freq: Once | ORAL | Status: AC
  Administered 2022-07-24: 20 mg via ORAL
  Filled 2022-07-24: qty 1

## 2022-07-24 MED ORDER — KETOROLAC TROMETHAMINE 15 MG/ML IJ SOLN
15.0000 mg | Freq: Once | INTRAMUSCULAR | Status: AC
  Administered 2022-07-24: 15 mg via INTRAMUSCULAR
  Filled 2022-07-24: qty 1

## 2022-07-24 MED ORDER — KETOROLAC TROMETHAMINE 30 MG/ML IJ SOLN
15.0000 mg | Freq: Once | INTRAMUSCULAR | Status: DC
  Filled 2022-07-24: qty 1

## 2022-07-24 MED ORDER — CIPROFLOXACIN HCL 500 MG PO TABS: 500.0000 mg | ORAL_TABLET | Freq: Two times a day (BID) | ORAL | 0 refills | Status: DC

## 2022-07-24 MED ORDER — FUROSEMIDE 10 MG/ML IJ SOLN
40.0000 mg | Freq: Once | INTRAMUSCULAR | Status: DC
  Filled 2022-07-24: qty 4

## 2022-07-24 MED ORDER — LIDOCAINE VISCOUS HCL 2 % MT SOLN
15.0000 mL | Freq: Once | OROMUCOSAL | Status: AC
  Administered 2022-07-24: 15 mL via ORAL
  Filled 2022-07-24: qty 15

## 2022-07-24 MED ORDER — FUROSEMIDE 40 MG PO TABS
40.0000 mg | ORAL_TABLET | Freq: Once | ORAL | Status: AC
  Administered 2022-07-24: 40 mg via ORAL
  Filled 2022-07-24: qty 1

## 2022-07-24 MED ORDER — ALUM & MAG HYDROXIDE-SIMETH 200-200-20 MG/5ML PO SUSP
30.0000 mL | Freq: Once | ORAL | Status: AC
  Administered 2022-07-24: 30 mL via ORAL
  Filled 2022-07-24: qty 30

## 2022-07-24 MED ORDER — OMEPRAZOLE 20 MG PO CPDR: 40.0000 mg | DELAYED_RELEASE_CAPSULE | Freq: Every day | ORAL | 0 refills | Status: DC

## 2022-07-24 NOTE — Telephone Encounter (Signed)
Transition Care Management Unsuccessful Follow-up Telephone Call  Date of discharge and from where:  [place] 07-22-22 Dx: dizziness  Attempts:  1st Attempt  Reason for unsuccessful TCM follow-up call:  Left voice message   Juanda Crumble LPN [place] Direct Dial [phone]  Transition Care Management Unsuccessful Follow-up Telephone Call  Date of discharge and from where:  [place] 07-22-22 Dx: dizziness   Attempts:  2nd Attempt  Reason for unsuccessful TCM follow-up call:  Left voice message   Juanda Crumble LPN [place] Direct Dial [phone]   Transition Care Management Unsuccessful Follow-up Telephone Call  Date of discharge and from where:    [place] 07-22-22 Dx: dizziness    Attempts:  3rd Attempt  Reason for unsuccessful TCM follow-up call:  Left voice message   Juanda Crumble LPN [place] Direct Dial [phone]

## 2022-07-24 NOTE — ED Provider Notes (Signed)
Kathy [name] Provider Note   CSN: [id number] Arrival date & time: 07/24/22  1303     History  Chief Complaint  Patient presents with   Back Pain   Abdominal Pain    Kathy [name] is a 71 y.o. female.   Back Pain Associated symptoms: abdominal pain   Abdominal Pain   71 year old female presents emergency department with complaints of paraspinal as well as right-sided abdominal pain.  Patient states that she has a 5 to 6-year history of back pain at the exact same nature.  She reports no acute changes.  Denies saddle esthesia, bowel/bladder dysfunction, fever, weakness or sensory deficits in lower extremities, history of IV drug use, prolonged steroid use.  She reports radiating pain to from her right lower back to her right lower quadrant earlier today coupled with some feelings of urinary retention/frequency.  Denies hematuria, dysuria.  Denies chest pain, shortness of breath nausea, vomiting, vaginal symptoms, change in bowel habits.  She reports 9 pound weight gain over the past 6-7 days secondary to not taking her lasix.    PMH significant for paroxysmal atrial fibrillation on, CHF, DVT, hypertension, pulmonary hypertension, GERD, fibromyalgia, malignancy of melanoma,  Home Medications Prior to Admission medications   Medication Sig Start Date End Date Taking? Authorizing Provider  ciprofloxacin (CIPRO) 500 MG tablet Take 1 tablet (500 mg total) by mouth every 12 (twelve) hours. 07/24/22  Yes Dion Saucier A, PA  omeprazole (PRILOSEC) 20 MG capsule Take 2 capsules (40 mg total) by mouth daily. 07/24/22  Yes Dion Saucier A, PA  ALPRAZolam (XANAX) 1 MG tablet TAKE 1 TABLET BY MOUTH THREE TIMES A DAY AS NEEDED FOR ANXIETY Patient taking differently: Take 1 mg by mouth 3 (three) times daily as needed for anxiety. 06/06/22   Isaac Bliss, Rayford Halsted, MD  apixaban (ELIQUIS) 5 MG TABS tablet Take 1 tablet (5 mg total) by mouth 2 (two) times daily.  05/09/22   Isaac Bliss, Rayford Halsted, MD  atorvastatin (LIPITOR) 40 MG tablet Take 1 tablet (40 mg total) by mouth daily. 12/05/21   Nahser, Wonda Cheng, MD  Calcium Carbonate (CALCIUM 600 PO) Take 1 tablet by mouth daily.    [provider]  clindamycin (CLEOCIN T) 1 % lotion Apply 1 application topically 2 (two) times daily as needed (rosacea (skin irritation)).     [provider]  clobetasol (TEMOVATE) 0.05 % external solution Apply 1 application topically daily as needed (rosacea).    [provider]  diltiazem (CARDIZEM CD) 120 MG 24 hr capsule Take 1 capsule (120 mg total) by mouth daily. 12/05/21   Nahser, Wonda Cheng, MD  fluocinonide (LIDEX) 0.05 % external solution Apply 1 application topically daily as needed (rosacea).    [provider]  FLUoxetine (PROZAC) 40 MG capsule TAKE 1 CAPSULE BY MOUTH EVERY DAY Patient taking differently: Take 40 mg by mouth daily. 07/10/22   Isaac Bliss, Rayford Halsted, MD  furosemide (LASIX) 40 MG tablet TAKE 1 TABLET BY MOUTH DAILY. PLEASE KEEP UPCOMING APPOINTMENT IN JUNE 2023 FOR FUTURE REFILLS. Patient taking differently: Take 40 mg by mouth daily. 03/28/22   Elgie Collard, PA-C  HYDROcodone-acetaminophen (NORCO/VICODIN) 5-325 MG tablet Take 1 tablet by mouth every 6 (six) hours as needed for moderate pain. 06/06/22   Isaac Bliss, Rayford Halsted, MD  methocarbamol (ROBAXIN) 500 MG tablet Take 500 mg by mouth every 8 (eight) hours as needed. 05/26/22   [provider]  metoprolol succinate (TOPROL-XL) 100  MG 24 hr tablet TAKE 1 TABLET BY MOUTH EVERY DAY Patient taking differently: Take 100 mg by mouth daily. 05/05/22   Nahser, Wonda Cheng, MD  metroNIDAZOLE (METROCREAM) 0.75 % cream Apply 1 application topically 2 (two) times daily as needed (rosacea).     [provider]  Multiple Vitamin (MULTIVITAMIN WITH MINERALS) TABS tablet Take 1 tablet by mouth daily. Centrum Silver    [provider]  potassium  chloride (KLOR-CON) 10 MEQ tablet Take 1 tablet (10 mEq total) by mouth daily. 12/05/21   Nahser, Wonda Cheng, MD      Allergies    Achromycin [tetracycline hcl], Penicillins, Sulfamethoxazole-trimethoprim, Nickel, and Zanaflex [tizanidine hydrochloride]    Review of Systems   Review of Systems  Gastrointestinal:  Positive for abdominal pain.  Musculoskeletal:  Positive for back pain.  All other systems reviewed and are negative.   Physical Exam Updated Vital Signs BP (!) 142/89 (BP Location: Right Arm)   Pulse 86   Temp 98.7 F (37.1 C) (Oral)   Resp 16   Ht 5' 6.5" (1.689 m)   Wt (!) 140.4 kg   SpO2 95%   BMI 49.21 kg/m  Physical Exam Vitals and nursing note reviewed.  Constitutional:      General: She is not in acute distress.    Appearance: She is well-developed.  HENT:     Head: Normocephalic and atraumatic.  Eyes:     Conjunctiva/sclera: Conjunctivae normal.  Cardiovascular:     Rate and Rhythm: Normal rate and regular rhythm.     Heart sounds: No murmur heard. Pulmonary:     Effort: Pulmonary effort is normal. No respiratory distress.     Breath sounds: Normal breath sounds.  Abdominal:     General: There is distension.     Palpations: Abdomen is soft.     Tenderness: There is right CVA tenderness.  Musculoskeletal:        General: No swelling.     Cervical back: Neck supple.     Comments: Patient has bilateral lower extremity swelling of which she states is chronic and unchanged in nature acutely.  No overlying skin abnormalities noted  Skin:    General: Skin is warm and dry.     Capillary Refill: Capillary refill takes less than 2 seconds.  Neurological:     Mental Status: She is alert.  Psychiatric:        Mood and Affect: Mood normal.     ED Results / Procedures / Treatments   Labs (all labs ordered are listed, but only abnormal results are displayed) Labs Reviewed  COMPREHENSIVE METABOLIC PANEL - Abnormal; Notable for the following components:       Result Value   BUN 28 (*)    Creatinine, Ser 1.32 (*)    GFR, Estimated 43 (*)    All other components within normal limits  CBC - Abnormal; Notable for the following components:   RDW 15.7 (*)    All other components within normal limits  URINALYSIS, ROUTINE W REFLEX MICROSCOPIC - Abnormal; Notable for the following components:   Color, Urine YELLOW (*)    APPearance HAZY (*)    Specific Gravity, Urine >1.030 (*)    Hgb urine dipstick TRACE (*)    Bilirubin Urine MODERATE (*)    Protein, ur >300 (*)    Leukocytes,Ua TRACE (*)    All other components within normal limits  URINALYSIS, MICROSCOPIC (REFLEX) - Abnormal; Notable for the following components:   Bacteria, UA  FEW (*)    All other components within normal limits  URINE CULTURE  LIPASE, BLOOD    EKG None  Radiology CT Renal Stone Study  Result Date: 07/24/2022 CLINICAL DATA:  Flank and back pain, abdominal pain, question RIGHT kidney stone EXAM: CT ABDOMEN AND PELVIS WITHOUT CONTRAST TECHNIQUE: Multidetector CT imaging of the abdomen and pelvis was performed following the standard protocol without IV contrast. RADIATION DOSE REDUCTION: This exam was performed according to the departmental dose-optimization program which includes automated exposure control, adjustment of the mA and/or kV according to patient size and/or use of iterative reconstruction technique. COMPARISON:  07/21/2022 FINDINGS: Lower chest: Lung bases clear Hepatobiliary: Gallbladder surgically absent.  Liver unremarkable. Pancreas: Normal appearance Spleen: Normal appearance. Small splenule inferior to splenic hilum. Adrenals/Urinary Tract: Adrenal glands normal appearance. Exophytic cyst posterior LEFT kidney 3.1 x 2.7 cm image 35, simple features by noncontrast CT, unchanged; no follow-up imaging recommended. Kidneys, ureters, and bladder otherwise normal appearance Stomach/Bowel: Distal colonic diverticulosis without evidence of diverticulitis. Normal  appendix. Ill-defined margin of the duodenum with borderline wall thickening and mild adjacent fluid in the RIGHT anterior pararenal space, question mild duodenitis. No extraluminal gas to suggest perforation. Stomach and remaining bowel loops unremarkable. Vascular/Lymphatic: Atherosclerotic calcifications aorta and iliac arteries as well as scattered visceral arteries. Aorta normal caliber. Upper normal sized LEFT para-aortic node 10 mm short axis image 32. Additional normal sized retroperitoneal nodes. Reproductive: Uterus surgically absent. Nonvisualization of ovaries. Other: Small amount of free fluid in pelvis. No free air. No definite hernia. Musculoskeletal: BILATERAL hip prostheses with beam hardening artifacts in pelvis. Osseous demineralization. Prior L1 compression fracture and vertebroplasty. IMPRESSION: Distal colonic diverticulosis without evidence of diverticulitis. Ill-defined margin of the duodenum with borderline wall thickening and mild adjacent fluid in the RIGHT anterior pararenal space, question mild duodenitis. No evidence of urinary tract calculus or obstruction. Aortic Atherosclerosis (ICD10-I70.0). Electronically Signed   By: Lavonia Dana M.D.   On: 07/24/2022 16:40    Procedures Procedures    Medications Ordered in ED Medications  ketorolac (TORADOL) 15 MG/ML injection 15 mg (15 mg Intramuscular Given 07/24/22 1633)  furosemide (LASIX) tablet 40 mg (40 mg Oral Given 07/24/22 1632)  famotidine (PEPCID) tablet 20 mg (20 mg Oral Given 07/24/22 1829)  alum & mag hydroxide-simeth (MAALOX/MYLANTA) 200-200-20 MG/5ML suspension 30 mL (30 mLs Oral Given 07/24/22 1827)    And  lidocaine (XYLOCAINE) 2 % viscous mouth solution 15 mL (15 mLs Oral Given 07/24/22 1827)    ED Course/ Medical Decision Making/ A&P                           Medical Decision Making Amount and/or Complexity of Data Reviewed Labs: ordered. Radiology: ordered.  Risk OTC drugs. Prescription drug  management.   This patient presents to the ED for concern of abdominal pain, this involves an extensive number of treatment options, and is a complaint that carries with it a high risk of complications and morbidity.  The differential diagnosis includes AAA, aortic dissection, appendicitis, nephrolithiasis, pyelonephritis, diverticulitis, volvulus, SBO/LBO, cholecystitis, hepatitis, ovarian torsion, malignancy, ischemic colitis/mesentery   Co morbidities that complicate the patient evaluation  See HPI   Additional history obtained:  Additional history obtained from EMR External records from outside source obtained and reviewed including hospital records   Lab Tests:  I Ordered, and personally interpreted labs.  The pertinent results include: No leukocytosis.  No evidence anemia.  Platelets within normal range.  No electrolyte abnormalities.  Renal function within patient's baseline with a GFR of 43, BUN of 28 and creatinine 1.32.  UA significant for trace leukocyte, few bacteria and 11-20 WBC of which patient has 6-10 squamous cells.  Patient currently complaining of urinary frequency   Imaging Studies ordered:  I ordered imaging studies including bladder ultrasound which showed 0 cc of fluid, CT renal stone study I independently visualized and interpreted imaging which showed colonic diverticulosis without evidence of diverticulitis.  Ill-defined margin of duodenum with borderline wall thickening with mild adjacent right anterior pararenal space fluid concerning for mild duodenitis.  No evidence of urinary tract calculus or obstruction.  Dark atherosclerosis. I agree with the radiologist interpretation  Cardiac Monitoring: / EKG:  The patient was maintained on a cardiac monitor.  I personally viewed and interpreted the cardiac monitored which showed an underlying rhythm of: Sinus rhythm   Consultations Obtained:  N/a   Problem List / ED Course / Critical interventions /  Medication management  Abdominal pain I ordered medication including Toradol for pain.  Lasix for diuresis.  Maalox, Xylocaine and Pepcid for GI cocktail.    Reevaluation of the patient after these medicines showed that the patient improved I have reviewed the patients home medicines and have made adjustments as needed   Social Determinants of Health:  Denies tobacco, illicit drug use   Test / Admission - Considered:  Abdominal pain Vitals signs significant for mild hypertension with a blood pressure 142/89.  Recommend close follow-up with PCP regarding elevation of blood pressure.. Otherwise within normal range and stable throughout visit. Laboratory/imaging studies significant for: See above Patient symptoms of right back pain seems chronic in nature with no acute changes as per patient.  Patient exhibiting no red flag signs of back pain; further imaging deemed unnecessary at this time.  Patient's abdominal pain likely secondary to urinary tract infection given suprapubic tenderness/HPI/UA as well as evidence of duodenitis as seen on CT stone study.  Recommended liquid/pured type diet as well as avoidance of NSAIDs.  Follow-up with PCP/GI recommended for reevaluation of symptoms.  Treatment plan discussed at length with patient she knowledge understanding was agreeable to said plan. Worrisome signs and symptoms were discussed with the patient, and the patient acknowledged understanding to return to the ED if noticed. Patient was stable upon discharge.          Final Clinical Impression(s) / ED Diagnoses Final diagnoses:  Abdominal pain, unspecified abdominal location  Duodenitis    Rx / DC Orders ED Discharge Orders          Ordered    omeprazole (PRILOSEC) 20 MG capsule  Daily        07/24/22 1751    ciprofloxacin (CIPRO) 500 MG tablet  Every 12 hours        07/24/22 2000              Wilnette Kales, [place] 07/24/22 2356    Tretha Sciara, MD 07/26/22  [phone]

## 2022-07-24 NOTE — ED Notes (Signed)
Pt verbalizes understanding of discharge instructions. Opportunity for questioning and answers were provided. Pt discharged from ED to home with family.    

## 2022-07-24 NOTE — ED Triage Notes (Signed)
Pt via pov from home with continued back pain, which has now moved to abdomen, since a head injury on Friday. She was seen and had a CT scan, which was negative. Pt has CHF and has gained 6# since then, but states she has not been taking her lasix normally. Pt also reports urinary difficulty today. Pt recently took a round of abx for UTI (2 weeks ago). Pt alert & oriented, nad noted.

## 2022-07-24 NOTE — Discharge Instructions (Signed)
Your work-up today was overall reassuring.  As discussed, findings concerning for duodenitis.  Recommend removal of NSAIDs such as Aleve, ibuprofen, naproxen, Mobic.  Also recommend starting a PPI as discussed.  Also recommend continuation of taking your at home medications.  Follow-up with gastroenterology outpatient for reevaluation.  Please do not hesitate to return to emergency department for worrisome signs and symptoms we discussed, parent.

## 2022-07-25 LAB — URINE CULTURE: Culture: 20000 — AB

## 2022-07-26 ENCOUNTER — Emergency Department (HOSPITAL_BASED_OUTPATIENT_CLINIC_OR_DEPARTMENT_OTHER): Payer: Medicare Other

## 2022-07-26 ENCOUNTER — Emergency Department (HOSPITAL_BASED_OUTPATIENT_CLINIC_OR_DEPARTMENT_OTHER): Payer: Medicare Other | Admitting: Radiology

## 2022-07-26 ENCOUNTER — Encounter (HOSPITAL_BASED_OUTPATIENT_CLINIC_OR_DEPARTMENT_OTHER): Payer: Self-pay

## 2022-07-26 ENCOUNTER — Other Ambulatory Visit: Payer: Self-pay

## 2022-07-26 ENCOUNTER — Observation Stay (HOSPITAL_COMMUNITY): Payer: Medicare Other

## 2022-07-26 ENCOUNTER — Encounter (HOSPITAL_COMMUNITY): Payer: Self-pay

## 2022-07-26 ENCOUNTER — Inpatient Hospital Stay (HOSPITAL_BASED_OUTPATIENT_CLINIC_OR_DEPARTMENT_OTHER)
Admission: EM | Admit: 2022-07-26 | Discharge: 2022-07-30 | DRG: 291 | Disposition: A | Discharge status: Home Health | Payer: Medicare Other | Attending: Internal Medicine | Admitting: Internal Medicine

## 2022-07-26 DIAGNOSIS — I071 Rheumatic tricuspid insufficiency: Secondary | ICD-10-CM | POA: Diagnosis not present

## 2022-07-26 DIAGNOSIS — N1831 Chronic kidney disease, stage 3a: Secondary | ICD-10-CM

## 2022-07-26 DIAGNOSIS — H9191 Unspecified hearing loss, right ear: Secondary | ICD-10-CM | POA: Diagnosis present

## 2022-07-26 DIAGNOSIS — I130 Hypertensive heart and chronic kidney disease with heart failure and stage 1 through stage 4 chronic kidney disease, or unspecified chronic kidney disease: Secondary | ICD-10-CM | POA: Diagnosis not present

## 2022-07-26 DIAGNOSIS — M5030 Other cervical disc degeneration, unspecified cervical region: Secondary | ICD-10-CM | POA: Diagnosis present

## 2022-07-26 DIAGNOSIS — I2729 Other secondary pulmonary hypertension: Secondary | ICD-10-CM | POA: Diagnosis not present

## 2022-07-26 DIAGNOSIS — Z86010 Personal history of colonic polyps: Secondary | ICD-10-CM

## 2022-07-26 DIAGNOSIS — K760 Fatty (change of) liver, not elsewhere classified: Secondary | ICD-10-CM | POA: Diagnosis present

## 2022-07-26 DIAGNOSIS — Z880 Allergy status to penicillin: Secondary | ICD-10-CM

## 2022-07-26 DIAGNOSIS — I5033 Acute on chronic diastolic (congestive) heart failure: Secondary | ICD-10-CM | POA: Diagnosis present

## 2022-07-26 DIAGNOSIS — Z79899 Other long term (current) drug therapy: Secondary | ICD-10-CM

## 2022-07-26 DIAGNOSIS — G8929 Other chronic pain: Secondary | ICD-10-CM | POA: Diagnosis not present

## 2022-07-26 DIAGNOSIS — G894 Chronic pain syndrome: Secondary | ICD-10-CM | POA: Diagnosis not present

## 2022-07-26 DIAGNOSIS — Z91199 Patient's noncompliance with other medical treatment and regimen due to unspecified reason: Secondary | ICD-10-CM

## 2022-07-26 DIAGNOSIS — Z6841 Body Mass Index (BMI) 40.0 and over, adult: Secondary | ICD-10-CM | POA: Diagnosis not present

## 2022-07-26 DIAGNOSIS — I872 Venous insufficiency (chronic) (peripheral): Secondary | ICD-10-CM | POA: Diagnosis not present

## 2022-07-26 DIAGNOSIS — Z87442 Personal history of urinary calculi: Secondary | ICD-10-CM

## 2022-07-26 DIAGNOSIS — R1011 Right upper quadrant pain: Secondary | ICD-10-CM | POA: Diagnosis not present

## 2022-07-26 DIAGNOSIS — Z8719 Personal history of other diseases of the digestive system: Secondary | ICD-10-CM

## 2022-07-26 DIAGNOSIS — I509 Heart failure, unspecified: Principal | ICD-10-CM

## 2022-07-26 DIAGNOSIS — M797 Fibromyalgia: Secondary | ICD-10-CM | POA: Diagnosis present

## 2022-07-26 DIAGNOSIS — Z85820 Personal history of malignant melanoma of skin: Secondary | ICD-10-CM | POA: Diagnosis not present

## 2022-07-26 DIAGNOSIS — Z9049 Acquired absence of other specified parts of digestive tract: Secondary | ICD-10-CM

## 2022-07-26 DIAGNOSIS — F419 Anxiety disorder, unspecified: Secondary | ICD-10-CM | POA: Diagnosis present

## 2022-07-26 DIAGNOSIS — Z8249 Family history of ischemic heart disease and other diseases of the circulatory system: Secondary | ICD-10-CM

## 2022-07-26 DIAGNOSIS — Z96643 Presence of artificial hip joint, bilateral: Secondary | ICD-10-CM | POA: Diagnosis present

## 2022-07-26 DIAGNOSIS — Z882 Allergy status to sulfonamides status: Secondary | ICD-10-CM

## 2022-07-26 DIAGNOSIS — F32A Depression, unspecified: Secondary | ICD-10-CM | POA: Diagnosis not present

## 2022-07-26 DIAGNOSIS — I480 Paroxysmal atrial fibrillation: Secondary | ICD-10-CM | POA: Diagnosis present

## 2022-07-26 DIAGNOSIS — E876 Hypokalemia: Secondary | ICD-10-CM | POA: Diagnosis present

## 2022-07-26 DIAGNOSIS — G4733 Obstructive sleep apnea (adult) (pediatric): Secondary | ICD-10-CM

## 2022-07-26 DIAGNOSIS — K219 Gastro-esophageal reflux disease without esophagitis: Secondary | ICD-10-CM | POA: Diagnosis present

## 2022-07-26 DIAGNOSIS — Z86718 Personal history of other venous thrombosis and embolism: Secondary | ICD-10-CM

## 2022-07-26 DIAGNOSIS — N183 Chronic kidney disease, stage 3 unspecified: Secondary | ICD-10-CM | POA: Diagnosis present

## 2022-07-26 DIAGNOSIS — R0902 Hypoxemia: Secondary | ICD-10-CM | POA: Diagnosis present

## 2022-07-26 DIAGNOSIS — Z87820 Personal history of traumatic brain injury: Secondary | ICD-10-CM

## 2022-07-26 DIAGNOSIS — K589 Irritable bowel syndrome without diarrhea: Secondary | ICD-10-CM | POA: Diagnosis present

## 2022-07-26 DIAGNOSIS — Z90710 Acquired absence of both cervix and uterus: Secondary | ICD-10-CM

## 2022-07-26 DIAGNOSIS — I5082 Biventricular heart failure: Secondary | ICD-10-CM | POA: Diagnosis not present

## 2022-07-26 DIAGNOSIS — M1990 Unspecified osteoarthritis, unspecified site: Secondary | ICD-10-CM | POA: Diagnosis present

## 2022-07-26 DIAGNOSIS — Z9109 Other allergy status, other than to drugs and biological substances: Secondary | ICD-10-CM

## 2022-07-26 DIAGNOSIS — Z881 Allergy status to other antibiotic agents status: Secondary | ICD-10-CM

## 2022-07-26 DIAGNOSIS — Z634 Disappearance and death of family member: Secondary | ICD-10-CM

## 2022-07-26 DIAGNOSIS — R0789 Other chest pain: Secondary | ICD-10-CM | POA: Diagnosis not present

## 2022-07-26 DIAGNOSIS — Z7901 Long term (current) use of anticoagulants: Secondary | ICD-10-CM

## 2022-07-26 DIAGNOSIS — Z888 Allergy status to other drugs, medicaments and biological substances status: Secondary | ICD-10-CM

## 2022-07-26 LAB — BASIC METABOLIC PANEL
Anion gap: 10 (ref 5–15)
BUN: 32 mg/dL — ABNORMAL HIGH (ref 8–23)
CO2: 30 mmol/L (ref 22–32)
Calcium: 9 mg/dL (ref 8.9–10.3)
Chloride: 97 mmol/L — ABNORMAL LOW (ref 98–111)
Creatinine, Ser: 1.38 mg/dL — ABNORMAL HIGH (ref 0.44–1.00)
GFR, Estimated: 41 mL/min — ABNORMAL LOW (ref >60)
Glucose, Bld: 108 mg/dL — ABNORMAL HIGH (ref 70–99)
Potassium: 3.8 mmol/L (ref 3.5–5.1)
Sodium: 137 mmol/L (ref 135–145)

## 2022-07-26 LAB — CBC
HCT: 38.5 % (ref 36.0–46.0)
Hemoglobin: 12 g/dL (ref 12.0–15.0)
MCH: 29.7 pg (ref 26.0–34.0)
MCHC: 31.2 g/dL (ref 30.0–36.0)
MCV: 95.3 fL (ref 80.0–100.0)
Platelets: 201 10*3/uL (ref 150–400)
RBC: 4.04 MIL/uL (ref 3.87–5.11)
RDW: 15.9 % — ABNORMAL HIGH (ref 11.5–15.5)
WBC: 6.3 10*3/uL (ref 4.0–10.5)
nRBC: 0 % (ref 0.0–0.2)

## 2022-07-26 LAB — URINALYSIS, ROUTINE W REFLEX MICROSCOPIC
Bilirubin Urine: NEGATIVE
Glucose, UA: NEGATIVE mg/dL
Hgb urine dipstick: NEGATIVE
Ketones, ur: NEGATIVE mg/dL
Nitrite: NEGATIVE
Protein, ur: NEGATIVE mg/dL
Specific Gravity, Urine: 1.017 (ref 1.005–1.030)
pH: 5 (ref 5.0–8.0)

## 2022-07-26 LAB — HEPATIC FUNCTION PANEL
ALT: 15 U/L (ref 0–44)
AST: 18 U/L (ref 15–41)
Albumin: 4.1 g/dL (ref 3.5–5.0)
Alkaline Phosphatase: 71 U/L (ref 38–126)
Bilirubin, Direct: 0.3 mg/dL — ABNORMAL HIGH (ref 0.0–0.2)
Indirect Bilirubin: 0.7 mg/dL (ref 0.3–0.9)
Total Bilirubin: 1 mg/dL (ref 0.3–1.2)
Total Protein: 6.7 g/dL (ref 6.5–8.1)

## 2022-07-26 LAB — TROPONIN I (HIGH SENSITIVITY)
Troponin I (High Sensitivity): 15 ng/L (ref <18)
Troponin I (High Sensitivity): 17 ng/L (ref <18)

## 2022-07-26 LAB — LIPASE, BLOOD: Lipase: 51 U/L (ref 11–51)

## 2022-07-26 LAB — BRAIN NATRIURETIC PEPTIDE: B Natriuretic Peptide: 271.1 pg/mL — ABNORMAL HIGH (ref 0.0–100.0)

## 2022-07-26 MED ORDER — PANTOPRAZOLE SODIUM 40 MG PO TBEC
80.0000 mg | DELAYED_RELEASE_TABLET | Freq: Every day | ORAL | Status: DC
  Administered 2022-07-27 – 2022-07-30 (×4): 80 mg via ORAL
  Filled 2022-07-26 (×5): qty 2

## 2022-07-26 MED ORDER — FLUOXETINE HCL 20 MG PO CAPS
40.0000 mg | ORAL_CAPSULE | Freq: Every day | ORAL | Status: DC
  Filled 2022-07-26: qty 2

## 2022-07-26 MED ORDER — ONDANSETRON HCL 4 MG/2ML IJ SOLN
4.0000 mg | Freq: Four times a day (QID) | INTRAMUSCULAR | Status: DC | PRN
  Filled 2022-07-26: qty 2

## 2022-07-26 MED ORDER — ALPRAZOLAM 0.5 MG PO TABS
1.0000 mg | ORAL_TABLET | Freq: Three times a day (TID) | ORAL | Status: DC | PRN
  Administered 2022-07-26 – 2022-07-29 (×3): 1 mg via ORAL
  Filled 2022-07-26 (×3): qty 2

## 2022-07-26 MED ORDER — APIXABAN 5 MG PO TABS
5.0000 mg | ORAL_TABLET | Freq: Two times a day (BID) | ORAL | Status: DC
  Administered 2022-07-26 – 2022-07-30 (×8): 5 mg via ORAL
  Filled 2022-07-26 (×8): qty 1

## 2022-07-26 MED ORDER — FUROSEMIDE 10 MG/ML IJ SOLN
40.0000 mg | Freq: Every day | INTRAMUSCULAR | Status: DC
  Administered 2022-07-27 – 2022-07-28 (×2): 40 mg via INTRAVENOUS
  Filled 2022-07-26 (×2): qty 4

## 2022-07-26 MED ORDER — SODIUM CHLORIDE 0.9% FLUSH: 3.0000 mL | INTRAVENOUS | Status: DC | PRN

## 2022-07-26 MED ORDER — IOHEXOL 300 MG/ML  SOLN
80.0000 mL | Freq: Once | INTRAMUSCULAR | Status: AC | PRN
  Administered 2022-07-26: 100 mL via INTRAVENOUS

## 2022-07-26 MED ORDER — METHOCARBAMOL 500 MG PO TABS: 500.0000 mg | ORAL_TABLET | Freq: Three times a day (TID) | ORAL | Status: DC | PRN

## 2022-07-26 MED ORDER — FUROSEMIDE 10 MG/ML IJ SOLN
20.0000 mg | Freq: Once | INTRAMUSCULAR | Status: AC
  Administered 2022-07-26: 20 mg via INTRAVENOUS
  Filled 2022-07-26: qty 2

## 2022-07-26 MED ORDER — FLUOXETINE HCL 20 MG PO CAPS
40.0000 mg | ORAL_CAPSULE | Freq: Every day | ORAL | Status: DC
  Administered 2022-07-27 – 2022-07-30 (×4): 40 mg via ORAL
  Filled 2022-07-26 (×4): qty 2

## 2022-07-26 MED ORDER — HYDROCODONE-ACETAMINOPHEN 5-325 MG PO TABS
1.0000 | ORAL_TABLET | Freq: Four times a day (QID) | ORAL | Status: DC | PRN
  Administered 2022-07-26 – 2022-07-29 (×3): 1 via ORAL
  Filled 2022-07-26 (×3): qty 1

## 2022-07-26 MED ORDER — ACETAMINOPHEN 325 MG PO TABS
650.0000 mg | ORAL_TABLET | ORAL | Status: DC | PRN
  Administered 2022-07-27: 650 mg via ORAL
  Filled 2022-07-26: qty 2

## 2022-07-26 MED ORDER — METOPROLOL SUCCINATE ER 100 MG PO TB24
100.0000 mg | ORAL_TABLET | Freq: Every day | ORAL | Status: DC
  Administered 2022-07-27 – 2022-07-30 (×4): 100 mg via ORAL
  Filled 2022-07-26 (×4): qty 1

## 2022-07-26 MED ORDER — ATORVASTATIN CALCIUM 40 MG PO TABS
40.0000 mg | ORAL_TABLET | Freq: Every day | ORAL | Status: DC
  Administered 2022-07-27 – 2022-07-28 (×2): 40 mg via ORAL
  Filled 2022-07-26 (×2): qty 1

## 2022-07-26 MED ORDER — SODIUM CHLORIDE 0.9% FLUSH
3.0000 mL | Freq: Two times a day (BID) | INTRAVENOUS | Status: DC
  Administered 2022-07-26 – 2022-07-29 (×6): 3 mL via INTRAVENOUS

## 2022-07-26 MED ORDER — DILTIAZEM HCL ER COATED BEADS 120 MG PO CP24
120.0000 mg | ORAL_CAPSULE | Freq: Every day | ORAL | Status: DC
  Administered 2022-07-26 – 2022-07-30 (×4): 120 mg via ORAL
  Filled 2022-07-26 (×5): qty 1

## 2022-07-26 MED ORDER — FUROSEMIDE 10 MG/ML IJ SOLN
40.0000 mg | Freq: Once | INTRAMUSCULAR | Status: AC
  Administered 2022-07-26: 40 mg via INTRAVENOUS
  Filled 2022-07-26: qty 4

## 2022-07-26 MED ORDER — POTASSIUM CHLORIDE CRYS ER 10 MEQ PO TBCR
10.0000 meq | EXTENDED_RELEASE_TABLET | Freq: Every day | ORAL | Status: DC
  Administered 2022-07-26: 10 meq via ORAL
  Filled 2022-07-26 (×5): qty 1

## 2022-07-26 MED ORDER — SODIUM CHLORIDE 0.9 % IV SOLN: 250.0000 mL | INTRAVENOUS | Status: DC | PRN

## 2022-07-26 NOTE — ED Triage Notes (Signed)
Pt c/o intermittent left sided chest pain that started last night. Pt reports worsening sob since last ngiht. Pt has hx of chf. States she is still retaining quite a bit of fluid.

## 2022-07-26 NOTE — ED Notes (Signed)
Called lab to add hepatic panel and lipase, spoke with [place].

## 2022-07-26 NOTE — Assessment & Plan Note (Addendum)
8 lbs up CHF pathway Lasix '40mg'$  IV daily BMP daily Tele monitor Last echo in 2020, will go ahead and order updated echo No ACE/ARB due to CKD

## 2022-07-26 NOTE — Progress Notes (Signed)
Pt refused CPAP qhs.  Pt states that she sleeps with the head of her bed elevated and does not wear her CPAP much anymore.

## 2022-07-26 NOTE — ED Notes (Signed)
Called lab to add-on magnesium. 

## 2022-07-26 NOTE — Assessment & Plan Note (Addendum)
Creat and BUN unchanged from ED visit x2 days ago, but slightly up since discharge. Monitor closely with diuresis. Stop cipro, culture showed no evidence of UTI

## 2022-07-26 NOTE — Progress Notes (Signed)
Plan of Care Note for accepted transfer   Patient: Kathy [name] MRN: [id number]   DOA: 07/26/2022  Facility requesting transfer: [facility] Requesting Provider: Dr. Truett Mainland Reason for transfer: HF exacerbation Facility course: 71 yo F w/ PMHx of anxiety/depression, chronic HFpEF, HTN, OSA, afib. Presenting with dyspnea and chest pain. W/u revealed elevated BNP, 8kg wt gain, volume overload (peripheral edema). EDP started lasix.   Plan of care: The patient is accepted for admission to Telemetry unit, at [facility].  While holding at [facility], medical decision making responsibilities remain with the [facility]. Upon arrival to [facility], [facility] will assume care. Thank you.   Author: Jonnie Finner, DO 07/26/2022  Check [email or web address] for on-call coverage.  Nursing staff, Please call [place] number on Amion as soon as patient's arrival, so appropriate admitting provider can evaluate the pt.

## 2022-07-26 NOTE — ED Provider Notes (Signed)
[facility] EMERGENCY DEPT Provider Note  CSN: [id number] Arrival date & time: 07/26/22 1010  Chief Complaint(s) Chest Pain and Shortness of Breath  HPI Kathy [name] is a 71 y.o. female with history of CHF, hypertension, pulmonary hypertension, obesity presenting to the emergency department with shortness of breath.  Patient reports shortness of breath since last night.  She also reports left-sided chest pain.  She reports nausea, no vomiting.  No syncope but reports lightheadedness.  No diaphoresis.  She reports the pain is not pleuritic.  Shortness of breath is worse with lying flat.  She has been compliant with her home diuretics.  She also reports abdominal pain, reports the pain is in the right upper quadrant and right flank.  She was seen in the emergency department 2 days ago.,  Diagnosed with possible urine infection and started on Cipro but her symptoms have persisted.   Past Medical History Past Medical History:  Diagnosis Date   Adenomatous colon polyp    2012   Anxiety and depression    Arthritis    Osteoarthritis, Dr Maureen Ralphs   Cancer [facility])    skin - left foot excised   Chronic diastolic CHF (congestive heart failure) (HCC)    DDD (degenerative disc disease), cervical    Deaf, right    DVT (deep venous thrombosis) ([place])    RLE DVT 08/2004, post right knee arthroscopy   Fibromyalgia    Dr Tobie Lords, Rheu   GERD (gastroesophageal reflux disease)    H/O hiatal hernia    Headache(784.0)    PAST HX MILD MIGRAINES - resolved per patient 08/18/20   History of kidney stones    passed stones   HTN (hypertension)    IBS (irritable bowel syndrome)    Internal hemorrhoid    Lumbar radiculopathy    Obesity    OSA on CPAP    Moderate with AHI 20/hr with nocturnal hypoxemia on CPAP   PAF (paroxysmal atrial fibrillation) ([place]) 06/2018   Pulmonary hypertension ([place])    Patient Active Problem List   Diagnosis Date Noted   Acute on chronic diastolic  (congestive) heart failure ([place]) 07/26/2022   Dizziness 07/21/2022   Chronic pain disorder 07/21/2022   CKD (chronic kidney disease) stage 3, GFR 30-59 ml/min (HCC) 10/12/2021   Chronic anticoagulation 08/30/2021   OSA on CPAP    Depression, recurrent ([place]) 05/06/2019   Persistent atrial fibrillation (HCC) 12/31/2018   Chronic diastolic CHF (congestive heart failure) ([place]) 12/31/2018   Bilateral leg edema 07/29/2018   Paroxysmal atrial fibrillation ([place]) 06/25/2018   Chest pain 06/25/2018   Anxiety and depression 06/25/2018   Difficulty walking 02/13/2017   Low back pain 02/13/2017   Allergic rhinitis 07/31/2016   Malignant melanoma ([place]) 08/31/2013   Other abnormal glucose 01/22/2013   Anemia, unspecified 01/22/2013   OA (osteoarthritis) of hip 03/18/2012   Personal history of DVT (deep vein thrombosis) 01/05/2012   History of colonic polyps 08/08/2011   ARTHRITIS, HIP 01/24/2010   PERSONAL HISTORY OF FAILED MODERATE SEDATION 07/21/2009   Morbid obesity ([place]) 04/05/2009   Esophageal reflux 02/25/2009   HEART VALVE DISEASE 12/28/2008   HEADACHE 04/21/2008   Essential hypertension 10/15/2007   LUMBAR RADICULOPATHY, RIGHT 10/15/2007   FATIGUE 10/15/2007   IRRITABLE BOWEL SYNDROME 07/23/2007   Fibromyalgia 07/23/2007   Home Medication(s) Prior to Admission medications   Medication Sig Start Date End Date Taking? Authorizing Provider  ALPRAZolam (XANAX) 1 MG tablet TAKE 1 TABLET BY MOUTH THREE TIMES A DAY  AS NEEDED FOR ANXIETY Patient taking differently: Take 1 mg by mouth 3 (three) times daily as needed for anxiety. 06/06/22   Isaac Bliss, Rayford Halsted, MD  apixaban (ELIQUIS) 5 MG TABS tablet Take 1 tablet (5 mg total) by mouth 2 (two) times daily. 05/09/22   Isaac Bliss, Rayford Halsted, MD  atorvastatin (LIPITOR) 40 MG tablet Take 1 tablet (40 mg total) by mouth daily. 12/05/21   Nahser, Wonda Cheng, MD  Calcium Carbonate (CALCIUM 600 PO) Take 1 tablet by mouth daily.    [provider]  ciprofloxacin (CIPRO) 500 MG tablet Take 1 tablet (500 mg total) by mouth every 12 (twelve) hours. 07/24/22   Wilnette Kales, PA  clindamycin (CLEOCIN T) 1 % lotion Apply 1 application topically 2 (two) times daily as needed (rosacea (skin irritation)).     [provider]  clobetasol (TEMOVATE) 0.05 % external solution Apply 1 application topically daily as needed (rosacea).    [provider]  diltiazem (CARDIZEM CD) 120 MG 24 hr capsule Take 1 capsule (120 mg total) by mouth daily. 12/05/21   Nahser, Wonda Cheng, MD  fluocinonide (LIDEX) 0.05 % external solution Apply 1 application topically daily as needed (rosacea).    [provider]  FLUoxetine (PROZAC) 40 MG capsule TAKE 1 CAPSULE BY MOUTH EVERY DAY Patient taking differently: Take 40 mg by mouth daily. 07/10/22   Isaac Bliss, Rayford Halsted, MD  furosemide (LASIX) 40 MG tablet TAKE 1 TABLET BY MOUTH DAILY. PLEASE KEEP UPCOMING APPOINTMENT IN JUNE 2023 FOR FUTURE REFILLS. Patient taking differently: Take 40 mg by mouth daily. 03/28/22   Elgie Collard, PA-C  HYDROcodone-acetaminophen (NORCO/VICODIN) 5-325 MG tablet Take 1 tablet by mouth every 6 (six) hours as needed for moderate pain. 06/06/22   Isaac Bliss, Rayford Halsted, MD  methocarbamol (ROBAXIN) 500 MG tablet Take 500 mg by mouth every 8 (eight) hours as needed. 05/26/22   [provider]  metoprolol succinate (TOPROL-XL) 100 MG 24 hr tablet TAKE 1 TABLET BY MOUTH EVERY DAY Patient taking differently: Take 100 mg by mouth daily. 05/05/22   Nahser, Wonda Cheng, MD  metroNIDAZOLE (METROCREAM) 0.75 % cream Apply 1 application topically 2 (two) times daily as needed (rosacea).     [provider]  Multiple Vitamin (MULTIVITAMIN WITH MINERALS) TABS tablet Take 1 tablet by mouth daily. Centrum Silver    [provider]  omeprazole (PRILOSEC) 20 MG capsule Take 2 capsules (40 mg total) by mouth daily. 07/24/22   Wilnette Kales, PA   potassium chloride (KLOR-CON) 10 MEQ tablet Take 1 tablet (10 mEq total) by mouth daily. 12/05/21   Nahser, Wonda Cheng, MD                                                                                                                                    Past Surgical History Past Surgical History:  Procedure Laterality Date  ABDOMINAL HYSTERECTOMY     dysfunctional menses   APPENDECTOMY     BREAST EXCISIONAL BIOPSY Right    CARDIAC CATHETERIZATION  06/26/2018   CHOLECYSTECTOMY     COLONOSCOPY  05/27/2002, 08/08/11   2003 diverticulosis, hemorrhoids 2012 same + cecal polyp   EYE SURGERY     KNEE ARTHROSCOPY  2008   right   KYPHOPLASTY Bilateral 08/19/2020   Procedure: KYPHOPLASTY LUMBAR ONE;  Surgeon: Vallarie Mare, MD;  Location: [facility];  Service: Neurosurgery;  Laterality: Bilateral;  posterior   LEFT HEART CATH AND CORONARY ANGIOGRAPHY N/A 06/26/2018   Procedure: LEFT HEART CATH AND CORONARY ANGIOGRAPHY;  Surgeon: Wellington Hampshire, MD;  Location: [facility];  Service: Cardiovascular;  Laterality: N/A;   MELANOMA EXCISION Left 07/2013   foot   TOTAL HIP ARTHROPLASTY  02/2010   left ; Dr Maureen Ralphs   TOTAL HIP ARTHROPLASTY  03/18/2012   Procedure: TOTAL HIP ARTHROPLASTY;  Surgeon: Gearlean Alf, MD;  Location: [facility];  Service: Orthopedics;  Laterality: Right;   UPPER GASTROINTESTINAL ENDOSCOPY  04/21/2009, 08/08/11   2010 gastritis ;2012 small hiatal hernia. Dr Carlean Purl   WRIST FRACTURE SURGERY Left    Family History Family History  Problem Relation Age of Onset   Heart attack Mother 70   COPD Mother    Breast cancer Mother 74   Hypertension Father    Diabetes Father    Heart failure Father 60   Breast cancer Paternal Aunt        cns mets   COPD Maternal Grandmother    Heart disease Paternal Grandmother        died 47   Diabetes Paternal Grandmother    Stroke Paternal Grandfather        in late 61s   Heart attack Brother 77   Colon cancer Neg Hx     Social  History Social History   Tobacco Use   Smoking status: Never   Smokeless tobacco: Never  Vaping Use   Vaping Use: Never used  Substance Use Topics   Alcohol use: Yes    Comment: occasional   Drug use: No   Allergies Achromycin [tetracycline hcl], Penicillins, Sulfamethoxazole-trimethoprim, Nickel, and Zanaflex [tizanidine hydrochloride]  Review of Systems Review of Systems  All other systems reviewed and are negative.   Physical Exam Vital Signs  I have reviewed the triage vital signs BP (!) 139/91 (BP Location: Right Arm)   Pulse 76   Temp 98.7 F (37.1 C) (Oral)   Resp 16   Ht 5' 6.5" (1.689 m)   Wt (!) 139.8 kg   SpO2 96%   BMI 49.00 kg/m  Physical Exam Vitals and nursing note reviewed.  Constitutional:      General: She is not in acute distress.    Appearance: She is well-developed.  HENT:     Head: Normocephalic and atraumatic.     Mouth/Throat:     Mouth: Mucous membranes are moist.  Eyes:     Pupils: Pupils are equal, round, and reactive to light.  Cardiovascular:     Rate and Rhythm: Normal rate and regular rhythm.     Heart sounds: No murmur heard. Pulmonary:     Effort: Pulmonary effort is normal. No respiratory distress.     Breath sounds: Examination of the right-lower field reveals rales. Examination of the left-lower field reveals rales. Rales present.  Abdominal:     General: Abdomen is flat.     Palpations: Abdomen is soft.  Tenderness: There is no abdominal tenderness.  Musculoskeletal:        General: No tenderness.     Right lower leg: Edema present.     Left lower leg: Edema present.  Skin:    General: Skin is warm and dry.  Neurological:     General: No focal deficit present.     Mental Status: She is alert. Mental status is at baseline.  Psychiatric:        Mood and Affect: Mood normal.        Behavior: Behavior normal.     ED Results and Treatments Labs (all labs ordered are listed, but only abnormal results are  displayed) Labs Reviewed  BASIC METABOLIC PANEL - Abnormal; Notable for the following components:      Result Value   Chloride 97 (*)    Glucose, Bld 108 (*)    BUN 32 (*)    Creatinine, Ser 1.38 (*)    GFR, Estimated 41 (*)    All other components within normal limits  CBC - Abnormal; Notable for the following components:   RDW 15.9 (*)    All other components within normal limits  BRAIN NATRIURETIC PEPTIDE - Abnormal; Notable for the following components:   B Natriuretic Peptide 271.1 (*)    All other components within normal limits  HEPATIC FUNCTION PANEL - Abnormal; Notable for the following components:   Bilirubin, Direct 0.3 (*)    All other components within normal limits  URINALYSIS, ROUTINE W REFLEX MICROSCOPIC - Abnormal; Notable for the following components:   Leukocytes,Ua SMALL (*)    All other components within normal limits  LIPASE, BLOOD  MAGNESIUM  TROPONIN I (HIGH SENSITIVITY)  TROPONIN I (HIGH SENSITIVITY)                                                                                                                          Radiology CT ABDOMEN PELVIS W CONTRAST  Result Date: 07/26/2022 CLINICAL DATA:  Abdominal pain.  Left-sided chest pain. EXAM: CT ABDOMEN AND PELVIS WITH CONTRAST TECHNIQUE: Multidetector CT imaging of the abdomen and pelvis was performed using the standard protocol following bolus administration of intravenous contrast. RADIATION DOSE REDUCTION: This exam was performed according to the departmental dose-optimization program which includes automated exposure control, adjustment of the mA and/or kV according to patient size and/or use of iterative reconstruction technique. CONTRAST:  11m OMNIPAQUE IOHEXOL 300 MG/ML  SOLN COMPARISON:  CT renal stone 07/25/2019 FINDINGS: Lower chest: Bibasilar atelectasis. Cardiomegaly. Reflux of contrast into the hepatic veins. Hepatobiliary: Liver has a normal contour. No focal hepatic lesions are visualized.  There is evidence of hepatic steatosis. Portal vein is contrast opacified. Gallbladder is surgically absent. Pancreas: No pancreatic ductal dilatation or surrounding inflammatory changes. Spleen: Normal in size without focal abnormality. Adrenals/Urinary Tract: Bilateral adrenal glands are normal in appearance without focal lesions. Bilateral kidneys are normal in size without evidence of hydronephrosis or definitive evidence of nephrolithiasis. There is a  hypodense renal lesion projecting posteriorly from the interpolar left kidney, compatible with a renal cyst, requiring no further follow-up. Stomach/Bowel: The stomach, small bowel, large bowel is normal in caliber without evidence of bowel obstruction. There is diverticulosis without diverticulitis. The appendix is not definitively visualized. Assessment of the pelvis is limited due to streak artifact from bilateral hip arthroplasties. Vascular/Lymphatic: Aortic atherosclerosis. There are a few prominent periaortic lymph nodes measuring up to 1.1 cm (series 3, image 31). There is mixed calcified and noncalcified plaque at the origin of the SMA. Reproductive: Uterus and adnexa are poorly visualized and incompletely assessed. Other: There is trace fluid in the bilateral pericolic gutters, likely related to patient's fluid status. Musculoskeletal: Patient is status post bilateral hip arthroplasties as well as a kyphoplasty at L1. IMPRESSION: 1. No intra-abdominal finding to explain acute abdominal pain. Trace ascites is likely related to patient's fluid status. 2. Cardiomegaly with reflux of contrast into the hepatic veins, suggestive of cardiac dysfunction. 3. Hepatic steatosis. Aortic Atherosclerosis (ICD10-I70.0). Electronically Signed   By: Marin Roberts M.D.   On: 07/26/2022 12:50   DG Chest 2 View  Result Date: 07/26/2022 CLINICAL DATA:  Chest pain and shortness of breath. History of atrial fibrillation and congestive heart failure. EXAM: CHEST - 2 VIEW  COMPARISON:  07/21/2022 FINDINGS: Examination is degraded due to patient body habitus. Grossly unchanged enlarged cardiac silhouette and mediastinal contours. There is persistent mild elevation/eventration of the right hemidiaphragm. No discrete focal airspace opacities. No pleural effusion or pneumothorax. No definite evidence of edema. No acute osseous abnormalities. IMPRESSION: Similar findings of cardiomegaly without superimposed acute cardiopulmonary disease on this body habitus degraded examination. Specifically, no definite evidence of edema. Electronically Signed   By: Sandi Mariscal M.D.   On: 07/26/2022 11:03    Pertinent labs & imaging results that were available during my care of the patient were reviewed by me and considered in my medical decision making (see MDM for details).  Medications Ordered in ED Medications  furosemide (LASIX) injection 20 mg (has no administration in time range)  furosemide (LASIX) injection 40 mg (40 mg Intravenous Given 07/26/22 1242)  iohexol (OMNIPAQUE) 300 MG/ML solution 80 mL (100 mLs Intravenous Contrast Given 07/26/22 1127)                                                                                                                                     Procedures .Critical Care  Performed by: Cristie Hem, MD Authorized by: Cristie Hem, MD   Critical care provider statement:    Critical care time (minutes):  30   Critical care was necessary to treat or prevent imminent or life-threatening deterioration of the following conditions:  Circulatory failure, cardiac failure and respiratory failure   Critical care was time spent personally by me on the following activities:  Development of treatment plan with patient or surrogate, discussions with consultants, evaluation of patient's response  to treatment, examination of patient, ordering and review of laboratory studies, ordering and review of radiographic studies, ordering and performing  treatments and interventions, pulse oximetry, re-evaluation of patient's condition and review of old charts   (including critical care time)  Medical Decision Making / ED Course   MDM:  71 year old female presenting to the emergency department with chest pain and shortness of breath.  Patient well-appearing, vitals reassuring.  Exam with some bibasilar crackles and lower extremity edema, positive JVD suggestive of volume overload.  Weight remains elevated from hospitalization 4 days ago.  Suspect mild acute CHF given edema and crackles on exam.  Lower concern for ACS, EKG not suggestive, troponin negative.  Doubt pneumothorax, pneumonia given chest x-ray and symptoms.  Will obtain CT abdomen pelvis given abdominal pain which is persisting, urinalysis from previous visit was indeterminate and culture not growing anything so far, was contaminated.  Differential includes congestive hepatopathy, pancreatitis, obstruction, pyelonephritis, abscess, or other intra-abdominal infection.  Give IV diuretics and reassess.  Clinical Course as of 07/26/22 1431  Wed Jul 26, 2022  1427 Urinalysis not concerning for urine infection today.  CT abdomen without acute finding other than signs of CHF, patient is having some hepatic vein reflux which may be contributing to her upper abdominal pain.  Given volume overloaded status, will admit for IV diuresis as patient has had significant weight gain since previous admission.  Discussed with the hospitalist who agrees with admission. [WS]    Clinical Course User Index [WS] Cristie Hem, MD     Additional history obtained: -Additional history obtained from family -External records from outside source obtained and reviewed including: Chart review including previous notes, labs, imaging, consultation notes including ED visit 2 days ago   Lab Tests: -I ordered, reviewed, and interpreted labs.   The pertinent results include:   Labs Reviewed  BASIC  METABOLIC PANEL - Abnormal; Notable for the following components:      Result Value   Chloride 97 (*)    Glucose, Bld 108 (*)    BUN 32 (*)    Creatinine, Ser 1.38 (*)    GFR, Estimated 41 (*)    All other components within normal limits  CBC - Abnormal; Notable for the following components:   RDW 15.9 (*)    All other components within normal limits  BRAIN NATRIURETIC PEPTIDE - Abnormal; Notable for the following components:   B Natriuretic Peptide 271.1 (*)    All other components within normal limits  HEPATIC FUNCTION PANEL - Abnormal; Notable for the following components:   Bilirubin, Direct 0.3 (*)    All other components within normal limits  URINALYSIS, ROUTINE W REFLEX MICROSCOPIC - Abnormal; Notable for the following components:   Leukocytes,Ua SMALL (*)    All other components within normal limits  LIPASE, BLOOD  MAGNESIUM  TROPONIN I (HIGH SENSITIVITY)  TROPONIN I (HIGH SENSITIVITY)     EKG   EKG Interpretation  Date/Time:  Wednesday July 26 2022 10:16:46 EST Ventricular Rate:  78 PR Interval:    QRS Duration: 106 QT Interval:  414 QTC Calculation: 471 R Axis:   58 Text Interpretation: Atrial fibrillation Low voltage QRS Abnormal ECG When compared with ECG of 21-Jul-2022 14:13, PREVIOUS ECG IS PRESENT No significant change since last tracing Confirmed by Garnette Gunner [phone]) on 07/26/2022 10:55:58 AM         Imaging Studies ordered: I ordered imaging studies including CT A/P, CXR On my interpretation imaging demonstrates trace ascites, hepatic  reflux I independently visualized and interpreted imaging. I agree with the radiologist interpretation   Medicines ordered and prescription drug management: Meds ordered this encounter  Medications   furosemide (LASIX) injection 40 mg   iohexol (OMNIPAQUE) 300 MG/ML solution 80 mL   furosemide (LASIX) injection 20 mg    -I have reviewed the patients home medicines and have made adjustments as  needed   Consultations Obtained: I requested consultation with the hospitalist,  and discussed lab and imaging findings as well as pertinent plan - they recommend: admission   Cardiac Monitoring: The patient was maintained on a cardiac monitor.  I personally viewed and interpreted the cardiac monitored which showed an underlying rhythm of: NSR  Social Determinants of Health:  Diagnosis or treatment significantly limited by social determinants of health: obesity   Reevaluation: After the interventions noted above, I reevaluated the patient and found that they have improved  Co morbidities that complicate the patient evaluation  Past Medical History:  Diagnosis Date   Adenomatous colon polyp    2012   Anxiety and depression    Arthritis    Osteoarthritis, Dr Maureen Ralphs   Cancer [facility])    skin - left foot excised   Chronic diastolic CHF (congestive heart failure) ([place])    DDD (degenerative disc disease), cervical    Deaf, right    DVT (deep venous thrombosis) ([place])    RLE DVT 08/2004, post right knee arthroscopy   Fibromyalgia    Dr Tobie Lords, Rheu   GERD (gastroesophageal reflux disease)    H/O hiatal hernia    Headache(784.0)    PAST HX MILD MIGRAINES - resolved per patient 08/18/20   History of kidney stones    passed stones   HTN (hypertension)    IBS (irritable bowel syndrome)    Internal hemorrhoid    Lumbar radiculopathy    Obesity    OSA on CPAP    Moderate with AHI 20/hr with nocturnal hypoxemia on CPAP   PAF (paroxysmal atrial fibrillation) ([place]) 06/2018   Pulmonary hypertension ([place])       Dispostion: Disposition decision including need for hospitalization was considered, and patient admitted to the hospital.    Final Clinical Impression(s) / ED Diagnoses Final diagnoses:  Acute congestive heart failure, unspecified heart failure type [facility])     This chart was dictated using voice recognition software.  Despite best efforts to proofread,  errors  can occur which can change the documentation meaning.    Cristie Hem, MD 07/26/22 1431

## 2022-07-26 NOTE — Assessment & Plan Note (Addendum)
Does not appear to be due to UTI: UA very questionable for UTI x2 days ago with just trace LE.  Looks more like protinuria to me.  Culture with 20k CFU of mixed flora. CT AP neg Pt is s/p cholecystectomy. LFTs nl. Unclear cause at this point. Will order RUQ [name] as next step in work up.

## 2022-07-26 NOTE — Assessment & Plan Note (Signed)
Cont cardizem, toprol, and eliquis.

## 2022-07-26 NOTE — H&P (Signed)
History and Physical    Patient: Kathy [name] NLG:[id number] DOB: 02/26/1951 DOA: 07/26/2022 DOS: the patient was seen and examined on 07/26/2022 PCP: Isaac Bliss, Rayford Halsted, MD  Patient coming from: Home  Chief Complaint:  Chief Complaint  Patient presents with   Chest Pain   Shortness of Breath   HPI: Kathy [name] is a 71 y.o. female with medical history significant of HFpEF, HTN, morbid obesity, OSA on CPAP, A.Fib on eliquis.  Pt recently in hospital 11/3-11/4 for dizziness, presyncope, concussion.  Felt to be secondary to starting neurontin.  Since discharge pt also RUQ pain, R flank pain.  Seen in ED 11/6, felt to have questionable UTI, started on cipro.  Symptoms continued, but now also has SOB onset since last night,  8lb wt gain since discharge.  Has orthopnea.  Is taking home diuretics.   Review of Systems: As mentioned in the history of present illness. All other systems reviewed and are negative. Past Medical History:  Diagnosis Date   Adenomatous colon polyp    2012   Anxiety and depression    Arthritis    Osteoarthritis, Dr Maureen Ralphs   Cancer [facility])    skin - left foot excised   Chronic diastolic CHF (congestive heart failure) (HCC)    DDD (degenerative disc disease), cervical    Deaf, right    DVT (deep venous thrombosis) ([place])    RLE DVT 08/2004, post right knee arthroscopy   Fibromyalgia    Dr Tobie Lords, Rheu   GERD (gastroesophageal reflux disease)    H/O hiatal hernia    Headache(784.0)    PAST HX MILD MIGRAINES - resolved per patient 08/18/20   History of kidney stones    passed stones   HTN (hypertension)    IBS (irritable bowel syndrome)    Internal hemorrhoid    Lumbar radiculopathy    Obesity    OSA on CPAP    Moderate with AHI 20/hr with nocturnal hypoxemia on CPAP   PAF (paroxysmal atrial fibrillation) ([place]) 06/2018   Pulmonary hypertension ([place])    Past Surgical History:  Procedure Laterality Date   ABDOMINAL  HYSTERECTOMY     dysfunctional menses   APPENDECTOMY     BREAST EXCISIONAL BIOPSY Right    CARDIAC CATHETERIZATION  06/26/2018   CHOLECYSTECTOMY     COLONOSCOPY  05/27/2002, 08/08/11   2003 diverticulosis, hemorrhoids 2012 same + cecal polyp   EYE SURGERY     KNEE ARTHROSCOPY  2008   right   KYPHOPLASTY Bilateral 08/19/2020   Procedure: KYPHOPLASTY LUMBAR ONE;  Surgeon: Vallarie Mare, MD;  Location: [place];  Service: Neurosurgery;  Laterality: Bilateral;  posterior   LEFT HEART CATH AND CORONARY ANGIOGRAPHY N/A 06/26/2018   Procedure: LEFT HEART CATH AND CORONARY ANGIOGRAPHY;  Surgeon: Wellington Hampshire, MD;  Location: [facility];  Service: Cardiovascular;  Laterality: N/A;   MELANOMA EXCISION Left 07/2013   foot   TOTAL HIP ARTHROPLASTY  02/2010   left ; Dr Maureen Ralphs   TOTAL HIP ARTHROPLASTY  03/18/2012   Procedure: TOTAL HIP ARTHROPLASTY;  Surgeon: Gearlean Alf, MD;  Location: [facility];  Service: Orthopedics;  Laterality: Right;   UPPER GASTROINTESTINAL ENDOSCOPY  04/21/2009, 08/08/11   2010 gastritis ;2012 small hiatal hernia. Dr Carlean Purl   WRIST FRACTURE SURGERY Left    Social History:  reports that she has never smoked. She has never used smokeless tobacco. She reports current alcohol use. She reports that she does not use  drugs.  Allergies  Allergen Reactions   Achromycin [Tetracycline Hcl] Shortness Of Breath    Dyspnea     Penicillins     Facial angioedema Did it involve swelling of the face/tongue/throat, SOB, or low BP? Yes Did it involve sudden or severe rash/hives, skin peeling, or any reaction on the inside of your mouth or nose? No Did you need to seek medical attention at a hospital or doctor's office? No When did it last happen?      30+ years If all above answers are "NO", may proceed with cephalosporin use.    Sulfamethoxazole-Trimethoprim Hives   Nickel Itching   Zanaflex [Tizanidine Hydrochloride]     Causes more pain     Family History   Problem Relation Age of Onset   Heart attack Mother 51   COPD Mother    Breast cancer Mother 12   Hypertension Father    Diabetes Father    Heart failure Father 36   Breast cancer Paternal Aunt        cns mets   COPD Maternal Grandmother    Heart disease Paternal Grandmother        died 53   Diabetes Paternal Grandmother    Stroke Paternal Grandfather        in late 28s   Heart attack Brother 5   Colon cancer Neg Hx     Prior to Admission medications   Medication Sig Start Date End Date Taking? Authorizing Provider  ALPRAZolam (XANAX) 1 MG tablet TAKE 1 TABLET BY MOUTH THREE TIMES A DAY AS NEEDED FOR ANXIETY Patient taking differently: Take 1 mg by mouth 3 (three) times daily as needed for anxiety. 06/06/22   Isaac Bliss, Rayford Halsted, MD  apixaban (ELIQUIS) 5 MG TABS tablet Take 1 tablet (5 mg total) by mouth 2 (two) times daily. 05/09/22   Isaac Bliss, Rayford Halsted, MD  atorvastatin (LIPITOR) 40 MG tablet Take 1 tablet (40 mg total) by mouth daily. 12/05/21   Nahser, Wonda Cheng, MD  Calcium Carbonate (CALCIUM 600 PO) Take 1 tablet by mouth daily.    [provider]  ciprofloxacin (CIPRO) 500 MG tablet Take 1 tablet (500 mg total) by mouth every 12 (twelve) hours. 07/24/22   Wilnette Kales, PA  clindamycin (CLEOCIN T) 1 % lotion Apply 1 application topically 2 (two) times daily as needed (rosacea (skin irritation)).     [provider]  clobetasol (TEMOVATE) 0.05 % external solution Apply 1 application topically daily as needed (rosacea).    [provider]  diltiazem (CARDIZEM CD) 120 MG 24 hr capsule Take 1 capsule (120 mg total) by mouth daily. 12/05/21   Nahser, Wonda Cheng, MD  fluocinonide (LIDEX) 0.05 % external solution Apply 1 application topically daily as needed (rosacea).    [provider]  FLUoxetine (PROZAC) 40 MG capsule TAKE 1 CAPSULE BY MOUTH EVERY DAY Patient taking differently: Take 40 mg by mouth daily. 07/10/22   Isaac Bliss, Rayford Halsted, MD  furosemide (LASIX) 40 MG tablet TAKE 1 TABLET BY MOUTH DAILY. PLEASE KEEP UPCOMING APPOINTMENT IN JUNE 2023 FOR FUTURE REFILLS. Patient taking differently: Take 40 mg by mouth daily. 03/28/22   Elgie Collard, PA-C  HYDROcodone-acetaminophen (NORCO/VICODIN) 5-325 MG tablet Take 1 tablet by mouth every 6 (six) hours as needed for moderate pain. 06/06/22   Isaac Bliss, Rayford Halsted, MD  methocarbamol (ROBAXIN) 500 MG tablet Take 500 mg by mouth every 8 (eight) hours as needed. 05/26/22  [provider]  metoprolol succinate (TOPROL-XL) 100 MG 24 hr tablet TAKE 1 TABLET BY MOUTH EVERY DAY Patient taking differently: Take 100 mg by mouth daily. 05/05/22   Nahser, Wonda Cheng, MD  metroNIDAZOLE (METROCREAM) 0.75 % cream Apply 1 application topically 2 (two) times daily as needed (rosacea).     [provider]  Multiple Vitamin (MULTIVITAMIN WITH MINERALS) TABS tablet Take 1 tablet by mouth daily. Centrum Silver    [provider]  omeprazole (PRILOSEC) 20 MG capsule Take 2 capsules (40 mg total) by mouth daily. 07/24/22   Wilnette Kales, PA  potassium chloride (KLOR-CON) 10 MEQ tablet Take 1 tablet (10 mEq total) by mouth daily. 12/05/21   Nahser, Wonda Cheng, MD    Physical Exam: Vitals:   07/26/22 1815 07/26/22 1836 07/26/22 1930 07/26/22 2048  BP: (!) 137/90  (!) 134/91 (!) 152/94  Pulse: 91  73 80  Resp: 19  (!) 23 (!) 21  Temp:  98.4 F (36.9 C)  97.9 F (36.6 C)  TempSrc:  Oral  Oral  SpO2: 96%  97% 92%  Weight:    (!) 136.7 kg  Height:    5' 6.5" (1.689 m)   Constitutional: NAD, calm, comfortable Eyes: PERRL, lids and conjunctivae normal ENMT: Mucous membranes are moist. Posterior pharynx clear of any exudate or lesions.Normal dentition.  Neck: normal, supple, no masses, no thyromegaly Respiratory: Rales B lower lungs Cardiovascular: Regular rate and rhythm, no murmurs / rubs / gallops. 2+ BLE edema. 2+ pedal pulses. No carotid bruits.   Abdomen: no tenderness, no masses palpated. No hepatosplenomegaly. Bowel sounds positive.  Musculoskeletal: no clubbing / cyanosis. No joint deformity upper and lower extremities. Good ROM, no contractures. Normal muscle tone.  Skin: no rashes, lesions, ulcers. No induration Neurologic: CN 2-12 grossly intact. Sensation intact, DTR normal. Strength 5/5 in all 4.  Psychiatric: Normal judgment and insight. Alert and oriented x 3. Normal mood.   Data Reviewed:    Urinalysis    Component Value Date/Time   COLORURINE YELLOW 07/26/2022 [place] 07/26/2022 1238   LABSPEC 1.017 07/26/2022 1238   PHURINE 5.0 07/26/2022 1238   GLUCOSEU NEGATIVE 07/26/2022 1238   HGBUR NEGATIVE 07/26/2022 1238   BILIRUBINUR NEGATIVE 07/26/2022 1238   BILIRUBINUR Positive 06/26/2022 1444   KETONESUR NEGATIVE 07/26/2022 1238   PROTEINUR NEGATIVE 07/26/2022 1238   UROBILINOGEN 0.2 06/26/2022 1444   UROBILINOGEN 1.0 03/28/2012 1649   NITRITE NEGATIVE 07/26/2022 1238   LEUKOCYTESUR SMALL (A) 07/26/2022 1238       Latest Ref Rng & Units 07/26/2022   10:25 AM 07/24/2022    1:37 PM 07/21/2022    1:35 PM  CBC  WBC 4.0 - 10.5 K/uL 6.3  9.3    Hemoglobin 12.0 - 15.0 g/dL 12.0  12.6  13.6   Hematocrit 36.0 - 46.0 % 38.5  40.9  40.0   Platelets 150 - 400 K/uL 201  213        Latest Ref Rng & Units 07/26/2022   10:25 AM 07/24/2022    1:37 PM 07/21/2022    1:35 PM  CMP  Glucose 70 - 99 mg/dL 108  94  117   BUN 8 - 23 mg/dL 32  28  16   Creatinine 0.44 - 1.00 mg/dL 1.38  1.32  1.10   Sodium 135 - 145 mmol/L 137  136  139   Potassium 3.5 - 5.1 mmol/L 3.8  4.0  3.9   Chloride 98 -  111 mmol/L 97  98  99   CO2 22 - 32 mmol/L 30  26    Calcium 8.9 - 10.3 mg/dL 9.0  9.2    Total Protein 6.5 - 8.1 g/dL 6.7  6.8    Total Bilirubin 0.3 - 1.2 mg/dL 1.0  1.2    Alkaline Phos 38 - 126 U/L 71  77    AST 15 - 41 U/L 18  20    ALT 0 - 44 U/L 15  14     CT AP: IMPRESSION: 1. No intra-abdominal finding  to explain acute abdominal pain. Trace ascites is likely related to patient's fluid status. 2. Cardiomegaly with reflux of contrast into the hepatic veins, suggestive of cardiac dysfunction. 3. Hepatic steatosis.  CXR: IMPRESSION: Similar findings of cardiomegaly without superimposed acute cardiopulmonary disease on this body habitus degraded examination. Specifically, no definite evidence of edema.  Assessment and Plan: * Acute on chronic diastolic (congestive) heart failure (HCC) 8 lbs up CHF pathway Lasix '40mg'$  IV daily BMP daily Tele monitor Last echo in 2020, will go ahead and order updated echo No ACE/ARB due to CKD  RUQ abdominal pain Does not appear to be due to UTI: UA very questionable for UTI x2 days ago with just trace LE.  Looks more like protinuria to me.  Culture with 20k CFU of mixed flora. CT AP neg Pt is s/p cholecystectomy. LFTs nl. Unclear cause at this point. Will order RUQ [name] as next step in work up.  CKD (chronic kidney disease) stage 3, GFR 30-59 ml/min (HCC) Creat and BUN unchanged from ED visit x2 days ago, but slightly up since discharge. Monitor closely with diuresis. Stop cipro, culture showed no evidence of UTI  Chronic pain disorder Cont home meds including PRN narcotic.  OSA on CPAP CPAP QHS.  Paroxysmal atrial fibrillation (HCC) Cont cardizem, toprol, and eliquis.  Morbid obesity ([place]) Follow with PCP for wt loss.      Advance Care Planning:   Code Status: Full Code  Consults: None  Family Communication: No family in room  Severity of Illness: The appropriate patient status for this patient is OBSERVATION. Observation status is judged to be reasonable and necessary in order to provide the required intensity of service to ensure the patient's safety. The patient's presenting symptoms, physical exam findings, and initial radiographic and laboratory data in the context of their medical condition is felt to place them at decreased  risk for further clinical deterioration. Furthermore, it is anticipated that the patient will be medically stable for discharge from the hospital within 2 midnights of admission.   Author: Etta Quill., DO 07/26/2022 8:56 PM  For on call review [email or web address].

## 2022-07-26 NOTE — Assessment & Plan Note (Signed)
Follow with PCP for wt loss.

## 2022-07-26 NOTE — Assessment & Plan Note (Signed)
Cont home meds including PRN narcotic.

## 2022-07-26 NOTE — Assessment & Plan Note (Signed)
CPAP QHS

## 2022-07-27 ENCOUNTER — Observation Stay (HOSPITAL_COMMUNITY): Payer: Medicare Other

## 2022-07-27 DIAGNOSIS — I5033 Acute on chronic diastolic (congestive) heart failure: Secondary | ICD-10-CM

## 2022-07-27 DIAGNOSIS — N1831 Chronic kidney disease, stage 3a: Secondary | ICD-10-CM | POA: Diagnosis not present

## 2022-07-27 DIAGNOSIS — G894 Chronic pain syndrome: Secondary | ICD-10-CM | POA: Diagnosis not present

## 2022-07-27 LAB — BASIC METABOLIC PANEL
Anion gap: 10 (ref 5–15)
BUN: 28 mg/dL — ABNORMAL HIGH (ref 8–23)
CO2: 33 mmol/L — ABNORMAL HIGH (ref 22–32)
Calcium: 8.9 mg/dL (ref 8.9–10.3)
Chloride: 99 mmol/L (ref 98–111)
Creatinine, Ser: 1.24 mg/dL — ABNORMAL HIGH (ref 0.44–1.00)
GFR, Estimated: 47 mL/min — ABNORMAL LOW (ref >60)
Glucose, Bld: 114 mg/dL — ABNORMAL HIGH (ref 70–99)
Potassium: 4.1 mmol/L (ref 3.5–5.1)
Sodium: 142 mmol/L (ref 135–145)

## 2022-07-27 LAB — ECHOCARDIOGRAM COMPLETE
AR max vel: 2.19 cm2
AV Area VTI: 2.15 cm2
AV Area mean vel: 2.28 cm2
AV Mean grad: 2 mmHg
AV Peak grad: 3.7 mmHg
Ao pk vel: 0.97 m/s
Area-P 1/2: 3.58 cm2
Calc EF: 45.5 %
Height: 66.5 in
MV M vel: 2.77 m/s
MV Peak grad: 30.7 mmHg
P 1/2 time: 337 msec
S' Lateral: 3.8 cm
Single Plane A2C EF: 39.1 %
Single Plane A4C EF: 52.6 %
Weight: 4769.6 oz

## 2022-07-27 LAB — D-DIMER, QUANTITATIVE: D-Dimer, Quant: 0.3 ug/mL-FEU (ref 0.00–0.50)

## 2022-07-27 LAB — GLUCOSE, CAPILLARY
Glucose-Capillary: 93 mg/dL (ref 70–99)
Glucose-Capillary: 74 mg/dL (ref 70–99)

## 2022-07-27 MED ORDER — PERFLUTREN LIPID MICROSPHERE
1.0000 mL | INTRAVENOUS | Status: AC | PRN
  Administered 2022-07-27: 2 mL via INTRAVENOUS

## 2022-07-27 MED ORDER — ORAL CARE MOUTH RINSE: 15.0000 mL | OROMUCOSAL | Status: DC | PRN

## 2022-07-27 NOTE — Progress Notes (Signed)
PROGRESS NOTE  Kathy [name] KDX:[id number] DOB: [birth year] DOA: 07/26/2022 PCP: Isaac Bliss, Rayford Halsted, MD  HPI/Recap of past 24 hours: Kathy [name] is a 71 y.o. female with medical history significant of HFpEF, HTN, morbid obesity, OSA on CPAP, A.Fib on eliquis presented to the ED c/o dyspnea, orthopnea with 8lb wt gain for the past couple of days. Patient also c/o persistent R sided flank pain, which she describes as sharp, denies any radiation or association.    Today, patient reports breathing has somewhat improved although still dyspneic.  Still complaining of right-sided flank pain that worsens with cough and deep breathing.     Assessment/Plan: Principal Problem:   Acute on chronic diastolic (congestive) heart failure (HCC) Active Problems:   CKD (chronic kidney disease) stage 3, GFR 30-59 ml/min (HCC)   RUQ abdominal pain   Morbid obesity (HCC)   Paroxysmal atrial fibrillation (HCC)   OSA on CPAP   Chronic pain disorder   Acute on chronic diastolic (congestive) heart failure (HCC) Currently on room air BNP 271 Troponin negative Echo pending Continue IV Lasix Continue toprol, diltiazem Strict I's and O's, daily weights Telemetry  Right flank/RUQ pain CT abdomen/pelvis shows hepatic steatosis, CT renal stone negative Pt is s/p cholecystectomy LFTs WNL RUQ ultrasound unremarkable Monitor   CKD (chronic kidney disease) stage 3a Creatinine around baseline Monitor closely while diuresing Daily BMP   Chronic pain disorder Cont home meds including PRN narcotic.   OSA on CPAP CPAP QHS.   Paroxysmal atrial fibrillation (HCC) Rate controlled Cont cardizem, toprol, and eliquis   Morbid obesity ([place]) Lifestyle modification advised    Estimated body mass index is 47.39 kg/m as calculated from the following:   Height as of this encounter: 5' 6.5" (1.689 m).   Weight as of this encounter: 135.2 kg.     Code Status: Full  Family  Communication: None at bedside  Disposition Plan: Status is: Observation The patient will require care spanning > 2 midnights and should be moved to inpatient because: Level of care     Consultants: None  Procedures: None  Antimicrobials: None  DVT prophylaxis: Eliquis   Objective: Vitals:   07/27/22 0145 07/27/22 0440 07/27/22 0602 07/27/22 0739  BP: 118/82 111/70  120/74  Pulse: 80 70  66  Resp: '20 20  18  '$ Temp: 98.2 F (36.8 C) 98.4 F (36.9 C)  97.9 F (36.6 C)  TempSrc: Oral Oral  Oral  SpO2: 92%   96%  Weight:  (!) 137 kg 135.2 kg   Height:        Intake/Output Summary (Last 24 hours) at 07/27/2022 1322 Last data filed at 07/27/2022 1200 Gross per 24 hour  Intake 828 ml  Output 5575 ml  Net -4747 ml   Filed Weights   07/26/22 2048 07/27/22 0440 07/27/22 0602  Weight: (!) 136.7 kg (!) 137 kg 135.2 kg    Exam: General: NAD  Cardiovascular: S1, S2 present Respiratory: CTAB Abdomen: Soft, +tender, nondistended, bowel sounds present Musculoskeletal: 1+ bilateral pedal edema noted Skin: Normal Psychiatry: Normal mood     Data Reviewed: CBC: Recent Labs  Lab 07/21/22 1324 07/21/22 1335 07/24/22 1337 07/26/22 1025  WBC 7.2  --  9.3 6.3  NEUTROABS 5.5  --   --   --   HGB 12.8 13.6 12.6 12.0  HCT 41.8 40.0 40.9 38.5  MCV 97.4  --  96.5 95.3  PLT 220  --  213 976   Basic Metabolic Panel:  Recent Labs  Lab 07/21/22 1324 07/21/22 1335 07/24/22 1337 07/26/22 1025 07/27/22 0347  NA 141 139 136 137 142  K 4.0 3.9 4.0 3.8 4.1  CL 101 99 98 97* 99  CO2 31  --  26 30 33*  GLUCOSE 120* 117* 94 108* 114*  BUN 15 16 28* 32* 28*  CREATININE 1.15* 1.10* 1.32* 1.38* 1.24*  CALCIUM 9.1  --  9.2 9.0 8.9   GFR: Estimated Creatinine Clearance: 60.2 mL/min (A) (by C-G formula based on SCr of 1.24 mg/dL (H)). Liver Function Tests: Recent Labs  Lab 07/21/22 1324 07/24/22 1337 07/26/22 1025  AST '19 20 18  '$ ALT '15 14 15  '$ ALKPHOS 92 77 71  BILITOT  1.1 1.2 1.0  PROT 6.4* 6.8 6.7  ALBUMIN 3.5 4.2 4.1   Recent Labs  Lab 07/24/22 1337 07/26/22 1025  LIPASE 32 51   No results for input(s): "AMMONIA" in the last 168 hours. Coagulation Profile: Recent Labs  Lab 07/21/22 1324  INR 1.5*   Cardiac Enzymes: No results for input(s): "CKTOTAL", "CKMB", "CKMBINDEX", "TROPONINI" in the last 168 hours. BNP (last 3 results) No results for input(s): "PROBNP" in the last 8760 hours. HbA1C: No results for input(s): "HGBA1C" in the last 72 hours. CBG: Recent Labs  Lab 07/27/22 1136  GLUCAP 93   Lipid Profile: No results for input(s): "CHOL", "HDL", "LDLCALC", "TRIG", "CHOLHDL", "LDLDIRECT" in the last 72 hours. Thyroid Function Tests: No results for input(s): "TSH", "T4TOTAL", "FREET4", "T3FREE", "THYROIDAB" in the last 72 hours. Anemia Panel: No results for input(s): "VITAMINB12", "FOLATE", "FERRITIN", "TIBC", "IRON", "RETICCTPCT" in the last 72 hours. Urine analysis:    Component Value Date/Time   COLORURINE YELLOW 07/26/2022 1238   APPEARANCEUR CLEAR 07/26/2022 1238   LABSPEC 1.017 07/26/2022 1238   PHURINE 5.0 07/26/2022 1238   GLUCOSEU NEGATIVE 07/26/2022 1238   HGBUR NEGATIVE 07/26/2022 1238   BILIRUBINUR NEGATIVE 07/26/2022 1238   BILIRUBINUR Positive 06/26/2022 1444   KETONESUR NEGATIVE 07/26/2022 1238   PROTEINUR NEGATIVE 07/26/2022 1238   UROBILINOGEN 0.2 06/26/2022 1444   UROBILINOGEN 1.0 03/28/2012 1649   NITRITE NEGATIVE 07/26/2022 1238   LEUKOCYTESUR SMALL (A) 07/26/2022 1238   Sepsis Labs: '@LABRCNTIP'$ (procalcitonin:4,lacticidven:4)  ) Recent Results (from the past 240 hour(s))  Urine Culture     Status: Abnormal   Collection Time: 07/24/22  1:37 PM   Specimen: Urine, Clean Catch  Result Value Ref Range Status   Specimen Description   Final    URINE, CLEAN CATCH Performed at [facility], [address] [place], [place] [address]    Special Requests   Final    NONE Performed at  [facility], [address] [place],  [address]    Culture (A)  Final    20,000 COLONIES/mL MULTIPLE SPECIES PRESENT, SUGGEST RECOLLECTION   Report Status 07/25/2022 FINAL  Final      Studies: US Abdomen Limited RUQ (LIVER/GB)  Result Date: 07/26/2022 CLINICAL DATA:  Right upper quadrant abdominal pain. EXAM: ULTRASOUND ABDOMEN LIMITED RIGHT UPPER QUADRANT COMPARISON:  CT 07/26/2022, 07/24/2022, levin 323 FINDINGS: Gallbladder: Surgically absent. Common bile duct: Diameter: 4 mm. Liver: Technically limited evaluation due to habitus. Borderline increased hepatic echogenicity typical of steatosis. No focal hepatic abnormality. Portal vein is patent on color Doppler imaging with normal direction of blood flow towards the liver. Other: No right upper quadrant ascites. IMPRESSION: 1. Post cholecystectomy without biliary dilatation. 2. Suggestion of mild hepatic steatosis.  No focal liver lesion. Electronically Signed  By: Keith Rake M.D.   On: 07/26/2022 22:19    Scheduled Meds:  apixaban  5 mg Oral BID   atorvastatin  40 mg Oral Daily   diltiazem  120 mg Oral Daily   FLUoxetine  40 mg Oral Daily   furosemide  40 mg Intravenous Daily   metoprolol succinate  100 mg Oral Daily   pantoprazole  80 mg Oral Daily   potassium chloride  10 mEq Oral Daily   sodium chloride flush  3 mL Intravenous Q12H    Continuous Infusions:  sodium chloride       LOS: 0 days     Alma Friendly, MD [facility]  If 7PM-7AM, please contact night-coverage [email or web address] 07/27/2022, 1:22 PM

## 2022-07-27 NOTE — Progress Notes (Signed)
  Echocardiogram 2D Echocardiogram has been performed.  Kathy [name] 07/27/2022, 3:39 PM

## 2022-07-27 NOTE — Progress Notes (Signed)
Pt continues to refuse CPAP qhs.

## 2022-07-27 NOTE — TOC Initial Note (Signed)
Transition of Care [facility]) - Initial/Assessment Note    Patient Details  Name: Kathy [name] MRN: [id number] Date of Birth: May 19, 1951  Transition of Care [facility]) CM/SW Contact:    Leeroy Cha, RN Phone Number: 07/27/2022, 7:33 AM  Clinical Narrative:                  Transition of Care [facility]) Screening Note   Patient Details  Name: Kathy [name] Date of Birth: 05/17/1951   Transition of Care [facility]) CM/SW Contact:    Leeroy Cha, RN Phone Number: 07/27/2022, 7:33 AM    Transition of Care Department [facility]) has reviewed patient and no TOC needs have been identified at this time. We will continue to monitor patient advancement through interdisciplinary progression rounds. If new patient transition needs arise, please place a TOC consult.    Expected Discharge Plan: Home/Self Care Barriers to Discharge: Continued Medical Work up   Patient Goals and CMS Choice Patient states their goals for this hospitalization and ongoing recovery are:: to go home CMS Medicare.gov Compare Post Acute Care list provided to:: Patient    Expected Discharge Plan and Services Expected Discharge Plan: Home/Self Care   Discharge Planning Services: CM Consult   Living arrangements for the past 2 months: Single Family Home                           HH Arranged: PT, OT HH Agency: [place]        Prior Living Arrangements/Services Living arrangements for the past 2 months: Single Family Home Lives with:: Self Patient language and need for interpreter reviewed:: Yes Do you feel safe going back to the place where you live?: Yes            Criminal Activity/Legal Involvement Pertinent to Current Situation/Hospitalization: No - Comment as needed  Activities of Daily Living Home Assistive Devices/Equipment: Cane (specify quad or straight), Walker (specify type) ADL Screening (condition at time of admission) Patient's cognitive ability adequate to  safely complete daily activities?: Yes Is the patient deaf or have difficulty hearing?: No Does the patient have difficulty seeing, even when wearing glasses/contacts?: No Does the patient have difficulty concentrating, remembering, or making decisions?: No Patient able to express need for assistance with ADLs?: Yes Does the patient have difficulty dressing or bathing?: No Independently performs ADLs?: Yes (appropriate for developmental age) Does the patient have difficulty walking or climbing stairs?: Yes Weakness of Legs: None Weakness of Arms/Hands: None  Permission Sought/Granted                  Emotional Assessment Appearance:: Appears stated age Attitude/Demeanor/Rapport: Engaged Affect (typically observed): Calm Orientation: : Oriented to Self, Oriented to Place, Oriented to  Time, Oriented to Situation Alcohol / Substance Use: Alcohol Use (occasionaL) Psych Involvement: No (comment)  Admission diagnosis:  Acute on chronic diastolic (congestive) heart failure (HCC) [I50.33] Acute congestive heart failure, unspecified heart failure type [facility]) [I50.9] Patient Active Problem List   Diagnosis Date Noted   Acute on chronic diastolic (congestive) heart failure ([place]) 07/26/2022   RUQ abdominal pain 07/26/2022   Dizziness 07/21/2022   Chronic pain disorder 07/21/2022   CKD (chronic kidney disease) stage 3, GFR 30-59 ml/min ([place]) 10/12/2021   Chronic anticoagulation 08/30/2021   OSA on CPAP    Depression, recurrent ([place]) 05/06/2019   Persistent atrial fibrillation ([place]) 12/31/2018   Chronic diastolic CHF (congestive heart failure) ([place]) 12/31/2018  Bilateral leg edema 07/29/2018   Paroxysmal atrial fibrillation (HCC) 06/25/2018   Chest pain 06/25/2018   Anxiety and depression 06/25/2018   Difficulty walking 02/13/2017   Low back pain 02/13/2017   Allergic rhinitis 07/31/2016   Malignant melanoma ([place]) 08/31/2013   Other abnormal glucose 01/22/2013   Anemia,  unspecified 01/22/2013   OA (osteoarthritis) of hip 03/18/2012   Personal history of DVT (deep vein thrombosis) 01/05/2012   History of colonic polyps 08/08/2011   ARTHRITIS, HIP 01/24/2010   PERSONAL HISTORY OF FAILED MODERATE SEDATION 07/21/2009   Morbid obesity (Home) 04/05/2009   Esophageal reflux 02/25/2009   HEART VALVE DISEASE 12/28/2008   HEADACHE 04/21/2008   Essential hypertension 10/15/2007   LUMBAR RADICULOPATHY, RIGHT 10/15/2007   FATIGUE 10/15/2007   IRRITABLE BOWEL SYNDROME 07/23/2007   Fibromyalgia 07/23/2007   PCP:  Erline Hau, MD Pharmacy:   [id number] [address], [place] [address] Phone: [phone] Fax: [phone]  [id number] [place] [place] - [address] [place] [address]Phone: [phone]: [phone] [id number] [place]: [phone]: [id number] [place] - [address] [address]: [phone]: [phone]: [phone]: [phone]   Social Determinants of Health (SDOH) Interventions    Readmission Risk Interventions   No data to display

## 2022-07-28 DIAGNOSIS — I5033 Acute on chronic diastolic (congestive) heart failure: Secondary | ICD-10-CM | POA: Diagnosis not present

## 2022-07-28 DIAGNOSIS — N1831 Chronic kidney disease, stage 3a: Secondary | ICD-10-CM | POA: Diagnosis not present

## 2022-07-28 DIAGNOSIS — G894 Chronic pain syndrome: Secondary | ICD-10-CM | POA: Diagnosis not present

## 2022-07-28 LAB — CBC WITH DIFFERENTIAL/PLATELET
Abs Immature Granulocytes: 0.02 10*3/uL (ref 0.00–0.07)
Basophils Absolute: 0 10*3/uL (ref 0.0–0.1)
Basophils Relative: 0 %
Eosinophils Absolute: 0.2 10*3/uL (ref 0.0–0.5)
Eosinophils Relative: 3 %
HCT: 40.3 % (ref 36.0–46.0)
Hemoglobin: 12.4 g/dL (ref 12.0–15.0)
Immature Granulocytes: 0 %
Lymphocytes Relative: 27 %
Lymphs Abs: 1.6 10*3/uL (ref 0.7–4.0)
MCH: 29.4 pg (ref 26.0–34.0)
MCHC: 30.8 g/dL (ref 30.0–36.0)
MCV: 95.5 fL (ref 80.0–100.0)
Monocytes Absolute: 0.7 10*3/uL (ref 0.1–1.0)
Monocytes Relative: 11 %
Neutro Abs: 3.5 10*3/uL (ref 1.7–7.7)
Neutrophils Relative %: 59 %
Platelets: 212 10*3/uL (ref 150–400)
RBC: 4.22 MIL/uL (ref 3.87–5.11)
RDW: 15.9 % — ABNORMAL HIGH (ref 11.5–15.5)
WBC: 6 10*3/uL (ref 4.0–10.5)
nRBC: 0 % (ref 0.0–0.2)

## 2022-07-28 LAB — MAGNESIUM: Magnesium: 1.9 mg/dL (ref 1.7–2.4)

## 2022-07-28 LAB — BASIC METABOLIC PANEL
Anion gap: 12 (ref 5–15)
BUN: 19 mg/dL (ref 8–23)
CO2: 30 mmol/L (ref 22–32)
Calcium: 8.7 mg/dL — ABNORMAL LOW (ref 8.9–10.3)
Chloride: 97 mmol/L — ABNORMAL LOW (ref 98–111)
Creatinine, Ser: 1.05 mg/dL — ABNORMAL HIGH (ref 0.44–1.00)
GFR, Estimated: 57 mL/min — ABNORMAL LOW (ref >60)
Glucose, Bld: 117 mg/dL — ABNORMAL HIGH (ref 70–99)
Potassium: 3.1 mmol/L — ABNORMAL LOW (ref 3.5–5.1)
Sodium: 139 mmol/L (ref 135–145)

## 2022-07-28 MED ORDER — POLYVINYL ALCOHOL 1.4 % OP SOLN
1.0000 [drp] | OPHTHALMIC | Status: DC | PRN
  Administered 2022-07-28: 1 [drp] via OPHTHALMIC
  Filled 2022-07-28: qty 15

## 2022-07-28 MED ORDER — POTASSIUM CHLORIDE CRYS ER 20 MEQ PO TBCR
40.0000 meq | EXTENDED_RELEASE_TABLET | Freq: Every day | ORAL | Status: AC
  Administered 2022-07-28 – 2022-07-29 (×2): 40 meq via ORAL
  Filled 2022-07-28 (×2): qty 2

## 2022-07-28 NOTE — Progress Notes (Signed)
Patient refuses CPAP per previous RT and also Progress note from last night

## 2022-07-28 NOTE — Progress Notes (Signed)
PROGRESS NOTE  Kathy [name] DTO:[id number] DOB: October 04, 1950 DOA: 07/26/2022 PCP: Isaac Bliss, Rayford Halsted, MD  HPI/Recap of past 24 hours: Kathy [name] is a 71 y.o. female with medical history significant of HFpEF, HTN, morbid obesity, OSA on CPAP, A.Fib on eliquis presented to the ED c/o dyspnea, orthopnea with 8lb wt gain for the past couple of days. Patient also c/o persistent R sided flank pain, which she describes as sharp, denies any radiation or association.   Today, patient reports headache, poor sleep, overall not feeling great.  Refused to use her CPAP and has not been using it for months.  Educated, advised patient about results and the need for CPAP.     Assessment/Plan: Principal Problem:   Acute on chronic diastolic (congestive) heart failure (HCC) Active Problems:   CKD (chronic kidney disease) stage 3, GFR 30-59 ml/min (HCC)   RUQ abdominal pain   Morbid obesity (HCC)   Paroxysmal atrial fibrillation (HCC)   OSA on CPAP   Chronic pain disorder   Acute on chronic diastolic (congestive) heart failure ([place]) ?Right-sided heart failure Pulmonary hypertension Currently on room air BNP 271 Troponin negative D-dimer 0.3 Echo showed EF of 50 to 55%, no regional wall motion abnormality, left ventricular diastolic parameters were indeterminate.  Right ventricular systolic function is severely reduced, severely enlarged, moderately elevated pulmonary artery systolic pressure, right atrial size is severely dilated, tricuspid valve regurgitation is severe Continue IV Lasix Continue toprol, diltiazem Cardiology consulted Strict I's and O's, daily weights Telemetry  Hypokalemia Replace as needed  Right flank/RUQ pain Possibly related to right-sided heart failure CT abdomen/pelvis shows hepatic steatosis, CT renal stone negative Pt is s/p cholecystectomy LFTs WNL RUQ ultrasound unremarkable Monitor   CKD (chronic kidney disease) stage 3a Creatinine  around baseline Monitor closely while diuresing Daily BMP   Chronic pain disorder Cont home meds including PRN narcotic.   OSA on CPAP CPAP QHS, advised and educated   Paroxysmal atrial fibrillation ([place]) Rate controlled Cont cardizem, toprol, and eliquis   Morbid obesity ([place]) Lifestyle modification advised    Estimated body mass index is 47.18 kg/m as calculated from the following:   Height as of this encounter: 5' 6.5" (1.689 m).   Weight as of this encounter: 134.6 kg.     Code Status: Full  Family Communication: None at bedside  Disposition Plan: Status is: Observation The patient will require care spanning > 2 midnights and should be moved to inpatient because: Level of care     Consultants: Cardiology  Procedures: None  Antimicrobials: None  DVT prophylaxis: Eliquis   Objective: Vitals:   07/27/22 2106 07/28/22 0432 07/28/22 0446 07/28/22 1232  BP: 117/82 137/76  133/86  Pulse: 76 69  85  Resp: '20 19  16  '$ Temp: 98.2 F (36.8 C) 97.8 F (36.6 C)  97.8 F (36.6 C)  TempSrc: Oral Oral  Oral  SpO2: 96% 96%  96%  Weight:   134.6 kg   Height:        Intake/Output Summary (Last 24 hours) at 07/28/2022 1646 Last data filed at 07/28/2022 1411 Gross per 24 hour  Intake --  Output 2600 ml  Net -2600 ml   Filed Weights   07/27/22 0440 07/27/22 0602 07/28/22 0446  Weight: (!) 137 kg 135.2 kg 134.6 kg    Exam: General: NAD  Cardiovascular: S1, S2 present Respiratory: Diminished breath sounds bilaterally Abdomen: Soft, +tender, nondistended, bowel sounds present Musculoskeletal: 1+ bilateral pedal edema noted Skin: Normal  Psychiatry: Normal mood     Data Reviewed: CBC: Recent Labs  Lab 07/24/22 1337 07/26/22 1025 07/28/22 0339  WBC 9.3 6.3 6.0  NEUTROABS  --   --  3.5  HGB 12.6 12.0 12.4  HCT 40.9 38.5 40.3  MCV 96.5 95.3 95.5  PLT 213 201 643   Basic Metabolic Panel: Recent Labs  Lab 07/24/22 1337 07/26/22 1025  07/27/22 0347 07/28/22 0339  NA 136 137 142 139  K 4.0 3.8 4.1 3.1*  CL 98 97* 99 97*  CO2 26 30 33* 30  GLUCOSE 94 108* 114* 117*  BUN 28* 32* 28* 19  CREATININE 1.32* 1.38* 1.24* 1.05*  CALCIUM 9.2 9.0 8.9 8.7*  MG  --   --   --  1.9   GFR: Estimated Creatinine Clearance: 70.9 mL/min (A) (by C-G formula based on SCr of 1.05 mg/dL (H)). Liver Function Tests: Recent Labs  Lab 07/24/22 1337 07/26/22 1025  AST 20 18  ALT 14 15  ALKPHOS 77 71  BILITOT 1.2 1.0  PROT 6.8 6.7  ALBUMIN 4.2 4.1   Recent Labs  Lab 07/24/22 1337 07/26/22 1025  LIPASE 32 51   No results for input(s): "AMMONIA" in the last 168 hours. Coagulation Profile: No results for input(s): "INR", "PROTIME" in the last 168 hours.  Cardiac Enzymes: No results for input(s): "CKTOTAL", "CKMB", "CKMBINDEX", "TROPONINI" in the last 168 hours. BNP (last 3 results) No results for input(s): "PROBNP" in the last 8760 hours. HbA1C: No results for input(s): "HGBA1C" in the last 72 hours. CBG: Recent Labs  Lab 07/27/22 1136 07/27/22 1626  GLUCAP 93 74   Lipid Profile: No results for input(s): "CHOL", "HDL", "LDLCALC", "TRIG", "CHOLHDL", "LDLDIRECT" in the last 72 hours. Thyroid Function Tests: No results for input(s): "TSH", "T4TOTAL", "FREET4", "T3FREE", "THYROIDAB" in the last 72 hours. Anemia Panel: No results for input(s): "VITAMINB12", "FOLATE", "FERRITIN", "TIBC", "IRON", "RETICCTPCT" in the last 72 hours. Urine analysis:    Component Value Date/Time   COLORURINE YELLOW 07/26/2022 1238   APPEARANCEUR CLEAR 07/26/2022 1238   LABSPEC 1.017 07/26/2022 1238   PHURINE 5.0 07/26/2022 1238   GLUCOSEU NEGATIVE 07/26/2022 1238   HGBUR NEGATIVE 07/26/2022 1238   BILIRUBINUR NEGATIVE 07/26/2022 1238   BILIRUBINUR Positive 06/26/2022 1444   KETONESUR NEGATIVE 07/26/2022 1238   PROTEINUR NEGATIVE 07/26/2022 1238   UROBILINOGEN 0.2 06/26/2022 1444   UROBILINOGEN 1.0 03/28/2012 1649   NITRITE NEGATIVE  07/26/2022 1238   LEUKOCYTESUR SMALL (A) 07/26/2022 1238   Sepsis Labs: '@LABRCNTIP'$ (procalcitonin:4,lacticidven:4)  ) Recent Results (from the past 240 hour(s))  Urine Culture     Status: Abnormal   Collection Time: 07/24/22  1:37 PM   Specimen: Urine, Clean Catch  Result Value Ref Range Status   Specimen Description   Final    URINE, CLEAN CATCH Performed at [facility], [address] [place], [place] [address]    Special Requests   Final    NONE Performed at [facility], [address], [place], [place] [address]    Culture (A)  Final    20,000 COLONIES/mL MULTIPLE SPECIES PRESENT, SUGGEST RECOLLECTION   Report Status 07/25/2022 FINAL  Final      Studies: No results found.  Scheduled Meds:  apixaban  5 mg Oral BID   atorvastatin  40 mg Oral Daily   diltiazem  120 mg Oral Daily   FLUoxetine  40 mg Oral Daily   furosemide  40 mg Intravenous Daily   metoprolol succinate  100  mg Oral Daily   pantoprazole  80 mg Oral Daily   potassium chloride  40 mEq Oral Daily   sodium chloride flush  3 mL Intravenous Q12H    Continuous Infusions:  sodium chloride       LOS: 0 days     Alma Friendly, MD [facility]  If 7PM-7AM, please contact night-coverage [email or web address] 07/28/2022, 4:46 PM

## 2022-07-29 DIAGNOSIS — H9191 Unspecified hearing loss, right ear: Secondary | ICD-10-CM | POA: Diagnosis present

## 2022-07-29 DIAGNOSIS — G8929 Other chronic pain: Secondary | ICD-10-CM | POA: Diagnosis present

## 2022-07-29 DIAGNOSIS — Z86718 Personal history of other venous thrombosis and embolism: Secondary | ICD-10-CM | POA: Diagnosis not present

## 2022-07-29 DIAGNOSIS — Z8249 Family history of ischemic heart disease and other diseases of the circulatory system: Secondary | ICD-10-CM | POA: Diagnosis not present

## 2022-07-29 DIAGNOSIS — I2729 Other secondary pulmonary hypertension: Secondary | ICD-10-CM | POA: Diagnosis present

## 2022-07-29 DIAGNOSIS — I50811 Acute right heart failure: Secondary | ICD-10-CM

## 2022-07-29 DIAGNOSIS — Z8719 Personal history of other diseases of the digestive system: Secondary | ICD-10-CM | POA: Diagnosis not present

## 2022-07-29 DIAGNOSIS — G894 Chronic pain syndrome: Secondary | ICD-10-CM | POA: Diagnosis not present

## 2022-07-29 DIAGNOSIS — K760 Fatty (change of) liver, not elsewhere classified: Secondary | ICD-10-CM | POA: Diagnosis present

## 2022-07-29 DIAGNOSIS — Z87442 Personal history of urinary calculi: Secondary | ICD-10-CM | POA: Diagnosis not present

## 2022-07-29 DIAGNOSIS — N1831 Chronic kidney disease, stage 3a: Secondary | ICD-10-CM | POA: Diagnosis present

## 2022-07-29 DIAGNOSIS — I130 Hypertensive heart and chronic kidney disease with heart failure and stage 1 through stage 4 chronic kidney disease, or unspecified chronic kidney disease: Secondary | ICD-10-CM | POA: Diagnosis present

## 2022-07-29 DIAGNOSIS — Z86010 Personal history of colonic polyps: Secondary | ICD-10-CM | POA: Diagnosis not present

## 2022-07-29 DIAGNOSIS — I071 Rheumatic tricuspid insufficiency: Secondary | ICD-10-CM | POA: Diagnosis present

## 2022-07-29 DIAGNOSIS — I5082 Biventricular heart failure: Secondary | ICD-10-CM | POA: Diagnosis present

## 2022-07-29 DIAGNOSIS — G4733 Obstructive sleep apnea (adult) (pediatric): Secondary | ICD-10-CM | POA: Diagnosis present

## 2022-07-29 DIAGNOSIS — E876 Hypokalemia: Secondary | ICD-10-CM | POA: Diagnosis present

## 2022-07-29 DIAGNOSIS — R0789 Other chest pain: Secondary | ICD-10-CM | POA: Diagnosis present

## 2022-07-29 DIAGNOSIS — Z87820 Personal history of traumatic brain injury: Secondary | ICD-10-CM | POA: Diagnosis not present

## 2022-07-29 DIAGNOSIS — I872 Venous insufficiency (chronic) (peripheral): Secondary | ICD-10-CM | POA: Diagnosis present

## 2022-07-29 DIAGNOSIS — M797 Fibromyalgia: Secondary | ICD-10-CM | POA: Diagnosis present

## 2022-07-29 DIAGNOSIS — I480 Paroxysmal atrial fibrillation: Secondary | ICD-10-CM | POA: Diagnosis present

## 2022-07-29 DIAGNOSIS — F32A Depression, unspecified: Secondary | ICD-10-CM | POA: Diagnosis present

## 2022-07-29 DIAGNOSIS — Z6841 Body Mass Index (BMI) 40.0 and over, adult: Secondary | ICD-10-CM | POA: Diagnosis not present

## 2022-07-29 DIAGNOSIS — Z85820 Personal history of malignant melanoma of skin: Secondary | ICD-10-CM | POA: Diagnosis not present

## 2022-07-29 DIAGNOSIS — I5033 Acute on chronic diastolic (congestive) heart failure: Secondary | ICD-10-CM | POA: Diagnosis present

## 2022-07-29 LAB — CBC WITH DIFFERENTIAL/PLATELET
Abs Immature Granulocytes: 0.02 10*3/uL (ref 0.00–0.07)
Basophils Absolute: 0 10*3/uL (ref 0.0–0.1)
Basophils Relative: 1 %
Eosinophils Absolute: 0.2 10*3/uL (ref 0.0–0.5)
Eosinophils Relative: 3 %
HCT: 40.7 % (ref 36.0–46.0)
Hemoglobin: 12.6 g/dL (ref 12.0–15.0)
Immature Granulocytes: 0 %
Lymphocytes Relative: 23 %
Lymphs Abs: 1.3 10*3/uL (ref 0.7–4.0)
MCH: 29.5 pg (ref 26.0–34.0)
MCHC: 31 g/dL (ref 30.0–36.0)
MCV: 95.3 fL (ref 80.0–100.0)
Monocytes Absolute: 0.7 10*3/uL (ref 0.1–1.0)
Monocytes Relative: 13 %
Neutro Abs: 3.4 10*3/uL (ref 1.7–7.7)
Neutrophils Relative %: 60 %
Platelets: 206 10*3/uL (ref 150–400)
RBC: 4.27 MIL/uL (ref 3.87–5.11)
RDW: 15.9 % — ABNORMAL HIGH (ref 11.5–15.5)
WBC: 5.6 10*3/uL (ref 4.0–10.5)
nRBC: 0 % (ref 0.0–0.2)

## 2022-07-29 LAB — BASIC METABOLIC PANEL
Anion gap: 8 (ref 5–15)
BUN: 14 mg/dL (ref 8–23)
CO2: 35 mmol/L — ABNORMAL HIGH (ref 22–32)
Calcium: 8.7 mg/dL — ABNORMAL LOW (ref 8.9–10.3)
Chloride: 98 mmol/L (ref 98–111)
Creatinine, Ser: 1.01 mg/dL — ABNORMAL HIGH (ref 0.44–1.00)
GFR, Estimated: 60 mL/min — ABNORMAL LOW (ref >60)
Glucose, Bld: 101 mg/dL — ABNORMAL HIGH (ref 70–99)
Potassium: 3.5 mmol/L (ref 3.5–5.1)
Sodium: 141 mmol/L (ref 135–145)

## 2022-07-29 MED ORDER — SACUBITRIL-VALSARTAN 24-26 MG PO TABS
1.0000 | ORAL_TABLET | Freq: Two times a day (BID) | ORAL | Status: DC
  Administered 2022-07-29 – 2022-07-30 (×3): 1 via ORAL
  Filled 2022-07-29 (×3): qty 1

## 2022-07-29 MED ORDER — FUROSEMIDE 10 MG/ML IJ SOLN: 40.0000 mg | Freq: Two times a day (BID) | INTRAMUSCULAR | Status: DC

## 2022-07-29 MED ORDER — FUROSEMIDE 40 MG PO TABS
40.0000 mg | ORAL_TABLET | Freq: Every day | ORAL | Status: DC
  Administered 2022-07-29 – 2022-07-30 (×2): 40 mg via ORAL
  Filled 2022-07-29 (×2): qty 1

## 2022-07-29 NOTE — Consult Note (Signed)
CONSULTATION NOTE   Patient Name: Kathy [name] Date of Encounter: 07/29/2022 Cardiologist: Mertie Moores, MD Electrophysiologist: None Advanced Heart Failure: None   Chief Complaint   Consult for CHF, shortness of breath  Patient Profile   71 yo female presenting with shortness of breath, history of HFpEF, HTN, OSA on CPAP  HPI   Kathy [name] is a 71 y.o. female who is being seen today for the evaluation of CHF at the request of Dr. Horris Latino. This is a 71 yo female followed by Dr. Acie Fredrickson with a history of HFpEF, HTN, OSA on CPAP, PAF on Eliquis and morbid obesity. She presented with progressive dyspnea on exertion, 8 lb weight gain since recent discharge from hospitalization for dizziness, presyncope and possible UTI. She reports orthopnea and has been compliant with diuretics. Started on 40 mg IV lasix daily - updated echo has been ordered. She has been noted to have worsening CKD stage 3 compared to recent ER visit-  Antibiotics being held d/t low suspicion for UTI.  Good urine output overnight, now 2.1L negative, overall net negative about 8L since admission (3 days ago) . Creatinine has improved with diuresis. Potassium 3.5 today, magnesium 1.9.  Echo performed on 11/9 shows LVEF 50-55% (down from 60-65%) and severe RV systolic dysfunction. She reports gaining weight over the past year and non-compliance with CPAP after her husband died.  PMHx   Past Medical History:  Diagnosis Date   Adenomatous colon polyp    2012   Anxiety and depression    Arthritis    Osteoarthritis, Dr Maureen Ralphs   Cancer [facility])    skin - left foot excised   Chronic diastolic CHF (congestive heart failure) ([place])    DDD (degenerative disc disease), cervical    Deaf, right    DVT (deep venous thrombosis) ([place])    RLE DVT 08/2004, post right knee arthroscopy   Fibromyalgia    Dr Tobie Lords, Rheu   GERD (gastroesophageal reflux disease)    H/O hiatal hernia    Headache(784.0)    PAST  HX MILD MIGRAINES - resolved per patient 08/18/20   History of kidney stones    passed stones   HTN (hypertension)    IBS (irritable bowel syndrome)    Internal hemorrhoid    Lumbar radiculopathy    Obesity    OSA on CPAP    Moderate with AHI 20/hr with nocturnal hypoxemia on CPAP   PAF (paroxysmal atrial fibrillation) ([place]) 06/2018   Pulmonary hypertension ([place])     Past Surgical History:  Procedure Laterality Date   ABDOMINAL HYSTERECTOMY     dysfunctional menses   APPENDECTOMY     BREAST EXCISIONAL BIOPSY Right    CARDIAC CATHETERIZATION  06/26/2018   CHOLECYSTECTOMY     COLONOSCOPY  05/27/2002, 08/08/11   2003 diverticulosis, hemorrhoids 2012 same + cecal polyp   EYE SURGERY     KNEE ARTHROSCOPY  2008   right   KYPHOPLASTY Bilateral 08/19/2020   Procedure: KYPHOPLASTY LUMBAR ONE;  Surgeon: Vallarie Mare, MD;  Location: [place];  Service: Neurosurgery;  Laterality: Bilateral;  posterior   LEFT HEART CATH AND CORONARY ANGIOGRAPHY N/A 06/26/2018   Procedure: LEFT HEART CATH AND CORONARY ANGIOGRAPHY;  Surgeon: Wellington Hampshire, MD;  Location: [place] [facility];  Service: Cardiovascular;  Laterality: N/A;   MELANOMA EXCISION Left 07/2013   foot   TOTAL HIP ARTHROPLASTY  02/2010   left ; Dr Maureen Ralphs   TOTAL HIP ARTHROPLASTY  03/18/2012   Procedure: TOTAL HIP ARTHROPLASTY;  Surgeon: Gearlean Alf, MD;  Location: [facility];  Service: Orthopedics;  Laterality: Right;   UPPER GASTROINTESTINAL ENDOSCOPY  04/21/2009, 08/08/11   2010 gastritis ;2012 small hiatal hernia. Dr Carlean Purl   WRIST FRACTURE SURGERY Left     FAMHx   Family History  Problem Relation Age of Onset   Heart attack Mother 58   COPD Mother    Breast cancer Mother 3   Hypertension Father    Diabetes Father    Heart failure Father 32   Breast cancer Paternal Aunt        cns mets   COPD Maternal Grandmother    Heart disease Paternal Grandmother        died 59   Diabetes Paternal Grandmother    Stroke  Paternal Grandfather        in late 76s   Heart attack Brother 45   Colon cancer Neg Hx     SOCHx    reports that she has never smoked. She has never used smokeless tobacco. She reports current alcohol use. She reports that she does not use drugs.  Outpatient Medications   No current facility-administered medications on file prior to encounter.   Current Outpatient Medications on File Prior to Encounter  Medication Sig Dispense Refill   ALPRAZolam (XANAX) 1 MG tablet TAKE 1 TABLET BY MOUTH THREE TIMES A DAY AS NEEDED FOR ANXIETY (Patient taking differently: Take 1 mg by mouth 3 (three) times daily as needed for anxiety.) 90 tablet 0   apixaban (ELIQUIS) 5 MG TABS tablet Take 1 tablet (5 mg total) by mouth 2 (two) times daily. 56 tablet 0   atorvastatin (LIPITOR) 40 MG tablet Take 1 tablet (40 mg total) by mouth daily. 90 tablet 3   Calcium Carbonate (CALCIUM 600 PO) Take 1 tablet by mouth daily.     ciprofloxacin (CIPRO) 500 MG tablet Take 1 tablet (500 mg total) by mouth every 12 (twelve) hours. 12 tablet 0   clindamycin (CLEOCIN T) 1 % lotion Apply 1 application topically 2 (two) times daily as needed (rosacea (skin irritation)).      clobetasol (TEMOVATE) 0.05 % external solution Apply 1 application topically daily as needed (rosacea).     cyclobenzaprine (FLEXERIL) 5 MG tablet Take 5 mg by mouth at bedtime as needed for muscle spasms.     diltiazem (CARDIZEM CD) 120 MG 24 hr capsule Take 1 capsule (120 mg total) by mouth daily. 90 capsule 3   fluocinonide (LIDEX) 0.05 % external solution Apply 1 application topically daily as needed (rosacea).     FLUoxetine (PROZAC) 40 MG capsule TAKE 1 CAPSULE BY MOUTH EVERY DAY (Patient taking differently: Take 40 mg by mouth daily.) 90 capsule 0   furosemide (LASIX) 40 MG tablet TAKE 1 TABLET BY MOUTH DAILY. PLEASE KEEP UPCOMING APPOINTMENT IN JUNE 2023 FOR FUTURE REFILLS. (Patient taking differently: Take 40 mg by mouth daily.) 90 tablet 1    gabapentin (NEURONTIN) 300 MG capsule Take 300 mg by mouth at bedtime. TAKE 1 CAPSULE BY MOUTH EVERY DAY AT BEDTIME FOR 30 DAYS     HYDROcodone-acetaminophen (NORCO/VICODIN) 5-325 MG tablet Take 1 tablet by mouth every 6 (six) hours as needed for moderate pain. 30 tablet 0   methocarbamol (ROBAXIN) 500 MG tablet Take 500 mg by mouth every 8 (eight) hours as needed.     metoprolol succinate (TOPROL-XL) 100 MG 24 hr tablet TAKE 1 TABLET BY MOUTH EVERY DAY (Patient  taking differently: Take 100 mg by mouth daily.) 30 tablet 5   metroNIDAZOLE (METROCREAM) 0.75 % cream Apply 1 application topically 2 (two) times daily as needed (rosacea).      Multiple Vitamin (MULTIVITAMIN WITH MINERALS) TABS tablet Take 1 tablet by mouth daily. Centrum Silver     omeprazole (PRILOSEC) 20 MG capsule Take 2 capsules (40 mg total) by mouth daily. 60 capsule 0   potassium chloride (KLOR-CON) 10 MEQ tablet Take 1 tablet (10 mEq total) by mouth daily. 90 tablet 3    Inpatient Medications    Scheduled Meds:  apixaban  5 mg Oral BID   atorvastatin  40 mg Oral Daily   diltiazem  120 mg Oral Daily   FLUoxetine  40 mg Oral Daily   furosemide  40 mg Intravenous Daily   metoprolol succinate  100 mg Oral Daily   pantoprazole  80 mg Oral Daily   potassium chloride  40 mEq Oral Daily   sodium chloride flush  3 mL Intravenous Q12H    Continuous Infusions:  sodium chloride      PRN Meds: sodium chloride, acetaminophen, ALPRAZolam, HYDROcodone-acetaminophen, methocarbamol, ondansetron (ZOFRAN) IV, mouth rinse, polyvinyl alcohol, sodium chloride flush   ALLERGIES   Allergies  Allergen Reactions   Achromycin [Tetracycline Hcl] Shortness Of Breath    Dyspnea     Penicillins     Facial angioedema Did it involve swelling of the face/tongue/throat, SOB, or low BP? Yes Did it involve sudden or severe rash/hives, skin peeling, or any reaction on the inside of your mouth or nose? No Did you need to seek medical attention  at a hospital or doctor's office? No When did it last happen?      30+ years If all above answers are "NO", may proceed with cephalosporin use.    Sulfamethoxazole-Trimethoprim Hives   Nickel Itching   Zanaflex [Tizanidine Hydrochloride]     Causes more pain     ROS   Pertinent items noted in HPI and remainder of comprehensive ROS otherwise negative.  Vitals   Vitals:   07/28/22 1232 07/28/22 1948 07/29/22 0500 07/29/22 0524  BP: 133/86 (!) 142/88  (!) 161/75  Pulse: 85 71  68  Resp: '16 17  17  '$ Temp: 97.8 F (36.6 C) 98.1 F (36.7 C)  98 F (36.7 C)  TempSrc: Oral Oral  Oral  SpO2: 96%   100%  Weight:   132.5 kg   Height:        Intake/Output Summary (Last 24 hours) at 07/29/2022 9390 Last data filed at 07/28/2022 1411 Gross per 24 hour  Intake --  Output 1900 ml  Net -1900 ml   Filed Weights   07/27/22 0602 07/28/22 0446 07/29/22 0500  Weight: 135.2 kg 134.6 kg 132.5 kg    Physical Exam   General appearance: alert, no distress, and morbidly obese Neck: no carotid bruit, no JVD, and thyroid not enlarged, symmetric, no tenderness/mass/nodules Lungs: clear to auscultation bilaterally Heart: irregularly irregular rhythm Abdomen: soft, non-tender; bowel sounds normal; no masses,  no organomegaly and obese Extremities: edema trivial sockline Pulses: 2+ and symmetric Skin: Skin color, texture, turgor normal. No rashes or lesions Neurologic: Grossly normal Psych: Pleasant  Labs   Results for orders placed or performed during the hospital encounter of 07/26/22 (from the past 48 hour(s))  Glucose, capillary     Status: None   Collection Time: 07/27/22 11:36 AM  Result Value Ref Range   Glucose-Capillary 93 70 - 99 mg/dL  Comment: Glucose reference range applies only to samples taken after fasting for at least 8 hours.  D-dimer, quantitative     Status: None   Collection Time: 07/27/22  2:15 PM  Result Value Ref Range   D-Dimer, Quant 0.30 0.00 - 0.50  ug/mL-FEU    Comment: (NOTE) At the manufacturer cut-off value of 0.5 g/mL FEU, this assay has a negative predictive value of 95-100%.This assay is intended for use in conjunction with a clinical pretest probability (PTP) assessment model to exclude pulmonary embolism (PE) and deep venous thrombosis (DVT) in outpatients suspected of PE or DVT. Results should be correlated with clinical presentation. Performed at [facility], [place] [address], [place], [place] [address]   Glucose, capillary     Status: None   Collection Time: 07/27/22  4:26 PM  Result Value Ref Range   Glucose-Capillary 74 70 - 99 mg/dL    Comment: Glucose reference range applies only to samples taken after fasting for at least 8 hours.  Basic metabolic panel     Status: Abnormal   Collection Time: 07/28/22  3:39 AM  Result Value Ref Range   Sodium 139 135 - 145 mmol/L   Potassium 3.1 (L) 3.5 - 5.1 mmol/L   Chloride 97 (L) 98 - 111 mmol/L   CO2 30 22 - 32 mmol/L   Glucose, Bld 117 (H) 70 - 99 mg/dL    Comment: Glucose reference range applies only to samples taken after fasting for at least 8 hours.   BUN 19 8 - 23 mg/dL   Creatinine, Ser 1.05 (H) 0.44 - 1.00 mg/dL   Calcium 8.7 (L) 8.9 - 10.3 mg/dL   GFR, Estimated 57 (L) >60 mL/min    Comment: (NOTE) Calculated using the CKD-EPI Creatinine Equation (2021)    Anion gap 12 5 - 15    Comment: Performed at [facility], [place] [address] [place], [place] [address]  CBC with Differential/Platelet     Status: Abnormal   Collection Time: 07/28/22  3:39 AM  Result Value Ref Range   WBC 6.0 4.0 - 10.5 K/uL   RBC 4.22 3.87 - 5.11 MIL/uL   Hemoglobin 12.4 12.0 - 15.0 g/dL   HCT 40.3 36.0 - 46.0 %   MCV 95.5 80.0 - 100.0 fL   MCH 29.4 26.0 - 34.0 pg   MCHC 30.8 30.0 - 36.0 g/dL   RDW 15.9 (H) 11.5 - 15.5 %   Platelets 212 150 - 400 K/uL   nRBC 0.0 0.0 - 0.2 %   Neutrophils Relative % 59 %   Neutro Abs 3.5 1.7 - 7.7 K/uL    Lymphocytes Relative 27 %   Lymphs Abs 1.6 0.7 - 4.0 K/uL   Monocytes Relative 11 %   Monocytes Absolute 0.7 0.1 - 1.0 K/uL   Eosinophils Relative 3 %   Eosinophils Absolute 0.2 0.0 - 0.5 K/uL   Basophils Relative 0 %   Basophils Absolute 0.0 0.0 - 0.1 K/uL   Immature Granulocytes 0 %   Abs Immature Granulocytes 0.02 0.00 - 0.07 K/uL    Comment: Performed at [facility], [place] [address] [place], [place] [address]  Magnesium     Status: None   Collection Time: 07/28/22  3:39 AM  Result Value Ref Range   Magnesium 1.9 1.7 - 2.4 mg/dL    Comment: Performed at [facility], [place] [address] [place],  [address]  Basic metabolic panel  Status: Abnormal   Collection Time: 07/29/22  4:48 AM  Result Value Ref Range   Sodium 141 135 - 145 mmol/L   Potassium 3.5 3.5 - 5.1 mmol/L   Chloride 98 98 - 111 mmol/L   CO2 35 (H) 22 - 32 mmol/L   Glucose, Bld 101 (H) 70 - 99 mg/dL    Comment: Glucose reference range applies only to samples taken after fasting for at least 8 hours.   BUN 14 8 - 23 mg/dL   Creatinine, Ser 1.01 (H) 0.44 - 1.00 mg/dL   Calcium 8.7 (L) 8.9 - 10.3 mg/dL   GFR, Estimated 60 (L) >60 mL/min    Comment: (NOTE) Calculated using the CKD-EPI Creatinine Equation (2021)    Anion gap 8 5 - 15    Comment: Performed at [facility], [place] [address] [place], [place] [address]  CBC with Differential/Platelet     Status: Abnormal   Collection Time: 07/29/22  4:48 AM  Result Value Ref Range   WBC 5.6 4.0 - 10.5 K/uL   RBC 4.27 3.87 - 5.11 MIL/uL   Hemoglobin 12.6 12.0 - 15.0 g/dL   HCT 40.7 36.0 - 46.0 %   MCV 95.3 80.0 - 100.0 fL   MCH 29.5 26.0 - 34.0 pg   MCHC 31.0 30.0 - 36.0 g/dL   RDW 15.9 (H) 11.5 - 15.5 %   Platelets 206 150 - 400 K/uL   nRBC 0.0 0.0 - 0.2 %   Neutrophils Relative % 60 %   Neutro Abs 3.4 1.7 - 7.7 K/uL   Lymphocytes Relative 23 %   Lymphs Abs 1.3 0.7 - 4.0 K/uL   Monocytes Relative  13 %   Monocytes Absolute 0.7 0.1 - 1.0 K/uL   Eosinophils Relative 3 %   Eosinophils Absolute 0.2 0.0 - 0.5 K/uL   Basophils Relative 1 %   Basophils Absolute 0.0 0.0 - 0.1 K/uL   Immature Granulocytes 0 %   Abs Immature Granulocytes 0.02 0.00 - 0.07 K/uL    Comment: Performed at [facility], [place] [address], [place], [place] [address]    ECG   Afib at 27 - Personally Reviewed  Telemetry   Afib with CVR - Personally Reviewed  Radiology   ECHOCARDIOGRAM COMPLETE  Result Date: 07/27/2022    ECHOCARDIOGRAM REPORT   Patient Name:   DONEISHA IVEY Date of Exam: 07/27/2022 Medical Rec #:  [id number]          Height:       66.5 in Accession #:    [id number]         Weight:       298.1 lb Date of Birth:  April 28, 1951          BSA:          2.383 m Patient Age:    52 years           BP:           111/70 mmHg Patient Gender: F                  HR:           79 bpm. Exam Location:  Inpatient Procedure: 2D Echo and Intracardiac Opacification Agent Indications:    CHF  History:        Patient has prior history of Echocardiogram examinations, most                 recent 11/05/2018.  Risk Factors:Hypertension.  Sonographer:    Harvie Junior Referring Phys: [phone] JARED M GARDNER  Sonographer Comments: Technically difficult study due to poor echo windows and patient is obese. Image acquisition challenging due to patient body habitus. IMPRESSIONS  1. Left ventricular ejection fraction, by estimation, is 50 to 55%. The left ventricle has low normal function. The left ventricle has no regional wall motion abnormalities. Left ventricular diastolic parameters are indeterminate.  2. Right ventricular systolic function is severely reduced. The right ventricular size is severely enlarged. There is moderately elevated pulmonary artery systolic pressure.  3. Left atrial size was mildly dilated.  4. Right atrial size was severely dilated.  5. The mitral valve is abnormal. Mild mitral valve regurgitation.  No evidence of mitral stenosis.  6. Tricuspid valve regurgitation is severe.  7. The aortic valve is tricuspid. There is moderate calcification of the aortic valve. There is moderate thickening of the aortic valve. Aortic valve regurgitation is trivial. Aortic valve sclerosis/calcification is present, without any evidence of aortic stenosis.  8. The inferior vena cava is dilated in size with >50% respiratory variability, suggesting right atrial pressure of 8 mmHg. FINDINGS  Left Ventricle: Left ventricular ejection fraction, by estimation, is 50 to 55%. The left ventricle has low normal function. The left ventricle has no regional wall motion abnormalities. Definity contrast agent was given IV to delineate the left ventricular endocardial borders. The left ventricular internal cavity size was normal in size. There is no left ventricular hypertrophy. Left ventricular diastolic parameters are indeterminate. Right Ventricle: The right ventricular size is severely enlarged. No increase in right ventricular wall thickness. Right ventricular systolic function is severely reduced. There is moderately elevated pulmonary artery systolic pressure. The tricuspid regurgitant velocity is 3.36 m/s, and with an assumed right atrial pressure of 3 mmHg, the estimated right ventricular systolic pressure is 54.2 mmHg. Left Atrium: Left atrial size was mildly dilated. Right Atrium: Right atrial size was severely dilated. Pericardium: Trivial pericardial effusion is present. The pericardial effusion is posterior to the left ventricle. Mitral Valve: The mitral valve is abnormal. There is mild thickening of the mitral valve leaflet(s). There is mild calcification of the mitral valve leaflet(s). Mild mitral valve regurgitation. No evidence of mitral valve stenosis. Tricuspid Valve: The tricuspid valve is normal in structure. Tricuspid valve regurgitation is severe. No evidence of tricuspid stenosis. Aortic Valve: The aortic valve is  tricuspid. There is moderate calcification of the aortic valve. There is moderate thickening of the aortic valve. Aortic valve regurgitation is trivial. Aortic regurgitation PHT measures 337 msec. Aortic valve sclerosis/calcification is present, without any evidence of aortic stenosis. Aortic valve mean gradient measures 2.0 mmHg. Aortic valve peak gradient measures 3.7 mmHg. Aortic valve area, by VTI measures 2.15 cm. Pulmonic Valve: The pulmonic valve was normal in structure. Pulmonic valve regurgitation is mild. No evidence of pulmonic stenosis. Aorta: The aortic root is normal in size and structure. Venous: The inferior vena cava is dilated in size with greater than 50% respiratory variability, suggesting right atrial pressure of 8 mmHg. IAS/Shunts: No atrial level shunt detected by color flow Doppler.  LEFT VENTRICLE PLAX 2D LVIDd:         5.00 cm     Diastology LVIDs:         3.80 cm     LV e' medial:    7.94 cm/s LV PW:         0.90 cm     LV E/e' medial:  14.3 LV IVS:  0.90 cm     LV e' lateral:   13.40 cm/s LVOT diam:     2.00 cm     LV E/e' lateral: 8.5 LV SV:         36 LV SV Index:   15 LVOT Area:     3.14 cm  LV Volumes (MOD) LV vol d, MOD A2C: 78.2 ml LV vol d, MOD A4C: 72.6 ml LV vol s, MOD A2C: 47.7 ml LV vol s, MOD A4C: 34.4 ml LV SV MOD A2C:     30.6 ml LV SV MOD A4C:     72.6 ml LV SV MOD BP:      36.0 ml RIGHT VENTRICLE RV Basal diam:  5.10 cm RV Mid diam:    4.20 cm RV S prime:     10.90 cm/s TAPSE (M-mode): 1.6 cm LEFT ATRIUM             Index        RIGHT ATRIUM           Index LA diam:        4.50 cm 1.89 cm/m   RA Area:     30.00 cm LA Vol (A2C):   82.4 ml 34.58 ml/m  RA Volume:   108.00 ml 45.32 ml/m LA Vol (A4C):   85.8 ml 36.01 ml/m LA Biplane Vol: 92.9 ml 38.99 ml/m  AORTIC VALVE                    PULMONIC VALVE AV Area (Vmax):    2.19 cm     PV Vmax:          1.12 m/s AV Area (Vmean):   2.28 cm     PV Peak grad:     5.0 mmHg AV Area (VTI):     2.15 cm     PR End Diast  Vel: 7.40 msec AV Vmax:           96.50 cm/s AV Vmean:          59.100 cm/s AV VTI:            0.168 m AV Peak Grad:      3.7 mmHg AV Mean Grad:      2.0 mmHg LVOT Vmax:         67.30 cm/s LVOT Vmean:        42.800 cm/s LVOT VTI:          0.115 m LVOT/AV VTI ratio: 0.68 AI PHT:            337 msec  AORTA Ao Root diam: 3.10 cm Ao Asc diam:  3.30 cm MITRAL VALVE                TRICUSPID VALVE MV Area (PHT): 3.58 cm     TR Peak grad:   45.2 mmHg MV Decel Time: 212 msec     TR Mean grad:   26.0 mmHg MR Peak grad: 30.7 mmHg     TR Vmax:        336.00 cm/s MR Vmax:      277.00 cm/s   TR Vmean:       241.0 cm/s MV E velocity: 113.25 cm/s MV A velocity: 116.00 cm/s  SHUNTS MV E/A ratio:  0.98         Systemic VTI:  0.12 m  Systemic Diam: 2.00 cm Jenkins Rouge MD Electronically signed by Jenkins Rouge MD Signature Date/Time: 07/27/2022/3:45:31 PM    Final     Cardiac Studies   See echo above  Impression   Principal Problem:   Acute on chronic diastolic (congestive) heart failure (HCC) Active Problems:   Morbid obesity (HCC)   Paroxysmal atrial fibrillation (HCC)   OSA on CPAP   CKD (chronic kidney disease) stage 3, GFR 30-59 ml/min (HCC)   Chronic pain disorder   RUQ abdominal pain   Recommendation   Ms. Cuadras presents with acute on chronic heart failure with preserved ejection fraction although her LVEF is lower than previously now at 50 to 55% (was 60 to 65%).  Primarily her issue is right heart failure.  She has had weight gain and noncompliance with CPAP over the past year which I think is contributing to this.  She has also been in and out of atrial fibrillation but has been rate controlled on Eliquis.  She has been diuresed about 8 L negative over the past several days and appears close to euvolemic.  I will switch her to oral Lasix again today 40 mg daily.  We will add Entresto 24/26 mg twice daily which does have indication for heart failure with preserved ejection  fraction and may provide some biventricular benefit.  She does need to reestablish her use of regular CPAP.  Renal function is normalized.  Her weight is now at baseline as to when she was discharged recently from her prior hospitalization.  Suspect she may be close to DC, possibly tomorrow.  Thanks for the kind referral.  She will ultimately follow-up with Dr. Acie Fredrickson.  Time Spent Directly with Patient:  I have spent a total of 45 minutes with the patient reviewing hospital notes, telemetry, EKGs, labs and examining the patient as well as establishing an assessment and plan that was discussed personally with the patient.  > 50% of time was spent in direct patient care.  Length of Stay:  LOS: 0 days   Pixie Casino, MD, [facility], [place] Director of the Advanced Lipid Disorders &  Cardiovascular Risk Reduction Clinic Diplomate of the American Board of Clinical Lipidology Attending Cardiologist  Direct Dial: [phone]  Fax: [phone]  Website:  [email or web address] 07/29/2022, 8:28 AM

## 2022-07-29 NOTE — TOC Progression Note (Signed)
Transition of Care (TOC) - Progression Note  TOC order received for Heart Failure Home Health Screening. Patient has been reviewed and does not meet criteria.    Patient Details  Name: Kathy [name] MRN: [id number] Date of Birth: 05-10-1951  Transition of Care [facility]) CM/SW Contact  Henrietta Dine, RN Phone Number: 07/29/2022, 4:28 PM  Clinical Narrative:       Expected Discharge Plan: Home/Self Care Barriers to Discharge: Continued Medical Work up  Expected Discharge Plan and Services Expected Discharge Plan: Home/Self Care   Discharge Planning Services: CM Consult   Living arrangements for the past 2 months: Single Family Home                           HH Arranged: PT, OT [place] Agency: [place]         Social Determinants of Health (SDOH) Interventions    Readmission Risk Interventions     No data to display

## 2022-07-29 NOTE — Progress Notes (Signed)
Mobility Specialist - Progress Note   07/29/22 1049  Mobility  Activity Ambulated with assistance in hallway  Level of Assistance Standby assist, set-up cues, supervision of patient - no hands on  Assistive Device Front wheel walker  Distance Ambulated (ft) 120 ft  Activity Response Tolerated well  Mobility Referral Yes  $Mobility charge 1 Mobility   Pt received in bed and agreeable to mobility. Pt a little SOB towards EOS. No complaints during mobility. Pt to bed after session with all needs met.      [facility]

## 2022-07-29 NOTE — Progress Notes (Signed)
PROGRESS NOTE  Kathy [name] ZJI:[id number] DOB: Nov 11, 1950 DOA: 07/26/2022 PCP: Isaac Bliss, Rayford Halsted, MD  HPI/Recap of past 24 hours: Kathy [name] is a 71 y.o. female with medical history significant of HFpEF, HTN, morbid obesity, OSA on CPAP, A.Fib on eliquis presented to the ED c/o dyspnea, orthopnea with 8lb wt gain for the past couple of days. Patient also c/o persistent R sided flank pain, which she describes as sharp, denies any radiation or association.    Today, patient denied any new complaints, son at bedside.     Assessment/Plan: Principal Problem:   Acute on chronic diastolic (congestive) heart failure (HCC) Active Problems:   CKD (chronic kidney disease) stage 3, GFR 30-59 ml/min (HCC)   RUQ abdominal pain   Morbid obesity (HCC)   Paroxysmal atrial fibrillation (HCC)   OSA on CPAP   Chronic pain disorder   Acute on chronic diastolic (congestive) heart failure (HCC) Right-sided heart failure Pulmonary hypertension Currently on room air BNP 271 Troponin negative D-dimer 0.3 Echo showed EF of 50 to 55%, no regional wall motion abnormality, left ventricular diastolic parameters were indeterminate.  Right ventricular systolic function is severely reduced, severely enlarged, moderately elevated pulmonary artery systolic pressure, right atrial size is severely dilated, tricuspid valve regurgitation is severe Cardiology consulted, switched to p.o. Lasix Continue toprol, diltiazem Strict I's and O's, daily weights Telemetry  Hypokalemia Replace as needed  Right flank/RUQ pain Possibly related to right-sided heart failure CT abdomen/pelvis shows hepatic steatosis, CT renal stone negative Pt is s/p cholecystectomy LFTs WNL RUQ ultrasound unremarkable Monitor   CKD (chronic kidney disease) stage 3a Creatinine around baseline Monitor closely while diuresing Daily BMP   Chronic pain disorder Cont home meds including PRN narcotic.   OSA on  CPAP CPAP QHS, advised and educated   Paroxysmal atrial fibrillation ([place]) Rate controlled Cont cardizem, toprol, and eliquis   Morbid obesity ([place]) Lifestyle modification advised    Estimated body mass index is 46.44 kg/m as calculated from the following:   Height as of this encounter: 5' 6.5" (1.689 m).   Weight as of this encounter: 132.5 kg.     Code Status: Full  Family Communication: Son at bedside  Disposition Plan: Status is: Inpatient The patient will require care spanning > 2 midnights and should be moved to inpatient because: Level of care     Consultants: Cardiology  Procedures: None  Antimicrobials: None  DVT prophylaxis: Eliquis   Objective: Vitals:   07/28/22 1948 07/29/22 0500 07/29/22 0524 07/29/22 1256  BP: (!) 142/88  (!) 161/75 (!) 153/96  Pulse: 71  68 82  Resp: '17  17 20  '$ Temp: 98.1 F (36.7 C)  98 F (36.7 C) 98 F (36.7 C)  TempSrc: Oral  Oral Oral  SpO2:   100% 96%  Weight:  132.5 kg    Height:        Intake/Output Summary (Last 24 hours) at 07/29/2022 1657 Last data filed at 07/29/2022 1500 Gross per 24 hour  Intake 3 ml  Output 1000 ml  Net -997 ml   Filed Weights   07/27/22 0602 07/28/22 0446 07/29/22 0500  Weight: 135.2 kg 134.6 kg 132.5 kg    Exam: General: NAD  Cardiovascular: S1, S2 present Respiratory: Diminished breath sounds bilaterally Abdomen: Soft, +tender, nondistended, bowel sounds present Musculoskeletal: Trace bilateral pedal edema noted Skin: Normal Psychiatry: Normal mood     Data Reviewed: CBC: Recent Labs  Lab 07/24/22 1337 07/26/22 1025 07/28/22 0339 07/29/22  0448  WBC 9.3 6.3 6.0 5.6  NEUTROABS  --   --  3.5 3.4  HGB 12.6 12.0 12.4 12.6  HCT 40.9 38.5 40.3 40.7  MCV 96.5 95.3 95.5 95.3  PLT 213 201 212 244   Basic Metabolic Panel: Recent Labs  Lab 07/24/22 1337 07/26/22 1025 07/27/22 0347 07/28/22 0339 07/29/22 0448  NA 136 137 142 139 141  K 4.0 3.8 4.1 3.1* 3.5   CL 98 97* 99 97* 98  CO2 26 30 33* 30 35*  GLUCOSE 94 108* 114* 117* 101*  BUN 28* 32* 28* 19 14  CREATININE 1.32* 1.38* 1.24* 1.05* 1.01*  CALCIUM 9.2 9.0 8.9 8.7* 8.7*  MG  --   --   --  1.9  --    GFR: Estimated Creatinine Clearance: 73.1 mL/min (A) (by C-G formula based on SCr of 1.01 mg/dL (H)). Liver Function Tests: Recent Labs  Lab 07/24/22 1337 07/26/22 1025  AST 20 18  ALT 14 15  ALKPHOS 77 71  BILITOT 1.2 1.0  PROT 6.8 6.7  ALBUMIN 4.2 4.1   Recent Labs  Lab 07/24/22 1337 07/26/22 1025  LIPASE 32 51   No results for input(s): "AMMONIA" in the last 168 hours. Coagulation Profile: No results for input(s): "INR", "PROTIME" in the last 168 hours.  Cardiac Enzymes: No results for input(s): "CKTOTAL", "CKMB", "CKMBINDEX", "TROPONINI" in the last 168 hours. BNP (last 3 results) No results for input(s): "PROBNP" in the last 8760 hours. HbA1C: No results for input(s): "HGBA1C" in the last 72 hours. CBG: Recent Labs  Lab 07/27/22 1136 07/27/22 1626  GLUCAP 93 74   Lipid Profile: No results for input(s): "CHOL", "HDL", "LDLCALC", "TRIG", "CHOLHDL", "LDLDIRECT" in the last 72 hours. Thyroid Function Tests: No results for input(s): "TSH", "T4TOTAL", "FREET4", "T3FREE", "THYROIDAB" in the last 72 hours. Anemia Panel: No results for input(s): "VITAMINB12", "FOLATE", "FERRITIN", "TIBC", "IRON", "RETICCTPCT" in the last 72 hours. Urine analysis:    Component Value Date/Time   COLORURINE YELLOW 07/26/2022 1238   APPEARANCEUR CLEAR 07/26/2022 1238   LABSPEC 1.017 07/26/2022 1238   PHURINE 5.0 07/26/2022 1238   GLUCOSEU NEGATIVE 07/26/2022 1238   HGBUR NEGATIVE 07/26/2022 1238   BILIRUBINUR NEGATIVE 07/26/2022 1238   BILIRUBINUR Positive 06/26/2022 1444   KETONESUR NEGATIVE 07/26/2022 1238   PROTEINUR NEGATIVE 07/26/2022 1238   UROBILINOGEN 0.2 06/26/2022 1444   UROBILINOGEN 1.0 03/28/2012 1649   NITRITE NEGATIVE 07/26/2022 1238   LEUKOCYTESUR SMALL (A)  07/26/2022 1238   Sepsis Labs: '@LABRCNTIP'$ (procalcitonin:4,lacticidven:4)  ) Recent Results (from the past 240 hour(s))  Urine Culture     Status: Abnormal   Collection Time: 07/24/22  1:37 PM   Specimen: Urine, Clean Catch  Result Value Ref Range Status   Specimen Description   Final    URINE, CLEAN CATCH Performed at [facility], [address], [place], [place] [address]    Special Requests   Final    NONE Performed at [facility], [address] [place], [place] [address]    Culture (A)  Final    20,000 COLONIES/mL MULTIPLE SPECIES PRESENT, SUGGEST RECOLLECTION   Report Status 07/25/2022 FINAL  Final      Studies: No results found.  Scheduled Meds:  apixaban  5 mg Oral BID   diltiazem  120 mg Oral Daily   FLUoxetine  40 mg Oral Daily   furosemide  40 mg Oral Daily   metoprolol succinate  100 mg Oral Daily   pantoprazole  80  mg Oral Daily   sacubitril-valsartan  1 tablet Oral BID   sodium chloride flush  3 mL Intravenous Q12H    Continuous Infusions:  sodium chloride       LOS: 0 days     Alma Friendly, MD [facility]  If 7PM-7AM, please contact night-coverage [email or web address] 07/29/2022, 4:57 PM

## 2022-07-30 LAB — BASIC METABOLIC PANEL
Anion gap: 12 (ref 5–15)
BUN: 14 mg/dL (ref 8–23)
CO2: 29 mmol/L (ref 22–32)
Calcium: 8.8 mg/dL — ABNORMAL LOW (ref 8.9–10.3)
Chloride: 98 mmol/L (ref 98–111)
Creatinine, Ser: 0.96 mg/dL (ref 0.44–1.00)
GFR, Estimated: >60 mL/min (ref >60)
Glucose, Bld: 104 mg/dL — ABNORMAL HIGH (ref 70–99)
Potassium: 3.4 mmol/L — ABNORMAL LOW (ref 3.5–5.1)
Sodium: 139 mmol/L (ref 135–145)

## 2022-07-30 MED ORDER — FUROSEMIDE 40 MG PO TABS: 40.0000 mg | ORAL_TABLET | Freq: Every day | ORAL | 1 refills | Status: DC

## 2022-07-30 MED ORDER — POTASSIUM CHLORIDE CRYS ER 20 MEQ PO TBCR
40.0000 meq | EXTENDED_RELEASE_TABLET | Freq: Once | ORAL | Status: AC
  Administered 2022-07-30: 40 meq via ORAL
  Filled 2022-07-30: qty 2

## 2022-07-30 MED ORDER — ONDANSETRON 4 MG PO TBDP
4.0000 mg | ORAL_TABLET | Freq: Four times a day (QID) | ORAL | Status: DC | PRN
  Administered 2022-07-30: 4 mg via ORAL
  Filled 2022-07-30: qty 1

## 2022-07-30 MED ORDER — SACUBITRIL-VALSARTAN 24-26 MG PO TABS: 1.0000 | ORAL_TABLET | Freq: Two times a day (BID) | ORAL | 0 refills | Status: DC

## 2022-07-30 NOTE — TOC Transition Note (Addendum)
Transition of Care [facility]) - CM/SW Discharge Note   Patient Details  Name: MAKEBA DELCASTILLO MRN: [id number] Date of Birth: Apr 06, 1951  [facility]) CM/SW Contact:  Henrietta Dine, RN Phone Number: 07/30/2022, 1:18 PM   Clinical Narrative:    [facility] consult for medication assistance; spoke with pt in room; the pt says that she is from home and plans to return; she has transportation home and to her appts; she says she has reading glasses and a walker; the pt says her cardiologist told her that she is eligible for her first RX for Entresco is free; pt provided 30-day free trial offer coupon for medication; She will have prescription filled; also note pt active with [facility] for PT/OT; the pt says she was hospitalized before hearing from them; notified Cheryl at [facility] of pt d/c today and provided pt cell phone # [phone]; no additional TOC needs.   Final next level of care: Home/Self Care Barriers to Discharge: No Barriers Identified   Patient Goals and CMS Choice Patient states their goals for this hospitalization and ongoing recovery are:: to go home CMS Medicare.gov Compare Post Acute Care list provided to:: Patient    Discharge Placement                       Discharge Plan and Services   Discharge Planning Services: CM Consult                      HH Arranged: PT, OT [place] Agency: [place]        Social Determinants of Health (SDOH) Interventions     Readmission Risk Interventions     No data to display

## 2022-07-30 NOTE — Discharge Instructions (Signed)
Patient given 30-day free trial offer coupon for Entresco.

## 2022-07-30 NOTE — Progress Notes (Signed)
DAILY PROGRESS NOTE   Patient Name: Kathy [name] Date of Encounter: 07/30/2022 Cardiologist: Mertie Moores, MD  Chief Complaint   Breathing better  Patient Profile   71 yo female presenting with shortness of breath, history of HFpEF, HTN, OSA on CPAP   Subjective   No events overnight- switched to po lasix. Started Entresto. She was net negative another 1L overnight- creatinine further improved, now at 0.96. K+ 3.4 today.  Objective   Vitals:   07/29/22 1256 07/29/22 2010 07/29/22 2259 07/30/22 0651  BP: (!) 153/96 129/87  121/79  Pulse: 82 71  77  Resp: '20 18 18 18  '$ Temp: 98 F (36.7 C) 98.4 F (36.9 C)  97.8 F (36.6 C)  TempSrc: Oral Oral  Oral  SpO2: 96% 97%  96%  Weight:      Height:        Intake/Output Summary (Last 24 hours) at 07/30/2022 0943 Last data filed at 07/29/2022 1705 Gross per 24 hour  Intake --  Output 1100 ml  Net -1100 ml   Filed Weights   07/27/22 0602 07/28/22 0446 07/29/22 0500  Weight: 135.2 kg 134.6 kg 132.5 kg    Physical Exam   General appearance: alert, no distress, and morbidly obese Lungs: clear to auscultation bilaterally Heart: irregularly irregular rhythm Extremities: extremities normal, atraumatic, no cyanosis or edema Neurologic: Grossly normal  Inpatient Medications    Scheduled Meds:  apixaban  5 mg Oral BID   diltiazem  120 mg Oral Daily   FLUoxetine  40 mg Oral Daily   furosemide  40 mg Oral Daily   metoprolol succinate  100 mg Oral Daily   pantoprazole  80 mg Oral Daily   potassium chloride  40 mEq Oral Once   sacubitril-valsartan  1 tablet Oral BID   sodium chloride flush  3 mL Intravenous Q12H    Continuous Infusions:  sodium chloride      PRN Meds: sodium chloride, acetaminophen, ALPRAZolam, HYDROcodone-acetaminophen, methocarbamol, ondansetron, mouth rinse, polyvinyl alcohol, sodium chloride flush   Labs   Results for orders placed or performed during the hospital encounter of  07/26/22 (from the past 48 hour(s))  Basic metabolic panel     Status: Abnormal   Collection Time: 07/29/22  4:48 AM  Result Value Ref Range   Sodium 141 135 - 145 mmol/L   Potassium 3.5 3.5 - 5.1 mmol/L   Chloride 98 98 - 111 mmol/L   CO2 35 (H) 22 - 32 mmol/L   Glucose, Bld 101 (H) 70 - 99 mg/dL    Comment: Glucose reference range applies only to samples taken after fasting for at least 8 hours.   BUN 14 8 - 23 mg/dL   Creatinine, Ser 1.01 (H) 0.44 - 1.00 mg/dL   Calcium 8.7 (L) 8.9 - 10.3 mg/dL   GFR, Estimated 60 (L) >60 mL/min    Comment: (NOTE) Calculated using the CKD-EPI Creatinine Equation (2021)    Anion gap 8 5 - 15    Comment: Performed at [facility], [place] [address], [place], [place] [address]  CBC with Differential/Platelet     Status: Abnormal   Collection Time: 07/29/22  4:48 AM  Result Value Ref Range   WBC 5.6 4.0 - 10.5 K/uL   RBC 4.27 3.87 - 5.11 MIL/uL   Hemoglobin 12.6 12.0 - 15.0 g/dL   HCT 40.7 36.0 - 46.0 %   MCV 95.3 80.0 - 100.0 fL   MCH 29.5 26.0 - 34.0  pg   MCHC 31.0 30.0 - 36.0 g/dL   RDW 15.9 (H) 11.5 - 15.5 %   Platelets 206 150 - 400 K/uL   nRBC 0.0 0.0 - 0.2 %   Neutrophils Relative % 60 %   Neutro Abs 3.4 1.7 - 7.7 K/uL   Lymphocytes Relative 23 %   Lymphs Abs 1.3 0.7 - 4.0 K/uL   Monocytes Relative 13 %   Monocytes Absolute 0.7 0.1 - 1.0 K/uL   Eosinophils Relative 3 %   Eosinophils Absolute 0.2 0.0 - 0.5 K/uL   Basophils Relative 1 %   Basophils Absolute 0.0 0.0 - 0.1 K/uL   Immature Granulocytes 0 %   Abs Immature Granulocytes 0.02 0.00 - 0.07 K/uL    Comment: Performed at [facility], [place] [address], [place], [place] [address]  Basic metabolic panel     Status: Abnormal   Collection Time: 07/30/22  4:07 AM  Result Value Ref Range   Sodium 139 135 - 145 mmol/L   Potassium 3.4 (L) 3.5 - 5.1 mmol/L   Chloride 98 98 - 111 mmol/L   CO2 29 22 - 32 mmol/L   Glucose, Bld 104 (H) 70 - 99 mg/dL     Comment: Glucose reference range applies only to samples taken after fasting for at least 8 hours.   BUN 14 8 - 23 mg/dL   Creatinine, Ser 0.96 0.44 - 1.00 mg/dL   Calcium 8.8 (L) 8.9 - 10.3 mg/dL   GFR, Estimated >60 >60 mL/min    Comment: (NOTE) Calculated using the CKD-EPI Creatinine Equation (2021)    Anion gap 12 5 - 15    Comment: Performed at [facility], [place] [address], [place], [place] [address]    ECG   N/A  Telemetry   Afib with CVR - Personally Reviewed  Radiology    No results found.  Cardiac Studies   N/A  Assessment   Principal Problem:   Acute on chronic diastolic (congestive) heart failure (HCC) Active Problems:   Morbid obesity (HCC)   Paroxysmal atrial fibrillation (HCC)   OSA on CPAP   CKD (chronic kidney disease) stage 3, GFR 30-59 ml/min (HCC)   Chronic pain disorder   RUQ abdominal pain   Plan   Additional diuresis overnight- creatinine lower - suspect this is a new baseline since she has been adequately diuresed. Tolerating Entresto. She should get a free 30 day Rx card and will need a PA to continue this - can be arranged through Dr. Acie Fredrickson. Will message scheduling to arrange follow-up with him. Replete potassium. Ok to d/c home today from my standpoint.  Time Spent Directly with Patient:  I have spent a total of 25 minutes with the patient reviewing hospital notes, telemetry, EKGs, labs and examining the patient as well as establishing an assessment and plan that was discussed personally with the patient.  > 50% of time was spent in direct patient care.  Length of Stay:  LOS: 1 day   Pixie Casino, MD, [facility], [place] Director of the Advanced Lipid Disorders &  Cardiovascular Risk Reduction Clinic Diplomate of the American Board of Clinical Lipidology Attending Cardiologist  Direct Dial: [phone]  Fax: [phone]  Website:  [email or web address] 07/30/2022, 9:43 AM

## 2022-07-30 NOTE — Plan of Care (Signed)

## 2022-07-30 NOTE — Discharge Summary (Signed)
Physician Discharge Summary   Patient: Kathy [name] MRN: [id number] DOB: 1951-05-04  Admit date:     07/26/2022  Discharge date: 07/30/22  Discharge Physician: Alma Friendly   PCP: Isaac Bliss, Rayford Halsted, MD   Recommendations at discharge:   Follow-up with PCP in 1 week Follow-up with cardiology as scheduled  Discharge Diagnoses: Principal Problem:   Acute on chronic diastolic (congestive) heart failure (HCC) Active Problems:   CKD (chronic kidney disease) stage 3, GFR 30-59 ml/min (HCC)   RUQ abdominal pain   Morbid obesity (HCC)   Paroxysmal atrial fibrillation (HCC)   OSA on CPAP   Chronic pain disorder    Hospital Course: Kathy [name] is a 71 y.o. female with medical history significant of HFpEF, HTN, morbid obesity, OSA on CPAP, A.Fib on eliquis presented to the ED c/o dyspnea, orthopnea with 8lb wt gain for the past couple of days. Patient also c/o persistent R sided flank pain, which she describes as sharp, denies any radiation or association.  Patient was adequately diuresed.  Advised to be compliant with his CPAP.    Assessment and Plan:  Acute on chronic diastolic (congestive) heart failure Right-sided heart failure Pulmonary hypertension Currently on room air BNP 271 Troponin negative D-dimer 0.3 Echo showed EF of 50 to 55%, no regional wall motion abnormality, left ventricular diastolic parameters were indeterminate.  Right ventricular systolic function is severely reduced, severely enlarged, moderately elevated pulmonary artery systolic pressure, right atrial size is severely dilated, tricuspid valve regurgitation is severe Cardiology consulted, diuresed about 9L, lost about 7lbs Switched to p.o. Lasix Continue toprol, diltiazem Outpatient follow-up with cardiology   Hypokalemia Replaced as needed   Right flank/RUQ pain Possibly related to right-sided heart failure CT abdomen/pelvis shows hepatic steatosis, CT renal stone  negative Pt is s/p cholecystectomy LFTs WNL RUQ ultrasound unremarkable   CKD (chronic kidney disease) stage 3a Creatinine around baseline Follow up with PCP   Chronic pain disorder Cont home meds including PRN narcotic.   OSA on CPAP CPAP QHS, advised and educated   Paroxysmal atrial fibrillation ([place]) Rate controlled Cont cardizem, toprol, and eliquis   Morbid obesity ([place]) Lifestyle modification advised     Consultants: Cardiology Procedures performed: None Disposition: Home Diet recommendation:  Discharge Diet Orders (From admission, onward)     Start     Ordered   07/30/22 0000  Diet - low sodium heart healthy        07/30/22 1218           Cardiac diet    DISCHARGE MEDICATION: Allergies as of 07/30/2022       Reactions   Achromycin [tetracycline Hcl] Shortness Of Breath   Dyspnea   Penicillins    Facial angioedema Did it involve swelling of the face/tongue/throat, SOB, or low BP? Yes Did it involve sudden or severe rash/hives, skin peeling, or any reaction on the inside of your mouth or nose? No Did you need to seek medical attention at a hospital or doctor's office? No When did it last happen?      30+ years If all above answers are "NO", may proceed with cephalosporin use.   Sulfamethoxazole-trimethoprim Hives   Nickel Itching   Zanaflex [tizanidine Hydrochloride]    Causes more pain         Medication List     STOP taking these medications    ciprofloxacin 500 MG tablet Commonly known as: CIPRO       TAKE these medications  ALPRAZolam 1 MG tablet Commonly known as: XANAX TAKE 1 TABLET BY MOUTH THREE TIMES A DAY AS NEEDED FOR ANXIETY What changed:  how much to take how to take this when to take this reasons to take this additional instructions   apixaban 5 MG Tabs tablet Commonly known as: Eliquis Take 1 tablet (5 mg total) by mouth 2 (two) times daily.   atorvastatin 40 MG tablet Commonly known as: LIPITOR Take 1  tablet (40 mg total) by mouth daily.   CALCIUM 600 PO Take 1 tablet by mouth daily.   clindamycin 1 % lotion Commonly known as: CLEOCIN T Apply 1 application topically 2 (two) times daily as needed (rosacea (skin irritation)).   clobetasol 0.05 % external solution Commonly known as: TEMOVATE Apply 1 application topically daily as needed (rosacea).   cyclobenzaprine 5 MG tablet Commonly known as: FLEXERIL Take 5 mg by mouth at bedtime as needed for muscle spasms.   diltiazem 120 MG 24 hr capsule Commonly known as: CARDIZEM CD Take 1 capsule (120 mg total) by mouth daily.   fluocinonide 0.05 % external solution Commonly known as: LIDEX Apply 1 application topically daily as needed (rosacea).   FLUoxetine 40 MG capsule Commonly known as: PROZAC TAKE 1 CAPSULE BY MOUTH EVERY DAY What changed: how much to take   furosemide 40 MG tablet Commonly known as: LASIX Take 1 tablet (40 mg total) by mouth daily. What changed: See the new instructions.   gabapentin 300 MG capsule Commonly known as: NEURONTIN Take 300 mg by mouth at bedtime. TAKE 1 CAPSULE BY MOUTH EVERY DAY AT BEDTIME FOR 30 DAYS   HYDROcodone-acetaminophen 5-325 MG tablet Commonly known as: NORCO/VICODIN Take 1 tablet by mouth every 6 (six) hours as needed for moderate pain.   methocarbamol 500 MG tablet Commonly known as: ROBAXIN Take 500 mg by mouth every 8 (eight) hours as needed.   metoprolol succinate 100 MG 24 hr tablet Commonly known as: TOPROL-XL TAKE 1 TABLET BY MOUTH EVERY DAY   metroNIDAZOLE 0.75 % cream Commonly known as: METROCREAM Apply 1 application topically 2 (two) times daily as needed (rosacea).   multivitamin with minerals Tabs tablet Take 1 tablet by mouth daily. Centrum Silver   omeprazole 20 MG capsule Commonly known as: PRILOSEC Take 2 capsules (40 mg total) by mouth daily.   potassium chloride 10 MEQ tablet Commonly known as: KLOR-CON Take 1 tablet (10 mEq total) by mouth  daily.   sacubitril-valsartan 24-26 MG Commonly known as: ENTRESTO Take 1 tablet by mouth 2 (two) times daily.        Follow-up Information     Isaac Bliss, Rayford Halsted, MD. Schedule an appointment as soon as possible for a visit in 1 week(s).   Specialty: Internal Medicine Contact information: [place] [address] [phone]                Discharge Exam: Filed Weights   07/27/22 0602 07/28/22 0446 07/29/22 0500  Weight: 135.2 kg 134.6 kg 132.5 kg   General: NAD  Cardiovascular: S1, S2 present Respiratory: CTAB Abdomen: Soft, nontender, nondistended, bowel sounds present Musculoskeletal: No bilateral pedal edema noted Skin: Normal Psychiatry: Normal mood   Condition at discharge: stable  The results of significant diagnostics from this hospitalization (including imaging, microbiology, ancillary and laboratory) are listed below for reference.   Imaging Studies: ECHOCARDIOGRAM COMPLETE  Result Date: 07/27/2022    ECHOCARDIOGRAM REPORT   Patient Name:   EARNSTINE MEINDERS Date of  Exam: 07/27/2022 Medical Rec #:  [id number]          Height:       66.5 in Accession #:    [id number]         Weight:       298.1 lb Date of Birth:  1951-08-12          BSA:          2.383 m Patient Age:    30 years           BP:           111/70 mmHg Patient Gender: F                  HR:           79 bpm. Exam Location:  Inpatient Procedure: 2D Echo and Intracardiac Opacification Agent Indications:    CHF  History:        Patient has prior history of Echocardiogram examinations, most                 recent 11/05/2018. Risk Factors:Hypertension.  Sonographer:    Harvie Junior Referring Phys: [phone] JARED M GARDNER  Sonographer Comments: Technically difficult study due to poor echo windows and patient is obese. Image acquisition challenging due to patient body habitus. IMPRESSIONS  1. Left ventricular ejection fraction, by estimation, is 50 to 55%. The left ventricle has low  normal function. The left ventricle has no regional wall motion abnormalities. Left ventricular diastolic parameters are indeterminate.  2. Right ventricular systolic function is severely reduced. The right ventricular size is severely enlarged. There is moderately elevated pulmonary artery systolic pressure.  3. Left atrial size was mildly dilated.  4. Right atrial size was severely dilated.  5. The mitral valve is abnormal. Mild mitral valve regurgitation. No evidence of mitral stenosis.  6. Tricuspid valve regurgitation is severe.  7. The aortic valve is tricuspid. There is moderate calcification of the aortic valve. There is moderate thickening of the aortic valve. Aortic valve regurgitation is trivial. Aortic valve sclerosis/calcification is present, without any evidence of aortic stenosis.  8. The inferior vena cava is dilated in size with >50% respiratory variability, suggesting right atrial pressure of 8 mmHg. FINDINGS  Left Ventricle: Left ventricular ejection fraction, by estimation, is 50 to 55%. The left ventricle has low normal function. The left ventricle has no regional wall motion abnormalities. Definity contrast agent was given IV to delineate the left ventricular endocardial borders. The left ventricular internal cavity size was normal in size. There is no left ventricular hypertrophy. Left ventricular diastolic parameters are indeterminate. Right Ventricle: The right ventricular size is severely enlarged. No increase in right ventricular wall thickness. Right ventricular systolic function is severely reduced. There is moderately elevated pulmonary artery systolic pressure. The tricuspid regurgitant velocity is 3.36 m/s, and with an assumed right atrial pressure of 3 mmHg, the estimated right ventricular systolic pressure is 98.9 mmHg. Left Atrium: Left atrial size was mildly dilated. Right Atrium: Right atrial size was severely dilated. Pericardium: Trivial pericardial effusion is present. The  pericardial effusion is posterior to the left ventricle. Mitral Valve: The mitral valve is abnormal. There is mild thickening of the mitral valve leaflet(s). There is mild calcification of the mitral valve leaflet(s). Mild mitral valve regurgitation. No evidence of mitral valve stenosis. Tricuspid Valve: The tricuspid valve is normal in structure. Tricuspid valve regurgitation is severe. No evidence of tricuspid stenosis. Aortic Valve: The aortic valve is tricuspid.  There is moderate calcification of the aortic valve. There is moderate thickening of the aortic valve. Aortic valve regurgitation is trivial. Aortic regurgitation PHT measures 337 msec. Aortic valve sclerosis/calcification is present, without any evidence of aortic stenosis. Aortic valve mean gradient measures 2.0 mmHg. Aortic valve peak gradient measures 3.7 mmHg. Aortic valve area, by VTI measures 2.15 cm. Pulmonic Valve: The pulmonic valve was normal in structure. Pulmonic valve regurgitation is mild. No evidence of pulmonic stenosis. Aorta: The aortic root is normal in size and structure. Venous: The inferior vena cava is dilated in size with greater than 50% respiratory variability, suggesting right atrial pressure of 8 mmHg. IAS/Shunts: No atrial level shunt detected by color flow Doppler.  LEFT VENTRICLE PLAX 2D LVIDd:         5.00 cm     Diastology LVIDs:         3.80 cm     LV e' medial:    7.94 cm/s LV PW:         0.90 cm     LV E/e' medial:  14.3 LV IVS:        0.90 cm     LV e' lateral:   13.40 cm/s LVOT diam:     2.00 cm     LV E/e' lateral: 8.5 LV SV:         36 LV SV Index:   15 LVOT Area:     3.14 cm  LV Volumes (MOD) LV vol d, MOD A2C: 78.2 ml LV vol d, MOD A4C: 72.6 ml LV vol s, MOD A2C: 47.7 ml LV vol s, MOD A4C: 34.4 ml LV SV MOD A2C:     30.6 ml LV SV MOD A4C:     72.6 ml LV SV MOD BP:      36.0 ml RIGHT VENTRICLE RV Basal diam:  5.10 cm RV Mid diam:    4.20 cm RV S prime:     10.90 cm/s TAPSE (M-mode): 1.6 cm LEFT ATRIUM              Index        RIGHT ATRIUM           Index LA diam:        4.50 cm 1.89 cm/m   RA Area:     30.00 cm LA Vol (A2C):   82.4 ml 34.58 ml/m  RA Volume:   108.00 ml 45.32 ml/m LA Vol (A4C):   85.8 ml 36.01 ml/m LA Biplane Vol: 92.9 ml 38.99 ml/m  AORTIC VALVE                    PULMONIC VALVE AV Area (Vmax):    2.19 cm     PV Vmax:          1.12 m/s AV Area (Vmean):   2.28 cm     PV Peak grad:     5.0 mmHg AV Area (VTI):     2.15 cm     PR End Diast Vel: 7.40 msec AV Vmax:           96.50 cm/s AV Vmean:          59.100 cm/s AV VTI:            0.168 m AV Peak Grad:      3.7 mmHg AV Mean Grad:      2.0 mmHg LVOT Vmax:         67.30 cm/s LVOT Vmean:  42.800 cm/s LVOT VTI:          0.115 m LVOT/AV VTI ratio: 0.68 AI PHT:            337 msec  AORTA Ao Root diam: 3.10 cm Ao Asc diam:  3.30 cm MITRAL VALVE                TRICUSPID VALVE MV Area (PHT): 3.58 cm     TR Peak grad:   45.2 mmHg MV Decel Time: 212 msec     TR Mean grad:   26.0 mmHg MR Peak grad: 30.7 mmHg     TR Vmax:        336.00 cm/s MR Vmax:      277.00 cm/s   TR Vmean:       241.0 cm/s MV E velocity: 113.25 cm/s MV A velocity: 116.00 cm/s  SHUNTS MV E/A ratio:  0.98         Systemic VTI:  0.12 m                             Systemic Diam: 2.00 cm Jenkins Rouge MD Electronically signed by Jenkins Rouge MD Signature Date/Time: 07/27/2022/3:45:31 PM    Final    US Abdomen Limited RUQ (LIVER/GB)  Result Date: 07/26/2022 CLINICAL DATA:  Right upper quadrant abdominal pain. EXAM: ULTRASOUND ABDOMEN LIMITED RIGHT UPPER QUADRANT COMPARISON:  CT 07/26/2022, 07/24/2022, levin 323 FINDINGS: Gallbladder: Surgically absent. Common bile duct: Diameter: 4 mm. Liver: Technically limited evaluation due to habitus. Borderline increased hepatic echogenicity typical of steatosis. No focal hepatic abnormality. Portal vein is patent on color Doppler imaging with normal direction of blood flow towards the liver. Other: No right upper quadrant ascites. IMPRESSION: 1.  Post cholecystectomy without biliary dilatation. 2. Suggestion of mild hepatic steatosis.  No focal liver lesion. Electronically Signed   By: Keith Rake M.D.   On: 07/26/2022 22:19   CT ABDOMEN PELVIS W CONTRAST  Result Date: 07/26/2022 CLINICAL DATA:  Abdominal pain.  Left-sided chest pain. EXAM: CT ABDOMEN AND PELVIS WITH CONTRAST TECHNIQUE: Multidetector CT imaging of the abdomen and pelvis was performed using the standard protocol following bolus administration of intravenous contrast. RADIATION DOSE REDUCTION: This exam was performed according to the departmental dose-optimization program which includes automated exposure control, adjustment of the mA and/or kV according to patient size and/or use of iterative reconstruction technique. CONTRAST:  13m OMNIPAQUE IOHEXOL 300 MG/ML  SOLN COMPARISON:  CT renal stone 07/25/2019 FINDINGS: Lower chest: Bibasilar atelectasis. Cardiomegaly. Reflux of contrast into the hepatic veins. Hepatobiliary: Liver has a normal contour. No focal hepatic lesions are visualized. There is evidence of hepatic steatosis. Portal vein is contrast opacified. Gallbladder is surgically absent. Pancreas: No pancreatic ductal dilatation or surrounding inflammatory changes. Spleen: Normal in size without focal abnormality. Adrenals/Urinary Tract: Bilateral adrenal glands are normal in appearance without focal lesions. Bilateral kidneys are normal in size without evidence of hydronephrosis or definitive evidence of nephrolithiasis. There is a hypodense renal lesion projecting posteriorly from the interpolar left kidney, compatible with a renal cyst, requiring no further follow-up. Stomach/Bowel: The stomach, small bowel, large bowel is normal in caliber without evidence of bowel obstruction. There is diverticulosis without diverticulitis. The appendix is not definitively visualized. Assessment of the pelvis is limited due to streak artifact from bilateral hip arthroplasties.  Vascular/Lymphatic: Aortic atherosclerosis. There are a few prominent periaortic lymph nodes measuring up to 1.1 cm (series 3, image 31).  There is mixed calcified and noncalcified plaque at the origin of the SMA. Reproductive: Uterus and adnexa are poorly visualized and incompletely assessed. Other: There is trace fluid in the bilateral pericolic gutters, likely related to patient's fluid status. Musculoskeletal: Patient is status post bilateral hip arthroplasties as well as a kyphoplasty at L1. IMPRESSION: 1. No intra-abdominal finding to explain acute abdominal pain. Trace ascites is likely related to patient's fluid status. 2. Cardiomegaly with reflux of contrast into the hepatic veins, suggestive of cardiac dysfunction. 3. Hepatic steatosis. Aortic Atherosclerosis (ICD10-I70.0). Electronically Signed   By: Marin Roberts M.D.   On: 07/26/2022 12:50   DG Chest 2 View  Result Date: 07/26/2022 CLINICAL DATA:  Chest pain and shortness of breath. History of atrial fibrillation and congestive heart failure. EXAM: CHEST - 2 VIEW COMPARISON:  07/21/2022 FINDINGS: Examination is degraded due to patient body habitus. Grossly unchanged enlarged cardiac silhouette and mediastinal contours. There is persistent mild elevation/eventration of the right hemidiaphragm. No discrete focal airspace opacities. No pleural effusion or pneumothorax. No definite evidence of edema. No acute osseous abnormalities. IMPRESSION: Similar findings of cardiomegaly without superimposed acute cardiopulmonary disease on this body habitus degraded examination. Specifically, no definite evidence of edema. Electronically Signed   By: Sandi Mariscal M.D.   On: 07/26/2022 11:03   CT Renal Stone Study  Result Date: 07/24/2022 CLINICAL DATA:  Flank and back pain, abdominal pain, question RIGHT kidney stone EXAM: CT ABDOMEN AND PELVIS WITHOUT CONTRAST TECHNIQUE: Multidetector CT imaging of the abdomen and pelvis was performed following the standard  protocol without IV contrast. RADIATION DOSE REDUCTION: This exam was performed according to the departmental dose-optimization program which includes automated exposure control, adjustment of the mA and/or kV according to patient size and/or use of iterative reconstruction technique. COMPARISON:  07/21/2022 FINDINGS: Lower chest: Lung bases clear Hepatobiliary: Gallbladder surgically absent.  Liver unremarkable. Pancreas: Normal appearance Spleen: Normal appearance. Small splenule inferior to splenic hilum. Adrenals/Urinary Tract: Adrenal glands normal appearance. Exophytic cyst posterior LEFT kidney 3.1 x 2.7 cm image 35, simple features by noncontrast CT, unchanged; no follow-up imaging recommended. Kidneys, ureters, and bladder otherwise normal appearance Stomach/Bowel: Distal colonic diverticulosis without evidence of diverticulitis. Normal appendix. Ill-defined margin of the duodenum with borderline wall thickening and mild adjacent fluid in the RIGHT anterior pararenal space, question mild duodenitis. No extraluminal gas to suggest perforation. Stomach and remaining bowel loops unremarkable. Vascular/Lymphatic: Atherosclerotic calcifications aorta and iliac arteries as well as scattered visceral arteries. Aorta normal caliber. Upper normal sized LEFT para-aortic node 10 mm short axis image 32. Additional normal sized retroperitoneal nodes. Reproductive: Uterus surgically absent. Nonvisualization of ovaries. Other: Small amount of free fluid in pelvis. No free air. No definite hernia. Musculoskeletal: BILATERAL hip prostheses with beam hardening artifacts in pelvis. Osseous demineralization. Prior L1 compression fracture and vertebroplasty. IMPRESSION: Distal colonic diverticulosis without evidence of diverticulitis. Ill-defined margin of the duodenum with borderline wall thickening and mild adjacent fluid in the RIGHT anterior pararenal space, question mild duodenitis. No evidence of urinary tract calculus or  obstruction. Aortic Atherosclerosis (ICD10-I70.0). Electronically Signed   By: Lavonia Dana M.D.   On: 07/24/2022 16:40   CT L-SPINE NO CHARGE  Result Date: 07/21/2022 CLINICAL DATA:  Provided history: Level 1 trauma. Fall on blood thinners. EXAM: CT LUMBAR SPINE WITHOUT CONTRAST TECHNIQUE: Multidetector CT imaging of the lumbar spine was performed without intravenous contrast administration. Multiplanar CT image reconstructions were also generated. RADIATION DOSE REDUCTION: This exam was performed according to the departmental  dose-optimization program which includes automated exposure control, adjustment of the mA and/or kV according to patient size and/or use of iterative reconstruction technique. COMPARISON:  Lumbar spine radiographs 02/01/2021. FINDINGS: Segmentation: 5 lumbar vertebrae. The caudal most well-formed interval disc space is designated L5-S1. Alignment: 5 mm bony retropulsion at the level of the L1 superior endplate, similar the prior lumbar spine radiographs of 02/01/2021. Vertebrae: Chronic L1 vertebral compression fracture status post vertebral augmentation. No appreciable progressive height loss as compared to the prior lumbar spine radiographs of 02/01/2021. Subtle chronic T12 anterior wedge vertebral compression deformity, also unchanged from this prior exam. No evidence of acute fracture to the lumbar spine. Paraspinal and other soft tissues: No paraspinal hematoma, mass or collection. Please refer to the same day CT chest/abdomen/pelvis for a description of abdominopelvic soft tissue findings. Disc levels: Disc space narrowing is moderate at T12-L1 and mild-to-moderate at L1-L2. No more than mild disc space narrowing elsewhere within the lumbar spine. At T12-L1, bony retropulsion at the level of the L1 superior endplate mildly narrows the spinal canal. Within the lumbar spine, there are multilevel disc bulges. Facet arthrosis, greatest at L3-L4, L4-L5 and L5-S1. Minimal endplate spurring  at D3-T7. No appreciable high-grade spinal canal stenosis. Multilevel neural foraminal narrowing, greatest on the left at L5-S1 (moderate at this site). IMPRESSION: No evidence of acute fracture to the lumbar spine. Chronic L1 vertebral compression fracture status post vertebral augmentation. No appreciable progressive height loss as compared to the prior lumbar spine radiographs of 02/01/2021. Bony retropulsion at the level of the L1 superior endplate mildly narrows the spinal canal. Subtle chronic T12 anterior wedge vertebral compression deformity, also unchanged from the prior lumbar spine radiographs of 02/01/2021. Lumbar spondylosis, as described. Electronically Signed   By: Kellie Simmering D.O.   On: 07/21/2022 15:08   CT CHEST ABDOMEN PELVIS W CONTRAST  Result Date: 07/21/2022 CLINICAL DATA:  Provided history: Poly trauma, blunt. Additional history provided: Fall, on blood thinners. EXAM: CT CHEST, ABDOMEN, AND PELVIS WITH CONTRAST TECHNIQUE: Multidetector CT imaging of the chest, abdomen and pelvis was performed following the standard protocol during bolus administration of intravenous contrast. RADIATION DOSE REDUCTION: This exam was performed according to the departmental dose-optimization program which includes automated exposure control, adjustment of the mA and/or kV according to patient size and/or use of iterative reconstruction technique. CONTRAST:  75m OMNIPAQUE IOHEXOL 350 MG/ML SOLN COMPARISON:  CT angiogram chest 12/02/2020. CT chest/abdomen/pelvis 06/25/2018. Radiographs of the lumbar spine 02/01/2021. FINDINGS: CARDIOVASCULAR: Cardiomegaly. Calcified atherosclerotic plaque within the coronary arteries. Scattered atherosclerotic plaque within the thoracic aorta. No evidence of acute traumatic vascular injury. MEDIASTINUM/NODES: No mediastinal hematoma.No lymphadenopathy. 3 mm nodule within the right thyroid lobe, not meeting consensus criteria for ultrasound follow-up based on size. No  follow-up imaging is recommended. LUNGS/PLEURA: No airspace consolidation. No pleural fluid or pneumothorax. The central airways are grossly patent. MUSCULOSKELETAL: No acute fracture is identified. Multilevel bridging ventrolateral osteophytes within the thoracic spine with relative preservation of the intervertebral disc spaces. 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 HEPATOBILIARY: Hepatic steatosis. No focal hepatic lesion is identified. Prior cholecystectomy.No intra or extrahepatic biliary ductal dilatation. PANCREAS: No focal lesion.No ductal dilatation or peripancreatic fluid. SPLEEN:Normal in size without focal lesion. Incidentally noted splenule at the splenic hilum. ADRENALS/URINARY TRACT: No adrenal gland hemorrhage or mass. The kidneys are normal in size. Small focus of cortical scarring within the posterior aspect of the interpolar right kidney. Redemonstrated 2.6 cm simple fluid attenuation cyst arising from the posterior aspect  of the interpolar left kidney. No follow-up imaging is recommended. No evidence of acute traumatic renal injury. No hydronephrosis or renal calculus. Unremarkable appearance of the ureters. Unremarkable appearance of the bladder given the degree of distension. STOMACH/BOWEL: The stomach is unremarkable. No evidence of acute traumatic bowel injury. No bowel wall thickening, bowel dilatation or evidence of inflammation. Sigmoid diverticulosis without CT evidence of diverticulitis. VASCULAR/LYMPHATIC: Streak and beam hardening artifact arising from bilateral hip prostheses limits evaluation of the pelvic contents. Within this limitation, there is no appreciable a abdominopelvic lymphadenopathy. No evidence of acute traumatic injury to the abdominal aorta. Atherosclerotic plaque within the abdominal aorta and bilateral iliac arteries. REPRODUCTIVE: Not well evaluated due to streak and beam hardening artifact arising from bilateral hip prostheses. OTHER:  No appreciable abdominopelvic ascites or mesenteric hematoma. MUSCULOSKELETAL: Prominent streak and beam hardening artifact arising from bilateral hip prostheses, partially obscuring regional osseous structures. No evidence of acute fracture Chronic L1 vertebral compression fracture status post vertebral augmentation. No progressive height loss as compared to the prior lumbar spine radiographs of 02/01/2021. Please refer to the separately reported lumbar spine CT for additional lumbar spine findings. IMPRESSION: 1. Streak and beam hardening artifact arising from bilateral hip prostheses partially obscures regional osseous structures, and obscures portions of the pelvic contents. 2. Within this limitation, there is no evidence of acute traumatic injury to the chest, abdomen or pelvis. 3. Cardiomegaly. 4. Coronary artery atherosclerosis. 5.  Aortic Atherosclerosis (ICD10-I70.0). 6. Hepatic steatosis. 7. Redemonstrated small focus of cortical scarring within the posterior aspect of the interpolar right kidney. 8. Diverticulosis without CT evidence of diverticulitis. 9. Chronic L1 vertebral compression fracture status post vertebral augmentation. No progressive height loss as compared to the prior lumbar spine radiographs of 02/01/2021. 10. 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 Electronically Signed   By: Kellie Simmering D.O.   On: 07/21/2022 14:54   CT HEAD WO CONTRAST (5MM)  Result Date: 07/21/2022 CLINICAL DATA:  Provided history: Head trauma, minor. Polytrauma, blunt. Additional history provided: Fall. EXAM: CT HEAD WITHOUT CONTRAST CT CERVICAL SPINE WITHOUT CONTRAST TECHNIQUE: Multidetector CT imaging of the head and cervical spine was performed following the standard protocol without intravenous contrast. Multiplanar CT image reconstructions of the cervical spine were also generated. RADIATION DOSE REDUCTION: This exam was performed according to the departmental dose-optimization  program which includes automated exposure control, adjustment of the mA and/or kV according to patient size and/or use of iterative reconstruction technique. COMPARISON:  Head CT 04/26/2021. Cervical spine CT 04/26/2021. FINDINGS: CT HEAD FINDINGS Brain: No age advanced or lobar predominant parenchymal atrophy. Partially empty sella turcica. There is no acute intracranial hemorrhage. No demarcated cortical infarct. No extra-axial fluid collection. No evidence of an intracranial mass. No midline shift. Vascular: No hyperdense vessel. Atherosclerotic calcifications. Skull: No fracture or aggressive osseous lesion. Sinuses/Orbits: No mass or acute finding within the imaged orbits. No significant paranasal sinus disease. CT CERVICAL SPINE FINDINGS Alignment: Straightening of the expected cervical lordosis. No significant spondylolisthesis. Skull base and vertebrae: The basion-dental and atlanto-dental intervals are maintained.No evidence of acute fracture to the cervical spine. Soft tissues and spinal canal: No prevertebral fluid or swelling. No visible canal hematoma. Disc levels: Cervical spondylosis with multilevel disc space narrowing, disc bulges/central disc protrusions, posterior disc osteophyte complexes, endplate spurring and uncovertebral hypertrophy. Disc space narrowing is greatest at C4-C5, C5-C6 and C6-C7 (moderate to advanced at these levels). No appreciable high-grade spinal canal stenosis. Multilevel bony neural foraminal narrowing. Upper chest: No consolidation  within the imaged lung apices. No visible pneumothorax. IMPRESSION: CT head: No evidence of acute intracranial abnormality. CT cervical spine: 1. No evidence of acute fracture to the cervical spine. 2. Nonspecific straightening of the expected cervical lordosis. 3. Cervical spondylosis, as described. Electronically Signed   By: Kellie Simmering D.O.   On: 07/21/2022 14:18   CT Cervical Spine Wo Contrast  Result Date: 07/21/2022 CLINICAL DATA:   Provided history: Head trauma, minor. Polytrauma, blunt. Additional history provided: Fall. EXAM: CT HEAD WITHOUT CONTRAST CT CERVICAL SPINE WITHOUT CONTRAST TECHNIQUE: Multidetector CT imaging of the head and cervical spine was performed following the standard protocol without intravenous contrast. Multiplanar CT image reconstructions of the cervical spine were also generated. RADIATION DOSE REDUCTION: This exam was performed according to the departmental dose-optimization program which includes automated exposure control, adjustment of the mA and/or kV according to patient size and/or use of iterative reconstruction technique. COMPARISON:  Head CT 04/26/2021. Cervical spine CT 04/26/2021. FINDINGS: CT HEAD FINDINGS Brain: No age advanced or lobar predominant parenchymal atrophy. Partially empty sella turcica. There is no acute intracranial hemorrhage. No demarcated cortical infarct. No extra-axial fluid collection. No evidence of an intracranial mass. No midline shift. Vascular: No hyperdense vessel. Atherosclerotic calcifications. Skull: No fracture or aggressive osseous lesion. Sinuses/Orbits: No mass or acute finding within the imaged orbits. No significant paranasal sinus disease. CT CERVICAL SPINE FINDINGS Alignment: Straightening of the expected cervical lordosis. No significant spondylolisthesis. Skull base and vertebrae: The basion-dental and atlanto-dental intervals are maintained.No evidence of acute fracture to the cervical spine. Soft tissues and spinal canal: No prevertebral fluid or swelling. No visible canal hematoma. Disc levels: Cervical spondylosis with multilevel disc space narrowing, disc bulges/central disc protrusions, posterior disc osteophyte complexes, endplate spurring and uncovertebral hypertrophy. Disc space narrowing is greatest at C4-C5, C5-C6 and C6-C7 (moderate to advanced at these levels). No appreciable high-grade spinal canal stenosis. Multilevel bony neural foraminal narrowing.  Upper chest: No consolidation within the imaged lung apices. No visible pneumothorax. IMPRESSION: CT head: No evidence of acute intracranial abnormality. CT cervical spine: 1. No evidence of acute fracture to the cervical spine. 2. Nonspecific straightening of the expected cervical lordosis. 3. Cervical spondylosis, as described. Electronically Signed   By: Kellie Simmering D.O.   On: 07/21/2022 14:18   DG Pelvis Portable  Result Date: 07/21/2022 CLINICAL DATA:  Fall. EXAM: PORTABLE PELVIS 1-2 VIEWS COMPARISON:  April 26, 2021. FINDINGS: There is no evidence of pelvic fracture or diastasis. No pelvic bone lesions are seen. Status post bilateral total hip arthroplasties. IMPRESSION: No acute abnormality is noted. Electronically Signed   By: Marijo Conception M.D.   On: 07/21/2022 13:48   DG Chest Portable 1 View  Result Date: 07/21/2022 CLINICAL DATA:  Female presents following fall. EXAM: PORTABLE CHEST 1 VIEW COMPARISON:  September 06, 2021. FINDINGS: EKG leads project over the chest. Trachea midline. Cardiomediastinal contours and hilar structures with signs of moderate cardiac enlargement similar to prior imaging accounting for AP projection. Lungs are clear.  No sign of pneumothorax. On limited assessment no acute skeletal process. IMPRESSION: Moderate cardiac enlargement. No acute cardiopulmonary disease. Electronically Signed   By: Zetta Bills M.D.   On: 07/21/2022 13:47   VAS [name] LOWER EXTREMITY VENOUS (DVT)  Result Date: 07/04/2022  Lower Venous DVT Study Patient Name:  JAMIESON LISA  Date of Exam:   07/03/2022 Medical Rec #: [id number]           Accession #:  [id number] Date of Birth: April 14, 1951           Patient Gender: F Patient Age:   16 years Exam Location:  [facility] Procedure:      VAS [name] LOWER EXTREMITY VENOUS (DVT) Referring Phys: Nuala Alpha --------------------------------------------------------------------------------  Indications: Acute onset of pain in the left foot that has  now radiated to the lower leg x 5 days. Patient reports some mild SOB.  Risk Factors: None identified obesity. Anticoagulation: Eliquis. Comparison Study: NA Performing Technologist: Sharlett Iles RVT  Examination Guidelines: A complete evaluation includes B-mode imaging, spectral Doppler, color Doppler, and power Doppler as needed of all accessible portions of each vessel. Bilateral testing is considered an integral part of a complete examination. Limited examinations for reoccurring indications may be performed as noted. The reflux portion of the exam is performed with the patient in reverse Trendelenburg.  +-----+---------------+---------+-----------+----------+--------------+ RIGHTCompressibilityPhasicitySpontaneityPropertiesThrombus Aging +-----+---------------+---------+-----------+----------+--------------+ CFV  Full                    Yes                                 +-----+---------------+---------+-----------+----------+--------------+   Right Technical Findings: Pulsatile venous flow noted in the CFV.  +---------+---------------+---------+-----------+----------+--------------+ LEFT     CompressibilityPhasicitySpontaneityPropertiesThrombus Aging +---------+---------------+---------+-----------+----------+--------------+ CFV      Full                    Yes                                 +---------+---------------+---------+-----------+----------+--------------+ SFJ      Full                    Yes                                 +---------+---------------+---------+-----------+----------+--------------+ FV Prox  Full                    Yes                                 +---------+---------------+---------+-----------+----------+--------------+ FV Mid   Full                                                        +---------+---------------+---------+-----------+----------+--------------+ FV DistalFull                    Yes                                  +---------+---------------+---------+-----------+----------+--------------+ PFV      Full                    Yes                                 +---------+---------------+---------+-----------+----------+--------------+ POP      Full  Yes                                 +---------+---------------+---------+-----------+----------+--------------+ PTV      Full                                                        +---------+---------------+---------+-----------+----------+--------------+ PERO     Full                                                        +---------+---------------+---------+-----------+----------+--------------+ Gastroc  Full                                                        +---------+---------------+---------+-----------+----------+--------------+ GSV      Full                    Yes                                 +---------+---------------+---------+-----------+----------+--------------+   Left Technical Findings: Pulsatile venous flow throughout the lower extremity.   Summary: RIGHT: - No evidence of common femoral vein obstruction.  LEFT: - No evidence of deep vein thrombosis in the lower extremity. No indirect evidence of obstruction proximal to the inguinal ligament. - No cystic structure found in the popliteal fossa.  - Pulsatile lower limb venous Doppler waveforms are noted which correlates well with increase right atrium pressure; right-sided heart failure.  *See table(s) above for measurements and observations. Electronically signed by Carlyle Dolly MD on 07/04/2022 at 9:59:33 AM.    Final     Microbiology: Results for orders placed or performed during the hospital encounter of 07/24/22  Urine Culture     Status: Abnormal   Collection Time: 07/24/22  1:37 PM   Specimen: Urine, Clean Catch  Result Value Ref Range Status   Specimen Description   Final    URINE, CLEAN CATCH Performed at [facility], [address] [place], [place] [address]    Special Requests   Final    NONE Performed at [facility], [address], [place], [place] [address]    Culture (A)  Final    20,000 COLONIES/mL MULTIPLE SPECIES PRESENT, SUGGEST RECOLLECTION   Report Status 07/25/2022 FINAL  Final    Labs: CBC: Recent Labs  Lab 07/24/22 1337 07/26/22 1025 07/28/22 0339 07/29/22 0448  WBC 9.3 6.3 6.0 5.6  NEUTROABS  --   --  3.5 3.4  HGB 12.6 12.0 12.4 12.6  HCT 40.9 38.5 40.3 40.7  MCV 96.5 95.3 95.5 95.3  PLT 213 201 212 283   Basic Metabolic Panel: Recent Labs  Lab 07/26/22 1025 07/27/22 0347 07/28/22 0339 07/29/22 0448 07/30/22 0407  NA 137 142 139 141 139  K 3.8 4.1 3.1* 3.5 3.4*  CL 97* 99 97* 98 98  CO2 30 33*  30 35* 29  GLUCOSE 108* 114* 117* 101* 104*  BUN 32* 28* '19 14 14  '$ CREATININE 1.38* 1.24* 1.05* 1.01* 0.96  CALCIUM 9.0 8.9 8.7* 8.7* 8.8*  MG  --   --  1.9  --   --    Liver Function Tests: Recent Labs  Lab 07/24/22 1337 07/26/22 1025  AST 20 18  ALT 14 15  ALKPHOS 77 71  BILITOT 1.2 1.0  PROT 6.8 6.7  ALBUMIN 4.2 4.1   CBG: Recent Labs  Lab 07/27/22 1136 07/27/22 1626  GLUCAP 93 74    Discharge time spent: greater than 30 minutes.  Signed: Alma Friendly, MD [facility] 07/30/2022

## 2022-07-31 ENCOUNTER — Inpatient Hospital Stay: Payer: Medicare Other | Admitting: Internal Medicine

## 2022-07-31 ENCOUNTER — Telehealth: Payer: Self-pay

## 2022-07-31 NOTE — Telephone Encounter (Signed)
Transition Care Management Follow-up Telephone Call Date of discharge and from where: [facility] 07-30-22 Dx: Acute on chronic diastolic CHF  How have you been since you were released from the hospital? Doing ok  Any questions or concerns? No  Items Reviewed: Did the pt receive and understand the discharge instructions provided? Yes  Medications obtained and verified? Yes  Other? No  Any new allergies since your discharge? No  Dietary orders reviewed? Yes Do you have support at home? Yes   Home Care and Equipment/Supplies: Were home health services ordered? Yes PT If so, what is the name of the agency? Unsure of name   Has the agency set up a time to come to the patient's home? no Were any new equipment or medical supplies ordered?  No What is the name of the medical supply agency? na Were you able to get the supplies/equipment? not applicable Do you have any questions related to the use of the equipment or supplies? No  Functional Questionnaire: (I = Independent and D = Dependent) ADLs: I  Bathing/Dressing- I  Meal Prep- I  Eating- I  Maintaining continence- I  Transferring/Ambulation- I-WALKER  Managing Meds- I  Follow up appointments reviewed:  PCP Hospital f/u appt confirmed? Yes  Scheduled to see Dr Deniece Ree on 08-03-22 @ [facility] f/u appt confirmed? No  pt is calling Cardiologist for fu appt . Are transportation arrangements needed? No  If their condition worsens, is the pt aware to call PCP or go to the Emergency Dept.? Yes Was the patient provided with contact information for the PCP's office or ED? Yes Was to pt encouraged to call back with questions or concerns? Yes   Juanda Crumble LPN [facility] Direct Dial [phone]

## 2022-08-02 ENCOUNTER — Ambulatory Visit: Payer: Medicare Other | Attending: Cardiovascular Disease | Admitting: Cardiovascular Disease

## 2022-08-02 ENCOUNTER — Encounter: Payer: Self-pay | Admitting: Cardiovascular Disease

## 2022-08-02 VITALS — BP 105/65 | HR 80 | Ht 66.5 in | Wt 292.6 lb

## 2022-08-02 DIAGNOSIS — I5032 Chronic diastolic (congestive) heart failure: Secondary | ICD-10-CM | POA: Diagnosis not present

## 2022-08-02 DIAGNOSIS — I5033 Acute on chronic diastolic (congestive) heart failure: Secondary | ICD-10-CM | POA: Diagnosis not present

## 2022-08-02 NOTE — Progress Notes (Signed)
Cardiology Office Note:    Date:  08/02/2022   ID:  Kathy [name], DOB 04/19/1951, MRN [id number]  PCP:  Isaac Bliss, Rayford Halsted, MD  Cardiologist:  Mertie Moores, MD  Electrophysiologist:  None   Referring MD: Isaac Bliss, [place]*   Problem List 1.  Chronic diastolic CHF 2. Morbid obesity 3. Severe pulmonary HTN 4. Mild CAD  5.   Chief Complaint  Patient presents with   Congestive Heart Failure        Obesity   Atrial Fibrillation             Kathy [name] is a 71 y.o. female with a hx of  MVP, chest pain  Hyperlipidemia and atrial fib We are asked to see her by Isaac Bliss, Rayford Halsted, MD for further eval of her atrial fib  I have seen her many years ago .   Developed CP,  Called EMS ECG at home showed atrial fib  She was admitted to the hospital with mid sternal chest pain , atrial fib, HTN.  Troponin levels rose to 4.96.    cath showed mild irreg. CT angiogram of the chest showed no aortic dissection  Echo shows  Normal LV systolic function - severe concnetric LCH Grade 2 diastolic dysfunction   Severe pulmonary HTN with estimated PA pressure of 84.   Gained 16 lbs after her hospitalization Was started on Lasix by Dr. Sharlene Motts  BP is now under control.  Breathing seems to be better Does not do any exercise ( limited by orthopedic issues )    Feb. 25, 2020: Kathy [name] is seen back today for follow-up of her chronic diastolic congestive heart failure, paroxysmal atrial fibrillation and pulmonary hypertension.  Takes her BP and HR daily.   Is in atrial flutter today   Has been more short of breath for the past several days . Has started working part time.   Has had her 1st part of her sleep study .   Needs to go back for her CPAP titration  Is working on weight loss.  Still fluctuating .   Sept, 29, 2020 Kathy [name] is seen for follow up of her chronic diastolic CHF, severe pulmonary HTN, morbid obesity, Wt today is 295  ( up 9 lbs from  last visit 6 months )  Has a bump  on the back of her head   Breathing has been ok  Has not been exercising . And is drinking coke.    Is under some stress.   Husband has throat cancer.   06/21/2021[name] seen today for follow-up visit. She has a history of chronic diastolic CHF, severe pulmonary hypertension, morbid obesity and paroxysmal atrial fibrillation.  she was admitted with Atrial fib while in [place]  She got up to get some water and passed out on the way to the fridge. She felt like her HR was irreg.    EMS was called ,  Took her to the hospitial.  She was found to have atrial fib .  She converted on her own .  Did not reguire cardioversion.    Was Started on Diltiazem  Was started on Diltiazem and lasix was increased to 40 mg a day .  Is losing weight - 12 lbs since previoius office visit .   Jan. 25, 2022: Kathy [name] is seen today for follow up of her chronic diastolic CHF, severe pulmonary HTN, morbid obesity, and PAF  Wt today is 282 lbs  Still has episodes of PAF. -  might last for a few minutes [place] and broke her back  - had kyphoplasty.  She is still recovering  She will need another kyphoplasty in 6 weeks  Has some occasional chest twinges above her left breast  Has not been exercisng   Sept.  18, 2023  Kathy [name] is seen for follow up of her diastolic CHF , obesity, HTN, PAF  Started having hot flashes , sweats last   week  Generalized weakness.  Has some nausea  Has post nasal drip ,  No COVID symptoms  No pleuri  The sensation of feeling hot comes and goes.  TSH in Jan. 2023 was normal  HbA1C was 5.8 in Jan, 2023  Has not tested for covid .   No blood in stool.   Nov. 15, 2023 Kathy [name] is seen for follow up of her morbid obesity, atrial fibrillation, HTN She was admitted to the hospital on November 8 with shortness of breath.  She has a history of chronic diastolic congestive heart failure. She was found to have severe right ventricular systolic dysfunction.  She has  a history of obstructive sleep apnea and reports noncompliance with her CPAP following the death of her husband.  She was started on Entresto.  Wt today is 292 lbs   Has some diarrhea with the entresto  Is not urinating as much now  Feels a bit weak .   BP is on the low side, Will DC diltiazem   Will refer to the advanced CHF team    Past Medical History:  Diagnosis Date   Adenomatous colon polyp    2012   Anxiety and depression    Arthritis    Osteoarthritis, Dr Maureen Ralphs   Cancer [facility])    skin - left foot excised   Chronic diastolic CHF (congestive heart failure) ([place])    DDD (degenerative disc disease), cervical    Deaf, right    DVT (deep venous thrombosis) ([place])    RLE DVT 08/2004, post right knee arthroscopy   Fibromyalgia    Dr Tobie Lords, Rheu   GERD (gastroesophageal reflux disease)    H/O hiatal hernia    Headache(784.0)    PAST HX MILD MIGRAINES - resolved per patient 08/18/20   History of kidney stones    passed stones   HTN (hypertension)    IBS (irritable bowel syndrome)    Internal hemorrhoid    Lumbar radiculopathy    Obesity    OSA on CPAP    Moderate with AHI 20/hr with nocturnal hypoxemia on CPAP   PAF (paroxysmal atrial fibrillation) ([place]) 06/2018   Pulmonary hypertension ([place])     Past Surgical History:  Procedure Laterality Date   ABDOMINAL HYSTERECTOMY     dysfunctional menses   APPENDECTOMY     BREAST EXCISIONAL BIOPSY Right    CARDIAC CATHETERIZATION  06/26/2018   CHOLECYSTECTOMY     COLONOSCOPY  05/27/2002, 08/08/11   2003 diverticulosis, hemorrhoids 2012 same + cecal polyp   EYE SURGERY     KNEE ARTHROSCOPY  2008   right   KYPHOPLASTY Bilateral 08/19/2020   Procedure: KYPHOPLASTY LUMBAR ONE;  Surgeon: Vallarie Mare, MD;  Location: [place];  Service: Neurosurgery;  Laterality: Bilateral;  posterior   LEFT HEART CATH AND CORONARY ANGIOGRAPHY N/A 06/26/2018   Procedure: LEFT HEART CATH AND CORONARY ANGIOGRAPHY;  Surgeon: Wellington Hampshire, MD;  Location: [place] [facility];  Service: Cardiovascular;  Laterality: N/A;   MELANOMA EXCISION Left 07/2013   foot  TOTAL HIP ARTHROPLASTY  02/2010   left ; Dr Maureen Ralphs   TOTAL HIP ARTHROPLASTY  03/18/2012   Procedure: TOTAL HIP ARTHROPLASTY;  Surgeon: Gearlean Alf, MD;  Location: [facility];  Service: Orthopedics;  Laterality: Right;   UPPER GASTROINTESTINAL ENDOSCOPY  04/21/2009, 08/08/11   2010 gastritis ;2012 small hiatal hernia. Dr Carlean Purl   WRIST FRACTURE SURGERY Left     Current Medications: Current Meds  Medication Sig   ALPRAZolam (XANAX) 1 MG tablet TAKE 1 TABLET BY MOUTH THREE TIMES A DAY AS NEEDED FOR ANXIETY   apixaban (ELIQUIS) 5 MG TABS tablet Take 1 tablet (5 mg total) by mouth 2 (two) times daily.   atorvastatin (LIPITOR) 40 MG tablet Take 1 tablet (40 mg total) by mouth daily.   Calcium Carbonate (CALCIUM 600 PO) Take 1 tablet by mouth daily.   clindamycin (CLEOCIN T) 1 % lotion Apply 1 application topically 2 (two) times daily as needed (rosacea (skin irritation)).    clobetasol (TEMOVATE) 0.05 % external solution Apply 1 application topically daily as needed (rosacea).   cyclobenzaprine (FLEXERIL) 5 MG tablet Take 5 mg by mouth at bedtime as needed for muscle spasms.   fluocinonide (LIDEX) 0.05 % external solution Apply 1 application topically daily as needed (rosacea).   FLUoxetine (PROZAC) 40 MG capsule TAKE 1 CAPSULE BY MOUTH EVERY DAY   furosemide (LASIX) 40 MG tablet Take 1 tablet (40 mg total) by mouth daily.   gabapentin (NEURONTIN) 300 MG capsule Take 300 mg by mouth at bedtime. TAKE 1 CAPSULE BY MOUTH EVERY DAY AT BEDTIME FOR 30 DAYS   HYDROcodone-acetaminophen (NORCO/VICODIN) 5-325 MG tablet Take 1 tablet by mouth every 6 (six) hours as needed for moderate pain.   methocarbamol (ROBAXIN) 500 MG tablet Take 500 mg by mouth every 8 (eight) hours as needed.   metoprolol succinate (TOPROL-XL) 100 MG 24 hr tablet TAKE 1 TABLET BY MOUTH EVERY DAY    metroNIDAZOLE (METROCREAM) 0.75 % cream Apply 1 application topically 2 (two) times daily as needed (rosacea).    Multiple Vitamin (MULTIVITAMIN WITH MINERALS) TABS tablet Take 1 tablet by mouth daily. Centrum Silver   omeprazole (PRILOSEC) 20 MG capsule Take 2 capsules (40 mg total) by mouth daily.   potassium chloride (KLOR-CON) 10 MEQ tablet Take 1 tablet (10 mEq total) by mouth daily.   sacubitril-valsartan (ENTRESTO) 24-26 MG Take 1 tablet by mouth 2 (two) times daily.   [DISCONTINUED] diltiazem (CARDIZEM CD) 120 MG 24 hr capsule Take 1 capsule (120 mg total) by mouth daily.     Allergies:   Achromycin [tetracycline hcl], Penicillins, Sulfamethoxazole-trimethoprim, Nickel, and Zanaflex [tizanidine hydrochloride]   Social History   Socioeconomic History   Marital status: Widowed    Spouse name: Not on file   Number of children: 2   Years of education: Not on file   Highest education level: Not on file  Occupational History   Occupation: retired  Tobacco Use   Smoking status: Never   Smokeless tobacco: Never  Vaping Use   Vaping Use: Never used  Substance and Sexual Activity   Alcohol use: Yes    Comment: occasional   Drug use: No   Sexual activity: Not on file    Comment: Hysterectomy  Other Topics Concern   Not on file  Social History Narrative   0 caffeine drinks    Social Determinants of Health   Financial Resource Strain: Not on file  Food Insecurity: No Food Insecurity (07/26/2022)   Hunger Vital Sign  Worried About Charity fundraiser in the Last Year: Never true    [place] in the Last Year: Never true  Transportation Needs: No Transportation Needs (07/26/2022)   PRAPARE - Hydrologist (Medical): No    Lack of Transportation (Non-Medical): No  Physical Activity: Not on file  Stress: Not on file  Social Connections: Not on file     Family History: The patient's family history includes Breast cancer in her paternal aunt;  Breast cancer (age of onset: 90) in her mother; COPD in her maternal grandmother and mother; Diabetes in her father and paternal grandmother; Heart attack (age of onset: 57) in her brother; Heart attack (age of onset: 31) in her mother; Heart disease in her paternal grandmother; Heart failure (age of onset: 82) in her father; Hypertension in her father; Stroke in her paternal grandfather. There is no history of Colon cancer.  ROS:   Please see the history of present illness.     All other systems reviewed and are negative.  EKGs/Labs/Other Studies Reviewed:    The following studies were reviewed today:    Recent Labs: 07/22/2022: TSH 1.749 07/26/2022: ALT 15; B Natriuretic Peptide 271.1 07/28/2022: Magnesium 1.9 07/29/2022: Hemoglobin 12.6; Platelets 206 07/30/2022: BUN 14; Creatinine, Ser 0.96; Potassium 3.4; Sodium 139  Recent Lipid Panel    Component Value Date/Time   CHOL 142 10/12/2021 0924   TRIG 98.0 10/12/2021 0924   HDL 51.00 10/12/2021 0924   CHOLHDL 3 10/12/2021 0924   VLDL 19.6 10/12/2021 0924   LDLCALC 71 10/12/2021 0924   LDLDIRECT 143.2 01/22/2013 0856    Physical Exam:  Physical Exam: Blood pressure 105/65, pulse 80, height 5' 6.5" (1.689 m), weight 292 lb 9.6 oz (132.7 kg), SpO2 96 %.       GEN:  middle age female,   morbid obesity ,  in no acute distress HEENT: Normal NECK: No JVD; No carotid bruits LYMPHATICS: No lymphadenopathy CARDIAC: RRR , mo murmurs, no gallop  RESPIRATORY:  Clear to auscultation without rales, wheezing or rhonchi  ABDOMEN: Soft, non-tender, non-distended MUSCULOSKELETAL:  No edema; No deformity  SKIN: Warm and dry NEUROLOGIC:  Alert and oriented x 3       EKG:  ASSESSMENT:    1. Chronic diastolic CHF (congestive heart failure) ([name])   2. Acute on chronic diastolic (congestive) heart failure (H[facility])      PLAN:        Chronic diastolic congestive heart failure:     has been started on Entresto.   BP is a bit low.   Will DC diltiazme Cont lasix for now although she may need to reduce the dose if she continues to be hypotensive   2.  Pulmonary hypertension:    I will refer her to the Advanced CHF team for follow up   3.  Paroxysmal atrial fibrillation:    remains in NSR today    4.  Coronary artery disease:   5.  Morbid obesity :      Advised weight loss  6.  Generalized weakness:    I will plan on seeing her in 3 months unless she is transitioned to the advanced heart failure team.  If she continues to follow-up with advanced heart failure then I will see her on an as-needed basis.    Medication Adjustments/Labs and Tests Ordered: Current medicines are reviewed at length with the patient today.  Concerns regarding medicines are outlined above.  Orders  Placed This Encounter  Procedures   AMB referral to CHF clinic   No orders of the defined types were placed in this encounter.    Patient Instructions  Medication Instructions:  STOP DILITIAZEM *If you need a refill on your cardiac medications before your next appointment, please call your pharmacy*   Lab Work: NONE If you have labs (blood work) drawn today and your tests are completely normal, you will receive your results only by: [place] (if you have MyChart) OR A paper copy in the mail If you have any lab test that is abnormal or we need to change your treatment, we will call you to review the results.   Testing/Procedures: NONE   Follow-Up: At [facility], you and your health needs are our priority.  As part of our continuing mission to provide you with exceptional heart care, we have created designated Provider Care Teams.  These Care Teams include your primary Cardiologist (physician) and Advanced Practice Providers (APPs -  Physician Assistants and Nurse Practitioners) who all work together to provide you with the care you need, when you need it.  We recommend signing up for the patient portal called  "MyChart".  Sign up information is provided on this After Visit Summary.  MyChart is used to connect with patients for Virtual Visits (Telemedicine).  Patients are able to view lab/test results, encounter notes, upcoming appointments, etc.  Non-urgent messages can be sent to your provider as well.   To learn more about what you can do with MyChart, go to [email or web address].    Your next appointment:   3 month(s)  The format for your next appointment:   In Person  Provider:   Mertie Moores, MD    Other Instructions [place] [facility] [employer]         Signed, Mertie Moores, MD  08/02/2022 10:39 AM    [place]

## 2022-08-02 NOTE — Patient Instructions (Signed)
Medication Instructions:  STOP DILITIAZEM *If you need a refill on your cardiac medications before your next appointment, please call your pharmacy*   Lab Work: NONE If you have labs (blood work) drawn today and your tests are completely normal, you will receive your results only by: [place] (if you have MyChart) OR A paper copy in the mail If you have any lab test that is abnormal or we need to change your treatment, we will call you to review the results.   Testing/Procedures: NONE   Follow-Up: At [facility], you and your health needs are our priority.  As part of our continuing mission to provide you with exceptional heart care, we have created designated Provider Care Teams.  These Care Teams include your primary Cardiologist (physician) and Advanced Practice Providers (APPs -  Physician Assistants and Nurse Practitioners) who all work together to provide you with the care you need, when you need it.  We recommend signing up for the patient portal called "MyChart".  Sign up information is provided on this After Visit Summary.  MyChart is used to connect with patients for Virtual Visits (Telemedicine).  Patients are able to view lab/test results, encounter notes, upcoming appointments, etc.  Non-urgent messages can be sent to your provider as well.   To learn more about what you can do with MyChart, go to [email or web address].    Your next appointment:   3 month(s)  The format for your next appointment:   In Person  Provider:   Mertie Moores, MD    Other Instructions [place]

## 2022-08-03 ENCOUNTER — Encounter: Payer: Self-pay | Admitting: Internal Medicine

## 2022-08-03 ENCOUNTER — Ambulatory Visit: Payer: Medicare Other | Admitting: Internal Medicine

## 2022-08-03 VITALS — BP 124/84 | HR 77 | Temp 97.8°F | Wt 291.2 lb

## 2022-08-03 DIAGNOSIS — N1831 Chronic kidney disease, stage 3a: Secondary | ICD-10-CM

## 2022-08-03 DIAGNOSIS — Z09 Encounter for follow-up examination after completed treatment for conditions other than malignant neoplasm: Secondary | ICD-10-CM | POA: Diagnosis not present

## 2022-08-03 DIAGNOSIS — I5032 Chronic diastolic (congestive) heart failure: Secondary | ICD-10-CM

## 2022-08-03 DIAGNOSIS — G4733 Obstructive sleep apnea (adult) (pediatric): Secondary | ICD-10-CM

## 2022-08-03 NOTE — Progress Notes (Signed)
Hospital follow-up visit     CC/Reason for Visit: Hospitalization follow-up  HPI: Kathy [name] is a 71 y.o. female who is coming in today for the above mentioned reasons, specifically transitional care services face-to-face visit.    Dates hospitalized: 07/26/2022-07/30/2022 Days since discharge from hospital: 4 Patient was discharged from the hospital to: Home Reason for admission to hospital: Acute on chronic heart failure Date of interactive phone contact with patient and/or caregiver: 07/31/2022  I have reviewed in detail patient's discharge summary plus pertinent specific notes, labs, and images from the hospitalization.  Yes  Patient was admitted and found to be in acute heart failure.  Echo showed severe RV dysfunction.  She was diuresed.  Kidney function was stable, she is known to have stage III chronic kidney disease.  She has already followed up with cardiology.  She was started on Entresto.  Subsequently taken off diltiazem due to low blood pressure.  It is thought that her OSA and her being noncompliant with CPAP was playing a large role.  She has been compliant with CPAP since discharge.  She states she is sleeping much better.  She has been referred to the heart failure team.  Medication reconciliation was done today and patient is taking meds as recommended by discharging hospitalist/specialist.  Yes   Past Medical/Surgical History: Past Medical History:  Diagnosis Date   Adenomatous colon polyp    2012   Anxiety and depression    Arthritis    Osteoarthritis, Dr Maureen Ralphs   Cancer [facility])    skin - left foot excised   Chronic diastolic CHF (congestive heart failure) ([place])    DDD (degenerative disc disease), cervical    Deaf, right    DVT (deep venous thrombosis) ([place])    RLE DVT 08/2004, post right knee arthroscopy   Fibromyalgia    Dr Tobie Lords, Rheu   GERD (gastroesophageal reflux disease)    H/O hiatal hernia    Headache(784.0)    PAST HX MILD  MIGRAINES - resolved per patient 08/18/20   History of kidney stones    passed stones   HTN (hypertension)    IBS (irritable bowel syndrome)    Internal hemorrhoid    Lumbar radiculopathy    Obesity    OSA on CPAP    Moderate with AHI 20/hr with nocturnal hypoxemia on CPAP   PAF (paroxysmal atrial fibrillation) ([place]) 06/2018   Pulmonary hypertension ([place])     Past Surgical History:  Procedure Laterality Date   ABDOMINAL HYSTERECTOMY     dysfunctional menses   APPENDECTOMY     BREAST EXCISIONAL BIOPSY Right    CARDIAC CATHETERIZATION  06/26/2018   CHOLECYSTECTOMY     COLONOSCOPY  05/27/2002, 08/08/11   2003 diverticulosis, hemorrhoids 2012 same + cecal polyp   EYE SURGERY     KNEE ARTHROSCOPY  2008   right   KYPHOPLASTY Bilateral 08/19/2020   Procedure: KYPHOPLASTY LUMBAR ONE;  Surgeon: Vallarie Mare, MD;  Location: [place];  Service: Neurosurgery;  Laterality: Bilateral;  posterior   LEFT HEART CATH AND CORONARY ANGIOGRAPHY N/A 06/26/2018   Procedure: LEFT HEART CATH AND CORONARY ANGIOGRAPHY;  Surgeon: Wellington Hampshire, MD;  Location: [place] [facility];  Service: Cardiovascular;  Laterality: N/A;   MELANOMA EXCISION Left 07/2013   foot   TOTAL HIP ARTHROPLASTY  02/2010   left ; Dr Maureen Ralphs   TOTAL HIP ARTHROPLASTY  03/18/2012   Procedure: TOTAL HIP ARTHROPLASTY;  Surgeon: Gearlean Alf,  MD;  Location: [facility];  Service: Orthopedics;  Laterality: Right;   UPPER GASTROINTESTINAL ENDOSCOPY  04/21/2009, 08/08/11   2010 gastritis ;2012 small hiatal hernia. Dr Carlean Purl   WRIST FRACTURE SURGERY Left     Social History:  reports that she has never smoked. She has never used smokeless tobacco. She reports current alcohol use. She reports that she does not use drugs.  Allergies: Allergies  Allergen Reactions   Achromycin [Tetracycline Hcl] Shortness Of Breath    Dyspnea     Penicillins     Facial angioedema Did it involve swelling of the face/tongue/throat, SOB, or low BP?  Yes Did it involve sudden or severe rash/hives, skin peeling, or any reaction on the inside of your mouth or nose? No Did you need to seek medical attention at a hospital or doctor's office? No When did it last happen?      30+ years If all above answers are "NO", may proceed with cephalosporin use.    Sulfamethoxazole-Trimethoprim Hives   Nickel Itching   Zanaflex [Tizanidine Hydrochloride]     Causes more pain     Family History:  Family History  Problem Relation Age of Onset   Heart attack Mother 70   COPD Mother    Breast cancer Mother 69   Hypertension Father    Diabetes Father    Heart failure Father 36   Breast cancer Paternal Aunt        cns mets   COPD Maternal Grandmother    Heart disease Paternal Grandmother        died 93   Diabetes Paternal Grandmother    Stroke Paternal Grandfather        in late 58s   Heart attack Brother 48   Colon cancer Neg Hx      Current Outpatient Medications:    ALPRAZolam (XANAX) 1 MG tablet, TAKE 1 TABLET BY MOUTH THREE TIMES A DAY AS NEEDED FOR ANXIETY, Disp: 90 tablet, Rfl: 0   apixaban (ELIQUIS) 5 MG TABS tablet, Take 1 tablet (5 mg total) by mouth 2 (two) times daily., Disp: 56 tablet, Rfl: 0   atorvastatin (LIPITOR) 40 MG tablet, Take 1 tablet (40 mg total) by mouth daily., Disp: 90 tablet, Rfl: 3   Calcium Carbonate (CALCIUM 600 PO), Take 1 tablet by mouth daily., Disp: , Rfl:    clindamycin (CLEOCIN T) 1 % lotion, Apply 1 application topically 2 (two) times daily as needed (rosacea (skin irritation)). , Disp: , Rfl:    clobetasol (TEMOVATE) 0.05 % external solution, Apply 1 application topically daily as needed (rosacea)., Disp: , Rfl:    cyclobenzaprine (FLEXERIL) 5 MG tablet, Take 5 mg by mouth at bedtime as needed for muscle spasms., Disp: , Rfl:    fluocinonide (LIDEX) 0.05 % external solution, Apply 1 application topically daily as needed (rosacea)., Disp: , Rfl:    FLUoxetine (PROZAC) 40 MG capsule, TAKE 1 CAPSULE BY  MOUTH EVERY DAY, Disp: 90 capsule, Rfl: 0   furosemide (LASIX) 40 MG tablet, Take 1 tablet (40 mg total) by mouth daily., Disp: 90 tablet, Rfl: 1   gabapentin (NEURONTIN) 300 MG capsule, Take 300 mg by mouth at bedtime. TAKE 1 CAPSULE BY MOUTH EVERY DAY AT BEDTIME FOR 30 DAYS, Disp: , Rfl:    HYDROcodone-acetaminophen (NORCO/VICODIN) 5-325 MG tablet, Take 1 tablet by mouth every 6 (six) hours as needed for moderate pain., Disp: 30 tablet, Rfl: 0   methocarbamol (ROBAXIN) 500 MG tablet, Take 500 mg by mouth  every 8 (eight) hours as needed., Disp: , Rfl:    metoprolol succinate (TOPROL-XL) 100 MG 24 hr tablet, TAKE 1 TABLET BY MOUTH EVERY DAY, Disp: 30 tablet, Rfl: 5   metroNIDAZOLE (METROCREAM) 0.75 % cream, Apply 1 application topically 2 (two) times daily as needed (rosacea). , Disp: , Rfl:    Multiple Vitamin (MULTIVITAMIN WITH MINERALS) TABS tablet, Take 1 tablet by mouth daily. Centrum Silver, Disp: , Rfl:    omeprazole (PRILOSEC) 20 MG capsule, Take 2 capsules (40 mg total) by mouth daily., Disp: 60 capsule, Rfl: 0   potassium chloride (KLOR-CON) 10 MEQ tablet, Take 1 tablet (10 mEq total) by mouth daily., Disp: 90 tablet, Rfl: 3   sacubitril-valsartan (ENTRESTO) 24-26 MG, Take 1 tablet by mouth 2 (two) times daily., Disp: 60 tablet, Rfl: 0  Review of Systems:  Constitutional: Denies fever, chills, diaphoresis, appetite change and fatigue.  HEENT: Denies photophobia, eye pain, redness, hearing loss, ear pain, congestion, sore throat, rhinorrhea, sneezing, mouth sores, trouble swallowing, neck pain, neck stiffness and tinnitus.   Respiratory: Denies SOB, DOE, cough, chest tightness,  and wheezing.   Cardiovascular: Denies chest pain, palpitations and leg swelling.  Gastrointestinal: Denies nausea, vomiting, abdominal pain, diarrhea, constipation, blood in stool and abdominal distention.  Genitourinary: Denies dysuria, urgency, frequency, hematuria, flank pain and difficulty urinating.   Endocrine: Denies: hot or cold intolerance, sweats, changes in hair or nails, polyuria, polydipsia. Musculoskeletal: Denies myalgias, back pain, joint swelling, arthralgias and gait problem.  Skin: Denies pallor, rash and wound.  Neurological: Denies dizziness, seizures, syncope, weakness, light-headedness, numbness and headaches.  Hematological: Denies adenopathy. Easy bruising, personal or family bleeding history  Psychiatric/Behavioral: Denies suicidal ideation, mood changes, confusion, nervousness, sleep disturbance and agitation    Physical Exam: Vitals:   08/03/22 1403  BP: 124/84  Pulse: 77  Temp: 97.8 F (36.6 C)  TempSrc: Oral  SpO2: 98%  Weight: 291 lb 3.2 oz (132.1 kg)    Body mass index is 46.3 kg/m.   Constitutional: NAD, calm, comfortable, obese, ambulating with a walker Eyes: PERRL, lids and conjunctivae normal ENMT: Mucous membranes are moist.  Respiratory: clear to auscultation bilaterally, no wheezing, no crackles. Normal respiratory effort. No accessory muscle use.  Cardiovascular: Regular rate and rhythm, no murmurs / rubs / gallops. No extremity edema.  Psychiatric: Normal judgment and insight. Alert and oriented x 3. Normal mood.    Impression and Plan:  Hospital discharge follow-up  Morbid obesity ([place])  OSA on CPAP  Stage 3a chronic kidney disease (HCC)  Chronic diastolic CHF (congestive heart failure) [facility])  [facility] charts have been reviewed in detail, I have also reviewed consultation with cardiology following discharge. -Medication reconciliation has been performed.  She was started on Entresto and taken off of diltiazem. -We have discussed continued CPAP compliance and compliance with follow-up with heart failure team.  She agrees.  Medical decision making of moderate complexity was utilized today.    Lelon Frohlich, MD [place] [facility]

## 2022-08-05 ENCOUNTER — Other Ambulatory Visit: Payer: Self-pay | Admitting: Internal Medicine

## 2022-08-05 DIAGNOSIS — F419 Anxiety disorder, unspecified: Secondary | ICD-10-CM

## 2022-08-14 ENCOUNTER — Telehealth: Payer: Self-pay | Admitting: Cardiovascular Disease

## 2022-08-14 NOTE — Telephone Encounter (Signed)
Just FYI  Patient states she was told to call Dr. Elmarie Shiley office if she had not heart from [facility] clinic regarding her referral. She called to let us know that it has been a couple of weeks and no one has reached out to her. I gave her Heart and Vascular's phone number for her to contact and get scheduled.

## 2022-08-25 ENCOUNTER — Other Ambulatory Visit: Payer: Self-pay | Admitting: Internal Medicine

## 2022-08-27 ENCOUNTER — Other Ambulatory Visit: Payer: Self-pay

## 2022-08-27 ENCOUNTER — Encounter (HOSPITAL_COMMUNITY): Payer: Self-pay

## 2022-08-27 ENCOUNTER — Emergency Department (HOSPITAL_COMMUNITY): Payer: Medicare Other

## 2022-08-27 ENCOUNTER — Emergency Department (HOSPITAL_COMMUNITY)
Admission: EM | Admit: 2022-08-27 | Discharge: 2022-08-28 | Disposition: A | Discharge status: Home / Self Care | Payer: Medicare Other | Attending: Emergency Medicine | Admitting: Emergency Medicine

## 2022-08-27 DIAGNOSIS — R0602 Shortness of breath: Secondary | ICD-10-CM | POA: Diagnosis present

## 2022-08-27 DIAGNOSIS — R059 Cough, unspecified: Secondary | ICD-10-CM | POA: Diagnosis not present

## 2022-08-27 DIAGNOSIS — Z20822 Contact with and (suspected) exposure to covid-19: Secondary | ICD-10-CM | POA: Diagnosis not present

## 2022-08-27 DIAGNOSIS — I110 Hypertensive heart disease with heart failure: Secondary | ICD-10-CM | POA: Diagnosis not present

## 2022-08-27 DIAGNOSIS — I509 Heart failure, unspecified: Secondary | ICD-10-CM | POA: Diagnosis not present

## 2022-08-27 DIAGNOSIS — R079 Chest pain, unspecified: Secondary | ICD-10-CM | POA: Insufficient documentation

## 2022-08-27 LAB — CBC WITH DIFFERENTIAL/PLATELET
Abs Immature Granulocytes: 0.02 10*3/uL (ref 0.00–0.07)
Basophils Absolute: 0 10*3/uL (ref 0.0–0.1)
Basophils Relative: 0 %
Eosinophils Absolute: 0.2 10*3/uL (ref 0.0–0.5)
Eosinophils Relative: 2 %
HCT: 39.5 % (ref 36.0–46.0)
Hemoglobin: 12.9 g/dL (ref 12.0–15.0)
Immature Granulocytes: 0 %
Lymphocytes Relative: 13 %
Lymphs Abs: 1.1 10*3/uL (ref 0.7–4.0)
MCH: 29.7 pg (ref 26.0–34.0)
MCHC: 32.7 g/dL (ref 30.0–36.0)
MCV: 90.8 fL (ref 80.0–100.0)
Monocytes Absolute: 0.6 10*3/uL (ref 0.1–1.0)
Monocytes Relative: 7 %
Neutro Abs: 6.6 10*3/uL (ref 1.7–7.7)
Neutrophils Relative %: 78 %
Platelets: 167 10*3/uL (ref 150–400)
RBC: 4.35 MIL/uL (ref 3.87–5.11)
RDW: 15.4 % (ref 11.5–15.5)
WBC: 8.5 10*3/uL (ref 4.0–10.5)
nRBC: 0 % (ref 0.0–0.2)

## 2022-08-27 LAB — COMPREHENSIVE METABOLIC PANEL
ALT: 13 U/L (ref 0–44)
AST: 21 U/L (ref 15–41)
Albumin: 3.4 g/dL — ABNORMAL LOW (ref 3.5–5.0)
Alkaline Phosphatase: 82 U/L (ref 38–126)
Anion gap: 10 (ref 5–15)
BUN: 17 mg/dL (ref 8–23)
CO2: 25 mmol/L (ref 22–32)
Calcium: 8.5 mg/dL — ABNORMAL LOW (ref 8.9–10.3)
Chloride: 102 mmol/L (ref 98–111)
Creatinine, Ser: 1.13 mg/dL — ABNORMAL HIGH (ref 0.44–1.00)
GFR, Estimated: 52 mL/min — ABNORMAL LOW (ref >60)
Glucose, Bld: 105 mg/dL — ABNORMAL HIGH (ref 70–99)
Potassium: 3.9 mmol/L (ref 3.5–5.1)
Sodium: 137 mmol/L (ref 135–145)
Total Bilirubin: 0.6 mg/dL (ref 0.3–1.2)
Total Protein: 6.3 g/dL — ABNORMAL LOW (ref 6.5–8.1)

## 2022-08-27 LAB — TROPONIN I (HIGH SENSITIVITY): Troponin I (High Sensitivity): 4 ng/L (ref <18)

## 2022-08-27 LAB — RESP PANEL BY RT-PCR (RSV, FLU A&B, COVID)  RVPGX2
Influenza A by PCR: NEGATIVE
Influenza B by PCR: NEGATIVE
Resp Syncytial Virus by PCR: NEGATIVE
SARS Coronavirus 2 by RT PCR: NEGATIVE

## 2022-08-27 LAB — BRAIN NATRIURETIC PEPTIDE: B Natriuretic Peptide: 392.1 pg/mL — ABNORMAL HIGH (ref 0.0–100.0)

## 2022-08-27 MED ORDER — OXYCODONE-ACETAMINOPHEN 5-325 MG PO TABS
1.0000 | ORAL_TABLET | Freq: Once | ORAL | Status: AC | PRN
  Administered 2022-08-28: 1 via ORAL
  Filled 2022-08-27: qty 1

## 2022-08-27 MED ORDER — FUROSEMIDE 10 MG/ML IJ SOLN
20.0000 mg | Freq: Once | INTRAMUSCULAR | Status: AC
  Administered 2022-08-28: 20 mg via INTRAVENOUS
  Filled 2022-08-27: qty 2

## 2022-08-27 MED ORDER — LIDOCAINE 4 % EX CREA
TOPICAL_CREAM | Freq: Once | CUTANEOUS | Status: AC
  Administered 2022-08-28: 1 via TOPICAL
  Filled 2022-08-27: qty 5

## 2022-08-27 NOTE — ED Provider Notes (Signed)
[facility] EMERGENCY DEPARTMENT Provider Note   CSN: [id number] Arrival date & time: 08/27/22  2055     History  No chief complaint on file.   Kathy [name] is a 71 y.o. female. With pmh HFpEF, HTN, morbid obesity, OSA on CPAP, A.Fib on eliquis, pulmonary HTN, recent admission for heart failure and IV diuresis in November 2023 who presents with atraumatic left shoulder pain associated with left chest pain, shortness of breath and cough.  EMS administered morphine, nitroglycerin and full dose aspirin.  She did not have significant relief of pain.  She did not feel much difference with the nitroglycerin.  She denies any recent injuries but does note increased coughing recently and history of costochondritis.  It hurts to press on her chest in the area that she is having pain as well as her left shoulder.  There is no numbness, no tingling, no loss of sensation or weakness.  She has been having some mild difficulty with breathing on ambulation but no orthopnea, no PND no increased welling in her lower extremities.  She recently checked her weight and she was 290 pounds.  She does not feel like she has been gaining significant weight since leaving the hospital.  She does have history of DVT but is compliant with her Eliquis for A-fib.  HPI     Home Medications Prior to Admission medications   Medication Sig Start Date End Date Taking? Authorizing Provider  ALPRAZolam [name]) 1 MG tablet TAKE 1 TABLET BY MOUTH THREE TIMES A DAY AS NEEDED FOR ANXIETY 08/08/22   Isaac Bliss, Rayford Halsted, MD  apixaban (ELIQUIS) 5 MG TABS tablet Take 1 tablet (5 mg total) by mouth 2 (two) times daily. 05/09/22   Isaac Bliss, Rayford Halsted, MD  atorvastatin (LIPITOR) 40 MG tablet Take 1 tablet (40 mg total) by mouth daily. 12/05/21   Nahser, Wonda Cheng, MD  Calcium Carbonate (CALCIUM 600 PO) Take 1 tablet by mouth daily.    [provider]  clindamycin (CLEOCIN T) 1 % lotion Apply 1  application topically 2 (two) times daily as needed (rosacea (skin irritation)).     [provider]  clobetasol (TEMOVATE) 0.05 % external solution Apply 1 application topically daily as needed (rosacea).    [provider]  cyclobenzaprine (FLEXERIL) 5 MG tablet Take 5 mg by mouth at bedtime as needed for muscle spasms.    [provider]  fluocinonide (LIDEX) 0.05 % external solution Apply 1 application topically daily as needed (rosacea).    [provider]  FLUoxetine (PROZAC) 40 MG capsule TAKE 1 CAPSULE BY MOUTH EVERY DAY 07/10/22   Isaac Bliss, Rayford Halsted, MD  furosemide (LASIX) 40 MG tablet Take 1 tablet (40 mg total) by mouth daily. 07/30/22   Alma Friendly, MD  gabapentin (NEURONTIN) 300 MG capsule Take 300 mg by mouth at bedtime. TAKE 1 CAPSULE BY MOUTH EVERY DAY AT BEDTIME FOR 30 DAYS    [provider]  HYDROcodone-acetaminophen (NORCO/VICODIN) 5-325 MG tablet Take 1 tablet by mouth every 6 (six) hours as needed for moderate pain. 06/06/22   Isaac Bliss, Rayford Halsted, MD  methocarbamol (ROBAXIN) 500 MG tablet Take 500 mg by mouth every 8 (eight) hours as needed. 05/26/22   [provider]  metoprolol succinate (TOPROL-XL) 100 MG 24 hr tablet TAKE 1 TABLET BY MOUTH EVERY DAY 05/05/22   Nahser, Wonda Cheng, MD  metroNIDAZOLE (METROCREAM) 0.75 % cream Apply 1 application topically 2 (two) times daily  as needed (rosacea).     [provider]  Multiple Vitamin (MULTIVITAMIN WITH MINERALS) TABS tablet Take 1 tablet by mouth daily. Centrum Silver    [provider]  omeprazole (PRILOSEC) 20 MG capsule Take 2 capsules (40 mg total) by mouth daily. 07/24/22   Wilnette Kales, PA  potassium chloride (KLOR-CON) 10 MEQ tablet Take 1 tablet (10 mEq total) by mouth daily. 12/05/21   Nahser, Wonda Cheng, MD  sacubitril-valsartan (ENTRESTO) 24-26 MG Take 1 tablet by mouth 2 (two) times daily. 07/30/22 08/29/22  Alma Friendly,  MD      Allergies    Achromycin [tetracycline hcl], Penicillins, Sulfamethoxazole-trimethoprim, Nickel, and Zanaflex [tizanidine hydrochloride]    Review of Systems   Review of Systems  Physical Exam Updated Vital Signs There were no vitals taken for this visit. Physical Exam Constitutional: Alert and oriented. No acute distress, nontoxic Eyes: Conjunctivae are normal. ENT      Head: Normocephalic and atraumatic.      Nose: No congestion.      Mouth/Throat: Mucous membranes are moist.      Neck: No stridor. Cardiovascular: S1, S2, irregularly irregular rhythm with equal palpable radial pulses and distal extremities are warm well-perfused. Chest wall: Tenderness to palpation overlying the left anterior chest wall and left anterior shoulder, no external evidence of traumatic injury Respiratory: Normal respiratory effort.  Slightly decreased breath sounds limited by body habitus.  O2 sat 97 on RA Gastrointestinal: Soft and nontender.  Musculoskeletal: Normal range of motion in all extremities. Equal nontender edema bilateral lower extremities Neurologic: Normal speech and language.  No facial droop.  Moving all extremities equally.  Sensation grossly intact.  No gross focal neurologic deficits are appreciated. Skin: Skin is warm, dry and intact. No rash noted. Psychiatric: Mood and affect are normal. Speech and behavior are normal.  ED Results / Procedures / Treatments   Labs (all labs ordered are listed, but only abnormal results are displayed) Labs Reviewed - No data to display  EKG None  Radiology No results found.  Procedures Procedures  {Document cardiac monitor, telemetry assessment procedure when appropriate:1}  Medications Ordered in ED Medications - No data to display  ED Course/ Medical Decision Making/ A&P                           Medical Decision Making [name] is a 71 y.o. female. With pmh HFpEF, HTN, morbid obesity, OSA on CPAP, A.Fib on  eliquis, pulmonary HTN, recent admission for heart failure and IV diuresis in November 2023 who presents with atraumatic left shoulder pain associated with left chest pain, shortness of breath and cough.    Regarding the patient's chest pain, generally it sounds quite atypical in nature.  Additionally, it is reproducible on exam of the left chest wall and left shoulder.  However she is neurovascularly intact and denies any recent injury.  Her HEART score is 4 (due to RF and age) and overall has an EKG which is reassuring and unchanged from previous. Initial troponin 4 and reassuring making ACS unlikely.  Chest x-ray obtained which I personally reviewed did show cardiomegaly with some mild pulmonary vascular congestion, do suspect some underlying worsening heart failure especially with elevated BNP 392, will give IV diuresis 20 mg in the ED although do not suspect necessarily contributing to pain.  Due to atypical atraumatic pain with some associated respiratory symptoms and history of DVT, CTA PE was ordered.  Amount and/or Complexity of Data Reviewed Labs: ordered. Radiology: ordered.  Risk OTC drugs. Prescription drug management.    Final Clinical Impression(s) / ED Diagnoses Final diagnoses:  None    Rx / DC Orders ED Discharge Orders     None

## 2022-08-27 NOTE — ED Triage Notes (Signed)
Pt arrives by [facility] from home for CP and SOB.

## 2022-08-28 ENCOUNTER — Emergency Department (HOSPITAL_COMMUNITY): Payer: Medicare Other

## 2022-08-28 LAB — TROPONIN I (HIGH SENSITIVITY): Troponin I (High Sensitivity): 4 ng/L (ref <18)

## 2022-08-28 MED ORDER — IOHEXOL 350 MG/ML SOLN
75.0000 mL | Freq: Once | INTRAVENOUS | Status: AC | PRN
  Administered 2022-08-28: 75 mL via INTRAVENOUS

## 2022-08-28 NOTE — Discharge Instructions (Signed)
You were evaluated in the Emergency Department and after careful evaluation, we did not find any emergent condition requiring admission or further testing in the hospital.  Your exam/testing today is overall reassuring.  No signs of heart strain or damage, no signs of blood clots.  Recommend follow-up with your primary care doctor to discuss her symptoms.  Please return to the Emergency Department if you experience any worsening of your condition.   Thank you for allowing [name] to be a part of your care.

## 2022-08-28 NOTE — ED Provider Notes (Signed)
  Provider Note MRN:  [id number]  Arrival date & time: 08/28/22    ED Course and Medical Decision Making  Assumed care from Dr. Nechama Guard at shift change.  Atypical chest pain with shortness of breath awaiting second troponin, CTA for PE study.  If workup reassuring patient may be appropriate for discharge.  Procedures  Final Clinical Impressions(s) / ED Diagnoses     ICD-10-CM   1. Chest pain, unspecified type  R07.9       ED Discharge Orders     None         Discharge Instructions      You were evaluated in the Emergency Department and after careful evaluation, we did not find any emergent condition requiring admission or further testing in the hospital.  Your exam/testing today is overall reassuring.  No signs of heart strain or damage, no signs of blood clots.  Recommend follow-up with your primary care doctor to discuss her symptoms.  Please return to the Emergency Department if you experience any worsening of your condition.   Thank you for allowing [name] to be a part of your care.      Barth Kirks. [place]onia Small, [place] [email or web address]    Maudie Flakes, MD 08/28/22 [phone]

## 2022-08-29 ENCOUNTER — Other Ambulatory Visit: Payer: Self-pay | Admitting: Internal Medicine

## 2022-08-29 ENCOUNTER — Encounter: Payer: Self-pay | Admitting: Nurse Practitioner

## 2022-08-29 ENCOUNTER — Ambulatory Visit: Payer: Medicare Other | Attending: Nurse Practitioner | Admitting: Nurse Practitioner

## 2022-08-29 VITALS — BP 114/62 | HR 64 | Ht 66.5 in | Wt 289.0 lb

## 2022-08-29 DIAGNOSIS — I480 Paroxysmal atrial fibrillation: Secondary | ICD-10-CM

## 2022-08-29 DIAGNOSIS — I5032 Chronic diastolic (congestive) heart failure: Secondary | ICD-10-CM

## 2022-08-29 DIAGNOSIS — I10 Essential (primary) hypertension: Secondary | ICD-10-CM | POA: Diagnosis not present

## 2022-08-29 DIAGNOSIS — R0789 Other chest pain: Secondary | ICD-10-CM

## 2022-08-29 DIAGNOSIS — I272 Pulmonary hypertension, unspecified: Secondary | ICD-10-CM

## 2022-08-29 NOTE — Progress Notes (Signed)
Office Visit    Patient Name: Kathy [name] Date of Encounter: 08/29/2022  Primary Care Provider:  Isaac Bliss, Rayford Halsted, MD Primary Cardiologist:  Mertie Moores, MD Primary Electrophysiologist: None  Chief Complaint    Kathy [name] is a 71 y.o. female with PMH of HFpEF,, morbid obesity, severe pulmonary hypertension, atrial fibrillation (on Eliquis), mild CAD, OSA (on CPAP) HLD, HTN who presents today for follow-up of chest pain.  Past Medical History    Past Medical History:  Diagnosis Date   Adenomatous colon polyp    2012   Anxiety and depression    Arthritis    Osteoarthritis, Dr Maureen Ralphs   Cancer [facility])    skin - left foot excised   Chronic diastolic CHF (congestive heart failure) (HCC)    DDD (degenerative disc disease), cervical    Deaf, right    DVT (deep venous thrombosis) ([place])    RLE DVT 08/2004, post right knee arthroscopy   Fibromyalgia    Dr Tobie Lords, Rheu   GERD (gastroesophageal reflux disease)    H/O hiatal hernia    Headache(784.0)    PAST HX MILD MIGRAINES - resolved per patient 08/18/20   History of kidney stones    passed stones   HTN (hypertension)    IBS (irritable bowel syndrome)    Internal hemorrhoid    Lumbar radiculopathy    Obesity    OSA on CPAP    Moderate with AHI 20/hr with nocturnal hypoxemia on CPAP   PAF (paroxysmal atrial fibrillation) ([place]) 06/2018   Pulmonary hypertension ([place])    Past Surgical History:  Procedure Laterality Date   ABDOMINAL HYSTERECTOMY     dysfunctional menses   APPENDECTOMY     BREAST EXCISIONAL BIOPSY Right    CARDIAC CATHETERIZATION  06/26/2018   CHOLECYSTECTOMY     COLONOSCOPY  05/27/2002, 08/08/11   2003 diverticulosis, hemorrhoids 2012 same + cecal polyp   EYE SURGERY     KNEE ARTHROSCOPY  2008   right   KYPHOPLASTY Bilateral 08/19/2020   Procedure: KYPHOPLASTY LUMBAR ONE;  Surgeon: Vallarie Mare, MD;  Location: [place];  Service: Neurosurgery;  Laterality: Bilateral;   posterior   LEFT HEART CATH AND CORONARY ANGIOGRAPHY N/A 06/26/2018   Procedure: LEFT HEART CATH AND CORONARY ANGIOGRAPHY;  Surgeon: Wellington Hampshire, MD;  Location: [facility];  Service: Cardiovascular;  Laterality: N/A;   MELANOMA EXCISION Left 07/2013   foot   TOTAL HIP ARTHROPLASTY  02/2010   left ; Dr Maureen Ralphs   TOTAL HIP ARTHROPLASTY  03/18/2012   Procedure: TOTAL HIP ARTHROPLASTY;  Surgeon: Gearlean Alf, MD;  Location: [facility];  Service: Orthopedics;  Laterality: Right;   UPPER GASTROINTESTINAL ENDOSCOPY  04/21/2009, 08/08/11   2010 gastritis ;2012 small hiatal hernia. Dr Carlean Purl   WRIST FRACTURE SURGERY Left     Allergies  Allergies  Allergen Reactions   Achromycin [Tetracycline Hcl] Shortness Of Breath    Dyspnea     Penicillins     Facial angioedema Did it involve swelling of the face/tongue/throat, SOB, or low BP? Yes Did it involve sudden or severe rash/hives, skin peeling, or any reaction on the inside of your mouth or nose? No Did you need to seek medical attention at a hospital or doctor's office? No When did it last happen?      30+ years If all above answers are "NO", may proceed with cephalosporin use.    Sulfamethoxazole-Trimethoprim Hives   Nickel Itching  Zanaflex [Tizanidine Hydrochloride]     Causes more pain     History of Present Illness    Kathy [name]  is a 71 year old female with the above mention past medical history who presents today for follow-up of chest pain.  Kathy [name] was initially seen in 06/2018 in the ED for shortness of breath.  She was found to have new onset AF with RVR with rates in the 130s.  She had a CTA completed to rule out dissection which showed no evidence of acute aortic pathology.  Patient's initial troponins were negative but subsequent was 0.89.  She was given IV metoprolol and converted to sinus rhythm.  2D echo was completed showing normal LV systolic function and severe concentric LVH with grade 2 DD and  pulmonary hypertension with pressures of 84.  Left heart catheterization was also performed and showed mild nonobstructive CAD with normal LV function.  She was seen in follow-up by Dr. Acie Fredrickson and was noted to be back in atrial fibs/flutter.  She underwent DCCV on 11/14/2018 and converted to sinus rhythm.  She was admitted to the hospital and 02/2020 in [place] after developing A-fib and developing presyncope.  She was transported to the hospital via EMS and converted on her own and did not require cardioversion.  She was started on Cardizem Lasix was increased to 40 mg a day.  She was admitted on 07/26/2022 for CHF and shortness of breath.  TTE was performed showing that was surgically drained for elevated LVEF 50-55% (down from 60-65%) and severe RV systolic dysfunction.  She was started on Entresto 24/26 mg and Lasix was increased to 40 mg daily.  She was seen in follow-up on 08/02/22 and Cardizem was discontinued due to low BPs and patient was referred to advanced CHF clinic for management of pulmonary HTN.  She remained in sinus rhythm during visit.  She was seen in the ED on 08/28/2022 for complaint of chest pain and shortness of breath.  She arrived by EMS and was given morphine, nitro and aspirin with significant relief of pain.  She endorsed pain with palpitation of chest as well as left shoulder pain.  She endorsed breathing differently with ambulation and chest x-ray was positive for cardiomegaly with mild pulmonary vascular congestion.  BNP was 392 and IV Lasix was given due to volume overload.  Kathy [name] presents today for post ED visit follow-up alone.  Since last being seen in the office patient reports that she is feeling much better and has not experienced any chest pain since her ED visit.  She endorsed shortness of breath that continues to be bothersome with or without activity.  On examination today she was volume up slightly with +1 lower extremity edema and NYHA class II symptoms.  She  denies any indiscretions with sodium and reports that she is mindful of salt content of her food.  Her blood pressure today was well-controlled at 114/62 and heart rate was 64 bpm.  She reports full compliance with her current medications and denies any adverse reactions.  During her visit we discussed the pathophysiology of heart failure and pulmonary hypertension.  She is asymptomatic when in atrial fibrillation and was confirmed by auscultation today.  Patient denies chest pain, palpitations, dyspnea, PND, orthopnea, nausea, vomiting, dizziness, syncope, edema, weight gain, or early satiety.  Home Medications    Current Outpatient Medications  Medication Sig Dispense Refill   ALPRAZolam (XANAX) 1 MG tablet TAKE 1 TABLET BY MOUTH THREE TIMES  A DAY AS NEEDED FOR ANXIETY (Patient taking differently: See admin instructions. TAKE 1 TABLET BY MOUTH THREE TIMES A DAY AS NEEDED FOR ANXIETY) 90 tablet 0   apixaban (ELIQUIS) 5 MG TABS tablet Take 1 tablet (5 mg total) by mouth 2 (two) times daily. 56 tablet 0   atorvastatin (LIPITOR) 40 MG tablet Take 1 tablet (40 mg total) by mouth daily. 90 tablet 3   Calcium Carbonate (CALCIUM 600 PO) Take 1 tablet by mouth daily.     clindamycin (CLEOCIN T) 1 % lotion Apply 1 application topically 2 (two) times daily as needed (rosacea (skin irritation)).      clobetasol (TEMOVATE) 0.05 % external solution Apply 1 application topically daily as needed (rosacea).     cyclobenzaprine (FLEXERIL) 5 MG tablet Take 5 mg by mouth at bedtime as needed for muscle spasms.     ENTRESTO 24-26 MG TAKE 1 TABLET BY MOUTH TWICE A DAY 60 tablet 0   fluocinonide (LIDEX) 0.05 % external solution Apply 1 application topically daily as needed (rosacea).     FLUoxetine (PROZAC) 40 MG capsule TAKE 1 CAPSULE BY MOUTH EVERY DAY 90 capsule 0   furosemide (LASIX) 40 MG tablet Take 1 tablet (40 mg total) by mouth daily. 90 tablet 1   gabapentin (NEURONTIN) 300 MG capsule Take 300 mg by mouth at  bedtime.     HYDROcodone-acetaminophen (NORCO/VICODIN) 5-325 MG tablet Take 1 tablet by mouth every 6 (six) hours as needed for moderate pain. 30 tablet 0   methocarbamol (ROBAXIN) 500 MG tablet Take 500 mg by mouth every 8 (eight) hours as needed.     metoprolol succinate (TOPROL-XL) 100 MG 24 hr tablet TAKE 1 TABLET BY MOUTH EVERY DAY (Patient taking differently: Take 100 mg by mouth daily.) 30 tablet 5   metroNIDAZOLE (METROCREAM) 0.75 % cream Apply 1 application topically 2 (two) times daily as needed (rosacea).      Multiple Vitamin (MULTIVITAMIN WITH MINERALS) TABS tablet Take 1 tablet by mouth daily. Centrum Silver     omeprazole (PRILOSEC) 20 MG capsule Take 2 capsules (40 mg total) by mouth daily. 60 capsule 0   omeprazole (PRILOSEC) 40 MG capsule TAKE 1 CAPSULE BY MOUTH EVERY DAY 90 capsule 1   potassium chloride (KLOR-CON) 10 MEQ tablet Take 1 tablet (10 mEq total) by mouth daily. 90 tablet 3   No current facility-administered medications for this visit.     Review of Systems  Please see the history of present illness.    (+) This of breath with exertion (+) Lower extremity edema  All other systems reviewed and are otherwise negative except as noted above.  Physical Exam    Wt Readings from Last 3 Encounters:  08/29/22 289 lb (131.1 kg)  08/03/22 291 lb 3.2 oz (132.1 kg)  08/02/22 292 lb 9.6 oz (132.7 kg)   VS: Vitals:   08/29/22 1535  BP: 114/62  Pulse: 64  SpO2: 97%  ,Body mass index is 45.95 kg/m.  Constitutional:      Appearance: Healthy appearance. Not in distress.  Neck:     Vascular: JVD normal.  Pulmonary:     Effort: Pulmonary effort is normal.     Breath sounds: No wheezing. No rales. Diminished in the bases Cardiovascular:     Irregularly irregular normal S1. Normal S2.      Murmurs: There is no murmur.  Edema:    Peripheral edema absent.  Abdominal:     Palpations: Abdomen is soft non tender.  There is no hepatomegaly.  Skin:    General: Skin  is warm and dry.  Neurological:     General: No focal deficit present.     Mental Status: Alert and oriented to person, place and time.     Cranial Nerves: Cranial nerves are intact.  EKG/LABS/Other Studies Reviewed    ECG personally reviewed by me today -none completed today  Risk Assessment/Calculations:    CHA2DS2-VASc Score = 4   This indicates a 4.8% annual risk of stroke. The patient's score is based upon: CHF History: 1 HTN History: 1 Diabetes History: 0 Stroke History: 0 Vascular Disease History: 0 Age Score: 1 Gender Score: 1           Lab Results  Component Value Date   WBC 8.5 08/27/2022   HGB 12.9 08/27/2022   HCT 39.5 08/27/2022   MCV 90.8 08/27/2022   PLT 167 08/27/2022   Lab Results  Component Value Date   CREATININE 1.13 (H) 08/27/2022   BUN 17 08/27/2022   NA 137 08/27/2022   K 3.9 08/27/2022   CL 102 08/27/2022   CO2 25 08/27/2022   Lab Results  Component Value Date   ALT 13 08/27/2022   AST 21 08/27/2022   ALKPHOS 82 08/27/2022   BILITOT 0.6 08/27/2022   Lab Results  Component Value Date   CHOL 142 10/12/2021   HDL 51.00 10/12/2021   LDLCALC 71 10/12/2021   LDLDIRECT 143.2 01/22/2013   TRIG 98.0 10/12/2021   CHOLHDL 3 10/12/2021    Lab Results  Component Value Date   HGBA1C 5.8 10/12/2021    Assessment & Plan    1.  Atypical chest pain -Today patient reports resolution of chest pain but does still report shoulder pain that she feels is related to her costochondritis -Nitrostat 0.4 mg as needed -We reviewed the importance of ED follow-up if chest pain reoccurs and is not relieved with as needed nitroglycerin.  2.  HFpEF/pulmonary HTN: -On examination patient had NYHA Class II symptoms with shortness of breath noted with exertion. -Chest x-ray from recent ED visit showed pulmonary edema with bilateral vascular congestion -She will increase Lasix to 40 mg twice daily x 3 days and return to 40 mg daily -Increase potassium to  10 mEq twice daily x 3 days and then return to 10 mEq daily -Continue GDMT with Entresto 24/26 mg and Toprol-XL 100 mg daily -BMET in 1 week -Patient was referred to advanced heart failure clinic met previous visit with Dr. Acie Fredrickson but reports she has not been contacted as of yet.  We will reach out to heart failure clinic to expedite referral at this time. -Low sodium diet, fluid restriction <2L, and daily weights encouraged. Educated to contact our office for weight gain of 2 lbs overnight or 5 lbs in one week.   3.  History of PAF: -Heart rate today was controlled atrial fibrillation with heart rate of 64 bpm. -Continue current rate control with Toprol-XL 100 mg daily -Patient reports no bleeding on Eliquis 5 mg twice daily and current dose appropriate based on most recent creatinine of 1.13  4.  Essential hypertension: -Blood pressure today was well-controlled 114/62 -Continue Toprol as noted above  Disposition: Follow-up with Mertie Moores, MD or APP in as scheduled     Medication Adjustments/Labs and Tests Ordered: Current medicines are reviewed at length with the patient today.  Concerns regarding medicines are outlined above.   Signed, Mable Fill, Marissa Nestle, NP 08/29/2022, 6:50 PM  [facility]  Note:  This document was prepared using Dragon voice recognition software and may include unintentional dictation errors.

## 2022-08-29 NOTE — Patient Instructions (Addendum)
Medication Instructions:  INCREASE Lasix to '40mg'$  take 1 tablet twice a day for 3 days then go back to once a day INCREASE Potassium to 1 tablet twice a day for 3 days then go back to 1 tablet daily *If you need a refill on your cardiac medications before your next appointment, please call your pharmacy*   Lab Work: Next week (Tuesday) BMET If you have labs (blood work) drawn today and your tests are completely normal, you will receive your results only by: [facility] (if you have MyChart) OR A paper copy in the mail If you have any lab test that is abnormal or we need to change your treatment, we will call you to review the results.   Testing/Procedures: None ordered   Follow-Up: At [facility], you and your health needs are our priority.  As part of our continuing mission to provide you with exceptional heart care, we have created designated Provider Care Teams.  These Care Teams include your primary Cardiologist (physician) and Advanced Practice Providers (APPs -  Physician Assistants and Nurse Practitioners) who all work together to provide you with the care you need, when you need it.  We recommend signing up for the patient portal called "MyChart".  Sign up information is provided on this After Visit Summary.  MyChart is used to connect with patients for Virtual Visits (Telemedicine).  Patients are able to view lab/test results, encounter notes, upcoming appointments, etc.  Non-urgent messages can be sent to your provider as well.   To learn more about what you can do with MyChart, go to [email or web address].    Your next appointment:   FOLLOW UP AS SCHEDULED  The format for your next appointment:   In Person  Provider:   Mertie Moores, MD     Other Instructions   Important Information About Sugar

## 2022-08-30 ENCOUNTER — Telehealth: Payer: Self-pay | Admitting: Nurse Practitioner

## 2022-08-30 MED ORDER — NITROGLYCERIN 0.4 MG SL SUBL: 0.4000 mg | SUBLINGUAL_TABLET | SUBLINGUAL | 3 refills | Status: DC | PRN

## 2022-08-30 NOTE — Addendum Note (Signed)
Addended by: Tempie Donning on: 08/30/2022 05:48 PM   Modules accepted: Orders

## 2022-08-30 NOTE — Telephone Encounter (Signed)
  Pt c/o medication issue:  1. Name of Medication: nitroglycerine   2. How are you currently taking this medication (dosage and times per day)?   3. Are you having a reaction (difficulty breathing--STAT)? No   4. What is your medication issue? Pt said, Kathy [name] will send her a prescription for nitroglycerine. Her pharmacy haven't receive it yet and she is following up. She said, her pharmacy [id number] [place] - [address]

## 2022-09-05 ENCOUNTER — Ambulatory Visit: Payer: Medicare Other | Attending: Nurse Practitioner

## 2022-09-05 DIAGNOSIS — R0789 Other chest pain: Secondary | ICD-10-CM

## 2022-09-05 LAB — BASIC METABOLIC PANEL
BUN/Creatinine Ratio: 18 (ref 12–28)
BUN: 19 mg/dL (ref 8–27)
CO2: 27 mmol/L (ref 20–29)
Calcium: 9.5 mg/dL (ref 8.7–10.3)
Chloride: 97 mmol/L (ref 96–106)
Creatinine, Ser: 1.05 mg/dL — ABNORMAL HIGH (ref 0.57–1.00)
Glucose: 95 mg/dL (ref 70–99)
Potassium: 3.7 mmol/L (ref 3.5–5.2)
Sodium: 139 mmol/L (ref 134–144)
eGFR: 57 mL/min/{1.73_m2} — ABNORMAL LOW (ref >59)

## 2022-09-12 ENCOUNTER — Other Ambulatory Visit: Payer: Self-pay | Admitting: Internal Medicine

## 2022-09-12 DIAGNOSIS — F419 Anxiety disorder, unspecified: Secondary | ICD-10-CM

## 2022-09-14 ENCOUNTER — Telehealth: Payer: Self-pay | Admitting: Cardiovascular Disease

## 2022-09-14 NOTE — Telephone Encounter (Signed)
Received call from home care nurse,Leigh with [facility]. She was concerned that patient's BP was in the 80's today and wanted to make [name] aware. She states patient was asymptomatic. She has ordered a BP cuff for patient also. She is going to do a follow up call this weekend as well.  Spoke with patient who reports feeling good with no symptoms. She states she has an older cuff that she will use until the new on arrives. If she continues to get low readings she will call us back. Patient has an appointment with [facility] 09/22/22 and with Dr Acie Fredrickson on 11/07/22.

## 2022-09-14 NOTE — Telephone Encounter (Signed)
Pt c/o BP issue: STAT if pt c/o blurred vision, one-sided weakness or slurred speech  1. What are your last 5 BP readings?   122/78 1'07/68 88/56 86/62 '$  2. Are you having any other symptoms (ex. Dizziness, headache, blurred vision, passed out)?   No  3. What is your BP issue?   Caller stated she concerned regarding the patient's low BP.  Caller stated patient is well hydrated and has been taking her medications as prescribed.

## 2022-09-22 ENCOUNTER — Encounter (HOSPITAL_COMMUNITY): Payer: Self-pay | Admitting: Cardiology

## 2022-09-22 ENCOUNTER — Ambulatory Visit (HOSPITAL_COMMUNITY)
Admission: RE | Admit: 2022-09-22 | Discharge: 2022-09-22 | Disposition: A | Discharge status: Home / Self Care | Payer: Medicare Other | Source: Ambulatory Visit | Attending: Cardiology | Admitting: Cardiology

## 2022-09-22 ENCOUNTER — Other Ambulatory Visit (HOSPITAL_COMMUNITY): Payer: Self-pay

## 2022-09-22 VITALS — BP 110/70 | HR 98 | Wt 296.0 lb

## 2022-09-22 DIAGNOSIS — I2510 Atherosclerotic heart disease of native coronary artery without angina pectoris: Secondary | ICD-10-CM | POA: Insufficient documentation

## 2022-09-22 DIAGNOSIS — I5032 Chronic diastolic (congestive) heart failure: Secondary | ICD-10-CM

## 2022-09-22 DIAGNOSIS — Z79899 Other long term (current) drug therapy: Secondary | ICD-10-CM | POA: Diagnosis not present

## 2022-09-22 DIAGNOSIS — I480 Paroxysmal atrial fibrillation: Secondary | ICD-10-CM | POA: Insufficient documentation

## 2022-09-22 DIAGNOSIS — I5033 Acute on chronic diastolic (congestive) heart failure: Secondary | ICD-10-CM | POA: Diagnosis not present

## 2022-09-22 DIAGNOSIS — I110 Hypertensive heart disease with heart failure: Secondary | ICD-10-CM | POA: Diagnosis not present

## 2022-09-22 DIAGNOSIS — I272 Pulmonary hypertension, unspecified: Secondary | ICD-10-CM

## 2022-09-22 DIAGNOSIS — G4733 Obstructive sleep apnea (adult) (pediatric): Secondary | ICD-10-CM | POA: Diagnosis not present

## 2022-09-22 DIAGNOSIS — Z7901 Long term (current) use of anticoagulants: Secondary | ICD-10-CM | POA: Diagnosis not present

## 2022-09-22 LAB — BASIC METABOLIC PANEL
Anion gap: 9 (ref 5–15)
BUN: 11 mg/dL (ref 8–23)
CO2: 28 mmol/L (ref 22–32)
Calcium: 8.9 mg/dL (ref 8.9–10.3)
Chloride: 100 mmol/L (ref 98–111)
Creatinine, Ser: 1.04 mg/dL — ABNORMAL HIGH (ref 0.44–1.00)
GFR, Estimated: 58 mL/min — ABNORMAL LOW (ref >60)
Glucose, Bld: 104 mg/dL — ABNORMAL HIGH (ref 70–99)
Potassium: 4.2 mmol/L (ref 3.5–5.1)
Sodium: 137 mmol/L (ref 135–145)

## 2022-09-22 LAB — BRAIN NATRIURETIC PEPTIDE: B Natriuretic Peptide: 324.6 pg/mL — ABNORMAL HIGH (ref 0.0–100.0)

## 2022-09-22 NOTE — Progress Notes (Addendum)
ADVANCED HEART FAILURE CLINIC NOTE  Referring Physician: Isaac Bliss, [facility]*  Primary Care: Isaac Bliss, Rayford Halsted, MD Primary Cardiologist: Dr. Debara Pickett   HPI: Kathy [name] is a 72 y.o. female with HFpEF, paroxysmal atrial fibrillation, OSA (CPAP), hyperlipidemia and HTN presenting today for evaluation. Kathy [name] cardiac history dates back to 2019 when she presented to the ER  w/ complaints of dyspnea. She was found to have atrial fibrillation  w/ TTE demonstrating preserved LV function and severe LVH. She converted to NSR with metoprolol. Since that time she has also underwent LHC w/ nonobstructive CAD. Over the past 2 years she has continued to struggle with symptomatic paroxysmal atrial fibrillation. In 11/23 she was admitted with acute on chronic HFpEF; TTE at that time with dilated RV w/ moderately reduced systolic function. She was diuresed and discharged home. Since that time she has her functional status remains fairly limited.  She can perform all ADLs independently but frequently becomes very fatigued and short of breath after walking over 30 to 40 feet.  No PND and currently lower extremity edema is fairly well-controlled however she does quickly become hypervolemic with any dietary indiscretion.  Activity level/exercise tolerance: NYHA III Orthopnea:  Sleeps on 2-3 pillows Paroxysmal noctural dyspnea: Infrequent Chest pain/pressure: No Orthostatic lightheadedness: No Palpitations: No Lower extremity edema: Yes, 1-2+ Presyncope/syncope: No Cough: No  Past Medical History:  Diagnosis Date   Adenomatous colon polyp    2012   Anxiety and depression    Arthritis    Osteoarthritis, Dr Maureen Ralphs   Cancer [facility])    skin - left foot excised   Chronic diastolic CHF (congestive heart failure) (HCC)    DDD (degenerative disc disease), cervical    Deaf, right    DVT (deep venous thrombosis) ([facility])    RLE DVT 08/2004, post right knee arthroscopy   Fibromyalgia    Dr  Tobie Lords, Rhe[facility]   GERD (gastroesophageal reflux disease)    H/O hiatal hernia    Headache(784.0)    PAST HX MILD MIGRAINES - resolved per patient 08/18/20   History of kidney stones    passed stones   HTN (hypertension)    IBS (irritable bowel syndrome)    Internal hemorrhoid    Lumbar radiculopathy    Obesity    OSA on CPAP    Moderate with AHI 20/hr with nocturnal hypoxemia on CPAP   PAF (paroxysmal atrial fibrillation) ([facility]) 06/2018   Pulmonary hypertension (HCC)     Current Outpatient Medications  Medication Sig Dispense Refill   ALPRAZolam (XANAX) 1 MG tablet TAKE 1 TABLET BY MOUTH THREE TIMES A DAY AS NEEDED FOR ANXIETY 60 tablet 0   apixaban (ELIQUIS) 5 MG TABS tablet Take 1 tablet (5 mg total) by mouth 2 (two) times daily. 56 tablet 0   atorvastatin (LIPITOR) 40 MG tablet Take 1 tablet (40 mg total) by mouth daily. 90 tablet 3   Calcium Carbonate (CALCIUM 600 PO) Take 1 tablet by mouth daily.     clindamycin (CLEOCIN T) 1 % lotion Apply 1 application topically 2 (two) times daily as needed (rosacea (skin irritation)).      clobetasol (TEMOVATE) 0.05 % external solution Apply 1 application topically daily as needed (rosacea).     cyclobenzaprine (FLEXERIL) 5 MG tablet Take 5 mg by mouth at bedtime as needed for muscle spasms.     ENTRESTO 24-26 MG TAKE 1 TABLET BY MOUTH TWICE A DAY 60 tablet 0   fluocinonide (LIDEX) 0.05 % external  solution Apply 1 application topically daily as needed (rosacea).     FLUoxetine (PROZAC) 40 MG capsule TAKE 1 CAPSULE BY MOUTH EVERY DAY 90 capsule 0   furosemide (LASIX) 40 MG tablet Take 1 tablet (40 mg total) by mouth daily. 90 tablet 1   gabapentin (NEURONTIN) 300 MG capsule Take 300 mg by mouth at bedtime.     HYDROcodone-acetaminophen (NORCO/VICODIN) 5-325 MG tablet Take 1 tablet by mouth every 6 (six) hours as needed for moderate pain. 30 tablet 0   methocarbamol (ROBAXIN) 500 MG tablet Take 500 mg by mouth every 8 (eight) hours as  needed.     metoprolol succinate (TOPROL-XL) 100 MG 24 hr tablet TAKE 1 TABLET BY MOUTH EVERY DAY 30 tablet 5   metroNIDAZOLE (METROCREAM) 0.75 % cream Apply 1 application topically 2 (two) times daily as needed (rosacea).      Multiple Vitamin (MULTIVITAMIN WITH MINERALS) TABS tablet Take 1 tablet by mouth daily. Centrum Silver     nitroGLYCERIN (NITROSTAT) 0.4 MG SL tablet Place 1 tablet (0.4 mg total) under the tongue every 5 (five) minutes as needed for chest pain. 90 tablet 3   omeprazole (PRILOSEC) 40 MG capsule TAKE 1 CAPSULE BY MOUTH EVERY DAY 90 capsule 1   potassium chloride (KLOR-CON) 10 MEQ tablet Take 1 tablet (10 mEq total) by mouth daily. 90 tablet 3   No current facility-administered medications for this encounter.    Allergies  Allergen Reactions   Achromycin [Tetracycline Hcl] Shortness Of Breath    Dyspnea     Penicillins     Facial angioedema Did it involve swelling of the face/tongue/throat, SOB, or low BP? Yes Did it involve sudden or severe rash/hives, skin peeling, or any reaction on the inside of your mouth or nose? No Did you need to seek medical attention at a hospital or doctor's office? No When did it last happen?      30+ years If all above answers are "NO", may proceed with cephalosporin use.    Sulfamethoxazole-Trimethoprim Hives   Nickel Itching   Zanaflex [Tizanidine Hydrochloride]     Causes more pain       Social History   Socioeconomic History   Marital status: Widowed    Spouse name: Not on file   Number of children: 2   Years of education: Not on file   Highest education level: Not on file  Occupational History   Occupation: retired  Tobacco Use   Smoking status: Never   Smokeless tobacco: Never  Vaping Use   Vaping Use: Never used  Substance and Sexual Activity   Alcohol use: Yes    Comment: occasional   Drug use: No   Sexual activity: Not on file    Comment: Hysterectomy  Other Topics Concern   Not on file  Social History  Narrative   0 caffeine drinks    Social Determinants of Health   Financial Resource Strain: Not on file  Food Insecurity: No Food Insecurity (07/26/2022)   Hunger Vital Sign    Worried About Running Out of Food in the Last Year: Never true    Ran Out of Food in the Last Year: Never true  Transportation Needs: No Transportation Needs (07/26/2022)   PRAPARE - Hydrologist (Medical): No    Lack of Transportation (Non-Medical): No  Physical Activity: Not on file  Stress: Not on file  Social Connections: Not on file  Intimate Partner Violence: Not At Risk (07/26/2022)  Humiliation, Afraid, Rape, and Kick questionnaire    Fear of Current or Ex-Partner: No    Emotionally Abused: No    Physically Abused: No    Sexually Abused: No      Family History  Problem Relation Age of Onset   Heart attack Mother 6   COPD Mother    Breast cancer Mother 60   Hypertension Father    Diabetes Father    Heart failure Father 13   Breast cancer Paternal Aunt        cns mets   COPD Maternal Grandmother    Heart disease Paternal Grandmother        died 39   Diabetes Paternal Grandmother    Stroke Paternal Grandfather        in late 21s   Heart attack Brother 29   Colon cancer Neg Hx     PHYSICAL EXAM: Vitals:   09/22/22 1041  BP: 110/70  Pulse: 98  SpO2: 95%   GENERAL: Well nourished, well developed, and in no apparent distress at rest.  HEENT: Negative for arcus senilis or xanthelasma. There is no scleral icterus.  The mucous membranes are pink and moist.   NECK: Supple, No masses. Normal carotid upstrokes without bruits. No masses or thyromegaly.    CHEST: There are no chest wall deformities. There is no chest wall tenderness. Respirations are unlabored.  Lungs-CTA bilaterally CARDIAC: Thick neck, difficult to assess         Normal S1, S2  Normal rate with regular rhythm. No murmurs, rubs or gallops.  Pulses are 2+ and symmetrical in upper and lower  extremities.  1+ edema ABDOMEN: Soft, non-tender, non-distended. There are no masses or hepatomegaly. There are normal bowel sounds.  EXTREMITIES: Warm and well perfused with no cyanosis, clubbing.  LYMPHATIC: No axillary or supraclavicular lymphadenopathy.  NEUROLOGIC: Patient is oriented x3 with no focal or lateralizing neurologic deficits.  PSYCH: Patients affect is appropriate, there is no evidence of anxiety or depression.  SKIN: Warm and dry; no lesions or wounds.   DATA REVIEW  ECG: Atrial fibrillation (09/22/2022)  ECHO: 07/27/22: LVEF 50-55%, RV is dilated with mild to moderately reduced systolic function. RA is severely dilated. There is severe eccentric TR.    CATH: 06/26/18:  Ost RCA lesion is 30% stenosed. The left ventricular systolic function is normal. LV end diastolic pressure is mildly elevated. The left ventricular ejection fraction is 55-65% by visual estimate.   ASSESSMENT & PLAN:  Pulmonary hypertension / Severe TR - Echocardiogram's dating back to 2019 demonstrate severe TR. On her initial TTE in 2019 she has severe TR with mildly dilated RV and preserved LV function. Over the past 3 years, echocardiograms demonstrate gradual progression of TR and worsening of RV function. Uncertain if this is primary TR or a sequale of underlying pulmonary hypertension.  - Plan for RHC & TEE  2. Heart failure w/ preserved EF  - NYHA II-III symptoms; most recent TTE w/ indeterminate diastolic function; tissue doppler not consistent with significant diastolic dysfunction. Will continue lasix '40mg'$  daily for the time being, Entresto 24/'26mg'$  BID & toprol 100 XL.BNP 324 today with improvement in sCr.   3. Atrial fibrillation  - Apixaban '5mg'$   - Toprol '100mg'$  daily  - Plan for TEE/DCCV  4. OSA  - CPAP   Dawanda Mapel Advanced Heart Failure Mechanical Circulatory Support

## 2022-09-22 NOTE — Patient Instructions (Addendum)
There has been no changes to your medications.  Labs done today, your results will be available in MyChart, we will contact you for abnormal readings.   You are scheduled for a Cardiac Catheterization on Tuesday, January 23 with Dr.  Daniel Nones .  1. Please arrive at the [facility] (Main Entrance A) at [facility]: [address] [place], [place] [address] at 10:30 AM (This time is two hours before your procedure to ensure your preparation). Free valet parking service is available.   Special note: Every effort is made to have your procedure done on time. Please understand that emergencies sometimes delay scheduled procedures.  2. Diet: Do not eat solid foods after midnight.  The patient may have clear liquids until 5am upon the day of the procedure.  3.  Medication instructions in preparation for your procedure:   Contrast Allergy: No  HOLD your Lasix the morning of your procedure   On the morning of your procedure, take any morning medicines NOT listed above.  You may use sips of water.  5. Plan for one night stay--bring personal belongings. 6. Bring a current list of your medications and current insurance cards. 7. You MUST have a responsible person to drive you home. 8. Someone MUST be with you the first 24 hours after you arrive home or your discharge will be delayed. 9. Please wear clothes that are easy to get on and off and wear slip-on shoes.  You are scheduled for a TEE (Transesophageal Echocardiogram) Guided Cardioversion on Tuesday, January 23 with Dr. Daniel Nones.    DIET:  Nothing to eat or drink after midnight except a sip of water with medications (see medication instructions below)  MEDICATION INSTRUCTIONS:  Continue taking your anticoagulant (blood thinner): Apixaban (Eliquis).  You will need to continue this after your procedure until you are told by your provider that it is safe to stop.    FYI:  For your safety, and to allow [name] to monitor your vital  signs accurately during the surgery/procedure we request: If you have artificial nails, gel coating, SNS etc, please have those removed prior to your surgery/procedure. Not having the nail coverings /polish removed may result in cancellation or delay of your surgery/procedure.   Your physician recommends that you schedule a follow-up appointment in: 3 months ( April 2024)  ** please call the office in February to arrange your follow up appointment **  If you have any questions or concerns before your next appointment please send [name] a message through [place] or call our office at [phone].    TO LEAVE A MESSAGE FOR THE NURSE SELECT OPTION 2, PLEASE LEAVE A MESSAGE INCLUDING: YOUR NAME DATE OF BIRTH CALL BACK NUMBER REASON FOR CALL**this is important as we prioritize the call backs  YOU WILL RECEIVE A CALL BACK THE SAME DAY AS LONG AS YOU CALL BEFORE 4:00 PM  At the [facility], you and your health needs are our priority. As part of our continuing mission to provide you with exceptional heart care, we have created designated Provider Care Teams. These Care Teams include your primary Cardiologist (physician) and Advanced Practice Providers (APPs- Physician Assistants and Nurse Practitioners) who all work together to provide you with the care you need, when you need it.   You may see any of the following providers on your designated Care Team at your next follow up: Dr Glori Bickers Dr Loralie Champagne Dr. Roxana Hires, NP Lyda Jester, [place] Ginnie Smart  Marlyce Huge, [place] Forestine Na, NP Audry Riles, PharmD   Please be sure to bring in all your medications bottles to every appointment.

## 2022-09-27 ENCOUNTER — Other Ambulatory Visit: Payer: Self-pay | Admitting: Internal Medicine

## 2022-10-05 ENCOUNTER — Other Ambulatory Visit: Payer: Self-pay | Admitting: Internal Medicine

## 2022-10-05 DIAGNOSIS — F419 Anxiety disorder, unspecified: Secondary | ICD-10-CM

## 2022-10-10 ENCOUNTER — Ambulatory Visit (HOSPITAL_BASED_OUTPATIENT_CLINIC_OR_DEPARTMENT_OTHER)
Admission: RE | Admit: 2022-10-10 | Discharge: 2022-10-10 | Disposition: A | Discharge status: Home / Self Care | Payer: Medicare Other | Source: Ambulatory Visit | Attending: Cardiology | Admitting: Cardiology

## 2022-10-10 ENCOUNTER — Encounter (HOSPITAL_COMMUNITY): Payer: Self-pay | Admitting: Cardiology

## 2022-10-10 ENCOUNTER — Ambulatory Visit (HOSPITAL_COMMUNITY): Payer: Medicare Other | Admitting: Certified Registered Nurse Anesthetist

## 2022-10-10 ENCOUNTER — Ambulatory Visit (HOSPITAL_BASED_OUTPATIENT_CLINIC_OR_DEPARTMENT_OTHER): Payer: Medicare Other | Admitting: Certified Registered Nurse Anesthetist

## 2022-10-10 ENCOUNTER — Ambulatory Visit (HOSPITAL_COMMUNITY)
Admission: RE | Admit: 2022-10-10 | Discharge: 2022-10-10 | Disposition: A | Discharge status: Home / Self Care | Payer: Medicare Other | Attending: Cardiology | Admitting: Cardiology

## 2022-10-10 ENCOUNTER — Encounter (HOSPITAL_COMMUNITY)
Admission: RE | Disposition: A | Discharge status: Home / Self Care | Payer: Self-pay | Source: Home / Self Care | Attending: Cardiology

## 2022-10-10 ENCOUNTER — Other Ambulatory Visit: Payer: Self-pay

## 2022-10-10 DIAGNOSIS — I480 Paroxysmal atrial fibrillation: Secondary | ICD-10-CM

## 2022-10-10 DIAGNOSIS — I5032 Chronic diastolic (congestive) heart failure: Secondary | ICD-10-CM

## 2022-10-10 DIAGNOSIS — I361 Nonrheumatic tricuspid (valve) insufficiency: Secondary | ICD-10-CM

## 2022-10-10 DIAGNOSIS — I272 Pulmonary hypertension, unspecified: Secondary | ICD-10-CM | POA: Insufficient documentation

## 2022-10-10 DIAGNOSIS — I10 Essential (primary) hypertension: Secondary | ICD-10-CM

## 2022-10-10 DIAGNOSIS — Z6841 Body Mass Index (BMI) 40.0 and over, adult: Secondary | ICD-10-CM | POA: Insufficient documentation

## 2022-10-10 DIAGNOSIS — I110 Hypertensive heart disease with heart failure: Secondary | ICD-10-CM | POA: Diagnosis not present

## 2022-10-10 DIAGNOSIS — I509 Heart failure, unspecified: Secondary | ICD-10-CM | POA: Insufficient documentation

## 2022-10-10 DIAGNOSIS — I4819 Other persistent atrial fibrillation: Secondary | ICD-10-CM

## 2022-10-10 DIAGNOSIS — I071 Rheumatic tricuspid insufficiency: Secondary | ICD-10-CM | POA: Diagnosis not present

## 2022-10-10 DIAGNOSIS — I4891 Unspecified atrial fibrillation: Secondary | ICD-10-CM | POA: Diagnosis not present

## 2022-10-10 DIAGNOSIS — G4730 Sleep apnea, unspecified: Secondary | ICD-10-CM | POA: Insufficient documentation

## 2022-10-10 HISTORY — PX: CARDIOVERSION: SHX1299

## 2022-10-10 HISTORY — PX: RIGHT HEART CATH: CATH118263

## 2022-10-10 HISTORY — PX: TEE WITHOUT CARDIOVERSION: SHX5443

## 2022-10-10 LAB — CBC
HCT: 41.9 % (ref 36.0–46.0)
Hemoglobin: 13.4 g/dL (ref 12.0–15.0)
MCH: 28.6 pg (ref 26.0–34.0)
MCHC: 32 g/dL (ref 30.0–36.0)
MCV: 89.3 fL (ref 80.0–100.0)
Platelets: 228 K/uL (ref 150–400)
RBC: 4.69 MIL/uL (ref 3.87–5.11)
RDW: 15 % (ref 11.5–15.5)
WBC: 6.7 K/uL (ref 4.0–10.5)
nRBC: 0 % (ref 0.0–0.2)

## 2022-10-10 LAB — POCT I-STAT EG7
Acid-Base Excess: 2 mmol/L (ref 0.0–2.0)
Acid-Base Excess: 2 mmol/L (ref 0.0–2.0)
Bicarbonate: 28.5 mmol/L — ABNORMAL HIGH (ref 20.0–28.0)
Bicarbonate: 28.9 mmol/L — ABNORMAL HIGH (ref 20.0–28.0)
Calcium, Ion: 1.22 mmol/L (ref 1.15–1.40)
Calcium, Ion: 1.23 mmol/L (ref 1.15–1.40)
HCT: 38 % (ref 36.0–46.0)
HCT: 38 % (ref 36.0–46.0)
Hemoglobin: 12.9 g/dL (ref 12.0–15.0)
Hemoglobin: 12.9 g/dL (ref 12.0–15.0)
O2 Saturation: 55 %
O2 Saturation: 57 %
Potassium: 3.9 mmol/L (ref 3.5–5.1)
Potassium: 4 mmol/L (ref 3.5–5.1)
Sodium: 139 mmol/L (ref 135–145)
Sodium: 139 mmol/L (ref 135–145)
TCO2: 30 mmol/L (ref 22–32)
TCO2: 30 mmol/L (ref 22–32)
pCO2, Ven: 50.8 mmHg (ref 44–60)
pCO2, Ven: 51.5 mmHg (ref 44–60)
pH, Ven: 7.357 (ref 7.25–7.43)
pH, Ven: 7.358 (ref 7.25–7.43)
pO2, Ven: 31 mmHg — CL (ref 32–45)
pO2, Ven: 32 mmHg (ref 32–45)

## 2022-10-10 SURGERY — RIGHT HEART CATH
Anesthesia: LOCAL

## 2022-10-10 SURGERY — ECHOCARDIOGRAM, TRANSESOPHAGEAL
Anesthesia: Monitor Anesthesia Care

## 2022-10-10 MED ORDER — DIGOXIN 0.0625 MG HALF TABLET: 0.0625 mg | ORAL_TABLET | Freq: Every day | ORAL | Status: DC

## 2022-10-10 MED ORDER — SODIUM CHLORIDE 0.9% FLUSH: 3.0000 mL | INTRAVENOUS | Status: DC | PRN

## 2022-10-10 MED ORDER — METOPROLOL SUCCINATE ER 100 MG PO TB24: 50.0000 mg | ORAL_TABLET | Freq: Every day | ORAL | 5 refills | Status: DC

## 2022-10-10 MED ORDER — LIDOCAINE HCL (PF) 1 % IJ SOLN
INTRAMUSCULAR | Status: DC | PRN
  Administered 2022-10-10: 2 mL

## 2022-10-10 MED ORDER — SODIUM CHLORIDE 0.9 % IV SOLN: INTRAVENOUS | Status: DC

## 2022-10-10 MED ORDER — PHENYLEPHRINE 80 MCG/ML (10ML) SYRINGE FOR IV PUSH (FOR BLOOD PRESSURE SUPPORT)
PREFILLED_SYRINGE | INTRAVENOUS | Status: DC | PRN
  Administered 2022-10-10 (×2): 160 ug via INTRAVENOUS

## 2022-10-10 MED ORDER — HEPARIN (PORCINE) IN NACL 1000-0.9 UT/500ML-% IV SOLN
INTRAVENOUS | Status: DC | PRN
  Administered 2022-10-10: 500 mL

## 2022-10-10 MED ORDER — AMIODARONE HCL 200 MG PO TABS: 400.0000 mg | ORAL_TABLET | Freq: Two times a day (BID) | ORAL | Status: DC

## 2022-10-10 MED ORDER — SODIUM CHLORIDE 0.9 % IV SOLN: 250.0000 mL | INTRAVENOUS | Status: DC | PRN

## 2022-10-10 MED ORDER — DEXAMETHASONE SODIUM PHOSPHATE 10 MG/ML IJ SOLN
INTRAMUSCULAR | Status: DC | PRN
  Administered 2022-10-10: 10 mg via INTRAVENOUS

## 2022-10-10 MED ORDER — LIDOCAINE HCL (PF) 1 % IJ SOLN
INTRAMUSCULAR | Status: AC
  Filled 2022-10-10: qty 30

## 2022-10-10 MED ORDER — PROPOFOL 10 MG/ML IV BOLUS
INTRAVENOUS | Status: DC | PRN
  Administered 2022-10-10: 20 mg via INTRAVENOUS
  Administered 2022-10-10: 50 mg via INTRAVENOUS
  Administered 2022-10-10: 30 mg via INTRAVENOUS

## 2022-10-10 MED ORDER — HEPARIN (PORCINE) IN NACL 1000-0.9 UT/500ML-% IV SOLN
INTRAVENOUS | Status: AC
  Filled 2022-10-10: qty 500

## 2022-10-10 MED ORDER — DIGOXIN 125 MCG PO TABS: 0.0625 mg | ORAL_TABLET | Freq: Every day | ORAL | 3 refills | Status: DC

## 2022-10-10 MED ORDER — PROPOFOL 500 MG/50ML IV EMUL
INTRAVENOUS | Status: DC | PRN
  Administered 2022-10-10: 100 ug/kg/min via INTRAVENOUS

## 2022-10-10 MED ORDER — AMIODARONE HCL 200 MG PO TABS: 200.0000 mg | ORAL_TABLET | Freq: Two times a day (BID) | ORAL | 2 refills | Status: DC

## 2022-10-10 MED ORDER — SODIUM CHLORIDE 0.9% FLUSH: 3.0000 mL | Freq: Two times a day (BID) | INTRAVENOUS | Status: DC

## 2022-10-10 MED ORDER — DIGOXIN 125 MCG PO TABS: 0.1250 mg | ORAL_TABLET | Freq: Every day | ORAL | Status: DC

## 2022-10-10 SURGICAL SUPPLY — 7 items
CATH SWAN GANZ 7F STRAIGHT (CATHETERS) IMPLANT
GLIDESHEATH SLENDER 7FR .021G (SHEATH) IMPLANT
PACK CARDIAC CATHETERIZATION (CUSTOM PROCEDURE TRAY) ×1 IMPLANT
TRANSDUCER W/STOPCOCK (MISCELLANEOUS) ×2 IMPLANT
TUBING ART PRESS 72  MALE/FEM (TUBING) ×1
TUBING ART PRESS 72 MALE/FEM (TUBING) IMPLANT
WIRE EMERALD 3MM-J .025X260CM (WIRE) IMPLANT

## 2022-10-10 NOTE — Transfer of Care (Signed)
Immediate Anesthesia Transfer of Care Note  Patient: Kathy [name]  Procedure(s) Performed: TRANSESOPHAGEAL ECHOCARDIOGRAM (TEE) CARDIOVERSION  Patient Location: PACU  Anesthesia Type:General  Level of Consciousness: drowsy and patient cooperative  Airway & Oxygen Therapy: Patient Spontanous Breathing  Post-op Assessment: Report given to RN and Post -op Vital signs reviewed and stable  Post vital signs: Reviewed and stable  Last Vitals:  Vitals Value Taken Time  BP 81/55 10/10/22 1220  Temp    Pulse 51 10/10/22 1220  Resp 19 10/10/22 1220  SpO2 92 % 10/10/22 1220  Vitals shown include unvalidated device data.  Last Pain:  Vitals:   10/10/22 1054  TempSrc: Tympanic  PainSc: 0-No pain         Complications: No notable events documented. HOTN treated with neosynephrine, Dr. Ermalene Postin aware

## 2022-10-10 NOTE — Anesthesia Procedure Notes (Signed)
Procedure Name: MAC Date/Time: 10/10/2022 12:02 PM  Performed by: Darletta Moll, CRNAPre-anesthesia Checklist: Patient identified, Emergency Drugs available, Suction available and Patient being monitored Patient Re-evaluated:Patient Re-evaluated prior to induction Oxygen Delivery Method: Supernova nasal CPAP

## 2022-10-10 NOTE — CV Procedure (Signed)
   TRANSESOPHAGEAL ECHOCARDIOGRAM GUIDED DIRECT CURRENT CARDIOVERSION  NAME:  Kathy [name]    MRN: [id number] DOB:  Apr 01, 1951    ADMIT DATE: 10/10/2022  INDICATIONS: Symptomatic atrial fibrillation  PROCEDURE:   Informed consent was obtained prior to the procedure. The risks, benefits and alternatives for the procedure were discussed and the patient comprehended these risks.  Risks include, but are not limited to, cough, sore throat, vomiting, nausea, somnolence, esophageal and stomach trauma or perforation, bleeding, low blood pressure, aspiration, pneumonia, infection, trauma to the teeth and death.    After a procedural time-out, the oropharynx was anesthetized and the patient was sedated by the anesthesia service. The transesophageal probe was inserted in the esophagus and stomach without difficulty and multiple views were obtained. Sedation by anesthesia.   COMPLICATIONS:    Complications: No complications Patient tolerated procedure well.  KEY FINDINGS:  LV: normal function and size.  RV: moderately to severely dilated with mildly reduced function.  Tricuspid valve: severe TR due to poor leaflet coaptation.  Mitral valve: Normal, trace MR.  Pulmonic valve: Normal, trace PI.  LAA: no thrombus.  Full Report to follow.   CARDIOVERSION:     Indications:  Symptomatic Atrial Fibrillation  Procedure Details:  Once the TEE was complete, the patient had the defibrillator pads placed in the anterior and posterior position. Once an appropriate level of sedation was confirmed, the patient was cardioverted x 3 with 200J of biphasic synchronized energy.  The patient converted to NSR.  There were no apparent complications.  The patient had normal neuro status and respiratory status post procedure with vitals stable as recorded elsewhere.  Adequate airway was maintained throughout and vital signs monitored per protocol.  Braylyn Kalter Advanced Heart Failure 12:13 PM

## 2022-10-10 NOTE — Discharge Instructions (Signed)
Electrical Cardioversion °Electrical cardioversion is the delivery of a jolt of electricity to restore a normal rhythm to the heart. A rhythm that is too fast or is not regular keeps the heart from pumping well. In this procedure, sticky patches or metal paddles are placed on the chest to deliver electricity to the heart from a device. °This procedure may be done in an emergency if: °There is low or no blood pressure as a result of the heart rhythm. °Normal rhythm must be restored as fast as possible to protect the brain and heart from further damage. °It may save a life. °This may also be a scheduled procedure for irregular or fast heart rhythms that are not immediately life-threatening. ° °What can I expect after the procedure? °Your blood pressure, heart rate, breathing rate, and blood oxygen level will be monitored until you leave the hospital or clinic. °Your heart rhythm will be watched to make sure it does not change. °You may have some redness on the skin where the shocks were given. Over the counter cortizone cream may be helpful.  °Follow these instructions at home: °Do not drive for 24 hours if you were given a sedative during your procedure. °Take over-the-counter and prescription medicines only as told by your health care provider. °Ask your health care provider how to check your pulse. Check it often. °Rest for 48 hours after the procedure or as told by your health care provider. °Avoid or limit your caffeine use as told by your health care provider. °Keep all follow-up visits as told by your health care provider. This is important. °Contact a health care provider if: °You feel like your heart is beating too quickly or your pulse is not regular. °You have a serious muscle cramp that does not go away. °Get help right away if: °You have discomfort in your chest. °You are dizzy or you feel faint. °You have trouble breathing or you are short of breath. °Your speech is slurred. °You have trouble moving an  arm or leg on one side of your body. °Your fingers or toes turn cold or blue. °Summary °Electrical cardioversion is the delivery of a jolt of electricity to restore a normal rhythm to the heart. °This procedure may be done right away in an emergency or may be a scheduled procedure if the condition is not an emergency. °Generally, this is a safe procedure. °After the procedure, check your pulse often as told by your health care provider. °This information is not intended to replace advice given to you by your health care provider. Make sure you discuss any questions you have with your health care provider. °Document Revised: 04/07/2019 Document Reviewed: 04/07/2019 °Elsevier Patient Education © 2020 [employer]  °

## 2022-10-10 NOTE — Progress Notes (Signed)
  Echocardiogram Echocardiogram Transesophageal has been performed.  Kathy [name] 10/10/2022, 12:21 PM

## 2022-10-10 NOTE — Progress Notes (Signed)
Patient and son was given discharge instructions. Both verbalized understanding.

## 2022-10-11 ENCOUNTER — Encounter (HOSPITAL_COMMUNITY): Payer: Self-pay | Admitting: Cardiology

## 2022-10-12 ENCOUNTER — Telehealth: Payer: Self-pay | Admitting: Student

## 2022-10-12 NOTE — Anesthesia Preprocedure Evaluation (Signed)
Anesthesia Evaluation  Patient identified by MRN, date of birth, ID band Patient awake    Reviewed: Allergy & Precautions, NPO status , Patient's Chart, lab work & pertinent test results  History of Anesthesia Complications Negative for: history of anesthetic complications  Airway Mallampati: III  TM Distance: >3 FB Neck ROM: Full    Dental  (+) Dental Advisory Given   Pulmonary sleep apnea    breath sounds clear to auscultation       Cardiovascular hypertension, +CHF  + dysrhythmias Atrial Fibrillation  Rhythm:Irregular     Neuro/Psych  Headaches PSYCHIATRIC DISORDERS Anxiety Depression     Neuromuscular disease    GI/Hepatic Neg liver ROS, hiatal hernia,GERD  ,,  Endo/Other    Morbid obesity  Renal/GU Renal disease     Musculoskeletal  (+) Arthritis ,  Fibromyalgia -  Abdominal   Peds  Hematology  (+) Blood dyscrasia, anemia   Anesthesia Other Findings   Reproductive/Obstetrics                             Anesthesia Physical Anesthesia Plan  ASA: 3  Anesthesia Plan: General   Post-op Pain Management: Minimal or no pain anticipated   Induction: Intravenous  PONV Risk Score and Plan: 3 and Propofol infusion and Treatment may vary due to age or medical condition  Airway Management Planned: Nasal Cannula, Natural Airway and Simple Face Mask  Additional Equipment: None  Intra-op Plan:   Post-operative Plan:   Informed Consent: I have reviewed the patients History and Physical, chart, labs and discussed the procedure including the risks, benefits and alternatives for the proposed anesthesia with the patient or authorized representative who has indicated his/her understanding and acceptance.     Dental advisory given  Plan Discussed with: CRNA  Anesthesia Plan Comments:        Anesthesia Quick Evaluation

## 2022-10-12 NOTE — Telephone Encounter (Signed)
  Patient called answering service during after hours for concerns about low heart rate.  Reviewed chart.  She was recently referred to Dr. Daniel Nones for further evaluation of diastolic CHF, pulmonary hypertension, and severe TR.  She also has known atrial fibrillation.  She underwent TEE/DCCV and RHC with Dr. Thurmon Fair earlier this week on 10/10/2022.  Cardioversion was successful and she was in sinus bradycardia with rates in the low 50s following the procedure.  She was discharged on Amiodarone and Digoxin and home Toprol-XL was decreased to '50mg'$  daily.  She states she has been feeling more sluggish and weak.  She also describes some nausea and mild dizziness.  She checked her vitals because of this and her heart rate was 44 bpm.  BP was stable at 138/68.  She fears that she may be back in atrial fibrillation because she is having some indigestion which she states she normally has with her A-fib.  She denies any other chest pain.  She did initially sound short of breath on the phone but this was after she was rushing to pick up the phone.  By the end,  she did not sound short of breath.  Sounds like her breathing has otherwise been stable.  She denies any syncope.  I do not think she needs to come to the ED right now. I advised patient to stop her Toprol and Digoxin for now and continue to monitor her BP and HR closely.  She states that she thought she was supposed to have a follow-up visit with Dr. Daniel Nones next week but I do not see anything scheduled.  I do think she should have a close follow-up visit in either Dr. Elmarie Shiley office or Dr. Conception Chancy office so that we can repeat an EKG.  She may need a Digoxin level checked as well.  She states she would prefer to follow-up in Dr. Conception Chancy. Advised patient that she should go to the ED if she develops any significant lightheadedness/dizziness, lethargy, worsening shortness of breath, chest pain, or near syncope/syncope and she voiced understanding and  agreed.  I will route this message to Dr. Daniel Nones as well so that he is aware in case he has any other recommendations. I will also route this message to Kevan Rosebush, RN, to see if she can help arrange follow-up visit.   Darreld Mclean, PA-C 10/12/2022 6:24 PM

## 2022-10-12 NOTE — Anesthesia Postprocedure Evaluation (Signed)
Anesthesia Post Note  Patient: Kathy [name]  Procedure(s) Performed: TRANSESOPHAGEAL ECHOCARDIOGRAM (TEE) CARDIOVERSION     Patient location during evaluation: Endoscopy Anesthesia Type: General Level of consciousness: awake and patient cooperative Pain management: pain level controlled Vital Signs Assessment: post-procedure vital signs reviewed and stable Respiratory status: spontaneous breathing, nonlabored ventilation and respiratory function stable Cardiovascular status: stable Postop Assessment: no apparent nausea or vomiting Anesthetic complications: no   No notable events documented.  Last Vitals:  Vitals:   10/10/22 1415 10/10/22 1430  BP: 103/63 114/70  Pulse: 60 61  Resp:    Temp:    SpO2: 93% 96%    Last Pain:  Vitals:   10/10/22 1326  TempSrc:   PainSc: 0-No pain                 Misha Vanoverbeke

## 2022-10-13 ENCOUNTER — Emergency Department (HOSPITAL_COMMUNITY): Payer: Medicare Other

## 2022-10-13 ENCOUNTER — Other Ambulatory Visit: Payer: Self-pay

## 2022-10-13 ENCOUNTER — Encounter (HOSPITAL_COMMUNITY): Payer: Self-pay

## 2022-10-13 ENCOUNTER — Emergency Department (HOSPITAL_COMMUNITY)
Admission: EM | Admit: 2022-10-13 | Discharge: 2022-10-14 | Disposition: A | Discharge status: Home / Self Care | Payer: Medicare Other | Attending: Emergency Medicine | Admitting: Emergency Medicine

## 2022-10-13 ENCOUNTER — Telehealth (HOSPITAL_COMMUNITY): Payer: Self-pay

## 2022-10-13 ENCOUNTER — Telehealth: Payer: Self-pay | Admitting: Cardiovascular Disease

## 2022-10-13 ENCOUNTER — Telehealth (HOSPITAL_COMMUNITY): Payer: Self-pay | Admitting: Vascular Surgery

## 2022-10-13 DIAGNOSIS — Z7901 Long term (current) use of anticoagulants: Secondary | ICD-10-CM | POA: Diagnosis not present

## 2022-10-13 DIAGNOSIS — I509 Heart failure, unspecified: Secondary | ICD-10-CM | POA: Insufficient documentation

## 2022-10-13 DIAGNOSIS — R001 Bradycardia, unspecified: Secondary | ICD-10-CM

## 2022-10-13 LAB — TROPONIN I (HIGH SENSITIVITY): Troponin I (High Sensitivity): 6 ng/L (ref <18)

## 2022-10-13 LAB — MAGNESIUM: Magnesium: 1.8 mg/dL (ref 1.7–2.4)

## 2022-10-13 LAB — TSH: TSH: 1.993 u[IU]/mL (ref 0.350–4.500)

## 2022-10-13 LAB — COMPREHENSIVE METABOLIC PANEL
ALT: 15 U/L (ref 0–44)
AST: 24 U/L (ref 15–41)
Albumin: 3.9 g/dL (ref 3.5–5.0)
Alkaline Phosphatase: 81 U/L (ref 38–126)
Anion gap: 12 (ref 5–15)
BUN: 30 mg/dL — ABNORMAL HIGH (ref 8–23)
CO2: 26 mmol/L (ref 22–32)
Calcium: 9.1 mg/dL (ref 8.9–10.3)
Chloride: 99 mmol/L (ref 98–111)
Creatinine, Ser: 1.32 mg/dL — ABNORMAL HIGH (ref 0.44–1.00)
GFR, Estimated: 43 mL/min — ABNORMAL LOW (ref >60)
Glucose, Bld: 98 mg/dL (ref 70–99)
Potassium: 3.9 mmol/L (ref 3.5–5.1)
Sodium: 137 mmol/L (ref 135–145)
Total Bilirubin: 0.8 mg/dL (ref 0.3–1.2)
Total Protein: 6.6 g/dL (ref 6.5–8.1)

## 2022-10-13 LAB — CBC
HCT: 41.6 % (ref 36.0–46.0)
Hemoglobin: 12.8 g/dL (ref 12.0–15.0)
MCH: 27.9 pg (ref 26.0–34.0)
MCHC: 30.8 g/dL (ref 30.0–36.0)
MCV: 90.8 fL (ref 80.0–100.0)
Platelets: 235 10*3/uL (ref 150–400)
RBC: 4.58 MIL/uL (ref 3.87–5.11)
RDW: 14.8 % (ref 11.5–15.5)
WBC: 7.5 10*3/uL (ref 4.0–10.5)
nRBC: 0 % (ref 0.0–0.2)

## 2022-10-13 LAB — BRAIN NATRIURETIC PEPTIDE: B Natriuretic Peptide: 534.5 pg/mL — ABNORMAL HIGH (ref 0.0–100.0)

## 2022-10-13 LAB — DIGOXIN LEVEL: Digoxin Level: 0.3 ng/mL — ABNORMAL LOW (ref 0.8–2.0)

## 2022-10-13 NOTE — Telephone Encounter (Signed)
STAT if HR is under 50 or over 120 (normal HR is 60-100 beats per minute)  What is your heart rate? 44 a minutes ago   Do you have a log of your heart rate readings (document readings)? This morning first woke up 154/77 HR 46, O2 84, HR at 10:40am was 53, at 11:45am HR was 45, BP 126/64  Do you have any other symptoms? Breathless when speaking, not ssevere but noticeable  Lee with [facility] states the patient called in yesterday and was told to discontinue her digoxin and metoprolol. She says the patient still has a low HR and she sounds breathless when she speaks. She says the patient has gained 2 pounds since she came home and her O2 was 89-92. Call disconnected before a nurse could be reached. Phone:[phone]

## 2022-10-13 NOTE — Telephone Encounter (Signed)
Kathy [name] with home health called and stated patient was feeling a little worse today with symptoms and she was concerned with the weekend coming up and wanted to know what we can do. I read the note from her cardiologist who stopped her Toprol and Digoxin and recommended she go to the ED if symptoms worsen. I left a message for the nurse to call back, but indicated what the cardiologist message stated about the ED as well.

## 2022-10-13 NOTE — ED Triage Notes (Signed)
Pt brought in by EMS from home for bradycardia. On 10/10/22 pt had a scheduled cardioversion and was sent home on metoprolol and digoxin. Pt has been having issues with bradycardia since yesterday afternoon, was instructed to take half of her prescribed doses but is still having issues today and was then told by her cardiologist to come to the ED. Last took metoprolol at 1030 on 10/13/23 and digoxin on 10/11/22 at 2100. Denies syncope but has been feeling 'off' and light headed.

## 2022-10-13 NOTE — ED Provider Notes (Signed)
[facility] Provider Note   CSN: [id number] Arrival date & time: 10/13/22  1655     History {Add pertinent medical, surgical, social history, OB history to HPI:1} Chief Complaint  Patient presents with   Bradycardia    Kathy [name] is a 72 y.o. female.  72 year old female with a history of atrial fibrillation status post cardioversion on 10/10/2022, CHF, pulmonary hypertension, tricuspid regurgitation who presents to the emergency department with bradycardia and shortness of breath.  Patient reports that several days ago she started feeling that she was more winded than usual and dizzy.  Says that she noticed that her heart rate was also low in the 40s.  Called her cardiologist regarding this and they cut her metoprolol from 100 mg to 50 mg on Wednesday.  Also discontinued her digoxin last night given her persistent symptoms.  Reports that she is also on amiodarone as well.  Called her primary cardiologist Dr. Cathie Olden who referred her into the emergency department.  Says that she is also having intermittent chest pains in her anterior chest.  Describes it as sharp and fleeting.  Denies any lower extremity swelling.  No significant cough or other symptoms recently.       Home Medications Prior to Admission medications   Medication Sig Start Date End Date Taking? Authorizing Provider  ALPRAZolam [name]) 1 MG tablet TAKE 1 TABLET BY MOUTH THREE TIMES A DAY AS NEEDED FOR ANXIETY 09/12/22   Laurey Morale, MD  amiodarone (PACERONE) 200 MG tablet Take 1 tablet (200 mg total) by mouth 2 (two) times daily. Take '400mg'$  BID for 7 days and then '200mg'$  BID 10/10/22 01/08/23  Sabharwal, Aditya, DO  apixaban (ELIQUIS) 5 MG TABS tablet Take 1 tablet (5 mg total) by mouth 2 (two) times daily. 05/09/22   Isaac Bliss, Rayford Halsted, MD  atorvastatin (LIPITOR) 40 MG tablet Take 1 tablet (40 mg total) by mouth daily. Patient taking differently: Take 40 mg by  mouth daily with supper. 12/05/21   Nahser, Wonda Cheng, MD  Calcium Carb-Cholecalciferol (CALCIUM 600 + D PO) Take 1 tablet by mouth daily.    [provider]  clindamycin (CLEOCIN T) 1 % lotion Apply 1 application  topically 2 (two) times daily as needed (rosacea).    [provider]  clobetasol (TEMOVATE) 0.05 % external solution Apply 1 application topically daily as needed (rosacea).    [provider]  cyclobenzaprine (FLEXERIL) 5 MG tablet Take 5 mg by mouth at bedtime as needed for muscle spasms.    [provider]  digoxin (LANOXIN) 0.125 MG tablet Take 0.5 tablets (0.0625 mg total) by mouth daily. 10/10/22   Sabharwal, Aditya, DO  ENTRESTO 24-26 MG TAKE 1 TABLET BY MOUTH TWICE A DAY 09/27/22   Isaac Bliss, Rayford Halsted, MD  fluocinonide (LIDEX) 0.05 % external solution Apply 1 application topically daily as needed (rosacea).    [provider]  FLUoxetine (PROZAC) 40 MG capsule TAKE 1 CAPSULE BY MOUTH EVERY DAY 10/05/22   Isaac Bliss, Rayford Halsted, MD  furosemide (LASIX) 40 MG tablet Take 1 tablet (40 mg total) by mouth daily. 07/30/22   Alma Friendly, MD  gabapentin (NEURONTIN) 300 MG capsule Take 300 mg by mouth at bedtime.    [provider]  HYDROcodone-acetaminophen (NORCO/VICODIN) 5-325 MG tablet Take 1 tablet by mouth every 6 (six) hours as needed for moderate pain. 06/06/22   Isaac Bliss, Rayford Halsted, MD  methocarbamol (ROBAXIN) 500  MG tablet Take 500 mg by mouth every 8 (eight) hours as needed. 05/26/22   [provider]  metoprolol succinate (TOPROL-XL) 100 MG 24 hr tablet Take 0.5 tablets (50 mg total) by mouth daily. Take with or immediately following a meal. 10/10/22   Sabharwal, Aditya, DO  metroNIDAZOLE (METROCREAM) 0.75 % cream Apply 1 application topically 2 (two) times daily as needed (rosacea).     [provider]  Multiple Vitamin (MULTIVITAMIN WITH MINERALS) TABS tablet Take 1 tablet by mouth daily.  Centrum Silver    [provider]  nitroGLYCERIN (NITROSTAT) 0.4 MG SL tablet Place 1 tablet (0.4 mg total) under the tongue every 5 (five) minutes as needed for chest pain. 08/30/22 11/28/22  Marylu Lund., NP  omeprazole (PRILOSEC) 40 MG capsule TAKE 1 CAPSULE BY MOUTH EVERY DAY 08/28/22   Isaac Bliss, Rayford Halsted, MD  potassium chloride (KLOR-CON) 10 MEQ tablet Take 1 tablet (10 mEq total) by mouth daily. 12/05/21   Nahser, Wonda Cheng, MD      Allergies    Achromycin [tetracycline hcl], Penicillins, Sulfamethoxazole-trimethoprim, Nickel, and Zanaflex [tizanidine hydrochloride]    Review of Systems   Review of Systems  Physical Exam Updated Vital Signs BP 135/71   Pulse (!) 53   Temp 98.4 F (36.9 C) (Oral)   Resp 18   Ht 5' 6.5" (1.689 m)   Wt 129.7 kg   SpO2 100%   BMI 45.47 kg/m  Physical Exam Vitals and nursing note reviewed.  Constitutional:      General: She is not in acute distress.    Appearance: She is well-developed.  HENT:     Head: Normocephalic and atraumatic.     Right Ear: External ear normal.     Left Ear: External ear normal.     Nose: Nose normal.  Eyes:     Extraocular Movements: Extraocular movements intact.     Conjunctiva/sclera: Conjunctivae normal.     Pupils: Pupils are equal, round, and reactive to light.  Cardiovascular:     Rate and Rhythm: Regular rhythm. Bradycardia present.     Heart sounds: No murmur heard. Pulmonary:     Effort: Pulmonary effort is normal. No respiratory distress.     Breath sounds: Normal breath sounds.  Musculoskeletal:     Cervical back: Normal range of motion and neck supple.     Right lower leg: No edema.     Left lower leg: No edema.  Skin:    General: Skin is warm and dry.  Neurological:     Mental Status: She is alert and oriented to person, place, and time. Mental status is at baseline.  Psychiatric:        Mood and Affect: Mood normal.     ED Results / Procedures / Treatments    Labs (all labs ordered are listed, but only abnormal results are displayed) Labs Reviewed  CBC  DIGOXIN LEVEL  TSH  COMPREHENSIVE METABOLIC PANEL  MAGNESIUM    EKG None  Radiology No results found.  Procedures Procedures  {Document cardiac monitor, telemetry assessment procedure when appropriate:1}  Medications Ordered in ED Medications - No data to display  ED Course/ Medical Decision Making/ A&P   {   Click here for ABCD2, HEART and other calculatorsREFRESH Note before signing :1}                          Medical Decision Making Amount and/or Complexity of Data  Reviewed Labs: ordered. Radiology: ordered.   ***  {Document critical care time when appropriate:1} {Document review of labs and clinical decision tools ie heart score, Chads2Vasc2 etc:1}  {Document your independent review of radiology images, and any outside records:1} {Document your discussion with family members, caretakers, and with consultants:1} {Document social determinants of health affecting pt's care:1} {Document your decision making why or why not admission, treatments were needed:1} Final Clinical Impression(s) / ED Diagnoses Final diagnoses:  None    Rx / DC Orders ED Discharge Orders     None

## 2022-10-13 NOTE — ED Notes (Signed)
Patient ambulated to bathroom. This RN followed behind.

## 2022-10-13 NOTE — Telephone Encounter (Signed)
Returned home health nurse phone call however, the voicemail box was full. Spoke to the patient she was symptomatic on 10/12/22 and was advised  by Sande Rives, PA  to hold her digoxin and metoprolol. Also if develop any new or worsening symptoms to call 911 or go to the nearest emergency room. Patient stated she did call 911 last night but decided not to go to the hospital due to there was going to be a long wait to be seen. As of today, patient she has gained 2 pounds since 10/10/22 her vitals as of this morning 154/79 , pulse 45. Around 1145 her vitals were 126/54, 45 pulse and her oxygen level was in the 80's. Advised the patient to call 911, the patient stated that the PA told her that was not necessary. I explained to the patient that since she is symptomatic and her symptoms are  worsening that it was advised to call 911. Then patient asked the best phone number for the home health nurse to contact the clinic, I did provide the phone number and once again explained the importance of calling 911 immediately. Patient voiced understanding. Will forward this to the MD and the his  nurse.

## 2022-10-13 NOTE — Telephone Encounter (Signed)
Lvm to make urgent appt w/ Adi 1/30 @ 1140

## 2022-10-13 NOTE — ED Notes (Signed)
Patient transported to X-ray 

## 2022-10-13 NOTE — ED Notes (Signed)
Patient returned from X-ray 

## 2022-10-14 NOTE — Discharge Instructions (Signed)
You were seen for your low heart rate in the emergency department.   At home, please stop taking the digoxin and metoprolol since these may be making your heart rate too low.    Follow-up with your primary doctor in 2-3 days regarding your visit.  Follow-up with your cardiologist as soon as possible regarding your visit.  Return immediately to the emergency department if you experience any of the following: Worsening dizziness, fainting, chest pain, or any other concerning symptoms.    Thank you for visiting our Emergency Department. It was a pleasure taking care of you today.

## 2022-10-16 NOTE — Telephone Encounter (Signed)
Nahser, Kathy Cheng, MD  [name] K Caller: Unspecified (3 days ago,  3:00 PM) Agree with the plan to hold the meds listed in previous notes, She needs an appt with me, an APP or DOD soon  if she remains symptomatic I have openings in several weeks  PN  Returned call to patient who states that her HR yesterday came up to 52bpm and today was in the 60's and has stayed there. States she feels much better and has an appt with Dr Carolin Guernsey tomorrow. Will call us back if she needs to move up her appt.

## 2022-10-16 NOTE — Telephone Encounter (Signed)
2 nd attempt reach pt to make urgent new chf appt w/ [facility] this week

## 2022-10-17 ENCOUNTER — Ambulatory Visit (HOSPITAL_COMMUNITY)
Admission: RE | Admit: 2022-10-17 | Discharge: 2022-10-17 | Disposition: A | Discharge status: Home / Self Care | Payer: Medicare Other | Source: Ambulatory Visit | Attending: Cardiology | Admitting: Cardiology

## 2022-10-17 ENCOUNTER — Encounter (HOSPITAL_COMMUNITY): Payer: Self-pay | Admitting: Cardiology

## 2022-10-17 VITALS — BP 140/80 | HR 56 | Wt 288.4 lb

## 2022-10-17 DIAGNOSIS — G4733 Obstructive sleep apnea (adult) (pediatric): Secondary | ICD-10-CM

## 2022-10-17 DIAGNOSIS — I50810 Right heart failure, unspecified: Secondary | ICD-10-CM | POA: Diagnosis not present

## 2022-10-17 DIAGNOSIS — I4819 Other persistent atrial fibrillation: Secondary | ICD-10-CM | POA: Diagnosis not present

## 2022-10-17 DIAGNOSIS — I5032 Chronic diastolic (congestive) heart failure: Secondary | ICD-10-CM | POA: Diagnosis present

## 2022-10-17 MED ORDER — AMIODARONE HCL 200 MG PO TABS: 200.0000 mg | ORAL_TABLET | Freq: Two times a day (BID) | ORAL | 3 refills | Status: DC

## 2022-10-17 NOTE — Progress Notes (Signed)
ADVANCED HEART FAILURE CLINIC NOTE  Referring Physician: Isaac Bliss, [facility]*  Primary Care: Isaac Bliss, Rayford Halsted, MD Primary Cardiologist: Dr. Debara Pickett   HPI: Kathy [name] is a 72 y.o. female with HFpEF, paroxysmal atrial fibrillation, OSA (CPAP), hyperlipidemia and HTN presenting today for evaluation. Kathy [name] cardiac history dates back to 2019 when she presented to the ER  w/ complaints of dyspnea. She was found to have atrial fibrillation  w/ TTE demonstrating preserved LV function and severe LVH. She converted to NSR with metoprolol. Since that time she has also underwent LHC w/ nonobstructive CAD. Over the past 2 years she has continued to struggle with symptomatic paroxysmal atrial fibrillation. In 11/23 she was admitted with acute on chronic HFpEF; TTE at that time with dilated RV w/ moderately reduced systolic function. She was diuresed and discharged home. Since that time she has her functional status remains fairly limited.  She can perform all ADLs independently but frequently becomes very fatigued and short of breath after walking over 30 to 40 feet.  No PND and currently lower extremity edema is fairly well-controlled however she does quickly become hypervolemic with any dietary indiscretion.  Interval hx:  Since our last visit, Kathy [name] has undergone RHC confirming RV dysfunction with reduced cardiac output/index likely secondary to long term OSA and severe TR. In addition, she has had afib for a prolonged period; underwent TEE/DCCV with conversion to sinus rhythm. Unfortunately, afterwards patient became bradycardic and came to the ER where digoxin was discontinued. Since that time she feels relatively stable; NYHA IIB-III; SOB is unchanged for prior.   Activity level/exercise tolerance: NYHA IIB-III Orthopnea:  Sleeps on 2-3 pillows Paroxysmal noctural dyspnea: Infrequent Chest pain/pressure: No Orthostatic lightheadedness: No Palpitations: No Lower extremity  edema: Yes, 1-2+ Presyncope/syncope: No Cough: No  Past Medical History:  Diagnosis Date   Adenomatous colon polyp    2012   Anxiety and depression    Arthritis    Osteoarthritis, Dr Maureen Ralphs   Cancer [facility])    skin - left foot excised   Chronic diastolic CHF (congestive heart failure) (HCC)    DDD (degenerative disc disease), cervical    Deaf, right    DVT (deep venous thrombosis) ([place])    RLE DVT 08/2004, post right knee arthroscopy   Fibromyalgia    Dr Tobie Lords, Rheu   GERD (gastroesophageal reflux disease)    H/O hiatal hernia    Headache(784.0)    PAST HX MILD MIGRAINES - resolved per patient 08/18/20   History of kidney stones    passed stones   HTN (hypertension)    IBS (irritable bowel syndrome)    Internal hemorrhoid    Lumbar radiculopathy    Obesity    OSA on CPAP    Moderate with AHI 20/hr with nocturnal hypoxemia on CPAP   PAF (paroxysmal atrial fibrillation) ([place]) 06/2018   Pulmonary hypertension (HCC)     Current Outpatient Medications  Medication Sig Dispense Refill   ALPRAZolam (XANAX) 1 MG tablet TAKE 1 TABLET BY MOUTH THREE TIMES A DAY AS NEEDED FOR ANXIETY 60 tablet 0   amiodarone (PACERONE) 200 MG tablet Take 1 tablet (200 mg total) by mouth 2 (two) times daily. Take '400mg'$  BID for 7 days and then '200mg'$  BID 60 tablet 2   apixaban (ELIQUIS) 5 MG TABS tablet Take 1 tablet (5 mg total) by mouth 2 (two) times daily. 56 tablet 0   atorvastatin (LIPITOR) 40 MG tablet Take 1 tablet (40 mg total)  by mouth daily. 90 tablet 3   Calcium Carb-Cholecalciferol (CALCIUM 600 + D PO) Take 1 tablet by mouth daily.     clindamycin (CLEOCIN T) 1 % lotion Apply 1 application  topically 2 (two) times daily as needed (rosacea).     clobetasol (TEMOVATE) 0.05 % external solution Apply 1 application topically daily as needed (rosacea).     cyclobenzaprine (FLEXERIL) 5 MG tablet Take 5 mg by mouth at bedtime as needed for muscle spasms.     ENTRESTO 24-26 MG TAKE 1 TABLET  BY MOUTH TWICE A DAY 60 tablet 0   fluocinonide (LIDEX) 0.05 % external solution Apply 1 application topically daily as needed (rosacea).     FLUoxetine (PROZAC) 40 MG capsule TAKE 1 CAPSULE BY MOUTH EVERY DAY 90 capsule 0   furosemide (LASIX) 40 MG tablet Take 1 tablet (40 mg total) by mouth daily. 90 tablet 1   gabapentin (NEURONTIN) 300 MG capsule Take 300 mg by mouth at bedtime.     HYDROcodone-acetaminophen (NORCO/VICODIN) 5-325 MG tablet Take 1 tablet by mouth every 6 (six) hours as needed for moderate pain. 30 tablet 0   methocarbamol (ROBAXIN) 500 MG tablet Take 500 mg by mouth every 8 (eight) hours as needed.     metroNIDAZOLE (METROCREAM) 0.75 % cream Apply 1 application topically 2 (two) times daily as needed (rosacea).      Multiple Vitamin (MULTIVITAMIN WITH MINERALS) TABS tablet Take 1 tablet by mouth daily. Centrum Silver     nitroGLYCERIN (NITROSTAT) 0.4 MG SL tablet Place 1 tablet (0.4 mg total) under the tongue every 5 (five) minutes as needed for chest pain. 90 tablet 3   omeprazole (PRILOSEC) 40 MG capsule TAKE 1 CAPSULE BY MOUTH EVERY DAY 90 capsule 1   potassium chloride (KLOR-CON) 10 MEQ tablet Take 1 tablet (10 mEq total) by mouth daily. 90 tablet 3   No current facility-administered medications for this encounter.    Allergies  Allergen Reactions   Achromycin [Tetracycline Hcl] Shortness Of Breath    Dyspnea     Penicillins     Facial angioedema Did it involve swelling of the face/tongue/throat, SOB, or low BP? Yes Did it involve sudden or severe rash/hives, skin peeling, or any reaction on the inside of your mouth or nose? No Did you need to seek medical attention at a hospital or doctor's office? No When did it last happen?      30+ years If all above answers are "NO", may proceed with cephalosporin use.    Sulfamethoxazole-Trimethoprim Hives   Nickel Itching   Zanaflex [Tizanidine Hydrochloride]     Causes more pain       Social History    Socioeconomic History   Marital status: Widowed    Spouse name: Not on file   Number of children: 2   Years of education: Not on file   Highest education level: Not on file  Occupational History   Occupation: retired  Tobacco Use   Smoking status: Never   Smokeless tobacco: Never  Vaping Use   Vaping Use: Never used  Substance and Sexual Activity   Alcohol use: Yes    Comment: occasional   Drug use: No   Sexual activity: Not on file    Comment: Hysterectomy  Other Topics Concern   Not on file  Social History Narrative   0 caffeine drinks    Social Determinants of Health   Financial Resource Strain: Not on file  Food Insecurity: No Food Insecurity (07/26/2022)  Hunger Vital Sign    Worried About Running Out of Food in the Last Year: Never true    Ran Out of Food in the Last Year: Never true  Transportation Needs: No Transportation Needs (07/26/2022)   PRAPARE - Hydrologist (Medical): No    Lack of Transportation (Non-Medical): No  Physical Activity: Not on file  Stress: Not on file  Social Connections: Not on file  Intimate Partner Violence: Not At Risk (07/26/2022)   Humiliation, Afraid, Rape, and Kick questionnaire    Fear of Current or Ex-Partner: No    Emotionally Abused: No    Physically Abused: No    Sexually Abused: No      Family History  Problem Relation Age of Onset   Heart attack Mother 70   COPD Mother    Breast cancer Mother 29   Hypertension Father    Diabetes Father    Heart failure Father 47   Breast cancer Paternal Aunt        cns mets   COPD Maternal Grandmother    Heart disease Paternal Grandmother        died 26   Diabetes Paternal Grandmother    Stroke Paternal Grandfather        in late 68s   Heart attack Brother 19   Colon cancer Neg Hx     PHYSICAL EXAM: Vitals:   10/17/22 1156  BP: (!) 140/80  Pulse: (!) 56  SpO2: 97%   GENERAL: Well nourished, well developed, and in no apparent distress  at rest.  HEENT: Negative for arcus senilis or xanthelasma. There is no scleral icterus.  The mucous membranes are pink and moist.   NECK: Supple, No masses. Normal carotid upstrokes without bruits. No masses or thyromegaly.    CHEST: There are no chest wall deformities. There is no chest wall tenderness. Respirations are unlabored.  Lungs-CTA bilaterally CARDIAC: Thick neck, difficult to assess         Normal S1, S2  Normal rate with regular rhythm. No murmurs, rubs or gallops.  Pulses are 2+ and symmetrical in upper and lower extremities.  1+ edema ABDOMEN: Soft, non-tender, non-distended. There are no masses or hepatomegaly. There are normal bowel sounds.  EXTREMITIES: Warm and well perfused with no cyanosis, clubbing.  LYMPHATIC: No axillary or supraclavicular lymphadenopathy.  NEUROLOGIC: Patient is oriented x3 with no focal or lateralizing neurologic deficits.  PSYCH: Patients affect is appropriate, there is no evidence of anxiety or depression.  SKIN: Warm and dry; no lesions or wounds.   DATA REVIEW  ECG: Atrial fibrillation (09/22/2022)  ECHO: 07/27/22: LVEF 50-55%, RV is dilated with mild to moderately reduced systolic function. RA is severely dilated. There is severe eccentric TR.    CATH: 06/26/18:  Ost RCA lesion is 30% stenosed. The left ventricular systolic function is normal. LV end diastolic pressure is mildly elevated. The left ventricular ejection fraction is 55-65% by visual estimate.   10/10/22: RA:                  13 mmHg (mean) RV:                  41/10-15 mmHg PA:                  40/15 mmHg (21 mean) PCWP:            14 mmHg (mean)  Estimated Fick CO/CI   4.39 L/min, 1.9 L/min/m2 Thermodilution CO/CI   3.61 L/min, 1.5 L/min/m2                                            TPG                 7  mmHg                                              PVR                 1.6-1.94 Wood Units  PAPi                1.9        IMPRESSION: Severely elevated pre-capillary pressures.  Normal left sided filling pressures Normal PVR and PA mean.  Severely reduced cardiac index likely secondary to suboptimal RV function. (HFW:YOVZ of 1, PAPi of 1.9. )   ASSESSMENT & PLAN:  RV Failure/Severe TR - Echocardiogram's dating back to 2019 demonstrate severe TR. On her initial TTE in 2019 she has severe TR with mildly dilated RV and preserved LV function. Over the past 3 years, echocardiograms demonstrate gradual progression of TR and worsening of RV function. Uncertain if this is primary TR or a sequale of underlying pulmonary hypertension.  - RHC/TEE on 10/10/22 confirming RV failure with severe TR. Likely has had prolonged atrial fibrillation and OSA. Will plan for CMR for further evaluation.  - Now S/P TEE; will re-assess her functional status while in sinus over the next several months; if it does not improve plan for palliative inotropes.  - Will consider triclip in the future  2. Heart failure w/ preserved EF  - NYHA II-III symptoms; most recent TTE w/ indeterminate diastolic function; tissue doppler not consistent with significant diastolic dysfunction. Will continue lasix '40mg'$  daily for the time being, Entresto 24/'26mg'$  BID.   3. Atrial fibrillation  - Apixaban '5mg'$   - Now s/p TEE/DCCV - Amiodarone '200mg'$  BID.   4. OSA  - CPAP   Genieve Ramaswamy Advanced Heart Failure Mechanical Circulatory Support

## 2022-10-17 NOTE — Patient Instructions (Signed)
DECREASE Amiodarone to 200 mg Twice daily  Your physician recommends that you schedule a follow-up appointment in: 3 weeks.  If you have any questions or concerns before your next appointment please send [name] a message through [address] or call our office at [phone].    TO LEAVE A MESSAGE FOR THE NURSE SELECT OPTION 2, PLEASE LEAVE A MESSAGE INCLUDING: YOUR NAME DATE OF BIRTH CALL BACK NUMBER REASON FOR CALL**this is important as we prioritize the call backs  YOU WILL RECEIVE A CALL BACK THE SAME DAY AS LONG AS YOU CALL BEFORE 4:00 PM  At the [facility], you and your health needs are our priority. As part of our continuing mission to provide you with exceptional heart care, we have created designated Provider Care Teams. These Care Teams include your primary Cardiologist (physician) and Advanced Practice Providers (APPs- Physician Assistants and Nurse Practitioners) who all work together to provide you with the care you need, when you need it.   You may see any of the following providers on your designated Care Team at your next follow up: Dr Glori Bickers Dr Loralie Champagne Dr. Roxana Hires, NP Lyda Jester, [place] [facility] [place], [place] Forestine Na, NP Audry Riles, PharmD   Please be sure to bring in all your medications bottles to every appointment.    Thank you for choosing [facility]

## 2022-10-23 ENCOUNTER — Other Ambulatory Visit: Payer: Self-pay | Admitting: Family Medicine

## 2022-10-23 ENCOUNTER — Telehealth (HOSPITAL_COMMUNITY): Payer: Self-pay | Admitting: Cardiology

## 2022-10-23 DIAGNOSIS — F419 Anxiety disorder, unspecified: Secondary | ICD-10-CM

## 2022-10-23 DIAGNOSIS — I5032 Chronic diastolic (congestive) heart failure: Secondary | ICD-10-CM

## 2022-10-23 LAB — ECHO TEE

## 2022-10-23 NOTE — Telephone Encounter (Signed)
Cardiac rehab referral- never head anything about starting therapy Would like to know when to restart digoxin       Advised would look into and return call

## 2022-10-25 ENCOUNTER — Other Ambulatory Visit (HOSPITAL_COMMUNITY): Payer: Self-pay | Admitting: Cardiology

## 2022-10-25 DIAGNOSIS — I5032 Chronic diastolic (congestive) heart failure: Secondary | ICD-10-CM

## 2022-10-25 NOTE — Telephone Encounter (Signed)
Pt aware and referral placed.  

## 2022-10-27 ENCOUNTER — Other Ambulatory Visit: Payer: Self-pay | Admitting: Internal Medicine

## 2022-11-06 ENCOUNTER — Other Ambulatory Visit: Payer: Self-pay | Admitting: Cardiovascular Disease

## 2022-11-06 DIAGNOSIS — E785 Hyperlipidemia, unspecified: Secondary | ICD-10-CM

## 2022-11-07 ENCOUNTER — Ambulatory Visit: Payer: Medicare Other | Admitting: Cardiovascular Disease

## 2022-11-13 ENCOUNTER — Encounter: Payer: Medicare Other | Admitting: Internal Medicine

## 2022-11-15 ENCOUNTER — Encounter: Payer: Self-pay | Admitting: Internal Medicine

## 2022-11-15 ENCOUNTER — Other Ambulatory Visit: Payer: Self-pay | Admitting: Internal Medicine

## 2022-11-15 ENCOUNTER — Ambulatory Visit (INDEPENDENT_AMBULATORY_CARE_PROVIDER_SITE_OTHER): Payer: Medicare Other | Admitting: Internal Medicine

## 2022-11-15 VITALS — BP 108/50 | HR 65 | Temp 98.1°F | Ht 66.0 in | Wt 292.2 lb

## 2022-11-15 DIAGNOSIS — I10 Essential (primary) hypertension: Secondary | ICD-10-CM | POA: Diagnosis not present

## 2022-11-15 DIAGNOSIS — E559 Vitamin D deficiency, unspecified: Secondary | ICD-10-CM | POA: Insufficient documentation

## 2022-11-15 DIAGNOSIS — Z0000 Encounter for general adult medical examination without abnormal findings: Secondary | ICD-10-CM | POA: Diagnosis not present

## 2022-11-15 DIAGNOSIS — Z23 Encounter for immunization: Secondary | ICD-10-CM

## 2022-11-15 DIAGNOSIS — R5383 Other fatigue: Secondary | ICD-10-CM

## 2022-11-15 DIAGNOSIS — G4733 Obstructive sleep apnea (adult) (pediatric): Secondary | ICD-10-CM

## 2022-11-15 DIAGNOSIS — I5032 Chronic diastolic (congestive) heart failure: Secondary | ICD-10-CM

## 2022-11-15 DIAGNOSIS — Z13820 Encounter for screening for osteoporosis: Secondary | ICD-10-CM

## 2022-11-15 DIAGNOSIS — N1831 Chronic kidney disease, stage 3a: Secondary | ICD-10-CM

## 2022-11-15 DIAGNOSIS — I480 Paroxysmal atrial fibrillation: Secondary | ICD-10-CM

## 2022-11-15 DIAGNOSIS — M797 Fibromyalgia: Secondary | ICD-10-CM

## 2022-11-15 LAB — HEMOGLOBIN A1C: Hgb A1c MFr Bld: 5.7 % (ref 4.6–6.5)

## 2022-11-15 LAB — LIPID PANEL
Cholesterol: 177 mg/dL (ref 0–200)
HDL: 67.2 mg/dL (ref >39.00)
LDL Cholesterol: 90 mg/dL (ref 0–99)
NonHDL: 109.39
Total CHOL/HDL Ratio: 3
Triglycerides: 97 mg/dL (ref 0.0–149.0)
VLDL: 19.4 mg/dL (ref 0.0–40.0)

## 2022-11-15 LAB — COMPREHENSIVE METABOLIC PANEL
ALT: 16 U/L (ref 0–35)
AST: 21 U/L (ref 0–37)
Albumin: 3.9 g/dL (ref 3.5–5.2)
Alkaline Phosphatase: 94 U/L (ref 39–117)
BUN: 25 mg/dL — ABNORMAL HIGH (ref 6–23)
CO2: 31 mEq/L (ref 19–32)
Calcium: 9.7 mg/dL (ref 8.4–10.5)
Chloride: 98 mEq/L (ref 96–112)
Creatinine, Ser: 1.18 mg/dL (ref 0.40–1.20)
GFR: 46.66 mL/min — ABNORMAL LOW (ref >60.00)
Glucose, Bld: 92 mg/dL (ref 70–99)
Potassium: 4 mEq/L (ref 3.5–5.1)
Sodium: 138 mEq/L (ref 135–145)
Total Bilirubin: 0.7 mg/dL (ref 0.2–1.2)
Total Protein: 6.6 g/dL (ref 6.0–8.3)

## 2022-11-15 LAB — CBC WITH DIFFERENTIAL/PLATELET
Basophils Absolute: 0 10*3/uL (ref 0.0–0.1)
Basophils Relative: 0.2 % (ref 0.0–3.0)
Eosinophils Absolute: 0.1 10*3/uL (ref 0.0–0.7)
Eosinophils Relative: 2 % (ref 0.0–5.0)
HCT: 38.7 % (ref 36.0–46.0)
Hemoglobin: 12.6 g/dL (ref 12.0–15.0)
Lymphocytes Relative: 22 % (ref 12.0–46.0)
Lymphs Abs: 1.2 10*3/uL (ref 0.7–4.0)
MCHC: 32.6 g/dL (ref 30.0–36.0)
MCV: 86.5 fl (ref 78.0–100.0)
Monocytes Absolute: 0.5 10*3/uL (ref 0.1–1.0)
Monocytes Relative: 9.3 % (ref 3.0–12.0)
Neutro Abs: 3.7 10*3/uL (ref 1.4–7.7)
Neutrophils Relative %: 66.5 % (ref 43.0–77.0)
Platelets: 212 10*3/uL (ref 150.0–400.0)
RBC: 4.48 Mil/uL (ref 3.87–5.11)
RDW: 17.8 % — ABNORMAL HIGH (ref 11.5–15.5)
WBC: 5.5 10*3/uL (ref 4.0–10.5)

## 2022-11-15 LAB — VITAMIN B12: Vitamin B-12: 214 pg/mL (ref 211–911)

## 2022-11-15 LAB — VITAMIN D 25 HYDROXY (VIT D DEFICIENCY, FRACTURES): VITD: 24.2 ng/mL — ABNORMAL LOW (ref 30.00–100.00)

## 2022-11-15 LAB — TSH: TSH: 4.33 u[IU]/mL (ref 0.35–5.50)

## 2022-11-15 MED ORDER — GABAPENTIN 300 MG PO CAPS: 300.0000 mg | ORAL_CAPSULE | Freq: Every day | ORAL | 1 refills | Status: DC

## 2022-11-15 MED ORDER — VITAMIN D (ERGOCALCIFEROL) 1.25 MG (50000 UNIT) PO CAPS: 50000.0000 [IU] | ORAL_CAPSULE | ORAL | 0 refills | Status: DC

## 2022-11-15 NOTE — Addendum Note (Signed)
Addended by: Westley Hummer B on: 11/15/2022 09:45 AM   Modules accepted: Orders

## 2022-11-15 NOTE — Progress Notes (Signed)
Established Patient Office Visit     CC/Reason for Visit: Annual preventive exam and subsequent Medicare wellness visit  HPI: Kathy [name] is a 72 y.o. female who is coming in today for the above mentioned reasons. 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  She is being closely followed by cardiology.  She has routine eye and dental care, no perceived hearing difficulty.  Due for flu, pneumonia, COVID, shingles, Tdap vaccinations.  She is due for bone density.  She no longer does Pap smears but mammogram and colonoscopy are up-to-date.   Past Medical/Surgical History: Past Medical History:  Diagnosis Date   Adenomatous colon polyp    2012   Anxiety and depression    Arthritis    Osteoarthritis, Dr Maureen Ralphs   Cancer [facility])    skin - left foot excised   Chronic diastolic CHF (congestive heart failure) (HCC)    DDD (degenerative disc disease), cervical    Deaf, right    DVT (deep venous thrombosis) ([place])    RLE DVT 08/2004, post right knee arthroscopy   Fibromyalgia    Dr Tobie Lords, Rheu   GERD (gastroesophageal reflux disease)    H/O hiatal hernia    Headache(784.0)    PAST HX MILD MIGRAINES - resolved per patient 08/18/20   History of kidney stones    passed stones   HTN (hypertension)    IBS (irritable bowel syndrome)    Internal hemorrhoid    Lumbar radiculopathy    Obesity    OSA on CPAP    Moderate with AHI 20/hr with nocturnal hypoxemia on CPAP   PAF (paroxysmal atrial fibrillation) ([place]) 06/2018   Pulmonary hypertension ([place])     Past Surgical History:  Procedure Laterality Date   ABDOMINAL HYSTERECTOMY     dysfunctional menses   APPENDECTOMY     BREAST EXCISIONAL BIOPSY Right    CARDIAC  CATHETERIZATION  06/26/2018   CARDIOVERSION N/A 10/10/2022   Procedure: CARDIOVERSION;  Surgeon: Hebert Soho, DO;  Location: [facility];  Service: Cardiovascular;  Laterality: N/A;   CHOLECYSTECTOMY     COLONOSCOPY  05/27/2002, 08/08/11   2003 diverticulosis, hemorrhoids 2012 same + cecal polyp   EYE SURGERY     KNEE ARTHROSCOPY  2008   right   KYPHOPLASTY Bilateral 08/19/2020   Procedure: KYPHOPLASTY LUMBAR ONE;  Surgeon: Vallarie Mare, MD;  Location: [facility];  Service: Neurosurgery;  Laterality: Bilateral;  posterior   LEFT HEART CATH AND CORONARY ANGIOGRAPHY N/A 06/26/2018   Procedure: LEFT HEART CATH AND CORONARY ANGIOGRAPHY;  Surgeon: Wellington Hampshire, MD;  Location: [facility];  Service: Cardiovascular;  Laterality: N/A;   MELANOMA EXCISION Left 07/2013   foot   RIGHT HEART CATH N/A 10/10/2022   Procedure: RIGHT HEART CATH;  Surgeon: Hebert Soho, DO;  Location: [facility];  Service: Cardiovascular;  Laterality: N/A;   TEE WITHOUT CARDIOVERSION N/A 10/10/2022   Procedure: TRANSESOPHAGEAL ECHOCARDIOGRAM (TEE);  Surgeon: Hebert Soho, DO;  Location: [facility];  Service: Cardiovascular;  Laterality: N/A;   TOTAL HIP ARTHROPLASTY  02/2010   left ; Dr Maureen Ralphs   TOTAL HIP ARTHROPLASTY  03/18/2012   Procedure: TOTAL HIP ARTHROPLASTY;  Surgeon: Gearlean Alf, MD;  Location: [facility];  Service: Orthopedics;  Laterality: Right;   UPPER GASTROINTESTINAL  ENDOSCOPY  04/21/2009, 08/08/11   2010 gastritis ;2012 small hiatal hernia. Dr Carlean Purl   WRIST FRACTURE SURGERY Left     Social History:  reports that she has never smoked. She has never used smokeless tobacco. She reports current alcohol use. She reports that she does not use drugs.  Allergies: Allergies  Allergen Reactions   Achromycin [Tetracycline Hcl] Shortness Of Breath    Dyspnea     Penicillins     Facial angioedema Did it involve swelling of the face/tongue/throat, SOB, or low BP? Yes Did  it involve sudden or severe rash/hives, skin peeling, or any reaction on the inside of your mouth or nose? No Did you need to seek medical attention at a hospital or doctor's office? No When did it last happen?      30+ years If all above answers are "NO", may proceed with cephalosporin use.    Sulfamethoxazole-Trimethoprim Hives   Nickel Itching   Zanaflex [Tizanidine Hydrochloride]     Causes more pain     Family History:  Family History  Problem Relation Age of Onset   Heart attack Mother 37   COPD Mother    Breast cancer Mother 27   Hypertension Father    Diabetes Father    Heart failure Father 28   Breast cancer Paternal Aunt        cns mets   COPD Maternal Grandmother    Heart disease Paternal Grandmother        died 36   Diabetes Paternal Grandmother    Stroke Paternal Grandfather        in late 13s   Heart attack Brother 34   Colon cancer Neg Hx      Current Outpatient Medications:    ALPRAZolam (XANAX) 1 MG tablet, TAKE 1 TABLET BY MOUTH THREE TIMES A DAY AS NEEDED FOR ANXIETY, Disp: 60 tablet, Rfl: 0   amiodarone (PACERONE) 200 MG tablet, Take 1 tablet (200 mg total) by mouth 2 (two) times daily. Please cancel all previous orders for current medication. Change in dosage or pill size., Disp: 180 tablet, Rfl: 3   apixaban (ELIQUIS) 5 MG TABS tablet, Take 1 tablet (5 mg total) by mouth 2 (two) times daily., Disp: 56 tablet, Rfl: 0   atorvastatin (LIPITOR) 40 MG tablet, TAKE 1 TABLET BY MOUTH EVERY DAY, Disp: 90 tablet, Rfl: 3   Calcium Carb-Cholecalciferol (CALCIUM 600 + D PO), Take 1 tablet by mouth daily., Disp: , Rfl:    clindamycin (CLEOCIN T) 1 % lotion, Apply 1 application  topically 2 (two) times daily as needed (rosacea)., Disp: , Rfl:    clobetasol (TEMOVATE) 0.05 % external solution, Apply 1 application topically daily as needed (rosacea)., Disp: , Rfl:    cyclobenzaprine (FLEXERIL) 5 MG tablet, Take 5 mg by mouth at bedtime as needed for muscle spasms.,  Disp: , Rfl:    ENTRESTO 24-26 MG, TAKE 1 TABLET BY MOUTH TWICE A DAY, Disp: 60 tablet, Rfl: 0   fluocinonide (LIDEX) 0.05 % external solution, Apply 1 application topically daily as needed (rosacea)., Disp: , Rfl:    FLUoxetine (PROZAC) 40 MG capsule, TAKE 1 CAPSULE BY MOUTH EVERY DAY, Disp: 90 capsule, Rfl: 0   furosemide (LASIX) 40 MG tablet, Take 1 tablet (40 mg total) by mouth daily., Disp: 90 tablet, Rfl: 1   HYDROcodone-acetaminophen (NORCO/VICODIN) 5-325 MG tablet, Take 1 tablet by mouth every 6 (six) hours as needed for moderate pain., Disp: 30 tablet, Rfl: 0   methocarbamol (  ROBAXIN) 500 MG tablet, Take 500 mg by mouth every 8 (eight) hours as needed., Disp: , Rfl:    metroNIDAZOLE (METROCREAM) 0.75 % cream, Apply 1 application topically 2 (two) times daily as needed (rosacea). , Disp: , Rfl:    Multiple Vitamin (MULTIVITAMIN WITH MINERALS) TABS tablet, Take 1 tablet by mouth daily. Centrum Silver, Disp: , Rfl:    Multiple Vitamin (MULTIVITAMIN) capsule, Take 1 capsule by mouth daily., Disp: , Rfl:    nitroGLYCERIN (NITROSTAT) 0.4 MG SL tablet, Place 1 tablet (0.4 mg total) under the tongue every 5 (five) minutes as needed for chest pain., Disp: 90 tablet, Rfl: 3   omeprazole (PRILOSEC) 40 MG capsule, TAKE 1 CAPSULE BY MOUTH EVERY DAY, Disp: 90 capsule, Rfl: 1   potassium chloride (KLOR-CON) 10 MEQ tablet, Take 1 tablet (10 mEq total) by mouth daily., Disp: 90 tablet, Rfl: 3   gabapentin (NEURONTIN) 300 MG capsule, Take 1 capsule (300 mg total) by mouth at bedtime., Disp: 90 capsule, Rfl: 1  Review of Systems:  Negative unless indicated in HPI.   Physical Exam: Vitals:   11/15/22 0815  BP: (!) 108/50  Pulse: 65  Temp: 98.1 F (36.7 C)  TempSrc: Oral  SpO2: 98%  Weight: 292 lb 3.2 oz (132.5 kg)  Height: '5\' 6"'$  (1.676 m)    Body mass index is 47.16 kg/m.   Physical Exam Vitals reviewed.  Constitutional:      General: She is not in acute distress.    Appearance: Normal  appearance. She is not ill-appearing, toxic-appearing or diaphoretic.  HENT:     Head: Normocephalic.     Right Ear: Tympanic membrane, ear canal and external ear normal. There is no impacted cerumen.     Left Ear: Tympanic membrane, ear canal and external ear normal. There is no impacted cerumen.     Nose: Nose normal.     Mouth/Throat:     Mouth: Mucous membranes are moist.     Pharynx: Oropharynx is clear. No oropharyngeal exudate or posterior oropharyngeal erythema.  Eyes:     General: No scleral icterus.       Right eye: No discharge.        Left eye: No discharge.     Conjunctiva/sclera: Conjunctivae normal.     Pupils: Pupils are equal, round, and reactive to light.  Neck:     Vascular: No carotid bruit.  Cardiovascular:     Rate and Rhythm: Normal rate and regular rhythm.     Pulses: Normal pulses.     Heart sounds: Normal heart sounds.  Pulmonary:     Effort: Pulmonary effort is normal. No respiratory distress.     Breath sounds: Normal breath sounds.  Abdominal:     General: Abdomen is flat. Bowel sounds are normal.     Palpations: Abdomen is soft.  Musculoskeletal:        General: Normal range of motion.     Cervical back: Normal range of motion.  Skin:    General: Skin is warm and dry.  Neurological:     General: No focal deficit present.     Mental Status: She is alert and oriented to person, place, and time. Mental status is at baseline.  Psychiatric:        Mood and Affect: Mood normal.        Behavior: Behavior normal.        Thought Content: Thought content normal.        Judgment: Judgment normal.  Subsequent Medicare wellness visit   1. Risk factors, based on past  M,S,F - Cardiac Risk Factors include: advanced age (>92mn, >>57women);hypertension;sedentary lifestyle   2.  Physical activities: Dietary issues and exercise activities discussed:  Current Exercise Habits: The patient does not participate in regular exercise at present, Exercise  limited by: orthopedic condition(s)   3.  Depression/mood:  FCementonOffice [facility]        4.  ADL's:    11/15/2022    8:13 AM 07/26/2022    9:04 PM  In your present state of health, do you have any difficulty performing the following activities:  Hearing? 0 0  Vision? 0 0  Difficulty concentrating or making decisions? 0 0  Walking or climbing stairs? 1 1  Dressing or bathing? 0 0  Doing errands, shopping? 0 0  Preparing Food and eating ? N   Using the Toilet? N   In the past six months, have you accidently leaked urine? Y   Do you have problems with loss of bowel control? N   Managing your Medications? N   Managing your Finances? N   Housekeeping or managing your Housekeeping? N      5.  Fall risk:     07/29/2022    8:00 AM 07/29/2022    8:49 PM 07/30/2022   10:00 AM 08/27/2022    9:10 PM 11/15/2022    8:18 AM  Fall Risk  Falls in the past year?     1  Was there an injury with Fall?     1  Fall Risk Category Calculator     3  (RETIRED) Patient Fall Risk Level High fall risk High fall risk High fall risk Moderate fall risk   Patient at Risk for Falls Due to     Impaired balance/gait  Fall risk Follow up     Falls evaluation completed     6.  Home safety: No problems identified   7.  Height weight, and visual acuity: height and weight as above, vision/hearing: Vision Screening   Right eye Left eye Both eyes  Without correction '20/25 20/25 20/25 '$  With correction        8.  Counseling: Counseling given: Not Answered    9. Lab orders based on risk factors: Laboratory update will be reviewed   10. Cognitive assessment:        11/15/2022    8:20 AM  6CIT Screen  What Year? 0 points  What month? 0 points  What time? 0 points  Count back from 20 0 points  Months in reverse 0 points  Repeat phrase 0 points  Total Score 0 points     11. Screening: Patient provided  with a written and personalized 5-10 year screening schedule in the AVS. Health Maintenance  Topic Date Due   DTaP/Tdap/Td vaccine (1 - Tdap) Never done   COVID-19 Vaccine (4 - 2023-24 season) 12/01/2022*   Flu Shot  12/17/2022*   Pneumonia Vaccine (1 of 1 - PCV) 12/20/2022*   Zoster (Shingles) Vaccine (1 of 2) 02/13/2023*   Medicare Annual Wellness Visit  11/16/2023   Mammogram  05/15/2024   Colon Cancer Screening  10/20/2024   DEXA scan (bone density measurement)  Completed   Hepatitis C Screening: USPSTF Recommendation to screen - Ages 154-79yo.  Completed   HPV Vaccine  Aged Out  *Topic was postponed. The date shown is  not the original due date.    12. Provider List Update: Patient Care Team    Relationship Specialty Notifications Start End  Isaac Bliss, Rayford Halsted, MD PCP - General Internal Medicine  04/26/21    Comment: Rayfield Citizen, Wonda Cheng, MD PCP - Cardiology Cardiology Admissions 08/07/18    Comment: Unk Pinto, MD  Internal Medicine  04/26/21    Comment: Merged     13. Advance Directives: Does Patient Have a Medical Advance Directive?: Yes Type of Advance Directive: Healthcare Power of Attorney, Living will, Out of facility DNR (pink MOST or yellow form) Copy of [place] in Chart?: Yes - validated most recent copy scanned in chart (See row information)  14. Opioids: Patient is not on any opioid prescriptions and has no risk factors for a substance use disorder.   15.   Goals      Become More Active     Timeframe:  Long-Range Goal Priority:  Medium Start Date:                             Expected End Date:                       Follow Up Date 11/15/2022    - go out for a short walk before breakfast, after dinner or both    Why is this important?   It is easy to come up with reasons not to exercise.  These steps will help you get started and have fun doing it.    Notes:          I have personally  reviewed and noted the following in the patient's chart:   Medical and social history Use of alcohol, tobacco or illicit drugs  Current medications and supplements Functional ability and status Nutritional status Physical activity Advanced directives List of other physicians Hospitalizations, surgeries, and ER visits in previous 12 months Vitals Screenings to include cognitive, depression, and falls Referrals and appointments  In addition, I have reviewed and discussed with patient certain preventive protocols, quality metrics, and best practice recommendations. A written personalized care plan for preventive services as well as general preventive health recommendations were provided to patient.  Impression and Plan:  Encounter for subsequent annual wellness visit (AWV) in Medicare patient  Fatigue, unspecified type  Essential hypertension - Plan: CBC with Differential/Platelet, Comprehensive metabolic panel  Stage 3a chronic kidney disease ([place])  Chronic diastolic CHF (congestive heart failure) ([place]) - Plan: Lipid panel  Immunization due  Morbid obesity ([place]) - Plan: TSH, Vitamin B12, Hemoglobin A1c, Vitamin D, 25-hydroxy  Paroxysmal atrial fibrillation (HCC)  OSA on CPAP  Fibromyalgia - Plan: gabapentin (NEURONTIN) 300 MG capsule  -Recommend routine eye and dental care. -Healthy lifestyle discussed in detail. -Labs to be updated today. -Prostate cancer screening: Not applicable Health Maintenance  Topic Date Due   DTaP/Tdap/Td vaccine (1 - Tdap) Never done   COVID-19 Vaccine (4 - 2023-24 season) 12/01/2022*   Flu Shot  12/17/2022*   Pneumonia Vaccine (1 of 1 - PCV) 12/20/2022*   Zoster (Shingles) Vaccine (1 of 2) 02/13/2023*   Medicare Annual Wellness Visit  11/16/2023   Mammogram  05/15/2024   Colon Cancer Screening  10/20/2024   DEXA scan (bone density measurement)  Completed   Hepatitis C Screening: USPSTF Recommendation to screen - Ages 55-79 yo.  Completed    HPV Vaccine  Aged Out  *Topic was postponed. The date shown is not the original due date.   -Flu vaccine administered in office today. -Advised to update pneumonia, Tdap and shingles. -Bone density requested today.      Lelon Frohlich, MD [facility]

## 2022-11-17 ENCOUNTER — Ambulatory Visit (HOSPITAL_COMMUNITY)
Admission: RE | Admit: 2022-11-17 | Discharge: 2022-11-17 | Disposition: A | Discharge status: Home / Self Care | Payer: Medicare Other | Source: Ambulatory Visit | Attending: Cardiology | Admitting: Cardiology

## 2022-11-17 ENCOUNTER — Encounter (HOSPITAL_COMMUNITY): Payer: Self-pay | Admitting: Cardiology

## 2022-11-17 VITALS — BP 138/74 | HR 63 | Wt 294.2 lb

## 2022-11-17 DIAGNOSIS — I480 Paroxysmal atrial fibrillation: Secondary | ICD-10-CM | POA: Diagnosis not present

## 2022-11-17 DIAGNOSIS — I2510 Atherosclerotic heart disease of native coronary artery without angina pectoris: Secondary | ICD-10-CM | POA: Diagnosis not present

## 2022-11-17 DIAGNOSIS — Z7901 Long term (current) use of anticoagulants: Secondary | ICD-10-CM | POA: Diagnosis not present

## 2022-11-17 DIAGNOSIS — I5032 Chronic diastolic (congestive) heart failure: Secondary | ICD-10-CM | POA: Diagnosis not present

## 2022-11-17 DIAGNOSIS — I50810 Right heart failure, unspecified: Secondary | ICD-10-CM | POA: Diagnosis not present

## 2022-11-17 DIAGNOSIS — I4891 Unspecified atrial fibrillation: Secondary | ICD-10-CM | POA: Diagnosis not present

## 2022-11-17 DIAGNOSIS — G4733 Obstructive sleep apnea (adult) (pediatric): Secondary | ICD-10-CM | POA: Diagnosis not present

## 2022-11-17 DIAGNOSIS — I110 Hypertensive heart disease with heart failure: Secondary | ICD-10-CM | POA: Diagnosis not present

## 2022-11-17 DIAGNOSIS — Z79899 Other long term (current) drug therapy: Secondary | ICD-10-CM | POA: Insufficient documentation

## 2022-11-17 MED ORDER — ENTRESTO 49-51 MG PO TABS: 1.0000 | ORAL_TABLET | Freq: Two times a day (BID) | ORAL | 11 refills | Status: DC

## 2022-11-17 NOTE — Patient Instructions (Signed)
INCREASE Entresto to 49/51 mg Twice daily  You have been referred to Dr. Radford Pax for your CPAP. Her office will call you to arrange your appointment.  Your physician recommends that you schedule a follow-up appointment in: 3 months (June ) ** please call the office in April to arrange your follow up appointment. **  If you have any questions or concerns before your next appointment please send [name] a message through [place] or call our office at [phone].    TO LEAVE A MESSAGE FOR THE NURSE SELECT OPTION 2, PLEASE LEAVE A MESSAGE INCLUDING: YOUR NAME DATE OF BIRTH CALL BACK NUMBER REASON FOR CALL**this is important as we prioritize the call backs  YOU WILL RECEIVE A CALL BACK THE SAME DAY AS LONG AS YOU CALL BEFORE 4:00 PM  At the [facility], you and your health needs are our priority. As part of our continuing mission to provide you with exceptional heart care, we have created designated Provider Care Teams. These Care Teams include your primary Cardiologist (physician) and Advanced Practice Providers (APPs- Physician Assistants and Nurse Practitioners) who all work together to provide you with the care you need, when you need it.   You may see any of the following providers on your designated Care Team at your next follow up: Dr Glori Bickers Dr Loralie Champagne Dr. Roxana Hires, NP Lyda Jester, [place] [facility] [place], [place] Forestine Na, NP Audry Riles, PharmD   Please be sure to bring in all your medications bottles to every appointment.    Thank you for choosing [facility]

## 2022-11-17 NOTE — Progress Notes (Signed)
ADVANCED HEART FAILURE CLINIC NOTE  Referring Physician: Isaac [name], Kathy [name]*  Primary Care: Kathy [name], Kathy Halsted, MD Primary Cardiologist: Dr. Debara [name]   HPI: Kathy [name] is a 72 y.o. female with HFpEF, paroxysmal atrial fibrillation, OSA (CPAP), hyperlipidemia and HTN presenting today for evaluation. Kathy [name] cardiac history dates back to 2019 when she presented to the ER  w/ complaints of dyspnea. She was found to have atrial fibrillation  w/ TTE demonstrating preserved LV function and severe LVH. She converted to NSR with metoprolol. Since that time she has also underwent LHC w/ nonobstructive CAD. Over the past 2 years she has continued to struggle with symptomatic paroxysmal atrial fibrillation. In 11/23 she was admitted with acute on chronic HFpEF; TTE at that time with dilated RV w/ moderately reduced systolic function. She was diuresed and discharged home. Since that time she has her functional status remains fairly limited.  She can perform all ADLs independently but frequently becomes very fatigued and short of breath after walking over 30 to 40 feet.  No PND and currently lower extremity edema is fairly well-controlled however she does quickly become hypervolemic with any dietary indiscretion.Kathy [name] has undergone RHC confirming RV dysfunction with reduced cardiac output/index likely secondary to long term OSA and severe TR. In addition, she has had afib for a prolonged period; underwent TEE/DCCV with conversion to sinus rhythm.   Interval hx:  Since her last appointment she reports significant improvement in exercise capacity.  She is now ambulating around the house, performing ADLs, cleaning, going on walks without significant difficulty.  She still has short periods of dyspnea however believes that these are significantly better than previous.  Activity level/exercise tolerance: NYHA IIB Orthopnea:  Sleeps on 2-3 pillows Paroxysmal noctural dyspnea: No Chest  pain/pressure: No Orthostatic lightheadedness: No Palpitations: No Lower extremity edema: No Presyncope/syncope: No Cough: No  Past Medical History:  Diagnosis Date   Adenomatous colon polyp    2012   Anxiety and depression    Arthritis    Osteoarthritis, Dr Kathy [name]   Cancer [facility])    skin - left foot excised   Chronic diastolic CHF (congestive heart failure) ([place])    DDD (degenerative disc disease), cervical    Deaf, right    DVT (deep venous thrombosis) ([place])    RLE DVT 08/2004, post right knee arthroscopy   Fibromyalgia    Dr Kathy [name], Rheu   GERD (gastroesophageal reflux disease)    H/O hiatal hernia    Headache(784.0)    PAST HX MILD MIGRAINES - resolved per patient 08/18/20   History of kidney stones    passed stones   HTN (hypertension)    IBS (irritable bowel syndrome)    Internal hemorrhoid    Lumbar radiculopathy    Obesity    OSA on CPAP    Moderate with AHI 20/hr with nocturnal hypoxemia on CPAP   PAF (paroxysmal atrial fibrillation) ([place]) 06/2018   Pulmonary hypertension (HCC)     Current Outpatient Medications  Medication Sig Dispense Refill   ALPRAZolam (XANAX) 1 MG tablet TAKE 1 TABLET BY MOUTH THREE TIMES A DAY AS NEEDED FOR ANXIETY 60 tablet 0   amiodarone (PACERONE) 200 MG tablet Take 1 tablet (200 mg total) by mouth 2 (two) times daily. Please cancel all previous orders for current medication. Change in dosage or pill size. 180 tablet 3   apixaban (ELIQUIS) 5 MG TABS tablet Take 1 tablet (5 mg total) by mouth 2 (two) times daily. [place]  tablet 0   atorvastatin (LIPITOR) 40 MG tablet TAKE 1 TABLET BY MOUTH EVERY DAY 90 tablet 3   Calcium Carb-Cholecalciferol (CALCIUM 600 + D PO) Take 1 tablet by mouth daily.     clindamycin (CLEOCIN T) 1 % lotion Apply 1 application  topically 2 (two) times daily as needed (rosacea).     clobetasol (TEMOVATE) 0.05 % external solution Apply 1 application topically daily as needed (rosacea).     cyclobenzaprine  (FLEXERIL) 5 MG tablet Take 5 mg by mouth at bedtime as needed for muscle spasms.     ENTRESTO 24-26 MG TAKE 1 TABLET BY MOUTH TWICE A DAY 60 tablet 0   fluocinonide (LIDEX) 0.05 % external solution Apply 1 application topically daily as needed (rosacea).     FLUoxetine (PROZAC) 40 MG capsule TAKE 1 CAPSULE BY MOUTH EVERY DAY 90 capsule 0   furosemide (LASIX) 40 MG tablet Take 1 tablet (40 mg total) by mouth daily. 90 tablet 1   gabapentin (NEURONTIN) 300 MG capsule Take 1 capsule (300 mg total) by mouth at bedtime. 90 capsule 1   HYDROcodone-acetaminophen (NORCO/VICODIN) 5-325 MG tablet Take 1 tablet by mouth every 6 (six) hours as needed for moderate pain. 30 tablet 0   methocarbamol (ROBAXIN) 500 MG tablet Take 500 mg by mouth every 8 (eight) hours as needed.     metroNIDAZOLE (METROCREAM) 0.75 % cream Apply 1 application topically 2 (two) times daily as needed (rosacea).      Multiple Vitamin (MULTIVITAMIN WITH MINERALS) TABS tablet Take 1 tablet by mouth daily. Centrum Silver     nitroGLYCERIN (NITROSTAT) 0.4 MG SL tablet Place 1 tablet (0.4 mg total) under the tongue every 5 (five) minutes as needed for chest pain. 90 tablet 3   omeprazole (PRILOSEC) 40 MG capsule TAKE 1 CAPSULE BY MOUTH EVERY DAY 90 capsule 1   potassium chloride (KLOR-CON) 10 MEQ tablet Take 1 tablet (10 mEq total) by mouth daily. 90 tablet 3   No current facility-administered medications for this encounter.    Allergies  Allergen Reactions   Achromycin [Tetracycline Hcl] Shortness Of Breath    Dyspnea     Penicillins     Facial angioedema Did it involve swelling of the face/tongue/throat, SOB, or low BP? Yes Did it involve sudden or severe rash/hives, skin peeling, or any reaction on the inside of your mouth or nose? No Did you need to seek medical attention at a hospital or doctor's office? No When did it last happen?      30+ years If all above answers are "NO", may proceed with cephalosporin use.     Sulfamethoxazole-Trimethoprim Hives   Nickel Itching   Zanaflex [Tizanidine Hydrochloride]     Causes more pain       Social History   Socioeconomic History   Marital status: Widowed    Spouse name: Not on file   Number of children: 2   Years of education: Not on file   Highest education level: Not on file  Occupational History   Occupation: retired  Tobacco Use   Smoking status: Never   Smokeless tobacco: Never  Vaping Use   Vaping Use: Never used  Substance and Sexual Activity   Alcohol use: Yes    Comment: occasional   Drug use: No   Sexual activity: Not on file    Comment: Hysterectomy  Other Topics Concern   Not on file  Social History Narrative   0 caffeine drinks    Social Determinants  of Health   Financial Resource Strain: Low Risk  (11/15/2022)   Overall Financial Resource Strain (CARDIA)    Difficulty of Paying Living Expenses: Not hard at all  Food Insecurity: No Food Insecurity (11/15/2022)   Hunger Vital Sign    Worried About Running Out of Food in the Last Year: Never true    Ran Out of Food in the Last Year: Never true  Transportation Needs: No Transportation Needs (11/15/2022)   PRAPARE - Hydrologist (Medical): No    Lack of Transportation (Non-Medical): No  Physical Activity: Insufficiently Active (11/15/2022)   Exercise Vital Sign    Days of Exercise per Week: 3 days    Minutes of Exercise per Session: 20 min  Stress: No Stress Concern Present (11/15/2022)   [place]    Feeling of Stress : Only a little  Social Connections: Moderately Isolated (11/15/2022)   Social Connection and Isolation Panel [NHANES]    Frequency of Communication with Friends and Family: More than three times a week    Frequency of Social Gatherings with Friends and Family: Three times a week    Attends Religious Services: More than 4 times per year    Active Member of Clubs or  Organizations: No    Attends Archivist Meetings: Never    Marital Status: Widowed  Intimate Partner Violence: Not At Risk (11/15/2022)   Humiliation, Afraid, Rape, and Kick questionnaire    Fear of Current or Ex-Partner: No    Emotionally Abused: No    Physically Abused: No    Sexually Abused: No      Family History  Problem Relation Age of Onset   Heart attack Mother 42   COPD Mother    Breast cancer Mother 41   Hypertension Father    Diabetes Father    Heart failure Father 54   Breast cancer Paternal Aunt        cns mets   COPD Maternal Grandmother    Heart disease Paternal Grandmother        died 53   Diabetes Paternal Grandmother    Stroke Paternal Grandfather        in late 44s   Heart attack Brother 20   Colon cancer Neg Hx     PHYSICAL EXAM: Vitals:   11/17/22 1422  BP: 138/74  Pulse: 63  SpO2: 97%   GENERAL: No apparent distress HEENT: Negative for arcus senilis or xanthelasma. There is no scleral icterus.  The mucous membranes are pink and moist.   NECK: Supple, No masses. Normal carotid upstrokes without bruits. No masses or thyromegaly.    CHEST: There are no chest wall deformities. There is no chest wall tenderness. Respirations are unlabored.  Lungs-CTA bilaterally CARDIAC: Thick neck, difficult to assess.  Does not appear elevated         Normal S1, S2  Normal rate with regular rhythm. No murmurs, rubs or gallops.  Pulses are 2+ and symmetrical in upper and lower extremities.  No edema.  Normal pulses. ABDOMEN: Soft, non-tender, non-distended. There are no masses or hepatomegaly. There are normal bowel sounds.  EXTREMITIES: Warm and well-perfused with no peripheral edema. LYMPHATIC: No axillary or supraclavicular lymphadenopathy.  NEUROLOGIC: Patient is oriented x3 with no focal or lateralizing neurologic deficits.  PSYCH: Patients affect is appropriate, there is no evidence of anxiety or depression.  SKIN: Warm and dry.  DATA  REVIEW  ECG: Atrial  fibrillation (09/22/2022)  ECHO: 07/27/22: LVEF 50-55%, RV is dilated with mild to moderately reduced systolic function. RA is severely dilated. There is severe eccentric TR.    CATH: 06/26/18:  Ost RCA lesion is 30% stenosed. The left ventricular systolic function is normal. LV end diastolic pressure is mildly elevated. The left ventricular ejection fraction is 55-65% by visual estimate.   10/10/22: RA:                  13 mmHg (mean) RV:                  41/10-15 mmHg PA:                  40/15 mmHg (21 mean) PCWP:            14 mmHg (mean)                                      Estimated Fick CO/CI   4.39 L/min, 1.9 L/min/m2 Thermodilution CO/CI   3.61 L/min, 1.5 L/min/m2                                            TPG                 7  mmHg                                              PVR                 1.6-1.94 Wood Units  PAPi                1.9       IMPRESSION: Severely elevated pre-capillary pressures.  Normal left sided filling pressures Normal PVR and PA mean.  Severely reduced cardiac index likely secondary to suboptimal RV function. ([id number] of 1, PAPi of 1.9. )   ASSESSMENT & PLAN:  RV Failure/Severe TR - Echocardiogram's dating back to 2019 demonstrate severe TR. On her initial TTE in 2019 she has severe TR with mildly dilated RV and preserved LV function. Over the past 3 years, echocardiograms demonstrate gradual progression of TR and worsening of RV function. Uncertain if this is primary TR or a sequale of underlying pulmonary hypertension.  - RHC/TEE on 10/10/22 confirming RV failure with severe TR. Likely has had prolonged atrial fibrillation and OSA. Will plan for CMR for further evaluation.  - Now S/P TEE; she continues to report improvement in functional status since cardioversion and addition of amiodarone.  For the time.  Will continue amiodarone 200 mg twice daily.  Plan to obtain TSH and LFTs at next visit.  Increase Entresto to 49/51  mg twice daily. - Will consider triclip in the future  2. Heart failure w/ preserved EF  - NYHA II-III symptoms; most recent TTE w/ indeterminate diastolic function; tissue doppler not consistent with significant diastolic dysfunction. Will continue lasix '40mg'$  daily for the time being, increase Entresto to 49/51 mg twice daily  3. Atrial fibrillation  - Apixaban '5mg'$   - Now s/p TEE/DCCV - Amiodarone '200mg'$  BID.   4. OSA  - uses CPAP at  night, however, now having issues with her machine. Last sleep study was over 2 years ago.  - will refer to pulmonology for further evaluation and equipment changes.    Kathy [name] Advanced Heart Failure Mechanical Circulatory Support

## 2022-11-20 ENCOUNTER — Telehealth (HOSPITAL_COMMUNITY): Payer: Self-pay

## 2022-11-20 ENCOUNTER — Encounter (HOSPITAL_COMMUNITY): Payer: Self-pay

## 2022-11-20 NOTE — Telephone Encounter (Signed)
Pt insurance is active and benefits verified through [facility] Medicare Co-pay 0, DED 0/0 met, out of pocket $3,200/$898.33 met, co-insurance 10%. no pre-authorization required, Rebecca/[employer] 11/20/22'@3'$ :50pm, REF# [id number]

## 2022-11-20 NOTE — Telephone Encounter (Signed)
Called patient to see if she was interested in participating in the Pulmonary Rehab Program. Patient stated yes. Patient will come in for orientation on 11/24/22'@1pm'$  and will attend the 1:15pm exercise class.   Sent information via [employer]

## 2022-11-23 ENCOUNTER — Telehealth (HOSPITAL_COMMUNITY): Payer: Self-pay

## 2022-11-23 NOTE — Telephone Encounter (Signed)
Pt called to confirm PR orientation for tomorrow. No answer. Voicemail left.

## 2022-11-24 ENCOUNTER — Encounter (HOSPITAL_COMMUNITY): Payer: Self-pay

## 2022-11-24 ENCOUNTER — Encounter (HOSPITAL_COMMUNITY)
Admission: RE | Admit: 2022-11-24 | Discharge: 2022-11-24 | Disposition: A | Discharge status: Home / Self Care | Payer: Medicare Other | Source: Ambulatory Visit | Attending: Cardiology | Admitting: Cardiology

## 2022-11-24 VITALS — BP 110/64 | HR 64 | Ht 66.0 in | Wt 297.6 lb

## 2022-11-24 DIAGNOSIS — I5032 Chronic diastolic (congestive) heart failure: Secondary | ICD-10-CM | POA: Diagnosis not present

## 2022-11-24 DIAGNOSIS — Z5189 Encounter for other specified aftercare: Secondary | ICD-10-CM | POA: Diagnosis not present

## 2022-11-24 NOTE — Progress Notes (Signed)
Pulmonary Individual Treatment Plan  Patient Details  Name: Kathy [name] MRN: [id number] Date of Birth: [birth year] Referring Provider:   April Manson Pulmonary Rehab Walk Test from 11/24/2022 in [facility] for Heart, Vascular, & [place]  Referring Provider Sabharwal       Initial Encounter Date:  Flowsheet Row Pulmonary Rehab Walk Test from 11/24/2022 in [facility] for Heart, Vascular, & [place]  Date 11/24/22       Visit Diagnosis: Chronic diastolic CHF (congestive heart failure) ([place])  Patient's Home Medications on Admission:   Current Outpatient Medications:    ALPRAZolam (XANAX) 1 MG tablet, TAKE 1 TABLET BY MOUTH THREE TIMES A DAY AS NEEDED FOR ANXIETY, Disp: 60 tablet, Rfl: 0   amiodarone (PACERONE) 200 MG tablet, Take 1 tablet (200 mg total) by mouth 2 (two) times daily. Please cancel all previous orders for current medication. Change in dosage or pill size., Disp: 180 tablet, Rfl: 3   apixaban (ELIQUIS) 5 MG TABS tablet, Take 1 tablet (5 mg total) by mouth 2 (two) times daily., Disp: 56 tablet, Rfl: 0   atorvastatin (LIPITOR) 40 MG tablet, TAKE 1 TABLET BY MOUTH EVERY DAY, Disp: 90 tablet, Rfl: 3   Calcium Carb-Cholecalciferol (CALCIUM 600 + D PO), Take 1 tablet by mouth daily., Disp: , Rfl:    cholecalciferol (VITAMIN D3) 25 MCG (1000 UNIT) tablet, Take 1,000 Units by mouth once a week. For 12 weeks, Disp: , Rfl:    clindamycin (CLEOCIN T) 1 % lotion, Apply 1 application  topically 2 (two) times daily as needed (rosacea)., Disp: , Rfl:    clobetasol (TEMOVATE) 0.05 % external solution, Apply 1 application topically daily as needed (rosacea)., Disp: , Rfl:    cyclobenzaprine (FLEXERIL) 5 MG tablet, Take 5 mg by mouth at bedtime as needed for muscle spasms., Disp: , Rfl:    fluocinonide (LIDEX) 0.05 % external solution, Apply 1 application topically daily as needed (rosacea)., Disp: , Rfl:    FLUoxetine (PROZAC) 40  MG capsule, TAKE 1 CAPSULE BY MOUTH EVERY DAY, Disp: 90 capsule, Rfl: 0   furosemide (LASIX) 40 MG tablet, Take 1 tablet (40 mg total) by mouth daily., Disp: 90 tablet, Rfl: 1   gabapentin (NEURONTIN) 300 MG capsule, Take 1 capsule (300 mg total) by mouth at bedtime., Disp: 90 capsule, Rfl: 1   HYDROcodone-acetaminophen (NORCO/VICODIN) 5-325 MG tablet, Take 1 tablet by mouth every 6 (six) hours as needed for moderate pain., Disp: 30 tablet, Rfl: 0   methocarbamol (ROBAXIN) 500 MG tablet, Take 500 mg by mouth every 8 (eight) hours as needed., Disp: , Rfl:    metroNIDAZOLE (METROCREAM) 0.75 % cream, Apply 1 application topically 2 (two) times daily as needed (rosacea). , Disp: , Rfl:    Multiple Vitamin (MULTIVITAMIN WITH MINERALS) TABS tablet, Take 1 tablet by mouth daily. Centrum Silver, Disp: , Rfl:    nitroGLYCERIN (NITROSTAT) 0.4 MG SL tablet, Place 1 tablet (0.4 mg total) under the tongue every 5 (five) minutes as needed for chest pain., Disp: 90 tablet, Rfl: 3   omeprazole (PRILOSEC) 40 MG capsule, TAKE 1 CAPSULE BY MOUTH EVERY DAY, Disp: 90 capsule, Rfl: 1   potassium chloride (KLOR-CON) 10 MEQ tablet, Take 1 tablet (10 mEq total) by mouth daily., Disp: 90 tablet, Rfl: 3   sacubitril-valsartan (ENTRESTO) 49-51 MG, Take 1 tablet by mouth 2 (two) times daily., Disp: 60 tablet, Rfl: 11  Past Medical History: Past Medical History:  Diagnosis  Date   Adenomatous colon polyp    2012   Anxiety and depression    Arthritis    Osteoarthritis, Dr Maureen Ralphs   Cancer [facility])    skin - left foot excised   Chronic diastolic CHF (congestive heart failure) (HCC)    DDD (degenerative disc disease), cervical    Deaf, right    DVT (deep venous thrombosis) ([place])    RLE DVT 08/2004, post right knee arthroscopy   Fibromyalgia    Dr Tobie Lords, Rheu   GERD (gastroesophageal reflux disease)    H/O hiatal hernia    Headache(784.0)    PAST HX MILD MIGRAINES - resolved per patient 08/18/20   History of  kidney stones    passed stones   HTN (hypertension)    IBS (irritable bowel syndrome)    Internal hemorrhoid    Lumbar radiculopathy    Obesity    OSA on CPAP    Moderate with AHI 20/hr with nocturnal hypoxemia on CPAP   PAF (paroxysmal atrial fibrillation) ([place]) 06/2018   Pulmonary hypertension ([place])     Tobacco Use: Social History   Tobacco Use  Smoking Status Never  Smokeless Tobacco Never    Labs: Review Flowsheet  More data exists      Latest Ref Rng & Units 06/26/2018 10/12/2021 07/21/2022 10/10/2022 11/15/2022  Labs for ITP Cardiac and Pulmonary Rehab  Cholestrol 0 - 200 mg/dL - 142  - - 177   LDL (calc) 0 - 99 mg/dL - 71  - - 90   HDL-C >39.00 mg/dL - 51.00  - - 67.20   Trlycerides 0.0 - 149.0 mg/dL - 98.0  - - 97.0   Hemoglobin A1c 4.6 - 6.5 % 5.4  5.8  - - 5.7   Bicarbonate 20.0 - 28.0 mmol/L 20.0 - 28.0 mmol/L - - - 28.9  28.5  -  TCO2 22 - 32 mmol/L 22 - 32 mmol/L - - '29  30  30  '$ -  O2 Saturation % % - - - 57  55  -    Capillary Blood Glucose: Lab Results  Component Value Date   GLUCAP 74 07/27/2022   GLUCAP 93 07/27/2022   GLUCAP 113 (H) 06/25/2018     Pulmonary Assessment Scores:  Pulmonary Assessment Scores     Row Name 11/24/22 1410         ADL UCSD   SOB Score total 40       CAT Score   CAT Score 12       mMRC Score   mMRC Score 3             UCSD: Self-administered rating of dyspnea associated with activities of daily living (ADLs) 6-point scale (0 = "not at all" to 5 = "maximal or unable to do because of breathlessness")  Scoring Scores range from 0 to 120.  Minimally important difference is 5 units  CAT: CAT can identify the health impairment of COPD patients and is better correlated with disease progression.  CAT has a scoring range of zero to 40. The CAT score is classified into four groups of low (less than 10), medium (10 - 20), high (21-30) and very high (31-40) based on the impact level of disease on health status. A  CAT score over 10 suggests significant symptoms.  A worsening CAT score could be explained by an exacerbation, poor medication adherence, poor inhaler technique, or progression of COPD or comorbid conditions.  CAT MCID is 2 points  mMRC:  mMRC (Modified Medical Research Council) Dyspnea Scale is used to assess the degree of baseline functional disability in patients of respiratory disease due to dyspnea. No minimal important difference is established. A decrease in score of 1 point or greater is considered a positive change.   Pulmonary Function Assessment:  Pulmonary Function Assessment - 11/24/22 1410       Breath   Bilateral Breath Sounds Clear    Shortness of Breath No;Limiting activity             Exercise Target Goals: Exercise Program Goal: Individual exercise prescription set using results from initial 6 min walk test and THRR while considering  patient's activity barriers and safety.   Exercise Prescription Goal: Initial exercise prescription builds to 30-45 minutes a day of aerobic activity, 2-3 days per week.  Home exercise guidelines will be given to patient during program as part of exercise prescription that the participant will acknowledge.  Activity Barriers & Risk Stratification:  Activity Barriers & Cardiac Risk Stratification - 11/24/22 1408       Activity Barriers & Cardiac Risk Stratification   Activity Barriers Deconditioning;Muscular Weakness;Shortness of Breath;Back Problems;Fibromyalgia;History of Falls             6 Minute Walk:  6 Minute Walk     Row Name 11/24/22 1503         6 Minute Walk   Phase Initial     Distance 600 feet     Walk Time 6 minutes     # of Rest Breaks 1     MPH 1.14     METS 0.84     RPE 12     Perceived Dyspnea  2     VO2 Peak 2.94     Resting HR 64 bpm     Resting BP 136/72     Resting Oxygen Saturation  95 %     Exercise Oxygen Saturation  during 6 min walk 90 %     Max Ex. HR 91 bpm     Max Ex. BP 193/77      2 Minute Post BP 149/79       Interval HR   1 Minute HR 78     2 Minute HR 84     3 Minute HR 88     4 Minute HR 88     5 Minute HR 87     6 Minute HR 91     2 Minute Post HR 69     Interval Heart Rate? Yes              Oxygen Initial Assessment:  Oxygen Initial Assessment - 11/24/22 1409       Home Oxygen   Home Oxygen Device None    Home Exercise Oxygen Prescription None    Home Resting Oxygen Prescription None             Oxygen Re-Evaluation:  Oxygen Re-Evaluation     Row Name 11/24/22 1409             Program Oxygen Prescription   Program Oxygen Prescription None         Home Oxygen   Sleep Oxygen Prescription None       Compliance with Home Oxygen Use --         Goals/Expected Outcomes   Short Term Goals To learn and understand importance of maintaining oxygen saturations>88%;To learn and demonstrate proper pursed lip breathing techniques or other breathing techniques.  Long  Term Goals Maintenance of O2 saturations>88%;Exhibits proper breathing techniques, such as pursed lip breathing or other method taught during program session                Oxygen Discharge (Final Oxygen Re-Evaluation):  Oxygen Re-Evaluation - 11/24/22 1409       Program Oxygen Prescription   Program Oxygen Prescription None      Home Oxygen   Sleep Oxygen Prescription None    Compliance with Home Oxygen Use --      Goals/Expected Outcomes   Short Term Goals To learn and understand importance of maintaining oxygen saturations>88%;To learn and demonstrate proper pursed lip breathing techniques or other breathing techniques.     Long  Term Goals Maintenance of O2 saturations>88%;Exhibits proper breathing techniques, such as pursed lip breathing or other method taught during program session             Initial Exercise Prescription:  Initial Exercise Prescription - 11/24/22 1500       Date of Initial Exercise RX and Referring Provider   Date  11/24/22    Referring Provider Sabharwal    Expected Discharge Date 02/20/23      NuStep   Level 1    SPM 50    Minutes 30      Prescription Details   Frequency (times per week) 2    Duration Progress to 30 minutes of continuous aerobic without signs/symptoms of physical distress      Intensity   THRR 40-80% of Max Heartrate 60-120    Ratings of Perceived Exertion 11-13    Perceived Dyspnea 0-4      Progression   Progression Continue progressive overload as per policy without signs/symptoms or physical distress.      Resistance Training   Training Prescription Yes    Weight red bands    Reps 10-15             Perform Capillary Blood Glucose checks as needed.  Exercise Prescription Changes:   Exercise Comments:   Exercise Goals and Review:   Exercise Goals     Row Name 11/24/22 1409             Exercise Goals   Increase Physical Activity Yes       Intervention Provide advice, education, support and counseling about physical activity/exercise needs.;Develop an individualized exercise prescription for aerobic and resistive training based on initial evaluation findings, risk stratification, comorbidities and participant's personal goals.       Expected Outcomes Short Term: Attend rehab on a regular basis to increase amount of physical activity.;Long Term: Exercising regularly at least 3-5 days a week.;Long Term: Add in home exercise to make exercise part of routine and to increase amount of physical activity.       Increase Strength and Stamina Yes       Intervention Provide advice, education, support and counseling about physical activity/exercise needs.;Develop an individualized exercise prescription for aerobic and resistive training based on initial evaluation findings, risk stratification, comorbidities and participant's personal goals.       Expected Outcomes Short Term: Increase workloads from initial exercise prescription for resistance, speed, and  METs.;Short Term: Perform resistance training exercises routinely during rehab and add in resistance training at home;Long Term: Improve cardiorespiratory fitness, muscular endurance and strength as measured by increased METs and functional capacity (6MWT)       Able to understand and use rate of perceived exertion (RPE) scale Yes  Intervention Provide education and explanation on how to use RPE scale       Expected Outcomes Short Term: Able to use RPE daily in rehab to express subjective intensity level;Long Term:  Able to use RPE to guide intensity level when exercising independently       Able to understand and use Dyspnea scale Yes       Intervention Provide education and explanation on how to use Dyspnea scale       Expected Outcomes Short Term: Able to use Dyspnea scale daily in rehab to express subjective sense of shortness of breath during exertion;Long Term: Able to use Dyspnea scale to guide intensity level when exercising independently       Knowledge and understanding of Target Heart Rate Range (THRR) Yes       Intervention Provide education and explanation of THRR including how the numbers were predicted and where they are located for reference       Expected Outcomes Short Term: Able to state/look up THRR;Short Term: Able to use daily as guideline for intensity in rehab;Long Term: Able to use THRR to govern intensity when exercising independently       Understanding of Exercise Prescription Yes       Intervention Provide education, explanation, and written materials on patient's individual exercise prescription       Expected Outcomes Short Term: Able to explain program exercise prescription;Long Term: Able to explain home exercise prescription to exercise independently                Exercise Goals Re-Evaluation :   Discharge Exercise Prescription (Final Exercise Prescription Changes):   Nutrition:  Target Goals: Understanding of nutrition guidelines, daily intake of  sodium '1500mg'$ , cholesterol '200mg'$ , calories 30% from fat and 7% or less from saturated fats, daily to have 5 or more servings of fruits and vegetables.  Biometrics:    Nutrition Therapy Plan and Nutrition Goals:   Nutrition Assessments:  MEDIFICTS Score Key: ?70 Need to make dietary changes  40-70 Heart Healthy Diet ? 40 Therapeutic Level Cholesterol Diet   Picture Your Plate Scores: [id number] Unhealthy dietary pattern with much room for improvement. 41-50 Dietary pattern unlikely to meet recommendations for good health and room for improvement. 51-60 More healthful dietary pattern, with some room for improvement.  >60 Healthy dietary pattern, although there may be some specific behaviors that could be improved.    Nutrition Goals Re-Evaluation:   Nutrition Goals Discharge (Final Nutrition Goals Re-Evaluation):   Psychosocial: Target Goals: Acknowledge presence or absence of significant depression and/or stress, maximize coping skills, provide positive support system. Participant is able to verbalize types and ability to use techniques and skills needed for reducing stress and depression.  Initial Review & Psychosocial Screening:  Initial Psych Review & Screening - 11/24/22 1358       Initial Review   Current issues with Current Anxiety/Panic;Current Psychotropic Meds      Family Dynamics   Good Support System? Yes    Comments Sons      Screening Interventions   Interventions Encouraged to exercise             Quality of Life Scores:  Scores of 19 and below usually indicate a poorer quality of life in these areas.  A difference of  2-3 points is a clinically meaningful difference.  A difference of 2-3 points in the total score of the Quality of Life Index has been associated with significant improvement in overall quality of life,  self-image, physical symptoms, and general health in studies assessing change in quality of life.  PHQ-9: Review Flowsheet  More data  exists      11/24/2022 06/06/2022 05/09/2022 10/12/2021 07/12/2021  Depression screen PHQ 2/9  Decreased Interest 0 0 '1 1 2  '$ Down, Depressed, Hopeless 0 0 0 1 2  PHQ - 2 Score 0 0 '1 2 4  '$ Altered sleeping 0 0 '3 2 2  '$ Tired, decreased energy 0 0 '1 1 2  '$ Change in appetite 0 0 '3 3 2  '$ Feeling bad or failure about yourself  0 0 0 0 0  Trouble concentrating 0 0 0 0 1  Moving slowly or fidgety/restless 0 0 0 0 0  Suicidal thoughts 0 0 0 0 0  PHQ-9 Score 0 0 '8 8 11  '$ Difficult doing work/chores - Not difficult at all Not difficult at all Not difficult at all Not difficult at all   Interpretation of Total Score  Total Score Depression Severity:  1-4 = Minimal depression, 5-9 = Mild depression, 10-14 = Moderate depression, 15-19 = Moderately severe depression, 20-27 = Severe depression   Psychosocial Evaluation and Intervention:  Psychosocial Evaluation - 11/24/22 1359       Psychosocial Evaluation & Interventions   Interventions Encouraged to exercise with the program and follow exercise prescription    Comments Kathy [name] currently denies any psychosocial barriers or concerns.    Expected Outcomes For Scott to continue PR without any psychosocial barriers or concerns.    Continue Psychosocial Services  No Follow up required             Psychosocial Re-Evaluation:  Psychosocial Re-Evaluation     [place] Name 11/24/22 1510             Psychosocial Re-Evaluation   Current issues with Current Anxiety/Panic;Current Psychotropic Meds       Comments No change in psychosocial assessment since orientation 11/24/2022. No concerns identified       Expected Outcomes To attend PR without any psychosocial barriers or concerns       Interventions Relaxation education;Encouraged to attend Pulmonary Rehabilitation for the exercise       Continue Psychosocial Services  No Follow up required                Psychosocial Discharge (Final Psychosocial Re-Evaluation):  Psychosocial Re-Evaluation - 11/24/22  1510       Psychosocial Re-Evaluation   Current issues with Current Anxiety/Panic;Current Psychotropic Meds    Comments No change in psychosocial assessment since orientation 11/24/2022. No concerns identified    Expected Outcomes To attend PR without any psychosocial barriers or concerns    Interventions Relaxation education;Encouraged to attend Pulmonary Rehabilitation for the exercise    Continue Psychosocial Services  No Follow up required             Education: Education Goals: Education classes will be provided on a weekly basis, covering required topics. Participant will state understanding/return demonstration of topics presented.  Learning Barriers/Preferences:   Education Topics: Introduction to Pulmonary Rehab Group instruction provided by PowerPoint, verbal discussion, and written material to support subject matter. Instructor reviews what Pulmonary Rehab is, the purpose of the program, and how patients are referred.     Know Your Numbers Group instruction that is supported by a PowerPoint presentation. Instructor discusses importance of knowing and understanding resting, exercise, and post-exercise oxygen saturation, heart rate, and blood pressure. Oxygen saturation, heart rate, blood pressure, rating of perceived exertion, and dyspnea are reviewed  along with a normal range for these values.    Exercise for the Pulmonary Patient Group instruction that is supported by a PowerPoint presentation. Instructor discusses benefits of exercise, core components of exercise, frequency, duration, and intensity of an exercise routine, importance of utilizing pulse oximetry during exercise, safety while exercising, and options of places to exercise outside of rehab.    MET Level  Group instruction provided by PowerPoint, verbal discussion, and written material to support subject matter. Instructor reviews what METs are and how to increase METs.    Pulmonary Medications Verbally  interactive group education provided by instructor with focus on inhaled medications and proper administration.   Anatomy and Physiology of the Respiratory System Group instruction provided by PowerPoint, verbal discussion, and written material to support subject matter. Instructor reviews respiratory cycle and anatomical components of the respiratory system and their functions. Instructor also reviews differences in obstructive and restrictive respiratory diseases with examples of each.    Oxygen Safety Group instruction provided by PowerPoint, verbal discussion, and written material to support subject matter. There is an overview of "What is Oxygen" and "Why do we need it".  Instructor also reviews how to create a safe environment for oxygen use, the importance of using oxygen as prescribed, and the risks of noncompliance. There is a brief discussion on traveling with oxygen and resources the patient may utilize.   Oxygen Use Group instruction provided by PowerPoint, verbal discussion, and written material to discuss how supplemental oxygen is prescribed and different types of oxygen supply systems. Resources for more information are provided.    Breathing Techniques Group instruction that is supported by demonstration and informational handouts. Instructor discusses the benefits of pursed lip and diaphragmatic breathing and detailed demonstration on how to perform both.     Risk Factor Reduction Group instruction that is supported by a PowerPoint presentation. Instructor discusses the definition of a risk factor, different risk factors for pulmonary disease, and how the heart and lungs work together.   MD Day A group question and answer session with a medical doctor that allows participants to ask questions that relate to their pulmonary disease state.   Nutrition for the Pulmonary Patient Group instruction provided by PowerPoint slides, verbal discussion, and written materials to  support subject matter. The instructor gives an explanation and review of healthy diet recommendations, which includes a discussion on weight management, recommendations for fruit and vegetable consumption, as well as protein, fluid, caffeine, fiber, sodium, sugar, and alcohol. Tips for eating when patients are short of breath are discussed.    Other Education Group or individual verbal, written, or video instructions that support the educational goals of the pulmonary rehab program.    Knowledge Questionnaire Score:  Knowledge Questionnaire Score - 11/24/22 1416       Knowledge Questionnaire Score   Pre Score 14/18             Core Components/Risk Factors/Patient Goals at Admission:  Personal Goals and Risk Factors at Admission - 11/24/22 1359       Core Components/Risk Factors/Patient Goals on Admission    Weight Management Weight Loss    Improve shortness of breath with ADL's Yes    Intervention Provide education, individualized exercise plan and daily activity instruction to help decrease symptoms of SOB with activities of daily living.    Expected Outcomes Short Term: Improve cardiorespiratory fitness to achieve a reduction of symptoms when performing ADLs;Long Term: Be able to perform more ADLs without symptoms or delay the  onset of symptoms             Core Components/Risk Factors/Patient Goals Review:   Goals and Risk Factor Review     Row Name 11/24/22 1512             Core Components/Risk Factors/Patient Goals Review   Personal Goals Review Weight Management/Obesity;Heart Failure;Develop more efficient breathing techniques such as purse lipped breathing and diaphragmatic breathing and practicing self-pacing with activity.       Review Kathy [name] has not yet started the PR program. She is scheduled to start next Thursday 11/30/22       Expected Outcomes See admission goals                Core Components/Risk Factors/Patient Goals at Discharge (Final Review):    Goals and Risk Factor Review - 11/24/22 1512       Core Components/Risk Factors/Patient Goals Review   Personal Goals Review Weight Management/Obesity;Heart Failure;Develop more efficient breathing techniques such as purse lipped breathing and diaphragmatic breathing and practicing self-pacing with activity.    Review Kathy [name] has not yet started the PR program. She is scheduled to start next Thursday 11/30/22    Expected Outcomes See admission goals             ITP Comments:   Comments: Dr. Rodman Pickle is Medical Director for Pulmonary Rehab at [facility].

## 2022-11-24 NOTE — Progress Notes (Signed)
[name] "Kathy [name]" 72 y.o. female Pulmonary Rehab Orientation Note This patient who was referred to Pulmonary Rehab by Dr. Daniel Nones with the diagnosis of Chronic Diastolic CHF arrived today in Cardiac and Pulmonary Rehab. She  arrived ambulatory with assistive device with normal gait. She  does not carry portable oxygen.   Color good, skin warm and dry. Patient is oriented to time and place. Patient's medical history, psychosocial health, and medications reviewed. Psychosocial assessment reveals patient lives with alone. Kathy [name] is currently retired. Patient hobbies include watching tv, spending time with others, and reading. Patient reports her stress level is low. Areas of stress/anxiety include  deconditioning . Patient does not exhibit signs of depression. PHQ2/9 score 0/0. Kathy [name] shows good  coping skills with positive outlook on life. Will continue to monitor and evaluate progress toward psychosocial goal(s) of decreased anxiety/stress.  Physical assessment reveals patient is alert and oriented x 4. Pt reports non-productive cough. Bowel sounds present x4 quads. Pt denies abdominal discomfort, nausea, vomiting or diarrhea. Grip strength equal, strong. Distal pulses +2; +1 swelling to lower extremities. Kathy [name] reports she does take medications as prescribed. Patient states she follows a regular  diet. The patient has been trying to lose weight through a healthy diet and exercise program.. Pt's weight will be monitored closely.   Demonstration and practice of PLB using pulse oximeter. Kathy [name] is able to return demonstration satisfactorily. Safety and hand hygiene in the exercise area reviewed with patient. Kathy [name] voices understanding of the information reviewed. Department expectations discussed with patient and achievable goals were set. The patient shows enthusiasm about attending the program and we look forward to working with Kathy [name]. Kathy [name] completed a 6 min walk test today and is scheduled to begin exercise on  11/30/2022 at 1315.   [id number] Janine Ores, RN, BSN

## 2022-11-28 ENCOUNTER — Other Ambulatory Visit: Payer: Self-pay | Admitting: Internal Medicine

## 2022-11-28 DIAGNOSIS — F419 Anxiety disorder, unspecified: Secondary | ICD-10-CM

## 2022-11-29 NOTE — Progress Notes (Signed)
Pulmonary Individual Treatment Plan  Patient Details  Name: Kathy [name] MRN: [id number] Date of Birth: [birth year] Referring Provider:   April Manson Pulmonary Rehab Walk Test from 11/24/2022 in [facility] for Heart, Vascular, & [place]  Referring Provider Sabharwal       Initial Encounter Date:  Flowsheet Row Pulmonary Rehab Walk Test from 11/24/2022 in [facility] for Heart, Vascular, & [place]  Date 11/24/22       Visit Diagnosis: Chronic diastolic CHF (congestive heart failure) ([place])  Patient's Home Medications on Admission:   Current Outpatient Medications:    ALPRAZolam (XANAX) 1 MG tablet, TAKE 1 TABLET BY MOUTH THREE TIMES A DAY AS NEEDED FOR ANXIETY, Disp: 60 tablet, Rfl: 0   amiodarone (PACERONE) 200 MG tablet, Take 1 tablet (200 mg total) by mouth 2 (two) times daily. Please cancel all previous orders for current medication. Change in dosage or pill size., Disp: 180 tablet, Rfl: 3   apixaban (ELIQUIS) 5 MG TABS tablet, Take 1 tablet (5 mg total) by mouth 2 (two) times daily., Disp: 56 tablet, Rfl: 0   atorvastatin (LIPITOR) 40 MG tablet, TAKE 1 TABLET BY MOUTH EVERY DAY, Disp: 90 tablet, Rfl: 3   Calcium Carb-Cholecalciferol (CALCIUM 600 + D PO), Take 1 tablet by mouth daily., Disp: , Rfl:    cholecalciferol (VITAMIN D3) 25 MCG (1000 UNIT) tablet, Take 1,000 Units by mouth once a week. For 12 weeks, Disp: , Rfl:    clindamycin (CLEOCIN T) 1 % lotion, Apply 1 application  topically 2 (two) times daily as needed (rosacea)., Disp: , Rfl:    clobetasol (TEMOVATE) 0.05 % external solution, Apply 1 application topically daily as needed (rosacea)., Disp: , Rfl:    cyclobenzaprine (FLEXERIL) 5 MG tablet, Take 5 mg by mouth at bedtime as needed for muscle spasms., Disp: , Rfl:    fluocinonide (LIDEX) 0.05 % external solution, Apply 1 application topically daily as needed (rosacea)., Disp: , Rfl:    FLUoxetine (PROZAC) 40  MG capsule, TAKE 1 CAPSULE BY MOUTH EVERY DAY, Disp: 90 capsule, Rfl: 0   furosemide (LASIX) 40 MG tablet, Take 1 tablet (40 mg total) by mouth daily., Disp: 90 tablet, Rfl: 1   gabapentin (NEURONTIN) 300 MG capsule, Take 1 capsule (300 mg total) by mouth at bedtime., Disp: 90 capsule, Rfl: 1   HYDROcodone-acetaminophen (NORCO/VICODIN) 5-325 MG tablet, Take 1 tablet by mouth every 6 (six) hours as needed for moderate pain., Disp: 30 tablet, Rfl: 0   methocarbamol (ROBAXIN) 500 MG tablet, Take 500 mg by mouth every 8 (eight) hours as needed., Disp: , Rfl:    metroNIDAZOLE (METROCREAM) 0.75 % cream, Apply 1 application topically 2 (two) times daily as needed (rosacea). , Disp: , Rfl:    Multiple Vitamin (MULTIVITAMIN WITH MINERALS) TABS tablet, Take 1 tablet by mouth daily. Centrum Silver, Disp: , Rfl:    nitroGLYCERIN (NITROSTAT) 0.4 MG SL tablet, Place 1 tablet (0.4 mg total) under the tongue every 5 (five) minutes as needed for chest pain., Disp: 90 tablet, Rfl: 3   omeprazole (PRILOSEC) 40 MG capsule, TAKE 1 CAPSULE BY MOUTH EVERY DAY, Disp: 90 capsule, Rfl: 1   potassium chloride (KLOR-CON) 10 MEQ tablet, Take 1 tablet (10 mEq total) by mouth daily., Disp: 90 tablet, Rfl: 3   sacubitril-valsartan (ENTRESTO) 49-51 MG, Take 1 tablet by mouth 2 (two) times daily., Disp: 60 tablet, Rfl: 11  Past Medical History: Past Medical History:  Diagnosis  Date   Adenomatous colon polyp    2012   Anxiety and depression    Arthritis    Osteoarthritis, Dr Maureen Ralphs   Cancer [facility])    skin - left foot excised   Chronic diastolic CHF (congestive heart failure) (HCC)    DDD (degenerative disc disease), cervical    Deaf, right    DVT (deep venous thrombosis) ([place])    RLE DVT 08/2004, post right knee arthroscopy   Fibromyalgia    Dr Tobie Lords, Rheu   GERD (gastroesophageal reflux disease)    H/O hiatal hernia    Headache(784.0)    PAST HX MILD MIGRAINES - resolved per patient 08/18/20   History of  kidney stones    passed stones   HTN (hypertension)    IBS (irritable bowel syndrome)    Internal hemorrhoid    Lumbar radiculopathy    Obesity    OSA on CPAP    Moderate with AHI 20/hr with nocturnal hypoxemia on CPAP   PAF (paroxysmal atrial fibrillation) ([place]) 06/2018   Pulmonary hypertension ([place])     Tobacco Use: Social History   Tobacco Use  Smoking Status Never  Smokeless Tobacco Never    Labs: Review Flowsheet  More data exists      Latest Ref Rng & Units 06/26/2018 10/12/2021 07/21/2022 10/10/2022 11/15/2022  Labs for ITP Cardiac and Pulmonary Rehab  Cholestrol 0 - 200 mg/dL - 142  - - 177   LDL (calc) 0 - 99 mg/dL - 71  - - 90   HDL-C >39.00 mg/dL - 51.00  - - 67.20   Trlycerides 0.0 - 149.0 mg/dL - 98.0  - - 97.0   Hemoglobin A1c 4.6 - 6.5 % 5.4  5.8  - - 5.7   Bicarbonate 20.0 - 28.0 mmol/L 20.0 - 28.0 mmol/L - - - 28.9  28.5  -  TCO2 22 - 32 mmol/L 22 - 32 mmol/L - - '29  30  30  '$ -  O2 Saturation % % - - - 57  55  -    Capillary Blood Glucose: Lab Results  Component Value Date   GLUCAP 74 07/27/2022   GLUCAP 93 07/27/2022   GLUCAP 113 (H) 06/25/2018     Pulmonary Assessment Scores:  Pulmonary Assessment Scores     Row Name 11/24/22 1410         ADL UCSD   SOB Score total 40       CAT Score   CAT Score 12       mMRC Score   mMRC Score 3             UCSD: Self-administered rating of dyspnea associated with activities of daily living (ADLs) 6-point scale (0 = "not at all" to 5 = "maximal or unable to do because of breathlessness")  Scoring Scores range from 0 to 120.  Minimally important difference is 5 units  CAT: CAT can identify the health impairment of COPD patients and is better correlated with disease progression.  CAT has a scoring range of zero to 40. The CAT score is classified into four groups of low (less than 10), medium (10 - 20), high (21-30) and very high (31-40) based on the impact level of disease on health status. A  CAT score over 10 suggests significant symptoms.  A worsening CAT score could be explained by an exacerbation, poor medication adherence, poor inhaler technique, or progression of COPD or comorbid conditions.  CAT MCID is 2 points  mMRC:  mMRC (Modified Medical Research Council) Dyspnea Scale is used to assess the degree of baseline functional disability in patients of respiratory disease due to dyspnea. No minimal important difference is established. A decrease in score of 1 point or greater is considered a positive change.   Pulmonary Function Assessment:  Pulmonary Function Assessment - 11/24/22 1410       Breath   Bilateral Breath Sounds Clear    Shortness of Breath No;Limiting activity             Exercise Target Goals: Exercise Program Goal: Individual exercise prescription set using results from initial 6 min walk test and THRR while considering  patient's activity barriers and safety.   Exercise Prescription Goal: Initial exercise prescription builds to 30-45 minutes a day of aerobic activity, 2-3 days per week.  Home exercise guidelines will be given to patient during program as part of exercise prescription that the participant will acknowledge.  Activity Barriers & Risk Stratification:  Activity Barriers & Cardiac Risk Stratification - 11/24/22 1408       Activity Barriers & Cardiac Risk Stratification   Activity Barriers Deconditioning;Muscular Weakness;Shortness of Breath;Back Problems;Fibromyalgia;History of Falls             6 Minute Walk:  6 Minute Walk     Row Name 11/24/22 1503         6 Minute Walk   Phase Initial     Distance 600 feet     Walk Time 6 minutes     # of Rest Breaks 1     MPH 1.14     METS 0.84     RPE 12     Perceived Dyspnea  2     VO2 Peak 2.94     Resting HR 64 bpm     Resting BP 136/72     Resting Oxygen Saturation  95 %     Exercise Oxygen Saturation  during 6 min walk 90 %     Max Ex. HR 91 bpm     Max Ex. BP 193/77      2 Minute Post BP 149/79       Interval HR   1 Minute HR 78     2 Minute HR 84     3 Minute HR 88     4 Minute HR 88     5 Minute HR 87     6 Minute HR 91     2 Minute Post HR 69     Interval Heart Rate? Yes              Oxygen Initial Assessment:  Oxygen Initial Assessment - 11/24/22 1409       Home Oxygen   Home Oxygen Device None    Home Exercise Oxygen Prescription None    Home Resting Oxygen Prescription None             Oxygen Re-Evaluation:  Oxygen Re-Evaluation     Row Name 11/24/22 1409 11/28/22 0919           Program Oxygen Prescription   Program Oxygen Prescription None None        Home Oxygen   Home Oxygen Device -- None      Sleep Oxygen Prescription None None      Home Exercise Oxygen Prescription -- None      Home Resting Oxygen Prescription -- None      Compliance with Home Oxygen Use -- --  Goals/Expected Outcomes   Short Term Goals To learn and understand importance of maintaining oxygen saturations>88%;To learn and demonstrate proper pursed lip breathing techniques or other breathing techniques.  To learn and understand importance of maintaining oxygen saturations>88%;To learn and demonstrate proper pursed lip breathing techniques or other breathing techniques.       Long  Term Goals Maintenance of O2 saturations>88%;Exhibits proper breathing techniques, such as pursed lip breathing or other method taught during program session Maintenance of O2 saturations>88%;Exhibits proper breathing techniques, such as pursed lip breathing or other method taught during program session      Goals/Expected Outcomes -- Compliance and understanding of oxygen saturation monitoring and breathing techniques to decrease shortness of breath               Oxygen Discharge (Final Oxygen Re-Evaluation):  Oxygen Re-Evaluation - 11/28/22 0919       Program Oxygen Prescription   Program Oxygen Prescription None      Home Oxygen   Home Oxygen  Device None    Sleep Oxygen Prescription None    Home Exercise Oxygen Prescription None    Home Resting Oxygen Prescription None      Goals/Expected Outcomes   Short Term Goals To learn and understand importance of maintaining oxygen saturations>88%;To learn and demonstrate proper pursed lip breathing techniques or other breathing techniques.     Long  Term Goals Maintenance of O2 saturations>88%;Exhibits proper breathing techniques, such as pursed lip breathing or other method taught during program session    Goals/Expected Outcomes Compliance and understanding of oxygen saturation monitoring and breathing techniques to decrease shortness of breath             Initial Exercise Prescription:  Initial Exercise Prescription - 11/24/22 1500       Date of Initial Exercise RX and Referring Provider   Date 11/24/22    Referring Provider Sabharwal    Expected Discharge Date 02/20/23      NuStep   Level 1    SPM 50    Minutes 30      Prescription Details   Frequency (times per week) 2    Duration Progress to 30 minutes of continuous aerobic without signs/symptoms of physical distress      Intensity   THRR 40-80% of Max Heartrate 60-120    Ratings of Perceived Exertion 11-13    Perceived Dyspnea 0-4      Progression   Progression Continue progressive overload as per policy without signs/symptoms or physical distress.      Resistance Training   Training Prescription Yes    Weight red bands    Reps 10-15             Perform Capillary Blood Glucose checks as needed.  Exercise Prescription Changes:   Exercise Comments:   Exercise Goals and Review:   Exercise Goals     Row Name 11/24/22 1409 11/28/22 0918           Exercise Goals   Increase Physical Activity Yes Yes      Intervention Provide advice, education, support and counseling about physical activity/exercise needs.;Develop an individualized exercise prescription for aerobic and resistive training based  on initial evaluation findings, risk stratification, comorbidities and participant's personal goals. Provide advice, education, support and counseling about physical activity/exercise needs.;Develop an individualized exercise prescription for aerobic and resistive training based on initial evaluation findings, risk stratification, comorbidities and participant's personal goals.      Expected Outcomes Short Term: Attend rehab on a  regular basis to increase amount of physical activity.;Long Term: Exercising regularly at least 3-5 days a week.;Long Term: Add in home exercise to make exercise part of routine and to increase amount of physical activity. Short Term: Attend rehab on a regular basis to increase amount of physical activity.;Long Term: Exercising regularly at least 3-5 days a week.;Long Term: Add in home exercise to make exercise part of routine and to increase amount of physical activity.      Increase Strength and Stamina Yes Yes      Intervention Provide advice, education, support and counseling about physical activity/exercise needs.;Develop an individualized exercise prescription for aerobic and resistive training based on initial evaluation findings, risk stratification, comorbidities and participant's personal goals. Provide advice, education, support and counseling about physical activity/exercise needs.;Develop an individualized exercise prescription for aerobic and resistive training based on initial evaluation findings, risk stratification, comorbidities and participant's personal goals.      Expected Outcomes Short Term: Increase workloads from initial exercise prescription for resistance, speed, and METs.;Short Term: Perform resistance training exercises routinely during rehab and add in resistance training at home;Long Term: Improve cardiorespiratory fitness, muscular endurance and strength as measured by increased METs and functional capacity (6MWT) Short Term: Increase workloads from  initial exercise prescription for resistance, speed, and METs.;Short Term: Perform resistance training exercises routinely during rehab and add in resistance training at home;Long Term: Improve cardiorespiratory fitness, muscular endurance and strength as measured by increased METs and functional capacity (6MWT)      Able to understand and use rate of perceived exertion (RPE) scale Yes Yes      Intervention Provide education and explanation on how to use RPE scale Provide education and explanation on how to use RPE scale      Expected Outcomes Short Term: Able to use RPE daily in rehab to express subjective intensity level;Long Term:  Able to use RPE to guide intensity level when exercising independently Short Term: Able to use RPE daily in rehab to express subjective intensity level;Long Term:  Able to use RPE to guide intensity level when exercising independently      Able to understand and use Dyspnea scale Yes Yes      Intervention Provide education and explanation on how to use Dyspnea scale Provide education and explanation on how to use Dyspnea scale      Expected Outcomes Short Term: Able to use Dyspnea scale daily in rehab to express subjective sense of shortness of breath during exertion;Long Term: Able to use Dyspnea scale to guide intensity level when exercising independently Short Term: Able to use Dyspnea scale daily in rehab to express subjective sense of shortness of breath during exertion;Long Term: Able to use Dyspnea scale to guide intensity level when exercising independently      Knowledge and understanding of Target Heart Rate Range (THRR) Yes Yes      Intervention Provide education and explanation of THRR including how the numbers were predicted and where they are located for reference Provide education and explanation of THRR including how the numbers were predicted and where they are located for reference      Expected Outcomes Short Term: Able to state/look up THRR;Short Term: Able  to use daily as guideline for intensity in rehab;Long Term: Able to use THRR to govern intensity when exercising independently Short Term: Able to state/look up THRR;Short Term: Able to use daily as guideline for intensity in rehab;Long Term: Able to use THRR to govern intensity when exercising independently  Understanding of Exercise Prescription Yes Yes      Intervention Provide education, explanation, and written materials on patient's individual exercise prescription Provide education, explanation, and written materials on patient's individual exercise prescription      Expected Outcomes Short Term: Able to explain program exercise prescription;Long Term: Able to explain home exercise prescription to exercise independently Short Term: Able to explain program exercise prescription;Long Term: Able to explain home exercise prescription to exercise independently               Exercise Goals Re-Evaluation :  Exercise Goals Re-Evaluation     Row Name 11/28/22 0918             Exercise Goal Re-Evaluation   Exercise Goals Review Increase Physical Activity;Able to understand and use Dyspnea scale;Understanding of Exercise Prescription;Increase Strength and Stamina;Knowledge and understanding of Target Heart Rate Range (THRR);Able to understand and use rate of perceived exertion (RPE) scale       Comments Pt will begin exercise on 3/14. Will monitor for progression.       Expected Outcomes Through exercise at rehab and home the patient will decrease shortness of breath with daily activities and feel confident in carrying out an exercise regimn at home                Discharge Exercise Prescription (Final Exercise Prescription Changes):   Nutrition:  Target Goals: Understanding of nutrition guidelines, daily intake of sodium '1500mg'$ , cholesterol '200mg'$ , calories 30% from fat and 7% or less from saturated fats, daily to have 5 or more servings of fruits and  vegetables.  Biometrics:    Nutrition Therapy Plan and Nutrition Goals:   Nutrition Assessments:  MEDIFICTS Score Key: ?70 Need to make dietary changes  40-70 Heart Healthy Diet ? 40 Therapeutic Level Cholesterol Diet   Picture Your Plate Scores: [id number] Unhealthy dietary pattern with much room for improvement. 41-50 Dietary pattern unlikely to meet recommendations for good health and room for improvement. 51-60 More healthful dietary pattern, with some room for improvement.  >60 Healthy dietary pattern, although there may be some specific behaviors that could be improved.    Nutrition Goals Re-Evaluation:   Nutrition Goals Discharge (Final Nutrition Goals Re-Evaluation):   Psychosocial: Target Goals: Acknowledge presence or absence of significant depression and/or stress, maximize coping skills, provide positive support system. Participant is able to verbalize types and ability to use techniques and skills needed for reducing stress and depression.  Initial Review & Psychosocial Screening:  Initial Psych Review & Screening - 11/24/22 1358       Initial Review   Current issues with Current Anxiety/Panic;Current Psychotropic Meds      Family Dynamics   Good Support System? Yes    Comments Sons      Screening Interventions   Interventions Encouraged to exercise             Quality of Life Scores:  Scores of 19 and below usually indicate a poorer quality of life in these areas.  A difference of  2-3 points is a clinically meaningful difference.  A difference of 2-3 points in the total score of the Quality of Life Index has been associated with significant improvement in overall quality of life, self-image, physical symptoms, and general health in studies assessing change in quality of life.  PHQ-9: Review Flowsheet  More data exists      11/24/2022 06/06/2022 05/09/2022 10/12/2021 07/12/2021  Depression screen PHQ 2/9  Decreased Interest 0 0 1 1 2  Down, Depressed,  Hopeless 0 0 0 1 2  PHQ - 2 Score 0 0 '1 2 4  '$ Altered sleeping 0 0 '3 2 2  '$ Tired, decreased energy 0 0 '1 1 2  '$ Change in appetite 0 0 '3 3 2  '$ Feeling bad or failure about yourself  0 0 0 0 0  Trouble concentrating 0 0 0 0 1  Moving slowly or fidgety/restless 0 0 0 0 0  Suicidal thoughts 0 0 0 0 0  PHQ-9 Score 0 0 '8 8 11  '$ Difficult doing work/chores - Not difficult at all Not difficult at all Not difficult at all Not difficult at all   Interpretation of Total Score  Total Score Depression Severity:  1-4 = Minimal depression, 5-9 = Mild depression, 10-14 = Moderate depression, 15-19 = Moderately severe depression, 20-27 = Severe depression   Psychosocial Evaluation and Intervention:  Psychosocial Evaluation - 11/24/22 1359       Psychosocial Evaluation & Interventions   Interventions Encouraged to exercise with the program and follow exercise prescription    Comments Zigmund Daniel currently denies any psychosocial barriers or concerns.    Expected Outcomes For Scott to continue PR without any psychosocial barriers or concerns.    Continue Psychosocial Services  No Follow up required             Psychosocial Re-Evaluation:  Psychosocial Re-Evaluation     [place] Name 11/24/22 1510             Psychosocial Re-Evaluation   Current issues with Current Anxiety/Panic;Current Psychotropic Meds       Comments No change in psychosocial assessment since orientation 11/24/2022. No concerns identified       Expected Outcomes To attend PR without any psychosocial barriers or concerns       Interventions Relaxation education;Encouraged to attend Pulmonary Rehabilitation for the exercise       Continue Psychosocial Services  No Follow up required                Psychosocial Discharge (Final Psychosocial Re-Evaluation):  Psychosocial Re-Evaluation - 11/24/22 1510       Psychosocial Re-Evaluation   Current issues with Current Anxiety/Panic;Current Psychotropic Meds    Comments No change in  psychosocial assessment since orientation 11/24/2022. No concerns identified    Expected Outcomes To attend PR without any psychosocial barriers or concerns    Interventions Relaxation education;Encouraged to attend Pulmonary Rehabilitation for the exercise    Continue Psychosocial Services  No Follow up required             Education: Education Goals: Education classes will be provided on a weekly basis, covering required topics. Participant will state understanding/return demonstration of topics presented.  Learning Barriers/Preferences:   Education Topics: Introduction to Pulmonary Rehab Group instruction provided by PowerPoint, verbal discussion, and written material to support subject matter. Instructor reviews what Pulmonary Rehab is, the purpose of the program, and how patients are referred.     Know Your Numbers Group instruction that is supported by a PowerPoint presentation. Instructor discusses importance of knowing and understanding resting, exercise, and post-exercise oxygen saturation, heart rate, and blood pressure. Oxygen saturation, heart rate, blood pressure, rating of perceived exertion, and dyspnea are reviewed along with a normal range for these values.    Exercise for the Pulmonary Patient Group instruction that is supported by a PowerPoint presentation. Instructor discusses benefits of exercise, core components of exercise, frequency, duration, and intensity of an exercise routine, importance of utilizing pulse  oximetry during exercise, safety while exercising, and options of places to exercise outside of rehab.    MET Level  Group instruction provided by PowerPoint, verbal discussion, and written material to support subject matter. Instructor reviews what METs are and how to increase METs.    Pulmonary Medications Verbally interactive group education provided by instructor with focus on inhaled medications and proper administration.   Anatomy and Physiology  of the Respiratory System Group instruction provided by PowerPoint, verbal discussion, and written material to support subject matter. Instructor reviews respiratory cycle and anatomical components of the respiratory system and their functions. Instructor also reviews differences in obstructive and restrictive respiratory diseases with examples of each.    Oxygen Safety Group instruction provided by PowerPoint, verbal discussion, and written material to support subject matter. There is an overview of "What is Oxygen" and "Why do we need it".  Instructor also reviews how to create a safe environment for oxygen use, the importance of using oxygen as prescribed, and the risks of noncompliance. There is a brief discussion on traveling with oxygen and resources the patient may utilize.   Oxygen Use Group instruction provided by PowerPoint, verbal discussion, and written material to discuss how supplemental oxygen is prescribed and different types of oxygen supply systems. Resources for more information are provided.    Breathing Techniques Group instruction that is supported by demonstration and informational handouts. Instructor discusses the benefits of pursed lip and diaphragmatic breathing and detailed demonstration on how to perform both.     Risk Factor Reduction Group instruction that is supported by a PowerPoint presentation. Instructor discusses the definition of a risk factor, different risk factors for pulmonary disease, and how the heart and lungs work together.   MD Day A group question and answer session with a medical doctor that allows participants to ask questions that relate to their pulmonary disease state.   Nutrition for the Pulmonary Patient Group instruction provided by PowerPoint slides, verbal discussion, and written materials to support subject matter. The instructor gives an explanation and review of healthy diet recommendations, which includes a discussion on weight  management, recommendations for fruit and vegetable consumption, as well as protein, fluid, caffeine, fiber, sodium, sugar, and alcohol. Tips for eating when patients are short of breath are discussed.    Other Education Group or individual verbal, written, or video instructions that support the educational goals of the pulmonary rehab program.    Knowledge Questionnaire Score:  Knowledge Questionnaire Score - 11/24/22 1416       Knowledge Questionnaire Score   Pre Score 14/18             Core Components/Risk Factors/Patient Goals at Admission:  Personal Goals and Risk Factors at Admission - 11/24/22 1359       Core Components/Risk Factors/Patient Goals on Admission    Weight Management Weight Loss    Improve shortness of breath with ADL's Yes    Intervention Provide education, individualized exercise plan and daily activity instruction to help decrease symptoms of SOB with activities of daily living.    Expected Outcomes Short Term: Improve cardiorespiratory fitness to achieve a reduction of symptoms when performing ADLs;Long Term: Be able to perform more ADLs without symptoms or delay the onset of symptoms             Core Components/Risk Factors/Patient Goals Review:   Goals and Risk Factor Review     Row Name 11/24/22 [phone]  Core Components/Risk Factors/Patient Goals Review   Personal Goals Review Weight Management/Obesity;Heart Failure;Develop more efficient breathing techniques such as purse lipped breathing and diaphragmatic breathing and practicing self-pacing with activity.       Review Zigmund Daniel has not yet started the PR program. She is scheduled to start next Thursday 11/30/22       Expected Outcomes See admission goals                Core Components/Risk Factors/Patient Goals at Discharge (Final Review):   Goals and Risk Factor Review - 11/24/22 1512       Core Components/Risk Factors/Patient Goals Review   Personal Goals Review Weight  Management/Obesity;Heart Failure;Develop more efficient breathing techniques such as purse lipped breathing and diaphragmatic breathing and practicing self-pacing with activity.    Review Zigmund Daniel has not yet started the PR program. She is scheduled to start next Thursday 11/30/22    Expected Outcomes See admission goals             ITP Comments:   Comments: Dr. Rodman Pickle is Medical Director for Pulmonary Rehab at [facility].

## 2022-11-30 ENCOUNTER — Encounter (HOSPITAL_COMMUNITY)
Admission: RE | Admit: 2022-11-30 | Discharge: 2022-11-30 | Disposition: A | Discharge status: Home / Self Care | Payer: Medicare Other | Source: Ambulatory Visit | Attending: Cardiology | Admitting: Cardiology

## 2022-11-30 ENCOUNTER — Other Ambulatory Visit: Payer: Self-pay | Admitting: Cardiovascular Disease

## 2022-11-30 ENCOUNTER — Other Ambulatory Visit: Payer: Self-pay | Admitting: Internal Medicine

## 2022-11-30 DIAGNOSIS — I480 Paroxysmal atrial fibrillation: Secondary | ICD-10-CM

## 2022-11-30 DIAGNOSIS — I5032 Chronic diastolic (congestive) heart failure: Secondary | ICD-10-CM | POA: Diagnosis not present

## 2022-11-30 NOTE — Progress Notes (Signed)
Daily Session Note  Patient Details  Name: Kathy [name] MRN: [id number] Date of Birth: 08-22-1951 Referring Provider:   April Manson Pulmonary Rehab Walk Test from 11/24/2022 in [facility], V[facility], & [facility]  Referring Provider Sabharwal       Encounter Date: 11/30/2022  Check In:  Session Check In - 11/30/22 1411       Check-In   Supervising physician immediately available to respond to emergencies [facility] MD immediately available    Physician(s) Dr. Veneda Melter    Location [facility]    Staff Present Janine Ores, RN, Quentin Ore, MS, ACSM-CEP, Exercise Physiologist;Other;Randi Yevonne Pax, ACSM-CEP, Exercise Physiologist    Virtual Visit No    Medication changes reported     No    Fall or balance concerns reported    Yes    Comments Pt uses cane    Tobacco Cessation No Change    Warm-up and Cool-down Not performed (comment)   Pt here for orientation   Resistance Training Performed No    VAD Patient? No    PAD/SET Patient? No      Pain Assessment   Currently in Pain? No/denies    Multiple Pain Sites No             Capillary Blood Glucose: No results found for this or any previous visit (from the past 24 hour(s)).    Social History   Tobacco Use  Smoking Status Never  Smokeless Tobacco Never    Goals Met:  Exercise tolerated well No report of concerns or symptoms today Strength training completed today  Goals Unmet:  Not Applicable  Comments: Service time is from 1310 to Hundred    Dr. Rodman Pickle is Medical Director for Pulmonary Rehab at [facility].

## 2022-11-30 NOTE — Telephone Encounter (Signed)
Prescription refill request for Eliquis received. Indication: Afib  Last office visit: 11/17/22 (Sabharwal)  Scr: 1.18 (10/13/22)  Age: 72 Weight: 135kg  Appropriate dose. Refill sent.

## 2022-12-01 NOTE — Telephone Encounter (Signed)
Pt is aware md was out sick and should be back in office on monday

## 2022-12-04 LAB — HM DEXA SCAN

## 2022-12-05 ENCOUNTER — Encounter (HOSPITAL_COMMUNITY)
Admission: RE | Admit: 2022-12-05 | Discharge: 2022-12-05 | Disposition: A | Discharge status: Home / Self Care | Payer: Medicare Other | Source: Ambulatory Visit | Attending: Cardiology | Admitting: Cardiology

## 2022-12-05 ENCOUNTER — Ambulatory Visit: Payer: Medicare Other | Admitting: Cardiovascular Disease

## 2022-12-05 VITALS — Wt 296.5 lb

## 2022-12-05 DIAGNOSIS — I5032 Chronic diastolic (congestive) heart failure: Secondary | ICD-10-CM

## 2022-12-05 NOTE — Progress Notes (Signed)
Daily Session Note  Patient Details  Name: Kathy [name] MRN: [id number] Date of Birth: [birth year] Referring Provider:   April Manson Pulmonary Rehab Walk Test from 11/24/2022 in [facility], Vascular, & [facility]  Referring Provider Sabharwal       Encounter Date: 12/05/2022  Check In:  Session Check In - 12/05/22 1531       Check-In   Supervising physician immediately available to respond to emergencies [facility] MD immediately available    Physician(s) Eric Form, NP    Location [facility]    Staff Present Janine Ores, RN, Quentin Ore, MS, ACSM-CEP, Exercise Physiologist;Other    Virtual Visit No    Medication changes reported     No    Fall or balance concerns reported    Yes    Comments Pt uses cane    Tobacco Cessation No Change    Warm-up and Cool-down Performed as group-led instruction   Pt here for orientation   Resistance Training Performed Yes    VAD Patient? No    PAD/SET Patient? No      Pain Assessment   Currently in Pain? No/denies             Capillary Blood Glucose: No results found for this or any previous visit (from the past 24 hour(s)).   Exercise Prescription Changes - 12/05/22 1500       Response to Exercise   Blood Pressure (Admit) 118/70    Blood Pressure (Exercise) 120/58    Blood Pressure (Exit) 124/70    Heart Rate (Admit) 62 bpm    Heart Rate (Exercise) 66 bpm    Heart Rate (Exit) 60 bpm    Oxygen Saturation (Admit) 93 %    Oxygen Saturation (Exercise) 93 %    Oxygen Saturation (Exit) 92 %    Rating of Perceived Exertion (Exercise) 10    Perceived Dyspnea (Exercise) 1    Duration Progress to 30 minutes of  aerobic without signs/symptoms of physical distress    Intensity THRR unchanged      Progression   Progression Continue to progress workloads to maintain intensity without signs/symptoms of physical distress.      Resistance Training   Training Prescription Yes     Weight red bands    Reps 10-15    Time 10 Minutes      NuStep   Level 1    SPM 50    Minutes 30             Social History   Tobacco Use  Smoking Status Never  Smokeless Tobacco Never    Goals Met:  Proper associated with RPD/PD & O2 Sat Independence with exercise equipment Exercise tolerated well No report of concerns or symptoms today Strength training completed today  Goals Unmet:  Not Applicable  Comments: Service time is from 1312 to 1441.    Dr. Rodman Pickle is Medical Director for Pulmonary Rehab at [facility].

## 2022-12-07 ENCOUNTER — Encounter (HOSPITAL_COMMUNITY): Payer: Medicare Other

## 2022-12-12 ENCOUNTER — Encounter (HOSPITAL_COMMUNITY)
Admission: RE | Admit: 2022-12-12 | Discharge: 2022-12-12 | Disposition: A | Discharge status: Home / Self Care | Payer: Medicare Other | Source: Ambulatory Visit | Attending: Cardiology | Admitting: Cardiology

## 2022-12-12 DIAGNOSIS — I5032 Chronic diastolic (congestive) heart failure: Secondary | ICD-10-CM

## 2022-12-12 NOTE — Progress Notes (Signed)
Daily Session Note  Patient Details  Name: Kathy [name] MRN: [id number] Date of Birth: 1950/09/26 Referring Provider:   April Manson Pulmonary Rehab Walk Test from 11/24/2022 in [facility] for Heart, Vascular, & Lung Health  Referring Provider Sabharwal       Encounter Date: 12/12/2022  Check In:  Session Check In - 12/12/22 1442       Check-In   Supervising physician immediately available to respond to emergencies [facility] MD immediately available    Physician(s) [place] Crane, PA    Location [facility]    Staff Present Janine Ores, RN, Quentin Ore, MS, ACSM-CEP, Exercise Physiologist;Meng Winterton Corliss Parish BS, ACSM-CEP, Exercise Physiologist    Virtual Visit No    Medication changes reported     No    Fall or balance concerns reported    No    Comments Pt uses cane    Tobacco Cessation No Change    Warm-up and Cool-down Performed as group-led instruction   Pt here for orientation   Resistance Training Performed Yes    VAD Patient? No    PAD/SET Patient? No      Pain Assessment   Currently in Pain? No/denies    Multiple Pain Sites No             Capillary Blood Glucose: No results found for this or any previous visit (from the past 24 hour(s)).    Social History   Tobacco Use  Smoking Status Never  Smokeless Tobacco Never    Goals Met:  Proper associated with RPD/PD & O2 Sat Independence with exercise equipment Exercise tolerated well No report of concerns or symptoms today Strength training completed today  Goals Unmet:  Not Applicable  Comments: Service time is from 1312 to 1453.    Dr. Rodman Pickle is Medical Director for Pulmonary Rehab at [facility].

## 2022-12-14 ENCOUNTER — Encounter (HOSPITAL_COMMUNITY)
Admission: RE | Admit: 2022-12-14 | Discharge: 2022-12-14 | Disposition: A | Discharge status: Home / Self Care | Payer: Medicare Other | Source: Ambulatory Visit | Attending: Cardiology | Admitting: Cardiology

## 2022-12-14 VITALS — Wt 296.5 lb

## 2022-12-14 DIAGNOSIS — I5032 Chronic diastolic (congestive) heart failure: Secondary | ICD-10-CM

## 2022-12-14 NOTE — Progress Notes (Signed)
Daily Session Note  Patient Details  Name: Kathy [name] MRN: [id number] Date of Birth: 1950/09/22 Referring Provider:   April Manson Pulmonary Rehab Walk Test from 11/24/2022 in [facility] for Heart, Vascular, & [place]  Referring Provider Sabharwal       Encounter Date: 12/14/2022  Check In:  Session Check In - 12/14/22 1603       Check-In   Supervising physician immediately available to respond to emergencies [facility] MD immediately available    Physician(s) Mannam    [facility]    Staff Present Janine Ores, RN, Quentin Ore, MS, ACSM-CEP, Exercise Physiologist;Casey Tamala Julian, RT;Miquel Stacks Olen Cordial BS, ACSM-CEP, Exercise Physiologist;Jetta Walker BS, ACSM-CEP, Exercise Physiologist    Virtual Visit No    Medication changes reported     No    Fall or balance concerns reported    No    Tobacco Cessation No Change    Warm-up and Cool-down Performed as group-led instruction    Resistance Training Performed Yes    VAD Patient? No    PAD/SET Patient? No      Pain Assessment   Currently in Pain? No/denies             Capillary Blood Glucose: No results found for this or any previous visit (from the past 24 hour(s)).    Social History   Tobacco Use  Smoking Status Never  Smokeless Tobacco Never    Goals Met:  Independence with exercise equipment Exercise tolerated well No report of concerns or symptoms today Strength training completed today  Goals Unmet:  Not Applicable  Comments: Service time is from 1311 to 1450.    Dr. Rodman Pickle is Medical Director for Pulmonary Rehab at [facility].

## 2022-12-19 ENCOUNTER — Encounter (HOSPITAL_COMMUNITY)
Admission: RE | Admit: 2022-12-19 | Discharge: 2022-12-19 | Disposition: A | Discharge status: Home / Self Care | Payer: Medicare Other | Source: Ambulatory Visit | Attending: Cardiology | Admitting: Cardiology

## 2022-12-19 DIAGNOSIS — I5032 Chronic diastolic (congestive) heart failure: Secondary | ICD-10-CM | POA: Diagnosis not present

## 2022-12-19 NOTE — Progress Notes (Signed)
Daily Session Note  Patient Details  Name: Kathy [name] MRN: [id number] Date of Birth: [birth year] Referring Provider:   April Manson Pulmonary Rehab Walk Test from 11/24/2022 in [facility] for Heart, Vascular, & Lung Health  Referring Provider Sabharwal       Encounter Date: 12/14/2022  Check In:   Capillary Blood Glucose: No results found for this or any previous visit (from the past 24 hour(s)).   Exercise Prescription Changes - 12/19/22 1500       Response to Exercise   Blood Pressure (Admit) 110/60    Blood Pressure (Exercise) 130/70    Blood Pressure (Exit) 104/50    Heart Rate (Admit) 61 bpm    Heart Rate (Exercise) 78 bpm    Heart Rate (Exit) 66 bpm    Oxygen Saturation (Admit) 92 %    Oxygen Saturation (Exercise) 92 %    Oxygen Saturation (Exit) 98 %    Rating of Perceived Exertion (Exercise) 9    Perceived Dyspnea (Exercise) 0    Duration Continue with 30 min of aerobic exercise without signs/symptoms of physical distress.    Intensity THRR unchanged      Progression   Progression Continue to progress workloads to maintain intensity without signs/symptoms of physical distress.      Resistance Training   Training Prescription Yes    Weight red bands    Reps 10-15    Time 10 Minutes      NuStep   Level 2    SPM 50    Minutes 30    METs 1.9             Social History   Tobacco Use  Smoking Status Never  Smokeless Tobacco Never    Goals Met:  Proper associated with RPD/PD & O2 Sat Independence with exercise equipment Exercise tolerated well No report of concerns or symptoms today Strength training completed today  Goals Unmet:  Not Applicable  Comments: Service time is from 1309 to 1454.    Dr. Rodman Pickle is Medical Director for Pulmonary Rehab at [facility].

## 2022-12-21 ENCOUNTER — Encounter (HOSPITAL_COMMUNITY)
Admission: RE | Admit: 2022-12-21 | Discharge: 2022-12-21 | Disposition: A | Discharge status: Home / Self Care | Payer: Medicare Other | Source: Ambulatory Visit | Attending: Cardiology | Admitting: Cardiology

## 2022-12-21 VITALS — Wt 298.5 lb

## 2022-12-21 DIAGNOSIS — I5032 Chronic diastolic (congestive) heart failure: Secondary | ICD-10-CM | POA: Diagnosis not present

## 2022-12-21 NOTE — Progress Notes (Signed)
Daily Session Note  Patient Details  Name: Kathy [name] MRN: [id number] Date of Birth: 1950/10/12 Referring Provider:   April Manson Pulmonary Rehab Walk Test from 11/24/2022 in [facility] for Heart, Vascular, & [place]  Referring Provider Sabharwal       Encounter Date: 12/21/2022  Check In:  Session Check In - 12/21/22 [name]       Check-In   Supervising physician immediately available to respond to emergencies [facility] MD immediately available    Physician(s) Coletta Memos, PA    [facility]    Staff Present Elmon Else, MS, ACSM-CEP, Exercise Physiologist;Jetta Gilford Rile BS, ACSM-CEP, Exercise Physiologist;Randi Yevonne Pax, ACSM-CEP, Exercise Physiologist;Samantha [place], RD, Idalia Needle, MS, Exercise Physiologist;Mary Bastin, RN, BSN    Virtual Visit No    Medication changes reported     No    Fall or balance concerns reported    No    Tobacco Cessation No Change    Warm-up and Cool-down Performed as group-led instruction    Resistance Training Performed Yes    VAD Patient? No    PAD/SET Patient? No      Pain Assessment   Currently in Pain? No/denies    Pain Score 0-No pain    Multiple Pain Sites No             Capillary Blood Glucose: No results found for this or any previous visit (from the past 24 hour(s)).    Social History   Tobacco Use  Smoking Status Never  Smokeless Tobacco Never    Goals Met:  Proper associated with RPD/PD & O2 Sat Exercise tolerated well No report of concerns or symptoms today Strength training completed today  Goals Unmet:  Not Applicable  Comments: Service time is from 1308 to 1448.    Dr. Rodman Pickle is Medical Director for Pulmonary Rehab at [facility].

## 2022-12-22 ENCOUNTER — Telehealth (HOSPITAL_COMMUNITY): Payer: Self-pay | Admitting: Emergency Medicine

## 2022-12-22 NOTE — Telephone Encounter (Signed)
Reaching out to patient to offer assistance regarding upcoming cardiac imaging study; pt verbalizes understanding of appt date/time, parking situation and where to check in, pre-test NPO status and medications ordered, and verified current allergies; name and call back number provided for further questions should they arise Khameron Gruenwald RN Navigator Cardiac Imaging [facility] [phone] office [phone] cell  Denies iv issues Denies metal implants Denies claustro  

## 2022-12-25 ENCOUNTER — Other Ambulatory Visit (HOSPITAL_COMMUNITY): Payer: Self-pay | Admitting: Cardiology

## 2022-12-25 ENCOUNTER — Ambulatory Visit (HOSPITAL_COMMUNITY)
Admission: RE | Admit: 2022-12-25 | Discharge: 2022-12-25 | Disposition: A | Discharge status: Home / Self Care | Payer: Medicare Other | Source: Ambulatory Visit | Attending: Cardiology | Admitting: Cardiology

## 2022-12-25 DIAGNOSIS — I5032 Chronic diastolic (congestive) heart failure: Secondary | ICD-10-CM

## 2022-12-25 MED ORDER — GADOBUTROL 1 MMOL/ML IV SOLN
10.0000 mL | Freq: Once | INTRAVENOUS | Status: AC | PRN
  Administered 2022-12-25: 10 mL via INTRAVENOUS

## 2022-12-25 NOTE — Progress Notes (Signed)
This RN was called by MRI due to a patient fall in the restroom. This RN and Patent attorney at patient side in restroom whom was alert and oriented x4. Patient was alert still sitting on restroom floor. Patient states " that she was dizzy transitioning from wheelchair over to toilet and simple went down". Patient denies LOC with fall, also complaining of right elbow pain and shoulder pain. No notable deformity upon examination. No other complaints at this time. Aimee Saint Martin- Radiology PA was notified and arrived to examine patient. No further imaging necessary due to baseline of pain in extremity. Vitals obtained. Patient is ok with discharge home. Patient taken out via wheelchair and instructed to follow up with PCP in 2-3 days if pain worsens.

## 2022-12-26 ENCOUNTER — Telehealth (HOSPITAL_COMMUNITY): Payer: Self-pay

## 2022-12-26 ENCOUNTER — Encounter (HOSPITAL_COMMUNITY): Payer: Medicare Other

## 2022-12-26 NOTE — Telephone Encounter (Signed)
Called Kathy [name] to check on her after her fall. She did injure her arm. Kathy [name] stated that she would call her PCP and see her PCP to get clearance to come back to Pulmonary Rehab.

## 2022-12-27 NOTE — Progress Notes (Signed)
Pulmonary Individual Treatment Plan  Patient Details  Name: Kathy [name] MRN: [id number] Date of Birth: 08/08/1951 Referring Provider:   Doristine Devoid Pulmonary Rehab Walk Test from 11/24/2022 in [facility]  Referring Provider Sabharwal       Initial Encounter Date:  Flowsheet Row Pulmonary Rehab Walk Test from 11/24/2022 in [facility]  Date 11/24/22       Visit Diagnosis: Chronic diastolic CHF (congestive heart failure)  Patient's Home Medications on Admission:   Current Outpatient Medications:    ALPRAZolam (XANAX) 1 MG tablet, TAKE 1 TABLET BY MOUTH THREE TIMES A DAY AS NEEDED FOR ANXIETY, Disp: 60 tablet, Rfl: 0   amiodarone (PACERONE) 200 MG tablet, Take 1 tablet (200 mg total) by mouth 2 (two) times daily. Please cancel all previous orders for current medication. Change in dosage or pill size., Disp: 180 tablet, Rfl: 3   atorvastatin (LIPITOR) 40 MG tablet, TAKE 1 TABLET BY MOUTH EVERY DAY, Disp: 90 tablet, Rfl: 3   Calcium Carb-Cholecalciferol (CALCIUM 600 + D PO), Take 1 tablet by mouth daily., Disp: , Rfl:    cholecalciferol (VITAMIN D3) 25 MCG (1000 UNIT) tablet, Take 1,000 Units by mouth once a week. For 12 weeks, Disp: , Rfl:    clindamycin (CLEOCIN T) 1 % lotion, Apply 1 application  topically 2 (two) times daily as needed (rosacea)., Disp: , Rfl:    clobetasol (TEMOVATE) 0.05 % external solution, Apply 1 application topically daily as needed (rosacea)., Disp: , Rfl:    cyclobenzaprine (FLEXERIL) 5 MG tablet, Take 5 mg by mouth at bedtime as needed for muscle spasms., Disp: , Rfl:    ELIQUIS 5 MG TABS tablet, TAKE 1 TABLET BY MOUTH TWICE A DAY, Disp: 180 tablet, Rfl: 3   fluocinonide (LIDEX) 0.05 % external solution, Apply 1 application topically daily as needed (rosacea)., Disp: , Rfl:    FLUoxetine (PROZAC) 40 MG capsule, TAKE 1 CAPSULE BY MOUTH  EVERY DAY, Disp: 90 capsule, Rfl: 0   furosemide (LASIX) 40 MG tablet, Take 1 tablet (40 mg total) by mouth daily., Disp: 90 tablet, Rfl: 1   gabapentin (NEURONTIN) 300 MG capsule, Take 1 capsule (300 mg total) by mouth at bedtime., Disp: 90 capsule, Rfl: 1   HYDROcodone-acetaminophen (NORCO/VICODIN) 5-325 MG tablet, Take 1 tablet by mouth every 6 (six) hours as needed for moderate pain., Disp: 30 tablet, Rfl: 0   methocarbamol (ROBAXIN) 500 MG tablet, Take 500 mg by mouth every 8 (eight) hours as needed., Disp: , Rfl:    metroNIDAZOLE (METROCREAM) 0.75 % cream, Apply 1 application topically 2 (two) times daily as needed (rosacea). , Disp: , Rfl:    Multiple Vitamin (MULTIVITAMIN WITH MINERALS) TABS tablet, Take 1 tablet by mouth daily. Centrum Silver, Disp: , Rfl:    nitroGLYCERIN (NITROSTAT) 0.4 MG SL tablet, Place 1 tablet (0.4 mg total) under the tongue every 5 (five) minutes as needed for chest pain., Disp: 90 tablet, Rfl: 3   omeprazole (PRILOSEC) 40 MG capsule, TAKE 1 CAPSULE BY MOUTH EVERY DAY, Disp: 90 capsule, Rfl: 1   potassium chloride (KLOR-CON) 10 MEQ tablet, Take 1 tablet (10 mEq total) by mouth daily. Please keeps scheduled appointment for future refills. Thank you., Disp: 90 tablet, Rfl: 0   sacubitril-valsartan (ENTRESTO) 49-51 MG, Take 1 tablet by mouth 2 (two) times daily., Disp: 60 tablet, Rfl: 11  Past Medical History: Past Medical  History:  Diagnosis Date   Adenomatous colon polyp    2012   Anxiety and depression    Arthritis    Osteoarthritis, Dr Despina Hick   [facility])    skin - left foot excised   Chronic diastolic CHF (congestive heart failure) (HCC)    DDD (degenerative disc disease), cervical    Deaf, right    DVT (deep venous thrombosis) (HCC)    RLE DVT 08/2004, post right knee arthroscopy   Fibromyalgia    Dr Azzie Roup, Rheu   GERD (gastroesophageal reflux disease)    H/O hiatal hernia    Headache(784.0)    PAST HX MILD MIGRAINES - resolved per  patient 08/18/20   History of kidney stones    passed stones   HTN (hypertension)    IBS (irritable bowel syndrome)    Internal hemorrhoid    Lumbar radiculopathy    Obesity    OSA on CPAP    Moderate with AHI 20/hr with nocturnal hypoxemia on CPAP   PAF (paroxysmal atrial fibrillation) (HCC) 06/2018   Pulmonary hypertension (HCC)     Tobacco Use: Social History   Tobacco Use  Smoking Status Never  Smokeless Tobacco Never    Labs: Review Flowsheet  More data exists      Latest Ref Rng & Units 06/26/2018 10/12/2021 07/21/2022 10/10/2022 11/15/2022  Labs for ITP Cardiac and Pulmonary Rehab  Cholestrol 0 - 200 mg/dL - 213  - - 086   LDL (calc) 0 - 99 mg/dL - 71  - - 90   HDL-C >57.84 mg/dL - 69.62  - - 95.28   Trlycerides 0.0 - 149.0 mg/dL - 41.3  - - 24.4   Hemoglobin A1c 4.6 - 6.5 % 5.4  5.8  - - 5.7   Bicarbonate 20.0 - 28.0 mmol/L 20.0 - 28.0 mmol/L - - - 28.9  28.5  -  TCO2 22 - 32 mmol/L 22 - 32 mmol/L - - 29  30  30   -  O2 Saturation % % - - - 57  55  -    Capillary Blood Glucose: Lab Results  Component Value Date   GLUCAP 74 07/27/2022   GLUCAP 93 07/27/2022   GLUCAP 113 (H) 06/25/2018     Pulmonary Assessment Scores:  Pulmonary Assessment Scores     Row Name 11/24/22 1410         ADL UCSD   SOB Score total 40       CAT Score   CAT Score 12       mMRC Score   mMRC Score 3             UCSD: Self-administered rating of dyspnea associated with activities of daily living (ADLs) 6-point scale (0 = "not at all" to 5 = "maximal or unable to do because of breathlessness")  Scoring Scores range from 0 to 120.  Minimally important difference is 5 units  CAT: CAT can identify the health impairment of COPD patients and is better correlated with disease progression.  CAT has a scoring range of zero to 40. The CAT score is classified into four groups of low (less than 10), medium (10 - 20), high (21-30) and very high (31-40) based on the impact level of  disease on health status. A CAT score over 10 suggests significant symptoms.  A worsening CAT score could be explained by an exacerbation, poor medication adherence, poor inhaler technique, or progression of COPD or comorbid conditions.  CAT MCID is 2  points  mMRC: mMRC (Modified Medical Research Council) Dyspnea Scale is used to assess the degree of baseline functional disability in patients of respiratory disease due to dyspnea. No minimal important difference is established. A decrease in score of 1 point or greater is considered a positive change.   Pulmonary Function Assessment:  Pulmonary Function Assessment - 11/24/22 1410       Breath   Bilateral Breath Sounds Clear    Shortness of Breath No;Limiting activity             Exercise Target Goals: Exercise Program Goal: Individual exercise prescription set using results from initial 6 min walk test and THRR while considering  patient's activity barriers and safety.   Exercise Prescription Goal: Initial exercise prescription builds to 30-45 minutes a day of aerobic activity, 2-3 days per week.  Home exercise guidelines will be given to patient during program as part of exercise prescription that the participant will acknowledge.  Activity Barriers & Risk Stratification:  Activity Barriers & Cardiac Risk Stratification - 11/24/22 1408       Activity Barriers & Cardiac Risk Stratification   Activity Barriers Deconditioning;Muscular Weakness;Shortness of Breath;Back Problems;Fibromyalgia;History of Falls             6 Minute Walk:  6 Minute Walk     Row Name 11/24/22 1503         6 Minute Walk   Phase Initial     Distance 600 feet     Walk Time 6 minutes     # of Rest Breaks 1     MPH 1.14     METS 0.84     RPE 12     Perceived Dyspnea  2     VO2 Peak 2.94     Resting HR 64 bpm     Resting BP 136/72     Resting Oxygen Saturation  95 %     Exercise Oxygen Saturation  during 6 min walk 90 %     Max Ex. HR  91 bpm     Max Ex. BP 193/77     2 Minute Post BP 149/79       Interval HR   1 Minute HR 78     2 Minute HR 84     3 Minute HR 88     4 Minute HR 88     5 Minute HR 87     6 Minute HR 91     2 Minute Post HR 69     Interval Heart Rate? Yes              Oxygen Initial Assessment:  Oxygen Initial Assessment - 11/24/22 1409       Home Oxygen   Home Oxygen Device None    Home Exercise Oxygen Prescription None    Home Resting Oxygen Prescription None             Oxygen Re-Evaluation:  Oxygen Re-Evaluation     Row Name 11/24/22 1409 11/28/22 0919 12/18/22 1526         Program Oxygen Prescription   Program Oxygen Prescription None None None       Home Oxygen   Home Oxygen Device -- None None     Sleep Oxygen Prescription None None None     Home Exercise Oxygen Prescription -- None None     Home Resting Oxygen Prescription -- None None     Compliance with Home Oxygen Use -- -- --  Goals/Expected Outcomes   Short Term Goals To learn and understand importance of maintaining oxygen saturations>88%;To learn and demonstrate proper pursed lip breathing techniques or other breathing techniques.  To learn and understand importance of maintaining oxygen saturations>88%;To learn and demonstrate proper pursed lip breathing techniques or other breathing techniques.  To learn and understand importance of maintaining oxygen saturations>88%;To learn and demonstrate proper pursed lip breathing techniques or other breathing techniques.      Long  Term Goals Maintenance of O2 saturations>88%;Exhibits proper breathing techniques, such as pursed lip breathing or other method taught during program session Maintenance of O2 saturations>88%;Exhibits proper breathing techniques, such as pursed lip breathing or other method taught during program session Maintenance of O2 saturations>88%;Exhibits proper breathing techniques, such as pursed lip breathing or other method taught during program  session     Goals/Expected Outcomes -- Compliance and understanding of oxygen saturation monitoring and breathing techniques to decrease shortness of breath Compliance and understanding of oxygen saturation monitoring and breathing techniques to decrease shortness of breath              Oxygen Discharge (Final Oxygen Re-Evaluation):  Oxygen Re-Evaluation - 12/18/22 1526       Program Oxygen Prescription   Program Oxygen Prescription None      Home Oxygen   Home Oxygen Device None    Sleep Oxygen Prescription None    Home Exercise Oxygen Prescription None    Home Resting Oxygen Prescription None      Goals/Expected Outcomes   Short Term Goals To learn and understand importance of maintaining oxygen saturations>88%;To learn and demonstrate proper pursed lip breathing techniques or other breathing techniques.     Long  Term Goals Maintenance of O2 saturations>88%;Exhibits proper breathing techniques, such as pursed lip breathing or other method taught during program session    Goals/Expected Outcomes Compliance and understanding of oxygen saturation monitoring and breathing techniques to decrease shortness of breath             Initial Exercise Prescription:  Initial Exercise Prescription - 11/24/22 1500       Date of Initial Exercise RX and Referring Provider   Date 11/24/22    Referring Provider Sabharwal    Expected Discharge Date 02/20/23      NuStep   Level 1    SPM 50    Minutes 30      Prescription Details   Frequency (times per week) 2    Duration Progress to 30 minutes of continuous aerobic without signs/symptoms of physical distress      Intensity   THRR 40-80% of Max Heartrate 60-120    Ratings of Perceived Exertion 11-13    Perceived Dyspnea 0-4      Progression   Progression Continue progressive overload as per policy without signs/symptoms or physical distress.      Resistance Training   Training Prescription Yes    Weight red bands    Reps  10-15             Perform Capillary Blood Glucose checks as needed.  Exercise Prescription Changes:   Exercise Prescription Changes     Row Name 12/05/22 1500 12/19/22 1500           Response to Exercise   Blood Pressure (Admit) 118/70 110/60      Blood Pressure (Exercise) 120/58 130/70      Blood Pressure (Exit) 124/70 104/50      Heart Rate (Admit) 62 bpm 61 bpm  Heart Rate (Exercise) 66 bpm 78 bpm      Heart Rate (Exit) 60 bpm 66 bpm      Oxygen Saturation (Admit) 93 % 92 %      Oxygen Saturation (Exercise) 93 % 92 %      Oxygen Saturation (Exit) 92 % 98 %      Rating of Perceived Exertion (Exercise) 10 9      Perceived Dyspnea (Exercise) 1 0      Duration Progress to 30 minutes of  aerobic without signs/symptoms of physical distress Continue with 30 min of aerobic exercise without signs/symptoms of physical distress.      Intensity THRR unchanged THRR unchanged        Progression   Progression Continue to progress workloads to maintain intensity without signs/symptoms of physical distress. Continue to progress workloads to maintain intensity without signs/symptoms of physical distress.        Resistance Training   Training Prescription Yes Yes      Weight red bands red bands      Reps 10-15 10-15      Time 10 Minutes 10 Minutes        NuStep   Level 1 2      SPM 50 50      Minutes 30 30      METs -- 1.9               Exercise Comments:   Exercise Goals and Review:   Exercise Goals     Row Name 11/24/22 1409 11/28/22 0918 12/18/22 1524         Exercise Goals   Increase Physical Activity Yes Yes Yes     Intervention Provide advice, education, support and counseling about physical activity/exercise needs.;Develop an individualized exercise prescription for aerobic and resistive training based on initial evaluation findings, risk stratification, comorbidities and participant's personal goals. Provide advice, education, support and counseling  about physical activity/exercise needs.;Develop an individualized exercise prescription for aerobic and resistive training based on initial evaluation findings, risk stratification, comorbidities and participant's personal goals. Provide advice, education, support and counseling about physical activity/exercise needs.;Develop an individualized exercise prescription for aerobic and resistive training based on initial evaluation findings, risk stratification, comorbidities and participant's personal goals.     Expected Outcomes Short Term: Attend rehab on a regular basis to increase amount of physical activity.;Long Term: Exercising regularly at least 3-5 days a week.;Long Term: Add in home exercise to make exercise part of routine and to increase amount of physical activity. Short Term: Attend rehab on a regular basis to increase amount of physical activity.;Long Term: Exercising regularly at least 3-5 days a week.;Long Term: Add in home exercise to make exercise part of routine and to increase amount of physical activity. Short Term: Attend rehab on a regular basis to increase amount of physical activity.;Long Term: Exercising regularly at least 3-5 days a week.;Long Term: Add in home exercise to make exercise part of routine and to increase amount of physical activity.     Increase Strength and Stamina Yes Yes Yes     Intervention Provide advice, education, support and counseling about physical activity/exercise needs.;Develop an individualized exercise prescription for aerobic and resistive training based on initial evaluation findings, risk stratification, comorbidities and participant's personal goals. Provide advice, education, support and counseling about physical activity/exercise needs.;Develop an individualized exercise prescription for aerobic and resistive training based on initial evaluation findings, risk stratification, comorbidities and participant's personal goals. Provide advice, education,  support and  counseling about physical activity/exercise needs.;Develop an individualized exercise prescription for aerobic and resistive training based on initial evaluation findings, risk stratification, comorbidities and participant's personal goals.     Expected Outcomes Short Term: Increase workloads from initial exercise prescription for resistance, speed, and METs.;Short Term: Perform resistance training exercises routinely during rehab and add in resistance training at home;Long Term: Improve cardiorespiratory fitness, muscular endurance and strength as measured by increased METs and functional capacity ( ) Short Term: Increase workloads from initial exercise prescription for resistance, speed, and METs.;Short Term: Perform resistance training exercises routinely during rehab and add in resistance training at home;Long Term: Improve cardiorespiratory fitness, muscular endurance and strength as measured by increased METs and functional capacity ( ) Short Term: Increase workloads from initial exercise prescription for resistance, speed, and METs.;Short Term: Perform resistance training exercises routinely during rehab and add in resistance training at home;Long Term: Improve cardiorespiratory fitness, muscular endurance and strength as measured by increased METs and functional capacity ( )     Able to understand and use rate of perceived exertion (RPE) scale Yes Yes Yes     Intervention Provide education and explanation on how to use RPE scale Provide education and explanation on how to use RPE scale Provide education and explanation on how to use RPE scale     Expected Outcomes Short Term: Able to use RPE daily in rehab to express subjective intensity level;Long Term:  Able to use RPE to guide intensity level when exercising independently Short Term: Able to use RPE daily in rehab to express subjective intensity level;Long Term:  Able to use RPE to guide intensity level when exercising  independently Short Term: Able to use RPE daily in rehab to express subjective intensity level;Long Term:  Able to use RPE to guide intensity level when exercising independently     Able to understand and use Dyspnea scale Yes Yes Yes     Intervention Provide education and explanation on how to use Dyspnea scale Provide education and explanation on how to use Dyspnea scale Provide education and explanation on how to use Dyspnea scale     Expected Outcomes Short Term: Able to use Dyspnea scale daily in rehab to express subjective sense of shortness of breath during exertion;Long Term: Able to use Dyspnea scale to guide intensity level when exercising independently Short Term: Able to use Dyspnea scale daily in rehab to express subjective sense of shortness of breath during exertion;Long Term: Able to use Dyspnea scale to guide intensity level when exercising independently Short Term: Able to use Dyspnea scale daily in rehab to express subjective sense of shortness of breath during exertion;Long Term: Able to use Dyspnea scale to guide intensity level when exercising independently     Knowledge and understanding of Target Heart Rate Range (THRR) Yes Yes Yes     Intervention Provide education and explanation of THRR including how the numbers were predicted and where they are located for reference Provide education and explanation of THRR including how the numbers were predicted and where they are located for reference Provide education and explanation of THRR including how the numbers were predicted and where they are located for reference     Expected Outcomes Short Term: Able to state/look up THRR;Short Term: Able to use daily as guideline for intensity in rehab;Long Term: Able to use THRR to govern intensity when exercising independently Short Term: Able to state/look up THRR;Short Term: Able to use daily as guideline for intensity in rehab;Long Term: Able to use THRR to govern  intensity when exercising  independently Short Term: Able to state/look up THRR;Short Term: Able to use daily as guideline for intensity in rehab;Long Term: Able to use THRR to govern intensity when exercising independently     Understanding of Exercise Prescription Yes Yes Yes     Intervention Provide education, explanation, and written materials on patient's individual exercise prescription Provide education, explanation, and written materials on patient's individual exercise prescription Provide education, explanation, and written materials on patient's individual exercise prescription     Expected Outcomes Short Term: Able to explain program exercise prescription;Long Term: Able to explain home exercise prescription to exercise independently Short Term: Able to explain program exercise prescription;Long Term: Able to explain home exercise prescription to exercise independently Short Term: Able to explain program exercise prescription;Long Term: Able to explain home exercise prescription to exercise independently              Exercise Goals Re-Evaluation :  Exercise Goals Re-Evaluation     Row Name 11/28/22 0918 12/18/22 1524           Exercise Goal Re-Evaluation   Exercise Goals Review Increase Physical Activity;Able to understand and use Dyspnea scale;Understanding of Exercise Prescription;Increase Strength and Stamina;Knowledge and understanding of Target Heart Rate Range (THRR);Able to understand and use rate of perceived exertion (RPE) scale Increase Physical Activity;Able to understand and use Dyspnea scale;Understanding of Exercise Prescription;Increase Strength and Stamina;Knowledge and understanding of Target Heart Rate Range (THRR);Able to understand and use rate of perceived exertion (RPE) scale      Comments Pt will begin exercise on 3/14. Will monitor for progression. Pt has completed 4 exercise sessions with good attendance. She is exercising on the nustep recumbent stepper for 30 min, METs 1.8. She  recently increased to level 2. Plan to move pt to recumbent bike for the second station this week. Will monitor for progression.      Expected Outcomes Through exercise at rehab and home the patient will decrease shortness of breath with daily activities and feel confident in carrying out an exercise regimn at home Through exercise at rehab and home the patient will decrease shortness of breath with daily activities and feel confident in carrying out an exercise regimn at home               Discharge Exercise Prescription (Final Exercise Prescription Changes):  Exercise Prescription Changes - 12/19/22 1500       Response to Exercise   Blood Pressure (Admit) 110/60    Blood Pressure (Exercise) 130/70    Blood Pressure (Exit) 104/50    Heart Rate (Admit) 61 bpm    Heart Rate (Exercise) 78 bpm    Heart Rate (Exit) 66 bpm    Oxygen Saturation (Admit) 92 %    Oxygen Saturation (Exercise) 92 %    Oxygen Saturation (Exit) 98 %    Rating of Perceived Exertion (Exercise) 9    Perceived Dyspnea (Exercise) 0    Duration Continue with 30 min of aerobic exercise without signs/symptoms of physical distress.    Intensity THRR unchanged      Progression   Progression Continue to progress workloads to maintain intensity without signs/symptoms of physical distress.      Resistance Training   Training Prescription Yes    Weight red bands    Reps 10-15    Time 10 Minutes      NuStep   Level 2    SPM 50    Minutes 30    METs 1.9  Nutrition:  Target Goals: Understanding of nutrition guidelines, daily intake of sodium 1500mg , cholesterol 200mg , calories 30% from fat and 7% or less from saturated fats, daily to have 5 or more servings of fruits and vegetables.  Biometrics:    Nutrition Therapy Plan and Nutrition Goals:  Nutrition Therapy & Goals - 12/26/22 1446       Nutrition Therapy   Diet Heart Healthy Diet    Drug/Food Interactions Statins/Certain Fruits       Personal Nutrition Goals   Nutrition Goal Patient to improve diet quality by using the plate method as a guide for meal planning to include lean protein/plant protein, fruits, vegetables, whole grains, nonfat dairy as part of a well-balanced diet.    Personal Goal #2 Patient to limit sodium to 1500mg  per day    Comments Goals in action. Joyce GrossKay reports reading food labels for sodium, eating fish weekly, and choosing whole grains regularly. She reports lengthy orthopedic history, osteoarthritis, and fibromyalgia that impacts her pain and physical activity/mobility. Joyce GrossKay will benefit from participation in pulmonary rehab for nutrition, exercise, and lifestyle modification.      Intervention Plan   Intervention Prescribe, educate and counsel regarding individualized specific dietary modifications aiming towards targeted core components such as weight, hypertension, lipid management, diabetes, heart failure and other comorbidities.;Nutrition handout(s) given to patient.    Expected Outcomes Short Term Goal: Understand basic principles of dietary content, such as calories, fat, sodium, cholesterol and nutrients.;Long Term Goal: Adherence to prescribed nutrition plan.             Nutrition Assessments:  Nutrition Assessments - 12/01/22 1131       Rate Your Plate Scores   Pre Score 71            MEDIFICTS Score Key: ?70 Need to make dietary changes  40-70 Heart Healthy Diet ? 40 Therapeutic Level Cholesterol Diet  Flowsheet Row PULMONARY REHAB OTHER RESPIRATORY [facility]  Picture Your Plate Total Score on Admission 71      Picture Your Plate Scores: <16<40 Unhealthy dietary pattern with much room for improvement. 41-50 Dietary pattern unlikely to meet recommendations for good health and room for improvement. 51-60 More healthful dietary pattern, with some room for improvement.  >60 Healthy dietary pattern,  although there may be some specific behaviors that could be improved.    Nutrition Goals Re-Evaluation:  Nutrition Goals Re-Evaluation     Row Name 11/30/22 1556 12/26/22 1446           Goals   Current Weight 292 lb 8.8 oz (132.7 kg) --      Comment GFR 43, Cr 1.32, A1c 5.8, lipids WNL, B12 low/normal no new labs at this time; most recent labs GFR 43, Cr 1.32, A1c 5.8, lipids WNL, B12 low/normal      Expected Outcome Goals in action. Joyce GrossKay reports reading food labels for sodium, eating fish weekly, and choosing whole grains regularly. She reports lengthy orthopedic history, osteoarthritis, and fibromyalgia that impacts her pain and physical activity/mobility. Joyce GrossKay will benefit from participation in pulmonary rehab for nutrition, exercise, and lifestyle modification. Goals in action. Joyce GrossKay reports reading food labels for sodium, eating fish weekly, and choosing whole grains regularly. She reports lengthy orthopedic history, osteoarthritis, and fibromyalgia that impacts her pain and physical activity/mobility. Joyce GrossKay will benefit from participation in pulmonary rehab for nutrition, exercise, and lifestyle modification.  Nutrition Goals Discharge (Final Nutrition Goals Re-Evaluation):  Nutrition Goals Re-Evaluation - 12/26/22 1446       Goals   Comment no new labs at this time; most recent labs GFR 43, Cr 1.32, A1c 5.8, lipids WNL, B12 low/normal    Expected Outcome Goals in action. Joyce Gross reports reading food labels for sodium, eating fish weekly, and choosing whole grains regularly. She reports lengthy orthopedic history, osteoarthritis, and fibromyalgia that impacts her pain and physical activity/mobility. Joyce Gross will benefit from participation in pulmonary rehab for nutrition, exercise, and lifestyle modification.             Psychosocial: Target Goals: Acknowledge presence or absence of significant depression and/or stress, maximize coping skills, provide positive support  system. Participant is able to verbalize types and ability to use techniques and skills needed for reducing stress and depression.  Initial Review & Psychosocial Screening:  Initial Psych Review & Screening - 11/24/22 1358       Initial Review   Current issues with Current Anxiety/Panic;Current Psychotropic Meds      Family Dynamics   Good Support System? Yes    Comments Sons      Screening Interventions   Interventions Encouraged to exercise             Quality of Life Scores:  Scores of 19 and below usually indicate a poorer quality of life in these areas.  A difference of  2-3 points is a clinically meaningful difference.  A difference of 2-3 points in the total score of the Quality of Life Index has been associated with significant improvement in overall quality of life, self-image, physical symptoms, and general health in studies assessing change in quality of life.  PHQ-9: Review Flowsheet  More data exists      11/24/2022 06/06/2022 05/09/2022 10/12/2021 07/12/2021  Depression screen PHQ 2/9  Decreased Interest 0 0 1 1 2   Down, Depressed, Hopeless 0 0 0 1 2  PHQ - 2 Score 0 0 1 2 4   Altered sleeping 0 0 3 2 2   Tired, decreased energy 0 0 1 1 2   Change in appetite 0 0 3 3 2   Feeling bad or failure about yourself  0 0 0 0 0  Trouble concentrating 0 0 0 0 1  Moving slowly or fidgety/restless 0 0 0 0 0  Suicidal thoughts 0 0 0 0 0  PHQ-9 Score 0 0 8 8 11   Difficult doing work/chores - Not difficult at all Not difficult at all Not difficult at all Not difficult at all   Interpretation of Total Score  Total Score Depression Severity:  1-4 = Minimal depression, 5-9 = Mild depression, 10-14 = Moderate depression, 15-19 = Moderately severe depression, 20-27 = Severe depression   Psychosocial Evaluation and Intervention:  Psychosocial Evaluation - 11/24/22 1359       Psychosocial Evaluation & Interventions   Interventions Encouraged to exercise with the program and  follow exercise prescription    Comments Joyce Gross currently denies any psychosocial barriers or concerns.    Expected Outcomes For Scott to continue PR without any psychosocial barriers or concerns.    Continue Psychosocial Services  No Follow up required             Psychosocial Re-Evaluation:  Psychosocial Re-Evaluation     Row Name 11/24/22 1510 12/15/22 0840           Psychosocial Re-Evaluation   Current issues with Current Anxiety/Panic;Current Psychotropic Meds --  Comments No change in psychosocial assessment since orientation 11/24/2022. No concerns identified Dennie Bible denies any psychosocial barriers or concerns at this time.      Expected Outcomes To attend PR without any psychosocial barriers or concerns To attend PR without any psychosocial barriers or concerns      Interventions Relaxation education;Encouraged to attend Pulmonary Rehabilitation for the exercise Encouraged to attend Pulmonary Rehabilitation for the exercise      Continue Psychosocial Services  No Follow up required No Follow up required               Psychosocial Discharge (Final Psychosocial Re-Evaluation):  Psychosocial Re-Evaluation - 12/15/22 0840       Psychosocial Re-Evaluation   Comments Dennie Bible denies any psychosocial barriers or concerns at this time.    Expected Outcomes To attend PR without any psychosocial barriers or concerns    Interventions Encouraged to attend Pulmonary Rehabilitation for the exercise    Continue Psychosocial Services  No Follow up required             Education: Education Goals: Education classes will be provided on a weekly basis, covering required topics. Participant will state understanding/return demonstration of topics presented.  Learning Barriers/Preferences:   Education Topics: Introduction to Pulmonary Rehab Group instruction provided by PowerPoint, verbal discussion, and written material to support subject matter. Instructor reviews what Pulmonary  Rehab is, the purpose of the program, and how patients are referred.     Know Your Numbers Group instruction that is supported by a PowerPoint presentation. Instructor discusses importance of knowing and understanding resting, exercise, and post-exercise oxygen saturation, heart rate, and blood pressure. Oxygen saturation, heart rate, blood pressure, rating of perceived exertion, and dyspnea are reviewed along with a normal range for these values.    Exercise for the Pulmonary Patient Group instruction that is supported by a PowerPoint presentation. Instructor discusses benefits of exercise, core components of exercise, frequency, duration, and intensity of an exercise routine, importance of utilizing pulse oximetry during exercise, safety while exercising, and options of places to exercise outside of rehab.  Flowsheet Row PULMONARY REHAB OTHER RESPIRATORY from 12/21/2022 in [facility], [facility], [facility]  Date 12/21/22  Educator EP  Instruction Review Code 1- Verbalizes Understanding          MET Level  Group instruction provided by PowerPoint, verbal discussion, and written material to support subject matter. Instructor reviews what METs are and how to increase METs.    Pulmonary Medications Verbally interactive group education provided by instructor with focus on inhaled medications and proper administration. Flowsheet Row PULMONARY REHAB OTHER RESPIRATORY from 12/14/2022 in [facility] for Heart, Vascular, & Lung Health  Date 12/14/22  Educator RT  Instruction Review Code 1- Verbalizes Understanding       Anatomy and Physiology of the Respiratory System Group instruction provided by PowerPoint, verbal discussion, and written material to support subject matter. Instructor reviews respiratory cycle and anatomical components of the respiratory system and their functions. Instructor also reviews differences in obstructive  and restrictive respiratory diseases with examples of each.    Oxygen Safety Group instruction provided by PowerPoint, verbal discussion, and written material to support subject matter. There is an overview of "What is Oxygen" and "Why do we need it".  Instructor also reviews how to create a safe environment for oxygen use, the importance of using oxygen as prescribed, and the risks of noncompliance. There is a brief discussion on traveling  with oxygen and resources the patient may utilize.   Oxygen Use Group instruction provided by PowerPoint, verbal discussion, and written material to discuss how supplemental oxygen is prescribed and different types of oxygen supply systems. Resources for more information are provided.    Breathing Techniques Group instruction that is supported by demonstration and informational handouts. Instructor discusses the benefits of pursed lip and diaphragmatic breathing and detailed demonstration on how to perform both.     Risk Factor Reduction Group instruction that is supported by a PowerPoint presentation. Instructor discusses the definition of a risk factor, different risk factors for pulmonary disease, and how the heart and lungs work together.   MD Day A group question and answer session with a medical doctor that allows participants to ask questions that relate to their pulmonary disease state.   Nutrition for the Pulmonary Patient Group instruction provided by PowerPoint slides, verbal discussion, and written materials to support subject matter. The instructor gives an explanation and review of healthy diet recommendations, which includes a discussion on weight management, recommendations for fruit and vegetable consumption, as well as protein, fluid, caffeine, fiber, sodium, sugar, and alcohol. Tips for eating when patients are short of breath are discussed.    Other Education Group or individual verbal, written, or video instructions that support  the educational goals of the pulmonary rehab program.    Knowledge Questionnaire Score:  Knowledge Questionnaire Score - 11/24/22 1416       Knowledge Questionnaire Score   Pre Score 14/18             Core Components/Risk Factors/Patient Goals at Admission:  Personal Goals and Risk Factors at Admission - 11/24/22 1359       Core Components/Risk Factors/Patient Goals on Admission    Weight Management Weight Loss    Improve shortness of breath with ADL's Yes    Intervention Provide education, individualized exercise plan and daily activity instruction to help decrease symptoms of SOB with activities of daily living.    Expected Outcomes Short Term: Improve cardiorespiratory fitness to achieve a reduction of symptoms when performing ADLs;Long Term: Be able to perform more ADLs without symptoms or delay the onset of symptoms             Core Components/Risk Factors/Patient Goals Review:   Goals and Risk Factor Review     Row Name 11/24/22 1512 12/15/22 0843           Core Components/Risk Factors/Patient Goals Review   Personal Goals Review Weight Management/Obesity;Heart Failure;Develop more efficient breathing techniques such as purse lipped breathing and diaphragmatic breathing and practicing self-pacing with activity. Weight Management/Obesity;Develop more efficient breathing techniques such as purse lipped breathing and diaphragmatic breathing and practicing self-pacing with activity.;Increase knowledge of respiratory medications and ability to use respiratory devices properly.;Improve shortness of breath with ADL's;Heart Failure      Review Joyce Gross has not yet started the PR program. She is scheduled to start next Thursday 11/30/22 Joyce Gross has attended 4 sessions so far. She is currently exercising on the Nustep for 30 minutes. She has been able to increase her level on the Nustep. She is demonstrating purse lip breathing when getting short of breath. Joyce Gross is able to report her rate  of perceived exertion and dyspnea scores when asked by staff. Joyce Gross is also working with the staff dietician to work on her weight loss goals and ways to cut down on sodium to better control her heart failure. Joyce Gross has attended class on pulmonary medications. Joyce Gross  enjoys coming to class and feels like it is helping her build her endurance.      Expected Outcomes See admission goals See admission goals               Core Components/Risk Factors/Patient Goals at Discharge (Final Review):   Goals and Risk Factor Review - 12/15/22 0843       Core Components/Risk Factors/Patient Goals Review   Personal Goals Review Weight Management/Obesity;Develop more efficient breathing techniques such as purse lipped breathing and diaphragmatic breathing and practicing self-pacing with activity.;Increase knowledge of respiratory medications and ability to use respiratory devices properly.;Improve shortness of breath with ADL's;Heart Failure    Review Joyce Gross has attended 4 sessions so far. She is currently exercising on the Nustep for 30 minutes. She has been able to increase her level on the Nustep. She is demonstrating purse lip breathing when getting short of breath. Joyce Gross is able to report her rate of perceived exertion and dyspnea scores when asked by staff. Joyce Gross is also working with the staff dietician to work on her weight loss goals and ways to cut down on sodium to better control her heart failure. Joyce Gross has attended class on pulmonary medications. Joyce Gross enjoys coming to class and feels like it is helping her build her endurance.    Expected Outcomes See admission goals             ITP Comments:Pt is making expected progress toward Pulmonary Rehab goals after completing 6 sessions. Recommend continued exercise, life style modification, education, and utilization of breathing techniques to increase stamina and strength, while also decreasing shortness of breath with exertion.  Dr. Mechele Collin is Medical Director  for Pulmonary Rehab at [facility].

## 2022-12-28 ENCOUNTER — Ambulatory Visit (INDEPENDENT_AMBULATORY_CARE_PROVIDER_SITE_OTHER): Payer: Medicare Other

## 2022-12-28 ENCOUNTER — Encounter: Payer: Self-pay | Admitting: Internal Medicine

## 2022-12-28 ENCOUNTER — Ambulatory Visit: Payer: Medicare Other | Admitting: Internal Medicine

## 2022-12-28 ENCOUNTER — Encounter (HOSPITAL_COMMUNITY): Admission: RE | Admit: 2022-12-28 | Payer: Medicare Other | Source: Ambulatory Visit

## 2022-12-28 ENCOUNTER — Other Ambulatory Visit: Payer: Self-pay | Admitting: Internal Medicine

## 2022-12-28 VITALS — BP 153/75 | HR 63 | Temp 97.5°F | Wt 299.6 lb

## 2022-12-28 DIAGNOSIS — M79601 Pain in right arm: Secondary | ICD-10-CM

## 2022-12-28 DIAGNOSIS — F419 Anxiety disorder, unspecified: Secondary | ICD-10-CM

## 2022-12-28 DIAGNOSIS — W19XXXD Unspecified fall, subsequent encounter: Secondary | ICD-10-CM

## 2022-12-28 NOTE — Progress Notes (Signed)
Established Patient Office Visit     CC/Reason for Visit: Right arm pain after fall  HPI: Kathy [name] is a 72 y.o. female who is coming in today for the above mentioned reasons.  4 days ago she was having a cardiac MRI.  Afterwards she needed to use the restroom.  She lost her balance and tipped forward hitting her right shoulder upper arm and elbow with immediate pain.  She did not lose consciousness.  She was told she needed to be cleared by me before she could go back to her cardiac rehab.  She has issues with range of motion.  Has had to rely on help from family.   Past Medical/Surgical History: Past Medical History:  Diagnosis Date   Adenomatous colon polyp    2012   Anxiety and depression    Arthritis    Osteoarthritis, Dr Despina Hick   Cancer    skin - left foot excised   Chronic diastolic CHF (congestive heart failure)    DDD (degenerative disc disease), cervical    Deaf, right    DVT (deep venous thrombosis)    RLE DVT 08/2004, post right knee arthroscopy   Fibromyalgia    Dr Azzie Roup, Rheu   GERD (gastroesophageal reflux disease)    H/O hiatal hernia    Headache(784.0)    PAST HX MILD MIGRAINES - resolved per patient 08/18/20   History of kidney stones    passed stones   HTN (hypertension)    IBS (irritable bowel syndrome)    Internal hemorrhoid    Lumbar radiculopathy    Obesity    OSA on CPAP    Moderate with AHI 20/hr with nocturnal hypoxemia on CPAP   PAF (paroxysmal atrial fibrillation) 06/2018   Pulmonary hypertension     Past Surgical History:  Procedure Laterality Date   ABDOMINAL HYSTERECTOMY     dysfunctional menses   APPENDECTOMY     BREAST EXCISIONAL BIOPSY Right    CARDIAC CATHETERIZATION  06/26/2018   CARDIOVERSION N/A 10/10/2022   Procedure: CARDIOVERSION;  Surgeon: Dorthula Nettles, DO;  Location: [facility] ENDOSCOPY;  Service: Cardiovascular;  Laterality: N/A;   CHOLECYSTECTOMY     COLONOSCOPY  05/27/2002, 08/08/11   2003  diverticulosis, hemorrhoids 2012 same + cecal polyp   EYE SURGERY     KNEE ARTHROSCOPY  2008   right   KYPHOPLASTY Bilateral 08/19/2020   Procedure: KYPHOPLASTY LUMBAR ONE;  Surgeon: Bedelia Person, MD;  Location: [facility] OR;  Service: Neurosurgery;  Laterality: Bilateral;  posterior   LEFT HEART CATH AND CORONARY ANGIOGRAPHY N/A 06/26/2018   Procedure: LEFT HEART CATH AND CORONARY ANGIOGRAPHY;  Surgeon: Iran Ouch, MD;  Location: [facility] INVASIVE CV LAB;  Service: Cardiovascular;  Laterality: N/A;   MELANOMA EXCISION Left 07/2013   foot   RIGHT HEART CATH N/A 10/10/2022   Procedure: RIGHT HEART CATH;  Surgeon: Dorthula Nettles, DO;  Location: [facility] INVASIVE CV LAB;  Service: Cardiovascular;  Laterality: N/A;   TEE WITHOUT CARDIOVERSION N/A 10/10/2022   Procedure: TRANSESOPHAGEAL ECHOCARDIOGRAM (TEE);  Surgeon: Dorthula Nettles, DO;  Location: [facility] ENDOSCOPY;  Service: Cardiovascular;  Laterality: N/A;   TOTAL HIP ARTHROPLASTY  02/2010   left ; Dr Despina Hick   TOTAL HIP ARTHROPLASTY  03/18/2012   Procedure: TOTAL HIP ARTHROPLASTY;  Surgeon: Loanne Drilling, MD;  Location: [facility] ORS;  Service: Orthopedics;  Laterality: Right;   UPPER GASTROINTESTINAL ENDOSCOPY  04/21/2009, 08/08/11   2010 gastritis ;2012 small hiatal hernia. Dr Leone Payor  WRIST FRACTURE SURGERY Left     Social History:  reports that she has never smoked. She has never used smokeless tobacco. She reports current alcohol use. She reports that she does not use drugs.  Allergies: Allergies  Allergen Reactions   Achromycin [Tetracycline Hcl] Shortness Of Breath    Dyspnea     Penicillins     Facial angioedema Did it involve swelling of the face/tongue/throat, SOB, or low BP? Yes Did it involve sudden or severe rash/hives, skin peeling, or any reaction on the inside of your mouth or nose? No Did you need to seek medical attention at a hospital or doctor's office? No When did it last happen?      30+ years If all above answers are  "NO", may proceed with cephalosporin use.    Sulfamethoxazole-Trimethoprim Hives   Nickel Itching   Zanaflex [Tizanidine Hydrochloride]     Causes more pain     Family History:  Family History  Problem Relation Age of Onset   Heart attack Mother [id number]   COPD Mother    Breast cancer Mother [phone]   Hypertension Father    Diabetes Father    Heart failure Father [phone]   Breast cancer Paternal Aunt        cns mets   COPD Maternal Grandmother    Heart disease Paternal Grandmother        died [phone]   Diabetes Paternal Grandmother    Stroke Paternal Grandfather        in late 5470s   Heart attack Brother [phone]   Colon cancer Neg Hx      Current Outpatient Medications:    ALPRAZolam (XANAX) 1 MG tablet, TAKE 1 TABLET BY MOUTH THREE TIMES A DAY AS NEEDED FOR ANXIETY, Disp: 60 tablet, Rfl: 0   amiodarone (PACERONE) 200 MG tablet, Take 1 tablet (200 mg total) by mouth 2 (two) times daily. Please cancel all previous orders for current medication. Change in dosage or pill size., Disp: 180 tablet, Rfl: 3   atorvastatin (LIPITOR) 40 MG tablet, TAKE 1 TABLET BY MOUTH EVERY DAY, Disp: 90 tablet, Rfl: 3   Calcium Carb-Cholecalciferol (CALCIUM 600 + D PO), Take 1 tablet by mouth daily., Disp: , Rfl:    cholecalciferol (VITAMIN D3) 25 MCG (1000 UNIT) tablet, Take 1,000 Units by mouth once a week. For 12 weeks, Disp: , Rfl:    clindamycin (CLEOCIN T) 1 % lotion, Apply 1 application  topically 2 (two) times daily as needed (rosacea)., Disp: , Rfl:    clobetasol (TEMOVATE) 0.05 % external solution, Apply 1 application topically daily as needed (rosacea)., Disp: , Rfl:    cyclobenzaprine (FLEXERIL) 5 MG tablet, Take 5 mg by mouth at bedtime as needed for muscle spasms., Disp: , Rfl:    ELIQUIS 5 MG TABS tablet, TAKE 1 TABLET BY MOUTH TWICE A DAY, Disp: 180 tablet, Rfl: 3   fluocinonide (LIDEX) 0.05 % external solution, Apply 1 application topically daily as needed (rosacea)., Disp: , Rfl:    FLUoxetine (PROZAC) 40 MG  capsule, TAKE 1 CAPSULE BY MOUTH EVERY DAY, Disp: 90 capsule, Rfl: 0   furosemide (LASIX) 40 MG tablet, Take 1 tablet (40 mg total) by mouth daily., Disp: 90 tablet, Rfl: 1   gabapentin (NEURONTIN) 300 MG capsule, Take 1 capsule (300 mg total) by mouth at bedtime., Disp: 90 capsule, Rfl: 1   HYDROcodone-acetaminophen (NORCO/VICODIN) 5-325 MG tablet, Take 1 tablet by mouth every 6 (six) hours as needed for moderate pain.,  Disp: 30 tablet, Rfl: 0   methocarbamol (ROBAXIN) 500 MG tablet, Take 500 mg by mouth every 8 (eight) hours as needed., Disp: , Rfl:    metroNIDAZOLE (METROCREAM) 0.75 % cream, Apply 1 application topically 2 (two) times daily as needed (rosacea). , Disp: , Rfl:    Multiple Vitamin (MULTIVITAMIN WITH MINERALS) TABS tablet, Take 1 tablet by mouth daily. Centrum Silver, Disp: , Rfl:    omeprazole (PRILOSEC) 40 MG capsule, TAKE 1 CAPSULE BY MOUTH EVERY DAY, Disp: 90 capsule, Rfl: 1   potassium chloride (KLOR-CON) 10 MEQ tablet, Take 1 tablet (10 mEq total) by mouth daily. Please keeps scheduled appointment for future refills. Thank you., Disp: 90 tablet, Rfl: 0   sacubitril-valsartan (ENTRESTO) 49-51 MG, Take 1 tablet by mouth 2 (two) times daily., Disp: 60 tablet, Rfl: 11   nitroGLYCERIN (NITROSTAT) 0.4 MG SL tablet, Place 1 tablet (0.4 mg total) under the tongue every 5 (five) minutes as needed for chest pain., Disp: 90 tablet, Rfl: 3  Review of Systems:  Negative unless indicated in HPI.   Physical Exam: Vitals:   12/28/22 1009 12/28/22 1016  BP: (!) 151/75 (!) 153/75  Pulse: 63   Temp: (!) 97.5 F (36.4 C)   TempSrc: Other (Comment)   Weight: 299 lb 9.6 oz (135.9 kg)     Body mass index is 48.36 kg/m.   Physical Exam Vitals reviewed.  Constitutional:      Appearance: Normal appearance.  HENT:     Head: Normocephalic and atraumatic.  Eyes:     Conjunctiva/sclera: Conjunctivae normal.     Pupils: Pupils are equal, round, and reactive to light.  Musculoskeletal:      Right shoulder: Tenderness present. Decreased range of motion.     Left shoulder: Normal.     Right upper arm: Tenderness present.     Left upper arm: Normal.     Right elbow: Decreased range of motion.     Left elbow: Normal.  Skin:    General: Skin is warm and dry.  Neurological:     General: No focal deficit present.     Mental Status: She is alert and oriented to person, place, and time.  Psychiatric:        Mood and Affect: Mood normal.        Behavior: Behavior normal.        Thought Content: Thought content normal.        Judgment: Judgment normal.      Impression and Plan:  Right arm pain - Plan: DG Shoulder Right, DG Elbow Complete Right, DG Humerus Right  Fall, subsequent encounter - Plan: DG Shoulder Right, DG Elbow Complete Right, DG Humerus Right  -X-ray of right humerus, elbow, shoulder today, if fracture present refer to orthopedics, otherwise conservative management.  Time spent:30 minutes reviewing chart, interviewing and examining patient and formulating plan of care.     Chaya Jan, MD [facility] at [facility]

## 2022-12-31 ENCOUNTER — Encounter: Payer: Self-pay | Admitting: Internal Medicine

## 2022-12-31 ENCOUNTER — Other Ambulatory Visit: Payer: Self-pay | Admitting: Internal Medicine

## 2022-12-31 DIAGNOSIS — F419 Anxiety disorder, unspecified: Secondary | ICD-10-CM

## 2023-01-02 ENCOUNTER — Encounter (HOSPITAL_COMMUNITY): Payer: Medicare Other

## 2023-01-04 ENCOUNTER — Encounter (HOSPITAL_COMMUNITY)
Admission: RE | Admit: 2023-01-04 | Discharge: 2023-01-04 | Disposition: A | Discharge status: Home / Self Care | Payer: Medicare Other | Source: Ambulatory Visit | Attending: Cardiology | Admitting: Cardiology

## 2023-01-04 DIAGNOSIS — I5032 Chronic diastolic (congestive) heart failure: Secondary | ICD-10-CM | POA: Diagnosis not present

## 2023-01-04 NOTE — Progress Notes (Signed)
Daily Session Note  Patient Details  Name: Kathy [name] MRN: [id number] Date of Birth: 1950/12/17 Referring Provider:   Doristine Devoid Pulmonary Rehab Walk Test from 11/24/2022 in [facility]  Referring Provider Sabharwal       Encounter Date: 01/04/2023  Check In:  Session Check In - 01/04/23 1442       Check-In   Supervising physician immediately available to respond to emergencies [facility] MD immediately available    Physician(s) Eligha Bridegroom NP    [facility]    Staff Present Essie Hart, RN, Doris Cheadle, MS, ACSM-CEP, Exercise Physiologist;Samantha Belarus, RD, LDN;Kendon Sedeno, RT;Randi Reeve BS, ACSM-CEP, Exercise Physiologist    Virtual Visit No    Medication changes reported     No    Fall or balance concerns reported    Yes    Comments Pt fell a week ago    Tobacco Cessation No Change    Warm-up and Cool-down Performed as group-led instruction    Resistance Training Performed Yes    VAD Patient? No    PAD/SET Patient? No      Pain Assessment   Currently in Pain? No/denies    Multiple Pain Sites No             Capillary Blood Glucose: No results found for this or any previous visit (from the past 24 hour(s)).    Social History   Tobacco Use  Smoking Status Never  Smokeless Tobacco Never    Goals Met:  Proper associated with RPD/PD & O2 Sat Independence with exercise equipment Exercise tolerated well No report of concerns or symptoms today Strength training completed today  Goals Unmet:  Not Applicable  Comments: Service time is from 1306 to 1453.    Dr. Mechele Collin is Medical Director for Pulmonary Rehab at [facility].

## 2023-01-09 ENCOUNTER — Encounter (HOSPITAL_COMMUNITY): Payer: Medicare Other

## 2023-01-10 ENCOUNTER — Encounter: Payer: Self-pay | Admitting: Internal Medicine

## 2023-01-11 ENCOUNTER — Encounter (HOSPITAL_COMMUNITY)
Admission: RE | Admit: 2023-01-11 | Discharge: 2023-01-11 | Disposition: A | Discharge status: Home / Self Care | Payer: Medicare Other | Source: Ambulatory Visit | Attending: Cardiology | Admitting: Cardiology

## 2023-01-11 DIAGNOSIS — I5032 Chronic diastolic (congestive) heart failure: Secondary | ICD-10-CM | POA: Diagnosis not present

## 2023-01-16 ENCOUNTER — Encounter (HOSPITAL_COMMUNITY)
Admission: RE | Admit: 2023-01-16 | Discharge: 2023-01-16 | Disposition: A | Discharge status: Home / Self Care | Payer: Medicare Other | Source: Ambulatory Visit | Attending: Cardiology | Admitting: Cardiology

## 2023-01-16 ENCOUNTER — Other Ambulatory Visit: Payer: Self-pay | Admitting: Internal Medicine

## 2023-01-16 VITALS — Wt 297.0 lb

## 2023-01-16 DIAGNOSIS — I5032 Chronic diastolic (congestive) heart failure: Secondary | ICD-10-CM | POA: Diagnosis not present

## 2023-01-16 DIAGNOSIS — F419 Anxiety disorder, unspecified: Secondary | ICD-10-CM

## 2023-01-16 MED ORDER — ALPRAZOLAM 1 MG PO TABS
1.0000 mg | ORAL_TABLET | Freq: Three times a day (TID) | ORAL | 0 refills | Status: DC | PRN
Start: 2023-01-16 — End: 2023-01-29

## 2023-01-16 NOTE — Progress Notes (Signed)
Daily Session Note  Patient Details  Name: Kathy [name] MRN: [id number] Date of Birth: Dec 13, 1950 Referring Provider:   Doristine Devoid Pulmonary Rehab Walk Test from 11/24/2022 in [facility]  Referring Provider Sabharwal       Encounter Date: 01/16/2023  Check In:  Session Check In - 01/16/23 1437       Check-In   Supervising physician immediately available to respond to emergencies [facility] MD immediately available    Physician(s) Robin Searing, NP    [facility]    Staff Present Essie Hart, RN, Doris Cheadle, MS, ACSM-CEP, Exercise Physiologist;Calysta Craigo Erin Sons BS, ACSM-CEP, Exercise Physiologist;Olinty Peggye Pitt, MS, ACSM-CEP, Exercise Physiologist    Virtual Visit No    Medication changes reported     No    Fall or balance concerns reported    No    Tobacco Cessation No Change    Warm-up and Cool-down Performed as group-led instruction    Resistance Training Performed Yes    VAD Patient? No    PAD/SET Patient? No      Pain Assessment   Currently in Pain? No/denies    Pain Score 0-No pain    Multiple Pain Sites No             Capillary Blood Glucose: No results found for this or any previous visit (from the past 24 hour(s)).   Exercise Prescription Changes - 01/16/23 1500       Response to Exercise   Blood Pressure (Admit) 124/72    Blood Pressure (Exercise) 128/70    Blood Pressure (Exit) 120/70    Heart Rate (Admit) 62 bpm    Heart Rate (Exercise) 81 bpm    Heart Rate (Exit) 63 bpm    Oxygen Saturation (Admit) 95 %    Oxygen Saturation (Exercise) 95 %    Oxygen Saturation (Exit) 93 %    Rating of Perceived Exertion (Exercise) 13    Perceived Dyspnea (Exercise) 1    Duration Continue with 30 min of aerobic exercise without signs/symptoms of physical distress.    Intensity THRR unchanged      Progression   Progression Continue to progress workloads to  maintain intensity without signs/symptoms of physical distress.      Resistance Training   Training Prescription Yes    Weight red bands    Reps 10-15    Time 10 Minutes      Recumbant Bike   Level 2    Minutes 15    METs 1.6      NuStep   Level 3    Minutes 15    METs 2.1             Social History   Tobacco Use  Smoking Status Never  Smokeless Tobacco Never    Goals Met:  Proper associated with RPD/PD & O2 Sat Independence with exercise equipment Exercise tolerated well No report of concerns or symptoms today Strength training completed today  Goals Unmet:  Not Applicable  Comments: Service time is from 1317 to 1451.    Dr. Mechele Collin is Medical Director for Pulmonary Rehab at [facility].

## 2023-01-18 ENCOUNTER — Encounter (HOSPITAL_COMMUNITY): Payer: Medicare Other

## 2023-01-22 NOTE — Addendum Note (Signed)
Encounter addended by: Essie Hart, RN on: 01/22/2023 9:57 AM  Actions taken: Flowsheet accepted

## 2023-01-22 NOTE — Addendum Note (Signed)
Encounter addended by: Guss Bunde, RRT on: 01/22/2023 9:57 AM  Actions taken: Flowsheet accepted

## 2023-01-23 ENCOUNTER — Encounter (HOSPITAL_COMMUNITY)
Admission: RE | Admit: 2023-01-23 | Discharge: 2023-01-23 | Disposition: A | Discharge status: Home / Self Care | Payer: Medicare Other | Source: Ambulatory Visit | Attending: Cardiology | Admitting: Cardiology

## 2023-01-23 VITALS — Wt 294.1 lb

## 2023-01-23 DIAGNOSIS — I5032 Chronic diastolic (congestive) heart failure: Secondary | ICD-10-CM | POA: Insufficient documentation

## 2023-01-23 NOTE — Progress Notes (Signed)
Daily Session Note  Patient Details  Name: Kathy [name] MRN: [id number] Date of Birth: 1951/07/08 Referring Provider:   Doristine Devoid Pulmonary Rehab Walk Test from 11/24/2022 in [facility]  Referring Provider Sabharwal       Encounter Date: 01/23/2023  Check In:  Session Check In - 01/23/23 1439       Check-In   Supervising physician immediately available to respond to emergencies [facility] MD immediately available    Physician(s) Carlos Levering, NP    [facility]    Staff Present Essie Hart, RN, Doris Cheadle, MS, ACSM-CEP, Exercise Physiologist;Casey Erin Sons BS, ACSM-CEP, Exercise Physiologist;Olinty Peggye Pitt, MS, ACSM-CEP, Exercise Physiologist;Samantha Belarus, RD, LDN;Carlette Les Pou, RN, BSN    Virtual Visit No    Medication changes reported     No    Fall or balance concerns reported    No    Tobacco Cessation No Change    Warm-up and Cool-down Performed as group-led Writer Performed Yes    VAD Patient? No    PAD/SET Patient? No      Pain Assessment   Currently in Pain? No/denies    Pain Score 0-No pain    Multiple Pain Sites No             Capillary Blood Glucose: No results found for this or any previous visit (from the past 24 hour(s)).    Social History   Tobacco Use  Smoking Status Never  Smokeless Tobacco Never    Goals Met:  Independence with exercise equipment Exercise tolerated well No report of concerns or symptoms today Strength training completed today  Goals Unmet:  Not Applicable  Comments: Service time is from 1315 to 1455    Dr. Mechele Collin is Medical Director for Pulmonary Rehab at [facility].

## 2023-01-24 ENCOUNTER — Ambulatory Visit: Payer: Medicare Other | Attending: Cardiology | Admitting: Cardiology

## 2023-01-24 ENCOUNTER — Encounter: Payer: Self-pay | Admitting: Cardiology

## 2023-01-24 VITALS — BP 122/57 | HR 60 | Ht 66.0 in | Wt 279.2 lb

## 2023-01-24 DIAGNOSIS — G4733 Obstructive sleep apnea (adult) (pediatric): Secondary | ICD-10-CM

## 2023-01-24 DIAGNOSIS — I10 Essential (primary) hypertension: Secondary | ICD-10-CM

## 2023-01-24 NOTE — Progress Notes (Signed)
Sleep Medicine CONSULT Note    Date:  01/24/2023   ID:  Kathy [name], DOB 08/05/1951, MRN [id number]  PCP:  Philip Aspen, Limmie Patricia, MD  Cardiologist: Kristeen Miss, MD   Chief Complaint  Patient presents with   New Patient (Initial Visit)    OSA    History of Present Illness:  Kathy [name] is a 72 y.o. female who is being seen today for the evaluation of obstructive sleep apnea at the request of Dorthula Nettles, MD.  This is a 72 year old obese female with a history of paroxysmal atrial fibrillation, pulmonary hypertension, essential hypertension, GERD.  She also has a history of obstructive sleep apnea and uses CPAP nightly but has recently been having some issues with her CPAP device and is now referred for sleep medicine consultation by Dr. Edmon Crape.  I remotely saw her back in 2020 but is been over 3-1/2 years since she had her initial sleep study and saw me.  She had a NPSG 10/07/2018 which showed moderate obstructive sleep apnea with an AHI of 20/h and O2 saturations as low as 79% with criteria met for nocturnal hypoxemia.  She underwent CPAP titration to 15 cm H2O.  Her last office visit with me was 07/08/2019 at which point she was doing well with an AHI of 2.8/h on 15 cm H2O with good compliance.  She subsequently was lost to follow-up and now is having problems with her machine.  She is doing well with her PAP device and thinks that she has gotten used to it.  She tolerates the nasal cushion mask and feels the pressure is adequate.  Lately she has not been sleeping well because her son is ill with lymphoma so feels tired during the day.  She denies any significant or nasal dryness or nasal congestion.  Her mouth has been getting dry at night. She does not think that she snores.  She tells me that she has been having problems with the reservoir running out of water.   Past Medical History:  Diagnosis Date   Adenomatous colon polyp    2012   Anxiety and  depression    Arthritis    Osteoarthritis, Dr Despina Hick   Cancer [facility])    skin - left foot excised   Chronic diastolic CHF (congestive heart failure) (HCC)    DDD (degenerative disc disease), cervical    Deaf, right    DVT (deep venous thrombosis) (HCC)    RLE DVT 08/2004, post right knee arthroscopy   Fibromyalgia    Dr Azzie Roup, Rheu   GERD (gastroesophageal reflux disease)    H/O hiatal hernia    Headache(784.0)    PAST HX MILD MIGRAINES - resolved per patient 08/18/20   History of kidney stones    passed stones   HTN (hypertension)    IBS (irritable bowel syndrome)    Internal hemorrhoid    Lumbar radiculopathy    Obesity    OSA on CPAP    Moderate with AHI 20/hr with nocturnal hypoxemia on CPAP   PAF (paroxysmal atrial fibrillation) (HCC) 06/2018   Pulmonary hypertension (HCC)     Past Surgical History:  Procedure Laterality Date   ABDOMINAL HYSTERECTOMY     dysfunctional menses   APPENDECTOMY     BREAST EXCISIONAL BIOPSY Right    CARDIAC CATHETERIZATION  06/26/2018   CARDIOVERSION N/A 10/10/2022   Procedure: CARDIOVERSION;  Surgeon: Dorthula Nettles, DO;  [facility];  Service: Cardiovascular;  Laterality: N/A;  CHOLECYSTECTOMY     COLONOSCOPY  05/27/2002, 08/08/11   2003 diverticulosis, hemorrhoids 2012 same + cecal polyp   EYE SURGERY     KNEE ARTHROSCOPY  2008   right   KYPHOPLASTY Bilateral 08/19/2020   Procedure: KYPHOPLASTY LUMBAR ONE;  Surgeon: Bedelia Person, MD;  Location: [facility] OR;  Service: Neurosurgery;  Laterality: Bilateral;  posterior   LEFT HEART CATH AND CORONARY ANGIOGRAPHY N/A 06/26/2018   Procedure: LEFT HEART CATH AND CORONARY ANGIOGRAPHY;  Surgeon: Iran Ouch, MD;  [facility] IN[facility] LAB;  Service: Cardiovascular;  Laterality: N/A;   MELANOMA EXCISION Left 07/2013   foot   RIGHT HEART CATH N/A 10/10/2022   Procedure: RIGHT HEART CATH;  Surgeon: Dorthula Nettles, DO;  [facility];  Service:  Cardiovascular;  Laterality: N/A;   TEE WITHOUT CARDIOVERSION N/A 10/10/2022   Procedure: TRANSESOPHAGEAL ECHOCARDIOGRAM (TEE);  Surgeon: Dorthula Nettles, DO;  Location[facility];  Service: Cardiovascular;  Laterality: N/A;   TOTAL HIP ARTHROPLASTY  02/2010   left ; Dr Despina Hick   TOTAL HIP ARTHROPLASTY  03/18/2012   Procedure: TOTAL HIP ARTHROPLASTY;  Surgeon: Loanne Drilling, MD;  Location: [facility] ORS;  Service: Orthopedics;  Laterality: Right;   UPPER GASTROINTESTINAL ENDOSCOPY  04/21/2009, 08/08/11   2010 gastritis ;2012 small hiatal hernia. Dr Leone Payor   WRIST FRACTURE SURGERY Left     Current Medications: Current Meds  Medication Sig   ALPRAZolam (XANAX) 1 MG tablet Take 1 tablet (1 mg total) by mouth 3 (three) times daily as needed for anxiety.   amiodarone (PACERONE) 200 MG tablet Take 1 tablet (200 mg total) by mouth 2 (two) times daily. Please cancel all previous orders for current medication. Change in dosage or pill size.   atorvastatin (LIPITOR) 40 MG tablet TAKE 1 TABLET BY MOUTH EVERY DAY   Calcium Carb-Cholecalciferol (CALCIUM 600 + D PO) Take 1 tablet by mouth daily.   cholecalciferol (VITAMIN D3) 25 MCG (1000 UNIT) tablet Take 1,000 Units by mouth once a week. For 12 weeks   clindamycin (CLEOCIN T) 1 % lotion Apply 1 application  topically 2 (two) times daily as needed (rosacea).   clobetasol (TEMOVATE) 0.05 % external solution Apply 1 application topically daily as needed (rosacea).   cyclobenzaprine (FLEXERIL) 5 MG tablet Take 5 mg by mouth at bedtime as needed for muscle spasms.   ELIQUIS 5 MG TABS tablet TAKE 1 TABLET BY MOUTH TWICE A DAY   fluocinonide (LIDEX) 0.05 % external solution Apply 1 application topically daily as needed (rosacea).   FLUoxetine (PROZAC) 40 MG capsule TAKE 1 CAPSULE BY MOUTH EVERY DAY   furosemide (LASIX) 40 MG tablet Take 1 tablet (40 mg total) by mouth daily.   gabapentin (NEURONTIN) 300 MG capsule Take 1 capsule (300 mg total) by mouth at  bedtime.   HYDROcodone-acetaminophen (NORCO/VICODIN) 5-325 MG tablet Take 1 tablet by mouth every 6 (six) hours as needed for moderate pain.   methocarbamol (ROBAXIN) 500 MG tablet Take 500 mg by mouth every 8 (eight) hours as needed.   metroNIDAZOLE (METROCREAM) 0.75 % cream Apply 1 application topically 2 (two) times daily as needed (rosacea).    Multiple Vitamin (MULTIVITAMIN WITH MINERALS) TABS tablet Take 1 tablet by mouth daily. Centrum Silver   omeprazole (PRILOSEC) 40 MG capsule TAKE 1 CAPSULE BY MOUTH EVERY DAY   potassium chloride (KLOR-CON) 10 MEQ tablet Take 1 tablet (10 mEq total) by mouth daily. Please keeps scheduled appointment for future refills. Thank you.  sacubitril-valsartan (ENTRESTO) 49-51 MG Take 1 tablet by mouth 2 (two) times daily.    Allergies:   Achromycin [tetracycline hcl], Penicillins, Sulfamethoxazole-trimethoprim, Nickel, and Zanaflex [tizanidine hydrochloride]   Social History   Socioeconomic History   Marital status: Widowed    Spouse name: Not on file   Number of children: 2   Years of education: Not on file   Highest education level: 12th grade  Occupational History   Occupation: retired  Tobacco Use   Smoking status: Never   Smokeless tobacco: Never  Vaping Use   Vaping Use: Never used  Substance and Sexual Activity   Alcohol use: Yes    Comment: occasional   Drug use: No   Sexual activity: Not on file    Comment: Hysterectomy  Other Topics Concern   Not on file  Social History Narrative   0 caffeine drinks, likes to read, crotchet, spend time with family & friends, very social   Social Determinants of Health   Financial Resource Strain: Low Risk  (12/27/2022)   Overall Financial Resource Strain (CARDIA)    Difficulty of Paying Living Expenses: Not hard at all  Food Insecurity: No Food Insecurity (12/28/2022)   Hunger Vital Sign    Worried About Running Out of Food in the Last Year: Never true    Ran Out of Food in the Last Year:  Never true  Transportation Needs: No Transportation Needs (12/27/2022)   PRAPARE - Administrator, Civil Service (Medical): No    Lack of Transportation (Non-Medical): No  Physical Activity: Insufficiently Active (12/27/2022)   Exercise Vital Sign    Days of Exercise per Week: 2 days    Minutes of Exercise per Session: 30 min  Stress: No Stress Concern Present (12/27/2022)   [employer] of Occupational Health - Occupational Stress Questionnaire    Feeling of Stress : Only a little  Social Connections: Moderately Integrated (12/27/2022)   Social Connection and Isolation Panel [NHANES]    Frequency of Communication with Friends and Family: More than three times a week    Frequency of Social Gatherings with Friends and Family: More than three times a week    Attends Religious Services: 1 to 4 times per year    Active Member of [employer] or Organizations: Yes    Attends Banker Meetings: 1 to 4 times per year    Marital Status: Widowed  Recent Concern: Social Connections - Moderately Isolated (11/15/2022)   Social Connection and Isolation Panel [NHANES]    Frequency of Communication with Friends and Family: More than three times a week    Frequency of Social Gatherings with Friends and Family: Three times a week    Attends Religious Services: More than 4 times per year    Active Member of Clubs or Organizations: No    Attends Banker Meetings: Never    Marital Status: Widowed     Family History:  The patient's family history includes Breast cancer in her paternal aunt; Breast cancer (age of onset: 72) in her mother; COPD in her maternal grandmother and mother; Diabetes in her father and paternal grandmother; Heart attack (age of onset: 82) in her brother; Heart attack (age of onset: 65) in her mother; Heart disease in her paternal grandmother; Heart failure (age of onset: 46) in her father; Hypertension in her father; Stroke in her paternal grandfather.    ROS:   Please see the history of present illness.    ROS All other  systems reviewed and are negative.      No data to display             PHYSICAL EXAM:   VS:  BP (!) 122/57 (BP Location: Right Wrist)   Pulse 60   Ht 5\' 6"  (1.676 m)   Wt 279 lb 3.2 oz (126.6 kg)   SpO2 96%   BMI 45.06 kg/m    GEN: Well nourished, well developed, in no acute distress  HEENT: normal  Neck: no JVD, carotid bruits, or masses Cardiac: RRR; no murmurs, rubs, or gallops,no edema.  Intact distal pulses bilaterally.  Respiratory:  clear to auscultation bilaterally, normal work of breathing GI: soft, nontender, nondistended, + BS MS: no deformity or atrophy  Skin: warm and dry, no rash Neuro:  Alert and Oriented x 3, Strength and sensation are intact Psych: euthymic mood, full affect  Wt Readings from Last 3 Encounters:  01/24/23 279 lb 3.2 oz (126.6 kg)  01/16/23 296 lb 15.4 oz (134.7 kg)  12/28/22 299 lb 9.6 oz (135.9 kg)      Studies/Labs Reviewed:   PSG, CPAP titration, PAP compliance download  Recent Labs: 10/13/2022: B Natriuretic Peptide 534.5; Magnesium 1.8 11/15/2022: ALT 16; BUN 25; Creatinine, Ser 1.18; Hemoglobin 12.6; Platelets 212.0; Potassium 4.0; Sodium 138; TSH 4.33    Additional studies/ records that were reviewed today include:  none    ASSESSMENT:    1. OSA on CPAP   2. Essential hypertension      PLAN:  In order of problems listed above:  OSA - The patient is tolerating PAP therapy well without any problems. The PAP download performed by his DME was personally reviewed and interpreted by me today and showed an AHI of 4.3 /hr on 15 cm H2O with 100% compliance in using more than 4 hours nightly.  The patient has been using and benefiting from PAP use and will continue to benefit from therapy.  -Her device will be up for renewal in the fall 2025. -I have recommended that she add a humidifier to her bedroom to see if it helps prevent her from running out of  water in her water chamber>>if this does not work then she will call me and we wil order a new device  2.  Hypertension -Continue prescription drug management with Entresto 49-51 mg twice daily with as needed refills   Time Spent: 20 minutes total time of encounter, including 15 minutes spent in face-to-face patient care on the date of this encounter. This time includes coordination of care and counseling regarding above mentioned problem list. Remainder of non-face-to-face time involved reviewing chart documents/testing relevant to the patient encounter and documentation in the medical record. I have independently reviewed documentation from referring provider  Medication Adjustments/Labs and Tests Ordered: Current medicines are reviewed at length with the patient today.  Concerns regarding medicines are outlined above.  Medication changes, Labs and Tests ordered today are listed in the Patient Instructions below.  Patient Instructions  Medication Instructions:  Your physician recommends that you continue on your current medications as directed. Please refer to the Current Medication list given to you today.  *If you need a refill on your cardiac medications before your next appointment, please call your pharmacy*   Lab Work: None.  If you have labs (blood work) drawn today and your tests are completely normal, you will receive your results only by: MyChart Message (if you have MyChart) OR A paper copy in the mail If you have  any lab test that is abnormal or we need to change your treatment, we will call you to review the results.   Testing/Procedures: None.   Follow-Up:    Your next appointment:   1 year(s)  Provider:   Dr. Armanda Magic, MD       Signed, Armanda Magic, MD  01/24/2023 3:53 PM    [facility] [address] [place], [place], [place]  [address] Phone: [phone]; Fax: [phone]

## 2023-01-24 NOTE — Patient Instructions (Signed)
Medication Instructions:  Your physician recommends that you continue on your current medications as directed. Please refer to the Current Medication list given to you today.  *If you need a refill on your cardiac medications before your next appointment, please call your pharmacy*   Lab Work: None.  If you have labs (blood work) drawn today and your tests are completely normal, you will receive your results only by: MyChart Message (if you have MyChart) OR A paper copy in the mail If you have any lab test that is abnormal or we need to change your treatment, we will call you to review the results.   Testing/Procedures: None.   Follow-Up:  Your next appointment:   1 year(s)  Provider:   Dr. Traci Turner, MD   

## 2023-01-25 ENCOUNTER — Encounter (HOSPITAL_COMMUNITY): Payer: Medicare Other

## 2023-01-27 ENCOUNTER — Other Ambulatory Visit: Payer: Self-pay | Admitting: Internal Medicine

## 2023-01-27 DIAGNOSIS — F419 Anxiety disorder, unspecified: Secondary | ICD-10-CM

## 2023-01-30 ENCOUNTER — Encounter (HOSPITAL_COMMUNITY): Payer: Medicare Other

## 2023-01-31 NOTE — Progress Notes (Signed)
Pulmonary Individual Treatment Plan  Patient Details  Name: Kathy [name] MRN: [id number] Date of Birth: 1951/01/24 Referring Provider:   Doristine Devoid Pulmonary Rehab Walk Test from 11/24/2022 in [facility]  Referring Provider Sabharwal       Initial Encounter Date:  Flowsheet Row Pulmonary Rehab Walk Test from 11/24/2022 in [facility]  Date 11/24/22       Visit Diagnosis: Chronic diastolic CHF (congestive heart failure) (HCC)  Patient's Home Medications on Admission:   Current Outpatient Medications:    ALPRAZolam (XANAX) 1 MG tablet, TAKE 1 TABLET BY MOUTH THREE TIMES A DAY AS NEEDED FOR ANXIETY, Disp: 90 tablet, Rfl: 0   amiodarone (PACERONE) 200 MG tablet, Take 1 tablet (200 mg total) by mouth 2 (two) times daily. Please cancel all previous orders for current medication. Change in dosage or pill size., Disp: 180 tablet, Rfl: 3   atorvastatin (LIPITOR) 40 MG tablet, TAKE 1 TABLET BY MOUTH EVERY DAY, Disp: 90 tablet, Rfl: 3   Calcium Carb-Cholecalciferol (CALCIUM 600 + D PO), Take 1 tablet by mouth daily., Disp: , Rfl:    cholecalciferol (VITAMIN D3) 25 MCG (1000 UNIT) tablet, Take 1,000 Units by mouth once a week. For 12 weeks, Disp: , Rfl:    clindamycin (CLEOCIN T) 1 % lotion, Apply 1 application  topically 2 (two) times daily as needed (rosacea)., Disp: , Rfl:    clobetasol (TEMOVATE) 0.05 % external solution, Apply 1 application topically daily as needed (rosacea)., Disp: , Rfl:    cyclobenzaprine (FLEXERIL) 5 MG tablet, Take 5 mg by mouth at bedtime as needed for muscle spasms., Disp: , Rfl:    ELIQUIS 5 MG TABS tablet, TAKE 1 TABLET BY MOUTH TWICE A DAY, Disp: 180 tablet, Rfl: 3   fluocinonide (LIDEX) 0.05 % external solution, Apply 1 application topically daily as needed (rosacea)., Disp: , Rfl:    FLUoxetine (PROZAC) 40 MG capsule, TAKE 1 CAPSULE BY  MOUTH EVERY DAY, Disp: 90 capsule, Rfl: 1   furosemide (LASIX) 40 MG tablet, Take 1 tablet (40 mg total) by mouth daily., Disp: 90 tablet, Rfl: 1   gabapentin (NEURONTIN) 300 MG capsule, Take 1 capsule (300 mg total) by mouth at bedtime., Disp: 90 capsule, Rfl: 1   HYDROcodone-acetaminophen (NORCO/VICODIN) 5-325 MG tablet, Take 1 tablet by mouth every 6 (six) hours as needed for moderate pain., Disp: 30 tablet, Rfl: 0   methocarbamol (ROBAXIN) 500 MG tablet, Take 500 mg by mouth every 8 (eight) hours as needed., Disp: , Rfl:    metroNIDAZOLE (METROCREAM) 0.75 % cream, Apply 1 application topically 2 (two) times daily as needed (rosacea). , Disp: , Rfl:    Multiple Vitamin (MULTIVITAMIN WITH MINERALS) TABS tablet, Take 1 tablet by mouth daily. Centrum Silver, Disp: , Rfl:    nitroGLYCERIN (NITROSTAT) 0.4 MG SL tablet, Place 1 tablet (0.4 mg total) under the tongue every 5 (five) minutes as needed for chest pain., Disp: 90 tablet, Rfl: 3   omeprazole (PRILOSEC) 40 MG capsule, TAKE 1 CAPSULE BY MOUTH EVERY DAY, Disp: 90 capsule, Rfl: 1   potassium chloride (KLOR-CON) 10 MEQ tablet, Take 1 tablet (10 mEq total) by mouth daily. Please keeps scheduled appointment for future refills. Thank you., Disp: 90 tablet, Rfl: 0   sacubitril-valsartan (ENTRESTO) 49-51 MG, Take 1 tablet by mouth 2 (two) times daily., Disp: 60 tablet, Rfl: 11  Past Medical History: Past  Medical History:  Diagnosis Date   Adenomatous colon polyp    2012   Anxiety and depression    Arthritis    Osteoarthritis, Dr Despina Hick   Cancer [facility])    skin - left foot excised   Chronic diastolic CHF (congestive heart failure) (HCC)    DDD (degenerative disc disease), cervical    Deaf, right    DVT (deep venous thrombosis) (HCC)    RLE DVT 08/2004, post right knee arthroscopy   Fibromyalgia    Dr Azzie Roup, Rheu   GERD (gastroesophageal reflux disease)    H/O hiatal hernia    Headache(784.0)    PAST HX MILD MIGRAINES - resolved per  patient 08/18/20   History of kidney stones    passed stones   HTN (hypertension)    IBS (irritable bowel syndrome)    Internal hemorrhoid    Lumbar radiculopathy    Obesity    OSA on CPAP    Moderate with AHI 20/hr with nocturnal hypoxemia on CPAP   PAF (paroxysmal atrial fibrillation) (HCC) 06/2018   Pulmonary hypertension (HCC)     Tobacco Use: Social History   Tobacco Use  Smoking Status Never  Smokeless Tobacco Never    Labs: Review Flowsheet  More data exists      Latest Ref Rng & Units 06/26/2018 10/12/2021 07/21/2022 10/10/2022 11/15/2022  Labs for ITP Cardiac and Pulmonary Rehab  Cholestrol 0 - 200 mg/dL - 130  - - 865   LDL (calc) 0 - 99 mg/dL - 71  - - 90   HDL-C >78.46 mg/dL - 96.29  - - 52.84   Trlycerides 0.0 - 149.0 mg/dL - 13.2  - - 44.0   Hemoglobin A1c 4.6 - 6.5 % 5.4  5.8  - - 5.7   Bicarbonate 20.0 - 28.0 mmol/L 20.0 - 28.0 mmol/L - - - 28.9  28.5  -  TCO2 22 - 32 mmol/L 22 - 32 mmol/L - - 29  30  30   -  O2 Saturation % % - - - 57  55  -    Capillary Blood Glucose: Lab Results  Component Value Date   GLUCAP 74 07/27/2022   GLUCAP 93 07/27/2022   GLUCAP 113 (H) 06/25/2018     Pulmonary Assessment Scores:  Pulmonary Assessment Scores     Row Name 11/24/22 1410         ADL UCSD   SOB Score total 40       CAT Score   CAT Score 12       mMRC Score   mMRC Score 3             UCSD: Self-administered rating of dyspnea associated with activities of daily living (ADLs) 6-point scale (0 = "not at all" to 5 = "maximal or unable to do because of breathlessness")  Scoring Scores range from 0 to 120.  Minimally important difference is 5 units  CAT: CAT can identify the health impairment of COPD patients and is better correlated with disease progression.  CAT has a scoring range of zero to 40. The CAT score is classified into four groups of low (less than 10), medium (10 - 20), high (21-30) and very high (31-40) based on the impact level of  disease on health status. A CAT score over 10 suggests significant symptoms.  A worsening CAT score could be explained by an exacerbation, poor medication adherence, poor inhaler technique, or progression of COPD or comorbid conditions.  CAT MCID is  2 points  mMRC: mMRC (Modified Medical Research Council) Dyspnea Scale is used to assess the degree of baseline functional disability in patients of respiratory disease due to dyspnea. No minimal important difference is established. A decrease in score of 1 point or greater is considered a positive change.   Pulmonary Function Assessment:  Pulmonary Function Assessment - 11/24/22 1410       Breath   Bilateral Breath Sounds Clear    Shortness of Breath No;Limiting activity             Exercise Target Goals: Exercise Program Goal: Individual exercise prescription set using results from initial 6 min walk test and THRR while considering  patient's activity barriers and safety.   Exercise Prescription Goal: Initial exercise prescription builds to 30-45 minutes a day of aerobic activity, 2-3 days per week.  Home exercise guidelines will be given to patient during program as part of exercise prescription that the participant will acknowledge.  Activity Barriers & Risk Stratification:  Activity Barriers & Cardiac Risk Stratification - 11/24/22 1408       Activity Barriers & Cardiac Risk Stratification   Activity Barriers Deconditioning;Muscular Weakness;Shortness of Breath;Back Problems;Fibromyalgia;History of Falls             6 Minute Walk:  6 Minute Walk     Row Name 11/24/22 1503         6 Minute Walk   Phase Initial     Distance 600 feet     Walk Time 6 minutes     # of Rest Breaks 1     MPH 1.14     METS 0.84     RPE 12     Perceived Dyspnea  2     VO2 Peak 2.94     Resting HR 64 bpm     Resting BP 136/72     Resting Oxygen Saturation  95 %     Exercise Oxygen Saturation  during 6 min walk 90 %     Max Ex. HR  91 bpm     Max Ex. BP 193/77     2 Minute Post BP 149/79       Interval HR   1 Minute HR 78     2 Minute HR 84     3 Minute HR 88     4 Minute HR 88     5 Minute HR 87     6 Minute HR 91     2 Minute Post HR 69     Interval Heart Rate? Yes              Oxygen Initial Assessment:  Oxygen Initial Assessment - 11/24/22 1409       Home Oxygen   Home Oxygen Device None    Home Exercise Oxygen Prescription None    Home Resting Oxygen Prescription None             Oxygen Re-Evaluation:  Oxygen Re-Evaluation     Row Name 11/24/22 1409 11/28/22 0919 12/18/22 1526 01/23/23 0928       Program Oxygen Prescription   Program Oxygen Prescription None None None None      Home Oxygen   Home Oxygen Device -- None None None    Sleep Oxygen Prescription None None None None    Home Exercise Oxygen Prescription -- None None None    Home Resting Oxygen Prescription -- None None None    Compliance with Home Oxygen Use -- -- -- --  Goals/Expected Outcomes   Short Term Goals To learn and understand importance of maintaining oxygen saturations>88%;To learn and demonstrate proper pursed lip breathing techniques or other breathing techniques.  To learn and understand importance of maintaining oxygen saturations>88%;To learn and demonstrate proper pursed lip breathing techniques or other breathing techniques.  To learn and understand importance of maintaining oxygen saturations>88%;To learn and demonstrate proper pursed lip breathing techniques or other breathing techniques.  To learn and understand importance of maintaining oxygen saturations>88%;To learn and demonstrate proper pursed lip breathing techniques or other breathing techniques.     Long  Term Goals Maintenance of O2 saturations>88%;Exhibits proper breathing techniques, such as pursed lip breathing or other method taught during program session Maintenance of O2 saturations>88%;Exhibits proper breathing techniques, such as  pursed lip breathing or other method taught during program session Maintenance of O2 saturations>88%;Exhibits proper breathing techniques, such as pursed lip breathing or other method taught during program session Maintenance of O2 saturations>88%;Exhibits proper breathing techniques, such as pursed lip breathing or other method taught during program session    Goals/Expected Outcomes -- Compliance and understanding of oxygen saturation monitoring and breathing techniques to decrease shortness of breath Compliance and understanding of oxygen saturation monitoring and breathing techniques to decrease shortness of breath Compliance and understanding of oxygen saturation monitoring and breathing techniques to decrease shortness of breath             Oxygen Discharge (Final Oxygen Re-Evaluation):  Oxygen Re-Evaluation - 01/23/23 0928       Program Oxygen Prescription   Program Oxygen Prescription None      Home Oxygen   Home Oxygen Device None    Sleep Oxygen Prescription None    Home Exercise Oxygen Prescription None    Home Resting Oxygen Prescription None      Goals/Expected Outcomes   Short Term Goals To learn and understand importance of maintaining oxygen saturations>88%;To learn and demonstrate proper pursed lip breathing techniques or other breathing techniques.     Long  Term Goals Maintenance of O2 saturations>88%;Exhibits proper breathing techniques, such as pursed lip breathing or other method taught during program session    Goals/Expected Outcomes Compliance and understanding of oxygen saturation monitoring and breathing techniques to decrease shortness of breath             Initial Exercise Prescription:  Initial Exercise Prescription - 11/24/22 1500       Date of Initial Exercise RX and Referring Provider   Date 11/24/22    Referring Provider Sabharwal    Expected Discharge Date 02/20/23      NuStep   Level 1    SPM 50    Minutes 30      Prescription Details    Frequency (times per week) 2    Duration Progress to 30 minutes of continuous aerobic without signs/symptoms of physical distress      Intensity   THRR 40-80% of Max Heartrate 60-120    Ratings of Perceived Exertion 11-13    Perceived Dyspnea 0-4      Progression   Progression Continue progressive overload as per policy without signs/symptoms or physical distress.      Resistance Training   Training Prescription Yes    Weight red bands    Reps 10-15             Perform Capillary Blood Glucose checks as needed.  Exercise Prescription Changes:   Exercise Prescription Changes     Row Name 12/05/22 1500 12/19/22 1500 12/28/22 1509 01/16/23  1500 01/23/23 1507     Response to Exercise   Blood Pressure (Admit) 118/70 110/60 118/70 124/72 94/50   Blood Pressure (Exercise) 120/58 130/70 -- 128/70 --   Blood Pressure (Exit) 124/70 104/50 112/64 120/70 110/58   Heart Rate (Admit) 62 bpm 61 bpm 58 bpm 62 bpm 60 bpm   Heart Rate (Exercise) 66 bpm 78 bpm 78 bpm 81 bpm 77 bpm   Heart Rate (Exit) 60 bpm 66 bpm 59 bpm 63 bpm 61 bpm   Oxygen Saturation (Admit) 93 % 92 % 92 % 95 % 94 %   Oxygen Saturation (Exercise) 93 % 92 % 90 % 95 % 91 %   Oxygen Saturation (Exit) 92 % 98 % 93 % 93 % 90 %   Rating of Perceived Exertion (Exercise) 10 9 11 13 15    Perceived Dyspnea (Exercise) 1 0 0 1 1   Duration Progress to 30 minutes of  aerobic without signs/symptoms of physical distress Continue with 30 min of aerobic exercise without signs/symptoms of physical distress. Continue with 30 min of aerobic exercise without signs/symptoms of physical distress. Continue with 30 min of aerobic exercise without signs/symptoms of physical distress. Continue with 30 min of aerobic exercise without signs/symptoms of physical distress.   Intensity THRR unchanged THRR unchanged THRR unchanged THRR unchanged THRR unchanged     Progression   Progression Continue to progress workloads to maintain intensity without  signs/symptoms of physical distress. Continue to progress workloads to maintain intensity without signs/symptoms of physical distress. Continue to progress workloads to maintain intensity without signs/symptoms of physical distress. Continue to progress workloads to maintain intensity without signs/symptoms of physical distress. Continue to progress workloads to maintain intensity without signs/symptoms of physical distress.     Resistance Training   Training Prescription Yes Yes Yes Yes Yes   Weight red bands red bands red bands red bands red bands   Reps 10-15 10-15 10-15 10-15 10-15   Time 10 Minutes 10 Minutes 10 Minutes 10 Minutes 10 Minutes     Recumbant Bike   Level -- -- -- 2 2   Minutes -- -- -- 15 15   METs -- -- -- 1.6 2     NuStep   Level 1 2 2 3 2    SPM 50 50 50 -- 60   Minutes 30 30 30 15 15    METs -- 1.9 2 2.1 2.1            Exercise Comments:   Exercise Goals and Review:   Exercise Goals     Row Name 11/24/22 1409 11/28/22 0918 12/18/22 1524 01/23/23 0922       Exercise Goals   Increase Physical Activity Yes Yes Yes Yes    Intervention Provide advice, education, support and counseling about physical activity/exercise needs.;Develop an individualized exercise prescription for aerobic and resistive training based on initial evaluation findings, risk stratification, comorbidities and participant's personal goals. Provide advice, education, support and counseling about physical activity/exercise needs.;Develop an individualized exercise prescription for aerobic and resistive training based on initial evaluation findings, risk stratification, comorbidities and participant's personal goals. Provide advice, education, support and counseling about physical activity/exercise needs.;Develop an individualized exercise prescription for aerobic and resistive training based on initial evaluation findings, risk stratification, comorbidities and participant's personal goals.  Provide advice, education, support and counseling about physical activity/exercise needs.;Develop an individualized exercise prescription for aerobic and resistive training based on initial evaluation findings, risk stratification, comorbidities and participant's personal goals.    Expected  Outcomes Short Term: Attend rehab on a regular basis to increase amount of physical activity.;Long Term: Exercising regularly at least 3-5 days a week.;Long Term: Add in home exercise to make exercise part of routine and to increase amount of physical activity. Short Term: Attend rehab on a regular basis to increase amount of physical activity.;Long Term: Exercising regularly at least 3-5 days a week.;Long Term: Add in home exercise to make exercise part of routine and to increase amount of physical activity. Short Term: Attend rehab on a regular basis to increase amount of physical activity.;Long Term: Exercising regularly at least 3-5 days a week.;Long Term: Add in home exercise to make exercise part of routine and to increase amount of physical activity. Short Term: Attend rehab on a regular basis to increase amount of physical activity.;Long Term: Exercising regularly at least 3-5 days a week.;Long Term: Add in home exercise to make exercise part of routine and to increase amount of physical activity.    Increase Strength and Stamina Yes Yes Yes Yes    Intervention Provide advice, education, support and counseling about physical activity/exercise needs.;Develop an individualized exercise prescription for aerobic and resistive training based on initial evaluation findings, risk stratification, comorbidities and participant's personal goals. Provide advice, education, support and counseling about physical activity/exercise needs.;Develop an individualized exercise prescription for aerobic and resistive training based on initial evaluation findings, risk stratification, comorbidities and participant's personal goals. Provide  advice, education, support and counseling about physical activity/exercise needs.;Develop an individualized exercise prescription for aerobic and resistive training based on initial evaluation findings, risk stratification, comorbidities and participant's personal goals. Provide advice, education, support and counseling about physical activity/exercise needs.;Develop an individualized exercise prescription for aerobic and resistive training based on initial evaluation findings, risk stratification, comorbidities and participant's personal goals.    Expected Outcomes Short Term: Increase workloads from initial exercise prescription for resistance, speed, and METs.;Short Term: Perform resistance training exercises routinely during rehab and add in resistance training at home;Long Term: Improve cardiorespiratory fitness, muscular endurance and strength as measured by increased METs and functional capacity ( ) Short Term: Increase workloads from initial exercise prescription for resistance, speed, and METs.;Short Term: Perform resistance training exercises routinely during rehab and add in resistance training at home;Long Term: Improve cardiorespiratory fitness, muscular endurance and strength as measured by increased METs and functional capacity ( ) Short Term: Increase workloads from initial exercise prescription for resistance, speed, and METs.;Short Term: Perform resistance training exercises routinely during rehab and add in resistance training at home;Long Term: Improve cardiorespiratory fitness, muscular endurance and strength as measured by increased METs and functional capacity ( ) Short Term: Increase workloads from initial exercise prescription for resistance, speed, and METs.;Short Term: Perform resistance training exercises routinely during rehab and add in resistance training at home;Long Term: Improve cardiorespiratory fitness, muscular endurance and strength as measured by increased METs and  functional capacity ( )    Able to understand and use rate of perceived exertion (RPE) scale Yes Yes Yes Yes    Intervention Provide education and explanation on how to use RPE scale Provide education and explanation on how to use RPE scale Provide education and explanation on how to use RPE scale Provide education and explanation on how to use RPE scale    Expected Outcomes Short Term: Able to use RPE daily in rehab to express subjective intensity level;Long Term:  Able to use RPE to guide intensity level when exercising independently Short Term: Able to use RPE daily in rehab to express subjective intensity level;Long Term:  Able to use RPE to guide intensity level when exercising independently Short Term: Able to use RPE daily in rehab to express subjective intensity level;Long Term:  Able to use RPE to guide intensity level when exercising independently Short Term: Able to use RPE daily in rehab to express subjective intensity level;Long Term:  Able to use RPE to guide intensity level when exercising independently    Able to understand and use Dyspnea scale Yes Yes Yes Yes    Intervention Provide education and explanation on how to use Dyspnea scale Provide education and explanation on how to use Dyspnea scale Provide education and explanation on how to use Dyspnea scale Provide education and explanation on how to use Dyspnea scale    Expected Outcomes Short Term: Able to use Dyspnea scale daily in rehab to express subjective sense of shortness of breath during exertion;Long Term: Able to use Dyspnea scale to guide intensity level when exercising independently Short Term: Able to use Dyspnea scale daily in rehab to express subjective sense of shortness of breath during exertion;Long Term: Able to use Dyspnea scale to guide intensity level when exercising independently Short Term: Able to use Dyspnea scale daily in rehab to express subjective sense of shortness of breath during exertion;Long Term: Able  to use Dyspnea scale to guide intensity level when exercising independently Short Term: Able to use Dyspnea scale daily in rehab to express subjective sense of shortness of breath during exertion;Long Term: Able to use Dyspnea scale to guide intensity level when exercising independently    Knowledge and understanding of Target Heart Rate Range (THRR) Yes Yes Yes Yes    Intervention Provide education and explanation of THRR including how the numbers were predicted and where they are located for reference Provide education and explanation of THRR including how the numbers were predicted and where they are located for reference Provide education and explanation of THRR including how the numbers were predicted and where they are located for reference Provide education and explanation of THRR including how the numbers were predicted and where they are located for reference    Expected Outcomes Short Term: Able to state/look up THRR;Short Term: Able to use daily as guideline for intensity in rehab;Long Term: Able to use THRR to govern intensity when exercising independently Short Term: Able to state/look up THRR;Short Term: Able to use daily as guideline for intensity in rehab;Long Term: Able to use THRR to govern intensity when exercising independently Short Term: Able to state/look up THRR;Short Term: Able to use daily as guideline for intensity in rehab;Long Term: Able to use THRR to govern intensity when exercising independently Short Term: Able to state/look up THRR;Short Term: Able to use daily as guideline for intensity in rehab;Long Term: Able to use THRR to govern intensity when exercising independently    Understanding of Exercise Prescription Yes Yes Yes Yes    Intervention Provide education, explanation, and written materials on patient's individual exercise prescription Provide education, explanation, and written materials on patient's individual exercise prescription Provide education, explanation, and  written materials on patient's individual exercise prescription Provide education, explanation, and written materials on patient's individual exercise prescription    Expected Outcomes Short Term: Able to explain program exercise prescription;Long Term: Able to explain home exercise prescription to exercise independently Short Term: Able to explain program exercise prescription;Long Term: Able to explain home exercise prescription to exercise independently Short Term: Able to explain program exercise prescription;Long Term: Able to explain home exercise prescription to exercise independently Short Term: Able  to explain program exercise prescription;Long Term: Able to explain home exercise prescription to exercise independently             Exercise Goals Re-Evaluation :  Exercise Goals Re-Evaluation     Row Name 11/28/22 0918 12/18/22 1524 01/23/23 0923         Exercise Goal Re-Evaluation   Exercise Goals Review Increase Physical Activity;Able to understand and use Dyspnea scale;Understanding of Exercise Prescription;Increase Strength and Stamina;Knowledge and understanding of Target Heart Rate Range (THRR);Able to understand and use rate of perceived exertion (RPE) scale Increase Physical Activity;Able to understand and use Dyspnea scale;Understanding of Exercise Prescription;Increase Strength and Stamina;Knowledge and understanding of Target Heart Rate Range (THRR);Able to understand and use rate of perceived exertion (RPE) scale Increase Physical Activity;Able to understand and use Dyspnea scale;Understanding of Exercise Prescription;Increase Strength and Stamina;Knowledge and understanding of Target Heart Rate Range (THRR);Able to understand and use rate of perceived exertion (RPE) scale     Comments Pt will begin exercise on 3/14. Will monitor for progression. Pt has completed 4 exercise sessions with good attendance. She is exercising on the nustep recumbent stepper for 30 min, METs 1.8. She  recently increased to level 2. Plan to move pt to recumbent bike for the second station this week. Will monitor for progression. Pt has completed 9 exercise sessions. She had a fall with injury therefore missed multiple sessions. She is exercising on the nustep recumbent stepper for 15 min, level 3, METs 2.1. She now is exercising on the recumbent bike for 15 min, level 2, METs 1.6. She performs warm up and cool down independently. She is slowly progressing. Will monitor for progression.     Expected Outcomes Through exercise at rehab and home the patient will decrease shortness of breath with daily activities and feel confident in carrying out an exercise regimn at home Through exercise at rehab and home the patient will decrease shortness of breath with daily activities and feel confident in carrying out an exercise regimn at home Through exercise at rehab and home the patient will decrease shortness of breath with daily activities and feel confident in carrying out an exercise regimn at home              Discharge Exercise Prescription (Final Exercise Prescription Changes):  Exercise Prescription Changes - 01/23/23 1507       Response to Exercise   Blood Pressure (Admit) 94/50    Blood Pressure (Exit) 110/58    Heart Rate (Admit) 60 bpm    Heart Rate (Exercise) 77 bpm    Heart Rate (Exit) 61 bpm    Oxygen Saturation (Admit) 94 %    Oxygen Saturation (Exercise) 91 %    Oxygen Saturation (Exit) 90 %    Rating of Perceived Exertion (Exercise) 15    Perceived Dyspnea (Exercise) 1    Duration Continue with 30 min of aerobic exercise without signs/symptoms of physical distress.    Intensity THRR unchanged      Progression   Progression Continue to progress workloads to maintain intensity without signs/symptoms of physical distress.      Resistance Training   Training Prescription Yes    Weight red bands    Reps 10-15    Time 10 Minutes      Recumbant Bike   Level 2    Minutes 15     METs 2      NuStep   Level 2    SPM 60    Minutes 15  METs 2.1             Nutrition:  Target Goals: Understanding of nutrition guidelines, daily intake of sodium 1500mg , cholesterol 200mg , calories 30% from fat and 7% or less from saturated fats, daily to have 5 or more servings of fruits and vegetables.  Biometrics:    Nutrition Therapy Plan and Nutrition Goals:  Nutrition Therapy & Goals - 01/23/23 1532       Nutrition Therapy   Diet Heart Healthy Diet    Drug/Food Interactions Statins/Certain Fruits      Personal Nutrition Goals   Nutrition Goal Patient to improve diet quality by using the plate method as a guide for meal planning to include lean protein/plant protein, fruits, vegetables, whole grains, nonfat dairy as part of a well-balanced diet.    Personal Goal #2 Patient to limit sodium to 1500mg  per day    Comments Goals in action. Kathy [name] reports reading food labels for sodium, eating fish weekly, and choosing whole grains regularly. She reports lengthy orthopedic history, osteoarthritis, and fibromyalgia that impacts her pain and physical activity/mobility. Her son, who lives with her, was recently diagnosed with cancer. Kathy [name] will benefit from participation in pulmonary rehab for nutrition, exercise, and lifestyle modification.      Intervention Plan   Intervention Prescribe, educate and counsel regarding individualized specific dietary modifications aiming towards targeted core components such as weight, hypertension, lipid management, diabetes, heart failure and other comorbidities.;Nutrition handout(s) given to patient.    Expected Outcomes Short Term Goal: Understand basic principles of dietary content, such as calories, fat, sodium, cholesterol and nutrients.;Long Term Goal: Adherence to prescribed nutrition plan.             Nutrition Assessments:  Nutrition Assessments - 12/01/22 1131       Rate Your Plate Scores   Pre Score 71             MEDIFICTS Score Key: ?70 Need to make dietary changes  40-70 Heart Healthy Diet ? 40 Therapeutic Level Cholesterol Diet  Flowsheet Row PULMONARY REHAB OTHER RESPIRATORY from 11/30/2022 in [facility], Vascular, & [facility]  Picture Your Plate Total Score on Admission 71      Picture Your Plate Scores: <16 Unhealthy dietary pattern with much room for improvement. 41-50 Dietary pattern unlikely to meet recommendations for good health and room for improvement. 51-60 More healthful dietary pattern, with some room for improvement.  >60 Healthy dietary pattern, although there may be some specific behaviors that could be improved.    Nutrition Goals Re-Evaluation:  Nutrition Goals Re-Evaluation     Row Name 11/30/22 1556 12/26/22 1446 01/23/23 1532         Goals   Current Weight 292 lb 8.8 oz (132.7 kg) -- 294 lb 1.5 oz (133.4 kg)     Comment GFR 43, Cr 1.32, A1c 5.8, lipids WNL, B12 low/normal no new labs at this time; most recent labs GFR 43, Cr 1.32, A1c 5.8, lipids WNL, B12 low/normal no new labs at this time; most recent labs GFR 43, Cr 1.32, A1c 5.8, lipids WNL, B12 low/normal     Expected Outcome Goals in action. Kathy [name] reports reading food labels for sodium, eating fish weekly, and choosing whole grains regularly. She reports lengthy orthopedic history, osteoarthritis, and fibromyalgia that impacts her pain and physical activity/mobility. Kathy [name] will benefit from participation in pulmonary rehab for nutrition, exercise, and lifestyle modification. Goals in action. Kathy [name] reports reading food labels for sodium,  eating fish weekly, and choosing whole grains regularly. She reports lengthy orthopedic history, osteoarthritis, and fibromyalgia that impacts her pain and physical activity/mobility. Kathy [name] will benefit from participation in pulmonary rehab for nutrition, exercise, and lifestyle modification. Goals in action. Kathy [name] reports reading food labels for sodium,  eating fish weekly, and choosing whole grains regularly. She is down 3.5# since starting with our program. She reports lengthy orthopedic history, osteoarthritis, and fibromyalgia that impacts her pain and physical activity/mobility. Her son, who lives with her, was recently diagnosed with cancer. Kathy [name] will benefit from participation in pulmonary rehab for nutrition, exercise, and lifestyle modification.              Nutrition Goals Discharge (Final Nutrition Goals Re-Evaluation):  Nutrition Goals Re-Evaluation - 01/23/23 1532       Goals   Current Weight 294 lb 1.5 oz (133.4 kg)    Comment no new labs at this time; most recent labs GFR 43, Cr 1.32, A1c 5.8, lipids WNL, B12 low/normal    Expected Outcome Goals in action. Kathy [name] reports reading food labels for sodium, eating fish weekly, and choosing whole grains regularly. She is down 3.5# since starting with our program. She reports lengthy orthopedic history, osteoarthritis, and fibromyalgia that impacts her pain and physical activity/mobility. Her son, who lives with her, was recently diagnosed with cancer. Kathy [name] will benefit from participation in pulmonary rehab for nutrition, exercise, and lifestyle modification.             Psychosocial: Target Goals: Acknowledge presence or absence of significant depression and/or stress, maximize coping skills, provide positive support system. Participant is able to verbalize types and ability to use techniques and skills needed for reducing stress and depression.  Initial Review & Psychosocial Screening:  Initial Psych Review & Screening - 11/24/22 1358       Initial Review   Current issues with Current Anxiety/Panic;Current Psychotropic Meds      Family Dynamics   Good Support System? Yes    Comments Sons      Screening Interventions   Interventions Encouraged to exercise             Quality of Life Scores:  Scores of 19 and below usually indicate a poorer quality of life in these  areas.  A difference of  2-3 points is a clinically meaningful difference.  A difference of 2-3 points in the total score of the Quality of Life Index has been associated with significant improvement in overall quality of life, self-image, physical symptoms, and general health in studies assessing change in quality of life.  PHQ-9: Review Flowsheet  More data exists      12/28/2022 11/24/2022 06/06/2022 05/09/2022 10/12/2021  Depression screen PHQ 2/9  Decreased Interest 0 0 0 1 1  Down, Depressed, Hopeless 0 0 0 0 1  PHQ - 2 Score 0 0 0 1 2  Altered sleeping 0 0 0 3 2  Tired, decreased energy 0 0 0 1 1  Change in appetite 0 0 0 3 3  Feeling bad or failure about yourself  0 0 0 0 0  Trouble concentrating 0 0 0 0 0  Moving slowly or fidgety/restless 0 0 0 0 0  Suicidal thoughts 0 0 0 0 0  PHQ-9 Score 0 0 0 8 8  Difficult doing work/chores Not difficult at all - Not difficult at all Not difficult at all Not difficult at all   Interpretation of Total Score  Total Score Depression Severity:  1-4 =  Minimal depression, 5-9 = Mild depression, 10-14 = Moderate depression, 15-19 = Moderately severe depression, 20-27 = Severe depression   Psychosocial Evaluation and Intervention:  Psychosocial Evaluation - 11/24/22 1359       Psychosocial Evaluation & Interventions   Interventions Encouraged to exercise with the program and follow exercise prescription    Comments Kathy [name] currently denies any psychosocial barriers or concerns.    Expected Outcomes For Kathy [name] to continue PR without any psychosocial barriers or concerns.    Continue Psychosocial Services  No Follow up required             Psychosocial Re-Evaluation:  Psychosocial Re-Evaluation     Row Name 11/24/22 1510 12/15/22 0840 01/22/23 0957         Psychosocial Re-Evaluation   Current issues with Current Anxiety/Panic;Current Psychotropic Meds -- Current Anxiety/Panic     Comments No change in psychosocial assessment since orientation  11/24/2022. No concerns identified Kathy [name] denies any psychosocial barriers or concerns at this time. Kathy [name] expresses new stress and anxiety over her adult son currently living with her. She states he comes in and out of the house all hours of the night with no regard for her. She is also worried about his recent health issues. Kathy [name] has a strong faith and has put it in [employer]. She is currently on psychotropic meds and denies a need to see a therapist.     Expected Outcomes To attend PR without any psychosocial barriers or concerns To attend PR without any psychosocial barriers or concerns To attend PR without any psychosocial barriers or concerns     Interventions Relaxation education;Encouraged to attend Pulmonary Rehabilitation for the exercise Encouraged to attend Pulmonary Rehabilitation for the exercise Encouraged to attend Pulmonary Rehabilitation for the exercise     Continue Psychosocial Services  No Follow up required No Follow up required Follow up required by staff              Psychosocial Discharge (Final Psychosocial Re-Evaluation):  Psychosocial Re-Evaluation - 01/22/23 0957       Psychosocial Re-Evaluation   Current issues with Current Anxiety/Panic    Comments Kathy [name] expresses new stress and anxiety over her adult son currently living with her. She states he comes in and out of the house all hours of the night with no regard for her. She is also worried about his recent health issues. Kathy [name] has a strong faith and has put it in [employer]. She is currently on psychotropic meds and denies a need to see a therapist.    Expected Outcomes To attend PR without any psychosocial barriers or concerns    Interventions Encouraged to attend Pulmonary Rehabilitation for the exercise    Continue Psychosocial Services  Follow up required by staff             Education: Education Goals: Education classes will be provided on a weekly basis, covering required topics. Participant will state  understanding/return demonstration of topics presented.  Learning Barriers/Preferences:   Education Topics: Introduction to Pulmonary Rehab Group instruction provided by PowerPoint, verbal discussion, and written material to support subject matter. Instructor reviews what Pulmonary Rehab is, the purpose of the program, and how patients are referred.     Know Your Numbers Group instruction that is supported by a PowerPoint presentation. Instructor discusses importance of knowing and understanding resting, exercise, and post-exercise oxygen saturation, heart rate, and blood pressure. Oxygen saturation, heart rate, blood pressure, rating of perceived exertion, and dyspnea are reviewed along  with a normal range for these values.    Exercise for the Pulmonary Patient Group instruction that is supported by a PowerPoint presentation. Instructor discusses benefits of exercise, core components of exercise, frequency, duration, and intensity of an exercise routine, importance of utilizing pulse oximetry during exercise, safety while exercising, and options of places to exercise outside of rehab.  Flowsheet Row PULMONARY REHAB OTHER RESPIRATORY from 12/21/2022 in [facility] for Heart, Vascular, & Lung Health  Date 12/21/22  Educator EP  Instruction Review Code 1- Verbalizes Understanding          MET Level  Group instruction provided by PowerPoint, verbal discussion, and written material to support subject matter. Instructor reviews what METs are and how to increase METs.    Pulmonary Medications Verbally interactive group education provided by instructor with focus on inhaled medications and proper administration. Flowsheet Row PULMONARY REHAB OTHER RESPIRATORY from 12/14/2022 in [facility] for Heart, Vascular, & Lung Health  Date 12/14/22  Educator RT  Instruction Review Code 1- Verbalizes Understanding       Anatomy and Physiology of the  Respiratory System Group instruction provided by PowerPoint, verbal discussion, and written material to support subject matter. Instructor reviews respiratory cycle and anatomical components of the respiratory system and their functions. Instructor also reviews differences in obstructive and restrictive respiratory diseases with examples of each.    Oxygen Safety Group instruction provided by PowerPoint, verbal discussion, and written material to support subject matter. There is an overview of "What is Oxygen" and "Why do we need it".  Instructor also reviews how to create a safe environment for oxygen use, the importance of using oxygen as prescribed, and the risks of noncompliance. There is a brief discussion on traveling with oxygen and resources the patient may utilize. Flowsheet Row PULMONARY REHAB OTHER RESPIRATORY from 01/04/2023 in [facility] for Heart, Vascular, & Lung Health  Date 01/04/23  Educator RN  Instruction Review Code 1- Verbalizes Understanding       Oxygen Use Group instruction provided by PowerPoint, verbal discussion, and written material to discuss how supplemental oxygen is prescribed and different types of oxygen supply systems. Resources for more information are provided.  Flowsheet Row PULMONARY REHAB OTHER RESPIRATORY from 01/11/2023 in [facility] for Heart, Vascular, & Lung Health  Date 01/11/23  Educator RT  Instruction Review Code 1- Verbalizes Understanding       Breathing Techniques Group instruction that is supported by demonstration and informational handouts. Instructor discusses the benefits of pursed lip and diaphragmatic breathing and detailed demonstration on how to perform both.     Risk Factor Reduction Group instruction that is supported by a PowerPoint presentation. Instructor discusses the definition of a risk factor, different risk factors for pulmonary disease, and how the heart and lungs work  together.   MD Day A group question and answer session with a medical doctor that allows participants to ask questions that relate to their pulmonary disease state.   Nutrition for the Pulmonary Patient Group instruction provided by PowerPoint slides, verbal discussion, and written materials to support subject matter. The instructor gives an explanation and review of healthy diet recommendations, which includes a discussion on weight management, recommendations for fruit and vegetable consumption, as well as protein, fluid, caffeine, fiber, sodium, sugar, and alcohol. Tips for eating when patients are short of breath are discussed.    Other Education Group or individual verbal, written, or video instructions  that support the educational goals of the pulmonary rehab program.    Knowledge Questionnaire Score:  Knowledge Questionnaire Score - 11/24/22 1416       Knowledge Questionnaire Score   Pre Score 14/18             Core Components/Risk Factors/Patient Goals at Admission:  Personal Goals and Risk Factors at Admission - 11/24/22 1359       Core Components/Risk Factors/Patient Goals on Admission    Weight Management Weight Loss    Improve shortness of breath with ADL's Yes    Intervention Provide education, individualized exercise plan and daily activity instruction to help decrease symptoms of SOB with activities of daily living.    Expected Outcomes Short Term: Improve cardiorespiratory fitness to achieve a reduction of symptoms when performing ADLs;Long Term: Be able to perform more ADLs without symptoms or delay the onset of symptoms             Core Components/Risk Factors/Patient Goals Review:   Goals and Risk Factor Review     Row Name 11/24/22 1512 12/15/22 0843 01/22/23 1024         Core Components/Risk Factors/Patient Goals Review   Personal Goals Review Weight Management/Obesity;Heart Failure;Develop more efficient breathing techniques such as purse  lipped breathing and diaphragmatic breathing and practicing self-pacing with activity. Weight Management/Obesity;Develop more efficient breathing techniques such as purse lipped breathing and diaphragmatic breathing and practicing self-pacing with activity.;Increase knowledge of respiratory medications and ability to use respiratory devices properly.;Improve shortness of breath with ADL's;Heart Failure Weight Management/Obesity;Develop more efficient breathing techniques such as purse lipped breathing and diaphragmatic breathing and practicing self-pacing with activity.;Improve shortness of breath with ADL's;Increase knowledge of respiratory medications and ability to use respiratory devices properly.     Review Kathy [name] has not yet started the PR program. She is scheduled to start next Thursday 11/30/22 Kathy [name] has attended 4 sessions so far. She is currently exercising on the Nustep for 30 minutes. She has been able to increase her level on the Nustep. She is demonstrating purse lip breathing when getting short of breath. Kathy [name] is able to report her rate of perceived exertion and dyspnea scores when asked by staff. Kathy [name] is also working with the staff dietician to work on her weight loss goals and ways to cut down on sodium to better control her heart failure. Kathy [name] has attended class on pulmonary medications. Kathy [name] enjoys coming to class and feels like it is helping her build her endurance. Kathy [name] has attended 9 sessions so far. She is currently exercising on the Nustep and the Recumbent bike. She has been able to increase her level and MET's on the Nustep. She has also increased her level and MET's on the recumbent bike.  She is demonstrating purse lip breathing when getting short of breath. Kathy [name] is able to report her rate of perceived exertion and dyspnea scores when asked by staff. Kathy [name] has not met her weight loss goals yet, but is still working with the staff dietician to work on her weight loss goals. Kathy [name] has attended class on  pulmonary medications, exercise for the pulmonary patient, oxygen safety and oxygen use. Kathy [name] enjoys coming to class and feels like it is helping her build her endurance.     Expected Outcomes See admission goals See admission goals See admission goals              Core Components/Risk Factors/Patient Goals at Discharge (Final Review):   Goals and Risk Factor Review - 01/22/23  1024       Core Components/Risk Factors/Patient Goals Review   Personal Goals Review Weight Management/Obesity;Develop more efficient breathing techniques such as purse lipped breathing and diaphragmatic breathing and practicing self-pacing with activity.;Improve shortness of breath with ADL's;Increase knowledge of respiratory medications and ability to use respiratory devices properly.    Review Kathy [name] has attended 9 sessions so far. She is currently exercising on the Nustep and the Recumbent bike. She has been able to increase her level and MET's on the Nustep. She has also increased her level and MET's on the recumbent bike.  She is demonstrating purse lip breathing when getting short of breath. Kathy [name] is able to report her rate of perceived exertion and dyspnea scores when asked by staff. Kathy [name] has not met her weight loss goals yet, but is still working with the staff dietician to work on her weight loss goals. Kathy [name] has attended class on pulmonary medications, exercise for the pulmonary patient, oxygen safety and oxygen use. Kathy [name] enjoys coming to class and feels like it is helping her build her endurance.    Expected Outcomes See admission goals             ITP Comments:Pt is making expected progress toward Pulmonary Rehab goals after completing 10 sessions. Recommend continued exercise, life style modification, education, and utilization of breathing techniques to increase stamina and strength, while also decreasing shortness of breath with exertion.  Dr. Mechele Collin is Medical Director for Pulmonary Rehab at [facility].     Comments: Dr. Mechele Collin is Medical Director for Pulmonary Rehab at [facility].

## 2023-02-01 ENCOUNTER — Encounter (HOSPITAL_COMMUNITY): Payer: Medicare Other

## 2023-02-01 ENCOUNTER — Telehealth (HOSPITAL_COMMUNITY): Payer: Self-pay

## 2023-02-01 NOTE — Telephone Encounter (Signed)
Called Kathy [name] to check on her. Kathy [name] discussed her son's diagnosis. Mentioned her graduation date as she understands.

## 2023-02-02 ENCOUNTER — Other Ambulatory Visit: Payer: Self-pay | Admitting: Internal Medicine

## 2023-02-02 DIAGNOSIS — E559 Vitamin D deficiency, unspecified: Secondary | ICD-10-CM

## 2023-02-06 ENCOUNTER — Encounter (HOSPITAL_COMMUNITY): Payer: Medicare Other

## 2023-02-06 ENCOUNTER — Telehealth (HOSPITAL_COMMUNITY): Payer: Self-pay | Admitting: *Deleted

## 2023-02-06 NOTE — Telephone Encounter (Signed)
Kathy [name] called and asked to be discharged from [facility] due to the need to care for her son who has been recently diagnosed with cancer. She would like to do the program again in the future. Will cancel her appointments. [name] BS, ACSM-CEP 02/06/2023 4:27 PM

## 2023-02-07 NOTE — Progress Notes (Signed)
Discharge Progress Report  Patient Details  Name: Kathy [name] MRN: [id number] Date of Birth: August 06, 1951 Referring Provider:   Doristine Devoid Pulmonary Rehab Walk Test from 11/24/2022 in [facility]  Referring Provider Sabharwal        Number of Visits: 10  Reason for Discharge:  Early Exit:  Personal and Lack of attendance  Smoking History:  Social History   Tobacco Use  Smoking Status Never  Smokeless Tobacco Never    Diagnosis:  Chronic diastolic CHF (congestive heart failure) (HCC)  ADL [facility]:  Pulmonary Assessment Scores     Row Name 11/24/22 1410         ADL [facility]   SOB Score total 40       CAT Score   CAT Score 12       mMRC Score   mMRC Score 3              Initial Exercise Prescription:  Initial Exercise Prescription - 11/24/22 1500       Date of Initial Exercise RX and Referring Provider   Date 11/24/22    Referring Provider Sabharwal    Expected Discharge Date 02/20/23      NuStep   Level 1    SPM 50    Minutes 30      Prescription Details   Frequency (times per week) 2    Duration Progress to 30 minutes of continuous aerobic without signs/symptoms of physical distress      Intensity   THRR 40-80% of Max Heartrate 60-120    Ratings of Perceived Exertion 11-13    Perceived Dyspnea 0-4      Progression   Progression Continue progressive overload as per policy without signs/symptoms or physical distress.      Resistance Training   Training Prescription Yes    Weight red bands    Reps 10-15             Discharge Exercise Prescription (Final Exercise Prescription Changes):  Exercise Prescription Changes - 01/23/23 1507       Response to Exercise   Blood Pressure (Admit) 94/50    Blood Pressure (Exit) 110/58    Heart Rate (Admit) 60 bpm    Heart Rate (Exercise) 77 bpm    Heart Rate (Exit) 61 bpm    Oxygen Saturation (Admit) 94 %    Oxygen Saturation  (Exercise) 91 %    Oxygen Saturation (Exit) 90 %    Rating of Perceived Exertion (Exercise) 15    Perceived Dyspnea (Exercise) 1    Duration Continue with 30 min of aerobic exercise without signs/symptoms of physical distress.    Intensity THRR unchanged      Progression   Progression Continue to progress workloads to maintain intensity without signs/symptoms of physical distress.      Resistance Training   Training Prescription Yes    Weight red bands    Reps 10-15    Time 10 Minutes      Recumbant Bike   Level 2    Minutes 15    METs 2      NuStep   Level 2    SPM 60    Minutes 15    METs 2.1             Functional Capacity:  6 Minute Walk     Row Name 11/24/22 1503         6 Minute Walk  Phase Initial     Distance 600 feet     Walk Time 6 minutes     # of Rest Breaks 1     MPH 1.14     METS 0.84     RPE 12     Perceived Dyspnea  2     VO2 Peak 2.94     Resting HR 64 bpm     Resting BP 136/72     Resting Oxygen Saturation  95 %     Exercise Oxygen Saturation  during 6 min walk 90 %     Max Ex. HR 91 bpm     Max Ex. BP 193/77     2 Minute Post BP 149/79       Interval HR   1 Minute HR 78     2 Minute HR 84     3 Minute HR 88     4 Minute HR 88     5 Minute HR 87     6 Minute HR 91     2 Minute Post HR 69     Interval Heart Rate? Yes              Psychological, QOL, Others - Outcomes: PHQ 2/9:    12/28/2022   10:09 AM 11/24/2022    2:11 PM 06/06/2022    2:27 PM 05/09/2022    3:43 PM 10/12/2021    8:40 AM  Depression screen PHQ 2/9  Decreased Interest 0 0 0 1 1  Down, Depressed, Hopeless 0 0 0 0 1  PHQ - 2 Score 0 0 0 1 2  Altered sleeping 0 0 0 3 2  Tired, decreased energy 0 0 0 1 1  Change in appetite 0 0 0 3 3  Feeling bad or failure about yourself  0 0 0 0 0  Trouble concentrating 0 0 0 0 0  Moving slowly or fidgety/restless 0 0 0 0 0  Suicidal thoughts 0 0 0 0 0  PHQ-9 Score 0 0 0 8 8  Difficult doing work/chores Not  difficult at all  Not difficult at all Not difficult at all Not difficult at all    Quality of Life:   Personal Goals: Goals established at orientation with interventions provided to work toward goal.  Personal Goals and Risk Factors at Admission - 11/24/22 1359       Core Components/Risk Factors/Patient Goals on Admission    Weight Management Weight Loss    Improve shortness of breath with ADL's Yes    Intervention Provide education, individualized exercise plan and daily activity instruction to help decrease symptoms of SOB with activities of daily living.    Expected Outcomes Short Term: Improve cardiorespiratory fitness to achieve a reduction of symptoms when performing ADLs;Long Term: Be able to perform more ADLs without symptoms or delay the onset of symptoms              Personal Goals Discharge:  Goals and Risk Factor Review     Row Name 11/24/22 1512 12/15/22 0843 01/22/23 1024         Core Components/Risk Factors/Patient Goals Review   Personal Goals Review Weight Management/Obesity;Heart Failure;Develop more efficient breathing techniques such as purse lipped breathing and diaphragmatic breathing and practicing self-pacing with activity. Weight Management/Obesity;Develop more efficient breathing techniques such as purse lipped breathing and diaphragmatic breathing and practicing self-pacing with activity.;Increase knowledge of respiratory medications and ability to use respiratory devices properly.;Improve shortness of breath with ADL's;Heart  Failure Weight Management/Obesity;Develop more efficient breathing techniques such as purse lipped breathing and diaphragmatic breathing and practicing self-pacing with activity.;Improve shortness of breath with ADL's;Increase knowledge of respiratory medications and ability to use respiratory devices properly.     Review Kathy [name] has not yet started the PR program. She is scheduled to start next Thursday 11/30/22 Kathy [name] has attended 4 sessions  so far. She is currently exercising on the Nustep for 30 minutes. She has been able to increase her level on the Nustep. She is demonstrating purse lip breathing when getting short of breath. Kathy [name] is able to report her rate of perceived exertion and dyspnea scores when asked by staff. Kathy [name] is also working with the staff dietician to work on her weight loss goals and ways to cut down on sodium to better control her heart failure. Kathy [name] has attended class on pulmonary medications. Kathy [name] enjoys coming to class and feels like it is helping her build her endurance. Kathy [name] has attended 9 sessions so far. She is currently exercising on the Nustep and the Recumbent bike. She has been able to increase her level and MET's on the Nustep. She has also increased her level and MET's on the recumbent bike.  She is demonstrating purse lip breathing when getting short of breath. Kathy [name] is able to report her rate of perceived exertion and dyspnea scores when asked by staff. Kathy [name] has not met her weight loss goals yet, but is still working with the staff dietician to work on her weight loss goals. Kathy [name] has attended class on pulmonary medications, exercise for the pulmonary patient, oxygen safety and oxygen use. Kathy [name] enjoys coming to class and feels like it is helping her build her endurance.     Expected Outcomes See admission goals See admission goals See admission goals              Exercise Goals and Review:  Exercise Goals     Row Name 11/24/22 1409 11/28/22 0918 12/18/22 1524 01/23/23 0922       Exercise Goals   Increase Physical Activity Yes Yes Yes Yes    Intervention Provide advice, education, support and counseling about physical activity/exercise needs.;Develop an individualized exercise prescription for aerobic and resistive training based on initial evaluation findings, risk stratification, comorbidities and participant's personal goals. Provide advice, education, support and counseling about physical activity/exercise  needs.;Develop an individualized exercise prescription for aerobic and resistive training based on initial evaluation findings, risk stratification, comorbidities and participant's personal goals. Provide advice, education, support and counseling about physical activity/exercise needs.;Develop an individualized exercise prescription for aerobic and resistive training based on initial evaluation findings, risk stratification, comorbidities and participant's personal goals. Provide advice, education, support and counseling about physical activity/exercise needs.;Develop an individualized exercise prescription for aerobic and resistive training based on initial evaluation findings, risk stratification, comorbidities and participant's personal goals.    Expected Outcomes Short Term: Attend rehab on a regular basis to increase amount of physical activity.;Long Term: Exercising regularly at least 3-5 days a week.;Long Term: Add in home exercise to make exercise part of routine and to increase amount of physical activity. Short Term: Attend rehab on a regular basis to increase amount of physical activity.;Long Term: Exercising regularly at least 3-5 days a week.;Long Term: Add in home exercise to make exercise part of routine and to increase amount of physical activity. Short Term: Attend rehab on a regular basis to increase amount of physical activity.;Long Term: Exercising regularly at least 3-5 days a week.;Long Term: Add in  home exercise to make exercise part of routine and to increase amount of physical activity. Short Term: Attend rehab on a regular basis to increase amount of physical activity.;Long Term: Exercising regularly at least 3-5 days a week.;Long Term: Add in home exercise to make exercise part of routine and to increase amount of physical activity.    Increase Strength and Stamina Yes Yes Yes Yes    Intervention Provide advice, education, support and counseling about physical activity/exercise  needs.;Develop an individualized exercise prescription for aerobic and resistive training based on initial evaluation findings, risk stratification, comorbidities and participant's personal goals. Provide advice, education, support and counseling about physical activity/exercise needs.;Develop an individualized exercise prescription for aerobic and resistive training based on initial evaluation findings, risk stratification, comorbidities and participant's personal goals. Provide advice, education, support and counseling about physical activity/exercise needs.;Develop an individualized exercise prescription for aerobic and resistive training based on initial evaluation findings, risk stratification, comorbidities and participant's personal goals. Provide advice, education, support and counseling about physical activity/exercise needs.;Develop an individualized exercise prescription for aerobic and resistive training based on initial evaluation findings, risk stratification, comorbidities and participant's personal goals.    Expected Outcomes Short Term: Increase workloads from initial exercise prescription for resistance, speed, and METs.;Short Term: Perform resistance training exercises routinely during rehab and add in resistance training at home;Long Term: Improve cardiorespiratory fitness, muscular endurance and strength as measured by increased METs and functional capacity ( ) Short Term: Increase workloads from initial exercise prescription for resistance, speed, and METs.;Short Term: Perform resistance training exercises routinely during rehab and add in resistance training at home;Long Term: Improve cardiorespiratory fitness, muscular endurance and strength as measured by increased METs and functional capacity ( ) Short Term: Increase workloads from initial exercise prescription for resistance, speed, and METs.;Short Term: Perform resistance training exercises routinely during rehab and add in  resistance training at home;Long Term: Improve cardiorespiratory fitness, muscular endurance and strength as measured by increased METs and functional capacity ( ) Short Term: Increase workloads from initial exercise prescription for resistance, speed, and METs.;Short Term: Perform resistance training exercises routinely during rehab and add in resistance training at home;Long Term: Improve cardiorespiratory fitness, muscular endurance and strength as measured by increased METs and functional capacity ( )    Able to understand and use rate of perceived exertion (RPE) scale Yes Yes Yes Yes    Intervention Provide education and explanation on how to use RPE scale Provide education and explanation on how to use RPE scale Provide education and explanation on how to use RPE scale Provide education and explanation on how to use RPE scale    Expected Outcomes Short Term: Able to use RPE daily in rehab to express subjective intensity level;Long Term:  Able to use RPE to guide intensity level when exercising independently Short Term: Able to use RPE daily in rehab to express subjective intensity level;Long Term:  Able to use RPE to guide intensity level when exercising independently Short Term: Able to use RPE daily in rehab to express subjective intensity level;Long Term:  Able to use RPE to guide intensity level when exercising independently Short Term: Able to use RPE daily in rehab to express subjective intensity level;Long Term:  Able to use RPE to guide intensity level when exercising independently    Able to understand and use Dyspnea scale Yes Yes Yes Yes    Intervention Provide education and explanation on how to use Dyspnea scale Provide education and explanation on how to use Dyspnea scale Provide education and explanation on  how to use Dyspnea scale Provide education and explanation on how to use Dyspnea scale    Expected Outcomes Short Term: Able to use Dyspnea scale daily in rehab to express  subjective sense of shortness of breath during exertion;Long Term: Able to use Dyspnea scale to guide intensity level when exercising independently Short Term: Able to use Dyspnea scale daily in rehab to express subjective sense of shortness of breath during exertion;Long Term: Able to use Dyspnea scale to guide intensity level when exercising independently Short Term: Able to use Dyspnea scale daily in rehab to express subjective sense of shortness of breath during exertion;Long Term: Able to use Dyspnea scale to guide intensity level when exercising independently Short Term: Able to use Dyspnea scale daily in rehab to express subjective sense of shortness of breath during exertion;Long Term: Able to use Dyspnea scale to guide intensity level when exercising independently    Knowledge and understanding of Target Heart Rate Range (THRR) Yes Yes Yes Yes    Intervention Provide education and explanation of THRR including how the numbers were predicted and where they are located for reference Provide education and explanation of THRR including how the numbers were predicted and where they are located for reference Provide education and explanation of THRR including how the numbers were predicted and where they are located for reference Provide education and explanation of THRR including how the numbers were predicted and where they are located for reference    Expected Outcomes Short Term: Able to state/look up THRR;Short Term: Able to use daily as guideline for intensity in rehab;Long Term: Able to use THRR to govern intensity when exercising independently Short Term: Able to state/look up THRR;Short Term: Able to use daily as guideline for intensity in rehab;Long Term: Able to use THRR to govern intensity when exercising independently Short Term: Able to state/look up THRR;Short Term: Able to use daily as guideline for intensity in rehab;Long Term: Able to use THRR to govern intensity when exercising independently  Short Term: Able to state/look up THRR;Short Term: Able to use daily as guideline for intensity in rehab;Long Term: Able to use THRR to govern intensity when exercising independently    Understanding of Exercise Prescription Yes Yes Yes Yes    Intervention Provide education, explanation, and written materials on patient's individual exercise prescription Provide education, explanation, and written materials on patient's individual exercise prescription Provide education, explanation, and written materials on patient's individual exercise prescription Provide education, explanation, and written materials on patient's individual exercise prescription    Expected Outcomes Short Term: Able to explain program exercise prescription;Long Term: Able to explain home exercise prescription to exercise independently Short Term: Able to explain program exercise prescription;Long Term: Able to explain home exercise prescription to exercise independently Short Term: Able to explain program exercise prescription;Long Term: Able to explain home exercise prescription to exercise independently Short Term: Able to explain program exercise prescription;Long Term: Able to explain home exercise prescription to exercise independently             Exercise Goals Re-Evaluation:  Exercise Goals Re-Evaluation     Row Name 11/28/22 0918 12/18/22 1524 01/23/23 0923         Exercise Goal Re-Evaluation   Exercise Goals Review Increase Physical Activity;Able to understand and use Dyspnea scale;Understanding of Exercise Prescription;Increase Strength and Stamina;Knowledge and understanding of Target Heart Rate Range (THRR);Able to understand and use rate of perceived exertion (RPE) scale Increase Physical Activity;Able to understand and use Dyspnea scale;Understanding of Exercise Prescription;Increase Strength  and Stamina;Knowledge and understanding of Target Heart Rate Range (THRR);Able to understand and use rate of perceived  exertion (RPE) scale Increase Physical Activity;Able to understand and use Dyspnea scale;Understanding of Exercise Prescription;Increase Strength and Stamina;Knowledge and understanding of Target Heart Rate Range (THRR);Able to understand and use rate of perceived exertion (RPE) scale     Comments Pt will begin exercise on 3/14. Will monitor for progression. Pt has completed 4 exercise sessions with good attendance. She is exercising on the nustep recumbent stepper for 30 min, METs 1.8. She recently increased to level 2. Plan to move pt to recumbent bike for the second station this week. Will monitor for progression. Pt has completed 9 exercise sessions. She had a fall with injury therefore missed multiple sessions. She is exercising on the nustep recumbent stepper for 15 min, level 3, METs 2.1. She now is exercising on the recumbent bike for 15 min, level 2, METs 1.6. She performs warm up and cool down independently. She is slowly progressing. Will monitor for progression.     Expected Outcomes Through exercise at rehab and home the patient will decrease shortness of breath with daily activities and feel confident in carrying out an exercise regimn at home Through exercise at rehab and home the patient will decrease shortness of breath with daily activities and feel confident in carrying out an exercise regimn at home Through exercise at rehab and home the patient will decrease shortness of breath with daily activities and feel confident in carrying out an exercise regimn at home              Nutrition & Weight - Outcomes:    Nutrition:  Nutrition Therapy & Goals - 01/23/23 1532       Nutrition Therapy   Diet Heart Healthy Diet    Drug/Food Interactions Statins/Certain Fruits      Personal Nutrition Goals   Nutrition Goal Patient to improve diet quality by using the plate method as a guide for meal planning to include lean protein/plant protein, fruits, vegetables, whole grains, nonfat dairy  as part of a well-balanced diet.    Personal Goal #2 Patient to limit sodium to 1500mg  per day    Comments Goals in action. Kathy [name] reports reading food labels for sodium, eating fish weekly, and choosing whole grains regularly. She reports lengthy orthopedic history, osteoarthritis, and fibromyalgia that impacts her pain and physical activity/mobility. Her son, who lives with her, was recently diagnosed with cancer. Kathy [name] will benefit from participation in pulmonary rehab for nutrition, exercise, and lifestyle modification.      Intervention Plan   Intervention Prescribe, educate and counsel regarding individualized specific dietary modifications aiming towards targeted core components such as weight, hypertension, lipid management, diabetes, heart failure and other comorbidities.;Nutrition handout(s) given to patient.    Expected Outcomes Short Term Goal: Understand basic principles of dietary content, such as calories, fat, sodium, cholesterol and nutrients.;Long Term Goal: Adherence to prescribed nutrition plan.             Nutrition Discharge:  Nutrition Assessments - 12/01/22 1131       Rate Your Plate Scores   Pre Score 71             Education Questionnaire Score:  Knowledge Questionnaire Score - 11/24/22 1416       Knowledge Questionnaire Score   Pre Score 14/18             Goals reviewed with patient; copy given to patient.

## 2023-02-08 ENCOUNTER — Encounter (HOSPITAL_COMMUNITY): Payer: Medicare Other

## 2023-02-13 ENCOUNTER — Encounter (HOSPITAL_COMMUNITY): Payer: Medicare Other

## 2023-02-15 ENCOUNTER — Encounter (HOSPITAL_COMMUNITY): Payer: Medicare Other

## 2023-02-20 ENCOUNTER — Encounter (HOSPITAL_COMMUNITY): Payer: Medicare Other

## 2023-02-24 ENCOUNTER — Other Ambulatory Visit: Payer: Self-pay | Admitting: Cardiovascular Disease

## 2023-02-28 ENCOUNTER — Other Ambulatory Visit: Payer: Self-pay | Admitting: Internal Medicine

## 2023-02-28 DIAGNOSIS — F419 Anxiety disorder, unspecified: Secondary | ICD-10-CM

## 2023-03-07 ENCOUNTER — Other Ambulatory Visit: Payer: Self-pay | Admitting: Internal Medicine

## 2023-03-19 ENCOUNTER — Telehealth: Payer: Self-pay | Admitting: Cardiology

## 2023-03-19 NOTE — Telephone Encounter (Signed)
Pt states she needs to talk with Dr. Mayford Knife about getting a new CPAP machine because hers is not lasting her through the night. Please advise.

## 2023-03-27 ENCOUNTER — Telehealth (HOSPITAL_COMMUNITY): Payer: Self-pay | Admitting: Cardiology

## 2023-03-27 NOTE — Telephone Encounter (Signed)
Pt called to report increase in SOB Reports increase in HR  (can hear her heartbeat louder now than in the past) -reports increase in life stressors (son recently diagnosed with stage 4 lung cancer)   -vitals and weight stable -reports medication compliance  Pt is due for follow up Appt with DR Sabharwal scheduled 7/18 @ 10  Advised would forward to provider in the event changes are needed prior to appt

## 2023-03-28 ENCOUNTER — Encounter: Payer: Self-pay | Admitting: Internal Medicine

## 2023-03-28 ENCOUNTER — Ambulatory Visit: Payer: Medicare Other | Admitting: Internal Medicine

## 2023-03-28 VITALS — BP 120/80 | HR 84 | Temp 98.2°F | Wt 297.0 lb

## 2023-03-28 DIAGNOSIS — J302 Other seasonal allergic rhinitis: Secondary | ICD-10-CM | POA: Diagnosis not present

## 2023-03-28 DIAGNOSIS — F4329 Adjustment disorder with other symptoms: Secondary | ICD-10-CM | POA: Diagnosis not present

## 2023-03-28 NOTE — Progress Notes (Signed)
Established Patient Office Visit     CC/Reason for Visit: Discuss acute concerns  HPI: Kathy [name] is a 72 y.o. female who is coming in today for the above mentioned reasons.  Has been having issues with dry cough, nasal drip and congestion.  Her 69 year old son was recently diagnosed with metastatic cancer, she is understandably having a hard time with this.  She has a good support system.   Past Medical/Surgical History: Past Medical History:  Diagnosis Date   Adenomatous colon polyp    2012   Anxiety and depression    Arthritis    Osteoarthritis, Dr Despina Hick   Cancer [facility])    skin - left foot excised   Chronic diastolic CHF (congestive heart failure) (HCC)    DDD (degenerative disc disease), cervical    Deaf, right    DVT (deep venous thrombosis) (HCC)    RLE DVT 08/2004, post right knee arthroscopy   Fibromyalgia    Dr Azzie Roup, Rheu   GERD (gastroesophageal reflux disease)    H/O hiatal hernia    Headache(784.0)    PAST HX MILD MIGRAINES - resolved per patient 08/18/20   History of kidney stones    passed stones   HTN (hypertension)    IBS (irritable bowel syndrome)    Internal hemorrhoid    Lumbar radiculopathy    Obesity    OSA on CPAP    Moderate with AHI 20/hr with nocturnal hypoxemia on CPAP   PAF (paroxysmal atrial fibrillation) (HCC) 06/2018   Pulmonary hypertension (HCC)     Past Surgical History:  Procedure Laterality Date   ABDOMINAL HYSTERECTOMY     dysfunctional menses   APPENDECTOMY     BREAST EXCISIONAL BIOPSY Right    CARDIAC CATHETERIZATION  06/26/2018   CARDIOVERSION N/A 10/10/2022   Procedure: CARDIOVERSION;  Surgeon: Dorthula Nettles, DO;  Location: [facility];  Service: Cardiovascular;  Laterality: N/A;   CHOLECYSTECTOMY     COLONOSCOPY  05/27/2002, 08/08/11   2003 diverticulosis, hemorrhoids 2012 same + cecal polyp   EYE SURGERY     KNEE ARTHROSCOPY  2008   right   KYPHOPLASTY Bilateral 08/19/2020   Procedure:  KYPHOPLASTY LUMBAR ONE;  Surgeon: Bedelia Person, MD;  Location: [facility] OR;  Service: Neurosurgery;  Laterality: Bilateral;  posterior   LEFT HEART CATH AND CORONARY ANGIOGRAPHY N/A 06/26/2018   Procedure: LEFT HEART CATH AND CORONARY ANGIOGRAPHY;  Surgeon: Iran Ouch, MD;  Location: [facility] INVASIVE CV LAB;  Service: Cardiovascular;  Laterality: N/A;   MELANOMA EXCISION Left 07/2013   foot   RIGHT HEART CATH N/A 10/10/2022   Procedure: RIGHT HEART CATH;  Surgeon: Dorthula Nettles, DO;  Location: [facility] INVAS[facility] CV LAB;  Service: Cardiovascular;  Laterality: N/A;   TEE WITHOUT CARDIOVERSION N/A 10/10/2022   Procedure: TRANSESOPHAGEAL ECHOCARDIOGRAM (TEE);  Surgeon: Dorthula Nettles, DO;  Location: [facility];  Service: Cardiovascular;  Laterality: N/A;   TOTAL HIP ARTHROPLASTY  02/2010   left ; Dr Despina Hick   TOTAL HIP ARTHROPLASTY  03/18/2012   Procedure: TOTAL HIP ARTHROPLASTY;  Surgeon: Loanne Drilling, MD;  Location: [facility] ORS;  Service: Orthopedics;  Laterality: Right;   UPPER GASTROINTESTINAL ENDOSCOPY  04/21/2009, 08/08/11   2010 gastritis ;2012 small hiatal hernia. Dr Leone Payor   WRIST FRACTURE SURGERY Left     Social History:  reports that she has never smoked. She has never used smokeless tobacco. She reports current alcohol use. She reports that she does not use drugs.  Allergies:  Allergies  Allergen Reactions   Achromycin [Tetracycline Hcl] Shortness Of Breath    Dyspnea     Penicillins     Facial angioedema Did it involve swelling of the face/tongue/throat, SOB, or low BP? Yes Did it involve sudden or severe rash/hives, skin peeling, or any reaction on the inside of your mouth or nose? No Did you need to seek medical attention at a hospital or doctor's office? No When did it last happen?      30+ years If all above answers are "NO", may proceed with cephalosporin use.    Sulfamethoxazole-Trimethoprim Hives   Nickel Itching   Zanaflex [Tizanidine Hydrochloride]     Causes  more pain     Family History:  Family History  Problem Relation Age of Onset   Heart attack Mother 66   COPD Mother    Breast cancer Mother 17   Hypertension Father    Diabetes Father    Heart failure Father 58   Breast cancer Paternal Aunt        cns mets   COPD Maternal Grandmother    Heart disease Paternal Grandmother        died 54   Diabetes Paternal Grandmother    Stroke Paternal Grandfather        in late 54s   Heart attack Brother 63   Colon cancer Neg Hx      Current Outpatient Medications:    ALPRAZolam (XANAX) 1 MG tablet, TAKE 1 TABLET BY MOUTH THREE TIMES A DAY AS NEEDED FOR ANXIETY, Disp: 90 tablet, Rfl: 0   amiodarone (PACERONE) 200 MG tablet, Take 1 tablet (200 mg total) by mouth 2 (two) times daily. Please cancel all previous orders for current medication. Change in dosage or pill size., Disp: 180 tablet, Rfl: 3   atorvastatin (LIPITOR) 40 MG tablet, TAKE 1 TABLET BY MOUTH EVERY DAY, Disp: 90 tablet, Rfl: 3   Calcium Carb-Cholecalciferol (CALCIUM 600 + D PO), Take 1 tablet by mouth daily., Disp: , Rfl:    cholecalciferol (VITAMIN D3) 25 MCG (1000 UNIT) tablet, Take 1,000 Units by mouth once a week. For 12 weeks, Disp: , Rfl:    clindamycin (CLEOCIN T) 1 % lotion, Apply 1 application  topically 2 (two) times daily as needed (rosacea)., Disp: , Rfl:    clobetasol (TEMOVATE) 0.05 % external solution, Apply 1 application topically daily as needed (rosacea)., Disp: , Rfl:    cyclobenzaprine (FLEXERIL) 5 MG tablet, Take 5 mg by mouth at bedtime as needed for muscle spasms., Disp: , Rfl:    ELIQUIS 5 MG TABS tablet, TAKE 1 TABLET BY MOUTH TWICE A DAY, Disp: 180 tablet, Rfl: 3   fluocinonide (LIDEX) 0.05 % external solution, Apply 1 application topically daily as needed (rosacea)., Disp: , Rfl:    FLUoxetine (PROZAC) 40 MG capsule, TAKE 1 CAPSULE BY MOUTH EVERY DAY, Disp: 90 capsule, Rfl: 1   furosemide (LASIX) 40 MG tablet, Take 1 tablet (40 mg total) by mouth daily.,  Disp: 90 tablet, Rfl: 1   gabapentin (NEURONTIN) 300 MG capsule, Take 1 capsule (300 mg total) by mouth at bedtime., Disp: 90 capsule, Rfl: 1   HYDROcodone-acetaminophen (NORCO/VICODIN) 5-325 MG tablet, Take 1 tablet by mouth every 6 (six) hours as needed for moderate pain., Disp: 30 tablet, Rfl: 0   methocarbamol (ROBAXIN) 500 MG tablet, Take 500 mg by mouth every 8 (eight) hours as needed., Disp: , Rfl:    metroNIDAZOLE (METROCREAM) 0.75 % cream, Apply 1 application topically  2 (two) times daily as needed (rosacea). , Disp: , Rfl:    Multiple Vitamin (MULTIVITAMIN WITH MINERALS) TABS tablet, Take 1 tablet by mouth daily. Centrum Silver, Disp: , Rfl:    omeprazole (PRILOSEC) 40 MG capsule, TAKE 1 CAPSULE BY MOUTH EVERY DAY, Disp: 90 capsule, Rfl: 1   potassium chloride (KLOR-CON) 10 MEQ tablet, TAKE 1 TABLET (10 MEQ TOTAL) BY MOUTH DAILY. PLEASE KEEPS APPOINTMENT FOR FUTURE REFILLS. THANK YOU., Disp: 90 tablet, Rfl: 3   sacubitril-valsartan (ENTRESTO) 49-51 MG, Take 1 tablet by mouth 2 (two) times daily., Disp: 60 tablet, Rfl: 11   nitroGLYCERIN (NITROSTAT) 0.4 MG SL tablet, Place 1 tablet (0.4 mg total) under the tongue every 5 (five) minutes as needed for chest pain., Disp: 90 tablet, Rfl: 3  Review of Systems:  Negative unless indicated in HPI.   Physical Exam: Vitals:   03/28/23 1438  BP: 120/80  Pulse: 84  Temp: 98.2 F (36.8 C)  TempSrc: Oral  SpO2: 95%  Weight: 297 lb (134.7 kg)    Body mass index is 47.94 kg/m.   Physical Exam Vitals reviewed.  Constitutional:      Appearance: Normal appearance.  HENT:     Head: Normocephalic and atraumatic.  Eyes:     Conjunctiva/sclera: Conjunctivae normal.     Pupils: Pupils are equal, round, and reactive to light.  Cardiovascular:     Rate and Rhythm: Normal rate and regular rhythm.  Pulmonary:     Effort: Pulmonary effort is normal.     Breath sounds: Normal breath sounds.  Skin:    General: Skin is warm and dry.   Neurological:     General: No focal deficit present.     Mental Status: She is alert and oriented to person, place, and time.  Psychiatric:        Mood and Affect: Mood normal.        Behavior: Behavior normal.        Thought Content: Thought content normal.        Judgment: Judgment normal.      Impression and Plan:  Seasonal allergies  Stress and adjustment reaction  -Advised daily antihistamine and fluticasone use. -She is not interested in CBT as of yet but will reconsider if things become more difficult to cope with.   Time spent:23 minutes reviewing chart, interviewing and examining patient and formulating plan of care.     Chaya Jan, MD [facility] at [facility]

## 2023-03-29 ENCOUNTER — Other Ambulatory Visit: Payer: Self-pay | Admitting: Internal Medicine

## 2023-03-29 DIAGNOSIS — F419 Anxiety disorder, unspecified: Secondary | ICD-10-CM

## 2023-03-30 ENCOUNTER — Other Ambulatory Visit: Payer: Self-pay | Admitting: Neurosurgery

## 2023-03-30 DIAGNOSIS — S32010S Wedge compression fracture of first lumbar vertebra, sequela: Secondary | ICD-10-CM

## 2023-04-02 NOTE — Progress Notes (Signed)
ADVANCED HEART FAILURE CLINIC NOTE  Referring Physician: Philip Aspen, Almira Bar*  Primary Care: Philip Aspen, Limmie Patricia, MD Primary Cardiologist: Dr. Rennis Golden  HF: Dr. Gasper Lloyd  HPI: Kathy [name] is a 72 y.o. female with HFpEF, paroxysmal atrial fibrillation, OSA (CPAP), hyperlipidemia and HTN presenting today for evaluation. Kathy [name] cardiac history dates back to 2019 when she presented to the ER  w/ complaints of dyspnea. She was found to have atrial fibrillation  w/ TTE demonstrating preserved LV function and severe LVH. She converted to NSR with metoprolol. Since that time she has also underwent LHC w/ nonobstructive CAD. Over the past 2 years she has continued to struggle with symptomatic paroxysmal atrial fibrillation. In 11/23 she was admitted with acute on chronic HFpEF; TTE at that time with dilated RV w/ moderately reduced systolic function. She was diuresed and discharged home. Since that time she has her functional status remains fairly limited.  She can perform all ADLs independently but frequently becomes very fatigued and short of breath after walking over 30 to 40 feet.  No PND and currently lower extremity edema is fairly well-controlled however she does quickly become hypervolemic with any dietary indiscretion. Kathy [name] has undergone RHC confirming RV dysfunction with reduced cardiac output/index likely secondary to long term OSA and severe TR. In addition, she has had afib for a prolonged period; underwent TEE/DCCV with conversion to sinus rhythm. cMRI in April 2024 demonstrated preserved LV function, severely dilated RV with RVEF 58%, D-shaped interventricular septum suggesting degree of RV pressure/volume overload, moderate to severe TR.  Interval hx: Functional status had been improving, but not feeling well over the last 3-4 days. Reports more orthopnea and dyspnea at rest. + vomiting and diarrhea. No lower extremity edema. Weight has been ranging 297-298 lb, down  to 294 lb today. Notes decreased po intake the last couple of days. No recent medication changes. Has been compliant. Doesn't feel she is putting out as much urine with current dose of lasix.   She has been very stressed caring for her 76 y.o. son who has stage IV neck cancer.   Past Medical History:  Diagnosis Date   Adenomatous colon polyp    2012   Anxiety and depression    Arthritis    Osteoarthritis, Dr Despina Hick   Cancer [facility])    skin - left foot excised   Chronic diastolic CHF (congestive heart failure) (HCC)    DDD (degenerative disc disease), cervical    Deaf, right    DVT (deep venous thrombosis) (HCC)    RLE DVT 08/2004, post right knee arthroscopy   Fibromyalgia    Dr Azzie Roup, Rheu   GERD (gastroesophageal reflux disease)    H/O hiatal hernia    Headache(784.0)    PAST HX MILD MIGRAINES - resolved per patient 08/18/20   History of kidney stones    passed stones   HTN (hypertension)    IBS (irritable bowel syndrome)    Internal hemorrhoid    Lumbar radiculopathy    Obesity    OSA on CPAP    Moderate with AHI 20/hr with nocturnal hypoxemia on CPAP   PAF (paroxysmal atrial fibrillation) (HCC) 06/2018   Pulmonary hypertension (HCC)     Current Outpatient Medications  Medication Sig Dispense Refill   ALPRAZolam (XANAX) 1 MG tablet TAKE 1 TABLET BY MOUTH THREE TIMES A DAY AS NEEDED FOR ANXIETY 90 tablet 0   amiodarone (PACERONE) 200 MG tablet Take 1 tablet (200 mg total) by mouth  2 (two) times daily. Please cancel all previous orders for current medication. Change in dosage or pill size. 180 tablet 3   atorvastatin (LIPITOR) 40 MG tablet TAKE 1 TABLET BY MOUTH EVERY DAY 90 tablet 3   Calcium Carb-Cholecalciferol (CALCIUM 600 + D PO) Take 1 tablet by mouth daily.     cholecalciferol (VITAMIN D3) 25 MCG (1000 UNIT) tablet Take 1,000 Units by mouth once a week. For 12 weeks     clindamycin (CLEOCIN T) 1 % lotion Apply 1 application  topically 2 (two) times daily as  needed (rosacea).     clobetasol (TEMOVATE) 0.05 % external solution Apply 1 application topically daily as needed (rosacea).     cyclobenzaprine (FLEXERIL) 5 MG tablet Take 5 mg by mouth at bedtime as needed for muscle spasms.     ELIQUIS 5 MG TABS tablet TAKE 1 TABLET BY MOUTH TWICE A DAY 180 tablet 3   fluocinonide (LIDEX) 0.05 % external solution Apply 1 application topically daily as needed (rosacea).     FLUoxetine (PROZAC) 40 MG capsule TAKE 1 CAPSULE BY MOUTH EVERY DAY 90 capsule 1   furosemide (LASIX) 40 MG tablet Take 1 tablet (40 mg total) by mouth daily. 90 tablet 1   gabapentin (NEURONTIN) 300 MG capsule Take 1 capsule (300 mg total) by mouth at bedtime. 90 capsule 1   HYDROcodone-acetaminophen (NORCO/VICODIN) 5-325 MG tablet Take 1 tablet by mouth every 6 (six) hours as needed for moderate pain. 30 tablet 0   methocarbamol (ROBAXIN) 500 MG tablet Take 500 mg by mouth every 8 (eight) hours as needed.     metroNIDAZOLE (METROCREAM) 0.75 % cream Apply 1 application topically 2 (two) times daily as needed (rosacea).      Multiple Vitamin (MULTIVITAMIN WITH MINERALS) TABS tablet Take 1 tablet by mouth daily. Centrum Silver     nitroGLYCERIN (NITROSTAT) 0.4 MG SL tablet Place 1 tablet (0.4 mg total) under the tongue every 5 (five) minutes as needed for chest pain. 90 tablet 3   omeprazole (PRILOSEC) 40 MG capsule TAKE 1 CAPSULE BY MOUTH EVERY DAY 90 capsule 1   potassium chloride (KLOR-CON) 10 MEQ tablet TAKE 1 TABLET (10 MEQ TOTAL) BY MOUTH DAILY. PLEASE KEEPS APPOINTMENT FOR FUTURE REFILLS. THANK YOU. 90 tablet 3   sacubitril-valsartan (ENTRESTO) 49-51 MG Take 1 tablet by mouth 2 (two) times daily. 60 tablet 11   No current facility-administered medications for this visit.    Allergies  Allergen Reactions   Achromycin [Tetracycline Hcl] Shortness Of Breath    Dyspnea     Penicillins     Facial angioedema Did it involve swelling of the face/tongue/throat, SOB, or low BP? Yes Did  it involve sudden or severe rash/hives, skin peeling, or any reaction on the inside of your mouth or nose? No Did you need to seek medical attention at a hospital or doctor's office? No When did it last happen?      30+ years If all above answers are "NO", may proceed with cephalosporin use.    Sulfamethoxazole-Trimethoprim Hives   Nickel Itching   Zanaflex [Tizanidine Hydrochloride]     Causes more pain       Social History   Socioeconomic History   Marital status: Widowed    Spouse name: Not on file   Number of children: 2   Years of education: Not on file   Highest education level: 12th grade  Occupational History   Occupation: retired  Tobacco Use   Smoking status: Never  Smokeless tobacco: Never  Vaping Use   Vaping status: Never Used  Substance and Sexual Activity   Alcohol use: Yes    Comment: occasional   Drug use: No   Sexual activity: Not on file    Comment: Hysterectomy  Other Topics Concern   Not on file  Social History Narrative   0 caffeine drinks, likes to read, crotchet, spend time with family & friends, very social   Social Determinants of Health   Financial Resource Strain: Low Risk  (12/27/2022)   Overall Financial Resource Strain (CARDIA)    Difficulty of Paying Living Expenses: Not hard at all  Food Insecurity: No Food Insecurity (12/28/2022)   Hunger Vital Sign    Worried About Running Out of Food in the Last Year: Never true    Ran Out of Food in the Last Year: Never true  Transportation Needs: No Transportation Needs (12/27/2022)   PRAPARE - Administrator, Civil Service (Medical): No    Lack of Transportation (Non-Medical): No  Physical Activity: Insufficiently Active (12/27/2022)   Exercise Vital Sign    Days of Exercise per Week: 2 days    Minutes of Exercise per Session: 30 min  Stress: No Stress Concern Present (12/27/2022)   [employer] of Occupational Health - Occupational Stress Questionnaire    Feeling of Stress :  Only a little  Social Connections: Moderately Integrated (12/27/2022)   Social Connection and Isolation Panel [NHANES]    Frequency of Communication with Friends and Family: More than three times a week    Frequency of Social Gatherings with Friends and Family: More than three times a week    Attends Religious Services: 1 to 4 times per year    Active Member of [employer] or Organizations: Yes    Attends Banker Meetings: 1 to 4 times per year    Marital Status: Widowed  Recent Concern: Social Connections - Moderately Isolated (11/15/2022)   Social Connection and Isolation Panel [NHANES]    Frequency of Communication with Friends and Family: More than three times a week    Frequency of Social Gatherings with Friends and Family: Three times a week    Attends Religious Services: More than 4 times per year    Active Member of Clubs or Organizations: No    Attends Banker Meetings: Never    Marital Status: Widowed  Intimate Partner Violence: Not At Risk (11/15/2022)   Humiliation, Afraid, Rape, and Kick questionnaire    Fear of Current or Ex-Partner: No    Emotionally Abused: No    Physically Abused: No    Sexually Abused: No      Family History  Problem Relation Age of Onset   Heart attack Mother 83   COPD Mother    Breast cancer Mother 26   Hypertension Father    Diabetes Father    Heart failure Father 72   Breast cancer Paternal Aunt        cns mets   COPD Maternal Grandmother    Heart disease Paternal Grandmother        died 52   Diabetes Paternal Grandmother    Stroke Paternal Grandfather        in late 62s   Heart attack Brother 30   Colon cancer Neg Hx     PHYSICAL EXAM: Vitals:   04/03/23 1019  BP: 118/72  Pulse: 61  SpO2: (!) 89%   GENERAL: Appears fatigued HEENT: Negative for arcus senilis or  xanthelasma. There is no scleral icterus.  The mucous membranes are pink and moist.   NECK: Supple, No masses. Normal carotid upstrokes without  bruits. No masses or thyromegaly.    CHEST: There are no chest wall deformities. There is no chest wall tenderness. Respirations are unlabored.  Lungs- Diminished CARDIAC:  JVP: Difficult d/t neck size but appears elevated         Normal rate with regular rhythm. No murmurs, rubs or gallops.  Pulses are 2+ and symmetrical in upper and lower extremities. 2+  edema.  ABDOMEN: Soft, non-tender, non-distended.  EXTREMITIES: Warm and well perfused with no cyanosis, clubbing.  LYMPHATIC: No axillary or supraclavicular lymphadenopathy.  NEUROLOGIC: Patient is oriented x3 with no focal or lateralizing neurologic deficits.  PSYCH: Patients affect is appropriate. SKIN: Warm and dry; no lesions or wounds.    DATA REVIEW  ECG: NSR 61 bpm  cMRI 12/25/22: LVEF 69%, RVEF 58%, RV severely dilated, D-shaped IVS suggestive of RV pressure/volume overload, nonspecific LGE at basal to mid inferoseptal RV insertion site, moderate to severe TR with regurgitant fraction of 36% and regurgitant volume of 65 cc  ECHO: 07/27/22: LVEF 50-55%, RV is dilated with mild to moderately reduced systolic function. RA is severely dilated. There is severe eccentric TR.    CATH: 06/26/18:  Ost RCA lesion is 30% stenosed. The left ventricular systolic function is normal. LV end diastolic pressure is mildly elevated. The left ventricular ejection fraction is 55-65% by visual estimate.   10/10/22: RA:                  13 mmHg (mean) RV:                  41/10-15 mmHg PA:                  40/15 mmHg (21 mean) PCWP:            14 mmHg (mean)                                      Estimated Fick CO/CI   4.39 L/min, 1.9 L/min/m2 Thermodilution CO/CI   3.61 L/min, 1.5 L/min/m2                                            TPG                 7  mmHg                                              PVR                 1.6-1.94 Wood Units  PAPi                1.9       IMPRESSION: Severely elevated pre-capillary pressures.  Normal  left sided filling pressures Normal PVR and PA mean.  Severely reduced cardiac index likely secondary to suboptimal RV function. (VZD:GLOV of 1, PAPi of 1.9. )   ASSESSMENT & PLAN:  RV Failure/Severe TR - Echocardiograms dating back to 2019 demonstrate severe TR. On her initial TTE in  2019 she has severe TR with mildly dilated RV and preserved LV function. Over the past 3 years, echocardiograms demonstrate gradual progression of TR and worsening of RV function.  Uncertain if this is primary TR or a sequale of underlying pulmonary hypertension.  - RHC/TEE on 10/10/22 confirming RV failure with severe TR. Likely has had prolonged atrial fibrillation and OSA.  - Cardiac MRI 04/24 with RVEF 58%, severely dilated RV and evidence of moderate to severe TR. - Initially noted significant improvement in functional status with cardioversion from Afib >> sinus rhythm. More shortness of breath last few days. See below. - Will consider triclip in the future  2. Heart failure w/ preserved EF  - Most recent TTE w/ indeterminate diastolic function; tissue doppler not consistent with significant diastolic dysfunction.  - NYHA IIIb symptoms - Volume overloaded today. ReDS 52%. Weight down but she's also had diarrhea and decreased po intake.  - Use Furoscix X 1 today. Then increase lasix from 40 mg to 80 mg daily X 3-4 days. Increase K to 20 mEq daily. - Continue Entresto 49/51 mg daily - Consider spiro next visit - Check CMET  3. Atrial fibrillation  - Continue Apixaban 5mg  BID - Now s/p TEE/DCCV in January 2024. - Maintaining SR - Continue Amiodarone 200mg  BID. Check TSH and LFTs  4. OSA  - AHI 20/hr with O2 sats as low as 79%.  - Now tolerating CPAP well.   5. Diarrhea - Suspect gastroenteritis - Checking CMET and CBC   Kathy [name] Advanced Heart Failure Mechanical Circulatory Support

## 2023-04-03 ENCOUNTER — Encounter (HOSPITAL_COMMUNITY): Payer: Self-pay | Admitting: Cardiology

## 2023-04-03 ENCOUNTER — Ambulatory Visit (HOSPITAL_COMMUNITY)
Admission: RE | Admit: 2023-04-03 | Discharge: 2023-04-03 | Disposition: A | Discharge status: Home / Self Care | Payer: Medicare Other | Source: Ambulatory Visit | Attending: Cardiology | Admitting: Cardiology

## 2023-04-03 VITALS — BP 118/72 | HR 61

## 2023-04-03 DIAGNOSIS — I110 Hypertensive heart disease with heart failure: Secondary | ICD-10-CM | POA: Diagnosis not present

## 2023-04-03 DIAGNOSIS — I5032 Chronic diastolic (congestive) heart failure: Secondary | ICD-10-CM

## 2023-04-03 DIAGNOSIS — R197 Diarrhea, unspecified: Secondary | ICD-10-CM | POA: Insufficient documentation

## 2023-04-03 DIAGNOSIS — Z7901 Long term (current) use of anticoagulants: Secondary | ICD-10-CM | POA: Insufficient documentation

## 2023-04-03 DIAGNOSIS — I480 Paroxysmal atrial fibrillation: Secondary | ICD-10-CM | POA: Insufficient documentation

## 2023-04-03 DIAGNOSIS — I2510 Atherosclerotic heart disease of native coronary artery without angina pectoris: Secondary | ICD-10-CM | POA: Insufficient documentation

## 2023-04-03 DIAGNOSIS — Z79899 Other long term (current) drug therapy: Secondary | ICD-10-CM | POA: Diagnosis not present

## 2023-04-03 DIAGNOSIS — I50810 Right heart failure, unspecified: Secondary | ICD-10-CM

## 2023-04-03 DIAGNOSIS — I272 Pulmonary hypertension, unspecified: Secondary | ICD-10-CM | POA: Insufficient documentation

## 2023-04-03 DIAGNOSIS — I5033 Acute on chronic diastolic (congestive) heart failure: Secondary | ICD-10-CM | POA: Diagnosis not present

## 2023-04-03 DIAGNOSIS — G4733 Obstructive sleep apnea (adult) (pediatric): Secondary | ICD-10-CM | POA: Diagnosis not present

## 2023-04-03 LAB — CBC
HCT: 36.3 % (ref 36.0–46.0)
Hemoglobin: 11.3 g/dL — ABNORMAL LOW (ref 12.0–15.0)
MCH: 27 pg (ref 26.0–34.0)
MCHC: 31.1 g/dL (ref 30.0–36.0)
MCV: 86.8 fL (ref 80.0–100.0)
Platelets: 269 10*3/uL (ref 150–400)
RBC: 4.18 MIL/uL (ref 3.87–5.11)
RDW: 16.8 % — ABNORMAL HIGH (ref 11.5–15.5)
WBC: 8.5 10*3/uL (ref 4.0–10.5)
nRBC: 0 % (ref 0.0–0.2)

## 2023-04-03 LAB — COMPREHENSIVE METABOLIC PANEL
ALT: 36 U/L (ref 0–44)
AST: 36 U/L (ref 15–41)
Albumin: 3.2 g/dL — ABNORMAL LOW (ref 3.5–5.0)
Alkaline Phosphatase: 82 U/L (ref 38–126)
Anion gap: 9 (ref 5–15)
BUN: 19 mg/dL (ref 8–23)
CO2: 27 mmol/L (ref 22–32)
Calcium: 8.8 mg/dL — ABNORMAL LOW (ref 8.9–10.3)
Chloride: 97 mmol/L — ABNORMAL LOW (ref 98–111)
Creatinine, Ser: 1.33 mg/dL — ABNORMAL HIGH (ref 0.44–1.00)
GFR, Estimated: 43 mL/min — ABNORMAL LOW (ref >60)
Glucose, Bld: 111 mg/dL — ABNORMAL HIGH (ref 70–99)
Potassium: 4.1 mmol/L (ref 3.5–5.1)
Sodium: 133 mmol/L — ABNORMAL LOW (ref 135–145)
Total Bilirubin: 1 mg/dL (ref 0.3–1.2)
Total Protein: 6.7 g/dL (ref 6.5–8.1)

## 2023-04-03 LAB — BRAIN NATRIURETIC PEPTIDE: B Natriuretic Peptide: 418.5 pg/mL — ABNORMAL HIGH (ref 0.0–100.0)

## 2023-04-03 LAB — TSH: TSH: 1.383 u[IU]/mL (ref 0.350–4.500)

## 2023-04-03 MED ORDER — POTASSIUM CHLORIDE ER 10 MEQ PO TBCR: 20.0000 meq | EXTENDED_RELEASE_TABLET | Freq: Every day | ORAL | 3 refills | Status: DC

## 2023-04-03 NOTE — Patient Instructions (Signed)
CHANGE Lasix to 60-80 mg daily for 3-4 days, then back to normal dose.  INCREASE Potassium to 2 tablets daily.  Ensure you write down the time you start your infusion so that if there is a problem you will know how long the infusion lasted  Use Furoscix only AS DIRECTED by our office  Dosing Directions:   Day 1= take the kit once you get home today 04/03/23.  Labs done today, your results will be available in MyChart, we will contact you for abnormal readings.   Your physician recommends that you schedule a follow-up appointment in: 1 week with the Nurse Practitioner and in 6 weeks with Dr. Gasper [name].  If you have any questions or concerns before your next appointment please send [name] a message through [facility] or call our office at [phone].    TO LEAVE A MESSAGE FOR THE NURSE SELECT OPTION 2, PLEASE LEAVE A MESSAGE INCLUDING: YOUR NAME DATE OF BIRTH CALL BACK NUMBER REASON FOR CALL**this is important as we prioritize the call backs  YOU WILL RECEIVE A CALL BACK THE SAME DAY AS LONG AS YOU CALL BEFORE 4:00 PM  At the [facility], you and your health needs are our priority. As part of our continuing mission to provide you with exceptional heart care, we have created designated Provider Care Teams. These Care Teams include your primary Cardiologist (physician) and Advanced Practice Providers (APPs- Physician Assistants and Nurse Practitioners) who all work together to provide you with the care you need, when you need it.   You may see any of the following providers on your designated Care Team at your next follow up: Dr Arvilla Meres Dr Marca Ancona Dr. Marcos Eke, NP Robbie Lis, [place] [facility] [place], [place] Brynda Peon, NP Karle Plumber, PharmD   Please be sure to bring in all your medications bottles to every appointment.    Thank you for choosing [facility]

## 2023-04-03 NOTE — Progress Notes (Signed)
ReDS Vest / Clip - 04/03/23 1000       ReDS Vest / Clip   Station Marker C    Ruler Value 26    ReDS Value Range High volume overload    ReDS Actual Value 52

## 2023-04-05 ENCOUNTER — Encounter (HOSPITAL_COMMUNITY): Payer: Medicare Other | Admitting: Cardiology

## 2023-04-08 ENCOUNTER — Ambulatory Visit
Admission: RE | Admit: 2023-04-08 | Discharge: 2023-04-08 | Disposition: A | Discharge status: Home / Self Care | Payer: Medicare Other | Source: Ambulatory Visit | Attending: Neurosurgery | Admitting: Neurosurgery

## 2023-04-08 DIAGNOSIS — S32010S Wedge compression fracture of first lumbar vertebra, sequela: Secondary | ICD-10-CM

## 2023-04-10 NOTE — Progress Notes (Signed)
ADVANCED HEART FAILURE CLINIC NOTE  Referring Physician: Philip Aspen, Almira Bar*  Primary Care: Philip Aspen, Limmie Patricia, MD Primary Cardiologist: Dr. Rennis Golden  HF: Dr. Gasper Lloyd Cardiology, Sleep: Dr Mayford Knife  HPI: Kathy [name] is a 72 y.o. female with HFpEF, paroxysmal atrial fibrillation, OSA (CPAP), hyperlipidemia and HTN .    Kathy [name]'s cardiac history dates back to 2019 when she presented to the ER  w/ complaints of dyspnea. She was found to have atrial fibrillation  w/ TTE demonstrating preserved LV function and severe LVH. She converted to NSR with metoprolol. Since that time she has also underwent LHC w/ nonobstructive CAD. Over the past 2 years she has continued to struggle with symptomatic paroxysmal atrial fibrillation. In 11/23 she was admitted with acute on chronic HFpEF; TTE at that time with dilated RV w/ moderately reduced systolic function. She was diuresed and discharged home. Since that time she has her functional status remains fairly limited.  She can perform all ADLs independently but frequently becomes very fatigued and short of breath after walking over 30 to 40 feet.  No PND and currently lower extremity edema is fairly well-controlled however she does quickly become hypervolemic with any dietary indiscretion. Kathy [name] has undergone RHC confirming RV dysfunction with reduced cardiac output/index likely secondary to long term OSA and severe TR. In addition, she has had afib for a prolonged period; underwent TEE/DCCV with conversion to sinus rhythm. cMRI in April 2024 demonstrated preserved LV function, severely dilated RV with RVEF 58%, D-shaped interventricular septum suggesting degree of RV pressure/volume overload, moderate to severe TR.  She was seen by Dr Gasper Lloyd in the HF clinic 04/03/23. Reds Clip 52%. Volume overloaded Given Furoscix and oral diuretics increased to 80 mg daily x3 days then back to 40 mg daily.  Had an excellent response.  response to  increased diuretics.   Today she returns for HF follow up. Overall feeling much better. Occasionally short of breath. Denies PND/Orthopnea. Using CPAP every night Having occasional diarrhea. Having ongoing back pain. Appetite ok. Trying to follow low salt/low fat diet. She has cut out her soft drinks. No fever or chills. Weight at home has gone down to 282 pounds!!! Taking all medications. Stressed caring for her 61 y.o. son who has stage IV neck cancer.   Past Medical History:  Diagnosis Date   Adenomatous colon polyp    2012   Anxiety and depression    Arthritis    Osteoarthritis, Dr Despina Hick   [facility])    skin - left foot excised   Chronic diastolic CHF (congestive heart failure) (HCC)    DDD (degenerative disc disease), cervical    Deaf, right    DVT (deep venous thrombosis) (HCC)    RLE DVT 08/2004, post right knee arthroscopy   Fibromyalgia    Dr Azzie Roup, Rheu   GERD (gastroesophageal reflux disease)    H/O hiatal hernia    Headache(784.0)    PAST HX MILD MIGRAINES - resolved per patient 08/18/20   History of kidney stones    passed stones   HTN (hypertension)    IBS (irritable bowel syndrome)    Internal hemorrhoid    Lumbar radiculopathy    Obesity    OSA on CPAP    Moderate with AHI 20/hr with nocturnal hypoxemia on CPAP   PAF (paroxysmal atrial fibrillation) (HCC) 06/2018   Pulmonary hypertension (HCC)     Current Outpatient Medications  Medication Sig Dispense Refill   ALPRAZolam (XANAX) 1 MG  tablet TAKE 1 TABLET BY MOUTH THREE TIMES A DAY AS NEEDED FOR ANXIETY 90 tablet 0   amiodarone (PACERONE) 200 MG tablet Take 1 tablet (200 mg total) by mouth 2 (two) times daily. Please cancel all previous orders for current medication. Change in dosage or pill size. 180 tablet 3   atorvastatin (LIPITOR) 40 MG tablet TAKE 1 TABLET BY MOUTH EVERY DAY 90 tablet 3   Calcium Carb-Cholecalciferol (CALCIUM 600 + D PO) Take 1 tablet by mouth daily.     cholecalciferol  (VITAMIN D3) 25 MCG (1000 UNIT) tablet Take 1,000 Units by mouth once a week. For 12 weeks     clindamycin (CLEOCIN T) 1 % lotion Apply 1 application  topically 2 (two) times daily as needed (rosacea).     clobetasol (TEMOVATE) 0.05 % external solution Apply 1 application topically daily as needed (rosacea).     cyclobenzaprine (FLEXERIL) 5 MG tablet Take 5 mg by mouth at bedtime as needed for muscle spasms.     ELIQUIS 5 MG TABS tablet TAKE 1 TABLET BY MOUTH TWICE A DAY 180 tablet 3   fluocinonide (LIDEX) 0.05 % external solution Apply 1 application topically daily as needed (rosacea).     FLUoxetine (PROZAC) 40 MG capsule TAKE 1 CAPSULE BY MOUTH EVERY DAY 90 capsule 1   furosemide (LASIX) 40 MG tablet Take 1 tablet (40 mg total) by mouth daily. 90 tablet 1   gabapentin (NEURONTIN) 300 MG capsule Take 1 capsule (300 mg total) by mouth at bedtime. 90 capsule 1   HYDROcodone-acetaminophen (NORCO/VICODIN) 5-325 MG tablet Take 1 tablet by mouth every 6 (six) hours as needed for moderate pain. 30 tablet 0   metroNIDAZOLE (METROCREAM) 0.75 % cream Apply 1 application topically 2 (two) times daily as needed (rosacea).      Multiple Vitamin (MULTIVITAMIN WITH MINERALS) TABS tablet Take 1 tablet by mouth daily. Centrum Silver     omeprazole (PRILOSEC) 40 MG capsule TAKE 1 CAPSULE BY MOUTH EVERY DAY 90 capsule 1   potassium chloride (KLOR-CON) 10 MEQ tablet Take 2 tablets (20 mEq total) by mouth daily. 180 tablet 3   sacubitril-valsartan (ENTRESTO) 49-51 MG Take 1 tablet by mouth 2 (two) times daily. 60 tablet 11   nitroGLYCERIN (NITROSTAT) 0.4 MG SL tablet Place 1 tablet (0.4 mg total) under the tongue every 5 (five) minutes as needed for chest pain. 90 tablet 3   No current facility-administered medications for this encounter.    Allergies  Allergen Reactions   Achromycin [Tetracycline Hcl] Shortness Of Breath    Dyspnea     Penicillins     Facial angioedema Did it involve swelling of the  face/tongue/throat, SOB, or low BP? Yes Did it involve sudden or severe rash/hives, skin peeling, or any reaction on the inside of your mouth or nose? No Did you need to seek medical attention at a hospital or doctor's office? No When did it last happen?      30+ years If all above answers are "NO", may proceed with cephalosporin use.    Sulfamethoxazole-Trimethoprim Hives   Nickel Itching   Zanaflex [Tizanidine Hydrochloride]     Causes more pain       Social History   Socioeconomic History   Marital status: Widowed    Spouse name: Not on file   Number of children: 2   Years of education: Not on file   Highest education level: 12th grade  Occupational History   Occupation: retired  Tobacco Use  Smoking status: Never   Smokeless tobacco: Never  Vaping Use   Vaping status: Never Used  Substance and Sexual Activity   Alcohol use: Yes    Comment: occasional   Drug use: No   Sexual activity: Not on file    Comment: Hysterectomy  Other Topics Concern   Not on file  Social History Narrative   0 caffeine drinks, likes to read, crotchet, spend time with family & friends, very social   Social Determinants of Health   Financial Resource Strain: Low Risk  (12/27/2022)   Overall Financial Resource Strain (CARDIA)    Difficulty of Paying Living Expenses: Not hard at all  Food Insecurity: No Food Insecurity (12/28/2022)   Hunger Vital Sign    Worried About Running Out of Food in the Last Year: Never true    Ran Out of Food in the Last Year: Never true  Transportation Needs: No Transportation Needs (12/27/2022)   PRAPARE - Administrator, Civil Service (Medical): No    Lack of Transportation (Non-Medical): No  Physical Activity: Insufficiently Active (12/27/2022)   Exercise Vital Sign    Days of Exercise per Week: 2 days    Minutes of Exercise per Session: 30 min  Stress: No Stress Concern Present (12/27/2022)   [employer] of Occupational Health - Occupational  Stress Questionnaire    Feeling of Stress : Only a little  Social Connections: Moderately Integrated (12/27/2022)   Social Connection and Isolation Panel [NHANES]    Frequency of Communication with Friends and Family: More than three times a week    Frequency of Social Gatherings with Friends and Family: More than three times a week    Attends Religious Services: 1 to 4 times per year    Active Member of [employer] or Organizations: Yes    Attends Banker Meetings: 1 to 4 times per year    Marital Status: Widowed  Recent Concern: Social Connections - Moderately Isolated (11/15/2022)   Social Connection and Isolation Panel [NHANES]    Frequency of Communication with Friends and Family: More than three times a week    Frequency of Social Gatherings with Friends and Family: Three times a week    Attends Religious Services: More than 4 times per year    Active Member of Clubs or Organizations: No    Attends Banker Meetings: Never    Marital Status: Widowed  Intimate Partner Violence: Not At Risk (11/15/2022)   Humiliation, Afraid, Rape, and Kick questionnaire    Fear of Current or Ex-Partner: No    Emotionally Abused: No    Physically Abused: No    Sexually Abused: No      Family History  Problem Relation Age of Onset   Heart attack Mother 64   COPD Mother    Breast cancer Mother 33   Hypertension Father    Diabetes Father    Heart failure Father 34   Breast cancer Paternal Aunt        cns mets   COPD Maternal Grandmother    Heart disease Paternal Grandmother        died 67   Diabetes Paternal Grandmother    Stroke Paternal Grandfather        in late 45s   Heart attack Brother 59   Colon cancer Neg Hx     PHYSICAL EXAM: Vitals:   04/12/23 1538  BP: 132/72  Pulse: 61  SpO2: 93%   Wt Readings from Last 3  Encounters:  04/12/23 129.2 kg (284 lb 12.8 oz)  03/28/23 134.7 kg (297 lb)  01/24/23 126.6 kg (279 lb 3.2 oz)    General:  Well appearing. No  resp difficulty. Wearing back brace.  HEENT: normal Neck: supple. no JVD. Carotids 2+ bilat; no bruits. No lymphadenopathy or thryomegaly appreciated. Cor: PMI nondisplaced. Regular rate & rhythm. No rubs, gallops or murmurs. Lungs: clear Abdomen: obese, soft, nontender, nondistended. No hepatosplenomegaly. No bruits or masses. Good bowel sounds. Extremities: no cyanosis, clubbing, rash, edema Neuro: alert & orientedx3, cranial nerves grossly intact. moves all 4 extremities w/o difficulty. Affect pleasant   DATA REVIEW  cMRI 12/25/22: LVEF 69%, RVEF 58%, RV severely dilated, D-shaped IVS suggestive of RV pressure/volume overload, nonspecific LGE at basal to mid inferoseptal RV insertion site, moderate to severe TR with regurgitant fraction of 36% and regurgitant volume of 65 cc  ECHO: 07/27/22: LVEF 50-55%, RV is dilated with mild to moderately reduced systolic function. RA is severely dilated. There is severe eccentric TR.    CATH: 06/26/18:  Ost RCA lesion is 30% stenosed. The left ventricular systolic function is normal. LV end diastolic pressure is mildly elevated. The left ventricular ejection fraction is 55-65% by visual estimate.   10/10/22: RA:                  13 mmHg (mean) RV:                  41/10-15 mmHg PA:                  40/15 mmHg (21 mean) PCWP:            14 mmHg (mean)                                      Estimated Fick CO/CI   4.39 L/min, 1.9 L/min/m2 Thermodilution CO/CI   3.61 L/min, 1.5 L/min/m2                                            TPG                 7  mmHg                                              PVR                 1.6-1.94 Wood Units  PAPi                1.9       IMPRESSION: Severely elevated pre-capillary pressures.  Normal left sided filling pressures Normal PVR and PA mean.  Severely reduced cardiac index likely secondary to suboptimal RV function. (BMW:UXLK of 1, PAPi of 1.9. )   ASSESSMENT & PLAN:  RV Failure/Severe TR -  Echocardiograms dating back to 2019 demonstrate severe TR. On her initial TTE in 2019 she has severe TR with mildly dilated RV and preserved LV function. Over the past 3 years, echocardiograms demonstrate gradual progression of TR and worsening of RV function.  Uncertain if this is primary TR or a sequale of underlying pulmonary hypertension.  -  RHC/TEE on 10/10/22 confirming RV failure with severe TR. Likely has had prolonged atrial fibrillation and OSA.  - Cardiac MRI 04/24 with RVEF 58%, severely dilated RV and evidence of moderate to severe TR. - Initially noted significant improvement in functional status with cardioversion from Afib >> sinus rhythm.  - Volume status much improved.  - Will consider triclip in the future  2. Heart failure w/ preserved EF  - Most recent TTE w/ indeterminate diastolic function; tissue doppler not consistent with significant diastolic dysfunction.  - NYHA III. REDs Clip 33%. Excellent response using FUROSCIX.  - Euvolemic. Continue lasix 40 mg daily.  -  Continue Entresto 49/51 mg daily - Consider spiro if BMET ok.  - Check BMET   3. Atrial fibrillation  - Continue Apixaban 5mg  BID - Now s/p TEE/DCCV in January 2024--->EKG 04/03/23 SR  - Cut amio  to 200 mg daily.   -TSH/LFTs stable. - Continue CPAP nightly    4. OSA  - AHI 20/hr with O2 sats as low as 79%.  - Continue CPAP nightly.   5. Diarrhea - Suspect gastroenteritis  6. Obesity  Body mass index is 45.97 kg/m. She has been trying to lose weight. Discussed portion control.  Consider GLP1. She would like to think about it.   Check BMET today.   SDOH: Given HFSW contact information should she need counseling/support. Son has Stage IV neck cancer. He has refused Hospice.   Follow up next month with Dr Gasper Lloyd.     Tonye Becket NP-C

## 2023-04-12 ENCOUNTER — Encounter (HOSPITAL_COMMUNITY): Payer: Self-pay

## 2023-04-12 ENCOUNTER — Ambulatory Visit (HOSPITAL_COMMUNITY)
Admission: RE | Admit: 2023-04-12 | Discharge: 2023-04-12 | Disposition: A | Discharge status: Home / Self Care | Payer: Medicare Other | Source: Ambulatory Visit | Attending: Adult Health | Admitting: Adult Health

## 2023-04-12 VITALS — BP 132/72 | HR 61 | Wt 284.8 lb

## 2023-04-12 DIAGNOSIS — Z7901 Long term (current) use of anticoagulants: Secondary | ICD-10-CM | POA: Insufficient documentation

## 2023-04-12 DIAGNOSIS — I272 Pulmonary hypertension, unspecified: Secondary | ICD-10-CM | POA: Diagnosis not present

## 2023-04-12 DIAGNOSIS — E669 Obesity, unspecified: Secondary | ICD-10-CM | POA: Insufficient documentation

## 2023-04-12 DIAGNOSIS — I480 Paroxysmal atrial fibrillation: Secondary | ICD-10-CM

## 2023-04-12 DIAGNOSIS — Z6841 Body Mass Index (BMI) 40.0 and over, adult: Secondary | ICD-10-CM | POA: Insufficient documentation

## 2023-04-12 DIAGNOSIS — I10 Essential (primary) hypertension: Secondary | ICD-10-CM | POA: Diagnosis not present

## 2023-04-12 DIAGNOSIS — K580 Irritable bowel syndrome with diarrhea: Secondary | ICD-10-CM | POA: Insufficient documentation

## 2023-04-12 DIAGNOSIS — I5030 Unspecified diastolic (congestive) heart failure: Secondary | ICD-10-CM | POA: Diagnosis not present

## 2023-04-12 DIAGNOSIS — E785 Hyperlipidemia, unspecified: Secondary | ICD-10-CM | POA: Diagnosis not present

## 2023-04-12 DIAGNOSIS — I5032 Chronic diastolic (congestive) heart failure: Secondary | ICD-10-CM

## 2023-04-12 DIAGNOSIS — Z79899 Other long term (current) drug therapy: Secondary | ICD-10-CM | POA: Insufficient documentation

## 2023-04-12 DIAGNOSIS — I110 Hypertensive heart disease with heart failure: Secondary | ICD-10-CM | POA: Insufficient documentation

## 2023-04-12 DIAGNOSIS — G4733 Obstructive sleep apnea (adult) (pediatric): Secondary | ICD-10-CM | POA: Diagnosis not present

## 2023-04-12 LAB — BASIC METABOLIC PANEL
Anion gap: 11 (ref 5–15)
BUN: 23 mg/dL (ref 8–23)
CO2: 26 mmol/L (ref 22–32)
Calcium: 8.8 mg/dL — ABNORMAL LOW (ref 8.9–10.3)
Chloride: 97 mmol/L — ABNORMAL LOW (ref 98–111)
Creatinine, Ser: 1.42 mg/dL — ABNORMAL HIGH (ref 0.44–1.00)
GFR, Estimated: 40 mL/min — ABNORMAL LOW (ref >60)
Glucose, Bld: 98 mg/dL (ref 70–99)
Potassium: 4.4 mmol/L (ref 3.5–5.1)
Sodium: 134 mmol/L — ABNORMAL LOW (ref 135–145)

## 2023-04-12 MED ORDER — AMIODARONE HCL 200 MG PO TABS: 200.0000 mg | ORAL_TABLET | Freq: Every day | ORAL | 3 refills | Status: DC

## 2023-04-12 NOTE — Progress Notes (Signed)
ReDS Vest / Clip - 04/12/23 1500       ReDS Vest / Clip   Station Marker D    Ruler Value 36    ReDS Value Range Low volume    ReDS Actual Value 33

## 2023-04-12 NOTE — Patient Instructions (Addendum)
RedsClip done today.  Labs done today. We will contact you only if your labs are abnormal.  DECREASE Amiodarone to 200mg  (1 tablet) by mouth daily.   No other medication changes were made. Please continue all current medications as prescribed.  Your physician recommends that you keep your scheduled follow-up appointment in August.   If you have any questions or concerns before your next appointment please send [name] a message through [place] or call our office at [phone].    TO LEAVE A MESSAGE FOR THE NURSE SELECT OPTION 2, PLEASE LEAVE A MESSAGE INCLUDING: YOUR NAME DATE OF BIRTH CALL BACK NUMBER REASON FOR CALL**this is important as we prioritize the call backs  YOU WILL RECEIVE A CALL BACK THE SAME DAY AS LONG AS YOU CALL BEFORE 4:00 PM   Do the following things EVERYDAY: Weigh yourself in the morning before breakfast. Write it down and keep it in a log. Take your medicines as prescribed Eat low salt foods--Limit salt (sodium) to 2000 mg per day.  Stay as active as you can everyday Limit all fluids for the day to less than 2 liters   At the [facility], you and your health needs are our priority. As part of our continuing mission to provide you with exceptional heart care, we have created designated Provider Care Teams. These Care Teams include your primary Cardiologist (physician) and Advanced Practice Providers (APPs- Physician Assistants and Nurse Practitioners) who all work together to provide you with the care you need, when you need it.   You may see any of the following providers on your designated Care Team at your next follow up: Dr Arvilla Meres Dr Marca Ancona Dr. Marcos Eke, NP Robbie Lis, [place] [facility] [place], [place] Brynda Peon, NP Karle Plumber, PharmD   Please be sure to bring in all your medications bottles to every appointment.    Thank you for choosing [facility]

## 2023-04-13 ENCOUNTER — Telehealth: Payer: Self-pay | Admitting: Cardiology

## 2023-04-13 NOTE — Telephone Encounter (Signed)
Patient states someone left a message regarding her CPAP machine. Patient mentions that her CPAP machine no longer works so she would like to discuss having it replaced.

## 2023-04-15 ENCOUNTER — Other Ambulatory Visit: Payer: Medicare Other

## 2023-04-17 ENCOUNTER — Telehealth: Payer: Self-pay | Admitting: Internal Medicine

## 2023-04-17 NOTE — Telephone Encounter (Signed)
Wanted to let provider know her 71 year old son passed away 05-18-2023 from cancer

## 2023-04-19 NOTE — Telephone Encounter (Signed)
Patients dme went out of the cpap business her new dme is [facility]. Sharon at [facility] will send over patients info.

## 2023-04-25 ENCOUNTER — Other Ambulatory Visit: Payer: Self-pay | Admitting: Internal Medicine

## 2023-04-25 DIAGNOSIS — F419 Anxiety disorder, unspecified: Secondary | ICD-10-CM

## 2023-04-25 NOTE — Telephone Encounter (Signed)
Called patient about Medication usage, unable to leave a voicemail

## 2023-04-27 ENCOUNTER — Encounter: Payer: Self-pay | Admitting: Adult Health

## 2023-04-27 ENCOUNTER — Ambulatory Visit (INDEPENDENT_AMBULATORY_CARE_PROVIDER_SITE_OTHER): Payer: Medicare Other | Admitting: Adult Health

## 2023-04-27 VITALS — BP 110/60 | Temp 98.1°F | Ht 66.0 in | Wt 279.0 lb

## 2023-04-27 DIAGNOSIS — G894 Chronic pain syndrome: Secondary | ICD-10-CM | POA: Diagnosis not present

## 2023-04-27 DIAGNOSIS — R1032 Left lower quadrant pain: Secondary | ICD-10-CM

## 2023-04-27 MED ORDER — METRONIDAZOLE 500 MG PO TABS
500.0000 mg | ORAL_TABLET | Freq: Three times a day (TID) | ORAL | 0 refills | Status: AC
Start: 2023-04-27 — End: 2023-05-07

## 2023-04-27 MED ORDER — CIPROFLOXACIN HCL 500 MG PO TABS
500.0000 mg | ORAL_TABLET | Freq: Two times a day (BID) | ORAL | 0 refills | Status: AC
Start: 2023-04-27 — End: 2023-05-07

## 2023-04-27 MED ORDER — CYCLOBENZAPRINE HCL 5 MG PO TABS
5.0000 mg | ORAL_TABLET | Freq: Every evening | ORAL | 0 refills | Status: DC | PRN
Start: 2023-04-27 — End: 2023-06-04

## 2023-04-27 NOTE — Progress Notes (Signed)
Subjective:    Patient ID: Kathy [name], female    DOB: 1950/12/18, 72 y.o.   MRN: [id number]  Abdominal Pain   72 year old female who  has a past medical history of Adenomatous colon polyp, Anxiety and depression, Arthritis, Cancer (HCC), Chronic diastolic CHF (congestive heart failure) (HCC), DDD (degenerative disc disease), cervical, Deaf, right, DVT (deep venous thrombosis) (HCC), Fibromyalgia, GERD (gastroesophageal reflux disease), H/O hiatal hernia, Headache(784.0), History of kidney stones, HTN (hypertension), IBS (irritable bowel syndrome), Internal hemorrhoid, Lumbar radiculopathy, Obesity, OSA on CPAP, PAF (paroxysmal atrial fibrillation) (HCC) (06/2018), and Pulmonary hypertension (HCC).  She is a patient of Dr. Ardyth Harps who I am seeing today for an acute issue of abdominal pain and diarrhea.  She reports that her symptoms started about 4 weeks ago.  She has not had any blood in her stool and has not noticed any fevers or chills.  She is unsure if this is a recurrence of diverticulitis as her last recurrence was in 2023 or if her GI issues are due to extreme stress due to the loss of her 110 year old son a couple weeks ago from metastatic cancer.  She has had some associated nausea but no vomiting.  Last colonoscopy was in February 2023 which showed multiple small and large mouth diverticula in the sigmoid colon.  She would also like a refill of Flexeril that she takes periodically for back pain.   Review of Systems See HPI   Past Medical History:  Diagnosis Date   Adenomatous colon polyp    2012   Anxiety and depression    Arthritis    Osteoarthritis, Dr Despina Hick   Cancer [facility])    skin - left foot excised   Chronic diastolic CHF (congestive heart failure) (HCC)    DDD (degenerative disc disease), cervical    Deaf, right    DVT (deep venous thrombosis) (HCC)    RLE DVT 08/2004, post right knee arthroscopy   Fibromyalgia    Dr Azzie Roup, Rheu   GERD  (gastroesophageal reflux disease)    H/O hiatal hernia    Headache(784.0)    PAST HX MILD MIGRAINES - resolved per patient 08/18/20   History of kidney stones    passed stones   HTN (hypertension)    IBS (irritable bowel syndrome)    Internal hemorrhoid    Lumbar radiculopathy    Obesity    OSA on CPAP    Moderate with AHI 20/hr with nocturnal hypoxemia on CPAP   PAF (paroxysmal atrial fibrillation) (HCC) 06/2018   Pulmonary hypertension (HCC)     Social History   Socioeconomic History   Marital status: Widowed    Spouse name: Not on file   Number of children: 2   Years of education: Not on file   Highest education level: 12th grade  Occupational History   Occupation: retired  Tobacco Use   Smoking status: Never   Smokeless tobacco: Never  Vaping Use   Vaping status: Never Used  Substance and Sexual Activity   Alcohol use: Yes    Comment: occasional   Drug use: No   Sexual activity: Not on file    Comment: Hysterectomy  Other Topics Concern   Not on file  Social History Narrative   0 caffeine drinks, likes to read, crotchet, spend time with family & friends, very social   Social Determinants of Health   Financial Resource Strain: Low Risk  (12/27/2022)   Overall Financial Resource Strain (CARDIA)  Difficulty of Paying Living Expenses: Not hard at all  Food Insecurity: No Food Insecurity (12/28/2022)   Hunger Vital Sign    Worried About Running Out of Food in the Last Year: Never true    Ran Out of Food in the Last Year: Never true  Transportation Needs: No Transportation Needs (12/27/2022)   PRAPARE - Administrator, Civil Service (Medical): No    Lack of Transportation (Non-Medical): No  Physical Activity: Insufficiently Active (12/27/2022)   Exercise Vital Sign    Days of Exercise per Week: 2 days    Minutes of Exercise per Session: 30 min  Stress: No Stress Concern Present (12/27/2022)   [employer] of Occupational Health - Occupational  Stress Questionnaire    Feeling of Stress : Only a little  Social Connections: Moderately Integrated (12/27/2022)   Social Connection and Isolation Panel [NHANES]    Frequency of Communication with Friends and Family: More than three times a week    Frequency of Social Gatherings with Friends and Family: More than three times a week    Attends Religious Services: 1 to 4 times per year    Active Member of [employer] or Organizations: Yes    Attends Banker Meetings: 1 to 4 times per year    Marital Status: Widowed  Recent Concern: Social Connections - Moderately Isolated (11/15/2022)   Social Connection and Isolation Panel [NHANES]    Frequency of Communication with Friends and Family: More than three times a week    Frequency of Social Gatherings with Friends and Family: Three times a week    Attends Religious Services: More than 4 times per year    Active Member of Clubs or Organizations: No    Attends Banker Meetings: Never    Marital Status: Widowed  Intimate Partner Violence: Not At Risk (11/15/2022)   Humiliation, Afraid, Rape, and Kick questionnaire    Fear of Current or Ex-Partner: No    Emotionally Abused: No    Physically Abused: No    Sexually Abused: No    Past Surgical History:  Procedure Laterality Date   ABDOMINAL HYSTERECTOMY     dysfunctional menses   APPENDECTOMY     BREAST EXCISIONAL BIOPSY Right    CARDIAC CATHETERIZATION  06/26/2018   CARDIOVERSION N/A 10/10/2022   Procedure: CARDIOVERSION;  Surgeon: Dorthula Nettles, DO;  Location: [facility] ENDOSCOPY;  Service: Cardiovascular;  Laterality: N/A;   CHOLECYSTECTOMY     COLONOSCOPY  05/27/2002, 08/08/11   2003 diverticulosis, hemorrhoids 2012 same + cecal polyp   EYE SURGERY     KNEE ARTHROSCOPY  2008   right   KYPHOPLASTY Bilateral 08/19/2020   Procedure: KYPHOPLASTY LUMBAR ONE;  Surgeon: Bedelia Person, MD;  Location: [facility] OR;  Service: Neurosurgery;  Laterality: Bilateral;  posterior   LEFT  HEART CATH AND CORONARY ANGIOGRAPHY N/A 06/26/2018   Procedure: LEFT HEART CATH AND CORONARY ANGIOGRAPHY;  Surgeon: Iran Ouch, MD;  Location: [facility] INVASIVE CV LAB;  Service: Cardiovascular;  Laterality: N/A;   MELANOMA EXCISION Left 07/2013   foot   RIGHT HEART CATH N/A 10/10/2022   Procedure: RIGHT HEART CATH;  Surgeon: Dorthula Nettles, DO;  Location: [facility] INVAS[facility] CV LAB;  Service: Cardiovascular;  Laterality: N/A;   TEE WITHOUT CARDIOVERSION N/A 10/10/2022   Procedure: TRANSESOPHAGEAL ECHOCARDIOGRAM (TEE);  Surgeon: Dorthula Nettles, DO;  Location: [facility] ENDOSCOPY;  Service: Cardiovascular;  Laterality: N/A;   TOTAL HIP ARTHROPLASTY  02/2010   left ; Dr Despina Hick  TOTAL HIP ARTHROPLASTY  03/18/2012   Procedure: TOTAL HIP ARTHROPLASTY;  Surgeon: Loanne Drilling, MD;  Location: [facility] ORS;  Service: Orthopedics;  Laterality: Right;   UPPER GASTROINTESTINAL ENDOSCOPY  04/21/2009, 08/08/11   2010 gastritis ;2012 small hiatal hernia. Dr Leone Payor   WRIST FRACTURE SURGERY Left     Family History  Problem Relation Age of Onset   Heart attack Mother 53   COPD Mother    Breast cancer Mother 36   Hypertension Father    Diabetes Father    Heart failure Father 36   Breast cancer Paternal Aunt        cns mets   COPD Maternal Grandmother    Heart disease Paternal Grandmother        died 36   Diabetes Paternal Grandmother    Stroke Paternal Grandfather        in late 86s   Heart attack Brother 76   Colon cancer Neg Hx     Allergies  Allergen Reactions   Achromycin [Tetracycline Hcl] Shortness Of Breath    Dyspnea     Penicillins     Facial angioedema Did it involve swelling of the face/tongue/throat, SOB, or low BP? Yes Did it involve sudden or severe rash/hives, skin peeling, or any reaction on the inside of your mouth or nose? No Did you need to seek medical attention at a hospital or doctor's office? No When did it last happen?      30+ years If all above answers are "NO", may proceed  with cephalosporin use.    Sulfamethoxazole-Trimethoprim Hives   Nickel Itching   Zanaflex [Tizanidine Hydrochloride]     Causes more pain     Current Outpatient Medications on File Prior to Visit  Medication Sig Dispense Refill   ALPRAZolam (XANAX) 1 MG tablet TAKE 1 TABLET BY MOUTH THREE TIMES A DAY AS NEEDED FOR ANXIETY 90 tablet 0   amiodarone (PACERONE) 200 MG tablet Take 1 tablet (200 mg total) by mouth daily. 90 tablet 3   atorvastatin (LIPITOR) 40 MG tablet TAKE 1 TABLET BY MOUTH EVERY DAY 90 tablet 3   Calcium Carb-Cholecalciferol (CALCIUM 600 + D PO) Take 1 tablet by mouth daily.     cholecalciferol (VITAMIN D3) 25 MCG (1000 UNIT) tablet Take 1,000 Units by mouth once a week. For 12 weeks     clindamycin (CLEOCIN T) 1 % lotion Apply 1 application  topically 2 (two) times daily as needed (rosacea).     clobetasol (TEMOVATE) 0.05 % external solution Apply 1 application topically daily as needed (rosacea).     cyclobenzaprine (FLEXERIL) 5 MG tablet Take 5 mg by mouth at bedtime as needed for muscle spasms.     ELIQUIS 5 MG TABS tablet TAKE 1 TABLET BY MOUTH TWICE A DAY 180 tablet 3   fluocinonide (LIDEX) 0.05 % external solution Apply 1 application topically daily as needed (rosacea).     FLUoxetine (PROZAC) 40 MG capsule TAKE 1 CAPSULE BY MOUTH EVERY DAY 90 capsule 1   furosemide (LASIX) 40 MG tablet Take 1 tablet (40 mg total) by mouth daily. 90 tablet 1   gabapentin (NEURONTIN) 300 MG capsule Take 1 capsule (300 mg total) by mouth at bedtime. 90 capsule 1   HYDROcodone-acetaminophen (NORCO/VICODIN) 5-325 MG tablet Take 1 tablet by mouth every 6 (six) hours as needed for moderate pain. 30 tablet 0   metroNIDAZOLE (METROCREAM) 0.75 % cream Apply 1 application topically 2 (two) times daily as needed (rosacea).  Multiple Vitamin (MULTIVITAMIN WITH MINERALS) TABS tablet Take 1 tablet by mouth daily. Centrum Silver     omeprazole (PRILOSEC) 40 MG capsule TAKE 1 CAPSULE BY MOUTH  EVERY DAY 90 capsule 1   potassium chloride (KLOR-CON) 10 MEQ tablet Take 2 tablets (20 mEq total) by mouth daily. 180 tablet 3   sacubitril-valsartan (ENTRESTO) 49-51 MG Take 1 tablet by mouth 2 (two) times daily. 60 tablet 11   nitroGLYCERIN (NITROSTAT) 0.4 MG SL tablet Place 1 tablet (0.4 mg total) under the tongue every 5 (five) minutes as needed for chest pain. 90 tablet 3   No current facility-administered medications on file prior to visit.    BP 110/60   Temp 98.1 F (36.7 C) (Oral)   Ht 5\' 6"  (1.676 m)   Wt 279 lb (126.6 kg)   BMI 45.03 kg/m       Objective:   Physical Exam Vitals and nursing note reviewed.  Constitutional:      Appearance: She is well-developed.  Cardiovascular:     Rate and Rhythm: Normal rate and regular rhythm.     Pulses: Normal pulses.     Heart sounds: Normal heart sounds.  Pulmonary:     Effort: Pulmonary effort is normal.     Breath sounds: Normal breath sounds.  Abdominal:     General: Abdomen is flat. Bowel sounds are normal.     Palpations: Abdomen is soft.     Tenderness: There is abdominal tenderness in the suprapubic area and left lower quadrant. There is no right CVA tenderness or left CVA tenderness.  Skin:    General: Skin is warm and dry.  Neurological:     General: No focal deficit present.     Mental Status: She is alert and oriented to person, place, and time.  Psychiatric:        Mood and Affect: Mood normal.        Behavior: Behavior normal.        Thought Content: Thought content normal.        Judgment: Judgment normal.        Assessment & Plan:  1. Left lower quadrant abdominal pain -Will treat for likely diverticulitis.  - ciprofloxacin (CIPRO) 500 MG tablet; Take 1 tablet (500 mg total) by mouth 2 (two) times daily for 10 days.  Dispense: 20 tablet; Refill: 0 - metroNIDAZOLE (FLAGYL) 500 MG tablet; Take 1 tablet (500 mg total) by mouth 3 (three) times daily for 10 days.  Dispense: 30 tablet; Refill: 0  2.  Chronic pain disorder  - cyclobenzaprine (FLEXERIL) 5 MG tablet; Take 1 tablet (5 mg total) by mouth at bedtime as needed for muscle spasms.  Dispense: 30 tablet; Refill: 0  Time spent with patient today was 32 minutes which consisted of chart review, discussing diverticulitis, chronic pain and grieving,  work up, treatment answering questions and documentation.

## 2023-04-27 NOTE — Telephone Encounter (Signed)
Pt calling for refill ALPRAZolam (XANAX) 1 MG tablet. Dealing with the recent death of her son. Saw Kandee Keen today

## 2023-04-30 ENCOUNTER — Telehealth: Payer: Self-pay | Admitting: Internal Medicine

## 2023-04-30 NOTE — Telephone Encounter (Signed)
Called patient left a voicemail again to return phone about med usage

## 2023-04-30 NOTE — Telephone Encounter (Signed)
Pt called, returning CMA's call. CMA was unavailable. Pt asked that CMA call back at her earliest convenience. 

## 2023-05-01 ENCOUNTER — Other Ambulatory Visit: Payer: Self-pay | Admitting: Internal Medicine

## 2023-05-01 DIAGNOSIS — Z1231 Encounter for screening mammogram for malignant neoplasm of breast: Secondary | ICD-10-CM

## 2023-05-14 NOTE — Telephone Encounter (Signed)
Spoke with patient and confirmed her order was sent to [facility] for new machine and supplies. Her previous DME was [facility] and no longer supply CPAP.

## 2023-05-18 ENCOUNTER — Ambulatory Visit: Admission: RE | Admit: 2023-05-18 | Payer: Medicare Other | Source: Ambulatory Visit

## 2023-05-18 ENCOUNTER — Ambulatory Visit (HOSPITAL_COMMUNITY): Admission: RE | Admit: 2023-05-18 | Payer: Medicare Other | Source: Ambulatory Visit | Admitting: Cardiology

## 2023-05-18 ENCOUNTER — Encounter (HOSPITAL_COMMUNITY): Payer: Self-pay | Admitting: Cardiology

## 2023-05-18 VITALS — BP 120/64 | HR 72 | Wt 291.4 lb

## 2023-05-18 DIAGNOSIS — Z7901 Long term (current) use of anticoagulants: Secondary | ICD-10-CM | POA: Insufficient documentation

## 2023-05-18 DIAGNOSIS — Z79899 Other long term (current) drug therapy: Secondary | ICD-10-CM | POA: Diagnosis not present

## 2023-05-18 DIAGNOSIS — I5032 Chronic diastolic (congestive) heart failure: Secondary | ICD-10-CM | POA: Diagnosis not present

## 2023-05-18 DIAGNOSIS — I50810 Right heart failure, unspecified: Secondary | ICD-10-CM

## 2023-05-18 DIAGNOSIS — I110 Hypertensive heart disease with heart failure: Secondary | ICD-10-CM | POA: Diagnosis not present

## 2023-05-18 DIAGNOSIS — I480 Paroxysmal atrial fibrillation: Secondary | ICD-10-CM | POA: Diagnosis not present

## 2023-05-18 DIAGNOSIS — I2510 Atherosclerotic heart disease of native coronary artery without angina pectoris: Secondary | ICD-10-CM | POA: Diagnosis not present

## 2023-05-18 DIAGNOSIS — E785 Hyperlipidemia, unspecified: Secondary | ICD-10-CM | POA: Diagnosis not present

## 2023-05-18 DIAGNOSIS — Z1231 Encounter for screening mammogram for malignant neoplasm of breast: Secondary | ICD-10-CM

## 2023-05-18 DIAGNOSIS — K580 Irritable bowel syndrome with diarrhea: Secondary | ICD-10-CM | POA: Insufficient documentation

## 2023-05-18 DIAGNOSIS — I272 Pulmonary hypertension, unspecified: Secondary | ICD-10-CM | POA: Diagnosis not present

## 2023-05-18 DIAGNOSIS — Z8249 Family history of ischemic heart disease and other diseases of the circulatory system: Secondary | ICD-10-CM | POA: Insufficient documentation

## 2023-05-18 DIAGNOSIS — G4733 Obstructive sleep apnea (adult) (pediatric): Secondary | ICD-10-CM

## 2023-05-18 LAB — BASIC METABOLIC PANEL
Anion gap: 11 (ref 5–15)
BUN: 29 mg/dL — ABNORMAL HIGH (ref 8–23)
CO2: 25 mmol/L (ref 22–32)
Calcium: 8.6 mg/dL — ABNORMAL LOW (ref 8.9–10.3)
Chloride: 98 mmol/L (ref 98–111)
Creatinine, Ser: 1.41 mg/dL — ABNORMAL HIGH (ref 0.44–1.00)
GFR, Estimated: 40 mL/min — ABNORMAL LOW (ref >60)
Glucose, Bld: 94 mg/dL (ref 70–99)
Potassium: 4.5 mmol/L (ref 3.5–5.1)
Sodium: 134 mmol/L — ABNORMAL LOW (ref 135–145)

## 2023-05-18 LAB — BRAIN NATRIURETIC PEPTIDE: B Natriuretic Peptide: 181.6 pg/mL — ABNORMAL HIGH (ref 0.0–100.0)

## 2023-05-18 NOTE — Progress Notes (Signed)
ReDS Vest / Clip - 05/18/23 1300       ReDS Vest / Clip   Station Marker C    Ruler Value 34    ReDS Value Range High volume overload    ReDS Actual Value 41

## 2023-05-18 NOTE — Progress Notes (Signed)
ADVANCED HEART FAILURE CLINIC NOTE  Referring Physician: Philip Aspen, Almira Bar*  Primary Care: Philip Aspen, Limmie Patricia, MD Primary Cardiologist: Dr. Rennis Golden  HF: Dr. Gasper Lloyd  HPI: Kathy [name] is a 72 y.o. female with HFpEF, paroxysmal atrial fibrillation, OSA (CPAP), hyperlipidemia and HTN presenting today for evaluation. Ms. Henkelman cardiac history dates back to 2019 when she presented to the ER  w/ complaints of dyspnea. She was found to have atrial fibrillation  w/ TTE demonstrating preserved LV function and severe LVH. She converted to NSR with metoprolol. Since that time she has also underwent LHC w/ nonobstructive CAD. Over the past 2 years she has continued to struggle with symptomatic paroxysmal atrial fibrillation. In 11/23 she was admitted with acute on chronic HFpEF; TTE at that time with dilated RV w/ moderately reduced systolic function. She was diuresed and discharged home. Since that time she has her functional status remains fairly limited.  She can perform all ADLs independently but frequently becomes very fatigued and short of breath after walking over 30 to 40 feet.  No PND and currently lower extremity edema is fairly well-controlled however she does quickly become hypervolemic with any dietary indiscretion. Ms. Drumgoole has undergone RHC confirming RV dysfunction with reduced cardiac output/index likely secondary to long term OSA and severe TR. In addition, she has had afib for a prolonged period; underwent TEE/DCCV with conversion to sinus rhythm. cMRI in April 2024 demonstrated preserved LV function, severely dilated RV with RVEF 58%, D-shaped interventricular septum suggesting degree of RV pressure/volume overload, moderate to severe TR.  Interval hx:  -From a cardiac standpoint Ms. [name] is doing fairly well.  She continues to lose weight on a monthly basis.  Unfortunately her son passed unexpectedly from cancer and she is suffering from her loss.  She plans on  going on a prolonged trip to the beach this weekend and presents today to discuss medications, p.o. diuretics and any other changes we may need to make before she comes back in 2 to 3 months.   Past Medical History:  Diagnosis Date   Adenomatous colon polyp    2012   Anxiety and depression    Arthritis    Osteoarthritis, Dr Despina Hick   Cancer [facility])    skin - left foot excised   Chronic diastolic CHF (congestive heart failure) (HCC)    DDD (degenerative disc disease), cervical    Deaf, right    DVT (deep venous thrombosis) (HCC)    RLE DVT 08/2004, post right knee arthroscopy   Fibromyalgia    Dr Azzie Roup, Rheu   GERD (gastroesophageal reflux disease)    H/O hiatal hernia    Headache(784.0)    PAST HX MILD MIGRAINES - resolved per patient 08/18/20   History of kidney stones    passed stones   HTN (hypertension)    IBS (irritable bowel syndrome)    Internal hemorrhoid    Lumbar radiculopathy    Obesity    OSA on CPAP    Moderate with AHI 20/hr with nocturnal hypoxemia on CPAP   PAF (paroxysmal atrial fibrillation) (HCC) 06/2018   Pulmonary hypertension (HCC)     Current Outpatient Medications  Medication Sig Dispense Refill   ALPRAZolam (XANAX) 1 MG tablet TAKE 1 TABLET BY MOUTH THREE TIMES A DAY AS NEEDED FOR ANXIETY 90 tablet 0   amiodarone (PACERONE) 200 MG tablet Take 1 tablet (200 mg total) by mouth daily. 90 tablet 3   atorvastatin (LIPITOR) 40 MG tablet TAKE 1  TABLET BY MOUTH EVERY DAY 90 tablet 3   Calcium Carb-Cholecalciferol (CALCIUM 600 + D PO) Take 1 tablet by mouth daily.     cholecalciferol (VITAMIN D3) 25 MCG (1000 UNIT) tablet Take 1,000 Units by mouth once a week. For 12 weeks     clindamycin (CLEOCIN T) 1 % lotion Apply 1 application  topically 2 (two) times daily as needed (rosacea).     clobetasol (TEMOVATE) 0.05 % external solution Apply 1 application topically daily as needed (rosacea).     cyclobenzaprine (FLEXERIL) 5 MG tablet Take 1 tablet (5 mg  total) by mouth at bedtime as needed for muscle spasms. 30 tablet 0   ELIQUIS 5 MG TABS tablet TAKE 1 TABLET BY MOUTH TWICE A DAY 180 tablet 3   fluocinonide (LIDEX) 0.05 % external solution Apply 1 application topically daily as needed (rosacea).     FLUoxetine (PROZAC) 40 MG capsule TAKE 1 CAPSULE BY MOUTH EVERY DAY 90 capsule 1   furosemide (LASIX) 40 MG tablet Take 1 tablet (40 mg total) by mouth daily. 90 tablet 1   gabapentin (NEURONTIN) 300 MG capsule Take 1 capsule (300 mg total) by mouth at bedtime. 90 capsule 1   HYDROcodone-acetaminophen (NORCO/VICODIN) 5-325 MG tablet Take 1 tablet by mouth every 6 (six) hours as needed for moderate pain. 30 tablet 0   metroNIDAZOLE (METROCREAM) 0.75 % cream Apply 1 application topically 2 (two) times daily as needed (rosacea).      Multiple Vitamin (MULTIVITAMIN WITH MINERALS) TABS tablet Take 1 tablet by mouth daily. Centrum Silver     nitroGLYCERIN (NITROSTAT) 0.4 MG SL tablet Place 1 tablet (0.4 mg total) under the tongue every 5 (five) minutes as needed for chest pain. 90 tablet 3   omeprazole (PRILOSEC) 40 MG capsule TAKE 1 CAPSULE BY MOUTH EVERY DAY 90 capsule 1   potassium chloride (KLOR-CON) 10 MEQ tablet Take 2 tablets (20 mEq total) by mouth daily. 180 tablet 3   sacubitril-valsartan (ENTRESTO) 49-51 MG Take 1 tablet by mouth 2 (two) times daily. 60 tablet 11   No current facility-administered medications for this encounter.    Allergies  Allergen Reactions   Achromycin [Tetracycline Hcl] Shortness Of Breath    Dyspnea     Penicillins     Facial angioedema Did it involve swelling of the face/tongue/throat, SOB, or low BP? Yes Did it involve sudden or severe rash/hives, skin peeling, or any reaction on the inside of your mouth or nose? No Did you need to seek medical attention at a hospital or doctor's office? No When did it last happen?      30+ years If all above answers are "NO", may proceed with cephalosporin use.     Sulfamethoxazole-Trimethoprim Hives   Nickel Itching   Zanaflex [Tizanidine Hydrochloride]     Causes more pain       Social History   Socioeconomic History   Marital status: Widowed    Spouse name: Not on file   Number of children: 2   Years of education: Not on file   Highest education level: 12th grade  Occupational History   Occupation: retired  Tobacco Use   Smoking status: Never   Smokeless tobacco: Never  Vaping Use   Vaping status: Never Used  Substance and Sexual Activity   Alcohol use: Yes    Comment: occasional   Drug use: No   Sexual activity: Not on file    Comment: Hysterectomy  Other Topics Concern   Not on  file  Social History Narrative   0 caffeine drinks, likes to read, crotchet, spend time with family & friends, very social   Social Determinants of Health   Financial Resource Strain: Low Risk  (12/27/2022)   Overall Financial Resource Strain (CARDIA)    Difficulty of Paying Living Expenses: Not hard at all  Food Insecurity: No Food Insecurity (12/28/2022)   Hunger Vital Sign    Worried About Running Out of Food in the Last Year: Never true    Ran Out of Food in the Last Year: Never true  Transportation Needs: No Transportation Needs (12/27/2022)   PRAPARE - Administrator, Civil Service (Medical): No    Lack of Transportation (Non-Medical): No  Physical Activity: Insufficiently Active (12/27/2022)   Exercise Vital Sign    Days of Exercise per Week: 2 days    Minutes of Exercise per Session: 30 min  Stress: No Stress Concern Present (12/27/2022)   [employer] of Occupational Health - Occupational Stress Questionnaire    Feeling of Stress : Only a little  Social Connections: Moderately Integrated (12/27/2022)   Social Connection and Isolation Panel [NHANES]    Frequency of Communication with Friends and Family: More than three times a week    Frequency of Social Gatherings with Friends and Family: More than three times a week     Attends Religious Services: 1 to 4 times per year    Active Member of [employer] or Organizations: Yes    Attends Banker Meetings: 1 to 4 times per year    Marital Status: Widowed  Recent Concern: Social Connections - Moderately Isolated (11/15/2022)   Social Connection and Isolation Panel [NHANES]    Frequency of Communication with Friends and Family: More than three times a week    Frequency of Social Gatherings with Friends and Family: Three times a week    Attends Religious Services: More than 4 times per year    Active Member of Clubs or Organizations: No    Attends Banker Meetings: Never    Marital Status: Widowed  Intimate Partner Violence: Not At Risk (11/15/2022)   Humiliation, Afraid, Rape, and Kick questionnaire    Fear of Current or Ex-Partner: No    Emotionally Abused: No    Physically Abused: No    Sexually Abused: No      Family History  Problem Relation Age of Onset   Heart attack Mother 37   COPD Mother    Breast cancer Mother 68   Hypertension Father    Diabetes Father    Heart failure Father 31   Breast cancer Paternal Aunt        cns mets   COPD Maternal Grandmother    Heart disease Paternal Grandmother        died 25   Diabetes Paternal Grandmother    Stroke Paternal Grandfather        in late 35s   Heart attack Brother 60   Colon cancer Neg Hx     PHYSICAL EXAM: Vitals:   05/18/23 1346  BP: 120/64  Pulse: 72  SpO2: 92%   GENERAL: Well nourished, well developed, and in no apparent distress at rest.  HEENT: Negative for arcus senilis or xanthelasma. There is no scleral icterus.  The mucous membranes are pink and moist.   NECK: Supple, No masses. Normal carotid upstrokes without bruits. No masses or thyromegaly.    CHEST: There are no chest wall deformities. There is no chest  wall tenderness. Respirations are unlabored.  Lungs- CTA B/L CARDIAC:  JVP: 8 cm          Normal rate with regular rhythm. No murmurs, rubs or  gallops.  Pulses are 2+ and symmetrical in upper and lower extremities. 1+ edema.  ABDOMEN: Soft, non-tender, non-distended. There are no masses or hepatomegaly. There are normal bowel sounds.  EXTREMITIES: Warm and well perfused with no cyanosis, clubbing.  LYMPHATIC: No axillary or supraclavicular lymphadenopathy.  NEUROLOGIC: Patient is oriented x3 with no focal or lateralizing neurologic deficits.  PSYCH: Patients affect is appropriate, there is no evidence of anxiety or depression.  SKIN: Warm and dry; no lesions or wounds.    DATA REVIEW  ECG: NSR 61 bpm  cMRI 12/25/22: LVEF 69%, RVEF 58%, RV severely dilated, D-shaped IVS suggestive of RV pressure/volume overload, nonspecific LGE at basal to mid inferoseptal RV insertion site, moderate to severe TR with regurgitant fraction of 36% and regurgitant volume of 65 cc  ECHO: 07/27/22: LVEF 50-55%, RV is dilated with mild to moderately reduced systolic function. RA is severely dilated. There is severe eccentric TR.    CATH: 06/26/18:  Ost RCA lesion is 30% stenosed. The left ventricular systolic function is normal. LV end diastolic pressure is mildly elevated. The left ventricular ejection fraction is 55-65% by visual estimate.   10/10/22: RA:                  13 mmHg (mean) RV:                  41/10-15 mmHg PA:                  40/15 mmHg (21 mean) PCWP:            14 mmHg (mean)                                      Estimated Fick CO/CI   4.39 L/min, 1.9 L/min/m2 Thermodilution CO/CI   3.61 L/min, 1.5 L/min/m2                                            TPG                 7  mmHg                                              PVR                 1.6-1.94 Wood Units  PAPi                1.9       IMPRESSION: Severely elevated pre-capillary pressures.  Normal left sided filling pressures Normal PVR and PA mean.  Severely reduced cardiac index likely secondary to suboptimal RV function. (ZOX:WRUE of 1, PAPi of 1.9.  )   ASSESSMENT & PLAN:  RV Failure/Severe TR - Echocardiograms dating back to 2019 demonstrate severe TR. On her initial TTE in 2019 she has severe TR with mildly dilated RV and preserved LV function. Over the past 3 years, echocardiograms demonstrate gradual progression of TR and worsening of  RV function.  Uncertain if this is primary TR or a sequale of underlying pulmonary hypertension.  - RHC/TEE on 10/10/22 confirming RV failure with severe TR. Likely has had prolonged atrial fibrillation and OSA.  - Cardiac MRI 04/24 with RVEF 58%, severely dilated RV and evidence of moderate to severe TR. - Initially noted significant improvement in functional status with cardioversion from Afib >> sinus rhythm. More shortness of breath last few days. See below. - Will consider triclip in the future  2. Heart failure w/ preserved EF  - Most recent TTE w/ indeterminate diastolic function; tissue doppler not consistent with significant diastolic dysfunction.  - NYHA IIB symptoms - Continue Entresto 49/51 mg daily -Significant improvement in volume status over the past several appointments.  She is mildly hypervolemic today.  Will increase Lasix to 40 mg twice daily for the next 3 days then back to 40 mg daily.  Recheck BMP/BNP today.  Reds elevated at 42%.  3. Atrial fibrillation  - Continue Apixaban 5mg  BID - Now s/p TEE/DCCV in January 2024. - Maintaining SR - Continue Amiodarone 200mg  BID. Check TSH and LFTs  4. OSA  - AHI 20/hr with O2 sats as low as 79%.  - Now tolerating CPAP well.   5. Diarrhea -Resolved   Conner Muegge Advanced Heart Failure Mechanical Circulatory Support

## 2023-05-18 NOTE — Patient Instructions (Addendum)
Good to see you today!   Increase Lasix 40 mg 2 x a day for 3  days then back to 40 mg daily  Labs done today, your results will be available in MyChart, we will contact you for abnormal readings.  Your physician recommends that you schedule a follow-up appointment in: 2 months as scheduled  If you have any questions or concerns before your next appointment please send [name] a message through [place] or call our office at [phone].    TO LEAVE A MESSAGE FOR THE NURSE SELECT OPTION 2, PLEASE LEAVE A MESSAGE INCLUDING: YOUR NAME DATE OF BIRTH CALL BACK NUMBER REASON FOR CALL**this is important as we prioritize the call backs  YOU WILL RECEIVE A CALL BACK THE SAME DAY AS LONG AS YOU CALL BEFORE 4:00 PM  At the [facility], you and your health needs are our priority. As part of our continuing mission to provide you with exceptional heart care, we have created designated Provider Care Teams. These Care Teams include your primary Cardiologist (physician) and Advanced Practice Providers (APPs- Physician Assistants and Nurse Practitioners) who all work together to provide you with the care you need, when you need it.   You may see any of the following providers on your designated Care Team at your next follow up: Dr Arvilla Meres Dr Marca Ancona Dr. Marcos Eke, NP Robbie Lis, [place] [facility], [place] Brynda Peon, NP Karle Plumber, PharmD   Please be sure to bring in all your medications bottles to every appointment.    Thank you for choosing [facility]

## 2023-06-01 ENCOUNTER — Other Ambulatory Visit: Payer: Self-pay | Admitting: Internal Medicine

## 2023-06-01 DIAGNOSIS — F419 Anxiety disorder, unspecified: Secondary | ICD-10-CM

## 2023-06-01 DIAGNOSIS — G894 Chronic pain syndrome: Secondary | ICD-10-CM

## 2023-06-01 NOTE — Telephone Encounter (Signed)
Pt calling to check to see if anyone would be able to fill this today. She lost her son 6 weeks ago and is not doing well.

## 2023-06-01 NOTE — Telephone Encounter (Signed)
Prescription Request  06/01/2023  LOV: 03/28/2023  What is the name of the medication or equipment? ALPRAZolam (XANAX) 1 MG tablet , cyclobenzaprine (FLEXERIL) 5 MG tablet   Have you contacted your pharmacy to request a refill? Yes   Which pharmacy would you like this sent to?  [id number] [place], [place] - [phone] AT [address] Phone: [phone]  Fax: [phone]       Patient notified that their request is being sent to the clinical staff for review and that they should receive a response within 2 business days.   Please advise at Mobile [phone] (mobile)

## 2023-06-04 MED ORDER — CYCLOBENZAPRINE HCL 5 MG PO TABS
5.0000 mg | ORAL_TABLET | Freq: Every evening | ORAL | 0 refills | Status: DC | PRN
Start: 2023-06-04 — End: 2023-06-04

## 2023-06-04 MED ORDER — ALPRAZOLAM 1 MG PO TABS
1.0000 mg | ORAL_TABLET | Freq: Three times a day (TID) | ORAL | 0 refills | Status: DC | PRN
Start: 2023-06-04 — End: 2023-06-04

## 2023-06-04 MED ORDER — ALPRAZOLAM 1 MG PO TABS
1.0000 mg | ORAL_TABLET | Freq: Three times a day (TID) | ORAL | 0 refills | Status: DC | PRN
Start: 2023-06-04 — End: 2023-08-07

## 2023-06-04 MED ORDER — CYCLOBENZAPRINE HCL 5 MG PO TABS
5.0000 mg | ORAL_TABLET | Freq: Every evening | ORAL | 0 refills | Status: DC | PRN
Start: 2023-06-04 — End: 2023-06-11

## 2023-06-04 NOTE — Telephone Encounter (Signed)
Pt is aware can take up to 3 business days she is at beach and the wind is getting up

## 2023-06-04 NOTE — Telephone Encounter (Signed)
Refill sent.

## 2023-06-04 NOTE — Addendum Note (Signed)
Addended by: Kern Reap B on: 06/04/2023 11:17 AM   Modules accepted: Orders

## 2023-06-06 ENCOUNTER — Other Ambulatory Visit: Payer: Self-pay | Admitting: Internal Medicine

## 2023-06-06 DIAGNOSIS — M797 Fibromyalgia: Secondary | ICD-10-CM

## 2023-06-06 NOTE — Telephone Encounter (Signed)
Prescription Request  06/06/2023  LOV: 03/28/2023  What is the name of the medication or equipment? gabapentin gabapentin (NEURONTIN) 300 MG capsule  Have you contacted your pharmacy to request a refill? No   Pt called to say she is still at the beach, and only has a few pills left. [facility] [place] [place]  Which pharmacy would you like this sent to?   [id number] [place], [place] - [phone] AT [facility] [address] [place] [address] [address] Phone: [phone] Fax: [phone]  Patient notified that their request is being sent to the clinical staff for review and that they should receive a response within 2 business days.   Please advise at Mobile [phone] (mobile)

## 2023-06-07 MED ORDER — GABAPENTIN 300 MG PO CAPS
300.0000 mg | ORAL_CAPSULE | Freq: Every day | ORAL | 1 refills | Status: DC
Start: 2023-06-07 — End: 2023-11-27

## 2023-06-07 NOTE — Telephone Encounter (Signed)
Refill sent.

## 2023-06-11 ENCOUNTER — Other Ambulatory Visit: Payer: Self-pay | Admitting: Internal Medicine

## 2023-06-11 DIAGNOSIS — G894 Chronic pain syndrome: Secondary | ICD-10-CM

## 2023-06-20 NOTE — Telephone Encounter (Addendum)
Pt says she doesn't recall picking this up. Had a fall on 06/08/23 while at the beach and asking that cyclobenzaprine (FLEXERIL) 5 MG tablet be redirected to  [id number] [place], [place] - [address] [name] [address] AT [name] Phone: [phone]  Fax: [phone]    Aware that OOO and asks that another provider assist

## 2023-06-21 ENCOUNTER — Telehealth (HOSPITAL_COMMUNITY): Payer: Self-pay | Admitting: Cardiology

## 2023-06-21 MED ORDER — FUROSEMIDE 40 MG PO TABS: 40.0000 mg | ORAL_TABLET | Freq: Every day | ORAL | 1 refills | Status: DC

## 2023-06-21 NOTE — Telephone Encounter (Signed)
Refill requested

## 2023-06-24 ENCOUNTER — Other Ambulatory Visit: Payer: Self-pay | Admitting: Internal Medicine

## 2023-06-24 DIAGNOSIS — F419 Anxiety disorder, unspecified: Secondary | ICD-10-CM

## 2023-06-25 ENCOUNTER — Emergency Department (HOSPITAL_BASED_OUTPATIENT_CLINIC_OR_DEPARTMENT_OTHER): Payer: Medicare Other

## 2023-06-25 ENCOUNTER — Encounter (HOSPITAL_BASED_OUTPATIENT_CLINIC_OR_DEPARTMENT_OTHER): Payer: Self-pay | Admitting: Emergency Medicine

## 2023-06-25 ENCOUNTER — Emergency Department (HOSPITAL_BASED_OUTPATIENT_CLINIC_OR_DEPARTMENT_OTHER)
Admission: EM | Admit: 2023-06-25 | Discharge: 2023-06-25 | Disposition: A | Discharge status: Home / Self Care | Payer: Medicare Other | Attending: Emergency Medicine | Admitting: Emergency Medicine

## 2023-06-25 ENCOUNTER — Emergency Department (HOSPITAL_BASED_OUTPATIENT_CLINIC_OR_DEPARTMENT_OTHER): Payer: Medicare Other | Admitting: Radiology

## 2023-06-25 ENCOUNTER — Telehealth (HOSPITAL_COMMUNITY): Payer: Self-pay | Admitting: Cardiology

## 2023-06-25 ENCOUNTER — Other Ambulatory Visit: Payer: Self-pay

## 2023-06-25 DIAGNOSIS — S32040A Wedge compression fracture of fourth lumbar vertebra, initial encounter for closed fracture: Secondary | ICD-10-CM

## 2023-06-25 DIAGNOSIS — M25552 Pain in left hip: Secondary | ICD-10-CM | POA: Diagnosis not present

## 2023-06-25 DIAGNOSIS — M545 Low back pain, unspecified: Secondary | ICD-10-CM | POA: Insufficient documentation

## 2023-06-25 DIAGNOSIS — R1032 Left lower quadrant pain: Secondary | ICD-10-CM | POA: Insufficient documentation

## 2023-06-25 DIAGNOSIS — R197 Diarrhea, unspecified: Secondary | ICD-10-CM | POA: Insufficient documentation

## 2023-06-25 DIAGNOSIS — Z7901 Long term (current) use of anticoagulants: Secondary | ICD-10-CM | POA: Insufficient documentation

## 2023-06-25 DIAGNOSIS — M25551 Pain in right hip: Secondary | ICD-10-CM | POA: Diagnosis not present

## 2023-06-25 DIAGNOSIS — R1031 Right lower quadrant pain: Secondary | ICD-10-CM | POA: Diagnosis not present

## 2023-06-25 LAB — COMPREHENSIVE METABOLIC PANEL
ALT: 28 U/L (ref 0–44)
AST: 37 U/L (ref 15–41)
Albumin: 4.5 g/dL (ref 3.5–5.0)
Alkaline Phosphatase: 104 U/L (ref 38–126)
Anion gap: 9 (ref 5–15)
BUN: 21 mg/dL (ref 8–23)
CO2: 30 mmol/L (ref 22–32)
Calcium: 9.8 mg/dL (ref 8.9–10.3)
Chloride: 98 mmol/L (ref 98–111)
Creatinine, Ser: 1.24 mg/dL — ABNORMAL HIGH (ref 0.44–1.00)
GFR, Estimated: 47 mL/min — ABNORMAL LOW (ref >60)
Glucose, Bld: 96 mg/dL (ref 70–99)
Potassium: 3.7 mmol/L (ref 3.5–5.1)
Sodium: 137 mmol/L (ref 135–145)
Total Bilirubin: 0.6 mg/dL (ref 0.3–1.2)
Total Protein: 7.1 g/dL (ref 6.5–8.1)

## 2023-06-25 LAB — CBC WITH DIFFERENTIAL/PLATELET
Abs Immature Granulocytes: 0.02 10*3/uL (ref 0.00–0.07)
Basophils Absolute: 0 10*3/uL (ref 0.0–0.1)
Basophils Relative: 0 %
Eosinophils Absolute: 0.1 10*3/uL (ref 0.0–0.5)
Eosinophils Relative: 3 %
HCT: 38.6 % (ref 36.0–46.0)
Hemoglobin: 11.9 g/dL — ABNORMAL LOW (ref 12.0–15.0)
Immature Granulocytes: 0 %
Lymphocytes Relative: 20 %
Lymphs Abs: 0.9 10*3/uL (ref 0.7–4.0)
MCH: 25.9 pg — ABNORMAL LOW (ref 26.0–34.0)
MCHC: 30.8 g/dL (ref 30.0–36.0)
MCV: 84.1 fL (ref 80.0–100.0)
Monocytes Absolute: 0.3 10*3/uL (ref 0.1–1.0)
Monocytes Relative: 7 %
Neutro Abs: 3.3 10*3/uL (ref 1.7–7.7)
Neutrophils Relative %: 70 %
Platelets: 251 10*3/uL (ref 150–400)
RBC: 4.59 MIL/uL (ref 3.87–5.11)
RDW: 16.9 % — ABNORMAL HIGH (ref 11.5–15.5)
WBC: 4.6 10*3/uL (ref 4.0–10.5)
nRBC: 0 % (ref 0.0–0.2)

## 2023-06-25 LAB — URINALYSIS, ROUTINE W REFLEX MICROSCOPIC
Bacteria, UA: NONE SEEN
Bilirubin Urine: NEGATIVE
Glucose, UA: NEGATIVE mg/dL
Hgb urine dipstick: NEGATIVE
Ketones, ur: NEGATIVE mg/dL
Nitrite: NEGATIVE
Protein, ur: NEGATIVE mg/dL
Specific Gravity, Urine: 1.01 (ref 1.005–1.030)
pH: 5 (ref 5.0–8.0)

## 2023-06-25 MED ORDER — IOHEXOL 300 MG/ML  SOLN
100.0000 mL | Freq: Once | INTRAMUSCULAR | Status: AC | PRN
  Administered 2023-06-25: 100 mL via INTRAVENOUS

## 2023-06-25 MED ORDER — TRAMADOL HCL 50 MG PO TABS
50.0000 mg | ORAL_TABLET | Freq: Three times a day (TID) | ORAL | 0 refills | Status: DC | PRN
Start: 2023-06-25 — End: 2023-07-31

## 2023-06-25 MED ORDER — TRAMADOL HCL 50 MG PO TABS
50.0000 mg | ORAL_TABLET | Freq: Once | ORAL | Status: AC
  Administered 2023-06-25: 50 mg via ORAL
  Filled 2023-06-25: qty 1

## 2023-06-25 MED ORDER — FENTANYL CITRATE PF 50 MCG/ML IJ SOSY
50.0000 ug | PREFILLED_SYRINGE | Freq: Once | INTRAMUSCULAR | Status: AC
  Administered 2023-06-25: 50 ug via INTRAVENOUS
  Filled 2023-06-25: qty 1

## 2023-06-25 MED ORDER — HYDROMORPHONE HCL 1 MG/ML IJ SOLN
1.0000 mg | Freq: Once | INTRAMUSCULAR | Status: AC
  Administered 2023-06-25: 1 mg via INTRAVENOUS
  Filled 2023-06-25: qty 1

## 2023-06-25 NOTE — Discharge Instructions (Signed)
You have been seen and discharged from the emergency department.  You were found to have a compression fracture of the L4 vertebrae.  It is recommended that you take 650 mg of Tylenol every 6 hours for the next couple days.  For breakthrough pain on top of that you may take the tramadol every 8 hours as needed.  In addition to this you can take the muscle relaxer this been previously prescribed to you however avoid taking the muscle relaxer and the tramadol at the same time due to oversedation.  In regards to tramadol: Do not mix this medication with alcohol or other sedating medications. Do not drive or do heavy physical activity until you know how this medication affects you.  It may cause drowsiness.  Call the spine specialist tomorrow to establish a follow-up for new compression fracture.  Follow-up with your primary provider for further evaluation and further care. Take home medications as prescribed. If you have any worsening symptoms or further concerns for your health please return to an emergency department for further evaluation.

## 2023-06-25 NOTE — ED Notes (Signed)
Report given to the next RN... 

## 2023-06-25 NOTE — ED Provider Notes (Signed)
[employer] EMERGENCY DEPARTMENT AT [facility] Provider Note   CSN: [id number] Arrival date & time: 06/25/23  1303     History  Chief Complaint  Patient presents with   Hip Pain    Kathy [name] is a 72 y.o. female.  HPI   72 year old female presents emergency department with bilateral hip pain, back pain and lower abdominal pain.  Patient states that she fell a couple weeks ago at the end of September.  She sustained a head injury and a right wrist fracture.  However over the last couple days she has developed worsening lower back pain and bilateral hip pain.  She is concerned that she could have had additional injuries that were not identified after her initial evaluation.  Patient denies any fever or chills.  Denies any genitourinary symptoms.  Endorses 1 episode of diarrhea but denies any other acute symptoms.  Home Medications Prior to Admission medications   Medication Sig Start Date End Date Taking? Authorizing Provider  ALPRAZolam Prudy Feeler) 1 MG tablet Take 1 tablet (1 mg total) by mouth 3 (three) times daily as needed for anxiety. 06/04/23   Philip Aspen, Limmie Patricia, MD  amiodarone (PACERONE) 200 MG tablet Take 1 tablet (200 mg total) by mouth daily. 04/12/23   Clegg, Amy D, NP  atorvastatin (LIPITOR) 40 MG tablet TAKE 1 TABLET BY MOUTH EVERY DAY 11/07/22   Nahser, Deloris Ping, MD  Calcium Carb-Cholecalciferol (CALCIUM 600 + D PO) Take 1 tablet by mouth daily.    [provider]  cholecalciferol (VITAMIN D3) 25 MCG (1000 UNIT) tablet Take 1,000 Units by mouth once a week. For 12 weeks    [provider]  clindamycin (CLEOCIN T) 1 % lotion Apply 1 application  topically 2 (two) times daily as needed (rosacea).    [provider]  clobetasol (TEMOVATE) 0.05 % external solution Apply 1 application topically daily as needed (rosacea).    [provider]  cyclobenzaprine (FLEXERIL) 5 MG tablet TAKE 1 TABLET BY MOUTH AT BEDTIME AS NEEDED  FOR MUSCLE SPASMS. 06/11/23   Philip Aspen, Limmie Patricia, MD  ELIQUIS 5 MG TABS tablet TAKE 1 TABLET BY MOUTH TWICE A DAY 11/30/22   Nahser, Deloris Ping, MD  fluocinonide (LIDEX) 0.05 % external solution Apply 1 application topically daily as needed (rosacea).    [provider]  FLUoxetine (PROZAC) 40 MG capsule TAKE 1 CAPSULE BY MOUTH EVERY DAY 06/25/23   Philip Aspen, Limmie Patricia, MD  furosemide (LASIX) 40 MG tablet Take 1 tablet (40 mg total) by mouth daily. 06/21/23   Sabharwal, Aditya, DO  gabapentin (NEURONTIN) 300 MG capsule Take 1 capsule (300 mg total) by mouth at bedtime. 06/07/23   Philip Aspen, Limmie Patricia, MD  HYDROcodone-acetaminophen (NORCO/VICODIN) 5-325 MG tablet Take 1 tablet by mouth every 6 (six) hours as needed for moderate pain. 06/06/22   Philip Aspen, Limmie Patricia, MD  metroNIDAZOLE (METROCREAM) 0.75 % cream Apply 1 application topically 2 (two) times daily as needed (rosacea).     [provider]  Multiple Vitamin (MULTIVITAMIN WITH MINERALS) TABS tablet Take 1 tablet by mouth daily. Centrum Silver    [provider]  nitroGLYCERIN (NITROSTAT) 0.4 MG SL tablet Place 1 tablet (0.4 mg total) under the tongue every 5 (five) minutes as needed for chest pain. 08/30/22 05/17/24  Gaston Islam., NP  omeprazole (PRILOSEC) 40 MG capsule TAKE 1 CAPSULE BY MOUTH EVERY DAY 03/07/23   Philip Aspen, Limmie Patricia, MD  potassium  chloride (KLOR-CON) 10 MEQ tablet Take 2 tablets (20 mEq total) by mouth daily. 04/03/23   Sabharwal, Aditya, DO  sacubitril-valsartan (ENTRESTO) 49-51 MG Take 1 tablet by mouth 2 (two) times daily. 11/17/22   Dorthula Nettles, DO      Allergies    Achromycin [tetracycline hcl], Penicillins, Sulfamethoxazole-trimethoprim, Nickel, Oxycodone, and Zanaflex [tizanidine hydrochloride]    Review of Systems   Review of Systems  Constitutional:  Negative for fever.  Respiratory:  Negative for shortness of breath.   Cardiovascular:  Negative for  chest pain.  Gastrointestinal:  Positive for diarrhea. Negative for abdominal distention, abdominal pain, nausea and vomiting.  Musculoskeletal:  Positive for back pain. Negative for neck pain.       + b/l hip pain  Skin:  Negative for rash.  Neurological:  Negative for headaches.    Physical Exam Updated Vital Signs BP (!) 168/67   Pulse (!) 53   Temp 97.9 F (36.6 C) (Oral)   Resp 16   Wt 128.4 kg   SpO2 97%   BMI 45.68 kg/m  Physical Exam Vitals and nursing note reviewed.  Constitutional:      General: She is not in acute distress.    Appearance: Normal appearance.  HENT:     Head: Normocephalic.     Comments: Old bruising to the right frontal forehead    Mouth/Throat:     Mouth: Mucous membranes are moist.  Eyes:     Pupils: Pupils are equal, round, and reactive to light.  Cardiovascular:     Rate and Rhythm: Normal rate.  Pulmonary:     Effort: Pulmonary effort is normal. No respiratory distress.  Abdominal:     General: Bowel sounds are normal. There is no distension.     Palpations: Abdomen is soft.     Comments: Mild tenderness to palpation of the bilateral lower quadrants  Musculoskeletal:     Cervical back: No rigidity.     Comments: Tenderness to palpation of the bilateral hips but pelvis stable.  Scattered tenderness along her entire thoracic and lumbar back, some midline  Skin:    General: Skin is warm.  Neurological:     Mental Status: She is alert and oriented to person, place, and time. Mental status is at baseline.  Psychiatric:        Mood and Affect: Mood normal.     ED Results / Procedures / Treatments   Labs (all labs ordered are listed, but only abnormal results are displayed) Labs Reviewed  CBC WITH DIFFERENTIAL/PLATELET - Abnormal; Notable for the following components:      Result Value   Hemoglobin 11.9 (*)    MCH 25.9 (*)    RDW 16.9 (*)    All other components within normal limits  COMPREHENSIVE METABOLIC PANEL - Abnormal;  Notable for the following components:   Creatinine, Ser 1.24 (*)    GFR, Estimated 47 (*)    All other components within normal limits  URINALYSIS, ROUTINE W REFLEX MICROSCOPIC - Abnormal; Notable for the following components:   Leukocytes,Ua SMALL (*)    All other components within normal limits    EKG None  Radiology CT ABDOMEN PELVIS W CONTRAST  Result Date: 06/25/2023 CLINICAL DATA:  Abdominal and back pain, recent fall EXAM: CT ABDOMEN AND PELVIS WITH CONTRAST CT THORACIC SPINE WITHOUT CONTRAST CT LUMBAR SPINE WITH CONTRAST TECHNIQUE: Multidetector CT imaging of the thoracic spine was performed using the standard protocol without administration of intravenous contrast. Multidetector CT imaging  of the lumbar spine was performed using the standard protocol following bolus administration of intravenous contrast. Multidetector CT imaging of the abdomen and pelvis was performed using the standard protocol following bolus administration of intravenous contrast. RADIATION DOSE REDUCTION: This exam was performed according to the departmental dose-optimization program which includes automated exposure control, adjustment of the mA and/or kV according to patient size and/or use of iterative reconstruction technique. CONTRAST:  OMNIPAQUE IOHEXOL 300 MG/ML  SOLN COMPARISON:  None Available. FINDINGS: CT ABDOMEN PELVIS FINDINGS Lower chest: No acute findings. Cardiomegaly. Coronary artery calcifications. Hepatobiliary: No focal liver abnormality is seen. Status post cholecystectomy. No biliary dilatation. Pancreas: Unremarkable. No pancreatic ductal dilatation or surrounding inflammatory changes. Spleen: Normal in size without significant abnormality. Adrenals/Urinary Tract: Adrenal glands are unremarkable. Simple, benign left renal cortical cysts, for which no further follow-up or characterization is required. Kidneys are otherwise normal, without renal calculi, solid lesion, or hydronephrosis. Bladder  is unremarkable. Stomach/Bowel: Stomach is within normal limits. Appendix appears normal. No evidence of bowel wall thickening, distention, or inflammatory changes. Descending and sigmoid diverticulosis. Vascular/Lymphatic: Aortic atherosclerosis. No enlarged abdominal or pelvic lymph nodes. Reproductive: Status post hysterectomy. Other: No abdominal wall hernia or abnormality. No ascites. Musculoskeletal: No acute osseous findings. Status post bilateral hip total arthroplasty. CT THORACIC AND LUMBAR SPINE FINDINGS Alignment: Normal thoracic kyphosis. Normal lumbar lordosis. Vertebral bodies: Osteopenia. Acute appearing, minimal superior endplate deformity of L4 vertebral body (series 6, image 53). Nonacute wedge deformity of L1 status post vertebral cement augmentation. No fracture or dislocation of the thoracic spine. Disc spaces: Disc degenerative disease and bridging osteophytosis throughout the mid to lower thoracic spine, in keeping with DISH (series 11, image 34). Paraspinous soft tissues: Unremarkable. IMPRESSION: 1. Acute appearing, minimal superior endplate deformity of the L4 vertebral body. 2. Nonacute wedge deformity of L1 status post vertebral cement augmentation. 3. No fracture or dislocation of the thoracic spine. 4. No CT evidence of acute intra-abdominal pathology. 5. Descending and sigmoid diverticulosis without evidence of acute diverticulitis. 6. Cardiomegaly and coronary artery disease. Aortic Atherosclerosis (ICD10-I70.0). Electronically Signed   By: Jearld Lesch M.D.   On: 06/25/2023 20:52   CT Thoracic Spine Wo Contrast  Result Date: 06/25/2023 CLINICAL DATA:  Abdominal and back pain, recent fall EXAM: CT ABDOMEN AND PELVIS WITH CONTRAST CT THORACIC SPINE WITHOUT CONTRAST CT LUMBAR SPINE WITH CONTRAST TECHNIQUE: Multidetector CT imaging of the thoracic spine was performed using the standard protocol without administration of intravenous contrast. Multidetector CT imaging of the lumbar  spine was performed using the standard protocol following bolus administration of intravenous contrast. Multidetector CT imaging of the abdomen and pelvis was performed using the standard protocol following bolus administration of intravenous contrast. RADIATION DOSE REDUCTION: This exam was performed according to the departmental dose-optimization program which includes automated exposure control, adjustment of the mA and/or kV according to patient size and/or use of iterative reconstruction technique. CONTRAST:  OMNIPAQUE IOHEXOL 300 MG/ML  SOLN COMPARISON:  None Available. FINDINGS: CT ABDOMEN PELVIS FINDINGS Lower chest: No acute findings. Cardiomegaly. Coronary artery calcifications. Hepatobiliary: No focal liver abnormality is seen. Status post cholecystectomy. No biliary dilatation. Pancreas: Unremarkable. No pancreatic ductal dilatation or surrounding inflammatory changes. Spleen: Normal in size without significant abnormality. Adrenals/Urinary Tract: Adrenal glands are unremarkable. Simple, benign left renal cortical cysts, for which no further follow-up or characterization is required. Kidneys are otherwise normal, without renal calculi, solid lesion, or hydronephrosis. Bladder is unremarkable. Stomach/Bowel: Stomach is within normal limits. Appendix appears normal.  No evidence of bowel wall thickening, distention, or inflammatory changes. Descending and sigmoid diverticulosis. Vascular/Lymphatic: Aortic atherosclerosis. No enlarged abdominal or pelvic lymph nodes. Reproductive: Status post hysterectomy. Other: No abdominal wall hernia or abnormality. No ascites. Musculoskeletal: No acute osseous findings. Status post bilateral hip total arthroplasty. CT THORACIC AND LUMBAR SPINE FINDINGS Alignment: Normal thoracic kyphosis. Normal lumbar lordosis. Vertebral bodies: Osteopenia. Acute appearing, minimal superior endplate deformity of L4 vertebral body (series 6, image 53). Nonacute wedge deformity of  L1 status post vertebral cement augmentation. No fracture or dislocation of the thoracic spine. Disc spaces: Disc degenerative disease and bridging osteophytosis throughout the mid to lower thoracic spine, in keeping with DISH (series 11, image 34). Paraspinous soft tissues: Unremarkable. IMPRESSION: 1. Acute appearing, minimal superior endplate deformity of the L4 vertebral body. 2. Nonacute wedge deformity of L1 status post vertebral cement augmentation. 3. No fracture or dislocation of the thoracic spine. 4. No CT evidence of acute intra-abdominal pathology. 5. Descending and sigmoid diverticulosis without evidence of acute diverticulitis. 6. Cardiomegaly and coronary artery disease. Aortic Atherosclerosis (ICD10-I70.0). Electronically Signed   By: Jearld Lesch M.D.   On: 06/25/2023 20:52   CT L-SPINE NO CHARGE  Result Date: 06/25/2023 CLINICAL DATA:  Abdominal and back pain, recent fall EXAM: CT ABDOMEN AND PELVIS WITH CONTRAST CT THORACIC SPINE WITHOUT CONTRAST CT LUMBAR SPINE WITH CONTRAST TECHNIQUE: Multidetector CT imaging of the thoracic spine was performed using the standard protocol without administration of intravenous contrast. Multidetector CT imaging of the lumbar spine was performed using the standard protocol following bolus administration of intravenous contrast. Multidetector CT imaging of the abdomen and pelvis was performed using the standard protocol following bolus administration of intravenous contrast. RADIATION DOSE REDUCTION: This exam was performed according to the departmental dose-optimization program which includes automated exposure control, adjustment of the mA and/or kV according to patient size and/or use of iterative reconstruction technique. CONTRAST:  OMNIPAQUE IOHEXOL 300 MG/ML  SOLN COMPARISON:  None Available. FINDINGS: CT ABDOMEN PELVIS FINDINGS Lower chest: No acute findings. Cardiomegaly. Coronary artery calcifications. Hepatobiliary: No focal liver abnormality  is seen. Status post cholecystectomy. No biliary dilatation. Pancreas: Unremarkable. No pancreatic ductal dilatation or surrounding inflammatory changes. Spleen: Normal in size without significant abnormality. Adrenals/Urinary Tract: Adrenal glands are unremarkable. Simple, benign left renal cortical cysts, for which no further follow-up or characterization is required. Kidneys are otherwise normal, without renal calculi, solid lesion, or hydronephrosis. Bladder is unremarkable. Stomach/Bowel: Stomach is within normal limits. Appendix appears normal. No evidence of bowel wall thickening, distention, or inflammatory changes. Descending and sigmoid diverticulosis. Vascular/Lymphatic: Aortic atherosclerosis. No enlarged abdominal or pelvic lymph nodes. Reproductive: Status post hysterectomy. Other: No abdominal wall hernia or abnormality. No ascites. Musculoskeletal: No acute osseous findings. Status post bilateral hip total arthroplasty. CT THORACIC AND LUMBAR SPINE FINDINGS Alignment: Normal thoracic kyphosis. Normal lumbar lordosis. Vertebral bodies: Osteopenia. Acute appearing, minimal superior endplate deformity of L4 vertebral body (series 6, image 53). Nonacute wedge deformity of L1 status post vertebral cement augmentation. No fracture or dislocation of the thoracic spine. Disc spaces: Disc degenerative disease and bridging osteophytosis throughout the mid to lower thoracic spine, in keeping with DISH (series 11, image 34). Paraspinous soft tissues: Unremarkable. IMPRESSION: 1. Acute appearing, minimal superior endplate deformity of the L4 vertebral body. 2. Nonacute wedge deformity of L1 status post vertebral cement augmentation. 3. No fracture or dislocation of the thoracic spine. 4. No CT evidence of acute intra-abdominal pathology. 5. Descending and sigmoid diverticulosis without evidence of  acute diverticulitis. 6. Cardiomegaly and coronary artery disease. Aortic Atherosclerosis (ICD10-I70.0).  Electronically Signed   By: Jearld Lesch M.D.   On: 06/25/2023 20:52   DG Hip Unilat W or Wo Pelvis 2-3 Views Right  Result Date: 06/25/2023 CLINICAL DATA:  Fall.  Pain. EXAM: DG HIP (WITH OR WITHOUT PELVIS) 2-3V RIGHT; DG HIP (WITH OR WITHOUT PELVIS) 2-3V LEFT COMPARISON:  Hip radiographs dated January 05, 2018. FINDINGS: There is diffuse osseous demineralization. Status post bilateral total hip arthroplasties in appropriate alignment. The femoral and acetabular components appear well seated. No periprosthetic lucency. No acute fracture. The sacroiliac joints and pubic symphysis are anatomically aligned. IMPRESSION: No acute osseous abnormality. 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 Electronically Signed   By: Hart Robinsons M.D.   On: 06/25/2023 15:28   DG Hip Unilat W or Wo Pelvis 2-3 Views Left  Result Date: 06/25/2023 CLINICAL DATA:  Fall.  Pain. EXAM: DG HIP (WITH OR WITHOUT PELVIS) 2-3V RIGHT; DG HIP (WITH OR WITHOUT PELVIS) 2-3V LEFT COMPARISON:  Hip radiographs dated January 05, 2018. FINDINGS: There is diffuse osseous demineralization. Status post bilateral total hip arthroplasties in appropriate alignment. The femoral and acetabular components appear well seated. No periprosthetic lucency. No acute fracture. The sacroiliac joints and pubic symphysis are anatomically aligned. IMPRESSION: No acute osseous abnormality. 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 Electronically Signed   By: Hart Robinsons M.D.   On: 06/25/2023 15:28    Procedures Procedures    Medications Ordered in ED Medications  fentaNYL (SUBLIMAZE) injection 50 mcg (50 mcg Intravenous Given 06/25/23 1856)  iohexol (OMNIPAQUE) 300 MG/ML solution 100 mL (100 mLs Intravenous Contrast Given 06/25/23 1954)  HYDROmorphone (DILAUDID) injection 1 mg (1 mg Intravenous Given 06/25/23 2053)    ED Course/ Medical Decision Making/ A&P                                 Medical  Decision Making Amount and/or Complexity of Data Reviewed Labs: ordered. Radiology: ordered.  Risk Prescription drug management.   72 year old female presents emergency department with worsening low back pain, bilateral hip pain and lower abdominal pain.  This is all stemming from a fall that she had about a month ago.  Was recently seen for the fall, had head imaging and arm imaging that identified a wrist fracture.  She is on anticoagulation.  Vitals are normal and stable here.  She is tenderness to palpation of the lower abdomen in the lower back as well as the bilateral hips.  Blood work has no acute abnormalities.  X-ray of the b/l hip and pelvis showed no acute fracture.  CT of the thoracic and lumbar spine identified L4 compression fracture that is new along with old findings.  CT of the abdomen pelvis does not identify any acute source of pain.  After pain medicine patient feels improved.  Will plan for symptomatic treatment as an outpatient.  She already has established care with [place] [facility] specialist and will reach out to them tomorrow.  Patient at this time appears safe and stable for discharge and close outpatient follow up. Discharge plan and strict return to ED precautions discussed, patient verbalizes understanding and agreement.        Final Clinical Impression(s) / ED Diagnoses Final diagnoses:  None    Rx / DC Orders ED Discharge Orders     None  Rozelle Logan, DO 06/25/23 2238

## 2023-06-25 NOTE — Telephone Encounter (Signed)
Patient called to report chest pain  [place]- injury to wrist and back. Unable to see ortho for follow up on back until 10/16 however woke up this morning with CP.  Reports sudden sharp L side pain-constant pain x 15 mins, no NTG taken.   Denies SOB, NV,    Pt wanted to run by cards in the event this is cardiac however advised would recommend PCP triage call as well if this event is not 100% cardiac in nature

## 2023-06-25 NOTE — ED Triage Notes (Signed)
Per [facility], pt here for Lower back pain from fall 3 weeks ago  Increased pain and limited ROM without pain  Pain radiates into lower extremities VS wnl  Tylenol taken at home PTA, no longer taking narcotics due to side effects

## 2023-06-25 NOTE — ED Triage Notes (Signed)
Pt bib [facility], c/o bilateral hip pain. Endorses fall on 9/20. Pt RT arm in cast

## 2023-06-26 NOTE — Telephone Encounter (Signed)
Pt aware-reports she was seen in ER 10/7

## 2023-06-28 ENCOUNTER — Ambulatory Visit: Payer: Medicare Other | Admitting: Internal Medicine

## 2023-07-03 ENCOUNTER — Other Ambulatory Visit: Payer: Self-pay | Admitting: Neurosurgery

## 2023-07-03 DIAGNOSIS — S32040A Wedge compression fracture of fourth lumbar vertebra, initial encounter for closed fracture: Secondary | ICD-10-CM

## 2023-07-04 ENCOUNTER — Inpatient Hospital Stay
Admission: RE | Admit: 2023-07-04 | Discharge: 2023-07-04 | Discharge status: Home / Self Care | Payer: Medicare Other | Source: Ambulatory Visit | Attending: Neurosurgery

## 2023-07-04 DIAGNOSIS — S32040A Wedge compression fracture of fourth lumbar vertebra, initial encounter for closed fracture: Secondary | ICD-10-CM

## 2023-07-06 ENCOUNTER — Encounter (HOSPITAL_COMMUNITY): Payer: Medicare Other | Admitting: Cardiology

## 2023-07-12 ENCOUNTER — Other Ambulatory Visit: Payer: Self-pay | Admitting: Internal Medicine

## 2023-07-12 DIAGNOSIS — G894 Chronic pain syndrome: Secondary | ICD-10-CM

## 2023-07-18 ENCOUNTER — Other Ambulatory Visit: Payer: Self-pay | Admitting: Neurosurgery

## 2023-07-19 ENCOUNTER — Other Ambulatory Visit: Payer: Self-pay | Admitting: Neurosurgery

## 2023-07-23 ENCOUNTER — Ambulatory Visit (HOSPITAL_COMMUNITY)
Admission: RE | Admit: 2023-07-23 | Discharge: 2023-07-23 | Disposition: A | Discharge status: Home / Self Care | Payer: Medicare Other | Source: Ambulatory Visit | Attending: Cardiology | Admitting: Cardiology

## 2023-07-23 ENCOUNTER — Encounter (HOSPITAL_COMMUNITY): Payer: Self-pay | Admitting: Cardiology

## 2023-07-23 VITALS — BP 144/63 | HR 52 | Wt 271.6 lb

## 2023-07-23 DIAGNOSIS — Z01810 Encounter for preprocedural cardiovascular examination: Secondary | ICD-10-CM | POA: Diagnosis present

## 2023-07-23 DIAGNOSIS — I5032 Chronic diastolic (congestive) heart failure: Secondary | ICD-10-CM | POA: Diagnosis present

## 2023-07-23 DIAGNOSIS — I110 Hypertensive heart disease with heart failure: Secondary | ICD-10-CM | POA: Insufficient documentation

## 2023-07-23 DIAGNOSIS — Z9181 History of falling: Secondary | ICD-10-CM | POA: Diagnosis not present

## 2023-07-23 DIAGNOSIS — G4733 Obstructive sleep apnea (adult) (pediatric): Secondary | ICD-10-CM | POA: Diagnosis not present

## 2023-07-23 DIAGNOSIS — I361 Nonrheumatic tricuspid (valve) insufficiency: Secondary | ICD-10-CM | POA: Insufficient documentation

## 2023-07-23 DIAGNOSIS — Z7901 Long term (current) use of anticoagulants: Secondary | ICD-10-CM | POA: Diagnosis not present

## 2023-07-23 DIAGNOSIS — I50810 Right heart failure, unspecified: Secondary | ICD-10-CM

## 2023-07-23 DIAGNOSIS — I272 Pulmonary hypertension, unspecified: Secondary | ICD-10-CM

## 2023-07-23 DIAGNOSIS — I480 Paroxysmal atrial fibrillation: Secondary | ICD-10-CM | POA: Insufficient documentation

## 2023-07-23 NOTE — Progress Notes (Addendum)
ADVANCED HEART FAILURE CLINIC NOTE  Referring Physician: Philip Aspen, Almira Bar*  Primary Care: Philip Aspen, Limmie Patricia, MD Primary Cardiologist: Dr. Rennis Golden  HF: Dr. Gasper Lloyd  HPI: Kathy [name] is a 72 y.o. female with HFpEF, paroxysmal atrial fibrillation, OSA (CPAP), hyperlipidemia and HTN presenting today for evaluation. Kathy [name] cardiac history dates back to 2019 when she presented to the ER  w/ complaints of dyspnea. She was found to have atrial fibrillation  w/ TTE demonstrating preserved LV function and severe LVH. She converted to NSR with metoprolol. Since that time she has also underwent LHC w/ nonobstructive CAD. Over the past 2 years she has continued to struggle with symptomatic paroxysmal atrial fibrillation. In 11/23 she was admitted with acute on chronic HFpEF; TTE at that time with dilated RV w/ moderately reduced systolic function. She was diuresed and discharged home. Since that time she has her functional status remains fairly limited.  She can perform all ADLs independently but frequently becomes very fatigued and short of breath after walking over 30 to 40 feet.  No PND and currently lower extremity edema is fairly well-controlled however she does quickly become hypervolemic with any dietary indiscretion. Kathy [name] has undergone RHC confirming RV dysfunction with reduced cardiac output/index likely secondary to long term OSA and severe TR. In addition, she has had afib for a prolonged period; underwent TEE/DCCV with conversion to sinus rhythm. cMRI in April 2024 demonstrated preserved LV function, severely dilated RV with RVEF 58%, D-shaped interventricular septum suggesting degree of RV pressure/volume overload, moderate to severe TR.  Interval hx:  She unfortunately had a fall recently leading to severe back pain and a fracture of her right wrist.  She is now planning to undergo kyphoplasty and until that time has become more reliant on her walker.  From a heart  failure/pulmonary hypertension standpoint, she is doing very well.  She has had no trouble with lower extremity edema.  Her shortness of breath is very minimal at this time.  She continues to recover from the loss of her son.   Past Medical History:  Diagnosis Date   Adenomatous colon polyp    2012   Anxiety and depression    Arthritis    Osteoarthritis, Dr Despina Hick   Cancer [facility])    skin - left foot excised   Chronic diastolic CHF (congestive heart failure) (HCC)    DDD (degenerative disc disease), cervical    Deaf, right    DVT (deep venous thrombosis) (HCC)    RLE DVT 08/2004, post right knee arthroscopy   Fibromyalgia    Dr Azzie Roup, Rheu   GERD (gastroesophageal reflux disease)    H/O hiatal hernia    Headache(784.0)    PAST HX MILD MIGRAINES - resolved per patient 08/18/20   History of kidney stones    passed stones   HTN (hypertension)    IBS (irritable bowel syndrome)    Internal hemorrhoid    Lumbar radiculopathy    Obesity    OSA on CPAP    Moderate with AHI 20/hr with nocturnal hypoxemia on CPAP   PAF (paroxysmal atrial fibrillation) (HCC) 06/2018   Pulmonary hypertension (HCC)     Current Outpatient Medications  Medication Sig Dispense Refill   ALPRAZolam (XANAX) 1 MG tablet Take 1 tablet (1 mg total) by mouth 3 (three) times daily as needed for anxiety. 90 tablet 0   amiodarone (PACERONE) 200 MG tablet Take 1 tablet (200 mg total) by mouth daily. 90 tablet 3  atorvastatin (LIPITOR) 40 MG tablet TAKE 1 TABLET BY MOUTH EVERY DAY 90 tablet 3   Calcium Carb-Cholecalciferol (CALCIUM 600 + D PO) Take 1 tablet by mouth daily.     cholecalciferol (VITAMIN D3) 25 MCG (1000 UNIT) tablet Take 1,000 Units by mouth once a week. For 12 weeks     clindamycin (CLEOCIN T) 1 % lotion Apply 1 application  topically 2 (two) times daily as needed (rosacea).     clobetasol (TEMOVATE) 0.05 % external solution Apply 1 application topically daily as needed (rosacea).      cyclobenzaprine (FLEXERIL) 5 MG tablet TAKE 1 TABLET BY MOUTH AT BEDTIME AS NEEDED FOR MUSCLE SPASMS. 30 tablet 0   ELIQUIS 5 MG TABS tablet TAKE 1 TABLET BY MOUTH TWICE A DAY 180 tablet 3   fluocinonide (LIDEX) 0.05 % external solution Apply 1 application topically daily as needed (rosacea).     FLUoxetine (PROZAC) 40 MG capsule TAKE 1 CAPSULE BY MOUTH EVERY DAY 90 capsule 1   furosemide (LASIX) 40 MG tablet Take 1 tablet (40 mg total) by mouth daily. 90 tablet 1   gabapentin (NEURONTIN) 300 MG capsule Take 1 capsule (300 mg total) by mouth at bedtime. 90 capsule 1   methocarbamol (ROBAXIN) 750 MG tablet Take 750 mg by mouth 3 (three) times daily as needed.     metroNIDAZOLE (METROCREAM) 0.75 % cream Apply 1 application topically 2 (two) times daily as needed (rosacea).      Multiple Vitamin (MULTIVITAMIN WITH MINERALS) TABS tablet Take 1 tablet by mouth daily. Centrum Silver     nitroGLYCERIN (NITROSTAT) 0.4 MG SL tablet Place 1 tablet (0.4 mg total) under the tongue every 5 (five) minutes as needed for chest pain. 90 tablet 3   omeprazole (PRILOSEC) 40 MG capsule TAKE 1 CAPSULE BY MOUTH EVERY DAY 90 capsule 1   potassium chloride (KLOR-CON) 10 MEQ tablet Take 2 tablets (20 mEq total) by mouth daily. 180 tablet 3   sacubitril-valsartan (ENTRESTO) 49-51 MG Take 1 tablet by mouth 2 (two) times daily. 60 tablet 11   traMADol (ULTRAM) 50 MG tablet Take 1 tablet (50 mg total) by mouth every 8 (eight) hours as needed. 15 tablet 0   No current facility-administered medications for this encounter.    Allergies  Allergen Reactions   Achromycin [Tetracycline Hcl] Shortness Of Breath    Dyspnea     Penicillins     Facial angioedema Did it involve swelling of the face/tongue/throat, SOB, or low BP? Yes Did it involve sudden or severe rash/hives, skin peeling, or any reaction on the inside of your mouth or nose? No Did you need to seek medical attention at a hospital or doctor's office? No When did  it last happen?      30+ years If all above answers are "NO", may proceed with cephalosporin use.    Sulfamethoxazole-Trimethoprim Hives   Nickel Itching   Oxycodone    Zanaflex [Tizanidine Hydrochloride]     Causes more pain       Social History   Socioeconomic History   Marital status: Widowed    Spouse name: Not on file   Number of children: 2   Years of education: Not on file   Highest education level: 12th grade  Occupational History   Occupation: retired  Tobacco Use   Smoking status: Never   Smokeless tobacco: Never  Vaping Use   Vaping status: Never Used  Substance and Sexual Activity   Alcohol use: Yes  Comment: occasional   Drug use: No   Sexual activity: Not on file    Comment: Hysterectomy  Other Topics Concern   Not on file  Social History Narrative   0 caffeine drinks, likes to read, crotchet, spend time with family & friends, very social   Social Determinants of Health   Financial Resource Strain: Low Risk  (12/27/2022)   Overall Financial Resource Strain (CARDIA)    Difficulty of Paying Living Expenses: Not hard at all  Food Insecurity: No Food Insecurity (12/28/2022)   Hunger Vital Sign    Worried About Running Out of Food in the Last Year: Never true    Ran Out of Food in the Last Year: Never true  Transportation Needs: No Transportation Needs (12/27/2022)   PRAPARE - Administrator, Civil Service (Medical): No    Lack of Transportation (Non-Medical): No  Physical Activity: Insufficiently Active (12/27/2022)   Exercise Vital Sign    Days of Exercise per Week: 2 days    Minutes of Exercise per Session: 30 min  Stress: No Stress Concern Present (12/27/2022)   [employer] of Occupational Health - Occupational Stress Questionnaire    Feeling of Stress : Only a little  Social Connections: Moderately Integrated (12/27/2022)   Social Connection and Isolation Panel [NHANES]    Frequency of Communication with Friends and Family: More  than three times a week    Frequency of Social Gatherings with Friends and Family: More than three times a week    Attends Religious Services: 1 to 4 times per year    Active Member of [employer] or Organizations: Yes    Attends Banker Meetings: 1 to 4 times per year    Marital Status: Widowed  Recent Concern: Social Connections - Moderately Isolated (11/15/2022)   Social Connection and Isolation Panel [NHANES]    Frequency of Communication with Friends and Family: More than three times a week    Frequency of Social Gatherings with Friends and Family: Three times a week    Attends Religious Services: More than 4 times per year    Active Member of Clubs or Organizations: No    Attends Banker Meetings: Never    Marital Status: Widowed  Intimate Partner Violence: Not At Risk (06/12/2023)   Received from [facility]   HITS    Over the last 12 months how often did your partner physically hurt you?: 1    Over the last 12 months how often did your partner insult you or talk down to you?: 1    Over the last 12 months how often did your partner threaten you with physical harm?: 1    Over the last 12 months how often did your partner scream or curse at you?: 1      Family History  Problem Relation Age of Onset   Heart attack Mother 90   COPD Mother    Breast cancer Mother 42   Hypertension Father    Diabetes Father    Heart failure Father 81   Breast cancer Paternal Aunt        cns mets   COPD Maternal Grandmother    Heart disease Paternal Grandmother        died 69   Diabetes Paternal Grandmother    Stroke Paternal Grandfather        in late 68s   Heart attack Brother 36   Colon cancer Neg Hx     PHYSICAL EXAM: Vitals:  07/23/23 1210  BP: (!) 144/63  Pulse: (!) 52  SpO2: 95%   GENERAL: Well nourished, well developed, and in no apparent distress at rest.  HEENT: Negative for arcus senilis or xanthelasma. There is no scleral icterus.  The mucous  membranes are pink and moist.   NECK: Supple, No masses. Normal carotid upstrokes without bruits. No masses or thyromegaly.    CHEST: There are no chest wall deformities. There is no chest wall tenderness. Respirations are unlabored.  Lungs-CTA bilaterally CARDIAC:  JVP: 7 cm          Normal rate with regular rhythm. No murmurs, rubs or gallops.  Pulses are 2+ and symmetrical in upper and lower extremities.  No edema.  ABDOMEN: Soft, non-tender, non-distended. There are no masses or hepatomegaly. There are normal bowel sounds.  EXTREMITIES: Warm and well perfused with no cyanosis, clubbing.  LYMPHATIC: No axillary or supraclavicular lymphadenopathy.  NEUROLOGIC: Patient is oriented x3 with no focal or lateralizing neurologic deficits.  PSYCH: Patients affect is appropriate, there is no evidence of anxiety or depression.  SKIN: Warm and dry; no lesions or wounds.     DATA REVIEW  ECG: NSR 61 bpm  cMRI 12/25/22: LVEF 69%, RVEF 58%, RV severely dilated, D-shaped IVS suggestive of RV pressure/volume overload, nonspecific LGE at basal to mid inferoseptal RV insertion site, moderate to severe TR with regurgitant fraction of 36% and regurgitant volume of 65 cc  ECHO: 07/27/22: LVEF 50-55%, RV is dilated with mild to moderately reduced systolic function. RA is severely dilated. There is severe eccentric TR.    CATH: 06/26/18:  Ost RCA lesion is 30% stenosed. The left ventricular systolic function is normal. LV end diastolic pressure is mildly elevated. The left ventricular ejection fraction is 55-65% by visual estimate.   10/10/22: RA:                  13 mmHg (mean) RV:                  41/10-15 mmHg PA:                  40/15 mmHg (21 mean) PCWP:            14 mmHg (mean)                                      Estimated Fick CO/CI   4.39 L/min, 1.9 L/min/m2 Thermodilution CO/CI   3.61 L/min, 1.5 L/min/m2                                            TPG                 7  mmHg                                               PVR                 1.6-1.94 Wood Units  PAPi                1.9       IMPRESSION: Severely elevated pre-capillary pressures.  Normal left sided filling pressures Normal PVR and PA mean.  Severely reduced cardiac index likely secondary to suboptimal RV function. (VQQ:VZDG of 1, PAPi of 1.9. )   ASSESSMENT & PLAN:  RV Failure/Severe TR - Echocardiograms dating back to 2019 demonstrate severe TR. On her initial TTE in 2019 she has severe TR with mildly dilated RV and preserved LV function. Over the past 3 years, echocardiograms demonstrate gradual progression of TR and worsening of RV function.  Uncertain if this is primary TR or a sequale of underlying pulmonary hypertension.  - RHC/TEE on 10/10/22 confirming RV failure with severe TR. Likely has had prolonged atrial fibrillation and OSA.  - Cardiac MRI 04/24 with RVEF 58%, severely dilated RV and evidence of moderate to severe TR. - Initially noted significant improvement in functional status with cardioversion from Afib >> sinus rhythm. More shortness of breath last few days. See below. - Will consider triclip in the future -Euvolemic on exam today with JVP less than 7 cm.  Doing very well on her current medication regimen.  No changes at this time. -Repeat BMP/BNP tomorrow  2. Heart failure w/ preserved EF  - Most recent TTE w/ indeterminate diastolic function; tissue doppler not consistent with significant diastolic dysfunction.  - NYHA IIB symptoms - Continue Entresto 49/51 mg daily -Lasix 20mg  daily. Euvolemic on exam today.  - labs tomorrow; will follow.   3. Atrial fibrillation  - Continue Apixaban 5mg  BID - Now s/p TEE/DCCV in January 2024. - In normal sinus rhythm today. - Continue Amiodarone 200mg  BID.  -Will repeat TSH/CMP  4. OSA  - AHI 20/hr with O2 sats as low as 79%.  - Now tolerating CPAP well.  -Compliant with CPAP  5. Diarrhea -Resolved  6. Pre-operative clearance - She is  stable from a RV failure standpoint; euvolemic on exam. Okay to proceed with kyphoplasty; low to moderate risk w/ general anesthesia.  - Hold apixaban 3 days prior.   Rim Thatch Advanced Heart Failure Mechanical Circulatory Support

## 2023-07-23 NOTE — Pre-Procedure Instructions (Signed)
Surgical Instructions   Your procedure is scheduled on Tuesday, November 12th. Report to [facility] Main Entrance "A" at 05:30 A.M., then check in with the Admitting office. Any questions or running late day of surgery: call [phone]  Questions prior to your surgery date: call [phone], Monday-Friday, 8am-4pm. If you experience any cold or flu symptoms such as cough, fever, chills, shortness of breath, etc. between now and your scheduled surgery, please notify us at the above number.     Remember:  Do not eat or drink after midnight the night before your surgery    Take these medicines the morning of surgery with A SIP OF WATER  amiodarone (PACERONE)  atorvastatin (LIPITOR)  FLUoxetine (PROZAC)  omeprazole (PRILOSEC)    May take these medicines IF NEEDED: ALPRAZolam (XANAX)  methocarbamol (ROBAXIN)  nitroGLYCERIN (NITROSTAT)- if you have to take this medication prior to surgery, please call one of the above phone numbers and report this to a Nurse traMADol (ULTRAM)    Hold ELIQUIS for 3 days prior to surgery. Last dose will be 11/8.  One week prior to surgery, STOP taking any Aspirin (unless otherwise instructed by your surgeon) Aleve, Naproxen, Ibuprofen, Motrin, Advil, Goody's, BC's, all herbal medications, fish oil, and non-prescription vitamins.                     Do NOT Smoke (Tobacco/Vaping) for 24 hours prior to your procedure.  If you use a CPAP at night, you may bring your mask/headgear for your overnight stay.   You will be asked to remove any contacts, glasses, piercing's, hearing aid's, dentures/partials prior to surgery. Please bring cases for these items if needed.    Patients discharged the day of surgery will not be allowed to drive home, and someone needs to stay with them for 24 hours.  SURGICAL WAITING ROOM VISITATION Patients may have no more than 2 support people in the waiting area - these visitors may rotate.   Pre-op nurse will coordinate  an appropriate time for 1 ADULT support person, who may not rotate, to accompany patient in pre-op.  Children under the age of 56 must have an adult with them who is not the patient and must remain in the main waiting area with an adult.  If the patient needs to stay at the hospital during part of their recovery, the visitor guidelines for inpatient rooms apply.  Please refer to the [facility] website for the visitor guidelines for any additional information.   If you received a COVID test during your pre-op visit  it is requested that you wear a mask when out in public, stay away from anyone that may not be feeling well and notify your surgeon if you develop symptoms. If you have been in contact with anyone that has tested positive in the last 10 days please notify you surgeon.      Pre-operative 5 CHG Bathing Instructions   You can play a key role in reducing the risk of infection after surgery. Your skin needs to be as free of germs as possible. You can reduce the number of germs on your skin by washing with CHG (chlorhexidine gluconate) soap before surgery. CHG is an antiseptic soap that kills germs and continues to kill germs even after washing.   DO NOT use if you have an allergy to chlorhexidine/CHG or antibacterial soaps. If your skin becomes reddened or irritated, stop using the CHG and notify one of our RNs at [phone].  Please shower with the CHG soap starting 4 days before surgery using the following schedule:     Please keep in mind the following:  DO NOT shave, including legs and underarms, starting the day of your first shower.   You may shave your face at any point before/day of surgery.  Place clean sheets on your bed the day you start using CHG soap. Use a clean washcloth (not used since being washed) for each shower. DO NOT sleep with pets once you start using the CHG.   CHG Shower Instructions:  Wash your face and private area with normal soap. If you choose  to wash your hair, wash first with your normal shampoo.  After you use shampoo/soap, rinse your hair and body thoroughly to remove shampoo/soap residue.  Turn the water OFF and apply about 3 tablespoons (45 ml) of CHG soap to a CLEAN washcloth.  Apply CHG soap ONLY FROM YOUR NECK DOWN TO YOUR TOES (washing for 3-5 minutes)  DO NOT use CHG soap on face, private areas, open wounds, or sores.  Pay special attention to the area where your surgery is being performed.  If you are having back surgery, having someone wash your back for you may be helpful. Wait 2 minutes after CHG soap is applied, then you may rinse off the CHG soap.  Pat dry with a clean towel  Put on clean clothes/pajamas   If you choose to wear lotion, please use ONLY the CHG-compatible lotions on the back of this paper.   Additional instructions for the day of surgery: DO NOT APPLY any lotions, deodorants, cologne, or perfumes.   Do not bring valuables to the hospital. [facility] is not responsible for any belongings/valuables. Do not wear nail polish, gel polish, artificial nails, or any other type of covering on natural nails (fingers and toes) Do not wear jewelry or makeup Put on clean/comfortable clothes.  Please brush your teeth.  Ask your nurse before applying any prescription medications to the skin.     CHG Compatible Lotions   Aveeno Moisturizing lotion  Cetaphil Moisturizing Cream  Cetaphil Moisturizing Lotion  Clairol Herbal Essence Moisturizing Lotion, Dry Skin  Clairol Herbal Essence Moisturizing Lotion, Extra Dry Skin  Clairol Herbal Essence Moisturizing Lotion, Normal Skin  Curel Age Defying Therapeutic Moisturizing Lotion with Alpha Hydroxy  Curel Extreme Care Body Lotion  Curel Soothing Hands Moisturizing Hand Lotion  Curel Therapeutic Moisturizing Cream, Fragrance-Free  Curel Therapeutic Moisturizing Lotion, Fragrance-Free  Curel Therapeutic Moisturizing Lotion, Original Formula  Eucerin Daily  Replenishing Lotion  Eucerin Dry Skin Therapy Plus Alpha Hydroxy Crme  Eucerin Dry Skin Therapy Plus Alpha Hydroxy Lotion  Eucerin Original Crme  Eucerin Original Lotion  Eucerin Plus Crme Eucerin Plus Lotion  Eucerin TriLipid Replenishing Lotion  Keri Anti-Bacterial Hand Lotion  Keri Deep Conditioning Original Lotion Dry Skin Formula Softly Scented  Keri Deep Conditioning Original Lotion, Fragrance Free Sensitive Skin Formula  Keri Lotion Fast Absorbing Fragrance Free Sensitive Skin Formula  Keri Lotion Fast Absorbing Softly Scented Dry Skin Formula  Keri Original Lotion  Keri Skin Renewal Lotion Keri Silky Smooth Lotion  Keri Silky Smooth Sensitive Skin Lotion  Nivea Body Creamy Conditioning Oil  Nivea Body Extra Enriched Teacher, adult education Moisturizing Lotion Nivea Crme  Nivea Skin Firming Lotion  NutraDerm 30 Skin Lotion  NutraDerm Skin Lotion  NutraDerm Therapeutic Skin Cream  NutraDerm Therapeutic Skin Lotion  ProShield Protective Hand Cream  Provon moisturizing lotion  Please read over the following fact sheets that you were given.

## 2023-07-23 NOTE — Patient Instructions (Signed)
Medication Changes: No Changes In Medications at this time.   Follow-Up in: 3 months PLEASE CALL OUR OFFICE AROUND DECEMBER  TO GET SCHEDULED FOR YOUR APPOINTMENT. PHONE NUMBER IS [phone] OPTION 2   At the [facility], you and your health needs are our priority. We have a designated team specialized in the treatment of Heart Failure. This Care Team includes your primary Heart Failure Specialized Cardiologist (physician), Advanced Practice Providers (APPs- Physician Assistants and Nurse Practitioners), and Pharmacist who all work together to provide you with the care you need, when you need it.   You may see any of the following providers on your designated Care Team at your next follow up:  Dr. Arvilla Meres Dr. Marca Ancona Dr. Dorthula Nettles Dr. Theresia Bough Tonye Becket, NP Robbie Lis, [place] [facility] [place], [place] Brynda Peon, NP [place] Lee, NP Karle Plumber, PharmD   Please be sure to bring in all your medications bottles to every appointment.   Need to Contact us:  If you have any questions or concerns before your next appointment please send [name] a message through [place] or call our office at [phone].    TO LEAVE A MESSAGE FOR THE NURSE SELECT OPTION 2, PLEASE LEAVE A MESSAGE INCLUDING: YOUR NAME DATE OF BIRTH CALL BACK NUMBER REASON FOR CALL**this is important as we prioritize the call backs  YOU WILL RECEIVE A CALL BACK THE SAME DAY AS LONG AS YOU CALL BEFORE 4:00 PM

## 2023-07-24 ENCOUNTER — Other Ambulatory Visit: Payer: Self-pay

## 2023-07-24 ENCOUNTER — Encounter (HOSPITAL_COMMUNITY)
Admission: RE | Admit: 2023-07-24 | Discharge: 2023-07-24 | Disposition: A | Discharge status: Home / Self Care | Payer: Medicare Other | Source: Ambulatory Visit | Attending: Neurosurgery | Admitting: Neurosurgery

## 2023-07-24 ENCOUNTER — Encounter (HOSPITAL_COMMUNITY): Payer: Self-pay

## 2023-07-24 VITALS — BP 132/64 | HR 64 | Temp 98.3°F | Resp 17 | Ht 66.0 in | Wt 273.0 lb

## 2023-07-24 DIAGNOSIS — M797 Fibromyalgia: Secondary | ICD-10-CM | POA: Insufficient documentation

## 2023-07-24 DIAGNOSIS — I272 Pulmonary hypertension, unspecified: Secondary | ICD-10-CM | POA: Diagnosis not present

## 2023-07-24 DIAGNOSIS — I130 Hypertensive heart and chronic kidney disease with heart failure and stage 1 through stage 4 chronic kidney disease, or unspecified chronic kidney disease: Secondary | ICD-10-CM | POA: Diagnosis not present

## 2023-07-24 DIAGNOSIS — G4733 Obstructive sleep apnea (adult) (pediatric): Secondary | ICD-10-CM | POA: Insufficient documentation

## 2023-07-24 DIAGNOSIS — K219 Gastro-esophageal reflux disease without esophagitis: Secondary | ICD-10-CM | POA: Insufficient documentation

## 2023-07-24 DIAGNOSIS — Z86718 Personal history of other venous thrombosis and embolism: Secondary | ICD-10-CM | POA: Diagnosis not present

## 2023-07-24 DIAGNOSIS — M1990 Unspecified osteoarthritis, unspecified site: Secondary | ICD-10-CM | POA: Insufficient documentation

## 2023-07-24 DIAGNOSIS — R011 Cardiac murmur, unspecified: Secondary | ICD-10-CM | POA: Insufficient documentation

## 2023-07-24 DIAGNOSIS — N189 Chronic kidney disease, unspecified: Secondary | ICD-10-CM | POA: Insufficient documentation

## 2023-07-24 DIAGNOSIS — M8000XA Age-related osteoporosis with current pathological fracture, unspecified site, initial encounter for fracture: Secondary | ICD-10-CM | POA: Insufficient documentation

## 2023-07-24 DIAGNOSIS — I480 Paroxysmal atrial fibrillation: Secondary | ICD-10-CM | POA: Insufficient documentation

## 2023-07-24 DIAGNOSIS — I2510 Atherosclerotic heart disease of native coronary artery without angina pectoris: Secondary | ICD-10-CM | POA: Insufficient documentation

## 2023-07-24 DIAGNOSIS — I5032 Chronic diastolic (congestive) heart failure: Secondary | ICD-10-CM | POA: Insufficient documentation

## 2023-07-24 DIAGNOSIS — Z6841 Body Mass Index (BMI) 40.0 and over, adult: Secondary | ICD-10-CM | POA: Insufficient documentation

## 2023-07-24 DIAGNOSIS — K449 Diaphragmatic hernia without obstruction or gangrene: Secondary | ICD-10-CM | POA: Diagnosis not present

## 2023-07-24 DIAGNOSIS — Z01812 Encounter for preprocedural laboratory examination: Secondary | ICD-10-CM | POA: Insufficient documentation

## 2023-07-24 DIAGNOSIS — Z01818 Encounter for other preprocedural examination: Secondary | ICD-10-CM

## 2023-07-24 LAB — CBC
HCT: 37.4 % (ref 36.0–46.0)
Hemoglobin: 11.5 g/dL — ABNORMAL LOW (ref 12.0–15.0)
MCH: 25.6 pg — ABNORMAL LOW (ref 26.0–34.0)
MCHC: 30.7 g/dL (ref 30.0–36.0)
MCV: 83.3 fL (ref 80.0–100.0)
Platelets: 205 10*3/uL (ref 150–400)
RBC: 4.49 MIL/uL (ref 3.87–5.11)
RDW: 17.2 % — ABNORMAL HIGH (ref 11.5–15.5)
WBC: 4.4 10*3/uL (ref 4.0–10.5)
nRBC: 0 % (ref 0.0–0.2)

## 2023-07-24 LAB — BASIC METABOLIC PANEL
Anion gap: 8 (ref 5–15)
BUN: 27 mg/dL — ABNORMAL HIGH (ref 8–23)
CO2: 28 mmol/L (ref 22–32)
Calcium: 9.4 mg/dL (ref 8.9–10.3)
Chloride: 97 mmol/L — ABNORMAL LOW (ref 98–111)
Creatinine, Ser: 1.35 mg/dL — ABNORMAL HIGH (ref 0.44–1.00)
GFR, Estimated: 42 mL/min — ABNORMAL LOW (ref >60)
Glucose, Bld: 108 mg/dL — ABNORMAL HIGH (ref 70–99)
Potassium: 4.6 mmol/L (ref 3.5–5.1)
Sodium: 133 mmol/L — ABNORMAL LOW (ref 135–145)

## 2023-07-24 LAB — SURGICAL PCR SCREEN

## 2023-07-24 NOTE — Pre-Procedure Instructions (Signed)
Surgical Instructions   Your procedure is scheduled on Tuesday, November 12th. Report to [facility] Main Entrance "A" at 05:30 A.M., then check in with the Admitting office. Any questions or running late day of surgery: call [phone]  Questions prior to your surgery date: call [phone], Monday-Friday, 8am-4pm. If you experience any cold or flu symptoms such as cough, fever, chills, shortness of breath, etc. between now and your scheduled surgery, please notify us at the above number.     Remember:  Do not eat or drink after midnight the night before your surgery    Take these medicines the morning of surgery with A SIP OF WATER  amiodarone (PACERONE)  FLUoxetine (PROZAC)  omeprazole (PRILOSEC)    May take these medicines IF NEEDED: ALPRAZolam (XANAX)  methocarbamol (ROBAXIN)  nitroGLYCERIN (NITROSTAT)- if you have to take this medication prior to surgery, please call one of the above phone numbers and report this to a Nurse traMADol (ULTRAM)  cyclobenzaprine (FLEXERIL)    Hold ELIQUIS for 3 days prior to surgery. Last dose will be 11/8.  One week prior to surgery, STOP taking any Aspirin (unless otherwise instructed by your surgeon) Aleve, Naproxen, Ibuprofen, Motrin, Advil, Goody's, BC's, all herbal medications, fish oil, and non-prescription vitamins.                     Do NOT Smoke (Tobacco/Vaping) for 24 hours prior to your procedure.  If you use a CPAP at night, you may bring your mask/headgear for your overnight stay.   You will be asked to remove any contacts, glasses, piercing's, hearing aid's, dentures/partials prior to surgery. Please bring cases for these items if needed.    Patients discharged the day of surgery will not be allowed to drive home, and someone needs to stay with them for 24 hours.  SURGICAL WAITING ROOM VISITATION Patients may have no more than 2 support people in the waiting area - these visitors may rotate.   Pre-op nurse will  coordinate an appropriate time for 1 ADULT support person, who may not rotate, to accompany patient in pre-op.  Children under the age of 67 must have an adult with them who is not the patient and must remain in the main waiting area with an adult.  If the patient needs to stay at the hospital during part of their recovery, the visitor guidelines for inpatient rooms apply.  Please refer to the [facility] website for the visitor guidelines for any additional information.   If you received a COVID test during your pre-op visit  it is requested that you wear a mask when out in public, stay away from anyone that may not be feeling well and notify your surgeon if you develop symptoms. If you have been in contact with anyone that has tested positive in the last 10 days please notify you surgeon.      Pre-operative 5 CHG Bathing Instructions   You can play a key role in reducing the risk of infection after surgery. Your skin needs to be as free of germs as possible. You can reduce the number of germs on your skin by washing with CHG (chlorhexidine gluconate) soap before surgery. CHG is an antiseptic soap that kills germs and continues to kill germs even after washing.   DO NOT use if you have an allergy to chlorhexidine/CHG or antibacterial soaps. If your skin becomes reddened or irritated, stop using the CHG and notify one of our RNs at [phone].  Please shower with the CHG soap starting 4 days before surgery using the following schedule:     Please keep in mind the following:  DO NOT shave, including legs and underarms, starting the day of your first shower.   You may shave your face at any point before/day of surgery.  Place clean sheets on your bed the day you start using CHG soap. Use a clean washcloth (not used since being washed) for each shower. DO NOT sleep with pets once you start using the CHG.   CHG Shower Instructions:  Wash your face and private area with normal soap. If  you choose to wash your hair, wash first with your normal shampoo.  After you use shampoo/soap, rinse your hair and body thoroughly to remove shampoo/soap residue.  Turn the water OFF and apply about 3 tablespoons (45 ml) of CHG soap to a CLEAN washcloth.  Apply CHG soap ONLY FROM YOUR NECK DOWN TO YOUR TOES (washing for 3-5 minutes)  DO NOT use CHG soap on face, private areas, open wounds, or sores.  Pay special attention to the area where your surgery is being performed.  If you are having back surgery, having someone wash your back for you may be helpful. Wait 2 minutes after CHG soap is applied, then you may rinse off the CHG soap.  Pat dry with a clean towel  Put on clean clothes/pajamas   If you choose to wear lotion, please use ONLY the CHG-compatible lotions on the back of this paper.   Additional instructions for the day of surgery: DO NOT APPLY any lotions, deodorants, cologne, or perfumes.   Do not bring valuables to the hospital. [facility] is not responsible for any belongings/valuables. Do not wear nail polish, gel polish, artificial nails, or any other type of covering on natural nails (fingers and toes) Do not wear jewelry or makeup Put on clean/comfortable clothes.  Please brush your teeth.  Ask your nurse before applying any prescription medications to the skin.     CHG Compatible Lotions   Aveeno Moisturizing lotion  Cetaphil Moisturizing Cream  Cetaphil Moisturizing Lotion  Clairol Herbal Essence Moisturizing Lotion, Dry Skin  Clairol Herbal Essence Moisturizing Lotion, Extra Dry Skin  Clairol Herbal Essence Moisturizing Lotion, Normal Skin  Curel Age Defying Therapeutic Moisturizing Lotion with Alpha Hydroxy  Curel Extreme Care Body Lotion  Curel Soothing Hands Moisturizing Hand Lotion  Curel Therapeutic Moisturizing Cream, Fragrance-Free  Curel Therapeutic Moisturizing Lotion, Fragrance-Free  Curel Therapeutic Moisturizing Lotion, Original Formula  Eucerin  Daily Replenishing Lotion  Eucerin Dry Skin Therapy Plus Alpha Hydroxy Crme  Eucerin Dry Skin Therapy Plus Alpha Hydroxy Lotion  Eucerin Original Crme  Eucerin Original Lotion  Eucerin Plus Crme Eucerin Plus Lotion  Eucerin TriLipid Replenishing Lotion  Keri Anti-Bacterial Hand Lotion  Keri Deep Conditioning Original Lotion Dry Skin Formula Softly Scented  Keri Deep Conditioning Original Lotion, Fragrance Free Sensitive Skin Formula  Keri Lotion Fast Absorbing Fragrance Free Sensitive Skin Formula  Keri Lotion Fast Absorbing Softly Scented Dry Skin Formula  Keri Original Lotion  Keri Skin Renewal Lotion Keri Silky Smooth Lotion  Keri Silky Smooth Sensitive Skin Lotion  Nivea Body Creamy Conditioning Oil  Nivea Body Extra Enriched Teacher, adult education Moisturizing Lotion Nivea Crme  Nivea Skin Firming Lotion  NutraDerm 30 Skin Lotion  NutraDerm Skin Lotion  NutraDerm Therapeutic Skin Cream  NutraDerm Therapeutic Skin Lotion  ProShield Protective Hand Cream  Provon moisturizing lotion  Please read over the following fact sheets that you were given.

## 2023-07-24 NOTE — Progress Notes (Signed)
PCP - Keith Rake, MD Cardiologist - Dr. Leodis Sias Heart Failure - Dr. Dorthula Nettles - last office visit 07/23/2023  PPM/ICD - Denies Device Orders - n/a  Rep Notified - n/a  Chest x-ray - n/a EKG - 04/03/2023 Stress Test - 02/12/2016 ECHO - 10/10/2022 Cardiac Cath - 10/10/2022  Sleep Study - +OSA. Pt wears CPAP nightly. She does not know her pressure settings.  No DM  Last dose of GLP1 agonist- n/a  GLP1 instructions: n/a  Blood Thinner Instructions: Pt instructed to hold Eliquis for 3 days. Last dose will be 07/27/23 Aspirin Instructions: n/a  NPO after midnight  COVID TEST- n/a   Anesthesia review: Yes. Cardiac Clearance. Hx of HF, A.Fib, HTN, OSA, CKD3.   Patient denies shortness of breath, fever, cough and chest pain at PAT appointment. Pt denies any respiratory illness/infection in the last two months. She has ongoing nasal drainage from seasonal allergies, but no other symptoms.    All instructions explained to the patient, with a verbal understanding of the material. Patient agrees to go over the instructions while at home for a better understanding. Patient also instructed to self quarantine after being tested for COVID-19. The opportunity to ask questions was provided.

## 2023-07-25 ENCOUNTER — Telehealth: Payer: Self-pay

## 2023-07-25 NOTE — Anesthesia Preprocedure Evaluation (Addendum)
Anesthesia Evaluation  Patient identified by MRN, date of birth, ID band Patient awake    Reviewed: Allergy & Precautions, NPO status , Patient's Chart, lab work & pertinent test results, reviewed documented beta blocker date and time   History of Anesthesia Complications Negative for: history of anesthetic complications  Airway Mallampati: III  TM Distance: >3 FB     Dental no notable dental hx.    Pulmonary sleep apnea    breath sounds clear to auscultation       Cardiovascular hypertension, (-) angina +CHF  (-) CAD + dysrhythmias  Rhythm:Regular Rate:Normal  MRI Cardiac 12/25/22: 1.  Normal LV size with EF 69%. 2. Severely dilated RV with normal systolic function, EF 58%. Mildly D-shaped interventricular septum suggestive of a degree of RV pressure/volume overload. 3. There was mid-wall LGE at the basal to mid inferoseptal RV insertion site. This is nonspecific and can be reflective of pressure/volume overload. 4. Moderate-severe tricuspid regurgitation with regurgitant fraction 36% (moderate) and regurgitant volume 65 cc (severe).    RHC 10/10/22: 1. Severely elevated pre-capillary pressures.  2. Normal left sided filling pressures 3. Normal PVR and PA mean.  4. Severely reduced cardiac index likely secondary to suboptimal RV function. (XBM:WUXL of 1, PAPi of 1.9. )   TEE 10/10/22:  1. Left ventricular ejection fraction, by estimation, is 45 to 50%. The left ventricle has mildly decreased function.   2. Right ventricular systolic function is moderately reduced. The right ventricular size is severely enlarged. Moderately increased right ventricular wall thickness.   3. Left atrial size was moderately dilated. No left atrial/left atrial appendage thrombus was detected.   4. Right atrial size was severely dilated.   5. The mitral valve is normal in structure. Trivial mitral valve regurgitation.   6. Tricuspid valve  regurgitation is severe.   7. The aortic valve is normal in structure. Aortic valve regurgitation is not visualized.       Neuro/Psych  Headaches PSYCHIATRIC DISORDERS Anxiety Depression     Neuromuscular disease    GI/Hepatic hiatal hernia,GERD  ,,(+) neg Cirrhosis        Endo/Other    Renal/GU CRFRenal disease     Musculoskeletal  (+) Arthritis ,  Fibromyalgia -  Abdominal   Peds  Hematology  (+) Blood dyscrasia, anemia   Anesthesia Other Findings   Reproductive/Obstetrics                              Anesthesia Physical Anesthesia Plan  ASA: 3  Anesthesia Plan: General   Post-op Pain Management:    Induction: Intravenous  PONV Risk Score and Plan: 2 and Ondansetron and Dexamethasone  Airway Management Planned: Oral ETT and Video Laryngoscope Planned  Additional Equipment: ClearSight  Intra-op Plan:   Post-operative Plan: Extubation in OR  Informed Consent: I have reviewed the patients History and Physical, chart, labs and discussed the procedure including the risks, benefits and alternatives for the proposed anesthesia with the patient or authorized representative who has indicated his/her understanding and acceptance.     Dental advisory given  Plan Discussed with: CRNA  Anesthesia Plan Comments: (PAT note written 07/25/2023 by Shonna Chock, PA-C.  )        Anesthesia Quick Evaluation

## 2023-07-25 NOTE — Progress Notes (Signed)
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 ED evaluation 06/25/23 for worsening lower back and hip pain following fall weeks prior. Acute L4 minimal superior endplate deformity noted.   History includes never smoker, HTN, murmur (reported MVP; moderate-severe TR 12/2022), chronic diastolic CHF, PAF (06/2018, presented with afib with RVR, troponin peaked 4.96 believed related to demand ischemia, LHC showed mild CAD, 30% RCA; DCCV 10/10/22), pulmonary hypertension (PASP 61 mmHg 01/2020), OSA (uses CPAP), CKD, hiatal hernia, GERD, IBS, fibromyalgia, syncope (~ 2000, 02/03/20 felt likely due to afib with RVR, admitted to [facility]), DVT (08/2004 post right knee arthroscopy 08/31/04), osteoarthritis (left THA THA 02/21/10; right THA 03/18/12), spinal surgery (L1 kyphoplasty 08/19/20), morbid obesity.     Last cardiology evaluation was on 07/23/23 by HF cardiologist Dr. Gasper [name]. Known PAF since 06/2018. Mild CAD at that time. Admitted 07/2022 with acute on chronic CHF. TTE showed LVEF 50-55%, dilated RV with severely reduced systolic function, moderately elevated PASP, severe TR. She underwent RHC in 09/2022 confirming RV dysfunction with reduced cardiac output/index likely secondary to long term OSA and severe TR. She required TEE/DCCV 10/10/22 for prolonged afib. Cardiac MRI in April 2024 demonstrated preserved LV function, severely dilated RV with RVEF 58%, D-shaped interventricular septum suggesting degree of RV pressure/volume overload, moderate to severe TR. Stable fatigue and DOE on current regimen. Meds include amiodarone, Eliquis, Lasix 40 mg daily, Entresto, Lipitor. He noted plans for kyphoplasty and  wrote, "Pre-operative clearance - She is stable from a RV failure standpoint; euvolemic on exam. Okay to proceed with kyphoplasty; low to moderate risk w/ general anesthesia.  - Hold apixaban 3 days prior."  She reported last Eliquis scheduled for 07/27/23.  Anesthesia team to evaluate day of surgery.     VS: BP 132/64   Pulse 64   Temp 36.8 C   Resp 17   Ht 5\' 6"  (1.676 m)   Wt 123.8 kg   SpO2 96%   BMI 44.06 kg/m    PROVIDERS: Philip Aspen, Limmie Patricia, MD is PCP  Kristeen Miss, MD is cardiologist Dorthula Nettles, DO is HF cardiologist Armanda Magic, MD is OSA cardiologist  LABS: Preoperative labs noted. Nasal PCR invalid. Creatinine 1.35, but overall consistent with most recent results.  (all labs ordered are listed, but only abnormal results are displayed)  Labs Reviewed  SURGICAL PCR SCREEN - Abnormal; Notable for the following components:      Result Value   MRSA, PCR   (*)    Value: INVALID, UNABLE TO DETERMINE THE PRESENCE OF TARGET DUE TO SPECIMEN INTEGRITY. RECOLLECTION REQUESTED.   Staphylococcus aureus   (*)    Value: INVALID, UNABLE TO DETERMINE THE PRESENCE OF TARGET DUE TO SPECIMEN INTEGRITY. RECOLLECTION REQUESTED.   All other components within normal limits  BASIC METABOLIC PANEL - Abnormal; Notable for the following components:   Sodium 133 (*)    Chloride 97 (*)    Glucose, Bld 108 (*)    BUN 27 (*)    Creatinine, Ser 1.35 (*)    GFR, Estimated Kathy (*)    All other components within normal limits  CBC - Abnormal; Notable for the following components:   Hemoglobin  11.5 (*)    MCH 25.6 (*)    RDW 17.2 (*)    All other components within normal limits     IMAGES: CT Abd/pelvis, T/L Spine 06/25/23: IMPRESSION: 1. Acute appearing, minimal superior endplate deformity of the L4 vertebral body. 2. Nonacute wedge deformity of L1 status post vertebral cement augmentation. 3. No fracture or dislocation of the thoracic spine. 4. No CT evidence of  acute intra-abdominal pathology. 5. Descending and sigmoid diverticulosis without evidence of acute diverticulitis. 6. Cardiomegaly and coronary artery disease. - Aortic Atherosclerosis (ICD10-I70.0).   EKG: 04/03/23: Normal sinus rhythm Normal ECG When compared with ECG of 17-Oct-2022 12:00, No significant change was found Confirmed by Carolan Clines [phone]) on 04/03/2023 10:37:52 AM   CV: MRI Cardiac 12/25/22: IMPRESSION: 1.  Normal LV size with EF 69%. 2. Severely dilated RV with normal systolic function, EF 58%. Mildly D-shaped interventricular septum suggestive of a degree of RV pressure/volume overload. 3. There was mid-wall LGE at the basal to mid inferoseptal RV insertion site. This is nonspecific and can be reflective of pressure/volume overload. 4. Moderate-severe tricuspid regurgitation with regurgitant fraction 36% (moderate) and regurgitant volume 65 cc (severe).    RHC 10/10/22: HEMODYNAMICS: RA:                  13 mmHg (mean) RV:                  41/10-15 mmHg PA:                  40/15 mmHg (21 mean) PCWP:            14 mmHg (mean)                                      Estimated Fick CO/CI   4.39 L/min, 1.9 L/min/m2 Thermodilution CO/CI   3.61 L/min, 1.5 L/min/m2                                            TPG                 7  mmHg                                              PVR                 1.6-1.94 Wood Units  PAPi                1.9     IMPRESSION: Severely elevated pre-capillary pressures.  Normal left sided filling pressures Normal PVR and PA mean.  Severely reduced cardiac index likely secondary to suboptimal RV function. (WRU:EAVW of 1, PAPi of 1.9. )   TEE 10/10/22: IMPRESSIONS   1. Left ventricular ejection fraction, by estimation, is 45 to 50%. The left ventricle has mildly decreased function.   2. Right ventricular systolic function is moderately reduced. The right ventricular size is severely enlarged. Moderately increased right ventricular  wall thickness.   3. Left atrial size was moderately dilated. No left atrial/left atrial appendage thrombus was detected.   4. Right atrial size was severely dilated.  5. The mitral valve is normal in structure. Trivial mitral valve regurgitation.   6. Tricuspid valve regurgitation is severe.   7. The aortic valve is normal in structure. Aortic valve regurgitation is not visualized.    TTE 07/27/22: IMPRESSIONS   1. Left ventricular ejection fraction, by estimation, is 50 to 55%. The left ventricle has low normal function. The left ventricle has no regional wall motion abnormalities. Left ventricular diastolic parameters are indeterminate.   2. Right ventricular systolic function is severely reduced. The right ventricular size is severely enlarged. There is moderately elevated pulmonary artery systolic pressure.   3. Left atrial size was mildly dilated.   4. Right atrial size was severely dilated.   5. The mitral valve is abnormal. Mild mitral valve regurgitation. No evidence of mitral stenosis.   6. Tricuspid valve regurgitation is severe.   7. The aortic valve is tricuspid. There is moderate calcification of the aortic valve. There is moderate thickening of the aortic valve. Aortic valve regurgitation is trivial. Aortic valve sclerosis/calcification is present, without any evidence of aortic stenosis.   8. The inferior vena cava is dilated in size with >50% respiratory variability, suggesting right atrial pressure of 8 mmHg.  - (Comparison: 02/05/20 [place]: EF 55%, mild-moderate TR, APS 61 mmHg, moderate RVH, borderline RV function; 11/05/18 EF 60-65%, mild MR, moderate TR, RVSP 56 mmHg; 06/26/18 RVSP 84 mmHg)    Cardiac cath 06/26/18: Ost RCA lesion is 30% stenosed. The left ventricular systolic function is normal. LV end diastolic pressure is mildly elevated. The left ventricular ejection fraction is 55-65% by visual estimate.   1.  Mild nonobstructive coronary artery disease. 2.   Normal LV systolic function and mildly elevated left ventricular end-diastolic pressure. - Recommendations: Elevated troponin most likely due to supply demand ischemia related to A. fib with RVR. Oral anticoagulation for A. fib can be started tomorrow.    Past Medical History:  Diagnosis Date   Adenomatous colon polyp    2012   Anxiety and depression    Arthritis    Osteoarthritis, Dr Despina Hick   [facility])    skin - left foot excised   Chronic diastolic CHF (congestive heart failure) (HCC)    DDD (degenerative disc disease), cervical    Deaf, right    DVT (deep venous thrombosis) (HCC)    RLE DVT 08/2004, post right knee arthroscopy   Dysrhythmia    A.Fib   Fibromyalgia    Dr Azzie Roup, Rheu   GERD (gastroesophageal reflux disease)    H/O hiatal hernia    Headache(784.0)    PAST HX MILD MIGRAINES - resolved per patient 08/18/20   History of kidney stones    passed stones   HTN (hypertension)    IBS (irritable bowel syndrome)    Internal hemorrhoid    Lumbar radiculopathy    Obesity    OSA on CPAP    Moderate with AHI 20/hr with nocturnal hypoxemia on CPAP   PAF (paroxysmal atrial fibrillation) (HCC) 06/2018   Pulmonary hypertension (HCC)     Past Surgical History:  Procedure Laterality Date   ABDOMINAL HYSTERECTOMY     dysfunctional menses   APPENDECTOMY     BREAST EXCISIONAL BIOPSY Right    CARDIAC CATHETERIZATION  06/26/2018   CARDIOVERSION N/A 10/10/2022   Procedure: CARDIOVERSION;  Surgeon: Dorthula Nettles, DO;  Location: [facility];  Service: Cardiovascular;  Laterality: N/A;   CHOLECYSTECTOMY     COLONOSCOPY  05/27/2002, 08/08/11   2003 diverticulosis, hemorrhoids  2012 same + cecal polyp   EYE SURGERY     KNEE ARTHROSCOPY  2008   right   KYPHOPLASTY Bilateral 08/19/2020   Procedure: KYPHOPLASTY LUMBAR ONE;  Surgeon: Bedelia Person, MD;  [facility] OR;  Service: Neurosurgery;  Laterality: Bilateral;  posterior   LEFT HEART CATH AND CORONARY  ANGIOGRAPHY N/A 06/26/2018   Procedure: LEFT HEART CATH AND CORONARY ANGIOGRAPHY;  Surgeon: Iran Ouch, MD;  Location: [facility] INVAS[facility] CV LAB;  Service: Cardiovascular;  Laterality: N/A;   MELANOMA EXCISION Left 07/2013   foot   RIGHT HEART CATH N/A 10/10/2022   Procedure: RIGHT HEART CATH;  Surgeon: Dorthula Nettles, DO;  [facility] IN[facility] CV LAB;  Service: Cardiovascular;  Laterality: N/A;   TEE WITHOUT CARDIOVERSION N/A 10/10/2022   Procedure: TRANSESOPHAGEAL ECHOCARDIOGRAM (TEE);  Surgeon: Dorthula Nettles, DO;  Location: [facility];  Service: Cardiovascular;  Laterality: N/A;   TOTAL HIP ARTHROPLASTY  02/2010   left ; Dr Despina Hick   TOTAL HIP ARTHROPLASTY  03/18/2012   Procedure: TOTAL HIP ARTHROPLASTY;  Surgeon: Loanne Drilling, MD;  Location: [facility] ORS;  Service: Orthopedics;  Laterality: Right;   UPPER GASTROINTESTINAL ENDOSCOPY  04/21/2009, 08/08/11   2010 gastritis ;2012 small hiatal hernia. Dr Leone Payor   WRIST FRACTURE SURGERY Left     MEDICATIONS:  ALPRAZolam (XANAX) 1 MG tablet   amiodarone (PACERONE) 200 MG tablet   atorvastatin (LIPITOR) 40 MG tablet   Calcium Carb-Cholecalciferol (CALCIUM 600 + D PO)   clindamycin (CLEOCIN T) 1 % lotion   clobetasol (TEMOVATE) 0.05 % external solution   cyclobenzaprine (FLEXERIL) 5 MG tablet   ELIQUIS 5 MG TABS tablet   fluocinonide (LIDEX) 0.05 % external solution   FLUoxetine (PROZAC) 40 MG capsule   furosemide (LASIX) 40 MG tablet   gabapentin (NEURONTIN) 300 MG capsule   methocarbamol (ROBAXIN) 750 MG tablet   metroNIDAZOLE (METROCREAM) 0.75 % cream   Multiple Vitamin (MULTIVITAMIN WITH MINERALS) TABS tablet   nitroGLYCERIN (NITROSTAT) 0.4 MG SL tablet   omeprazole (PRILOSEC) 40 MG capsule   potassium chloride (KLOR-CON) 10 MEQ tablet   Propylene Glycol (SYSTANE BALANCE) 0.6 % SOLN   sacubitril-valsartan (ENTRESTO) 49-51 MG   traMADol (ULTRAM) 50 MG tablet   No current facility-administered medications for this encounter.     Shonna Chock, PA-C Surgical Short Stay/Anesthesiology [facility] Phone [phone] [facility] Phone [phone] 07/25/2023 10:55 PM

## 2023-07-25 NOTE — Progress Notes (Signed)
Invalid surgical PCR result at pre-surgery appointment. Will need to be recollected DOS. Order placed.

## 2023-07-25 NOTE — Telephone Encounter (Signed)
Transition Care Management Unsuccessful Follow-up Telephone Call  Date of discharge and from where:  [facility] 10/6  Attempts:  1st Attempt  Reason for unsuccessful TCM follow-up call:  No answer/busy   [facility]  [facility] Guide, Phone: [phone] Website: [email or web address]

## 2023-07-26 ENCOUNTER — Telehealth: Payer: Self-pay

## 2023-07-26 NOTE — Telephone Encounter (Signed)
Transition Care Management Follow-up Telephone Call Date of discharge and from where: [facility] 10/7 How have you been since you were released from the hospital? Pt is following up with providers for surgery on Tuesday 11/12 Any questions or concerns? No  Items Reviewed: Did the pt receive and understand the discharge instructions provided? Yes  Medications obtained and verified? Yes  Other? No  Any new allergies since your discharge? No  Dietary orders reviewed? No Do you have support at home? Yes     Follow up appointments reviewed:  PCP Hospital f/u appt confirmed? No  Scheduled to see  on  @ . Specialist Hospital f/u appt confirmed? Yes  Scheduled to see  on  @ . Are transportation arrangements needed? No  If their condition worsens, is the pt aware to call PCP or go to the Emergency Dept.? Yes Was the patient provided with contact information for the PCP's office or ED? Yes Was to pt encouraged to call back with questions or concerns? Yes

## 2023-07-31 ENCOUNTER — Ambulatory Visit (HOSPITAL_COMMUNITY): Payer: Medicare Other | Admitting: Vascular Surgery

## 2023-07-31 ENCOUNTER — Ambulatory Visit (HOSPITAL_BASED_OUTPATIENT_CLINIC_OR_DEPARTMENT_OTHER): Payer: Medicare Other | Admitting: Registered Nurse

## 2023-07-31 ENCOUNTER — Ambulatory Visit (HOSPITAL_COMMUNITY)
Admission: RE | Admit: 2023-07-31 | Discharge: 2023-07-31 | Disposition: A | Discharge status: Home / Self Care | Payer: Medicare Other | Attending: Neurosurgery | Admitting: Neurosurgery

## 2023-07-31 ENCOUNTER — Encounter (HOSPITAL_COMMUNITY): Payer: Self-pay | Admitting: *Deleted

## 2023-07-31 ENCOUNTER — Other Ambulatory Visit: Payer: Self-pay

## 2023-07-31 ENCOUNTER — Ambulatory Visit (HOSPITAL_COMMUNITY): Payer: Medicare Other

## 2023-07-31 ENCOUNTER — Encounter (HOSPITAL_COMMUNITY)
Admission: RE | Disposition: A | Discharge status: Home / Self Care | Payer: Self-pay | Source: Home / Self Care | Attending: Neurosurgery

## 2023-07-31 DIAGNOSIS — K219 Gastro-esophageal reflux disease without esophagitis: Secondary | ICD-10-CM | POA: Insufficient documentation

## 2023-07-31 DIAGNOSIS — M1990 Unspecified osteoarthritis, unspecified site: Secondary | ICD-10-CM | POA: Insufficient documentation

## 2023-07-31 DIAGNOSIS — Z7901 Long term (current) use of anticoagulants: Secondary | ICD-10-CM | POA: Insufficient documentation

## 2023-07-31 DIAGNOSIS — Z86718 Personal history of other venous thrombosis and embolism: Secondary | ICD-10-CM | POA: Diagnosis not present

## 2023-07-31 DIAGNOSIS — M8008XA Age-related osteoporosis with current pathological fracture, vertebra(e), initial encounter for fracture: Secondary | ICD-10-CM

## 2023-07-31 DIAGNOSIS — I130 Hypertensive heart and chronic kidney disease with heart failure and stage 1 through stage 4 chronic kidney disease, or unspecified chronic kidney disease: Secondary | ICD-10-CM | POA: Diagnosis not present

## 2023-07-31 DIAGNOSIS — I5032 Chronic diastolic (congestive) heart failure: Secondary | ICD-10-CM | POA: Diagnosis not present

## 2023-07-31 DIAGNOSIS — Z01818 Encounter for other preprocedural examination: Secondary | ICD-10-CM

## 2023-07-31 DIAGNOSIS — Z6841 Body Mass Index (BMI) 40.0 and over, adult: Secondary | ICD-10-CM | POA: Insufficient documentation

## 2023-07-31 DIAGNOSIS — M797 Fibromyalgia: Secondary | ICD-10-CM | POA: Diagnosis not present

## 2023-07-31 DIAGNOSIS — N183 Chronic kidney disease, stage 3 unspecified: Secondary | ICD-10-CM | POA: Diagnosis not present

## 2023-07-31 DIAGNOSIS — I071 Rheumatic tricuspid insufficiency: Secondary | ICD-10-CM | POA: Insufficient documentation

## 2023-07-31 DIAGNOSIS — I4819 Other persistent atrial fibrillation: Secondary | ICD-10-CM | POA: Insufficient documentation

## 2023-07-31 DIAGNOSIS — I480 Paroxysmal atrial fibrillation: Secondary | ICD-10-CM | POA: Diagnosis not present

## 2023-07-31 DIAGNOSIS — G4733 Obstructive sleep apnea (adult) (pediatric): Secondary | ICD-10-CM | POA: Insufficient documentation

## 2023-07-31 HISTORY — PX: KYPHOPLASTY: SHX5884

## 2023-07-31 LAB — SURGICAL PCR SCREEN
MRSA, PCR: NEGATIVE
Staphylococcus aureus: NEGATIVE

## 2023-07-31 SURGERY — KYPHOPLASTY
Anesthesia: General

## 2023-07-31 MED ORDER — CHLORHEXIDINE GLUCONATE 0.12 % MT SOLN
OROMUCOSAL | Status: AC
  Administered 2023-07-31: 15 mL via OROMUCOSAL
  Filled 2023-07-31: qty 15

## 2023-07-31 MED ORDER — VANCOMYCIN HCL 1500 MG/300ML IV SOLN: 1500.0000 mg | INTRAVENOUS | Status: AC

## 2023-07-31 MED ORDER — DEXAMETHASONE SODIUM PHOSPHATE 10 MG/ML IJ SOLN
INTRAMUSCULAR | Status: AC
  Filled 2023-07-31: qty 1

## 2023-07-31 MED ORDER — SUGAMMADEX SODIUM 200 MG/2ML IV SOLN
INTRAVENOUS | Status: DC | PRN
  Administered 2023-07-31: 400 mg via INTRAVENOUS

## 2023-07-31 MED ORDER — ONDANSETRON HCL 4 MG/2ML IJ SOLN: 4.0000 mg | Freq: Once | INTRAMUSCULAR | Status: DC | PRN

## 2023-07-31 MED ORDER — 0.9 % SODIUM CHLORIDE (POUR BTL) OPTIME
TOPICAL | Status: DC | PRN
  Administered 2023-07-31: 1000 mL

## 2023-07-31 MED ORDER — PHENYLEPHRINE HCL-NACL 20-0.9 MG/250ML-% IV SOLN
INTRAVENOUS | Status: DC | PRN
  Administered 2023-07-31: 40 ug/min via INTRAVENOUS

## 2023-07-31 MED ORDER — EPHEDRINE SULFATE-NACL 50-0.9 MG/10ML-% IV SOSY
PREFILLED_SYRINGE | INTRAVENOUS | Status: DC | PRN
  Administered 2023-07-31 (×4): 5 mg via INTRAVENOUS

## 2023-07-31 MED ORDER — PROPOFOL 10 MG/ML IV BOLUS
INTRAVENOUS | Status: DC | PRN
  Administered 2023-07-31: 100 mg via INTRAVENOUS

## 2023-07-31 MED ORDER — DEXAMETHASONE SODIUM PHOSPHATE 10 MG/ML IJ SOLN
INTRAMUSCULAR | Status: DC | PRN
  Administered 2023-07-31: 10 mg via INTRAVENOUS

## 2023-07-31 MED ORDER — FENTANYL CITRATE (PF) 250 MCG/5ML IJ SOLN
INTRAMUSCULAR | Status: AC
  Filled 2023-07-31: qty 5

## 2023-07-31 MED ORDER — DEXAMETHASONE SODIUM PHOSPHATE 10 MG/ML IJ SOLN
INTRAMUSCULAR | Status: AC
  Administered 2023-07-31: 10 mg
  Filled 2023-07-31: qty 1

## 2023-07-31 MED ORDER — FENTANYL CITRATE (PF) 100 MCG/2ML IJ SOLN
INTRAMUSCULAR | Status: AC
  Filled 2023-07-31: qty 2

## 2023-07-31 MED ORDER — FENTANYL CITRATE (PF) 250 MCG/5ML IJ SOLN
INTRAMUSCULAR | Status: DC | PRN
  Administered 2023-07-31 (×3): 50 ug via INTRAVENOUS

## 2023-07-31 MED ORDER — ACETAMINOPHEN 10 MG/ML IV SOLN
1000.0000 mg | Freq: Once | INTRAVENOUS | Status: DC | PRN
  Administered 2023-07-31: 1000 mg via INTRAVENOUS

## 2023-07-31 MED ORDER — PHENYLEPHRINE 80 MCG/ML (10ML) SYRINGE FOR IV PUSH (FOR BLOOD PRESSURE SUPPORT)
PREFILLED_SYRINGE | INTRAVENOUS | Status: AC
  Filled 2023-07-31: qty 10

## 2023-07-31 MED ORDER — CHLORHEXIDINE GLUCONATE 0.12 % MT SOLN: 15.0000 mL | Freq: Once | OROMUCOSAL | Status: AC

## 2023-07-31 MED ORDER — ACETAMINOPHEN 10 MG/ML IV SOLN
INTRAVENOUS | Status: AC
  Filled 2023-07-31: qty 100

## 2023-07-31 MED ORDER — IOHEXOL 300 MG/ML  SOLN
INTRAMUSCULAR | Status: DC | PRN
  Administered 2023-07-31: 100 mL

## 2023-07-31 MED ORDER — LACTATED RINGERS IV SOLN: INTRAVENOUS | Status: DC

## 2023-07-31 MED ORDER — TRAMADOL HCL 50 MG PO TABS
50.0000 mg | ORAL_TABLET | Freq: Once | ORAL | Status: AC
Start: 2023-07-31 — End: 2023-07-31
  Administered 2023-07-31: 50 mg via ORAL

## 2023-07-31 MED ORDER — CHLORHEXIDINE GLUCONATE CLOTH 2 % EX PADS: 6.0000 | MEDICATED_PAD | Freq: Once | CUTANEOUS | Status: DC

## 2023-07-31 MED ORDER — LIDOCAINE-EPINEPHRINE 1 %-1:100000 IJ SOLN
INTRAMUSCULAR | Status: DC | PRN
  Administered 2023-07-31: 5 mL

## 2023-07-31 MED ORDER — MIDAZOLAM HCL 2 MG/2ML IJ SOLN
INTRAMUSCULAR | Status: AC
Start: 2023-07-31 — End: ?
  Filled 2023-07-31: qty 2

## 2023-07-31 MED ORDER — ORAL CARE MOUTH RINSE: 15.0000 mL | Freq: Once | OROMUCOSAL | Status: AC

## 2023-07-31 MED ORDER — ROCURONIUM BROMIDE 10 MG/ML (PF) SYRINGE
PREFILLED_SYRINGE | INTRAVENOUS | Status: DC | PRN
  Administered 2023-07-31: 100 mg via INTRAVENOUS

## 2023-07-31 MED ORDER — OXYCODONE HCL 5 MG PO TABS: 5.0000 mg | ORAL_TABLET | Freq: Once | ORAL | Status: DC | PRN

## 2023-07-31 MED ORDER — EPHEDRINE 5 MG/ML INJ
INTRAVENOUS | Status: AC
  Filled 2023-07-31: qty 5

## 2023-07-31 MED ORDER — BUPIVACAINE HCL (PF) 0.5 % IJ SOLN
INTRAMUSCULAR | Status: DC | PRN
  Administered 2023-07-31: 5 mL

## 2023-07-31 MED ORDER — RACEPINEPHRINE HCL 2.25 % IN NEBU
INHALATION_SOLUTION | RESPIRATORY_TRACT | Status: AC
  Administered 2023-07-31: 0.5 mL
  Filled 2023-07-31: qty 0.5

## 2023-07-31 MED ORDER — MIDAZOLAM HCL 2 MG/2ML IJ SOLN
INTRAMUSCULAR | Status: DC | PRN
  Administered 2023-07-31: 1 mg via INTRAVENOUS

## 2023-07-31 MED ORDER — BUPIVACAINE HCL (PF) 0.5 % IJ SOLN
INTRAMUSCULAR | Status: AC
  Filled 2023-07-31: qty 30

## 2023-07-31 MED ORDER — VANCOMYCIN HCL 1500 MG/300ML IV SOLN
INTRAVENOUS | Status: AC
  Administered 2023-07-31: 1500 mg via INTRAVENOUS
  Filled 2023-07-31: qty 300

## 2023-07-31 MED ORDER — PHENYLEPHRINE 80 MCG/ML (10ML) SYRINGE FOR IV PUSH (FOR BLOOD PRESSURE SUPPORT)
PREFILLED_SYRINGE | INTRAVENOUS | Status: DC | PRN
  Administered 2023-07-31 (×2): 80 ug via INTRAVENOUS

## 2023-07-31 MED ORDER — ONDANSETRON HCL 4 MG/2ML IJ SOLN
INTRAMUSCULAR | Status: AC
  Filled 2023-07-31: qty 2

## 2023-07-31 MED ORDER — TRAMADOL HCL 50 MG PO TABS: 50.0000 mg | ORAL_TABLET | Freq: Four times a day (QID) | ORAL | 0 refills | Status: DC | PRN

## 2023-07-31 MED ORDER — TRAMADOL HCL 50 MG PO TABS
ORAL_TABLET | ORAL | Status: AC
  Filled 2023-07-31: qty 1

## 2023-07-31 MED ORDER — PROPOFOL 10 MG/ML IV BOLUS
INTRAVENOUS | Status: AC
  Filled 2023-07-31: qty 20

## 2023-07-31 MED ORDER — RACEPINEPHRINE HCL 2.25 % IN NEBU
INHALATION_SOLUTION | RESPIRATORY_TRACT | Status: AC
  Filled 2023-07-31: qty 0.5

## 2023-07-31 MED ORDER — OXYCODONE HCL 5 MG/5ML PO SOLN: 5.0000 mg | Freq: Once | ORAL | Status: DC | PRN

## 2023-07-31 MED ORDER — LIDOCAINE 2% (20 MG/ML) 5 ML SYRINGE
INTRAMUSCULAR | Status: AC
  Filled 2023-07-31: qty 5

## 2023-07-31 MED ORDER — FENTANYL CITRATE (PF) 100 MCG/2ML IJ SOLN
25.0000 ug | INTRAMUSCULAR | Status: DC | PRN
Start: 2023-07-31 — End: 2023-07-31
  Administered 2023-07-31: 50 ug via INTRAVENOUS

## 2023-07-31 MED ORDER — ONDANSETRON HCL 4 MG/2ML IJ SOLN
INTRAMUSCULAR | Status: DC | PRN
  Administered 2023-07-31: 4 mg via INTRAVENOUS

## 2023-07-31 MED ORDER — LIDOCAINE-EPINEPHRINE 1 %-1:100000 IJ SOLN
INTRAMUSCULAR | Status: AC
  Filled 2023-07-31: qty 1

## 2023-07-31 MED ORDER — ROCURONIUM BROMIDE 10 MG/ML (PF) SYRINGE
PREFILLED_SYRINGE | INTRAVENOUS | Status: AC
  Filled 2023-07-31: qty 10

## 2023-07-31 MED ORDER — LIDOCAINE 2% (20 MG/ML) 5 ML SYRINGE
INTRAMUSCULAR | Status: DC | PRN
  Administered 2023-07-31: 100 mg via INTRAVENOUS

## 2023-07-31 SURGICAL SUPPLY — 38 items
ADH SKN CLS APL DERMABOND .7 (GAUZE/BANDAGES/DRESSINGS) ×1
BAG COUNTER SPONGE SURGICOUNT (BAG) ×1 IMPLANT
BAG SPNG CNTER NS LX DISP (BAG) ×1
BLADE CLIPPER SURG (BLADE) IMPLANT
CEMENT KYPHON C01A KIT/MIXER (Cement) IMPLANT
DERMABOND ADVANCED .7 DNX12 (GAUZE/BANDAGES/DRESSINGS) IMPLANT
DRAPE C-ARM 42X72 X-RAY (DRAPES) ×1 IMPLANT
DRAPE INCISE IOBAN 66X45 STRL (DRAPES) ×1 IMPLANT
DRAPE LAPAROTOMY 100X72X124 (DRAPES) ×1 IMPLANT
DRAPE SURG 17X23 STRL (DRAPES) ×2 IMPLANT
DRAPE WARM FLUID 44X44 (DRAPES) IMPLANT
DURAPREP 26ML APPLICATOR (WOUND CARE) ×1 IMPLANT
GAUZE 4X4 16PLY ~~LOC~~+RFID DBL (SPONGE) IMPLANT
GLOVE BIO SURGEON STRL SZ7 (GLOVE) ×2 IMPLANT
GLOVE BIOGEL PI IND STRL 7.5 (GLOVE) ×2 IMPLANT
GLOVE ECLIPSE 7.5 STRL STRAW (GLOVE) ×2 IMPLANT
GOWN STRL REUS W/ TWL LRG LVL3 (GOWN DISPOSABLE) ×1 IMPLANT
GOWN STRL REUS W/ TWL XL LVL3 (GOWN DISPOSABLE) ×2 IMPLANT
GOWN STRL REUS W/TWL 2XL LVL3 (GOWN DISPOSABLE) IMPLANT
GOWN STRL REUS W/TWL LRG LVL3 (GOWN DISPOSABLE) ×1
GOWN STRL REUS W/TWL XL LVL3 (GOWN DISPOSABLE) ×1
INTRODUCER DEVICE OSTEO LEVEL (INTRODUCER) IMPLANT
KIT BASIN OR (CUSTOM PROCEDURE TRAY) ×1 IMPLANT
KIT TURNOVER KIT B (KITS) ×2 IMPLANT
NDL HYPO 22X1.5 SAFETY MO (MISCELLANEOUS) ×2 IMPLANT
NEEDLE HYPO 22X1.5 SAFETY MO (MISCELLANEOUS) ×1 IMPLANT
NS IRRIG 1000ML POUR BTL (IV SOLUTION) ×1 IMPLANT
PACK BASIC III (CUSTOM PROCEDURE TRAY) ×1
PACK SRG BSC III STRL LF ECLPS (CUSTOM PROCEDURE TRAY) ×2 IMPLANT
PAD ARMBOARD 7.5X6 YLW CONV (MISCELLANEOUS) ×3 IMPLANT
SPIKE FLUID TRANSFER (MISCELLANEOUS) ×1 IMPLANT
SUT MNCRL AB 4-0 PS2 18 (SUTURE) IMPLANT
SUT VIC AB 3-0 SH 8-18 (SUTURE) ×2 IMPLANT
SUT VIC AB 4-0 PS2 18 (SUTURE) IMPLANT
TOWEL GREEN STERILE (TOWEL DISPOSABLE) ×1 IMPLANT
TOWEL GREEN STERILE FF (TOWEL DISPOSABLE) ×1 IMPLANT
TRAY KYPHOPAK 15/3 ONESTEP 1ST (MISCELLANEOUS) IMPLANT
WATER STERILE IRR 1000ML POUR (IV SOLUTION) ×1 IMPLANT

## 2023-07-31 NOTE — Progress Notes (Signed)
50 mcg Fentanyl wasted with Burna Forts RN

## 2023-07-31 NOTE — Anesthesia Procedure Notes (Signed)
Procedure Name: Intubation Date/Time: 07/31/2023 8:11 AM  Performed by: Sharyn Dross, CRNAPre-anesthesia Checklist: Patient identified, Emergency Drugs available, Suction available and Patient being monitored Patient Re-evaluated:Patient Re-evaluated prior to induction Oxygen Delivery Method: Circle system utilized Preoxygenation: Pre-oxygenation with 100% oxygen Induction Type: IV induction Ventilation: Mask ventilation without difficulty and Oral airway inserted - appropriate to patient size Laryngoscope Size: Mac and 4 Grade View: Grade III Tube type: Oral Tube size: 7.0 mm Number of attempts: 1 Airway Equipment and Method: Stylet and Oral airway Placement Confirmation: ETT inserted through vocal cords under direct vision, positive ETCO2 and breath sounds checked- equal and bilateral Secured at: 22 cm Tube secured with: Tape Dental Injury: Teeth and Oropharynx as per pre-operative assessment

## 2023-07-31 NOTE — H&P (Signed)
CC: back pai  HPI:     Patient is a 72 y.o. female with hx of kyphoplasty presents for kyphoplasty for new osteoporotic compression fracture.  Reports no new symptoms, continued mechanical back pain limiting her activity and QOL.    Patient Active Problem List   Diagnosis Date Noted   Vitamin D deficiency 11/15/2022   Acute on chronic diastolic (congestive) heart failure (HCC) 07/26/2022   RUQ abdominal pain 07/26/2022   Dizziness 07/21/2022   Chronic pain disorder 07/21/2022   CKD (chronic kidney disease) stage 3, GFR 30-59 ml/min (HCC) 10/12/2021   Chronic anticoagulation 08/30/2021   OSA on CPAP    Depression, recurrent (HCC) 05/06/2019   Persistent atrial fibrillation (HCC) 12/31/2018   Chronic diastolic CHF (congestive heart failure) (HCC) 12/31/2018   Bilateral leg edema 07/29/2018   Paroxysmal atrial fibrillation (HCC) 06/25/2018   Chest pain 06/25/2018   Anxiety and depression 06/25/2018   Difficulty walking 02/13/2017   Low back pain 02/13/2017   Allergic rhinitis 07/31/2016   Malignant melanoma (HCC) 08/31/2013   Other abnormal glucose 01/22/2013   Anemia, unspecified 01/22/2013   OA (osteoarthritis) of hip 03/18/2012   Personal history of DVT (deep vein thrombosis) 01/05/2012   History of colonic polyps 08/08/2011   ARTHRITIS, HIP 01/24/2010   PERSONAL HISTORY OF FAILED MODERATE SEDATION 07/21/2009   Morbid obesity (HCC) 04/05/2009   Esophageal reflux 02/25/2009   HEART VALVE DISEASE 12/28/2008   HEADACHE 04/21/2008   Essential hypertension 10/15/2007   LUMBAR RADICULOPATHY, RIGHT 10/15/2007   FATIGUE 10/15/2007   IRRITABLE BOWEL SYNDROME 07/23/2007   Fibromyalgia 07/23/2007   Past Medical History:  Diagnosis Date   Adenomatous colon polyp    2012   Anxiety and depression    Arthritis    Osteoarthritis, Dr Despina Hick   Cancer [facility])    skin - left foot excised   Chronic diastolic CHF (congestive heart failure) (HCC)    DDD (degenerative disc disease),  cervical    Deaf, right    DVT (deep venous thrombosis) (HCC)    RLE DVT 08/2004, post right knee arthroscopy   Dysrhythmia    A.Fib   Fibromyalgia    Dr Azzie Roup, Rheu   GERD (gastroesophageal reflux disease)    H/O hiatal hernia    Headache(784.0)    PAST HX MILD MIGRAINES - resolved per patient 08/18/20   History of kidney stones    passed stones   HTN (hypertension)    IBS (irritable bowel syndrome)    Internal hemorrhoid    Lumbar radiculopathy    Obesity    OSA on CPAP    Moderate with AHI 20/hr with nocturnal hypoxemia on CPAP   PAF (paroxysmal atrial fibrillation) (HCC) 06/2018   Pulmonary hypertension (HCC)     Past Surgical History:  Procedure Laterality Date   ABDOMINAL HYSTERECTOMY     dysfunctional menses   APPENDECTOMY     BREAST EXCISIONAL BIOPSY Right    CARDIAC CATHETERIZATION  06/26/2018   CARDIOVERSION N/A 10/10/2022   Procedure: CARDIOVERSION;  Surgeon: Dorthula Nettles, DO;  Location: [facility];  Service: Cardiovascular;  Laterality: N/A;   CHOLECYSTECTOMY     COLONOSCOPY  05/27/2002, 08/08/11   2003 diverticulosis, hemorrhoids 2012 same + cecal polyp   EYE SURGERY     KNEE ARTHROSCOPY  2008   right   KYPHOPLASTY Bilateral 08/19/2020   Procedure: KYPHOPLASTY LUMBAR ONE;  Surgeon: Bedelia Person, MD;  Location: [facility] OR;  Service: Neurosurgery;  Laterality: Bilateral;  posterior  LEFT HEART CATH AND CORONARY ANGIOGRAPHY N/A 06/26/2018   Procedure: LEFT HEART CATH AND CORONARY ANGIOGRAPHY;  Surgeon: Iran Ouch, MD;  [facility]: [facility];  Service: Cardiovascular;  Laterality: N/A;   MELANOMA EXCISION Left 07/2013   foot   RIGHT HEART CATH N/A 10/10/2022   Procedure: RIGHT HEART CATH;  Surgeon: Dorthula Nettles, DO;  [facility]: [facility];  Service: Cardiovascular;  Laterality: N/A;   TEE WITHOUT CARDIOVERSION N/A 10/10/2022   Procedure: TRANSESOPHAGEAL ECHOCARDIOGRAM (TEE);  Surgeon: Dorthula Nettles, DO;  Location:  [facility];  Service: Cardiovascular;  Laterality: N/A;   TOTAL HIP ARTHROPLASTY  02/2010   left ; Dr Despina Hick   TOTAL HIP ARTHROPLASTY  03/18/2012   Procedure: TOTAL HIP ARTHROPLASTY;  Surgeon: Loanne Drilling, MD;  Location: [facility] ORS;  Service: Orthopedics;  Laterality: Right;   UPPER GASTROINTESTINAL ENDOSCOPY  04/21/2009, 08/08/11   2010 gastritis ;2012 small hiatal hernia. Dr Leone Payor   WRIST FRACTURE SURGERY Left     Medications Prior to Admission  Medication Sig Dispense Refill Last Dose   ALPRAZolam (XANAX) 1 MG tablet Take 1 tablet (1 mg total) by mouth 3 (three) times daily as needed for anxiety. 90 tablet 0 07/31/2023 at 0430   amiodarone (PACERONE) 200 MG tablet Take 1 tablet (200 mg total) by mouth daily. 90 tablet 3 07/31/2023 at 0430   atorvastatin (LIPITOR) 40 MG tablet TAKE 1 TABLET BY MOUTH EVERY DAY 90 tablet 3 07/30/2023   Calcium Carb-Cholecalciferol (CALCIUM 600 + D PO) Take 600 mg by mouth daily.   07/30/2023   ELIQUIS 5 MG TABS tablet TAKE 1 TABLET BY MOUTH TWICE A DAY 180 tablet 3 07/27/2023   FLUoxetine (PROZAC) 40 MG capsule TAKE 1 CAPSULE BY MOUTH EVERY DAY 90 capsule 1 07/30/2023   furosemide (LASIX) 40 MG tablet Take 1 tablet (40 mg total) by mouth daily. 90 tablet 1 07/30/2023   gabapentin (NEURONTIN) 300 MG capsule Take 1 capsule (300 mg total) by mouth at bedtime. 90 capsule 1 07/30/2023   methocarbamol (ROBAXIN) 750 MG tablet Take 750 mg by mouth 3 (three) times daily as needed for muscle spasms.   07/30/2023   Multiple Vitamin (MULTIVITAMIN WITH MINERALS) TABS tablet Take 1 tablet by mouth daily. Centrum Silver   07/30/2023   nitroGLYCERIN (NITROSTAT) 0.4 MG SL tablet Place 1 tablet (0.4 mg total) under the tongue every 5 (five) minutes as needed for chest pain. 90 tablet 3    omeprazole (PRILOSEC) 40 MG capsule TAKE 1 CAPSULE BY MOUTH EVERY DAY 90 capsule 1 07/31/2023 at 0430   potassium chloride (KLOR-CON) 10 MEQ tablet Take 2 tablets (20 mEq total) by mouth  daily. 180 tablet 3 07/30/2023   sacubitril-valsartan (ENTRESTO) 49-51 MG Take 1 tablet by mouth 2 (two) times daily. 60 tablet 11 07/30/2023   traMADol (ULTRAM) 50 MG tablet Take 1 tablet (50 mg total) by mouth every 8 (eight) hours as needed. 15 tablet 0 07/30/2023   clindamycin (CLEOCIN T) 1 % lotion Apply 1 application  topically 2 (two) times daily as needed (itching/rash).   More than a month   clobetasol (TEMOVATE) 0.05 % external solution Apply 1 application  topically daily as needed (Rosacea).   More than a month   cyclobenzaprine (FLEXERIL) 5 MG tablet TAKE 1 TABLET BY MOUTH AT BEDTIME AS NEEDED FOR MUSCLE SPASMS. 30 tablet 0 More than a month   fluocinonide (LIDEX) 0.05 % external solution Apply 1 application topically daily as needed (rosacea).  More than a month   metroNIDAZOLE (METROCREAM) 0.75 % cream Apply 1 application topically 2 (two) times daily as needed (rosacea).    More than a month   Propylene Glycol (SYSTANE BALANCE) 0.6 % SOLN Place 1 drop into both eyes daily as needed (Dry eyes).   More than a month   Allergies  Allergen Reactions   Achromycin [Tetracycline Hcl] Shortness Of Breath    Dyspnea     Penicillins     Facial angioedema Did it involve swelling of the face/tongue/throat, SOB, or low BP? Yes Did it involve sudden or severe rash/hives, skin peeling, or any reaction on the inside of your mouth or nose? No Did you need to seek medical attention at a hospital or doctor's office? No When did it last happen?      30+ years If all above answers are "NO", may proceed with cephalosporin use.    Sulfamethoxazole-Trimethoprim Hives   Nickel Itching   Oxycodone Itching   Zanaflex [Tizanidine Hydrochloride] Other (See Comments)    Causes more pain     Social History   Tobacco Use   Smoking status: Never   Smokeless tobacco: Never  Substance Use Topics   Alcohol use: Yes    Comment: occasional    Family History  Problem Relation Age of Onset   Heart  attack Mother 8   COPD Mother    Breast cancer Mother 79   Hypertension Father    Diabetes Father    Heart failure Father 61   Breast cancer Paternal Aunt        cns mets   COPD Maternal Grandmother    Heart disease Paternal Grandmother        died 62   Diabetes Paternal Grandmother    Stroke Paternal Grandfather        in late 22s   Heart attack Brother 66   Colon cancer Neg Hx      Review of Systems Pertinent items are noted in HPI.  Objective:   Patient Vitals for the past 8 hrs:  BP Temp Temp src Pulse Resp SpO2 Height Weight  07/31/23 0615 (!) 175/62 (!) 97.5 F (36.4 C) Oral (!) 53 16 95 % 5\' 6"  (1.676 m) 122.9 kg   No intake/output data recorded. No intake/output data recorded.      General : Alert, cooperative, no distress, appears stated age   Head:  Normocephalic/atraumatic    Eyes: PERRL, conjunctiva/corneas clear, EOM's intact. Fundi could not be visualized Neck: Supple Chest:  Respirations unlabored Chest wall: no tenderness or deformity Heart: Regular rate and rhythm Abdomen: Soft, nontender and nondistended Extremities: warm and well-perfused Skin: normal turgor, color and texture Neurologic:  Alert, oriented x 3.  Eyes open spontaneously. PERRL, EOMI, VFC, no facial droop. V1-3 intact.  No dysarthria, tongue protrusion symmetric.  CNII-XII intact. Normal strength, sensation and reflexes throughout.  No pronator drift, full strength in legs       Data ReviewCBC:  Lab Results  Component Value Date   WBC 4.4 07/24/2023   RBC 4.49 07/24/2023   BMP:  Lab Results  Component Value Date   GLUCOSE 108 (H) 07/24/2023   CO2 28 07/24/2023   BUN 27 (H) 07/24/2023   BUN 19 09/05/2022   CREATININE 1.35 (H) 07/24/2023   CREATININE 0.85 12/01/2014   CALCIUM 9.4 07/24/2023   Radiology review:  See clinic note for details  Assessment:   Active Problems:   * No active hospital problems. *  Lumbar compression fracture Plan:   - kyphoplasty  today Risks, benefits, alternatives, and expected convalescence were discussed with her.  Risks discussed included, but were not limited to bleeding, pain, infection, scar, neurologic deficit, extravasation, extrusion, embolization,, damage to nearby organs, and death.  Informed consent was obtained.

## 2023-07-31 NOTE — Anesthesia Postprocedure Evaluation (Signed)
Anesthesia Post Note  Patient: Kathy [name]  Procedure(s) Performed: KYPHOPLASTY LUMBAR FOUR     Patient location during evaluation: PACU Anesthesia Type: General Level of consciousness: awake and alert Pain management: pain level controlled Vital Signs Assessment: post-procedure vital signs reviewed and stable Respiratory status: spontaneous breathing, nonlabored ventilation, respiratory function stable and patient connected to nasal cannula oxygen Cardiovascular status: blood pressure returned to baseline and stable Postop Assessment: no apparent nausea or vomiting Anesthetic complications: no Comments: Initially with inspiratory stridor. Treated with additional IV dexamethasone, nebulized racemic epinephrine, and then humidified O2 with improvement. Now reports breathing unlabored, still some sore throat. Discussed return precautions. Stable for discharge.     There were no known notable events for this encounter.  Last Vitals:  Vitals:   07/31/23 1145 07/31/23 1200  BP: (!) 118/47 (!) 118/54  Pulse:  (!) 53  Resp: 13 16  Temp:    SpO2:  94%    Last Pain:  Vitals:   07/31/23 0945  TempSrc:   PainSc: 8                  Mariann Barter

## 2023-07-31 NOTE — Op Note (Signed)
PREOP DIAGNOSIS: L4 osteoporotic compression fracture  POSTOP DIAGNOSIS: L4 osteoporotic compression fracture  PROCEDURE: 1. L4 Kyphoplasty  SURGEON: Dr. Hoyt Koch, MD  ASSISTANT: None  ANESTHESIA: GETA  EBL: Minimal  SPECIMENS: None  DRAINS: None  COMPLICATIONS: None immediate  CONDITION: Hemodynamically stable to PCAU  HISTORY: Kathy [name] is a 72 y.o. yo female with hx of L1 kyphoplasty who had a recent fall and developed severe mechanical back pain refractory to medical and physical therapy.  She wished to proceed with kyphoplasty of her acute L4 compression fracture.  PROCEDURE IN DETAIL: After informed consent was obtained and witnessed, the patient was brought to the operating room. After induction of anesthesia, the patient was positioned on the operative table in the prone position. All pressure points were meticulously padded. Fluoroscopy was used to mark out the projection of the L4 pedicles on the skin. Skin incision was then marked out and prepped and draped in the usual sterile fashion.  After time out was conducted, bilateral stab incisions were made and Jamshidi needles were introduced. Under AP and lateral fluoroscopic guidance, bilateral L4 pedicles were canulated. Drill was then used to create a channel and the kyphoplasty balloon was placed and expanded to create a cavity. Approximately 2 ml of PMMA cement was injected in each side under fluoroscopy, taking care to preserve the posterior cortex. The balloons were then removed, replaced with the trocars, and the Jamshidi needles removed.  Stab incisions were then closed with 3-0 vicryl suture and standard skin glue. The patient was then transferred to the stretcher and taken to the PACU in stable hemodynamic condition.  At the end of the case all sponge, needle, and instrument counts were correct.

## 2023-07-31 NOTE — Discharge Instructions (Addendum)
Resume eliquis on 11/14 No lifting > 10 lbs, no repetitive forceful bending or twisting at the waist Ok to shower now, do not rub back

## 2023-07-31 NOTE — Transfer of Care (Signed)
Immediate Anesthesia Transfer of Care Note  Patient: Kathy [name]  Procedure(s) Performed: KYPHOPLASTY LUMBAR FOUR  Patient Location: PACU  Anesthesia Type:General  Level of Consciousness: awake, alert , and oriented  Airway & Oxygen Therapy: Patient Spontanous Breathing  Post-op Assessment: Report given to RN and Post -op Vital signs reviewed and stable  Post vital signs: Reviewed and stable  Last Vitals:  Vitals Value Taken Time  BP 117/83 07/31/23 0921  Temp    Pulse 68 07/31/23 0922  Resp 13 07/31/23 0922  SpO2 91 % 07/31/23 0922  Vitals shown include unfiled device data.  Last Pain:  Vitals:   07/31/23 0615  TempSrc: Oral  PainSc:       Patients Stated Pain Goal: 2 (07/31/23 0865)  Complications: There were no known notable events for this encounter.

## 2023-08-01 ENCOUNTER — Encounter (HOSPITAL_COMMUNITY): Payer: Self-pay | Admitting: Neurosurgery

## 2023-08-02 ENCOUNTER — Other Ambulatory Visit: Payer: Self-pay | Admitting: Internal Medicine

## 2023-08-02 DIAGNOSIS — F419 Anxiety disorder, unspecified: Secondary | ICD-10-CM

## 2023-08-29 ENCOUNTER — Ambulatory Visit: Payer: Medicare Other | Admitting: Internal Medicine

## 2023-08-29 ENCOUNTER — Encounter: Payer: Self-pay | Admitting: Internal Medicine

## 2023-08-29 VITALS — BP 130/80 | HR 59 | Temp 97.6°F | Wt 283.6 lb

## 2023-08-29 DIAGNOSIS — Z23 Encounter for immunization: Secondary | ICD-10-CM

## 2023-08-29 DIAGNOSIS — M62838 Other muscle spasm: Secondary | ICD-10-CM | POA: Diagnosis not present

## 2023-08-29 NOTE — Progress Notes (Signed)
Established Patient Office Visit     CC/Reason for Visit: Right thoracic pain  HPI: Kathy [name] is a 73 y.o. female who is coming in today for the above mentioned reasons.  She and L4 kyphoplasty in November following a fall and is recovering well from stop past week she bent over and strained her right lateral thoracic chest wall over her lift chair and has been having pain ever since.  Requesting flu vaccine.   Past Medical/Surgical History: Past Medical History:  Diagnosis Date   Adenomatous colon polyp    2012   Anxiety and depression    Arthritis    Osteoarthritis, Dr Despina Hick   Cancer [facility])    skin - left foot excised   Chronic diastolic CHF (congestive heart failure) (HCC)    DDD (degenerative disc disease), cervical    Deaf, right    DVT (deep venous thrombosis) (HCC)    RLE DVT 08/2004, post right knee arthroscopy   Dysrhythmia    A.Fib   Fibromyalgia    Dr Azzie Roup, Rheu   GERD (gastroesophageal reflux disease)    H/O hiatal hernia    Headache(784.0)    PAST HX MILD MIGRAINES - resolved per patient 08/18/20   History of kidney stones    passed stones   HTN (hypertension)    IBS (irritable bowel syndrome)    Internal hemorrhoid    Lumbar radiculopathy    Obesity    OSA on CPAP    Moderate with AHI 20/hr with nocturnal hypoxemia on CPAP   PAF (paroxysmal atrial fibrillation) (HCC) 06/2018   Pulmonary hypertension (HCC)     Past Surgical History:  Procedure Laterality Date   ABDOMINAL HYSTERECTOMY     dysfunctional menses   APPENDECTOMY     BREAST EXCISIONAL BIOPSY Right    CARDIAC CATHETERIZATION  06/26/2018   CARDIOVERSION N/A 10/10/2022   Procedure: CARDIOVERSION;  Surgeon: Dorthula Nettles, DO;  Location: [facility];  Service: Cardiovascular;  Laterality: N/A;   CHOLECYSTECTOMY     COLONOSCOPY  05/27/2002, 08/08/11   2003 diverticulosis, hemorrhoids 2012 same + cecal polyp   EYE SURGERY     KNEE ARTHROSCOPY  2008   right    KYPHOPLASTY Bilateral 08/19/2020   Procedure: KYPHOPLASTY LUMBAR ONE;  Surgeon: Bedelia Person, MD;  Location: [facility] OR;  Service: Neurosurgery;  Laterality: Bilateral;  posterior   KYPHOPLASTY N/A 07/31/2023   Procedure: KYPHOPLASTY LUMBAR FOUR;  Surgeon: Bedelia Person, MD;  Location: [facility] OR;  Service: Neurosurgery;  Laterality: N/A;   LEFT HEART CATH AND CORONARY ANGIOGRAPHY N/A 06/26/2018   Procedure: LEFT HEART CATH AND CORONARY ANGIOGRAPHY;  Surgeon: Iran Ouch, MD;  [facility];  Service: Cardiovascular;  Laterality: N/A;   MELANOMA EXCISION Left 07/2013   foot   RIGHT HEART CATH N/A 10/10/2022   Procedure: RIGHT HEART CATH;  Surgeon: Dorthula Nettles, DO;  [facility]: [facility];  Service: Cardiovascular;  Laterality: N/A;   TEE WITHOUT CARDIOVERSION N/A 10/10/2022   Procedure: TRANSESOPHAGEAL ECHOCARDIOGRAM (TEE);  Surgeon: Dorthula Nettles, DO;  Location: [facility];  Service: Cardiovascular;  Laterality: N/A;   TOTAL HIP ARTHROPLASTY  02/2010   left ; Dr Despina Hick   TOTAL HIP ARTHROPLASTY  03/18/2012   Procedure: TOTAL HIP ARTHROPLASTY;  Surgeon: Loanne Drilling, MD;  Location: [facility];  Service: Orthopedics;  Laterality: Right;   UPPER GASTROINTESTINAL ENDOSCOPY  04/21/2009, 08/08/11   2010 gastritis ;2012 small hiatal hernia. Dr Leone Payor  WRIST FRACTURE SURGERY Left     Social History:  reports that she has never smoked. She has never used smokeless tobacco. She reports current alcohol use. She reports that she does not use drugs.  Allergies: Allergies  Allergen Reactions   Achromycin [Tetracycline Hcl] Shortness Of Breath    Dyspnea     Penicillins     Facial angioedema Did it involve swelling of the face/tongue/throat, SOB, or low BP? Yes Did it involve sudden or severe rash/hives, skin peeling, or any reaction on the inside of your mouth or nose? No Did you need to seek medical attention at a hospital or doctor's office? No When did  it last happen?      30+ years If all above answers are "NO", may proceed with cephalosporin use.    Sulfamethoxazole-Trimethoprim Hives   Nickel Itching   Oxycodone Itching   Zanaflex [Tizanidine Hydrochloride] Other (See Comments)    Causes more pain     Family History:  Family History  Problem Relation Age of Onset   Heart attack Mother 58   COPD Mother    Breast cancer Mother 54   Hypertension Father    Diabetes Father    Heart failure Father 81   Breast cancer Paternal Aunt        cns mets   COPD Maternal Grandmother    Heart disease Paternal Grandmother        died 41   Diabetes Paternal Grandmother    Stroke Paternal Grandfather        in late 44s   Heart attack Brother 64   Colon cancer Neg Hx      Current Outpatient Medications:    ALPRAZolam (XANAX) 1 MG tablet, TAKE 1 TABLET BY MOUTH 3 TIMES DAILY AS NEEDED FOR ANXIETY., Disp: 90 tablet, Rfl: 0   amiodarone (PACERONE) 200 MG tablet, Take 1 tablet (200 mg total) by mouth daily., Disp: 90 tablet, Rfl: 3   atorvastatin (LIPITOR) 40 MG tablet, TAKE 1 TABLET BY MOUTH EVERY DAY, Disp: 90 tablet, Rfl: 3   Calcium Carb-Cholecalciferol (CALCIUM 600 + D PO), Take 600 mg by mouth daily., Disp: , Rfl:    clindamycin (CLEOCIN T) 1 % lotion, Apply 1 application  topically 2 (two) times daily as needed (itching/rash)., Disp: , Rfl:    clobetasol (TEMOVATE) 0.05 % external solution, Apply 1 application  topically daily as needed (Rosacea)., Disp: , Rfl:    cyclobenzaprine (FLEXERIL) 5 MG tablet, TAKE 1 TABLET BY MOUTH AT BEDTIME AS NEEDED FOR MUSCLE SPASMS., Disp: 30 tablet, Rfl: 0   fluocinonide (LIDEX) 0.05 % external solution, Apply 1 application topically daily as needed (rosacea)., Disp: , Rfl:    FLUoxetine (PROZAC) 40 MG capsule, TAKE 1 CAPSULE BY MOUTH EVERY DAY, Disp: 90 capsule, Rfl: 1   furosemide (LASIX) 40 MG tablet, Take 1 tablet (40 mg total) by mouth daily., Disp: 90 tablet, Rfl: 1   gabapentin (NEURONTIN) 300 MG  capsule, Take 1 capsule (300 mg total) by mouth at bedtime., Disp: 90 capsule, Rfl: 1   methocarbamol (ROBAXIN) 750 MG tablet, Take 750 mg by mouth 3 (three) times daily as needed for muscle spasms., Disp: , Rfl:    metroNIDAZOLE (METROCREAM) 0.75 % cream, Apply 1 application topically 2 (two) times daily as needed (rosacea). , Disp: , Rfl:    Multiple Vitamin (MULTIVITAMIN WITH MINERALS) TABS tablet, Take 1 tablet by mouth daily. Centrum Silver, Disp: , Rfl:    nitroGLYCERIN (NITROSTAT) 0.4 MG SL  tablet, Place 1 tablet (0.4 mg total) under the tongue every 5 (five) minutes as needed for chest pain., Disp: 90 tablet, Rfl: 3   omeprazole (PRILOSEC) 40 MG capsule, TAKE 1 CAPSULE BY MOUTH EVERY DAY, Disp: 90 capsule, Rfl: 1   potassium chloride (KLOR-CON) 10 MEQ tablet, Take 2 tablets (20 mEq total) by mouth daily., Disp: 180 tablet, Rfl: 3   Propylene Glycol (SYSTANE BALANCE) 0.6 % SOLN, Place 1 drop into both eyes daily as needed (Dry eyes)., Disp: , Rfl:    sacubitril-valsartan (ENTRESTO) 49-51 MG, Take 1 tablet by mouth 2 (two) times daily., Disp: 60 tablet, Rfl: 11   traMADol (ULTRAM) 50 MG tablet, Take 1 tablet (50 mg total) by mouth every 6 (six) hours as needed for moderate pain (pain score 4-6)., Disp: 30 tablet, Rfl: 0  Review of Systems:  Negative unless indicated in HPI.   Physical Exam: Vitals:   08/29/23 1433  BP: 130/80  Pulse: (!) 59  Temp: 97.6 F (36.4 C)  TempSrc: Oral  SpO2: 99%  Weight: 283 lb 9.6 oz (128.6 kg)    Body mass index is 45.77 kg/m.   Physical Exam Musculoskeletal:     Comments: Tender to palpation of the upper right, lateral thoracic chest wall under breast      Impression and Plan:  Muscle spasm  Immunization due   -Suspect this to be a muscle spasm.  She already has Robaxin and anti-inflammatories. -Flu vaccine in office today.  Time spent:23 minutes reviewing chart, interviewing and examining patient and formulating plan of  care.     Chaya Jan, MD [facility]

## 2023-08-29 NOTE — Addendum Note (Signed)
Addended by: Kern Reap B on: 08/29/2023 04:21 PM   Modules accepted: Orders

## 2023-08-30 ENCOUNTER — Other Ambulatory Visit: Payer: Self-pay | Admitting: Cardiovascular Disease

## 2023-08-30 ENCOUNTER — Other Ambulatory Visit: Payer: Self-pay | Admitting: Internal Medicine

## 2023-08-30 DIAGNOSIS — E785 Hyperlipidemia, unspecified: Secondary | ICD-10-CM

## 2023-09-04 ENCOUNTER — Other Ambulatory Visit: Payer: Self-pay | Admitting: Internal Medicine

## 2023-09-04 DIAGNOSIS — F419 Anxiety disorder, unspecified: Secondary | ICD-10-CM

## 2023-09-18 ENCOUNTER — Encounter: Payer: Self-pay | Admitting: Internal Medicine

## 2023-09-18 ENCOUNTER — Encounter: Payer: Self-pay | Admitting: Cardiology

## 2023-09-18 DIAGNOSIS — H9192 Unspecified hearing loss, left ear: Secondary | ICD-10-CM

## 2023-09-26 ENCOUNTER — Encounter (INDEPENDENT_AMBULATORY_CARE_PROVIDER_SITE_OTHER): Payer: Self-pay | Admitting: Otolaryngology

## 2023-10-07 ENCOUNTER — Other Ambulatory Visit: Payer: Self-pay | Admitting: Internal Medicine

## 2023-10-07 DIAGNOSIS — F419 Anxiety disorder, unspecified: Secondary | ICD-10-CM

## 2023-10-08 ENCOUNTER — Ambulatory Visit: Payer: Self-pay | Admitting: Internal Medicine

## 2023-10-08 NOTE — Telephone Encounter (Signed)
Copied from CRM [id number]. Topic: Clinical - Medical Advice >> Oct 08, 2023  2:59 PM Florestine Avers wrote: Reason for CRM: Patient called n stating that she is having cold like symptoms, and she wanted to know if she could be prescribed medication for it. Patient is also requesting a call  back.   Chief Complaint: Cough and chest congestion x 2 wks Symptoms: cough, runny nose, irritated throat Frequency: constant Pertinent Negatives: Patient denies chest pain Disposition: [] ED /[] Urgent Care (no appt availability in office) / [x] Appointment(In office/virtual)/ []  [facility]/ [] Home Care/ [] Refused Recommended Disposition /[] [facility]/ []  Follow-up with PCP Additional Notes: Patient states symptoms have persisted x 2 weeks. States that she has tried several home and OTC measures with little to no relied. States that she as concerned about what else she can take give her hx of A/fib and CHF. Appyt scheduled for 01/21   Reason for Disposition  [1] Continuous (nonstop) coughing interferes with work or school AND [2] no improvement using cough treatment per Care Advice  Answer Assessment - Initial Assessment Questions 1. ONSET: "When did the cough begin?"      Started 2 weeks ago  2. SEVERITY: "How bad is the cough today?"      Getting bad, now it feels  like its in chest  3. SPUTUM: "Describe the color of your sputum" (none, dry cough; clear, white, yellow, green)     Clear phlegm  4. HEMOPTYSIS: "Are you coughing up any blood?" If so ask: "How much?" (flecks, streaks, tablespoons, etc.)     No blood  5. DIFFICULTY BREATHING: "Are you having difficulty breathing?" If Yes, ask: "How bad is it?" (e.g., mild, moderate, severe)    - MILD: No SOB at rest, mild SOB with walking, speaks normally in sentences, can lie down, no retractions, pulse < 100.    - MODERATE: SOB at rest, SOB with minimal exertion and prefers to sit, cannot lie down flat, speaks in phrases, mild  retractions, audible wheezing, pulse 100-120.    - SEVERE: Very SOB at rest, speaks in single words, struggling to breathe, sitting hunched forward, retractions, pulse > 120      Mild- after coughing so much  6. FEVER: "Do you have a fever?" If Yes, ask: "What is your temperature, how was it measured, and when did it start?"     No fever  7. CARDIAC HISTORY: "Do you have any history of heart disease?" (e.g., heart attack, congestive heart failure)      Has A.Fib and CHF  8. LUNG HISTORY: "Do you have any history of lung disease?"  (e.g., pulmonary embolus, asthma, emphysema)     No  9. PE RISK FACTORS: "Do you have a history of blood clots?" (or: recent major surgery, recent prolonged travel, bedridden)     DVT several years ago after a surgery  10. OTHER SYMPTOMS: "Do you have any other symptoms?" (e.g., runny nose, wheezing, chest pain)       Sinus pressure, throat raw, and chest congestion  11. PREGNANCY: "Is there any chance you are pregnant?" "When was your last menstrual period?"       No  12. TRAVEL: "Have you traveled out of the country in the last month?" (e.g., travel history, exposures)       No  Protocols used: Cough - Acute Productive-A-AH

## 2023-10-09 ENCOUNTER — Ambulatory Visit: Payer: Medicare Other | Admitting: Family Medicine

## 2023-10-09 ENCOUNTER — Encounter: Payer: Self-pay | Admitting: Family Medicine

## 2023-10-09 VITALS — BP 168/82 | HR 62 | Temp 98.4°F | Ht 66.0 in | Wt 286.7 lb

## 2023-10-09 DIAGNOSIS — R051 Acute cough: Secondary | ICD-10-CM

## 2023-10-09 DIAGNOSIS — R0982 Postnasal drip: Secondary | ICD-10-CM

## 2023-10-09 MED ORDER — BENZONATATE 100 MG PO CAPS: 100.0000 mg | ORAL_CAPSULE | Freq: Three times a day (TID) | ORAL | 0 refills | Status: DC | PRN

## 2023-10-09 NOTE — Progress Notes (Signed)
Established Patient Office Visit  Subjective   Patient ID: Kathy [name], female    DOB: 24-Jan-1951  Age: 73 y.o. MRN: [id number]  Chief Complaint  Patient presents with   Cough    Cough and congestion     HPI   Kathy [name] seen with 2-week history of cough and congestion.  She feels like her cough is predominantly triggered by postnasal drip.  Cough mostly clear.  She has been using some Halls cough drops with temporary relief.  Cough interfering with sleep.  No known sick contacts.  Denies any recent fever or chills.  Denies any history of asthma.  She does have history of GERD treated with omeprazole.  Longstanding history of postnasal drip.  She uses Claritin fairly regularly.  No ACE inhibitor use.  Non-smoker  Past Medical History:  Diagnosis Date   Adenomatous colon polyp    2012   Anxiety and depression    Arthritis    Osteoarthritis, Dr Despina Hick   Cancer [facility])    skin - left foot excised   Chronic diastolic CHF (congestive heart failure) (HCC)    DDD (degenerative disc disease), cervical    Deaf, right    DVT (deep venous thrombosis) (HCC)    RLE DVT 08/2004, post right knee arthroscopy   Dysrhythmia    A.Fib   Fibromyalgia    Dr Azzie Roup, Rheu   GERD (gastroesophageal reflux disease)    H/O hiatal hernia    Headache(784.0)    PAST HX MILD MIGRAINES - resolved per patient 08/18/20   History of kidney stones    passed stones   HTN (hypertension)    IBS (irritable bowel syndrome)    Internal hemorrhoid    Lumbar radiculopathy    Obesity    OSA on CPAP    Moderate with AHI 20/hr with nocturnal hypoxemia on CPAP   PAF (paroxysmal atrial fibrillation) (HCC) 06/2018   Pulmonary hypertension (HCC)    Past Surgical History:  Procedure Laterality Date   ABDOMINAL HYSTERECTOMY     dysfunctional menses   APPENDECTOMY     BREAST EXCISIONAL BIOPSY Right    CARDIAC CATHETERIZATION  06/26/2018   CARDIOVERSION N/A 10/10/2022   Procedure: CARDIOVERSION;   Surgeon: Dorthula Nettles, DO;  [facility];  Service: Cardiovascular;  Laterality: N/A;   CHOLECYSTECTOMY     COLONOSCOPY  05/27/2002, 08/08/11   2003 diverticulosis, hemorrhoids 2012 same + cecal polyp   EYE SURGERY     KNEE ARTHROSCOPY  2008   right   KYPHOPLASTY Bilateral 08/19/2020   Procedure: KYPHOPLASTY LUMBAR ONE;  Surgeon: Bedelia Person, MD;  Location: [facility] OR;  Service: Neurosurgery;  Laterality: Bilateral;  posterior   KYPHOPLASTY N/A 07/31/2023   Procedure: KYPHOPLASTY LUMBAR FOUR;  Surgeon: Bedelia Person, MD;  Location: [facility] OR;  Service: Neurosurgery;  Laterality: N/A;   LEFT HEART CATH AND CORONARY ANGIOGRAPHY N/A 06/26/2018   Procedure: LEFT HEART CATH AND CORONARY ANGIOGRAPHY;  Surgeon: Iran Ouch, MD;  [facility];  Service: Cardiovascular;  Laterality: N/A;   MELANOMA EXCISION Left 07/2013   foot   RIGHT HEART CATH N/A 10/10/2022   Procedure: RIGHT HEART CATH;  Surgeon: Dorthula Nettles, DO;  [facility];  Service: Cardiovascular;  Laterality: N/A;   TEE WITHOUT CARDIOVERSION N/A 10/10/2022   Procedure: TRANSESOPHAGEAL ECHOCARDIOGRAM (TEE);  Surgeon: Dorthula Nettles, DO;  [facility]: [facility];  Service: Cardiovascular;  Laterality: N/A;   TOTAL HIP ARTHROPLASTY  02/2010  left ; Dr Despina Hick   TOTAL HIP ARTHROPLASTY  03/18/2012   Procedure: TOTAL HIP ARTHROPLASTY;  Surgeon: Loanne Drilling, MD;  Location: [facility];  Service: Orthopedics;  Laterality: Right;   UPPER GASTROINTESTINAL ENDOSCOPY  04/21/2009, 08/08/11   2010 gastritis ;2012 small hiatal hernia. Dr Leone Payor   WRIST FRACTURE SURGERY Left     reports that she has never smoked. She has never used smokeless tobacco. She reports current alcohol use. She reports that she does not use drugs. family history includes Breast cancer in her paternal aunt; Breast cancer (age of onset: 72) in her mother; COPD in her maternal grandmother and mother; Diabetes in her  father and paternal grandmother; Heart attack (age of onset: 40) in her brother; Heart attack (age of onset: 25) in her mother; Heart disease in her paternal grandmother; Heart failure (age of onset: 51) in her father; Hypertension in her father; Stroke in her paternal grandfather. Allergies  Allergen Reactions   Achromycin [Tetracycline Hcl] Shortness Of Breath    Dyspnea     Penicillins     Facial angioedema Did it involve swelling of the face/tongue/throat, SOB, or low BP? Yes Did it involve sudden or severe rash/hives, skin peeling, or any reaction on the inside of your mouth or nose? No Did you need to seek medical attention at a hospital or doctor's office? No When did it last happen?      30+ years If all above answers are "NO", may proceed with cephalosporin use.    Sulfamethoxazole-Trimethoprim Hives   Nickel Itching   Oxycodone Itching   Zanaflex [Tizanidine Hydrochloride] Other (See Comments)    Causes more pain     Review of Systems  Constitutional:  Negative for chills and fever.  HENT:  Positive for congestion. Negative for ear pain and sinus pain.   Respiratory:  Positive for cough.       Objective:     BP (!) 168/82 (BP Location: Left Arm, Patient Position: Sitting, Cuff Size: Large)   Pulse 62   Temp 98.4 F (36.9 C) (Oral)   Ht 5\' 6"  (1.676 m)   Wt 286 lb 11.2 oz (130 kg)   SpO2 94%   BMI 46.27 kg/m  BP Readings from Last 3 Encounters:  10/09/23 (!) 168/82  08/29/23 130/80  07/31/23 (!) 118/54   Wt Readings from Last 3 Encounters:  10/09/23 286 lb 11.2 oz (130 kg)  08/29/23 283 lb 9.6 oz (128.6 kg)  07/31/23 271 lb (122.9 kg)      Physical Exam Vitals reviewed.  Constitutional:      General: She is not in acute distress.    Appearance: She is not ill-appearing.  HENT:     Right Ear: Tympanic membrane normal.     Left Ear: Tympanic membrane normal.     Mouth/Throat:     Mouth: Mucous membranes are moist.     Pharynx: Oropharynx is clear.   Cardiovascular:     Rate and Rhythm: Normal rate and regular rhythm.  Pulmonary:     Effort: Pulmonary effort is normal.     Breath sounds: Normal breath sounds. No wheezing or rales.  Neurological:     Mental Status: She is alert.      No results found for any visits on 10/09/23.    The 10-year ASCVD risk score (Arnett DK, et al., 2019) is: 21.7%    Assessment & Plan:   2-week history of cough.  No evidence for reactive airway disease.  She does not have any red flags such as fever or significant dyspnea. Does have frequent postnasal drip symptoms which may be triggering.  She has history of GERD currently treated with omeprazole.  -Start Tessalon Perles 100 mg every 8 hours as needed for cough -Consider trial of over-the-counter chlorpheniramine 4 mg nightly for postnasal drip symptoms -Caution with mentholated drops such as Halls which could exacerbate GERD -Continue omeprazole -Follow-up immediately for any fever or increased shortness of breath or if cough not improving over the next couple weeks  Her blood pressure was significantly elevated today but she had coughing paroxysm which may have exacerbated  Evelena Peat, MD

## 2023-10-09 NOTE — Patient Instructions (Signed)
Consider trial of chlorpheniramine 4 mg one every 12 hours as needed for postnasal drip.    Follow up for any fever or worsening cough.

## 2023-10-14 ENCOUNTER — Other Ambulatory Visit (HOSPITAL_COMMUNITY): Payer: Self-pay | Admitting: Cardiology

## 2023-10-17 ENCOUNTER — Encounter: Payer: Self-pay | Admitting: Internal Medicine

## 2023-10-17 ENCOUNTER — Ambulatory Visit: Payer: Medicare Other | Admitting: Internal Medicine

## 2023-10-17 VITALS — BP 110/70 | HR 56 | Temp 97.6°F

## 2023-10-17 DIAGNOSIS — J302 Other seasonal allergic rhinitis: Secondary | ICD-10-CM

## 2023-10-17 DIAGNOSIS — J029 Acute pharyngitis, unspecified: Secondary | ICD-10-CM | POA: Diagnosis not present

## 2023-10-17 DIAGNOSIS — J301 Allergic rhinitis due to pollen: Secondary | ICD-10-CM

## 2023-10-17 LAB — POCT RAPID STREP A (OFFICE): Rapid Strep A Screen: NEGATIVE

## 2023-10-17 MED ORDER — FLUTICASONE PROPIONATE 50 MCG/ACT NA SUSP: 2.0000 | Freq: Every day | NASAL | 6 refills | Status: DC

## 2023-10-17 MED ORDER — CETIRIZINE HCL 10 MG PO TABS: 10.0000 mg | ORAL_TABLET | Freq: Every day | ORAL | 1 refills | Status: DC

## 2023-10-17 NOTE — Progress Notes (Signed)
Established Patient Office Visit     CC/Reason for Visit: Left ear pain, congestion  HPI: Kathy [name] is a 73 y.o. female who is coming in today for the above mentioned reasons.  She has been dealing with head cold symptoms now for about 4 weeks.  She saw another provider in the office a week ago and was advised to use OTC management.  Her symptoms initially consisted of postnasal drip and cough.  Now she has left greater than right ear pain with a popping sensation, sometimes it hurts to swallow.  She remains significantly congested.   Past Medical/Surgical History: Past Medical History:  Diagnosis Date   Adenomatous colon polyp    2012   Anxiety and depression    Arthritis    Osteoarthritis, Dr Despina Hick   Cancer [facility])    skin - left foot excised   Chronic diastolic CHF (congestive heart failure) (HCC)    DDD (degenerative disc disease), cervical    Deaf, right    DVT (deep venous thrombosis) (HCC)    RLE DVT 08/2004, post right knee arthroscopy   Dysrhythmia    A.Fib   Fibromyalgia    Dr Azzie Roup, Rheu   GERD (gastroesophageal reflux disease)    H/O hiatal hernia    Headache(784.0)    PAST HX MILD MIGRAINES - resolved per patient 08/18/20   History of kidney stones    passed stones   HTN (hypertension)    IBS (irritable bowel syndrome)    Internal hemorrhoid    Lumbar radiculopathy    Obesity    OSA on CPAP    Moderate with AHI 20/hr with nocturnal hypoxemia on CPAP   PAF (paroxysmal atrial fibrillation) (HCC) 06/2018   Pulmonary hypertension (HCC)     Past Surgical History:  Procedure Laterality Date   ABDOMINAL HYSTERECTOMY     dysfunctional menses   APPENDECTOMY     BREAST EXCISIONAL BIOPSY Right    CARDIAC CATHETERIZATION  06/26/2018   CARDIOVERSION N/A 10/10/2022   Procedure: CARDIOVERSION;  Surgeon: Dorthula Nettles, DO;  [facility];  Service: Cardiovascular;  Laterality: N/A;   CHOLECYSTECTOMY     COLONOSCOPY  05/27/2002,  08/08/11   2003 diverticulosis, hemorrhoids 2012 same + cecal polyp   EYE SURGERY     KNEE ARTHROSCOPY  2008   right   KYPHOPLASTY Bilateral 08/19/2020   Procedure: KYPHOPLASTY LUMBAR ONE;  Surgeon: Bedelia Person, MD;  Location: [facility] OR;  Service: Neurosurgery;  Laterality: Bilateral;  posterior   KYPHOPLASTY N/A 07/31/2023   Procedure: KYPHOPLASTY LUMBAR FOUR;  Surgeon: Bedelia Person, MD;  Location[facility] OR;  Service: Neurosurgery;  Laterality: N/A;   LEFT HEART CATH AND CORONARY ANGIOGRAPHY N/A 06/26/2018   Procedure: LEFT HEART CATH AND CORONARY ANGIOGRAPHY;  Surgeon: Iran Ouch, MD;  [facility];  Service: Cardiovascular;  Laterality: N/A;   MELANOMA EXCISION Left 07/2013   foot   RIGHT HEART CATH N/A 10/10/2022   Procedure: RIGHT HEART CATH;  Surgeon: Dorthula Nettles, DO;  [facility];  Service: Cardiovascular;  Laterality: N/A;   TEE WITHOUT CARDIOVERSION N/A 10/10/2022   Procedure: TRANSESOPHAGEAL ECHOCARDIOGRAM (TEE);  Surgeon: Dorthula Nettles, DO;  [facility]: [facility];  Service: Cardiovascular;  Laterality: N/A;   TOTAL HIP ARTHROPLASTY  02/2010   left ; Dr Despina Hick   TOTAL HIP ARTHROPLASTY  03/18/2012   Procedure: TOTAL HIP ARTHROPLASTY;  Surgeon: Loanne Drilling, MD;  Location: [facility];  Service: Orthopedics;  Laterality: Right;   UPPER GASTROINTESTINAL ENDOSCOPY  04/21/2009, 08/08/11   2010 gastritis ;2012 small hiatal hernia. Dr Leone Payor   WRIST FRACTURE SURGERY Left     Social History:  reports that she has never smoked. She has never used smokeless tobacco. She reports current alcohol use. She reports that she does not use drugs.  Allergies: Allergies  Allergen Reactions   Achromycin [Tetracycline Hcl] Shortness Of Breath    Dyspnea     Penicillins     Facial angioedema Did it involve swelling of the face/tongue/throat, SOB, or low BP? Yes Did it involve sudden or severe rash/hives, skin peeling, or any reaction on  the inside of your mouth or nose? No Did you need to seek medical attention at a hospital or doctor's office? No When did it last happen?      30+ years If all above answers are "NO", may proceed with cephalosporin use.    Sulfamethoxazole-Trimethoprim Hives   Nickel Itching   Oxycodone Itching   Zanaflex [Tizanidine Hydrochloride] Other (See Comments)    Causes more pain     Family History:  Family History  Problem Relation Age of Onset   Heart attack Mother 47   COPD Mother    Breast cancer Mother 66   Hypertension Father    Diabetes Father    Heart failure Father 49   Breast cancer Paternal Aunt        cns mets   COPD Maternal Grandmother    Heart disease Paternal Grandmother        died 22   Diabetes Paternal Grandmother    Stroke Paternal Grandfather        in late 28s   Heart attack Brother 12   Colon cancer Neg Hx      Current Outpatient Medications:    ALPRAZolam (XANAX) 1 MG tablet, TAKE 1 TABLET BY MOUTH THREE TIMES A DAY AS NEEDED FOR ANXIETY, Disp: 90 tablet, Rfl: 0   amiodarone (PACERONE) 200 MG tablet, Take 1 tablet (200 mg total) by mouth daily., Disp: 90 tablet, Rfl: 3   atorvastatin (LIPITOR) 40 MG tablet, TAKE 1 TABLET BY MOUTH EVERY DAY, Disp: 30 tablet, Rfl: 0   benzonatate (TESSALON PERLES) 100 MG capsule, Take 1 capsule (100 mg total) by mouth 3 (three) times daily as needed., Disp: 30 capsule, Rfl: 0   Calcium Carb-Cholecalciferol (CALCIUM 600 + D PO), Take 600 mg by mouth daily., Disp: , Rfl:    cetirizine (ZYRTEC) 10 MG tablet, Take 1 tablet (10 mg total) by mouth daily., Disp: 90 tablet, Rfl: 1   clindamycin (CLEOCIN T) 1 % lotion, Apply 1 application  topically 2 (two) times daily as needed (itching/rash)., Disp: , Rfl:    clobetasol (TEMOVATE) 0.05 % external solution, Apply 1 application  topically daily as needed (Rosacea)., Disp: , Rfl:    cyclobenzaprine (FLEXERIL) 5 MG tablet, TAKE 1 TABLET BY MOUTH AT BEDTIME AS NEEDED FOR MUSCLE SPASMS.,  Disp: 30 tablet, Rfl: 0   fluocinonide (LIDEX) 0.05 % external solution, Apply 1 application topically daily as needed (rosacea)., Disp: , Rfl:    FLUoxetine (PROZAC) 40 MG capsule, TAKE 1 CAPSULE BY MOUTH EVERY DAY, Disp: 90 capsule, Rfl: 1   fluticasone (FLONASE) 50 MCG/ACT nasal spray, Place 2 sprays into both nostrils daily., Disp: 16 g, Rfl: 6   furosemide (LASIX) 40 MG tablet, TAKE 1 TABLET BY MOUTH EVERY DAY, Disp: 90 tablet, Rfl: 1   gabapentin (NEURONTIN) 300 MG  capsule, Take 1 capsule (300 mg total) by mouth at bedtime., Disp: 90 capsule, Rfl: 1   methocarbamol (ROBAXIN) 750 MG tablet, Take 750 mg by mouth 3 (three) times daily as needed for muscle spasms., Disp: , Rfl:    metroNIDAZOLE (METROCREAM) 0.75 % cream, Apply 1 application topically 2 (two) times daily as needed (rosacea). , Disp: , Rfl:    Multiple Vitamin (MULTIVITAMIN WITH MINERALS) TABS tablet, Take 1 tablet by mouth daily. Centrum Silver, Disp: , Rfl:    nitroGLYCERIN (NITROSTAT) 0.4 MG SL tablet, Place 1 tablet (0.4 mg total) under the tongue every 5 (five) minutes as needed for chest pain., Disp: 90 tablet, Rfl: 3   omeprazole (PRILOSEC) 40 MG capsule, TAKE 1 CAPSULE BY MOUTH EVERY DAY, Disp: 90 capsule, Rfl: 1   potassium chloride (KLOR-CON) 10 MEQ tablet, Take 2 tablets (20 mEq total) by mouth daily., Disp: 180 tablet, Rfl: 3   Propylene Glycol (SYSTANE BALANCE) 0.6 % SOLN, Place 1 drop into both eyes daily as needed (Dry eyes)., Disp: , Rfl:    sacubitril-valsartan (ENTRESTO) 49-51 MG, Take 1 tablet by mouth 2 (two) times daily., Disp: 60 tablet, Rfl: 11   traMADol (ULTRAM) 50 MG tablet, Take 1 tablet (50 mg total) by mouth every 6 (six) hours as needed for moderate pain (pain score 4-6)., Disp: 30 tablet, Rfl: 0  Review of Systems:  Negative unless indicated in HPI.   Physical Exam: Vitals:   10/17/23 1430  BP: 110/70  Pulse: (!) 56  Temp: 97.6 F (36.4 C)  TempSrc: Oral  SpO2: 95%    There is no height  or weight on file to calculate BMI.   Physical Exam Vitals reviewed.  Constitutional:      Appearance: Normal appearance.  HENT:     Right Ear: Ear canal and external ear normal. A middle ear effusion is present.     Left Ear: Ear canal and external ear normal. A middle ear effusion is present.     Mouth/Throat:     Mouth: Mucous membranes are moist.     Pharynx: Oropharynx is clear.  Eyes:     Conjunctiva/sclera: Conjunctivae normal.     Pupils: Pupils are equal, round, and reactive to light.  Cardiovascular:     Rate and Rhythm: Normal rate and regular rhythm.  Pulmonary:     Effort: Pulmonary effort is normal.     Breath sounds: Normal breath sounds.  Neurological:     Mental Status: She is alert.      Impression and Plan:  Sore throat -     POCT rapid strep A  Non-seasonal allergic rhinitis due to pollen -     Fluticasone Propionate; Place 2 sprays into both nostrils daily.  Dispense: 16 g; Refill: 6  Seasonal allergies -     Cetirizine HCl; Take 1 tablet (10 mg total) by mouth daily.  Dispense: 90 tablet; Refill: 1   -In office rapid strep test is negative. -Believe she has Eustachian tube dysfunction and rhinitis that was made worse by URI. -Have advised daily use of antihistamine and Flonase.  Time spent:23 minutes reviewing chart, interviewing and examining patient and formulating plan of care.     Chaya Jan, MD [facility]

## 2023-10-24 ENCOUNTER — Telehealth (INDEPENDENT_AMBULATORY_CARE_PROVIDER_SITE_OTHER): Payer: Self-pay | Admitting: Otolaryngology

## 2023-10-24 NOTE — Telephone Encounter (Signed)
Reminder Call: Date: 10/25/2023 Status: Sch  Time: 10:45 AM [address] [place], [place] [address]  Left voicemail w/time and location.

## 2023-10-25 ENCOUNTER — Ambulatory Visit (INDEPENDENT_AMBULATORY_CARE_PROVIDER_SITE_OTHER): Payer: Medicare Other | Admitting: Otolaryngology

## 2023-10-25 ENCOUNTER — Encounter (INDEPENDENT_AMBULATORY_CARE_PROVIDER_SITE_OTHER): Payer: Self-pay

## 2023-10-25 VITALS — BP 144/62 | Ht 66.5 in

## 2023-10-25 DIAGNOSIS — H938X2 Other specified disorders of left ear: Secondary | ICD-10-CM | POA: Diagnosis not present

## 2023-10-25 DIAGNOSIS — H6992 Unspecified Eustachian tube disorder, left ear: Secondary | ICD-10-CM | POA: Diagnosis not present

## 2023-10-25 DIAGNOSIS — H90A21 Sensorineural hearing loss, unilateral, right ear, with restricted hearing on the contralateral side: Secondary | ICD-10-CM

## 2023-10-25 MED ORDER — PREDNISONE 10 MG PO TABS: 10.0000 mg | ORAL_TABLET | Freq: Every day | ORAL | 0 refills | Status: AC

## 2023-10-25 NOTE — Progress Notes (Signed)
 Dear Dr. Theophilus Andrews, Here is my assessment for our mutual patient, Kathy [name]. Thank you for allowing me the opportunity to care for your patient. Please do not hesitate to contact me should you have any other questions. Sincerely, Dr. Eldora Blanch  Otolaryngology Clinic Note Referring provider: Dr. Theophilus Andrews HPI:  Kathy [name] is a 73 y.o. female kindly referred by Dr. Theophilus Andrews for evaluation of hearing loss.  Initial visit (10/2023): Patient reports: sudden hearing loss in right ear in 2016 after URI, saw Dr. Ethyl for it, and has significant hearing loss. No change in that ear. She last saw him in 2022, and noted to have normal hearing. She reports that over the past few months, she reports that she noted herself turning the TV up and her son has noted hearing is worse.  About 4-5 weeks ago, she was also diagnosed with a URI and had PND, cough, congestion; no fevers; given tessalon , flonase , and zyrtec  and she reports that she is doing better. She reports that she had some ear discomfort (intermittent) but has not happened over last few days -- left ear does feel like there is pressure. No discolored drainage, no facial pressure/pain currently. No frequent sinus infections.  Patient denies: vertigo, drainage, tinnitus Patient additionally denies: deep pain in ear canal, eustachian tube symptoms such as popping/crackling (currently, but had some recently), sensitive to pressure changes Patient also denies barotrauma, vestibular suppressant use, ototoxic medication use Prior ear surgery: no Does not get frequent ear infections  H&N Surgery: no  GLP-1: no AP/AC: Eliquis   Tobacco: no.  Lives in [place], [place].  PMHx: A-fib, HTN, CHF, OA, CKD, Depression (grieving recent loss of her son), DVT, prior migraines  Independent Review of Additional Tests or Records:  Dr. Ethyl (11/25/2020): Left ear hearing loss, had sudden right SNHL in 2016; noted normal left  ear hearing; no loss; audio: normal hearing left ear SRT 10dB; type A tymp; right ear severe SNHL with SRT 100dB Dr. Theophilus Andrews, MD [facility]) - 2025: noted left ear hearing loss; need ref to ENT Dr. Micheal (FM) 10/09/2023: noted two week history of cough, no red back sx, GERD on omeprazole ; Dx: Rhinitis and cough; Rx: flonase  and zyrtec  Dr. Theophilus Andrews (FM) 10/17/2023: noted 4 week history of head cold sx, PND, Cough, now left ear discomfort with popping sensation with congestion. Dx: Rhinitis;Rx: Xyrtec, Flonase  CT Head 11/32023 independently interpreted with respect to sinuses and ears: no significant sinonasal disease; cuts thick but mastoids and ME well aerated; no obvious otic capsule or ossicular chain abnormality MRI Brain 12/07/2014 independently interpreted with respect to ears - no mastoid effusion; no retrocochlear lesion noted (done after hearing loss) PMH/Meds/All/SocHx/FamHx/ROS:   Past Medical History:  Diagnosis Date   Adenomatous colon polyp    2012   Anxiety and depression    Arthritis    Osteoarthritis, Dr Hiram   Cancer [facility])    skin - left foot excised   Chronic diastolic CHF (congestive heart failure) (HCC)    DDD (degenerative disc disease), cervical    Deaf, right    DVT (deep venous thrombosis) (HCC)    RLE DVT 08/2004, post right knee arthroscopy   Dysrhythmia    A.Fib   Fibromyalgia    Dr Ludie Piety, Rheu   GERD (gastroesophageal reflux disease)    H/O hiatal hernia    Headache(784.0)    PAST HX MILD MIGRAINES - resolved per patient 08/18/20   History of kidney stones  passed stones   HTN (hypertension)    IBS (irritable bowel syndrome)    Internal hemorrhoid    Lumbar radiculopathy    Obesity    OSA on CPAP    Moderate with AHI 20/hr with nocturnal hypoxemia on CPAP   PAF (paroxysmal atrial fibrillation) (HCC) 06/2018   Pulmonary hypertension (HCC)      Past Surgical History:  Procedure Laterality Date   ABDOMINAL HYSTERECTOMY      dysfunctional menses   APPENDECTOMY     BREAST EXCISIONAL BIOPSY Right    CARDIAC CATHETERIZATION  06/26/2018   CARDIOVERSION N/A 10/10/2022   Procedure: CARDIOVERSION;  Surgeon: Gardenia Led, DO;  Location: [facility] END[facility];  Service: Cardiovascular;  Laterality: N/A;   CHOLECYSTECTOMY     COLONOSCOPY  05/27/2002, 08/08/11   2003 diverticulosis, hemorrhoids 2012 same + cecal polyp   EYE SURGERY     KNEE ARTHROSCOPY  2008   right   KYPHOPLASTY Bilateral 08/19/2020   Procedure: KYPHOPLASTY LUMBAR ONE;  Surgeon: Debby Dorn MATSU, MD;  [facility];  Service: Neurosurgery;  Laterality: Bilateral;  posterior   KYPHOPLASTY N/A 07/31/2023   Procedure: KYPHOPLASTY LUMBAR FOUR;  Surgeon: Debby Dorn MATSU, MD;  Location: [facility] OR;  Service: Neurosurgery;  Laterality: N/A;   LEFT HEART CATH AND CORONARY ANGIOGRAPHY N/A 06/26/2018   Procedure: LEFT HEART CATH AND CORONARY ANGIOGRAPHY;  Surgeon: Darron Deatrice LABOR, MD;  Location: [facility] INVASIVE CV LAB;  Service: Cardiovascular;  Laterality: N/A;   MELANOMA EXCISION Left 07/2013   foot   RIGHT HEART CATH N/A 10/10/2022   Procedure: RIGHT HEART CATH;  Surgeon: Gardenia Led, DO;  Location: [facility] INVASIVE CV LAB;  Service: Cardiovascular;  Laterality: N/A;   TEE WITHOUT CARDIOVERSION N/A 10/10/2022   Procedure: TRANSESOPHAGEAL ECHOCARDIOGRAM (TEE);  Surgeon: Gardenia Led, DO;  Location: [facility] ENDOS[facility];  Service: Cardiovascular;  Laterality: N/A;   TOTAL HIP ARTHROPLASTY  02/2010   left ; Dr Hiram   TOTAL HIP ARTHROPLASTY  03/18/2012   Procedure: TOTAL HIP ARTHROPLASTY;  Surgeon: Dempsey LULLA Moan, MD;  Location: [facility] ORS;  Service: Orthopedics;  Laterality: Right;   UPPER GASTROINTESTINAL ENDOSCOPY  04/21/2009, 08/08/11   2010 gastritis ;2012 small hiatal hernia. Dr Avram   WRIST FRACTURE SURGERY Left     Family History  Problem Relation Age of Onset   Heart attack Mother 26   COPD Mother    Breast cancer Mother 73   Hypertension Father     Diabetes Father    Heart failure Father 27   Breast cancer Paternal Aunt        cns mets   COPD Maternal Grandmother    Heart disease Paternal Grandmother        died 94   Diabetes Paternal Grandmother    Stroke Paternal Grandfather        in late 61s   Heart attack Brother 23   Colon cancer Neg Hx      Social Connections: Moderately Integrated (12/27/2022)   Social Connection and Isolation Panel [NHANES]    Frequency of Communication with Friends and Family: More than three times a week    Frequency of Social Gatherings with Friends and Family: More than three times a week    Attends Religious Services: 1 to 4 times per year    Active Member of [employer] or Organizations: Yes    Attends Banker Meetings: 1 to 4 times per year    Marital Status: Widowed  Recent Concern: Social Connections - Moderately  Isolated (11/15/2022)   Social Connection and Isolation Panel [NHANES]    Frequency of Communication with Friends and Family: More than three times a week    Frequency of Social Gatherings with Friends and Family: Three times a week    Attends Religious Services: More than 4 times per year    Active Member of Clubs or Organizations: No    Attends Banker Meetings: Never    Marital Status: Widowed      Current Outpatient Medications:    ALPRAZolam  (XANAX ) 1 MG tablet, TAKE 1 TABLET BY MOUTH THREE TIMES A DAY AS NEEDED FOR ANXIETY, Disp: 90 tablet, Rfl: 0   amiodarone  (PACERONE ) 200 MG tablet, Take 1 tablet (200 mg total) by mouth daily., Disp: 90 tablet, Rfl: 3   atorvastatin  (LIPITOR) 40 MG tablet, TAKE 1 TABLET BY MOUTH EVERY DAY, Disp: 30 tablet, Rfl: 0   benzonatate  (TESSALON  PERLES) 100 MG capsule, Take 1 capsule (100 mg total) by mouth 3 (three) times daily as needed., Disp: 30 capsule, Rfl: 0   Calcium  Carb-Cholecalciferol (CALCIUM  600 + D PO), Take 600 mg by mouth daily., Disp: , Rfl:    cetirizine  (ZYRTEC ) 10 MG tablet, Take 1 tablet (10 mg total) by  mouth daily., Disp: 90 tablet, Rfl: 1   clindamycin (CLEOCIN T) 1 % lotion, Apply 1 application  topically 2 (two) times daily as needed (itching/rash)., Disp: , Rfl:    clobetasol  (TEMOVATE ) 0.05 % external solution, Apply 1 application  topically daily as needed (Rosacea)., Disp: , Rfl:    cyclobenzaprine  (FLEXERIL ) 5 MG tablet, TAKE 1 TABLET BY MOUTH AT BEDTIME AS NEEDED FOR MUSCLE SPASMS., Disp: 30 tablet, Rfl: 0   fluocinonide (LIDEX) 0.05 % external solution, Apply 1 application topically daily as needed (rosacea)., Disp: , Rfl:    FLUoxetine  (PROZAC ) 40 MG capsule, TAKE 1 CAPSULE BY MOUTH EVERY DAY, Disp: 90 capsule, Rfl: 1   fluticasone  (FLONASE ) 50 MCG/ACT nasal spray, Place 2 sprays into both nostrils daily., Disp: 16 g, Rfl: 6   furosemide  (LASIX ) 40 MG tablet, TAKE 1 TABLET BY MOUTH EVERY DAY, Disp: 90 tablet, Rfl: 1   gabapentin  (NEURONTIN ) 300 MG capsule, Take 1 capsule (300 mg total) by mouth at bedtime., Disp: 90 capsule, Rfl: 1   methocarbamol  (ROBAXIN ) 750 MG tablet, Take 750 mg by mouth 3 (three) times daily as needed for muscle spasms., Disp: , Rfl:    metroNIDAZOLE  (METROCREAM ) 0.75 % cream, Apply 1 application topically 2 (two) times daily as needed (rosacea). , Disp: , Rfl:    Multiple Vitamin (MULTIVITAMIN WITH MINERALS) TABS tablet, Take 1 tablet by mouth daily. Centrum Silver, Disp: , Rfl:    nitroGLYCERIN  (NITROSTAT ) 0.4 MG SL tablet, Place 1 tablet (0.4 mg total) under the tongue every 5 (five) minutes as needed for chest pain., Disp: 90 tablet, Rfl: 3   omeprazole  (PRILOSEC) 40 MG capsule, TAKE 1 CAPSULE BY MOUTH EVERY DAY, Disp: 90 capsule, Rfl: 1   potassium chloride  (KLOR-CON ) 10 MEQ tablet, Take 2 tablets (20 mEq total) by mouth daily., Disp: 180 tablet, Rfl: 3   predniSONE  (DELTASONE ) 10 MG tablet, Take 1 tablet (10 mg total) by mouth daily with breakfast for 5 days., Disp: 5 tablet, Rfl: 0   Propylene Glycol (SYSTANE BALANCE) 0.6 % SOLN, Place 1 drop into both eyes  daily as needed (Dry eyes)., Disp: , Rfl:    sacubitril -valsartan  (ENTRESTO ) 49-51 MG, Take 1 tablet by mouth 2 (two) times daily., Disp: 60 tablet, Rfl:  11   traMADol  (ULTRAM ) 50 MG tablet, Take 1 tablet (50 mg total) by mouth every 6 (six) hours as needed for moderate pain (pain score 4-6)., Disp: 30 tablet, Rfl: 0   Physical Exam:   BP (!) 144/62 (BP Location: Left Arm, Patient Position: Sitting, Cuff Size: Large)   Ht 5' 6.5 (1.689 m)   BMI 45.58 kg/m   Salient findings:  CN II-XII intact Given history and complaints, ear microscopy was indicated and performed for evaluation with findings as below in physical exam section and in procedures. Noted Bilateral EAC clear and TM intact with well pneumatized middle ear spaces, mild global retraction b/l Weber 512: left Rinne 512: AC > BC b/l Anterior rhinoscopy: Septum relatively midline; bilateral inferior turbinates without significant hypertrophy; no purulence No lesions of oral cavity/oropharynx No obviously palpable neck masses/lymphadenopathy/thyromegaly No respiratory distress or stridor  Seprately Identifiable Procedures:  Procedure: Bilateral ear microscopy using microscope (CPT 92504) Pre-procedure diagnosis: left ear hearing loss, concern for otitis media Post-procedure diagnosis: same Indication: see above; given patient's otologic complaints and history, for improved and comprehensive examination of external ear and tympanic membrane, bilateral otologic examination using microscope was performed  Procedure: Patient was placed semi-recumbent. Both ear canals were examined using the microscope with findings above Patient tolerated the procedure well.   Impression & Plans:  Alyric Fielden is a 73 y.o. female with:  1. Sensorineural hearing loss (SNHL) of right ear with restricted hearing of left ear   2. ETD (Eustachian tube dysfunction), left   3. Ear fullness, left    Noted right sudden loss since 2016 (she reports  after URI) with no lesion noted on MRI at the time. She reports some gradual subjective HL on left over past few months, worse after URI with some ETD symptoms. No effusion noted today, but given recent URI and sx and only hearing ear with some pressure, we will do a short pred course and continue flonase  and zyrtec . Will f/u in 4-6 weeks with audio same day to assess hearing status Autoinsufflate ears  See below regarding exact medications prescribed this encounter including dosages and route: Meds ordered this encounter  Medications   predniSONE  (DELTASONE ) 10 MG tablet    Sig: Take 1 tablet (10 mg total) by mouth daily with breakfast for 5 days.    Dispense:  5 tablet    Refill:  0      Thank you for allowing me the opportunity to care for your patient. Please do not hesitate to contact me should you have any other questions.  Sincerely, Eldora Blanch, MD Otolaryngologist (ENT), [facility] Phone: [phone] Fax: [phone]  10/25/2023, 11:24 AM   MDM:  Level 4 - [id number] Complexity/Problems addressed: mod - one chronic problem, one acute Data complexity: mod - independent review of notes; independent interpretation of CT and MRI imaging - Morbidity: mod  - Prescription Drug prescribed or managed: yes

## 2023-10-29 ENCOUNTER — Telehealth: Payer: Self-pay | Admitting: Cardiology

## 2023-10-29 DIAGNOSIS — I4819 Other persistent atrial fibrillation: Secondary | ICD-10-CM

## 2023-10-29 DIAGNOSIS — I272 Pulmonary hypertension, unspecified: Secondary | ICD-10-CM

## 2023-10-29 DIAGNOSIS — I10 Essential (primary) hypertension: Secondary | ICD-10-CM

## 2023-10-29 DIAGNOSIS — I5032 Chronic diastolic (congestive) heart failure: Secondary | ICD-10-CM

## 2023-10-29 DIAGNOSIS — G4733 Obstructive sleep apnea (adult) (pediatric): Secondary | ICD-10-CM

## 2023-10-29 DIAGNOSIS — I480 Paroxysmal atrial fibrillation: Secondary | ICD-10-CM

## 2023-10-29 NOTE — Telephone Encounter (Signed)
 Patient states during last appointment she discussed getting a new CPAP soon, but she wasn't having any major issues. She states she barely gets air now and assumes it needs to be adjusted or replaced soon. She states her breathing will start affecting her heart soon.

## 2023-10-31 NOTE — Telephone Encounter (Signed)
NEW DME: Upon patient request DME selection is [facility]. Patient understands he will be contacted by [facility] to set up his cpap. Patient understands to call if [facility] does not contact him with new setup in a timely manner. Patient understands they will be called once confirmation has been received from [facility]  that they have received their new machine to schedule 10 week follow up appointment.   [facility] notified of new cpap order  Please add to airview Patient was grateful for the call and thanked me.

## 2023-11-01 NOTE — Telephone Encounter (Signed)
DME CHANGED: Upon patient request DME selection is [facility] Patient understands he will be contacted by [facility] to set up his cpap. Patient understands to call if [facility] does not contact him with new setup in a timely manner. Patient understands they will be called once confirmation has been received from [place] that they have received their new machine to schedule 10 week follow up appointment.   [facility] notified of new cpap order  Please add to airview Patient was grateful for the call and thanked me

## 2023-11-04 ENCOUNTER — Other Ambulatory Visit: Payer: Self-pay | Admitting: Cardiovascular Disease

## 2023-11-05 NOTE — Telephone Encounter (Signed)
Prescription refill request for Eliquis received. Indication:afib Last office visit:8/24 Scr:1.35  11/24 Age: 73 Weight:130  kg  Prescription refilled

## 2023-11-06 NOTE — Telephone Encounter (Signed)
states she barely gets air now and assumes it needs to be adjusted or replaced soon. She is not eligible for a new machine. Rx sent to trouble shoot patients machine.

## 2023-11-06 NOTE — Addendum Note (Signed)
Addended by: Reesa Chew on: 11/06/2023 06:35 PM   Modules accepted: Orders

## 2023-11-08 ENCOUNTER — Other Ambulatory Visit (HOSPITAL_COMMUNITY): Payer: Self-pay

## 2023-11-08 ENCOUNTER — Other Ambulatory Visit: Payer: Self-pay | Admitting: Internal Medicine

## 2023-11-08 DIAGNOSIS — F419 Anxiety disorder, unspecified: Secondary | ICD-10-CM

## 2023-11-08 DIAGNOSIS — I5032 Chronic diastolic (congestive) heart failure: Secondary | ICD-10-CM

## 2023-11-08 MED ORDER — SACUBITRIL-VALSARTAN 49-51 MG PO TABS: 1.0000 | ORAL_TABLET | Freq: Two times a day (BID) | ORAL | 5 refills | Status: DC

## 2023-11-19 ENCOUNTER — Telehealth: Payer: Self-pay | Admitting: Cardiology

## 2023-11-19 DIAGNOSIS — I10 Essential (primary) hypertension: Secondary | ICD-10-CM

## 2023-11-19 DIAGNOSIS — G4733 Obstructive sleep apnea (adult) (pediatric): Secondary | ICD-10-CM

## 2023-11-19 DIAGNOSIS — I272 Pulmonary hypertension, unspecified: Secondary | ICD-10-CM

## 2023-11-19 DIAGNOSIS — I5032 Chronic diastolic (congestive) heart failure: Secondary | ICD-10-CM

## 2023-11-19 NOTE — Telephone Encounter (Signed)
 Pt called in stating [facility] received order for CPAP however they no longer accept order with stamp on it, it has to have doctor's signature. Please advise if this can be signed and re faxed.

## 2023-11-20 ENCOUNTER — Encounter: Payer: Self-pay | Admitting: Family Medicine

## 2023-11-20 ENCOUNTER — Ambulatory Visit (INDEPENDENT_AMBULATORY_CARE_PROVIDER_SITE_OTHER): Payer: Medicare Other | Admitting: Family Medicine

## 2023-11-20 DIAGNOSIS — Z0000 Encounter for general adult medical examination without abnormal findings: Secondary | ICD-10-CM

## 2023-11-20 NOTE — Telephone Encounter (Signed)
 Patient Not eligible for a new cpap until August 2025.  Supplies order sent to [facility].

## 2023-11-20 NOTE — Progress Notes (Signed)
 Patient unable to obtain vital signs due to telehealth visit

## 2023-11-20 NOTE — Patient Instructions (Signed)
 I really enjoyed getting to talk with you today! I am available on Tuesdays and Thursdays for virtual visits if you have any questions or concerns, or if I can be of any further assistance.   CHECKLIST FROM ANNUAL WELLNESS VISIT:  -Follow up (please call to schedule if not scheduled after visit):   -yearly for annual wellness visit with primary care office  Here is a list of your preventive care/health maintenance measures and the plan for each if any are due:  PLAN For any measures below that may be due:   Health Maintenance  Topic Date Due   Pneumonia Vaccine 77+ Years old (1 of 2 - PCV) Never done   DTaP/Tdap/Td (1 - Tdap) Never done   Zoster Vaccines- Shingrix (1 of 2) Never done   COVID-19 Vaccine (4 - 2024-25 season) 12/06/2023 (Originally 05/20/2023)   Colonoscopy  10/20/2024   Medicare Annual Wellness (AWV)  11/19/2024   MAMMOGRAM  05/17/2025   INFLUENZA VACCINE  Completed   DEXA SCAN  Completed   Hepatitis C Screening  Completed   HPV VACCINES  Aged Out    -See a dentist at least yearly  -Get your eyes checked and then per your eye specialist's recommendations  -Other issues addressed today:   -I have included below further information regarding a healthy whole foods based diet, physical activity guidelines for adults, stress management and opportunities for social connections. I hope you find this information useful.   -----------------------------------------------------------------------------------------------------------------------------------------------------------------------------------------------------------------------------------------------------------    NUTRITION: -eat real food: lots of colorful vegetables (half the plate) and fruits -5-7 servings of vegetables and fruits per day (fresh or steamed is best), exp. 2 servings of vegetables with lunch and dinner and 2 servings of fruit per day. Berries and greens such as kale and collards are great  choices.  -consume on a regular basis:  fresh fruits, fresh veggies, fish, nuts, seeds, healthy oils (such as olive oil, avocado oil), whole grains (make sure for bread/pasta/crackers/etc., that the first ingredient on label contains the word "whole"), legumes. -can eat small amounts of dairy and lean meat (no larger than the palm of your hand), but avoid processed meats such as ham, bacon, lunch meat, etc. -drink water -try to avoid fast food and pre-packaged foods, processed meat, ultra processed foods/beverages (donuts, candy, etc.) -most experts advise limiting sodium to < 2300mg  per day, should limit further is any chronic conditions such as high blood pressure, heart disease, diabetes, etc. The American Heart Association advised that < 1500mg  is is ideal -try to avoid foods/beverages that contain any ingredients with names you do not recognize  -try to avoid foods/beverages  with added sugar or sweeteners/sweets  -try to avoid sweet drinks (including diet drinks): soda, juice, Gatorade, sweet tea, power drinks, diet drinks -try to avoid white rice, white bread, pasta (unless whole grain)  EXERCISE GUIDELINES FOR ADULTS: -if you wish to increase your physical activity, do so gradually and with the approval of your doctor -STOP and seek medical care immediately if you have any chest pain, chest discomfort or trouble breathing when starting or increasing exercise  -move and stretch your body, legs, feet and arms when sitting for long periods -Physical activity guidelines for optimal health in adults: -get at least 150 minutes per week of moderate exercise (can talk, but not sing); this is about 20-30 minutes of sustained activity 5-7 days per week or two 10-15 minute episodes of sustained activity 5-7 days per week -do some muscle building/resistance training/strength training at  least 2 days per week  -balance exercises 3+ days per week:   Stand somewhere where you have something sturdy to  hold onto if you lose balance    1) lift up on toes, then back down, start with 5x per day and work up to 20x   2) stand and lift one leg straight out to the side so that foot is a few inches of the floor, start with 5x each side and work up to 20x each side   3) stand on one foot, start with 5 seconds each side and work up to 20 seconds on each side  If you need ideas or help with getting more active:  -Silver sneakers https://tools.silversneakers.com  -Walk with a Doc: http://www.duncan-williams.com/  -try to include resistance (weight lifting/strength building) and balance exercises twice per week: or the following link for ideas: http://castillo-powell.com/  BuyDucts.dk  STRESS MANAGEMENT: -can try meditating, or just sitting quietly with deep breathing while intentionally relaxing all parts of your body for 5 minutes daily -if you need further help with stress, anxiety or depression please follow up with your primary doctor or contact the wonderful folks at [facility]: [phone]  SOCIAL CONNECTIONS: -options in [place] if you wish to engage in more social and exercise related activities:  -Silver sneakers https://tools.silversneakers.com  -Walk with a Doc: http://www.duncan-williams.com/  -Check out the [facility] Active Adults 50+ section on the [place] [employer] (hiking clubs, book clubs, cards and games, chess, exercise classes, aquatic classes and much more) - see the website for details: https://www.[place].gov/departments/parks-recreation/active-adults50  -YouTube has lots of exercise videos for different ages and abilities as well  -[facility] (a variety of indoor and outdoor inperson activities for adults). 4424120034. [address]  -Virtual Online Classes (a variety of topics): see seniorplanet.org or call  5162082016  -consider volunteering at a school, hospice center, church, senior center or elsewhere

## 2023-11-20 NOTE — Progress Notes (Signed)
 PATIENT CHECK-IN and HEALTH RISK ASSESSMENT QUESTIONNAIRE:  -completed by phone/video for upcoming Medicare Preventive Visit  -PLEASE SELECT "NOT IN PERSON" for the method of visit.   Pre-Visit Check-in: 1)Vitals (height, wt, BP, etc) - record in vitals section for visit on day of visit Request home vitals (wt, BP, etc.) and enter into vitals, THEN update Vital Signs SmartPhrase below at the top of the HPI. See below.  2)Review and Update Medications, Allergies PMH, Surgeries, Social history in Epic 3)Hospitalizations in the last year with date/reason? Yes back surgery  4)Review and Update Care Team (patient's specialists) in Epic 5) Complete PHQ9 in Epic  6) Complete Fall Screening in Epic 7)Review all Health Maintenance Due and order under PCP if not done.  Medicare Wellness Patient Questionnaire:  Answer theses question about your habits: How often do you have a drink containing alcohol?rare, has a drink Have you ever smoked?no  How many packs a day do/did you smoke? N/A Do you use smokeless tobacco?No Do you use an illicit drugs?no On average, how many days per week do you engage in moderate to strenuous exercise (like a brisk walk)?n/a On average, how many minutes do you engage in exercise at this level?n/a Typical breakfast: Varies  Typical lunch: Varies  Typical dinner: Varies  Typical snacks:  Varies  Beverages: Soda and water  Answer theses question about your everyday activities: Can you perform most household chores? yes Are you deaf or have significant trouble hearing? Yes - Saw ENT recently and will follow up in a week or two to do audiology exam Do you feel that you have a problem with memory? no Do you feel safe at home? yes Last dentist visit?  8. Do you have any difficulty performing your everyday activities?no Are you having any difficulty walking, taking medications on your own, and or difficulty managing daily home needs?no Do you have difficulty walking or  climbing stairs?no Do you have difficulty dressing or bathing?no Do you have difficulty doing errands alone such as visiting a doctor's office or shopping?no Do you currently have any difficulty preparing food and eating?no Do you currently have any difficulty using the toilet?no Do you have any difficulty managing your finances?no Do you have any difficulties with housekeeping of managing your housekeeping?no   Do you have Advanced Directives in place (Living Will, Healthcare Power or Attorney)?  yes   Last eye Exam and location? 3-4 weeks ago Dr. Louanna [facility]   Do you currently use prescribed or non-prescribed narcotic or opioid pain medications?no  Do you have a history or close family history of breast, ovarian, tubal or peritoneal cancer or a family member with BRCA (breast cancer susceptibility 1 and 2) gene mutations? Yes mother breast cancer  Nurse/Assistant Credentials/time stamp: Mg 4:34     ----------------------------------------------------------------------------------------------------------------------------------------------------------------------------------------------------------------------  Because this visit was a virtual/telehealth visit, some criteria may be missing or patient reported. Any vitals not documented were not able to be obtained and vitals that have been documented are patient reported.    MEDICARE ANNUAL PREVENTIVE VISIT WITH PROVIDER: (Welcome to Medicare, initial annual wellness or annual wellness exam)  Virtual Visit via Phone Note  I connected with Kathy [name] on 11/20/23 by phone and verified that I am speaking with the correct person using two identifiers. She declines video and prefers phone visit as does not have good service for video visit.   Location patient: home Location provider:work or home office Persons participating in the virtual visit: patient, provider  Concerns and/or follow up today:  reports doing well.     See HM section in Epic for other details of completed HM.    ROS: negative for report of fevers, unintentional weight loss, vision changes, vision loss, hearing loss or change, chest pain, sob, hemoptysis, melena, hematochezia, hematuria, falls, bleeding or bruising, thoughts of suicide or self harm, memory loss  Patient-completed extensive health risk assessment - reviewed and discussed with the patient: See Health Risk Assessment completed with patient prior to the visit either above or in recent phone note. This was reviewed in detailed with the patient today and appropriate recommendations, orders and referrals were placed as needed per Summary below and patient instructions.   Review of Medical History: -PMH, PSH, Family History and current specialty and care providers reviewed and updated and listed below   Patient Care Team: Philip Aspen, Limmie Patricia, MD as PCP - General (Internal Medicine) Nahser, Deloris Ping, MD as PCP - Cardiology (Cardiology) Philip Aspen, Limmie Patricia, MD (Internal Medicine)   Past Medical History:  Diagnosis Date   Adenomatous colon polyp    2012   Anxiety and depression    Arthritis    Osteoarthritis, Dr Despina Hick   Cancer [facility])    skin - left foot excised   Chronic diastolic CHF (congestive heart failure) (HCC)    DDD (degenerative disc disease), cervical    Deaf, right    DVT (deep venous thrombosis) (HCC)    RLE DVT 08/2004, post right knee arthroscopy   Dysrhythmia    A.Fib   Fibromyalgia    Dr Azzie Roup, Rheu   GERD (gastroesophageal reflux disease)    H/O hiatal hernia    Headache(784.0)    PAST HX MILD MIGRAINES - resolved per patient 08/18/20   History of kidney stones    passed stones   HTN (hypertension)    IBS (irritable bowel syndrome)    Internal hemorrhoid    Lumbar radiculopathy    Obesity    OSA on CPAP    Moderate with AHI 20/hr with nocturnal hypoxemia on CPAP   PAF (paroxysmal atrial fibrillation) (HCC) 06/2018    Pulmonary hypertension (HCC)     Past Surgical History:  Procedure Laterality Date   ABDOMINAL HYSTERECTOMY     dysfunctional menses   APPENDECTOMY     BREAST EXCISIONAL BIOPSY Right    CARDIAC CATHETERIZATION  06/26/2018   CARDIOVERSION N/A 10/10/2022   Procedure: CARDIOVERSION;  Surgeon: Dorthula Nettles, DO;  Location: [facility];  Service: Cardiovascular;  Laterality: N/A;   CHOLECYSTECTOMY     COLONOSCOPY  05/27/2002, 08/08/11   2003 diverticulosis, hemorrhoids 2012 same + cecal polyp   EYE SURGERY     KNEE ARTHROSCOPY  2008   right   KYPHOPLASTY Bilateral 08/19/2020   Procedure: KYPHOPLASTY LUMBAR ONE;  Surgeon: Bedelia Person, MD;  Location: [facility] OR;  Service: Neurosurgery;  Laterality: Bilateral;  posterior   KYPHOPLASTY N/A 07/31/2023   Procedure: KYPHOPLASTY LUMBAR FOUR;  Surgeon: Bedelia Person, MD;  Location[facility];  Service: Neurosurgery;  Laterality: N/A;   LEFT HEART CATH AND CORONARY ANGIOGRAPHY N/A 06/26/2018   Procedure: LEFT HEART CATH AND CORONARY ANGIOGRAPHY;  Surgeon: Iran Ouch, MD;  Location: [facility] INVASIVE CV LAB;  Service: Cardiovascular;  Laterality: N/A;   MELANOMA EXCISION Left 07/2013   foot   RIGHT HEART CATH N/A 10/10/2022   Procedure: RIGHT HEART CATH;  Surgeon: Dorthula Nettles, DO;  Location: [facility] INV[facility] LAB;  Service: Cardiovascular;  Laterality:  N/A;   TEE WITHOUT CARDIOVERSION N/A 10/10/2022   Procedure: TRANSESOPHAGEAL ECHOCARDIOGRAM (TEE);  Surgeon: Dorthula Nettles, DO;  Location: [facility] END[facility];  Service: Cardiovascular;  Laterality: N/A;   TOTAL HIP ARTHROPLASTY  02/2010   left ; Dr Despina Hick   TOTAL HIP ARTHROPLASTY  03/18/2012   Procedure: TOTAL HIP ARTHROPLASTY;  Surgeon: Loanne Drilling, MD;  Location: [facility] ORS;  Service: Orthopedics;  Laterality: Right;   UPPER GASTROINTESTINAL ENDOSCOPY  04/21/2009, 08/08/11   2010 gastritis ;2012 small hiatal hernia. Dr Leone Payor   WRIST FRACTURE SURGERY Left     Social History    Socioeconomic History   Marital status: Widowed    Spouse name: Not on file   Number of children: 2   Years of education: Not on file   Highest education level: 12th grade  Occupational History   Occupation: retired  Tobacco Use   Smoking status: Never   Smokeless tobacco: Never  Vaping Use   Vaping status: Never Used  Substance and Sexual Activity   Alcohol use: Yes    Comment: occasional   Drug use: No   Sexual activity: Not Currently    Birth control/protection: Surgical    Comment: Hysterectomy  Other Topics Concern   Not on file  Social History Narrative   0 caffeine drinks, likes to read, crotchet, spend time with family & friends, very social   Social Drivers of Corporate investment banker Strain: Low Risk  (11/20/2023)   Overall Financial Resource Strain (CARDIA)    Difficulty of Paying Living Expenses: Not hard at all  Food Insecurity: No Food Insecurity (11/20/2023)   Hunger Vital Sign    Worried About Running Out of Food in the Last Year: Never true    Ran Out of Food in the Last Year: Never true  Transportation Needs: No Transportation Needs (11/20/2023)   PRAPARE - Administrator, Civil Service (Medical): No    Lack of Transportation (Non-Medical): No  Physical Activity: Inactive (11/20/2023)   Exercise Vital Sign    Days of Exercise per Week: 0 days    Minutes of Exercise per Session: 0 min  Stress: Stress Concern Present (11/20/2023)   [employer] of Occupational Health - Occupational Stress Questionnaire    Feeling of Stress : Rather much  Social Connections: Moderately Integrated (11/20/2023)   Social Connection and Isolation Panel [NHANES]    Frequency of Communication with Friends and Family: More than three times a week    Frequency of Social Gatherings with Friends and Family: Three times a week    Attends Religious Services: More than 4 times per year    Active Member of Clubs or Organizations: Yes    Attends Banker  Meetings: More than 4 times per year    Marital Status: Widowed  Intimate Partner Violence: Not At Risk (06/12/2023)   Received from [facility]   HITS    Over the last 12 months how often did your partner physically hurt you?: Never    Over the last 12 months how often did your partner insult you or talk down to you?: Never    Over the last 12 months how often did your partner threaten you with physical harm?: Never    Over the last 12 months how often did your partner scream or curse at you?: Never    Family History  Problem Relation Age of Onset   Heart attack Mother 73   COPD Mother    Breast  cancer Mother 51   Hypertension Father    Diabetes Father    Heart failure Father 107   Breast cancer Paternal Aunt        cns mets   COPD Maternal Grandmother    Heart disease Paternal Grandmother        died 69   Diabetes Paternal Grandmother    Stroke Paternal Grandfather        in late 8s   Heart attack Brother 38   Colon cancer Neg Hx     Current Outpatient Medications on File Prior to Visit  Medication Sig Dispense Refill   ALPRAZolam (XANAX) 1 MG tablet TAKE 1 TABLET BY MOUTH THREE TIMES A DAY AS NEEDED FOR ANXIETY 90 tablet 0   amiodarone (PACERONE) 200 MG tablet Take 1 tablet (200 mg total) by mouth daily. 90 tablet 3   atorvastatin (LIPITOR) 40 MG tablet TAKE 1 TABLET BY MOUTH EVERY DAY 30 tablet 0   benzonatate (TESSALON PERLES) 100 MG capsule Take 1 capsule (100 mg total) by mouth 3 (three) times daily as needed. 30 capsule 0   Calcium Carb-Cholecalciferol (CALCIUM 600 + D PO) Take 600 mg by mouth daily.     cetirizine (ZYRTEC) 10 MG tablet Take 1 tablet (10 mg total) by mouth daily. 90 tablet 1   clindamycin (CLEOCIN T) 1 % lotion Apply 1 application  topically 2 (two) times daily as needed (itching/rash).     clobetasol (TEMOVATE) 0.05 % external solution Apply 1 application  topically daily as needed (Rosacea).     cyclobenzaprine (FLEXERIL) 5 MG tablet TAKE 1 TABLET  BY MOUTH AT BEDTIME AS NEEDED FOR MUSCLE SPASMS. 30 tablet 0   ELIQUIS 5 MG TABS tablet TAKE 1 TABLET BY MOUTH TWICE A DAY 60 tablet 5   fluocinonide (LIDEX) 0.05 % external solution Apply 1 application topically daily as needed (rosacea).     FLUoxetine (PROZAC) 40 MG capsule TAKE 1 CAPSULE BY MOUTH EVERY DAY 90 capsule 1   fluticasone (FLONASE) 50 MCG/ACT nasal spray Place 2 sprays into both nostrils daily. 16 g 6   furosemide (LASIX) 40 MG tablet TAKE 1 TABLET BY MOUTH EVERY DAY 90 tablet 1   gabapentin (NEURONTIN) 300 MG capsule Take 1 capsule (300 mg total) by mouth at bedtime. 90 capsule 1   methocarbamol (ROBAXIN) 750 MG tablet Take 750 mg by mouth 3 (three) times daily as needed for muscle spasms.     metroNIDAZOLE (METROCREAM) 0.75 % cream Apply 1 application topically 2 (two) times daily as needed (rosacea).      Multiple Vitamin (MULTIVITAMIN WITH MINERALS) TABS tablet Take 1 tablet by mouth daily. Centrum Silver     nitroGLYCERIN (NITROSTAT) 0.4 MG SL tablet Place 1 tablet (0.4 mg total) under the tongue every 5 (five) minutes as needed for chest pain. 90 tablet 3   omeprazole (PRILOSEC) 40 MG capsule TAKE 1 CAPSULE BY MOUTH EVERY DAY 90 capsule 1   potassium chloride (KLOR-CON) 10 MEQ tablet Take 2 tablets (20 mEq total) by mouth daily. 180 tablet 3   Propylene Glycol (SYSTANE BALANCE) 0.6 % SOLN Place 1 drop into both eyes daily as needed (Dry eyes).     sacubitril-valsartan (ENTRESTO) 49-51 MG Take 1 tablet by mouth 2 (two) times daily. 60 tablet 5   traMADol (ULTRAM) 50 MG tablet Take 1 tablet (50 mg total) by mouth every 6 (six) hours as needed for moderate pain (pain score 4-6). 30 tablet 0   No current  facility-administered medications on file prior to visit.    Allergies  Allergen Reactions   Achromycin [Tetracycline Hcl] Shortness Of Breath    Dyspnea     Penicillins     Facial angioedema Did it involve swelling of the face/tongue/throat, SOB, or low BP? Yes Did it  involve sudden or severe rash/hives, skin peeling, or any reaction on the inside of your mouth or nose? No Did you need to seek medical attention at a hospital or doctor's office? No When did it last happen?      30+ years If all above answers are "NO", may proceed with cephalosporin use.    Sulfamethoxazole-Trimethoprim Hives   Nickel Itching   Oxycodone Itching   Zanaflex [Tizanidine Hydrochloride] Other (See Comments)    Causes more pain        Physical Exam Vitals requested from patient and listed below if patient had equipment and was able to obtain at home for this virtual visit: There were no vitals filed for this visit. Estimated body mass index is 45.58 kg/m as calculated from the following:   Height as of 10/25/23: 5' 6.5" (1.689 m).   Weight as of 10/09/23: 286 lb 11.2 oz (130 kg).  EKG (optional): deferred due to virtual visit  GENERAL: alert, oriented, no acute distress detected, full vision exam deferred due to pandemic and/or virtual encounter  PSYCH/NEURO: pleasant and cooperative, no obvious depression or anxiety, speech and thought processing grossly intact, Cognitive function grossly intact  Flowsheet Row Clinical Support from 11/20/2023 in [facility]  PHQ-9 Total Score 0           11/20/2023    4:14 PM 10/09/2023    2:59 PM 08/29/2023    4:20 PM 04/27/2023    9:44 AM 03/28/2023    2:47 PM  Depression screen PHQ 2/9  Decreased Interest 0 0 3 3 0  Down, Depressed, Hopeless 0 0 1 1 1   PHQ - 2 Score 0 0 4 4 1   Altered sleeping 0 1 3 3 2   Tired, decreased energy 0 1 3 3 1   Change in appetite 0 1 3 3  0  Feeling bad or failure about yourself  0 0 0 0 0  Trouble concentrating 0 1 1 1  0  Moving slowly or fidgety/restless 0 0 0 3 0  Suicidal thoughts 0 0 0 0 0  PHQ-9 Score 0 4 14 17 4   Difficult doing work/chores Not difficult at all   Somewhat difficult Not difficult at all       01/16/2023    2:38 PM 01/23/2023    2:39 PM  03/28/2023    2:47 PM 08/29/2023    4:20 PM 11/20/2023    4:16 PM  Fall Risk  Falls in the past year? 1 1 0 1 1  Was there an injury with Fall? 1 1 0 1 1  Fall Risk Category Calculator 3 3 0 2 2  Patient at Risk for Falls Due to History of fall(s);Impaired balance/gait History of fall(s);Impaired balance/gait   No Fall Risks  Fall risk Follow up Falls evaluation completed;Education provided;Falls prevention discussed Falls evaluation completed Falls evaluation completed Falls evaluation completed Falls evaluation completed  Was at the beach, did have injury. Feels like balance is not great given hx of two hip replacements and ddd. Walks with a cane.    SUMMARY AND PLAN:  Encounter for subsequent annual wellness visit (AWV) in Medicare patient   Discussed applicable health maintenance/preventive  health measures and advised and referred or ordered per patient preferences: -she declined vaccines due -discussed adequate vit D/calcium and bone building exercises, says she is discussing medications for osteoporosis with Dr. Ardyth Harps Health Maintenance  Topic Date Due   Pneumonia Vaccine 78+ Years old (1 of 2 - PCV) Never done   DTaP/Tdap/Td (1 - Tdap) Never done   Zoster Vaccines- Shingrix (1 of 2) Never done   COVID-19 Vaccine (4 - 2024-25 season) 12/06/2023 (Originally 05/20/2023)   Colonoscopy  10/20/2024   Medicare Annual Wellness (AWV)  11/19/2024   MAMMOGRAM  05/17/2025   INFLUENZA VACCINE  Completed   DEXA SCAN  Completed   Hepatitis C Screening  Completed   HPV VACCINES  Aged [employer] and counseling on the following was provided based on the above review of health and a plan/checklist for the patient, along with additional information discussed, was provided for the patient in the patient instructions :  -Provided counseling and plan for difficulty hearing - she is undergoing evaluation with ENT and audiology -Provided counseling and plan for increased risk of falling  if applicable per above screening. Using cane/caution.  Reviewed and demonstrated safe balance exercises that can be done at home to improve balance and discussed exercise guidelines for adults with include balance exercises at least 3 days per week.  -Advised and counseled on a healthy lifestyle - including the importance of a healthy diet, regular physical activity, social connections and stress management. -Reviewed patient's current diet. Advised and counseled on a whole foods based healthy diet. A summary of a healthy diet was provided in the Patient Instructions.  -reviewed patient's current physical activity level and discussed exercise guidelines for adults. Discussed community resources and ideas for safe exercise at home to assist in meeting exercise guideline recommendations in a safe and healthy way.  -Advise yearly dental visits at minimum and regular eye exams -Advised and counseled on alcohol safe limits, risks  Follow up: see patient instructions     Patient Instructions  I really enjoyed getting to talk with you today! I am available on Tuesdays and Thursdays for virtual visits if you have any questions or concerns, or if I can be of any further assistance.   CHECKLIST FROM ANNUAL WELLNESS VISIT:  -Follow up (please call to schedule if not scheduled after visit):   -yearly for annual wellness visit with primary care office  Here is a list of your preventive care/health maintenance measures and the plan for each if any are due:  PLAN For any measures below that may be due:   Health Maintenance  Topic Date Due   Pneumonia Vaccine 70+ Years old (1 of 2 - PCV) Never done   DTaP/Tdap/Td (1 - Tdap) Never done   Zoster Vaccines- Shingrix (1 of 2) Never done   COVID-19 Vaccine (4 - 2024-25 season) 12/06/2023 (Originally 05/20/2023)   Colonoscopy  10/20/2024   Medicare Annual Wellness (AWV)  11/19/2024   MAMMOGRAM  05/17/2025   INFLUENZA VACCINE  Completed   DEXA SCAN   Completed   Hepatitis C Screening  Completed   HPV VACCINES  Aged Out    -See a dentist at least yearly  -Get your eyes checked and then per your eye specialist's recommendations  -Other issues addressed today:   -I have included below further information regarding a healthy whole foods based diet, physical activity guidelines for adults, stress management and opportunities for social connections. I hope you find this  information useful.   -----------------------------------------------------------------------------------------------------------------------------------------------------------------------------------------------------------------------------------------------------------    NUTRITION: -eat real food: lots of colorful vegetables (half the plate) and fruits -5-7 servings of vegetables and fruits per day (fresh or steamed is best), exp. 2 servings of vegetables with lunch and dinner and 2 servings of fruit per day. Berries and greens such as kale and collards are great choices.  -consume on a regular basis:  fresh fruits, fresh veggies, fish, nuts, seeds, healthy oils (such as olive oil, avocado oil), whole grains (make sure for bread/pasta/crackers/etc., that the first ingredient on label contains the word "whole"), legumes. -can eat small amounts of dairy and lean meat (no larger than the palm of your hand), but avoid processed meats such as ham, bacon, lunch meat, etc. -drink water -try to avoid fast food and pre-packaged foods, processed meat, ultra processed foods/beverages (donuts, candy, etc.) -most experts advise limiting sodium to < 2300mg  per day, should limit further is any chronic conditions such as high blood pressure, heart disease, diabetes, etc. The American Heart Association advised that < 1500mg  is is ideal -try to avoid foods/beverages that contain any ingredients with names you do not recognize  -try to avoid foods/beverages  with added sugar or  sweeteners/sweets  -try to avoid sweet drinks (including diet drinks): soda, juice, Gatorade, sweet tea, power drinks, diet drinks -try to avoid white rice, white bread, pasta (unless whole grain)  EXERCISE GUIDELINES FOR ADULTS: -if you wish to increase your physical activity, do so gradually and with the approval of your doctor -STOP and seek medical care immediately if you have any chest pain, chest discomfort or trouble breathing when starting or increasing exercise  -move and stretch your body, legs, feet and arms when sitting for long periods -Physical activity guidelines for optimal health in adults: -get at least 150 minutes per week of moderate exercise (can talk, but not sing); this is about 20-30 minutes of sustained activity 5-7 days per week or two 10-15 minute episodes of sustained activity 5-7 days per week -do some muscle building/resistance training/strength training at least 2 days per week  -balance exercises 3+ days per week:   Stand somewhere where you have something sturdy to hold onto if you lose balance    1) lift up on toes, then back down, start with 5x per day and work up to 20x   2) stand and lift one leg straight out to the side so that foot is a few inches of the floor, start with 5x each side and work up to 20x each side   3) stand on one foot, start with 5 seconds each side and work up to 20 seconds on each side  If you need ideas or help with getting more active:  -Silver sneakers https://tools.silversneakers.com  -Walk with a Doc: http://www.duncan-williams.com/  -try to include resistance (weight lifting/strength building) and balance exercises twice per week: or the following link for ideas: http://castillo-powell.com/  BuyDucts.dk  STRESS MANAGEMENT: -can try meditating, or just sitting quietly with deep breathing while intentionally relaxing all parts of your body  for 5 minutes daily -if you need further help with stress, anxiety or depression please follow up with your primary doctor or contact the wonderful folks at [facility]: [phone]  SOCIAL CONNECTIONS: -options in [employer] if you wish to engage in more social and exercise related activities:  -Silver sneakers https://tools.silversneakers.com  -Walk with a Doc: http://www.duncan-williams.com/  -Check out the [facility] Active Adults 50+ section on the [place] of [employer] (hiking clubs,  book clubs, cards and games, chess, exercise classes, aquatic classes and much more) - see the website for details: https://www.[place].gov/departments/parks-recreation/active-adults50  -YouTube has lots of exercise videos for different ages and abilities as well  -[facility] (a variety of indoor and outdoor inperson activities for adults). [phone]. [address]  -Virtual Online Classes (a variety of topics): see [email or web address] or call [phone]  -consider volunteering at a school, hospice center, church, senior center or elsewhere            Terressa Koyanagi, DO

## 2023-11-25 ENCOUNTER — Other Ambulatory Visit: Payer: Self-pay | Admitting: Cardiovascular Disease

## 2023-11-25 DIAGNOSIS — E785 Hyperlipidemia, unspecified: Secondary | ICD-10-CM

## 2023-11-27 ENCOUNTER — Other Ambulatory Visit: Payer: Self-pay | Admitting: Internal Medicine

## 2023-11-27 DIAGNOSIS — M797 Fibromyalgia: Secondary | ICD-10-CM

## 2023-12-03 ENCOUNTER — Telehealth (INDEPENDENT_AMBULATORY_CARE_PROVIDER_SITE_OTHER): Payer: Self-pay | Admitting: Otolaryngology

## 2023-12-03 NOTE — Telephone Encounter (Signed)
 Confirmed appt & location [phone] afm

## 2023-12-04 ENCOUNTER — Ambulatory Visit (INDEPENDENT_AMBULATORY_CARE_PROVIDER_SITE_OTHER): Payer: Medicare Other | Admitting: Audiology

## 2023-12-04 ENCOUNTER — Encounter (INDEPENDENT_AMBULATORY_CARE_PROVIDER_SITE_OTHER): Payer: Self-pay

## 2023-12-04 ENCOUNTER — Ambulatory Visit (INDEPENDENT_AMBULATORY_CARE_PROVIDER_SITE_OTHER): Payer: Medicare Other | Admitting: Otolaryngology

## 2023-12-04 VITALS — BP 165/81 | HR 62 | Ht 66.5 in | Wt 282.0 lb

## 2023-12-04 DIAGNOSIS — H90A21 Sensorineural hearing loss, unilateral, right ear, with restricted hearing on the contralateral side: Secondary | ICD-10-CM | POA: Diagnosis not present

## 2023-12-04 DIAGNOSIS — H9041 Sensorineural hearing loss, unilateral, right ear, with unrestricted hearing on the contralateral side: Secondary | ICD-10-CM

## 2023-12-04 DIAGNOSIS — H938X2 Other specified disorders of left ear: Secondary | ICD-10-CM

## 2023-12-04 DIAGNOSIS — H6992 Unspecified Eustachian tube disorder, left ear: Secondary | ICD-10-CM | POA: Diagnosis not present

## 2023-12-04 NOTE — Progress Notes (Unsigned)
  [address], [address] [place], [place] [address] [phone]  Audiological Evaluation    Name: Kathy [name]     DOB:   Mar 08, 1951      MRN:   [id number]                                                                                     Service Date: 12/04/2023     Accompanied by: unaccompanied   Patient comes today after Dr. Allena Katz, ENT sent a referral for a hearing evaluation due to concerns with hearing loss.   Symptoms Yes Details  Hearing loss  [x]  Known left sided hearing loss ( reports Dr. Ezzard Standing said she had left about 20%) since 3 years ago after a virus.  Recently noticed that she has to turn up the TV higher in order to hear it. She reports that she declined in the past the option of a cochlear implant.  Tinnitus  []    Ear pain/ infections/pressure  []    Balance problems  [x]  Patient denies any spinning and reports balance issues which she attributed to her hip/back issues.  Noise exposure history  []    Previous ear surgeries  []    Family history of hearing loss  []    Amplification  []    Other  []      Otoscopy: Right ear: Clear external ear canals and notable landmarks visualized on the tympanic membrane. Left ear:  Clear external ear canals and notable landmarks visualized on the tympanic membrane.  Tympanometry: Right ear: Type A- Normal external ear canal volume with normal middle ear pressure and tympanic membrane compliance Left ear: Type A- Normal external ear canal volume with normal middle ear pressure and tympanic membrane compliance     Pure tone Audiometry: Right ear- Moderate to profound sensorineural hearing loss from 125 Hz - 8000 Hz. Left ear-   Normal to borderline normal hearing from [phone].   Speech Audiometry: Right ear- Speech Awareness Threshold (SAT) was observed at 75 dBHL, with contralateral masking. Left ear-Speech Reception Threshold (SRT) was obtained at 15 dBHL.   Word Recognition Score Tested using NU-6  (MLV) Right ear: Could not test due to degree of hearing loss. Left ear: 100% was obtained at a presentation level of 55 dBHL with contralateral masking which is deemed as  excellent.   The hearing test results were completed under headphones and results are deemed to be of good reliability. Test technique:  conventional      Recommendations: Follow up with ENT as scheduled for today. Return for a hearing evaluation if concerns with hearing changes arise or per MD recommendation. Consider a communication needs assessment after medical clearance for hearing aids is obtained.   Leanza Shepperson MARIE LEROUX-MARTINEZ, AUD

## 2023-12-04 NOTE — Progress Notes (Incomplete)
 ADVANCED HEART FAILURE CLINIC NOTE  Referring Physician: Philip Aspen, Almira Bar*  Primary Care: Philip Aspen, Limmie Patricia, MD Primary Cardiologist: Dr. Rennis Golden  HF: Dr. Gasper Lloyd  HPI: Kathy [name] is a 73 y.o. female with HFpEF, paroxysmal atrial fibrillation, OSA (CPAP), hyperlipidemia and HTN presenting today for evaluation. Kathy [name] cardiac history dates back to 2019 when she presented to the ER  w/ complaints of dyspnea. She was found to have atrial fibrillation  w/ TTE demonstrating preserved LV function and severe LVH. She converted to NSR with metoprolol. Since that time she has also underwent LHC w/ nonobstructive CAD. Over the past 2 years she has continued to struggle with symptomatic paroxysmal atrial fibrillation. In 11/23 she was admitted with acute on chronic HFpEF; TTE at that time with dilated RV w/ moderately reduced systolic function. She was diuresed and discharged home. Since that time she has her functional status remains fairly limited.  She can perform all ADLs independently but frequently becomes very fatigued and short of breath after walking over 30 to 40 feet.  No PND and currently lower extremity edema is fairly well-controlled however she does quickly become hypervolemic with any dietary indiscretion. Kathy [name] has undergone RHC confirming RV dysfunction with reduced cardiac output/index likely secondary to long term OSA and severe TR. In addition, she has had afib for a prolonged period; underwent TEE/DCCV with conversion to sinus rhythm. cMRI in April 2024 demonstrated preserved LV function, severely dilated RV with RVEF 58%, D-shaped interventricular septum suggesting degree of RV pressure/volume overload, moderate to severe TR.  Interval hx:  She unfortunately had a fall recently leading to severe back pain and a fracture of her right wrist.  She is now planning to undergo kyphoplasty and until that time has become more reliant on her walker.  From a heart  failure/pulmonary hypertension standpoint, she is doing very well.  She has had no trouble with lower extremity edema.  Her shortness of breath is very minimal at this time.  She continues to recover from the loss of her son.    Current Outpatient Medications  Medication Sig Dispense Refill   ALPRAZolam (XANAX) 1 MG tablet TAKE 1 TABLET BY MOUTH THREE TIMES A DAY AS NEEDED FOR ANXIETY 90 tablet 0   amiodarone (PACERONE) 200 MG tablet Take 1 tablet (200 mg total) by mouth daily. 90 tablet 3   atorvastatin (LIPITOR) 40 MG tablet TAKE 1 TABLET BY MOUTH EVERY DAY 90 tablet 0   benzonatate (TESSALON PERLES) 100 MG capsule Take 1 capsule (100 mg total) by mouth 3 (three) times daily as needed. 30 capsule 0   Calcium Carb-Cholecalciferol (CALCIUM 600 + D PO) Take 600 mg by mouth daily.     cetirizine (ZYRTEC) 10 MG tablet Take 1 tablet (10 mg total) by mouth daily. 90 tablet 1   clindamycin (CLEOCIN T) 1 % lotion Apply 1 application  topically 2 (two) times daily as needed (itching/rash).     clobetasol (TEMOVATE) 0.05 % external solution Apply 1 application  topically daily as needed (Rosacea).     cyclobenzaprine (FLEXERIL) 5 MG tablet TAKE 1 TABLET BY MOUTH AT BEDTIME AS NEEDED FOR MUSCLE SPASMS. 30 tablet 0   ELIQUIS 5 MG TABS tablet TAKE 1 TABLET BY MOUTH TWICE A DAY 60 tablet 5   fluocinonide (LIDEX) 0.05 % external solution Apply 1 application topically daily as needed (rosacea).     FLUoxetine (PROZAC) 40 MG capsule TAKE 1 CAPSULE BY MOUTH EVERY DAY 90 capsule  1   fluticasone (FLONASE) 50 MCG/ACT nasal spray Place 2 sprays into both nostrils daily. 16 g 6   furosemide (LASIX) 40 MG tablet TAKE 1 TABLET BY MOUTH EVERY DAY 90 tablet 1   gabapentin (NEURONTIN) 300 MG capsule TAKE 1 CAPSULE BY MOUTH EVERYDAY AT BEDTIME 90 capsule 1   methocarbamol (ROBAXIN) 750 MG tablet Take 750 mg by mouth 3 (three) times daily as needed for muscle spasms.     metroNIDAZOLE (METROCREAM) 0.75 % cream Apply 1  application topically 2 (two) times daily as needed (rosacea).      Multiple Vitamin (MULTIVITAMIN WITH MINERALS) TABS tablet Take 1 tablet by mouth daily. Centrum Silver     nitroGLYCERIN (NITROSTAT) 0.4 MG SL tablet Place 1 tablet (0.4 mg total) under the tongue every 5 (five) minutes as needed for chest pain. 90 tablet 3   omeprazole (PRILOSEC) 40 MG capsule TAKE 1 CAPSULE BY MOUTH EVERY DAY 90 capsule 1   potassium chloride (KLOR-CON) 10 MEQ tablet Take 2 tablets (20 mEq total) by mouth daily. 180 tablet 3   Propylene Glycol (SYSTANE BALANCE) 0.6 % SOLN Place 1 drop into both eyes daily as needed (Dry eyes).     sacubitril-valsartan (ENTRESTO) 49-51 MG Take 1 tablet by mouth 2 (two) times daily. 60 tablet 5   traMADol (ULTRAM) 50 MG tablet Take 1 tablet (50 mg total) by mouth every 6 (six) hours as needed for moderate pain (pain score 4-6). 30 tablet 0   No current facility-administered medications for this visit.    PHYSICAL EXAM: There were no vitals filed for this visit. GENERAL: NAD Lungs- *** CARDIAC:  JVP: *** cm          Normal rate with regular rhythm. *** murmur.  Pulses ***. *** edema.  ABDOMEN: Soft, non-tender, non-distended.  EXTREMITIES: Warm and well perfused.  NEUROLOGIC: No obvious FND    DATA REVIEW  ECG: NSR 61 bpm  cMRI 12/25/22: LVEF 69%, RVEF 58%, RV severely dilated, D-shaped IVS suggestive of RV pressure/volume overload, nonspecific LGE at basal to mid inferoseptal RV insertion site, moderate to severe TR with regurgitant fraction of 36% and regurgitant volume of 65 cc  ECHO: 07/27/22: LVEF 50-55%, RV is dilated with mild to moderately reduced systolic function. RA is severely dilated. There is severe eccentric TR.    CATH: 06/26/18:  Ost RCA lesion is 30% stenosed. The left ventricular systolic function is normal. LV end diastolic pressure is mildly elevated. The left ventricular ejection fraction is 55-65% by visual estimate.   10/10/22: RA:                   13 mmHg (mean) RV:                  41/10-15 mmHg PA:                  40/15 mmHg (21 mean) PCWP:            14 mmHg (mean)                                      Estimated Fick CO/CI   4.39 L/min, 1.9 L/min/m2 Thermodilution CO/CI   3.61 L/min, 1.5 L/min/m2  TPG                 7  mmHg                                              PVR                 1.6-1.94 Wood Units  PAPi                1.9       IMPRESSION: Severely elevated pre-capillary pressures.  Normal left sided filling pressures Normal PVR and PA mean.  Severely reduced cardiac index likely secondary to suboptimal RV function. (ZOX:WRUE of 1, PAPi of 1.9. )   ASSESSMENT & PLAN:  RV Failure/Severe TR - Echocardiograms dating back to 2019 demonstrate severe TR. On her initial TTE in 2019 she has severe TR with mildly dilated RV and preserved LV function. Over the past 3 years, echocardiograms demonstrate gradual progression of TR and worsening of RV function.  Uncertain if this is primary TR or a sequale of underlying pulmonary hypertension.  - RHC/TEE on 10/10/22 confirming RV failure with severe TR. Likely has had prolonged atrial fibrillation and OSA.  - Cardiac MRI 04/24 with RVEF 58%, severely dilated RV and evidence of moderate to severe TR. - Initially noted significant improvement in functional status with cardioversion from Afib >> sinus rhythm. More shortness of breath last few days. See below. - Will consider triclip in the future -Euvolemic on exam today with JVP less than 7 cm.  Doing very well on her current medication regimen.  No changes at this time. -Repeat BMP/BNP tomorrow  2. Heart failure w/ preserved EF  - Most recent TTE w/ indeterminate diastolic function; tissue doppler not consistent with significant diastolic dysfunction.  - NYHA IIB symptoms - Continue Entresto 49/51 mg daily -Lasix 20mg  daily. Euvolemic on exam today.  - labs tomorrow; will  follow.   3. Atrial fibrillation  - Continue Apixaban 5mg  BID - Now s/p TEE/DCCV in January 2024. - In normal sinus rhythm today. - Continue Amiodarone 200mg  BID.  -Will repeat TSH/CMP  4. OSA  - AHI 20/hr with O2 sats as low as 79%.  - Now tolerating CPAP well.  -Compliant with CPAP  5. Diarrhea -Resolved  6. Pre-operative clearance - She is stable from a RV failure standpoint; euvolemic on exam. Okay to proceed with kyphoplasty; low to moderate risk w/ general anesthesia.  - Hold apixaban 3 days prior.   Shanta Dorvil Advanced Heart Failure Mechanical Circulatory Support

## 2023-12-04 NOTE — Progress Notes (Signed)
 Dear Dr. Philip Aspen, Here is my assessment for our mutual patient, Kathy [name]. Thank you for allowing me the opportunity to care for your patient. Please do not hesitate to contact me should you have any other questions. Sincerely, Dr. Jovita Kussmaul  Otolaryngology Clinic Note Referring provider: Dr. Philip Aspen HPI:  Kathy [name] is a 73 y.o. female kindly referred by Dr. Philip Aspen for evaluation of hearing loss.  Initial visit (10/2023): Patient reports: sudden hearing loss in right ear in 2016 after URI, saw Dr. Ezzard Standing for it, and has significant hearing loss. No change in that ear. She last saw him in 2022, and noted to have normal hearing. She reports that over the past few months, she reports that she noted herself turning the TV up and her son has noted hearing is worse.  About 4-5 weeks ago, she was also diagnosed with a URI and had PND, cough, congestion; no fevers; given tessalon, flonase, and zyrtec and she reports that she is doing better. She reports that she had some ear discomfort (intermittent) but has not happened over last few days -- left ear does feel like there is pressure. No discolored drainage, no facial pressure/pain currently. No frequent sinus infections.  Patient denies: vertigo, drainage, tinnitus Patient additionally denies: deep pain in ear canal, eustachian tube symptoms such as popping/crackling (currently, but had some recently), sensitive to pressure changes Patient also denies barotrauma, vestibular suppressant use, ototoxic medication use Prior ear surgery: no Does not get frequent ear infections  --------------------------------------------------------- 12/04/2023 Doing much better - no left sided symptoms after prednisone. Denies pain, fullness, vertigo, drainage. She did have a hearing test today.  H&N Surgery: no  GLP-1: no AP/AC: Eliquis  Tobacco: no.  Lives in [place], [place].  PMHx: A-fib, HTN, CHF, OA, CKD,  Depression (grieving recent loss of her son), DVT, prior migraines  Independent Review of Additional Tests or Records:  Dr. Ezzard Standing (11/25/2020): Left ear hearing loss, had sudden right SNHL in 2016; noted normal left ear hearing; no loss; audio: normal hearing left ear SRT 10dB; type A tymp; right ear severe SNHL with SRT 100dB Dr. Philip Aspen, MD [facility]) - 2025: noted left ear hearing loss; need ref to ENT Dr. Caryl Never (FM) 10/09/2023: noted two week history of cough, no red back sx, GERD on omeprazole; Dx: Rhinitis and cough; Rx: flonase and zyrtec Dr. Philip Aspen (FM) 10/17/2023: noted 4 week history of head cold sx, PND, Cough, now left ear discomfort with popping sensation with congestion. Dx: Rhinitis;Rx: Lupe Carney CT Head 11/32023 independently interpreted with respect to sinuses and ears: no significant sinonasal disease; cuts thick but mastoids and ME well aerated; no obvious otic capsule or ossicular chain abnormality MRI Brain 12/07/2014 independently interpreted with respect to ears - no mastoid effusion; no retrocochlear lesion noted (done after hearing loss)  11/2023 Audiogram was independently reviewed and interpreted by me and it reveals - essentially normal hearing thresholds left ear with profound SNHL right ear./ A/A tymps   SNHL= Sensorineural hearing loss  PMH/Meds/All/SocHx/FamHx/ROS:   Past Medical History:  Diagnosis Date   Adenomatous colon polyp    2012   Anxiety and depression    Arthritis    Osteoarthritis, Dr Despina Hick   Cancer [facility])    skin - left foot excised   Chronic diastolic CHF (congestive heart failure) (HCC)    DDD (degenerative disc disease), cervical    Deaf, right    DVT (deep venous thrombosis) (HCC)    RLE DVT  08/2004, post right knee arthroscopy   Dysrhythmia    A.Fib   Fibromyalgia    Dr Azzie Roup, Rheu   GERD (gastroesophageal reflux disease)    H/O hiatal hernia    Headache(784.0)    PAST HX MILD MIGRAINES - resolved per  patient 08/18/20   History of kidney stones    passed stones   HTN (hypertension)    IBS (irritable bowel syndrome)    Internal hemorrhoid    Lumbar radiculopathy    Obesity    OSA on CPAP    Moderate with AHI 20/hr with nocturnal hypoxemia on CPAP   PAF (paroxysmal atrial fibrillation) (HCC) 06/2018   Pulmonary hypertension (HCC)      Past Surgical History:  Procedure Laterality Date   ABDOMINAL HYSTERECTOMY     dysfunctional menses   APPENDECTOMY     BREAST EXCISIONAL BIOPSY Right    CARDIAC CATHETERIZATION  06/26/2018   CARDIOVERSION N/A 10/10/2022   Procedure: CARDIOVERSION;  Surgeon: Dorthula Nettles, DO;  Location: [facility] ENDOSCOPY;  Service: Cardiovascular;  Laterality: N/A;   CHOLECYSTECTOMY     COLONOSCOPY  05/27/2002, 08/08/11   2003 diverticulosis, hemorrhoids 2012 same + cecal polyp   EYE SURGERY     KNEE ARTHROSCOPY  2008   right   KYPHOPLASTY Bilateral 08/19/2020   Procedure: KYPHOPLASTY LUMBAR ONE;  Surgeon: Bedelia Person, MD;  Location: [facility] OR;  Service: Neurosurgery;  Laterality: Bilateral;  posterior   KYPHOPLASTY N/A 07/31/2023   Procedure: KYPHOPLASTY LUMBAR FOUR;  Surgeon: Bedelia Person, MD;  Location: [facility] OR;  Service: Neurosurgery;  Laterality: N/A;   LEFT HEART CATH AND CORONARY ANGIOGRAPHY N/A 06/26/2018   Procedure: LEFT HEART CATH AND CORONARY ANGIOGRAPHY;  Surgeon: Iran Ouch, MD;  Location: [facility] INVASIVE CV LAB;  Service: Cardiovascular;  Laterality: N/A;   MELANOMA EXCISION Left 07/2013   foot   RIGHT HEART CATH N/A 10/10/2022   Procedure: RIGHT HEART CATH;  Surgeon: Dorthula Nettles, DO;  Location: [facility] INVASIVE CV LAB;  Service: Cardiovascular;  Laterality: N/A;   TEE WITHOUT CARDIOVERSION N/A 10/10/2022   Procedure: TRANSESOPHAGEAL ECHOCARDIOGRAM (TEE);  Surgeon: Dorthula Nettles, DO;  Location: [facility] ENDOSCOPY;  Service: Cardiovascular;  Laterality: N/A;   TOTAL HIP ARTHROPLASTY  02/2010   left ; Dr Despina Hick   TOTAL HIP ARTHROPLASTY   03/18/2012   Procedure: TOTAL HIP ARTHROPLASTY;  Surgeon: Loanne Drilling, MD;  Location: [facility] ORS;  Service: Orthopedics;  Laterality: Right;   UPPER GASTROINTESTINAL ENDOSCOPY  04/21/2009, 08/08/11   2010 gastritis ;2012 small hiatal hernia. Dr Leone Payor   WRIST FRACTURE SURGERY Left     Family History  Problem Relation Age of Onset   Heart attack Mother 43   COPD Mother    Breast cancer Mother 66   Hypertension Father    Diabetes Father    Heart failure Father 72   Breast cancer Paternal Aunt        cns mets   COPD Maternal Grandmother    Heart disease Paternal Grandmother        died 42   Diabetes Paternal Grandmother    Stroke Paternal Grandfather        in late 57s   Heart attack Brother 40   Colon cancer Neg Hx      Social Connections: Moderately Integrated (11/20/2023)   Social Connection and Isolation Panel [NHANES]    Frequency of Communication with Friends and Family: More than three times a week    Frequency of Social Gatherings  with Friends and Family: Three times a week    Attends Religious Services: More than 4 times per year    Active Member of Clubs or Organizations: Yes    Attends Banker Meetings: More than 4 times per year    Marital Status: Widowed      Current Outpatient Medications:    ALPRAZolam (XANAX) 1 MG tablet, TAKE 1 TABLET BY MOUTH THREE TIMES A DAY AS NEEDED FOR ANXIETY, Disp: 90 tablet, Rfl: 0   amiodarone (PACERONE) 200 MG tablet, Take 1 tablet (200 mg total) by mouth daily., Disp: 90 tablet, Rfl: 3   atorvastatin (LIPITOR) 40 MG tablet, TAKE 1 TABLET BY MOUTH EVERY DAY, Disp: 90 tablet, Rfl: 0   benzonatate (TESSALON PERLES) 100 MG capsule, Take 1 capsule (100 mg total) by mouth 3 (three) times daily as needed., Disp: 30 capsule, Rfl: 0   Calcium Carb-Cholecalciferol (CALCIUM 600 + D PO), Take 600 mg by mouth daily., Disp: , Rfl:    cetirizine (ZYRTEC) 10 MG tablet, Take 1 tablet (10 mg total) by mouth daily., Disp: 90 tablet, Rfl:  1   clindamycin (CLEOCIN T) 1 % lotion, Apply 1 application  topically 2 (two) times daily as needed (itching/rash)., Disp: , Rfl:    clobetasol (TEMOVATE) 0.05 % external solution, Apply 1 application  topically daily as needed (Rosacea)., Disp: , Rfl:    cyclobenzaprine (FLEXERIL) 5 MG tablet, TAKE 1 TABLET BY MOUTH AT BEDTIME AS NEEDED FOR MUSCLE SPASMS., Disp: 30 tablet, Rfl: 0   ELIQUIS 5 MG TABS tablet, TAKE 1 TABLET BY MOUTH TWICE A DAY, Disp: 60 tablet, Rfl: 5   fluocinonide (LIDEX) 0.05 % external solution, Apply 1 application topically daily as needed (rosacea)., Disp: , Rfl:    FLUoxetine (PROZAC) 40 MG capsule, TAKE 1 CAPSULE BY MOUTH EVERY DAY, Disp: 90 capsule, Rfl: 1   fluticasone (FLONASE) 50 MCG/ACT nasal spray, Place 2 sprays into both nostrils daily., Disp: 16 g, Rfl: 6   furosemide (LASIX) 40 MG tablet, TAKE 1 TABLET BY MOUTH EVERY DAY, Disp: 90 tablet, Rfl: 1   gabapentin (NEURONTIN) 300 MG capsule, TAKE 1 CAPSULE BY MOUTH EVERYDAY AT BEDTIME, Disp: 90 capsule, Rfl: 1   methocarbamol (ROBAXIN) 750 MG tablet, Take 750 mg by mouth 3 (three) times daily as needed for muscle spasms., Disp: , Rfl:    metroNIDAZOLE (METROCREAM) 0.75 % cream, Apply 1 application topically 2 (two) times daily as needed (rosacea). , Disp: , Rfl:    Multiple Vitamin (MULTIVITAMIN WITH MINERALS) TABS tablet, Take 1 tablet by mouth daily. Centrum Silver, Disp: , Rfl:    nitroGLYCERIN (NITROSTAT) 0.4 MG SL tablet, Place 1 tablet (0.4 mg total) under the tongue every 5 (five) minutes as needed for chest pain., Disp: 90 tablet, Rfl: 3   omeprazole (PRILOSEC) 40 MG capsule, TAKE 1 CAPSULE BY MOUTH EVERY DAY, Disp: 90 capsule, Rfl: 1   potassium chloride (KLOR-CON) 10 MEQ tablet, Take 2 tablets (20 mEq total) by mouth daily., Disp: 180 tablet, Rfl: 3   Propylene Glycol (SYSTANE BALANCE) 0.6 % SOLN, Place 1 drop into both eyes daily as needed (Dry eyes)., Disp: , Rfl:    sacubitril-valsartan (ENTRESTO) 49-51 MG,  Take 1 tablet by mouth 2 (two) times daily., Disp: 60 tablet, Rfl: 5   traMADol (ULTRAM) 50 MG tablet, Take 1 tablet (50 mg total) by mouth every 6 (six) hours as needed for moderate pain (pain score 4-6)., Disp: 30 tablet, Rfl: 0   Physical  Exam:   BP (!) 165/81 (BP Location: Right Arm, Patient Position: Sitting, Cuff Size: Large)   Pulse 62   Ht 5' 6.5" (1.689 m)   Wt 282 lb (127.9 kg)   SpO2 (!) 85%   BMI 44.83 kg/m   Salient findings:  CN II-XII intact Noted Bilateral EAC clear and TM intact with well pneumatized middle ear spaces, mild global retraction b/l Weber 512: left Rinne 512: AC > BC b/l Anterior rhinoscopy: Septum relatively midline; bilateral inferior turbinates without significant hypertrophy; no purulence No respiratory distress or stridor  Seprately Identifiable Procedures:  None today   Impression & Plans:  Verdella Laidlaw is a 73 y.o. female with:  1. ETD (Eustachian tube dysfunction), left   2. Ear fullness, left   3. 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 Mild/borderline HL left ear. She was treated with pred and flonase for left ETD which has resolved. She reports she is doing well. We also discussed hearing options including CROS and CI. We had a long discussion about CI - she reports she will think about this and consider the R/B/A.  F/u 18 months with audio, sooner if any issues on left. She knows to call if left sided issues  See below regarding exact medications prescribed this encounter including dosages and route: No orders of the defined types were placed in this encounter.     Thank you for allowing me the opportunity to care for your patient. Please do not hesitate to contact me should you have any other questions.  Sincerely, Jovita Kussmaul, MD Otolaryngologist (ENT), [facility] Phone: [phone] Fax: [phone]  12/04/2023, 4:39 PM   I have personally spent 32 minutes involved in face-to-face and non-face-to-face activities for this patient on the day of the visit.  Professional time spent excludes any procedures performed but includes the following activities, in addition to those noted in the documentation: preparing to see the patient (review of outside documentation and results), performing a medically appropriate examination, extensive counseling regarding hearing rehabilitation options, documenting in the electronic health record, interpreting results

## 2023-12-05 ENCOUNTER — Ambulatory Visit (HOSPITAL_COMMUNITY)
Admission: RE | Admit: 2023-12-05 | Discharge: 2023-12-05 | Disposition: A | Discharge status: Home / Self Care | Payer: Medicare Other | Source: Ambulatory Visit | Attending: Cardiology | Admitting: Cardiology

## 2023-12-05 ENCOUNTER — Encounter (HOSPITAL_COMMUNITY): Payer: Self-pay | Admitting: Cardiology

## 2023-12-05 VITALS — BP 160/69 | HR 57 | Ht 66.5 in | Wt 285.0 lb

## 2023-12-05 DIAGNOSIS — E785 Hyperlipidemia, unspecified: Secondary | ICD-10-CM | POA: Insufficient documentation

## 2023-12-05 DIAGNOSIS — I2510 Atherosclerotic heart disease of native coronary artery without angina pectoris: Secondary | ICD-10-CM | POA: Diagnosis not present

## 2023-12-05 DIAGNOSIS — Z7901 Long term (current) use of anticoagulants: Secondary | ICD-10-CM | POA: Insufficient documentation

## 2023-12-05 DIAGNOSIS — Z713 Dietary counseling and surveillance: Secondary | ICD-10-CM | POA: Diagnosis not present

## 2023-12-05 DIAGNOSIS — I110 Hypertensive heart disease with heart failure: Secondary | ICD-10-CM | POA: Diagnosis not present

## 2023-12-05 DIAGNOSIS — I50810 Right heart failure, unspecified: Secondary | ICD-10-CM

## 2023-12-05 DIAGNOSIS — G4733 Obstructive sleep apnea (adult) (pediatric): Secondary | ICD-10-CM | POA: Insufficient documentation

## 2023-12-05 DIAGNOSIS — I480 Paroxysmal atrial fibrillation: Secondary | ICD-10-CM | POA: Diagnosis not present

## 2023-12-05 DIAGNOSIS — I071 Rheumatic tricuspid insufficiency: Secondary | ICD-10-CM | POA: Diagnosis not present

## 2023-12-05 DIAGNOSIS — Z6841 Body Mass Index (BMI) 40.0 and over, adult: Secondary | ICD-10-CM | POA: Diagnosis not present

## 2023-12-05 DIAGNOSIS — E669 Obesity, unspecified: Secondary | ICD-10-CM | POA: Diagnosis not present

## 2023-12-05 DIAGNOSIS — Z5181 Encounter for therapeutic drug level monitoring: Secondary | ICD-10-CM

## 2023-12-05 DIAGNOSIS — Z79899 Other long term (current) drug therapy: Secondary | ICD-10-CM | POA: Insufficient documentation

## 2023-12-05 DIAGNOSIS — I5032 Chronic diastolic (congestive) heart failure: Secondary | ICD-10-CM | POA: Insufficient documentation

## 2023-12-05 LAB — BRAIN NATRIURETIC PEPTIDE: B Natriuretic Peptide: 247 pg/mL — ABNORMAL HIGH (ref 0.0–100.0)

## 2023-12-05 LAB — COMPREHENSIVE METABOLIC PANEL
ALT: 29 U/L (ref 0–44)
AST: 33 U/L (ref 15–41)
Albumin: 3.6 g/dL (ref 3.5–5.0)
Alkaline Phosphatase: 68 U/L (ref 38–126)
Anion gap: 9 (ref 5–15)
BUN: 17 mg/dL (ref 8–23)
CO2: 28 mmol/L (ref 22–32)
Calcium: 9.2 mg/dL (ref 8.9–10.3)
Chloride: 97 mmol/L — ABNORMAL LOW (ref 98–111)
Creatinine, Ser: 1.21 mg/dL — ABNORMAL HIGH (ref 0.44–1.00)
GFR, Estimated: 48 mL/min — ABNORMAL LOW (ref >60)
Glucose, Bld: 98 mg/dL (ref 70–99)
Potassium: 4.9 mmol/L (ref 3.5–5.1)
Sodium: 134 mmol/L — ABNORMAL LOW (ref 135–145)
Total Bilirubin: 0.5 mg/dL (ref 0.0–1.2)
Total Protein: 6.3 g/dL — ABNORMAL LOW (ref 6.5–8.1)

## 2023-12-05 LAB — TSH: TSH: 1.336 u[IU]/mL (ref 0.350–4.500)

## 2023-12-05 MED ORDER — ENTRESTO 97-103 MG PO TABS: 1.0000 | ORAL_TABLET | Freq: Two times a day (BID) | ORAL | 3 refills | Status: DC

## 2023-12-05 NOTE — Patient Instructions (Addendum)
 Medication Changes:  INCREASE ENTRESTO 97-103MG  BY MOUTH TWICE DAILY.   Lab Work:  Labs done today, your results will be available in MyChart, we will contact you for abnormal readings.     Follow-Up in: IN 4 MONTHS WITH ECHO. GIVE OUR OFFICE A CALL AROUND JUNE TO SCHEDULE JULY APPOINTMENT.   At the [facility], you and your health needs are our priority. We have a designated team specialized in the treatment of Heart Failure. This Care Team includes your primary Heart Failure Specialized Cardiologist (physician), Advanced Practice Providers (APPs- Physician Assistants and Nurse Practitioners), and Pharmacist who all work together to provide you with the care you need, when you need it.   You may see any of the following providers on your designated Care Team at your next follow up:  Dr. Arvilla Meres Dr. Marca Ancona Dr. Dorthula Nettles Dr. Theresia Bough Tonye Becket, NP Robbie Lis, [place] [facility], [place] Brynda Peon, NP [place] Lee, NP Karle Plumber, PharmD   Please be sure to bring in all your medications bottles to every appointment.   Need to Contact us:  If you have any questions or concerns before your next appointment please send [name] a message through [place] or call our office at [phone].    TO LEAVE A MESSAGE FOR THE NURSE SELECT OPTION 2, PLEASE LEAVE A MESSAGE INCLUDING: YOUR NAME DATE OF BIRTH CALL BACK NUMBER REASON FOR CALL**this is important as we prioritize the call backs  YOU WILL RECEIVE A CALL BACK THE SAME DAY AS LONG AS YOU CALL BEFORE 4:00 PM

## 2023-12-21 ENCOUNTER — Other Ambulatory Visit: Payer: Self-pay | Admitting: Internal Medicine

## 2023-12-21 DIAGNOSIS — F419 Anxiety disorder, unspecified: Secondary | ICD-10-CM

## 2023-12-24 ENCOUNTER — Other Ambulatory Visit (HOSPITAL_COMMUNITY): Payer: Self-pay | Admitting: Cardiology

## 2023-12-24 MED ORDER — ENTRESTO 97-103 MG PO TABS: 1.0000 | ORAL_TABLET | Freq: Two times a day (BID) | ORAL | 3 refills | Status: DC

## 2024-01-02 ENCOUNTER — Other Ambulatory Visit (HOSPITAL_COMMUNITY): Payer: Self-pay | Admitting: Cardiology

## 2024-01-08 ENCOUNTER — Other Ambulatory Visit: Payer: Self-pay | Admitting: Internal Medicine

## 2024-01-08 ENCOUNTER — Other Ambulatory Visit (HOSPITAL_COMMUNITY): Payer: Self-pay | Admitting: Cardiology

## 2024-01-08 DIAGNOSIS — F419 Anxiety disorder, unspecified: Secondary | ICD-10-CM

## 2024-01-30 ENCOUNTER — Encounter: Admitting: Internal Medicine

## 2024-01-30 ENCOUNTER — Encounter: Payer: Self-pay | Admitting: Internal Medicine

## 2024-01-30 DIAGNOSIS — N6311 Unspecified lump in the right breast, upper outer quadrant: Secondary | ICD-10-CM

## 2024-01-30 NOTE — Progress Notes (Signed)
 This encounter was created in error - please disregard.

## 2024-02-13 ENCOUNTER — Ambulatory Visit
Admission: RE | Admit: 2024-02-13 | Discharge: 2024-02-13 | Disposition: A | Discharge status: Home / Self Care | Source: Ambulatory Visit | Attending: Internal Medicine | Admitting: Internal Medicine

## 2024-02-13 DIAGNOSIS — N6311 Unspecified lump in the right breast, upper outer quadrant: Secondary | ICD-10-CM

## 2024-02-19 ENCOUNTER — Other Ambulatory Visit: Payer: Self-pay | Admitting: Internal Medicine

## 2024-02-19 DIAGNOSIS — F419 Anxiety disorder, unspecified: Secondary | ICD-10-CM

## 2024-03-17 ENCOUNTER — Other Ambulatory Visit: Payer: Self-pay | Admitting: Internal Medicine

## 2024-03-17 DIAGNOSIS — F419 Anxiety disorder, unspecified: Secondary | ICD-10-CM

## 2024-03-17 NOTE — Telephone Encounter (Unsigned)
 Copied from CRM [id number]. Topic: Clinical - Medication Refill >> Mar 17, 2024  4:30 PM Thersia C wrote: Medication: ALPRAZolam  (XANAX ) 1 MG tablet -- patient is due for a refill on the 3rd July , wanted to know if she could get that alil earlier since it is a holiday went   Has the patient contacted their pharmacy? Yes (Agent: If no, request that the patient contact the pharmacy for the refill. If patient does not wish to contact the pharmacy document the reason why and proceed with request.) (Agent: If yes, when and what did the pharmacy advise?)  This is the patient's preferred pharmacy:   [id number] [place], [place] - [address] AT [facility] [address] [place] [address] Phone: [phone] Fax: [phone]  Is this the correct pharmacy for this prescription? Yes If no, delete pharmacy and type the correct one.   Has the prescription been filled recently? No  Is the patient out of the medication? Yes  Has the patient been seen for an appointment in the last year OR does the patient have an upcoming appointment? Yes  Can we respond through MyChart? Yes  Agent: Please be advised that Rx refills may take up to 3 business days. We ask that you follow-up with your pharmacy.

## 2024-03-18 MED ORDER — ALPRAZOLAM 1 MG PO TABS: 1.0000 mg | ORAL_TABLET | Freq: Three times a day (TID) | ORAL | 0 refills | Status: DC | PRN

## 2024-03-20 ENCOUNTER — Other Ambulatory Visit: Payer: Self-pay | Admitting: Internal Medicine

## 2024-04-03 ENCOUNTER — Other Ambulatory Visit: Payer: Self-pay | Admitting: Internal Medicine

## 2024-04-03 DIAGNOSIS — Z1231 Encounter for screening mammogram for malignant neoplasm of breast: Secondary | ICD-10-CM

## 2024-04-03 DIAGNOSIS — J302 Other seasonal allergic rhinitis: Secondary | ICD-10-CM

## 2024-04-08 ENCOUNTER — Other Ambulatory Visit (HOSPITAL_COMMUNITY): Payer: Self-pay | Admitting: Cardiology

## 2024-04-16 ENCOUNTER — Other Ambulatory Visit (HOSPITAL_COMMUNITY): Payer: Self-pay | Admitting: Cardiology

## 2024-04-17 ENCOUNTER — Other Ambulatory Visit: Payer: Self-pay | Admitting: Internal Medicine

## 2024-04-17 ENCOUNTER — Ambulatory Visit

## 2024-04-17 DIAGNOSIS — F419 Anxiety disorder, unspecified: Secondary | ICD-10-CM

## 2024-04-17 MED ORDER — ALPRAZOLAM 1 MG PO TABS
1.0000 mg | ORAL_TABLET | Freq: Three times a day (TID) | ORAL | 0 refills | Status: DC | PRN
Start: 2024-04-17 — End: 2024-05-20

## 2024-04-17 NOTE — Telephone Encounter (Signed)
 Copied from CRM [id number]. Topic: Clinical - Medication Refill >> Apr 17, 2024 11:48 AM Burnard DEL wrote: Medication: ALPRAZolam  (XANAX ) 1 MG tablet  Has the patient contacted their pharmacy? Yes (Agent: If no, request that the patient contact the pharmacy for the refill. If patient does not wish to contact the pharmacy document the reason why and proceed with request.) (Agent: If yes, when and what did the pharmacy advise?)  This is the patient's preferred pharmacy:  [id number] [place], [place] - [address] [place] [address] Phone: [phone] Fax: [phone]   Is this the correct pharmacy for this prescription? Yes If no, delete pharmacy and type the correct one.   Has the prescription been filled recently? No  Is the patient out of the medication? No(few pills left)  Has the patient been seen for an appointment in the last year OR does the patient have an upcoming appointment? Yes  Can we respond through MyChart? Yes  Agent: Please be advised that Rx refills may take up to 3 business days. We ask that you follow-up with your pharmacy.

## 2024-04-20 ENCOUNTER — Other Ambulatory Visit (HOSPITAL_COMMUNITY): Payer: Self-pay | Admitting: Adult Health

## 2024-04-21 ENCOUNTER — Ambulatory Visit: Payer: Self-pay

## 2024-04-21 NOTE — Telephone Encounter (Signed)
  FYI Only or Action Required?: Action required by provider: update on patient condition.  Patient was last seen in primary care on 01/30/2024 by Kathy [name], Tully GRADE, MD.  Called Nurse Triage reporting Cough.  Symptoms began several months ago.  Interventions attempted: OTC medications: cough drops, zyrtec , starting delsym.  Symptoms are: gradually worsening.  Triage Disposition: See PCP When Office is Open (Within 3 Days)  Patient/caregiver understands and will follow disposition?: Yes Copied from CRM #[id number]. Topic: Clinical - Red Word Triage >> Apr 21, 2024  4:20 PM Paige D wrote: Red Word: Severe cough believes it due to allergies but cough has gotten worse throat is sore pt has watery eyes. Some times it feels like pt is choking on her phloem. Pt states she heart problems and is concerned all this coughing will mess it up more Reason for Disposition  Cough has been present for > 3 weeks  Answer Assessment - Initial Assessment Questions 1. ONSET: When did the cough begin?      On and off since spring, 2025, worsening 2. SEVERITY: How bad is the cough today?      worsening 3. SPUTUM: Describe the color of your sputum (e.g., none, dry cough; clear, white, yellow, green)     clear 4. HEMOPTYSIS: Are you coughing up any blood? If Yes, ask: How much? (e.g., flecks, streaks, tablespoons, etc.)     denies 5. DIFFICULTY BREATHING: Are you having difficulty breathing? If Yes, ask: How bad is it? (e.g., mild, moderate, severe)      Rarely, sometimes chokes on phlegm coming up 6. FEVER: Do you have a fever? If Yes, ask: What is your temperature, how was it measured, and when did it start?     denies 7. CARDIAC HISTORY: Do you have any history of heart disease? (e.g., heart attack, congestive heart failure)      Afib and CHF 8. LUNG HISTORY: Do you have any history of lung disease?  (e.g., pulmonary embolus, asthma, emphysema)     denies 9. PE RISK FACTORS:  Do you have a history of blood clots? (or: recent major surgery, recent prolonged travel, bedridden)     N/A 10. OTHER SYMPTOMS: Do you have any other symptoms? (e.g., runny nose, wheezing, chest pain)       Post nasal drip, sore throat 11. PREGNANCY: Is there any chance you are pregnant? When was your last menstrual period?       N/A 12. TRAVEL: Have you traveled out of the country in the last month? (e.g., travel history, exposures)       N/A  Protocols used: Cough - Acute Non-Productive-A-AH

## 2024-04-22 ENCOUNTER — Ambulatory Visit: Payer: Self-pay

## 2024-04-22 NOTE — Telephone Encounter (Signed)
 Left message on machine for patient to return our call

## 2024-04-22 NOTE — Telephone Encounter (Signed)
 Pt returning phone call from yesterday. Pt states that she has no concerns. Pt was triaged yesterday.   Copied from CRM #[id number]. Topic: General - Other >> Apr 22, 2024 11:03 AM Deaijah H wrote: Reason for RMF:Ejupzwu called in to return call to [facility]. Warm trans to NT.

## 2024-04-22 NOTE — Telephone Encounter (Signed)
 Spoke to the patient and offered and earlier appointment.  Patient declined.

## 2024-05-01 ENCOUNTER — Ambulatory Visit: Admitting: Internal Medicine

## 2024-05-01 ENCOUNTER — Encounter: Payer: Self-pay | Admitting: Internal Medicine

## 2024-05-01 VITALS — BP 150/70 | HR 51 | Temp 98.3°F | Wt 288.4 lb

## 2024-05-01 DIAGNOSIS — J302 Other seasonal allergic rhinitis: Secondary | ICD-10-CM

## 2024-05-01 NOTE — Progress Notes (Signed)
 Established Patient Office Visit     CC/Reason for Visit: Cough, nasal congestion  HPI: Kathy [name] is a 72 y.o. female who is coming in today for the above mentioned reasons.  Has been present for the last few weeks.  Usually has a lot of fall allergies with ragweed being her main allergen.  She does not feel like she has had a URI.  No muscle aches, no fever.   Past Medical/Surgical History: Past Medical History:  Diagnosis Date   Adenomatous colon polyp    2012   Anxiety and depression    Arthritis    Osteoarthritis, Dr Hiram   Cancer [facility])    skin - left foot excised   Chronic diastolic CHF (congestive heart failure) (HCC)    DDD (degenerative disc disease), cervical    Deaf, right    DVT (deep venous thrombosis) (HCC)    RLE DVT 08/2004, post right knee arthroscopy   Dysrhythmia    A.Fib   Fibromyalgia    Dr Ludie Piety, Rheu   GERD (gastroesophageal reflux disease)    H/O hiatal hernia    Headache(784.0)    PAST HX MILD MIGRAINES - resolved per patient 08/18/20   History of kidney stones    passed stones   HTN (hypertension)    IBS (irritable bowel syndrome)    Internal hemorrhoid    Lumbar radiculopathy    Obesity    OSA on CPAP    Moderate with AHI 20/hr with nocturnal hypoxemia on CPAP   PAF (paroxysmal atrial fibrillation) (HCC) 06/2018   Pulmonary hypertension (HCC)     Past Surgical History:  Procedure Laterality Date   ABDOMINAL HYSTERECTOMY     dysfunctional menses   APPENDECTOMY     BREAST EXCISIONAL BIOPSY Right    CARDIAC CATHETERIZATION  06/26/2018   CARDIOVERSION N/A 10/10/2022   Procedure: CARDIOVERSION;  Surgeon: Gardenia Led, DO;  Location: [facility];  Service: Cardiovascular;  Laterality: N/A;   CHOLECYSTECTOMY     COLONOSCOPY  05/27/2002, 08/08/11   2003 diverticulosis, hemorrhoids 2012 same + cecal polyp   EYE SURGERY     KNEE ARTHROSCOPY  2008   right   KYPHOPLASTY Bilateral 08/19/2020   Procedure:  KYPHOPLASTY LUMBAR ONE;  Surgeon: Debby Dorn MATSU, MD;  Location: [facility] OR;  Service: Neurosurgery;  Laterality: Bilateral;  posterior   KYPHOPLASTY N/A 07/31/2023   Procedure: KYPHOPLASTY LUMBAR FOUR;  Surgeon: Debby Dorn MATSU, MD;  Location: [facility] OR;  Service: Neurosurgery;  Laterality: N/A;   LEFT HEART CATH AND CORONARY ANGIOGRAPHY N/A 06/26/2018   Procedure: LEFT HEART CATH AND CORONARY ANGIOGRAPHY;  Surgeon: Darron Deatrice LABOR, MD;  Location: [facility] INVAS[facility] CV LAB;  Service: Cardiovascular;  Laterality: N/A;   MELANOMA EXCISION Left 07/2013   foot   RIGHT HEART CATH N/A 10/10/2022   Procedure: RIGHT HEART CATH;  Surgeon: Gardenia Led, DO;  Location: [facility]VAS[facility] CV LAB;  Service: Cardiovascular;  Laterality: N/A;   TEE WITHOUT CARDIOVERSION N/A 10/10/2022   Procedure: TRANSESOPHAGEAL ECHOCARDIOGRAM (TEE);  Surgeon: Gardenia Led, DO;  Location: [facility];  Service: Cardiovascular;  Laterality: N/A;   TOTAL HIP ARTHROPLASTY  02/2010   left ; Dr Hiram   TOTAL HIP ARTHROPLASTY  03/18/2012   Procedure: TOTAL HIP ARTHROPLASTY;  Surgeon: Dempsey LULLA Moan, MD;  Location: [facility] ORS;  Service: Orthopedics;  Laterality: Right;   UPPER GASTROINTESTINAL ENDOSCOPY  04/21/2009, 08/08/11   2010 gastritis ;2012 small hiatal hernia. Dr Avram   WRIST FRACTURE  SURGERY Left     Social History:  reports that she has never smoked. She has never used smokeless tobacco. She reports current alcohol use. She reports that she does not use drugs.  Allergies: Allergies  Allergen Reactions   Achromycin [Tetracycline Hcl] Shortness Of Breath    Dyspnea     Penicillins     Facial angioedema Did it involve swelling of the face/tongue/throat, SOB, or low BP? Yes Did it involve sudden or severe rash/hives, skin peeling, or any reaction on the inside of your mouth or nose? No Did you need to seek medical attention at a hospital or doctor's office? No When did it last happen?      30+ years If all above  answers are "NO", may proceed with cephalosporin use.    Sulfamethoxazole-Trimethoprim Hives   Nickel Itching   Oxycodone Itching   Zanaflex [Tizanidine Hydrochloride] Other (See Comments)    Causes more pain     Family History:  Family History  Problem Relation Age of Onset   Heart attack Mother 90   COPD Mother    Breast cancer Mother 54   Hypertension Father    Diabetes Father    Heart failure Father 9   Breast cancer Paternal Aunt        cns mets   COPD Maternal Grandmother    Heart disease Paternal Grandmother        died 57   Diabetes Paternal Grandmother    Stroke Paternal Grandfather        in late 71s   Heart attack Brother 5   Colon cancer Neg Hx      Current Outpatient Medications:    ALPRAZolam (XANAX) 1 MG tablet, Take 1 tablet (1 mg total) by mouth 3 (three) times daily as needed for anxiety., Disp: 90 tablet, Rfl: 0   amiodarone (PACERONE) 200 MG tablet, Take 1 tablet (200 mg total) by mouth daily., Disp: 90 tablet, Rfl: 3   atorvastatin (LIPITOR) 40 MG tablet, TAKE 1 TABLET BY MOUTH EVERY DAY, Disp: 90 tablet, Rfl: 0   benzonatate (TESSALON PERLES) 100 MG capsule, Take 1 capsule (100 mg total) by mouth 3 (three) times daily as needed., Disp: 30 capsule, Rfl: 0   Calcium Carb-Cholecalciferol (CALCIUM 600 + D PO), Take 600 mg by mouth daily., Disp: , Rfl:    cetirizine (ZYRTEC) 10 MG tablet, TAKE 1 TABLET BY MOUTH EVERY DAY, Disp: 90 tablet, Rfl: 1   clindamycin (CLEOCIN T) 1 % lotion, Apply 1 application  topically 2 (two) times daily as needed (itching/rash)., Disp: , Rfl:    clobetasol (TEMOVATE) 0.05 % external solution, Apply 1 application  topically daily as needed (Rosacea)., Disp: , Rfl:    cyclobenzaprine (FLEXERIL) 5 MG tablet, TAKE 1 TABLET BY MOUTH AT BEDTIME AS NEEDED FOR MUSCLE SPASMS., Disp: 30 tablet, Rfl: 0   ELIQUIS 5 MG TABS tablet, TAKE 1 TABLET BY MOUTH TWICE A DAY, Disp: 60 tablet, Rfl: 5   fluocinonide (LIDEX) 0.05 % external solution,  Apply 1 application topically daily as needed (rosacea)., Disp: , Rfl:    FLUoxetine (PROZAC) 40 MG capsule, TAKE 1 CAPSULE BY MOUTH EVERY DAY, Disp: 90 capsule, Rfl: 1   fluticasone (FLONASE) 50 MCG/ACT nasal spray, Place 2 sprays into both nostrils daily., Disp: 16 g, Rfl: 6   furosemide (LASIX) 40 MG tablet, TAKE 1 TABLET BY MOUTH EVERY DAY, Disp: 90 tablet, Rfl: 1   gabapentin (NEURONTIN) 300 MG capsule, TAKE 1 CAPSULE BY MOUTH  EVERYDAY AT BEDTIME, Disp: 90 capsule, Rfl: 1   methocarbamol (ROBAXIN) 750 MG tablet, Take 750 mg by mouth 3 (three) times daily as needed for muscle spasms., Disp: , Rfl:    metroNIDAZOLE (METROCREAM) 0.75 % cream, Apply 1 application topically 2 (two) times daily as needed (rosacea). , Disp: , Rfl:    Multiple Vitamin (MULTIVITAMIN WITH MINERALS) TABS tablet, Take 1 tablet by mouth daily. Centrum Silver, Disp: , Rfl:    nitroGLYCERIN (NITROSTAT) 0.4 MG SL tablet, Place 1 tablet (0.4 mg total) under the tongue every 5 (five) minutes as needed for chest pain., Disp: 90 tablet, Rfl: 3   omeprazole (PRILOSEC) 40 MG capsule, TAKE 1 CAPSULE BY MOUTH EVERY DAY, Disp: 90 capsule, Rfl: 1   potassium chloride (KLOR-CON) 10 MEQ tablet, Take 2 tablets (20 mEq total) by mouth daily., Disp: 180 tablet, Rfl: 3   Propylene Glycol (SYSTANE BALANCE) 0.6 % SOLN, Place 1 drop into both eyes daily as needed (Dry eyes)., Disp: , Rfl:    sacubitril-valsartan (ENTRESTO) 97-103 MG, Take 1 tablet by mouth 2 (two) times daily. PLEASE SCHEDULE AN APPOINTMENT FOR MORE REFILLS, Disp: 60 tablet, Rfl: 1  Review of Systems:  Negative unless indicated in HPI.   Physical Exam: Vitals:   05/01/24 1451  BP: (!) 150/70  Pulse: (!) 51  Temp: 98.3 F (36.8 C)  TempSrc: Oral  SpO2: 95%  Weight: 288 lb 6.4 oz (130.8 kg)    Body mass index is 45.85 kg/m.   Physical Exam Vitals reviewed.  Constitutional:      Appearance: Normal appearance. She is obese.  HENT:     Head: Normocephalic and  atraumatic.     Mouth/Throat:     Pharynx: Posterior oropharyngeal erythema present.  Eyes:     Conjunctiva/sclera: Conjunctivae normal.  Cardiovascular:     Rate and Rhythm: Normal rate and regular rhythm.  Pulmonary:     Effort: Pulmonary effort is normal.     Breath sounds: Normal breath sounds.  Skin:    General: Skin is warm and dry.  Neurological:     General: No focal deficit present.     Mental Status: She is alert and oriented to person, place, and time.  Psychiatric:        Mood and Affect: Mood normal.        Behavior: Behavior normal.        Thought Content: Thought content normal.        Judgment: Judgment normal.      Impression and Plan:  Seasonal allergies   - Advised use of daily antihistamine like Zyrtec with nasal steroid like Flonase on a daily basis.  Time spent:22 minutes reviewing chart, interviewing and examining patient and formulating plan of care.     Tully Theophilus Andrews, MD [facility]

## 2024-05-11 ENCOUNTER — Other Ambulatory Visit: Payer: Self-pay | Admitting: Internal Medicine

## 2024-05-11 DIAGNOSIS — M797 Fibromyalgia: Secondary | ICD-10-CM

## 2024-05-14 ENCOUNTER — Other Ambulatory Visit (HOSPITAL_COMMUNITY): Payer: Self-pay | Admitting: Cardiology

## 2024-05-14 DIAGNOSIS — E785 Hyperlipidemia, unspecified: Secondary | ICD-10-CM

## 2024-05-14 MED ORDER — ATORVASTATIN CALCIUM 40 MG PO TABS: 40.0000 mg | ORAL_TABLET | Freq: Every day | ORAL | 3 refills | Status: AC

## 2024-05-17 ENCOUNTER — Other Ambulatory Visit: Payer: Self-pay | Admitting: Internal Medicine

## 2024-05-17 DIAGNOSIS — J301 Allergic rhinitis due to pollen: Secondary | ICD-10-CM

## 2024-05-20 ENCOUNTER — Other Ambulatory Visit (HOSPITAL_COMMUNITY): Payer: Self-pay | Admitting: Cardiology

## 2024-05-20 ENCOUNTER — Other Ambulatory Visit: Payer: Self-pay | Admitting: Internal Medicine

## 2024-05-20 DIAGNOSIS — F419 Anxiety disorder, unspecified: Secondary | ICD-10-CM

## 2024-06-09 ENCOUNTER — Other Ambulatory Visit (HOSPITAL_COMMUNITY): Payer: Self-pay | Admitting: Cardiology

## 2024-06-11 ENCOUNTER — Telehealth (HOSPITAL_COMMUNITY): Payer: Self-pay | Admitting: Cardiology

## 2024-06-11 ENCOUNTER — Encounter (HOSPITAL_COMMUNITY): Payer: Self-pay | Admitting: Cardiology

## 2024-06-11 DIAGNOSIS — I5032 Chronic diastolic (congestive) heart failure: Secondary | ICD-10-CM

## 2024-06-11 NOTE — Telephone Encounter (Signed)
 Pt called to request a referral for GLP1/pharmacy  Reports it was discussed at multiple OV's and pt is finally ready to proceed    Ok to place referral

## 2024-06-12 ENCOUNTER — Encounter (INDEPENDENT_AMBULATORY_CARE_PROVIDER_SITE_OTHER): Payer: Self-pay | Admitting: Otolaryngology

## 2024-06-12 ENCOUNTER — Ambulatory Visit (INDEPENDENT_AMBULATORY_CARE_PROVIDER_SITE_OTHER): Admitting: Otolaryngology

## 2024-06-12 VITALS — BP 175/81 | HR 64

## 2024-06-12 DIAGNOSIS — R0982 Postnasal drip: Secondary | ICD-10-CM

## 2024-06-12 DIAGNOSIS — H90A21 Sensorineural hearing loss, unilateral, right ear, with restricted hearing on the contralateral side: Secondary | ICD-10-CM

## 2024-06-12 DIAGNOSIS — R058 Other specified cough: Secondary | ICD-10-CM

## 2024-06-12 DIAGNOSIS — R070 Pain in throat: Secondary | ICD-10-CM

## 2024-06-12 MED ORDER — AZELASTINE HCL 0.1 % NA SOLN: 2.0000 | Freq: Two times a day (BID) | NASAL | 12 refills | Status: DC

## 2024-06-12 NOTE — Patient Instructions (Addendum)
 Use flonase  two sprays each nostril twice per day, right after, use astelin  spray two sprays each nostril twice per day Use reflux gourmet - after lunch and dinner.  [facility] Med Nasal Saline Rinse  - start nasal saline rinses with NeilMed Bottle available over the counter    Nasal Saline Irrigation instructions: If you choose to make your own salt water solution, You will need: Salt (kosher, canning, or pickling salt) Baking soda Nasal irrigation bottle (i.e. Aureliano Med Sinus Rinse) Measuring spoon ( teaspoon) Distilled / boiled water   Mix solution Mix 1 teaspoon of salt, 1/2 teaspoon of baking soda and 1 cup of water into irrigation bottle ** May use saline packet instead of homemade recipe for this step if you prefer If medicine was prescribed to be mixed with solution, place this into bottle Examples 2 inches of 2% mupirocin ointment Budesonide solution Position your head: Lean over sink (about 45 degrees) Rotate head (about 45 degrees) so that one nostril is above the other Irrigate Insert tip of irrigation bottle into upper nostril so it forms a comfortable seal Irrigate while breathing through your mouth May remove the straw from the bottle in order to irrigate the entire solution (important if medicine was added) Exhale through nose when finished and blow nose as necessary  Repeat on opposite side with other 1/2 of solution (120 mL) or remake solution if all 240 mL was used on first side Wash irrigation bottle regularly, replace every 3 months

## 2024-06-12 NOTE — Progress Notes (Signed)
 Dear Dr. Theophilus Andrews, Here is my assessment for our mutual patient, Kathy [name]. Thank you for allowing me the opportunity to care for your patient. Please do not hesitate to contact me should you have any other questions. Sincerely, Dr. Eldora Blanch  Otolaryngology Clinic Note Referring provider: Dr. Theophilus Andrews HPI:  Kathy [name] is a 73 y.o. female kindly referred by Dr. Theophilus Andrews for evaluation of hearing loss.  Initial visit (10/2023): Patient reports: sudden hearing loss in right ear in 2016 after URI, saw Dr. Ethyl for it, and has significant hearing loss. No change in that ear. She last saw him in 2022, and noted to have normal hearing. She reports that over the past few months, she reports that she noted herself turning the TV up and her son has noted hearing is worse.  About 4-5 weeks ago, she was also diagnosed with a URI and had PND, cough, congestion; no fevers; given tessalon , flonase , and zyrtec  and she reports that she is doing better. She reports that she had some ear discomfort (intermittent) but has not happened over last few days -- left ear does feel like there is pressure. No discolored drainage, no facial pressure/pain currently. No frequent sinus infections.  Patient denies: vertigo, drainage, tinnitus Patient additionally denies: deep pain in ear canal, eustachian tube symptoms such as popping/crackling (currently, but had some recently), sensitive to pressure changes Patient also denies barotrauma, vestibular suppressant use, ototoxic medication use Prior ear surgery: no Does not get frequent ear infections  --------------------------------------------------------- 12/04/2023 Doing much better - no left sided symptoms after prednisone . Denies pain, fullness, vertigo, drainage. She did have a hearing test today.  --------------------------------------------------------- 06/12/2024 Seen for unrelated complaint today -- reports she has a lot of  fall allergies with throat clearing, soreness, rhinorrhea, PND, needing to blow her nose. No discolored drainage, no facial pain/pressure. No h/o frequent sinus infections. She has some discomfort over the left sided throat discomfort and dry cough. She is slowly improving, but does want to make sure she is not having any carcinoma given recent family events/concern for it. She has been on flonase  and zyrtec  with some improvement.  She does have GERD and is on PPI. No change in hearing.  H&N Surgery: no  GLP-1: no AP/AC: Eliquis   Tobacco: no.  Lives in [place], [place].  PMHx: A-fib, HTN, CHF, OA, CKD, Depression (grieving recent loss of her son), DVT, prior migraines  Independent Review of Additional Tests or Records:  Dr. Ethyl (11/25/2020): Left ear hearing loss, had sudden right SNHL in 2016; noted normal left ear hearing; no loss; audio: normal hearing left ear SRT 10dB; type A tymp; right ear severe SNHL with SRT 100dB Dr. Theophilus Andrews, MD [facility]) - 2025: noted left ear hearing loss; need ref to ENT Dr. Micheal (FM) 10/09/2023: noted two week history of cough, no red back sx, GERD on omeprazole ; Dx: Rhinitis and cough; Rx: flonase  and zyrtec  Dr. Theophilus Andrews (FM) 10/17/2023: noted 4 week history of head cold sx, PND, Cough, now left ear discomfort with popping sensation with congestion. Dx: Rhinitis;Rx: Xyrtec, Flonase  CT Head 11/32023 independently interpreted with respect to sinuses and ears: no significant sinonasal disease; cuts thick but mastoids and ME well aerated; no obvious otic capsule or ossicular chain abnormality MRI Brain 12/07/2014 independently interpreted with respect to ears - no mastoid effusion; no retrocochlear lesion noted (done after hearing loss)  Dr. Theophilus Andrews (05/01/2024): noted seasonal allergies, rec use of PO anthistamine and flonase ; usually with  lot of fall allergies  11/2023 Audiogram was independently reviewed and interpreted by me and it reveals  - essentially normal hearing thresholds left ear with profound SNHL right ear./ A/A tymps   SNHL= Sensorineural hearing loss  PMH/Meds/All/SocHx/FamHx/ROS:   Past Medical History:  Diagnosis Date   Adenomatous colon polyp    2012   Anxiety and depression    Arthritis    Osteoarthritis, Dr Hiram   Cancer [facility])    skin - left foot excised   Chronic diastolic CHF (congestive heart failure) (HCC)    DDD (degenerative disc disease), cervical    Deaf, right    DVT (deep venous thrombosis) (HCC)    RLE DVT 08/2004, post right knee arthroscopy   Dysrhythmia    A.Fib   Fibromyalgia    Dr Ludie Piety, Rheu   GERD (gastroesophageal reflux disease)    H/O hiatal hernia    Headache(784.0)    PAST HX MILD MIGRAINES - resolved per patient 08/18/20   History of kidney stones    passed stones   HTN (hypertension)    IBS (irritable bowel syndrome)    Internal hemorrhoid    Lumbar radiculopathy    Obesity    OSA on CPAP    Moderate with AHI 20/hr with nocturnal hypoxemia on CPAP   PAF (paroxysmal atrial fibrillation) (HCC) 06/2018   Pulmonary hypertension (HCC)      Past Surgical History:  Procedure Laterality Date   ABDOMINAL HYSTERECTOMY     dysfunctional menses   APPENDECTOMY     BREAST EXCISIONAL BIOPSY Right    CARDIAC CATHETERIZATION  06/26/2018   CARDIOVERSION N/A 10/10/2022   Procedure: CARDIOVERSION;  Surgeon: Gardenia Led, DO;  Location: [facility];  Service: Cardiovascular;  Laterality: N/A;   CHOLECYSTECTOMY     COLONOSCOPY  05/27/2002, 08/08/11   2003 diverticulosis, hemorrhoids 2012 same + cecal polyp   EYE SURGERY     KNEE ARTHROSCOPY  2008   right   KYPHOPLASTY Bilateral 08/19/2020   Procedure: KYPHOPLASTY LUMBAR ONE;  Surgeon: Debby Dorn MATSU, MD;  Location: [facility] OR;  Service: Neurosurgery;  Laterality: Bilateral;  posterior   KYPHOPLASTY N/A 07/31/2023   Procedure: KYPHOPLASTY LUMBAR FOUR;  Surgeon: Debby Dorn MATSU, MD;  Location[facility] OR;   Service: Neurosurgery;  Laterality: N/A;   LEFT HEART CATH AND CORONARY ANGIOGRAPHY N/A 06/26/2018   Procedure: LEFT HEART CATH AND CORONARY ANGIOGRAPHY;  Surgeon: Darron Deatrice LABOR, MD;  Location: [facility] INVASIVE CV LAB;  Service: Cardiovascular;  Laterality: N/A;   MELANOMA EXCISION Left 07/2013   foot   RIGHT HEART CATH N/A 10/10/2022   Procedure: RIGHT HEART CATH;  Surgeon: Gardenia Led, DO;  Location: [facility] INVASIVE CV LAB;  Service: Cardiovascular;  Laterality: N/A;   TEE WITHOUT CARDIOVERSION N/A 10/10/2022   Procedure: TRANSESOPHAGEAL ECHOCARDIOGRAM (TEE);  Surgeon: Gardenia Led, DO;  Location: [facility] ENDOS[facility];  Service: Cardiovascular;  Laterality: N/A;   TOTAL HIP ARTHROPLASTY  02/2010   left ; Dr Hiram   TOTAL HIP ARTHROPLASTY  03/18/2012   Procedure: TOTAL HIP ARTHROPLASTY;  Surgeon: Dempsey LULLA Moan, MD;  Location: [facility] ORS;  Service: Orthopedics;  Laterality: Right;   UPPER GASTROINTESTINAL ENDOSCOPY  04/21/2009, 08/08/11   2010 gastritis ;2012 small hiatal hernia. Dr Avram   WRIST FRACTURE SURGERY Left     Family History  Problem Relation Age of Onset   Heart attack Mother 38   COPD Mother    Breast cancer Mother 30   Hypertension Father    Diabetes  Father    Heart failure Father 31   Breast cancer Paternal Aunt        cns mets   COPD Maternal Grandmother    Heart disease Paternal Grandmother        died 45   Diabetes Paternal Grandmother    Stroke Paternal Grandfather        in late 62s   Heart attack Brother 67   Colon cancer Neg Hx      Social Connections: Moderately Integrated (11/20/2023)   Social Connection and Isolation Panel    Frequency of Communication with Friends and Family: More than three times a week    Frequency of Social Gatherings with Friends and Family: Three times a week    Attends Religious Services: More than 4 times per year    Active Member of Clubs or Organizations: Yes    Attends Banker Meetings: More than 4 times per  year    Marital Status: Widowed      Current Outpatient Medications:    azelastine  (ASTELIN ) 0.1 % nasal spray, Place 2 sprays into both nostrils 2 (two) times daily. Use in each nostril as directed, Disp: 30 mL, Rfl: 12   ALPRAZolam  (XANAX ) 1 MG tablet, TAKE 1 TABLET BY MOUTH 3 TIMES DAILY AS NEEDED FOR ANXIETY., Disp: 90 tablet, Rfl: 0   amiodarone  (PACERONE ) 200 MG tablet, Take 1 tablet (200 mg total) by mouth daily., Disp: 90 tablet, Rfl: 3   atorvastatin  (LIPITOR) 40 MG tablet, Take 1 tablet (40 mg total) by mouth daily., Disp: 90 tablet, Rfl: 3   benzonatate  (TESSALON  PERLES) 100 MG capsule, Take 1 capsule (100 mg total) by mouth 3 (three) times daily as needed., Disp: 30 capsule, Rfl: 0   Calcium  Carb-Cholecalciferol (CALCIUM  600 + D PO), Take 600 mg by mouth daily., Disp: , Rfl:    cetirizine  (ZYRTEC ) 10 MG tablet, TAKE 1 TABLET BY MOUTH EVERY DAY, Disp: 90 tablet, Rfl: 1   clindamycin (CLEOCIN T) 1 % lotion, Apply 1 application  topically 2 (two) times daily as needed (itching/rash)., Disp: , Rfl:    clobetasol  (TEMOVATE ) 0.05 % external solution, Apply 1 application  topically daily as needed (Rosacea)., Disp: , Rfl:    cyclobenzaprine  (FLEXERIL ) 5 MG tablet, TAKE 1 TABLET BY MOUTH AT BEDTIME AS NEEDED FOR MUSCLE SPASMS., Disp: 30 tablet, Rfl: 0   ELIQUIS  5 MG TABS tablet, TAKE 1 TABLET BY MOUTH TWICE A DAY, Disp: 60 tablet, Rfl: 5   ENTRESTO  97-103 MG, TAKE 1 TABLET BY MOUTH 2 (TWO) TIMES DAILY. PLEASE SCHEDULE AN APPOINTMENT FOR MORE REFILLS, Disp: 60 tablet, Rfl: 1   fluocinonide (LIDEX) 0.05 % external solution, Apply 1 application topically daily as needed (rosacea)., Disp: , Rfl:    FLUoxetine  (PROZAC ) 40 MG capsule, TAKE 1 CAPSULE BY MOUTH EVERY DAY, Disp: 90 capsule, Rfl: 1   fluticasone  (FLONASE ) 50 MCG/ACT nasal spray, SPRAY 2 SPRAYS INTO EACH NOSTRIL EVERY DAY, Disp: 48 mL, Rfl: 2   furosemide  (LASIX ) 40 MG tablet, TAKE 1 TABLET BY MOUTH EVERY DAY, Disp: 90 tablet, Rfl: 1    gabapentin  (NEURONTIN ) 300 MG capsule, TAKE 1 CAPSULE BY MOUTH EVERYDAY AT BEDTIME, Disp: 90 capsule, Rfl: 1   methocarbamol  (ROBAXIN ) 750 MG tablet, Take 750 mg by mouth 3 (three) times daily as needed for muscle spasms., Disp: , Rfl:    metroNIDAZOLE  (METROCREAM ) 0.75 % cream, Apply 1 application topically 2 (two) times daily as needed (rosacea). , Disp: , Rfl:  Multiple Vitamin (MULTIVITAMIN WITH MINERALS) TABS tablet, Take 1 tablet by mouth daily. Centrum Silver, Disp: , Rfl:    nitroGLYCERIN  (NITROSTAT ) 0.4 MG SL tablet, Place 1 tablet (0.4 mg total) under the tongue every 5 (five) minutes as needed for chest pain., Disp: 90 tablet, Rfl: 3   omeprazole  (PRILOSEC) 40 MG capsule, TAKE 1 CAPSULE BY MOUTH EVERY DAY, Disp: 90 capsule, Rfl: 1   potassium chloride  (KLOR-CON ) 10 MEQ tablet, Take 2 tablets (20 mEq total) by mouth daily. PLEASE SCHEDULE APPOINTMENT FOR MORE REFILLS, Disp: 180 tablet, Rfl: 3   Propylene Glycol (SYSTANE BALANCE) 0.6 % SOLN, Place 1 drop into both eyes daily as needed (Dry eyes)., Disp: , Rfl:    Physical Exam:   BP (!) 175/81   Pulse 64   Salient findings:  CN II-XII intact Noted Bilateral EAC clear and TM intact with well pneumatized middle ear spaces, mild global retraction b/l Anterior rhinoscopy: Septum relatively midline; bilateral inferior turbinates without significant hypertrophy; no purulence No respiratory distress or stridor; voice quality class 1.5; TFL was indicated to better evaluate the proximal airway, given the patient's history and exam findings, and is detailed below.  Seprately Identifiable Procedures:  Procedure Note Pre-procedure diagnosis: Sore throat, dry cough, post nasal drip Post-procedure diagnosis: Same Procedure: Transnasal Fiberoptic Laryngoscopy, CPT 31575 - Mod 25 Indication: see above Complications: None apparent EBL: 0 mL  The procedure was undertaken to further evaluate the patient's complaint above, with mirror exam  inadequate for appropriate examination due to gag reflex and poor patient tolerance  Procedure:  Patient was identified as correct patient. Verbal consent was obtained. The nose was sprayed with oxymetazoline and 4% lidocaine . The The flexible laryngoscope was passed through the nose to view the nasal cavity, pharynx (oropharynx, hypopharynx) and larynx.  The larynx was examined at rest and during multiple phonatory tasks. Documentation was obtained and reviewed with patient. The scope was removed. The patient tolerated the procedure well.  Findings: The nasal cavity and nasopharynx did not reveal any masses or lesions, mucosa appeared to be without obvious lesions. The tongue base, pharyngeal walls, piriform sinuses, vallecula, epiglottis and postcricoid region are normal in appearance. The visualized portion of the subglottis and proximal trachea is widely patent. The vocal folds are mobile bilaterally. There are no lesions on the free edge of the vocal folds nor elsewhere in the larynx worrisome for malignancy.    Electronically signed by: Eldora KATHEE Blanch, MD 06/15/2024 3:36 PM    Impression & Plans:  Kathy [name] is a 73 y.o. female with:  1. Throat discomfort   2. Post-nasal drip   3. Dry cough   4. 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 Slowly improving; No issues with ear. We discussed options and she opted for conservative course of management  -- start flonase  BID and astelin  BID; daily sinus rinse; can use Reflux gourmet after meals for any GERD sx  Right sudden loss since 2016 (she reports after URI) with no lesion noted on MRI at the time. Mild/borderline HL left ear. We prior discussed hearing options including CROS and CI. Wishes to defer currently - Stable ear wise currently   F/u 12 months with audio, sooner if any issues.. She  knows to call if left sided ear issues  See below regarding exact medications prescribed this encounter including dosages and route:  Meds ordered this encounter  Medications   azelastine  (ASTELIN ) 0.1 % nasal spray    Sig: Place 2 sprays into both nostrils 2 (two) times daily. Use in each nostril as directed    Dispense:  30 mL    Refill:  12      Thank you for allowing me the opportunity to care for your patient. Please do not hesitate to contact me should you have any other questions.  Sincerely, Eldora Blanch, MD Otolaryngologist (ENT), [facility] Phone: [phone] Fax: [phone]  06/15/2024, 3:36 PM   MDM:  Level 4: 99214 Complexity/Problems addressed: mod - new problem, chronic problem Data complexity: low - Morbidity: mod  - Drug prescribed or managed: y

## 2024-06-17 ENCOUNTER — Other Ambulatory Visit: Payer: Self-pay | Admitting: Internal Medicine

## 2024-06-17 DIAGNOSIS — F419 Anxiety disorder, unspecified: Secondary | ICD-10-CM

## 2024-06-17 NOTE — Telephone Encounter (Signed)
 Referral placed.

## 2024-06-23 ENCOUNTER — Other Ambulatory Visit (HOSPITAL_COMMUNITY): Payer: Self-pay | Admitting: Cardiology

## 2024-06-23 MED ORDER — APIXABAN 5 MG PO TABS: 5.0000 mg | ORAL_TABLET | Freq: Two times a day (BID) | ORAL | 5 refills | Status: AC

## 2024-07-15 ENCOUNTER — Telehealth: Payer: Self-pay

## 2024-07-15 ENCOUNTER — Ambulatory Visit
Admission: RE | Admit: 2024-07-15 | Discharge: 2024-07-15 | Disposition: A | Discharge status: Home / Self Care | Source: Ambulatory Visit | Attending: Internal Medicine | Admitting: Internal Medicine

## 2024-07-15 DIAGNOSIS — Z1231 Encounter for screening mammogram for malignant neoplasm of breast: Secondary | ICD-10-CM

## 2024-07-15 DIAGNOSIS — Z803 Family history of malignant neoplasm of breast: Secondary | ICD-10-CM

## 2024-07-15 NOTE — Telephone Encounter (Signed)
 Copied from CRM #[id number]. Topic: Clinical - Request for Lab/Test Order >> Jul 15, 2024  3:00 PM Larissa S wrote: Reason for CRM: Patient states she needs a [facility] to her family history.

## 2024-07-17 ENCOUNTER — Telehealth: Payer: Self-pay | Admitting: *Deleted

## 2024-07-17 DIAGNOSIS — Z803 Family history of malignant neoplasm of breast: Secondary | ICD-10-CM

## 2024-07-17 DIAGNOSIS — Z1231 Encounter for screening mammogram for malignant neoplasm of breast: Secondary | ICD-10-CM

## 2024-07-17 DIAGNOSIS — N644 Mastodynia: Secondary | ICD-10-CM

## 2024-07-17 DIAGNOSIS — N6321 Unspecified lump in the left breast, upper outer quadrant: Secondary | ICD-10-CM

## 2024-07-17 DIAGNOSIS — Q839 Congenital malformation of breast, unspecified: Secondary | ICD-10-CM

## 2024-07-17 NOTE — Telephone Encounter (Signed)
 Orders have been placed.

## 2024-07-17 NOTE — Addendum Note (Signed)
 Addended by: KATHRYNE MILLMAN B on: 07/17/2024 07:45 AM   Modules accepted: Orders

## 2024-07-17 NOTE — Telephone Encounter (Signed)
 Copied from CRM #[id number]. Topic: General - Other >> Jul 17, 2024  3:23 PM Victoria A wrote: Reason for CRM: [facility] with [facility] called said patient needs ultrasound and diagnostic mammagram orders please

## 2024-07-17 NOTE — Telephone Encounter (Signed)
 Form was also completed, faxed, and confirmed.

## 2024-07-17 NOTE — Telephone Encounter (Signed)
 Orders placed.

## 2024-07-17 NOTE — Telephone Encounter (Signed)
 Orders placed  Copied from CRM #[id number]. Topic: Clinical - Request for Lab/Test Order >> Jul 15, 2024  3:00 PM Larissa S wrote: Reason for CRM: Patient states she needs a new order for a diagnostic [facility] due to her family history. >> Jul 17, 2024  3:24 PM Paige D wrote: Pt is calling she needs further examination she needs a diagnostics mammogram with ultrasound. Please resend order with ultrasound as well

## 2024-07-18 ENCOUNTER — Telehealth: Payer: Self-pay | Admitting: *Deleted

## 2024-07-18 NOTE — Telephone Encounter (Signed)
 Copied from CRM [id number]. Topic: Clinical - Request for Lab/Test Order >> Jul 18, 2024  4:08 PM Kathy [name] wrote: Reason for CRM: Patient states she needs a new order for a diagnostic [facility] due to this most recent order sent not listing the symptoms of the patient. New diagnostic mammogram order needs to be sent with symptoms: a little lump with soreness on right breast, ultrasound identified it was nothing of concern but the spot remained sore; right nipple has been itching really bad but it is pretty persistent. Left breast- spot just above nipple, sore only when patient touches it, history of breast cancer with mom.   [phone] (M)

## 2024-07-20 ENCOUNTER — Other Ambulatory Visit: Payer: Self-pay | Admitting: Internal Medicine

## 2024-07-20 DIAGNOSIS — F419 Anxiety disorder, unspecified: Secondary | ICD-10-CM

## 2024-07-23 NOTE — Telephone Encounter (Signed)
 Spoke to the patient and she has a small lump near her arm pit on the left breast.  She has itching of the right nipple with some breast soreness.  Orders placed

## 2024-07-23 NOTE — Addendum Note (Signed)
 Addended by: KATHRYNE MILLMAN B on: 07/23/2024 01:20 PM   Modules accepted: Orders

## 2024-07-29 ENCOUNTER — Ambulatory Visit
Admission: RE | Admit: 2024-07-29 | Discharge: 2024-07-29 | Disposition: A | Source: Ambulatory Visit | Attending: Internal Medicine | Admitting: Internal Medicine

## 2024-07-29 ENCOUNTER — Ambulatory Visit
Admission: RE | Admit: 2024-07-29 | Discharge: 2024-07-29 | Disposition: A | Discharge status: Home / Self Care | Source: Ambulatory Visit | Attending: Internal Medicine | Admitting: Internal Medicine

## 2024-07-29 DIAGNOSIS — Q839 Congenital malformation of breast, unspecified: Secondary | ICD-10-CM

## 2024-07-29 DIAGNOSIS — N6321 Unspecified lump in the left breast, upper outer quadrant: Secondary | ICD-10-CM

## 2024-07-29 DIAGNOSIS — N644 Mastodynia: Secondary | ICD-10-CM

## 2024-07-30 ENCOUNTER — Ambulatory Visit: Payer: Self-pay

## 2024-07-30 NOTE — Telephone Encounter (Signed)
 Appointment scheduled.

## 2024-07-30 NOTE — Telephone Encounter (Signed)
 FYI Only or Action Required?: FYI only for provider: appointment scheduled on 11.13.25.  Patient was last seen in primary care on 05/01/2024 by Theophilus Andrews, Tully GRADE, MD.  Called Nurse Triage reporting Cough.  Symptoms began several days ago.  Interventions attempted: OTC medications: zyrtec  and Prescription medications: flonast.  Symptoms are: gradually worsening.  Triage Disposition: See PCP When Office is Open (Within 3 Days)  Patient/caregiver understands and will follow disposition?: Yes

## 2024-07-30 NOTE — Telephone Encounter (Signed)
 Reason for Triage: The patient's sinus have bene giving her issues. She is having sinus drainage, head congestion, and constant coughing that causes vomiting. She would like to know if there is something that can be prescribed to help with her symptoms or any over the counter recommendations.  Reason for Disposition  [1] Sinus congestion (pressure, fullness) AND [2] present > 10 days  Answer Assessment - Initial Assessment Questions Patient states that she has been having ongoing sinus issues for months. When she saw Dr. Theophilus back in August she was prescribed flonase  and still take takes zyrtec . She states she is coughing from the drainage.. But that on Saturday she noticed worsening drainage, sinus pressure and cough worsened. The phlegm makes her gag at times.     1. LOCATION: Where does it hurt?      Sinus and throat 2. ONSET: When did the sinus pain start?  (e.g., hours, days)      Saturday night  3. SEVERITY: How bad is the pain?   (Scale 0-10; or none, mild, moderate or severe)     5-6 4. RECURRENT SYMPTOM: Have you ever had sinus problems before? If Yes, ask: When was the last time? and What happened that time?      yes 5. NASAL CONGESTION: Is the nose blocked? If Yes, ask: Can you open it or must you breathe through your mouth?     yes 6. NASAL DISCHARGE: Do you have discharge from your nose? If so ask, What color?     clear 7. FEVER: Do you have a fever? If Yes, ask: What is it, how was it measured, and when did it start?      no 8. OTHER SYMPTOMS: Do you have any other symptoms? (e.g., sore throat, cough, earache, difficulty breathing)     Sore throat, coughing so hard times gags and liquid comes up, headache, dizziness  Protocols used: Sinus Pain or Congestion-A-AH

## 2024-07-31 ENCOUNTER — Ambulatory Visit (INDEPENDENT_AMBULATORY_CARE_PROVIDER_SITE_OTHER)

## 2024-07-31 ENCOUNTER — Ambulatory Visit: Admitting: Internal Medicine

## 2024-07-31 ENCOUNTER — Encounter: Payer: Self-pay | Admitting: Internal Medicine

## 2024-07-31 VITALS — BP 124/84 | Temp 98.0°F | Wt 279.1 lb

## 2024-07-31 DIAGNOSIS — J189 Pneumonia, unspecified organism: Secondary | ICD-10-CM | POA: Diagnosis not present

## 2024-07-31 MED ORDER — AMOXICILLIN-POT CLAVULANATE 875-125 MG PO TABS: 1.0000 | ORAL_TABLET | Freq: Two times a day (BID) | ORAL | 0 refills | Status: DC

## 2024-07-31 MED ORDER — DOXYCYCLINE HYCLATE 100 MG PO TABS: 100.0000 mg | ORAL_TABLET | Freq: Two times a day (BID) | ORAL | 0 refills | Status: AC

## 2024-07-31 MED ORDER — HYDROCOD POLI-CHLORPHE POLI ER 10-8 MG/5ML PO SUER: 5.0000 mL | Freq: Every evening | ORAL | 0 refills | Status: DC | PRN

## 2024-07-31 NOTE — Progress Notes (Signed)
 Established Patient Office Visit     CC/Reason for Visit: Cough, shortness of breath  HPI: Kathy [name] is a 73 y.o. female who is coming in today for the above mentioned reasons.  Has been having URI symptoms for close to a month at this point.  Has visited with ENT and is being treated with Flonase  and antihistamines.  Over the weekend she developed a progression of symptoms including significant cough productive of copious amounts of clear sputum.  Low-grade temperature and mild shortness of breath.  No sick contacts or recent travel.   Past Medical/Surgical History: Past Medical History:  Diagnosis Date   Adenomatous colon polyp    2012   Anxiety and depression    Arthritis    Osteoarthritis, Dr Hiram   [facility])    skin - left foot excised   Chronic diastolic CHF (congestive heart failure) (HCC)    DDD (degenerative disc disease), cervical    Deaf, right    DVT (deep venous thrombosis) (HCC)    RLE DVT 08/2004, post right knee arthroscopy   Dysrhythmia    A.Fib   Fibromyalgia    Dr Ludie Piety, Rheu   GERD (gastroesophageal reflux disease)    H/O hiatal hernia    Headache(784.0)    PAST HX MILD MIGRAINES - resolved per patient 08/18/20   History of kidney stones    passed stones   HTN (hypertension)    IBS (irritable bowel syndrome)    Internal hemorrhoid    Lumbar radiculopathy    Obesity    OSA on CPAP    Moderate with AHI 20/hr with nocturnal hypoxemia on CPAP   PAF (paroxysmal atrial fibrillation) (HCC) 06/2018   Pulmonary hypertension (HCC)     Past Surgical History:  Procedure Laterality Date   ABDOMINAL HYSTERECTOMY     dysfunctional menses   APPENDECTOMY     BREAST EXCISIONAL BIOPSY Right    CARDIAC CATHETERIZATION  06/26/2018   CARDIOVERSION N/A 10/10/2022   Procedure: CARDIOVERSION;  Surgeon: Gardenia Led, DO;  [facility];  Service: Cardiovascular;  Laterality: N/A;   CHOLECYSTECTOMY     COLONOSCOPY  05/27/2002,  08/08/11   2003 diverticulosis, hemorrhoids 2012 same + cecal polyp   EYE SURGERY     KNEE ARTHROSCOPY  2008   right   KYPHOPLASTY Bilateral 08/19/2020   Procedure: KYPHOPLASTY LUMBAR ONE;  Surgeon: Debby Dorn MATSU, MD;  Location: [facility] OR;  Service: Neurosurgery;  Laterality: Bilateral;  posterior   KYPHOPLASTY N/A 07/31/2023   Procedure: KYPHOPLASTY LUMBAR FOUR;  Surgeon: Debby Dorn MATSU, MD;  Location: [facility] OR;  Service: Neurosurgery;  Laterality: N/A;   LEFT HEART CATH AND CORONARY ANGIOGRAPHY N/A 06/26/2018   Procedure: LEFT HEART CATH AND CORONARY ANGIOGRAPHY;  Surgeon: Darron Deatrice LABOR, MD;  [facility];  Service: Cardiovascular;  Laterality: N/A;   MELANOMA EXCISION Left 07/2013   foot   RIGHT HEART CATH N/A 10/10/2022   Procedure: RIGHT HEART CATH;  Surgeon: Gardenia Led, DO;  [facility];  Service: Cardiovascular;  Laterality: N/A;   TEE WITHOUT CARDIOVERSION N/A 10/10/2022   Procedure: TRANSESOPHAGEAL ECHOCARDIOGRAM (TEE);  Surgeon: Gardenia Led, DO;  [facility];  Service: Cardiovascular;  Laterality: N/A;   TOTAL HIP ARTHROPLASTY  02/2010   left ; Dr Hiram   TOTAL HIP ARTHROPLASTY  03/18/2012   Procedure: TOTAL HIP ARTHROPLASTY;  Surgeon: Dempsey LULLA Moan, MD;  Location: [facility];  Service: Orthopedics;  Laterality:  Right;   UPPER GASTROINTESTINAL ENDOSCOPY  04/21/2009, 08/08/11   2010 gastritis ;2012 small hiatal hernia. Dr Avram   WRIST FRACTURE SURGERY Left     Social History:  reports that she has never smoked. She has never used smokeless tobacco. She reports current alcohol  use. She reports that she does not use drugs.  Allergies: Allergies  Allergen Reactions   Achromycin [Tetracycline Hcl] Shortness Of Breath    Dyspnea     Penicillins     Facial angioedema Did it involve swelling of the face/tongue/throat, SOB, or low BP? Yes Did it involve sudden or severe rash/hives, skin peeling, or any reaction on  the inside of your mouth or nose? No Did you need to seek medical attention at a hospital or doctor's office? No When did it last happen?      30+ years If all above answers are "NO", may proceed with cephalosporin use.    Sulfamethoxazole-Trimethoprim Hives   Nickel Itching   Oxycodone  Itching   Zanaflex [Tizanidine Hydrochloride] Other (See Comments)    Causes more pain     Family History:  Family History  Problem Relation Age of Onset   Heart attack Mother 57   COPD Mother    Breast cancer Mother 6   Hypertension Father    Diabetes Father    Heart failure Father 75   Breast cancer Paternal Aunt        cns mets   COPD Maternal Grandmother    Heart disease Paternal Grandmother        died 22   Diabetes Paternal Grandmother    Stroke Paternal Grandfather        in late 48s   Heart attack Brother 54   Colon cancer Neg Hx      Current Outpatient Medications:    ALPRAZolam  (XANAX ) 1 MG tablet, TAKE 1 TABLET BY MOUTH THREE TIMES A DAY AS NEEDED FOR ANXIETY, Disp: 90 tablet, Rfl: 0   amiodarone  (PACERONE ) 200 MG tablet, Take 1 tablet (200 mg total) by mouth daily., Disp: 90 tablet, Rfl: 3   apixaban  (ELIQUIS ) 5 MG TABS tablet, Take 1 tablet (5 mg total) by mouth 2 (two) times daily., Disp: 60 tablet, Rfl: 5   atorvastatin  (LIPITOR) 40 MG tablet, Take 1 tablet (40 mg total) by mouth daily., Disp: 90 tablet, Rfl: 3   azelastine  (ASTELIN ) 0.1 % nasal spray, Place 2 sprays into both nostrils 2 (two) times daily. Use in each nostril as directed, Disp: 30 mL, Rfl: 12   Calcium  Carb-Cholecalciferol (CALCIUM  600 + D PO), Take 600 mg by mouth daily., Disp: , Rfl:    cetirizine  (ZYRTEC ) 10 MG tablet, TAKE 1 TABLET BY MOUTH EVERY DAY, Disp: 90 tablet, Rfl: 1   chlorpheniramine-HYDROcodone  (TUSSIONEX) 10-8 MG/5ML, Take 5 mLs by mouth at bedtime as needed., Disp: 70 mL, Rfl: 0   clindamycin (CLEOCIN T) 1 % lotion, Apply 1 application  topically 2 (two) times daily as needed (itching/rash).,  Disp: , Rfl:    clobetasol  (TEMOVATE ) 0.05 % external solution, Apply 1 application  topically daily as needed (Rosacea)., Disp: , Rfl:    cyclobenzaprine  (FLEXERIL ) 5 MG tablet, TAKE 1 TABLET BY MOUTH AT BEDTIME AS NEEDED FOR MUSCLE SPASMS., Disp: 30 tablet, Rfl: 0   doxycycline  (VIBRA -TABS) 100 MG tablet, Take 1 tablet (100 mg total) by mouth 2 (two) times daily for 7 days., Disp: 14 tablet, Rfl: 0   ENTRESTO  97-103 MG, TAKE 1 TABLET BY MOUTH 2 (TWO) TIMES DAILY.  PLEASE SCHEDULE AN APPOINTMENT FOR MORE REFILLS, Disp: 60 tablet, Rfl: 1   fluocinonide (LIDEX) 0.05 % external solution, Apply 1 application topically daily as needed (rosacea)., Disp: , Rfl:    FLUoxetine  (PROZAC ) 40 MG capsule, TAKE 1 CAPSULE BY MOUTH EVERY DAY, Disp: 90 capsule, Rfl: 1   fluticasone  (FLONASE ) 50 MCG/ACT nasal spray, SPRAY 2 SPRAYS INTO EACH NOSTRIL EVERY DAY, Disp: 48 mL, Rfl: 2   furosemide  (LASIX ) 40 MG tablet, TAKE 1 TABLET BY MOUTH EVERY DAY, Disp: 90 tablet, Rfl: 1   gabapentin  (NEURONTIN ) 300 MG capsule, TAKE 1 CAPSULE BY MOUTH EVERYDAY AT BEDTIME, Disp: 90 capsule, Rfl: 1   methocarbamol  (ROBAXIN ) 750 MG tablet, Take 750 mg by mouth 3 (three) times daily as needed for muscle spasms., Disp: , Rfl:    metroNIDAZOLE  (METROCREAM ) 0.75 % cream, Apply 1 application topically 2 (two) times daily as needed (rosacea). , Disp: , Rfl:    Multiple Vitamin (MULTIVITAMIN WITH MINERALS) TABS tablet, Take 1 tablet by mouth daily. Centrum Silver, Disp: , Rfl:    nitroGLYCERIN  (NITROSTAT ) 0.4 MG SL tablet, Place 1 tablet (0.4 mg total) under the tongue every 5 (five) minutes as needed for chest pain., Disp: 90 tablet, Rfl: 3   omeprazole  (PRILOSEC) 40 MG capsule, TAKE 1 CAPSULE BY MOUTH EVERY DAY, Disp: 90 capsule, Rfl: 1   potassium chloride  (KLOR-CON ) 10 MEQ tablet, Take 2 tablets (20 mEq total) by mouth daily. PLEASE SCHEDULE APPOINTMENT FOR MORE REFILLS, Disp: 180 tablet, Rfl: 3   Propylene Glycol (SYSTANE BALANCE) 0.6 %  SOLN, Place 1 drop into both eyes daily as needed (Dry eyes)., Disp: , Rfl:    benzonatate  (TESSALON  PERLES) 100 MG capsule, Take 1 capsule (100 mg total) by mouth 3 (three) times daily as needed., Disp: 30 capsule, Rfl: 0  Review of Systems:  Negative unless indicated in HPI.   Physical Exam: Vitals:   07/31/24 1449  BP: 124/84  Temp: 98 F (36.7 C)  TempSrc: Oral  Weight: 279 lb 1.6 oz (126.6 kg)    Body mass index is 44.37 kg/m.   Physical Exam Vitals reviewed.  Constitutional:      Appearance: Normal appearance.  HENT:     Mouth/Throat:     Mouth: Mucous membranes are moist.     Pharynx: Posterior oropharyngeal erythema present.  Eyes:     Conjunctiva/sclera: Conjunctivae normal.  Cardiovascular:     Rate and Rhythm: Normal rate and regular rhythm.  Pulmonary:     Effort: Pulmonary effort is normal.     Breath sounds: Examination of the right-middle field reveals rales. Examination of the right-lower field reveals rales. Rales present.  Neurological:     Mental Status: She is alert.      Impression and Plan:  Community acquired pneumonia of right lower lobe of lung -     DG Chest 2 View; Future -     Doxycycline  Hyclate; Take 1 tablet (100 mg total) by mouth 2 (two) times daily for 7 days.  Dispense: 14 tablet; Refill: 0 -     Hydrocod Poli-Chlorphe Poli ER; Take 5 mLs by mouth at bedtime as needed.  Dispense: 70 mL; Refill: 0   - Based on lung exam and symptoms suspect community-acquired pneumonia.  Send for chest x-ray, send cough syrup given significant coughing.  She has multiple antibiotic allergies.  States she has tolerated doxycycline  in the past despite a listed allergy of shortness of breath.  She has angioedema with penicillins and hives  with sulfa.  Time spent:31 minutes reviewing chart, interviewing and examining patient and formulating plan of care.     Tully Theophilus Andrews, MD [facility]

## 2024-08-04 ENCOUNTER — Ambulatory Visit: Admitting: Pharmacist Clinician (PhC)/ Clinical Pharmacy Specialist

## 2024-08-04 ENCOUNTER — Ambulatory Visit: Payer: Self-pay | Admitting: Internal Medicine

## 2024-08-05 ENCOUNTER — Encounter: Payer: Self-pay | Admitting: Internal Medicine

## 2024-08-05 ENCOUNTER — Ambulatory Visit: Admitting: Internal Medicine

## 2024-08-05 VITALS — BP 124/78 | HR 53 | Temp 97.7°F | Wt 274.5 lb

## 2024-08-05 DIAGNOSIS — J189 Pneumonia, unspecified organism: Secondary | ICD-10-CM | POA: Diagnosis not present

## 2024-08-05 NOTE — Progress Notes (Signed)
 Established Patient Office Visit     CC/Reason for Visit: Follow-up cough and pneumonia  HPI: Kathy [name] is a 73 y.o. female who is coming in today for the above mentioned reasons.  Was seen last week with significant coughing and diagnosed with acquired pneumonia placed on a course of doxycycline .  She has 2 days remaining of her antibiotic course and is feeling significantly proved.  A little weak and tired.   Past Medical/Surgical History: Past Medical History:  Diagnosis Date   Adenomatous colon polyp    2012   Anxiety and depression    Arthritis    Osteoarthritis, Dr Hiram   Cancer [facility])    skin - left foot excised   Chronic diastolic CHF (congestive heart failure) (HCC)    DDD (degenerative disc disease), cervical    Deaf, right    DVT (deep venous thrombosis) (HCC)    RLE DVT 08/2004, post right knee arthroscopy   Dysrhythmia    A.Fib   Fibromyalgia    Dr Ludie Piety, Rheu   GERD (gastroesophageal reflux disease)    H/O hiatal hernia    Headache(784.0)    PAST HX MILD MIGRAINES - resolved per patient 08/18/20   History of kidney stones    passed stones   HTN (hypertension)    IBS (irritable bowel syndrome)    Internal hemorrhoid    Lumbar radiculopathy    Obesity    OSA on CPAP    Moderate with AHI 20/hr with nocturnal hypoxemia on CPAP   PAF (paroxysmal atrial fibrillation) (HCC) 06/2018   Pulmonary hypertension (HCC)     Past Surgical History:  Procedure Laterality Date   ABDOMINAL HYSTERECTOMY     dysfunctional menses   APPENDECTOMY     BREAST EXCISIONAL BIOPSY Right    CARDIAC CATHETERIZATION  06/26/2018   CARDIOVERSION N/A 10/10/2022   Procedure: CARDIOVERSION;  Surgeon: Gardenia Led, DO;  [facility];  Service: Cardiovascular;  Laterality: N/A;   CHOLECYSTECTOMY     COLONOSCOPY  05/27/2002, 08/08/11   2003 diverticulosis, hemorrhoids 2012 same + cecal polyp   EYE SURGERY     KNEE ARTHROSCOPY  2008   right    KYPHOPLASTY Bilateral 08/19/2020   Procedure: KYPHOPLASTY LUMBAR ONE;  Surgeon: Debby Dorn MATSU, MD;  Location: [facility] OR;  Service: Neurosurgery;  Laterality: Bilateral;  posterior   KYPHOPLASTY N/A 07/31/2023   Procedure: KYPHOPLASTY LUMBAR FOUR;  Surgeon: Debby Dorn MATSU, MD;  Location: [facility] OR;  Service: Neurosurgery;  Laterality: N/A;   LEFT HEART CATH AND CORONARY ANGIOGRAPHY N/A 06/26/2018   Procedure: LEFT HEART CATH AND CORONARY ANGIOGRAPHY;  Surgeon: Darron Deatrice LABOR, MD;  [facility];  Service: Cardiovascular;  Laterality: N/A;   MELANOMA EXCISION Left 07/2013   foot   RIGHT HEART CATH N/A 10/10/2022   Procedure: RIGHT HEART CATH;  Surgeon: Gardenia Led, DO;  [facility];  Service: Cardiovascular;  Laterality: N/A;   TEE WITHOUT CARDIOVERSION N/A 10/10/2022   Procedure: TRANSESOPHAGEAL ECHOCARDIOGRAM (TEE);  Surgeon: Gardenia Led, DO;  [facility];  Service: Cardiovascular;  Laterality: N/A;   TOTAL HIP ARTHROPLASTY  02/2010   left ; Dr Hiram   TOTAL HIP ARTHROPLASTY  03/18/2012   Procedure: TOTAL HIP ARTHROPLASTY;  Surgeon: Dempsey LULLA Moan, MD;  Location: [facility];  Service: Orthopedics;  Laterality: Right;   UPPER GASTROINTESTINAL ENDOSCOPY  04/21/2009, 08/08/11   2010 gastritis ;2012 small hiatal hernia. Dr Avram   WRIST  FRACTURE SURGERY Left     Social History:  reports that she has never smoked. She has never used smokeless tobacco. She reports current alcohol  use. She reports that she does not use drugs.  Allergies: Allergies  Allergen Reactions   Achromycin [Tetracycline Hcl] Shortness Of Breath    Dyspnea     Penicillins     Facial angioedema Did it involve swelling of the face/tongue/throat, SOB, or low BP? Yes Did it involve sudden or severe rash/hives, skin peeling, or any reaction on the inside of your mouth or nose? No Did you need to seek medical attention at a hospital or doctor's office? No When did  it last happen?      30+ years If all above answers are "NO", may proceed with cephalosporin use.    Sulfamethoxazole-Trimethoprim Hives   Nickel Itching   Oxycodone  Itching   Zanaflex [Tizanidine Hydrochloride] Other (See Comments)    Causes more pain     Family History:  Family History  Problem Relation Age of Onset   Heart attack Mother 20   COPD Mother    Breast cancer Mother 29   Hypertension Father    Diabetes Father    Heart failure Father 80   Breast cancer Paternal Aunt        cns mets   COPD Maternal Grandmother    Heart disease Paternal Grandmother        died 59   Diabetes Paternal Grandmother    Stroke Paternal Grandfather        in late 59s   Heart attack Brother 19   Colon cancer Neg Hx      Current Outpatient Medications:    ALPRAZolam  (XANAX ) 1 MG tablet, TAKE 1 TABLET BY MOUTH THREE TIMES A DAY AS NEEDED FOR ANXIETY, Disp: 90 tablet, Rfl: 0   amiodarone  (PACERONE ) 200 MG tablet, Take 1 tablet (200 mg total) by mouth daily., Disp: 90 tablet, Rfl: 3   apixaban  (ELIQUIS ) 5 MG TABS tablet, Take 1 tablet (5 mg total) by mouth 2 (two) times daily., Disp: 60 tablet, Rfl: 5   atorvastatin  (LIPITOR) 40 MG tablet, Take 1 tablet (40 mg total) by mouth daily., Disp: 90 tablet, Rfl: 3   azelastine  (ASTELIN ) 0.1 % nasal spray, Place 2 sprays into both nostrils 2 (two) times daily. Use in each nostril as directed, Disp: 30 mL, Rfl: 12   benzonatate  (TESSALON  PERLES) 100 MG capsule, Take 1 capsule (100 mg total) by mouth 3 (three) times daily as needed., Disp: 30 capsule, Rfl: 0   Calcium  Carb-Cholecalciferol (CALCIUM  600 + D PO), Take 600 mg by mouth daily., Disp: , Rfl:    cetirizine  (ZYRTEC ) 10 MG tablet, TAKE 1 TABLET BY MOUTH EVERY DAY, Disp: 90 tablet, Rfl: 1   chlorpheniramine-HYDROcodone  (TUSSIONEX) 10-8 MG/5ML, Take 5 mLs by mouth at bedtime as needed., Disp: 70 mL, Rfl: 0   clindamycin (CLEOCIN T) 1 % lotion, Apply 1 application  topically 2 (two) times daily as  needed (itching/rash)., Disp: , Rfl:    clobetasol  (TEMOVATE ) 0.05 % external solution, Apply 1 application  topically daily as needed (Rosacea)., Disp: , Rfl:    cyclobenzaprine  (FLEXERIL ) 5 MG tablet, TAKE 1 TABLET BY MOUTH AT BEDTIME AS NEEDED FOR MUSCLE SPASMS., Disp: 30 tablet, Rfl: 0   doxycycline  (VIBRA -TABS) 100 MG tablet, Take 1 tablet (100 mg total) by mouth 2 (two) times daily for 7 days., Disp: 14 tablet, Rfl: 0   ENTRESTO  97-103 MG, TAKE 1 TABLET BY  MOUTH 2 (TWO) TIMES DAILY. PLEASE SCHEDULE AN APPOINTMENT FOR MORE REFILLS, Disp: 60 tablet, Rfl: 1   fluocinonide (LIDEX) 0.05 % external solution, Apply 1 application topically daily as needed (rosacea)., Disp: , Rfl:    FLUoxetine  (PROZAC ) 40 MG capsule, TAKE 1 CAPSULE BY MOUTH EVERY DAY, Disp: 90 capsule, Rfl: 1   fluticasone  (FLONASE ) 50 MCG/ACT nasal spray, SPRAY 2 SPRAYS INTO EACH NOSTRIL EVERY DAY, Disp: 48 mL, Rfl: 2   furosemide  (LASIX ) 40 MG tablet, TAKE 1 TABLET BY MOUTH EVERY DAY, Disp: 90 tablet, Rfl: 1   gabapentin  (NEURONTIN ) 300 MG capsule, TAKE 1 CAPSULE BY MOUTH EVERYDAY AT BEDTIME, Disp: 90 capsule, Rfl: 1   methocarbamol  (ROBAXIN ) 750 MG tablet, Take 750 mg by mouth 3 (three) times daily as needed for muscle spasms., Disp: , Rfl:    metroNIDAZOLE  (METROCREAM ) 0.75 % cream, Apply 1 application topically 2 (two) times daily as needed (rosacea). , Disp: , Rfl:    Multiple Vitamin (MULTIVITAMIN WITH MINERALS) TABS tablet, Take 1 tablet by mouth daily. Centrum Silver, Disp: , Rfl:    nitroGLYCERIN  (NITROSTAT ) 0.4 MG SL tablet, Place 1 tablet (0.4 mg total) under the tongue every 5 (five) minutes as needed for chest pain., Disp: 90 tablet, Rfl: 3   omeprazole  (PRILOSEC) 40 MG capsule, TAKE 1 CAPSULE BY MOUTH EVERY DAY, Disp: 90 capsule, Rfl: 1   potassium chloride  (KLOR-CON ) 10 MEQ tablet, Take 2 tablets (20 mEq total) by mouth daily. PLEASE SCHEDULE APPOINTMENT FOR MORE REFILLS, Disp: 180 tablet, Rfl: 3   Propylene Glycol  (SYSTANE BALANCE) 0.6 % SOLN, Place 1 drop into both eyes daily as needed (Dry eyes)., Disp: , Rfl:   Review of Systems:  Negative unless indicated in HPI.   Physical Exam: Vitals:   08/05/24 1428 08/05/24 1435  BP: (!) 152/64 124/78  Pulse: (!) 53   Temp: 97.7 F (36.5 C)   TempSrc: Oral   SpO2: 97%   Weight: 274 lb 8 oz (124.5 kg)     Body mass index is 43.64 kg/m.   Physical Exam Pulmonary:     Effort: Pulmonary effort is normal.     Breath sounds: Normal breath sounds.      Impression and Plan:  Community acquired pneumonia, unspecified laterality  -Clinically improved, not hypoxic, continue out course of antibiotics.   Time spent:22 minutes reviewing chart, interviewing and examining patient and formulating plan of care.     Tully Theophilus Andrews, MD [facility]

## 2024-08-07 ENCOUNTER — Ambulatory Visit (INDEPENDENT_AMBULATORY_CARE_PROVIDER_SITE_OTHER): Admitting: Otolaryngology

## 2024-08-12 ENCOUNTER — Ambulatory Visit (HOSPITAL_COMMUNITY)
Admission: RE | Admit: 2024-08-12 | Discharge: 2024-08-12 | Disposition: A | Discharge status: Home / Self Care | Source: Ambulatory Visit | Attending: Family Medicine | Admitting: Family Medicine

## 2024-08-12 ENCOUNTER — Encounter (HOSPITAL_COMMUNITY): Payer: Self-pay

## 2024-08-12 ENCOUNTER — Telehealth (HOSPITAL_COMMUNITY): Payer: Self-pay

## 2024-08-12 ENCOUNTER — Other Ambulatory Visit (HOSPITAL_COMMUNITY): Payer: Self-pay

## 2024-08-12 VITALS — BP 128/62 | HR 56 | Ht 66.5 in | Wt 273.4 lb

## 2024-08-12 DIAGNOSIS — Z79899 Other long term (current) drug therapy: Secondary | ICD-10-CM | POA: Diagnosis not present

## 2024-08-12 DIAGNOSIS — G4733 Obstructive sleep apnea (adult) (pediatric): Secondary | ICD-10-CM | POA: Diagnosis not present

## 2024-08-12 DIAGNOSIS — Z6841 Body Mass Index (BMI) 40.0 and over, adult: Secondary | ICD-10-CM | POA: Insufficient documentation

## 2024-08-12 DIAGNOSIS — E669 Obesity, unspecified: Secondary | ICD-10-CM | POA: Insufficient documentation

## 2024-08-12 DIAGNOSIS — I110 Hypertensive heart disease with heart failure: Secondary | ICD-10-CM | POA: Insufficient documentation

## 2024-08-12 DIAGNOSIS — E785 Hyperlipidemia, unspecified: Secondary | ICD-10-CM | POA: Insufficient documentation

## 2024-08-12 DIAGNOSIS — I2510 Atherosclerotic heart disease of native coronary artery without angina pectoris: Secondary | ICD-10-CM | POA: Diagnosis not present

## 2024-08-12 DIAGNOSIS — I5032 Chronic diastolic (congestive) heart failure: Secondary | ICD-10-CM

## 2024-08-12 DIAGNOSIS — I071 Rheumatic tricuspid insufficiency: Secondary | ICD-10-CM | POA: Insufficient documentation

## 2024-08-12 DIAGNOSIS — Z7984 Long term (current) use of oral hypoglycemic drugs: Secondary | ICD-10-CM | POA: Insufficient documentation

## 2024-08-12 DIAGNOSIS — I480 Paroxysmal atrial fibrillation: Secondary | ICD-10-CM

## 2024-08-12 DIAGNOSIS — I50810 Right heart failure, unspecified: Secondary | ICD-10-CM

## 2024-08-12 LAB — COMPREHENSIVE METABOLIC PANEL WITH GFR
ALT: 89 U/L — ABNORMAL HIGH (ref 0–44)
AST: 80 U/L — ABNORMAL HIGH (ref 15–41)
Albumin: 4 g/dL (ref 3.5–5.0)
Alkaline Phosphatase: 77 U/L (ref 38–126)
Anion gap: 6 (ref 5–15)
BUN: 71 mg/dL — ABNORMAL HIGH (ref 8–23)
CO2: 25 mmol/L (ref 22–32)
Calcium: 9.5 mg/dL (ref 8.9–10.3)
Chloride: 96 mmol/L — ABNORMAL LOW (ref 98–111)
Creatinine, Ser: 1.8 mg/dL — ABNORMAL HIGH (ref 0.44–1.00)
GFR, Estimated: 30 mL/min — ABNORMAL LOW (ref >60)
Glucose, Bld: 106 mg/dL — ABNORMAL HIGH (ref 70–99)
Potassium: 4.9 mmol/L (ref 3.5–5.1)
Sodium: 127 mmol/L — ABNORMAL LOW (ref 135–145)
Total Bilirubin: 0.5 mg/dL (ref 0.0–1.2)
Total Protein: 6.4 g/dL — ABNORMAL LOW (ref 6.5–8.1)

## 2024-08-12 LAB — CBC
HCT: 37.6 % (ref 36.0–46.0)
Hemoglobin: 12 g/dL (ref 12.0–15.0)
MCH: 26.8 pg (ref 26.0–34.0)
MCHC: 31.9 g/dL (ref 30.0–36.0)
MCV: 84.1 fL (ref 80.0–100.0)
Platelets: 198 K/uL (ref 150–400)
RBC: 4.47 MIL/uL (ref 3.87–5.11)
RDW: 15.9 % — ABNORMAL HIGH (ref 11.5–15.5)
WBC: 4.9 K/uL (ref 4.0–10.5)
nRBC: 0 % (ref 0.0–0.2)

## 2024-08-12 LAB — BRAIN NATRIURETIC PEPTIDE: B Natriuretic Peptide: 78.7 pg/mL (ref 0.0–100.0)

## 2024-08-12 LAB — TSH: TSH: 0.598 u[IU]/mL (ref 0.350–4.500)

## 2024-08-12 LAB — T4, FREE: Free T4: 1.12 ng/dL (ref 0.61–1.12)

## 2024-08-12 MED ORDER — EMPAGLIFLOZIN 10 MG PO TABS: 10.0000 mg | ORAL_TABLET | Freq: Every day | ORAL | 3 refills | Status: DC

## 2024-08-12 MED ORDER — PERFLUTREN LIPID MICROSPHERE
1.0000 mL | INTRAVENOUS | Status: DC | PRN
  Administered 2024-08-12: 3 mL via INTRAVENOUS

## 2024-08-12 MED ORDER — FUROSEMIDE 40 MG PO TABS: 40.0000 mg | ORAL_TABLET | ORAL | 1 refills | Status: AC | PRN

## 2024-08-12 MED ORDER — POTASSIUM CHLORIDE ER 10 MEQ PO TBCR: 20.0000 meq | EXTENDED_RELEASE_TABLET | ORAL | 3 refills | Status: AC | PRN

## 2024-08-12 NOTE — Patient Instructions (Addendum)
 Thank you for coming in today  If you had labs drawn today, any labs that are abnormal the clinic will call you No news is good news  Medications: START Jardiance  10 mg 1 tablet daily  Change Lasix  to 40 mg as needed For weight gain of 3 lbs in 24 hours or 5 lbs in a week. Also change Potassium 20 meq to take when taking a Lasix  dose.  Follow up appointments:  Your physician recommends that you schedule a follow-up appointment in:  6 months with Dr. Zenaida  Please call our office to schedule the follow-up appointment in March 2026 for May 2026    Do the following things EVERYDAY: Weigh yourself in the morning before breakfast. Write it down and keep it in a log. Take your medicines as prescribed Eat low salt foods--Limit salt (sodium) to 2000 mg per day.  Stay as active as you can everyday Limit all fluids for the day to less than 2 liters   At the [facility], you and your health needs are our priority. As part of our continuing mission to provide you with exceptional heart care, we have created designated Provider Care Teams. These Care Teams include your primary Cardiologist (physician) and Advanced Practice Providers (APPs- Physician Assistants and Nurse Practitioners) who all work together to provide you with the care you need, when you need it.   You may see any of the following providers on your designated Care Team at your next follow up: Dr Toribio Fuel Dr Ezra Shuck Dr. Ria Gardenia Greig Lenetta, NP Caffie Shed, [place] [facility] [place], [place] Beckey Coe, NP Tinnie Redman, PharmD   Please be sure to bring in all your medications bottles to every appointment.    Thank you for choosing  [facility]  If you have any questions or concerns before your next appointment please send us  a message through [facility] or call our office at [phone].    TO LEAVE A MESSAGE FOR THE NURSE SELECT  OPTION 2, PLEASE LEAVE A MESSAGE INCLUDING: YOUR NAME DATE OF BIRTH CALL BACK NUMBER REASON FOR CALL**this is important as we prioritize the call backs  YOU WILL RECEIVE A CALL BACK THE SAME DAY AS LONG AS YOU CALL BEFORE 4:00 PM

## 2024-08-12 NOTE — Telephone Encounter (Signed)
 [facility] Patient Advocate Encounter  Test billing for this patient's current coverage ([employer] M[employer]) returns a $0 copay for 90 day supply of Farxiga  or Jardiance.  This test claim was processed through [facility]- copay amounts may vary at other pharmacies due to pharmacy/plan contracts, or as the patient moves through the different stages of their insurance plan.  Rachel DEL, CPhT Rx Patient Advocate Phone: [phone]

## 2024-08-12 NOTE — Progress Notes (Signed)
 ADVANCED HEART FAILURE CLINIC NOTE  Primary Care: Theophilus Andrews, Tully GRADE, MD Primary Cardiologist: Dr. Mona  HF Cardiologist: assign to Dr. Zenaida  HPI: Kathy [name] is a 73 y.o. female with HFpEF, paroxysmal atrial fibrillation, OSA (CPAP), hyperlipidemia and HTN.  Kathy [name] cardiac history dates back to 2019 when she was admitted with AF, echo showed preserved LV function and severe LVH. She converted to NSR with metoprolol . She underwent LHC w/ nonobstructive CAD.   Admitted 11/23 with acute on chronic HFpEF; echo at that time with dilated RV w/ moderately reduced systolic function. She was diuresed and discharged home.  Kathy [name] has undergone RHC confirming RV dysfunction with reduced cardiac output/index likely secondary to long term OSA and severe TR. In addition, she has had afib for a prolonged period; underwent TEE/DCCV with conversion to sinus rhythm. cMRI in April 2024 demonstrated preserved LV function, severely dilated RV with RVEF 58%, D-shaped interventricular septum suggesting degree of RV pressure/volume overload, moderate to severe TR.  Today she returns for HF follow up. Overall feeling fine. Recovering from recent URI. She is not SOB walking on flat ground, walks with a cane for balance. Lives alone. She has occasional dizziness, no falls or syncope. Feels rare atypical hurt and stinging non exertional and resolves spontaneously. She has not had to use nitroglycerin . Denies palpitations, abnormal bleeding, edema, or PND/Orthopnea. Appetite ok. Weight at home 269 pounds, lost 20 lbs recently. Taking all medications. Wears CPAP.  Echo today 08/12/24, images reviewed with Dr. Zenaida, EF stable 60-65%, normal RV, no obvious TR   Current Outpatient Medications  Medication Sig Dispense Refill   ALPRAZolam  (XANAX ) 1 MG tablet TAKE 1 TABLET BY MOUTH THREE TIMES A DAY AS NEEDED FOR ANXIETY 90 tablet 0   amiodarone  (PACERONE ) 200 MG tablet Take 1 tablet (200  mg total) by mouth daily. 90 tablet 3   apixaban  (ELIQUIS ) 5 MG TABS tablet Take 1 tablet (5 mg total) by mouth 2 (two) times daily. 60 tablet 5   atorvastatin  (LIPITOR) 40 MG tablet Take 1 tablet (40 mg total) by mouth daily. 90 tablet 3   azelastine  (ASTELIN ) 0.1 % nasal spray Place 2 sprays into both nostrils 2 (two) times daily. Use in each nostril as directed 30 mL 12   Calcium  Carb-Cholecalciferol (CALCIUM  600 + D PO) Take 600 mg by mouth daily.     cetirizine  (ZYRTEC ) 10 MG tablet TAKE 1 TABLET BY MOUTH EVERY DAY 90 tablet 1   chlorpheniramine-HYDROcodone  (TUSSIONEX) 10-8 MG/5ML Take 5 mLs by mouth at bedtime as needed. 70 mL 0   cyclobenzaprine  (FLEXERIL ) 5 MG tablet TAKE 1 TABLET BY MOUTH AT BEDTIME AS NEEDED FOR MUSCLE SPASMS. 30 tablet 0   ENTRESTO  97-103 MG TAKE 1 TABLET BY MOUTH 2 (TWO) TIMES DAILY. PLEASE SCHEDULE AN APPOINTMENT FOR MORE REFILLS 60 tablet 1   fluocinonide (LIDEX) 0.05 % external solution Apply 1 application topically daily as needed (rosacea).     FLUoxetine  (PROZAC ) 40 MG capsule TAKE 1 CAPSULE BY MOUTH EVERY DAY 90 capsule 1   fluticasone  (FLONASE ) 50 MCG/ACT nasal spray SPRAY 2 SPRAYS INTO EACH NOSTRIL EVERY DAY 48 mL 2   furosemide  (LASIX ) 40 MG tablet TAKE 1 TABLET BY MOUTH EVERY DAY 90 tablet 1   gabapentin  (NEURONTIN ) 300 MG capsule TAKE 1 CAPSULE BY MOUTH EVERYDAY AT BEDTIME 90 capsule 1   methocarbamol  (ROBAXIN ) 750 MG tablet Take 750 mg by mouth 3 (three) times daily as needed for muscle spasms.  metroNIDAZOLE  (METROCREAM ) 0.75 % cream Apply 1 application topically 2 (two) times daily as needed (rosacea).      Multiple Vitamin (MULTIVITAMIN WITH MINERALS) TABS tablet Take 1 tablet by mouth daily. Centrum Silver     nitroGLYCERIN  (NITROSTAT ) 0.4 MG SL tablet Place 1 tablet (0.4 mg total) under the tongue every 5 (five) minutes as needed for chest pain. 90 tablet 3   omeprazole  (PRILOSEC) 40 MG capsule TAKE 1 CAPSULE BY MOUTH EVERY DAY 90 capsule 1    potassium chloride  (KLOR-CON ) 10 MEQ tablet Take 2 tablets (20 mEq total) by mouth daily. PLEASE SCHEDULE APPOINTMENT FOR MORE REFILLS 180 tablet 3   Propylene Glycol (SYSTANE BALANCE) 0.6 % SOLN Place 1 drop into both eyes daily as needed (Dry eyes).     No current facility-administered medications for this encounter.   Facility-Administered Medications Ordered in Other Encounters  Medication Dose Route Frequency Provider Last Rate Last Admin   perflutren  lipid microspheres (DEFINITY ) IV suspension  1-10 mL Intravenous PRN Glena Harlene HERO, FNP   3 mL at 08/12/24 1429   Wt Readings from Last 3 Encounters:  08/12/24 124 kg (273 lb 6.4 oz)  08/05/24 124.5 kg (274 lb 8 oz)  07/31/24 126.6 kg (279 lb 1.6 oz)   BP 128/62   Pulse (!) 56   Ht 5' 6.5 (1.689 m)   Wt 124 kg (273 lb 6.4 oz)   SpO2 96%   BMI 43.47 kg/m   PHYSICAL EXAM: General:  NAD. No resp difficulty, walked into clinic with cane HEENT: Normal Neck: Supple. No JVD. Cor: Regular rate & rhythm. No rubs, gallops or murmurs. Lungs: Clear Abdomen: Soft, obese, nontender, nondistended.  Extremities: No cyanosis, clubbing, rash, edema Neuro: Alert & oriented x 3, moves all 4 extremities w/o difficulty. Affect pleasant.  DATA REVIEW  ECG:  08/12/24: SB 56 bpm  cMRI: 12/25/22: LVEF 69%, RVEF 58%, RV severely dilated, D-shaped IVS suggestive of RV pressure/volume overload, nonspecific LGE at basal to mid inferoseptal RV insertion site, moderate to severe TR with regurgitant fraction of 36% and regurgitant volume of 65 cc  ECHO: 07/27/22: LVEF 50-55%, RV is dilated with mild to moderately reduced systolic function. RA is severely dilated. There is severe eccentric TR.    CATH: 06/26/18:  Ost RCA lesion is 30% stenosed. The left ventricular systolic function is normal. LV end diastolic pressure is mildly elevated. The left ventricular ejection fraction is 55-65% by visual estimate.  10/10/22: RA:                  13 mmHg  (mean) RV:                  41/10-15 mmHg PA:                  40/15 mmHg (21 mean) PCWP:            14 mmHg (mean)                                      Estimated Fick CO/CI   4.39 L/min, 1.9 L/min/m2 Thermodilution CO/CI   3.61 L/min, 1.5 L/min/m2                                            TPG  7  mmHg                                              PVR                 1.6-1.94 Wood Units  PAPi                1.9       IMPRESSION: Severely elevated pre-capillary pressures.  Normal left sided filling pressures Normal PVR and PA mean.  Severely reduced cardiac index likely secondary to suboptimal RV function. (RCE:ERTE of 1, PAPi of 1.9. )   ASSESSMENT & PLAN:  Chronic RV Failure/Severe TR - Echocardiograms dating back to 2019 demonstrate severe TR. On her initial echo in 2019, she has severe TR with mildly dilated RV and preserved LV function. Over the past 3 years, echo's demonstrate gradual progression of TR and worsening of RV function.  Uncertain if this is primary TR, or a sequale of underlying pulmonary hypertension.  - RHC/TEE 1/24 confirmed RV failure with severe TR. Likely has had prolonged atrial fibrillation and OSA.  - Cardiac MRI 04/24 with RVEF 58%, severely dilated RV and evidence of moderate to severe TR. - Initially noted significant improvement in functional status with cardioversion from AF to NSR.  - Echo today, no obvious TR and normal RV systolic function, RVSP 19 mmhg. - Euvolemic on exam   Heart failure w/ preserved EF  - Echo today 08/12/24, images reviewed with Dr. Zenaida, EF stable 60-65% - NYHA II symptoms, volume stable today - Start Jardiance  10 mg daily, discussed potential SE - Change Lasix  40 mg to PRN, change 20 KCL to PRN - Continue Entresto  97/103 mg bid - Labs today  Atrial fibrillation  - s/p TEE/DCCV in January 2024. - SB on ECG today. - Continue amiodarone  200 mg daily. - Continue Eliquis  5 mg bid. No bleeding issues. -  Check CBC, LFTs and TSH today  OSA  - AHI 20/hr with O2 sats as low as 79%.  - Wears CPAP  Obesity - Body mass index is 43.47 kg/m. - Consider GLP1. Insurance is changing at first of the  year  Follow up in 6 months with Dr. Zenaida Harlene Gainer, FNP-BC 08/12/24

## 2024-08-13 ENCOUNTER — Other Ambulatory Visit (HOSPITAL_COMMUNITY): Payer: Self-pay

## 2024-08-13 LAB — ECHOCARDIOGRAM COMPLETE
Area-P 1/2: 3.13 cm2
S' Lateral: 3.4 cm

## 2024-08-13 MED ORDER — ENTRESTO 97-103 MG PO TABS: 1.0000 | ORAL_TABLET | Freq: Two times a day (BID) | ORAL | 1 refills | Status: DC

## 2024-08-15 ENCOUNTER — Ambulatory Visit (HOSPITAL_COMMUNITY): Payer: Self-pay | Admitting: Family Medicine

## 2024-08-18 ENCOUNTER — Telehealth (HOSPITAL_COMMUNITY): Payer: Self-pay | Admitting: *Deleted

## 2024-08-18 ENCOUNTER — Encounter (HOSPITAL_COMMUNITY): Payer: Self-pay

## 2024-08-18 DIAGNOSIS — I5032 Chronic diastolic (congestive) heart failure: Secondary | ICD-10-CM

## 2024-08-18 MED ORDER — AMIODARONE HCL 100 MG PO TABS: 100.0000 mg | ORAL_TABLET | Freq: Every day | ORAL | 3 refills | Status: AC

## 2024-08-18 NOTE — Telephone Encounter (Signed)
 Called patient per Harlene Gainer, NP with following lab results and instructions:  LFTs and creatinine are elevated.    TSH stable.   Decrease amiodarone  to 100 mg daily. Repeat CMET in 2 weeks, Please let her know her echo is stable, EG 60-65%, RV normal and no TR.  Pt verbalized understanding of same. Updated Rx sent. Lab ordered and scheduled.

## 2024-08-19 ENCOUNTER — Other Ambulatory Visit: Payer: Self-pay | Admitting: Internal Medicine

## 2024-08-19 DIAGNOSIS — F419 Anxiety disorder, unspecified: Secondary | ICD-10-CM

## 2024-08-26 ENCOUNTER — Telehealth (HOSPITAL_COMMUNITY): Payer: Self-pay | Admitting: Cardiology

## 2024-08-26 MED ORDER — DAPAGLIFLOZIN PROPANEDIOL 10 MG PO TABS: 10.0000 mg | ORAL_TABLET | Freq: Every day | ORAL | 11 refills | Status: AC

## 2024-08-26 NOTE — Telephone Encounter (Signed)
 Can trial off jardiance  for a couple of days would then have her start farxiga  10 mg daily to see if that is better tolerated

## 2024-08-26 NOTE — Telephone Encounter (Signed)
 Pt aware.

## 2024-08-26 NOTE — Telephone Encounter (Signed)
 Patient called to report nausea, HA and not feeling well since starting jardiance  One episode of flutter this AM   Wanted to report symptoms as advised at last office visit  Pt would like to know if she should continue medication or change to something different

## 2024-09-01 ENCOUNTER — Ambulatory Visit: Payer: Self-pay | Admitting: *Deleted

## 2024-09-01 NOTE — Telephone Encounter (Signed)
 FYI Only or Action Required?: FYI only for provider: appointment scheduled on 09/02/24.  Patient was last seen in primary care on 08/05/2024 by Theophilus Andrews, Tully GRADE, MD.  Called Nurse Triage reporting Cough. Increased and yellow sputum. Chest tightness with coughing   Symptoms began several weeks ago. Worsening last 3-4 days   Interventions attempted: Prescription medications: cough medication and prescription from 08/05/24 OV .  Symptoms are: gradually worsening.  Triage Disposition: See PCP When Office is Open (Within 3 Days) 1-3 days   Patient/caregiver understands and will follow disposition?: Yes   Patient agreed to schedule appt for tomorrow but requesting if PCP recommended additional medication instead please notify patient via my chart or call. Patient reports she doesn't feel bad , sx just aggravating. Please advise                 Copied from CRM [id number]. Topic: Clinical - Red Word Triage >> Sep 01, 2024  2:39 PM Amy B wrote: Red Word that prompted transfer to Nurse Triage: Chest tightness with yellow productive cough Reason for Disposition  Cough has been present for > 3 weeks  Answer Assessment - Initial Assessment Questions Due to increased cough and yellow mucus, appt scheduled for tomorrow with PCP. Patient requesting if additional medication needed or if she needs to keep appt tomorrow. Please advise via phone call or my chart message        1. ONSET: When did the cough begin?      On going for approx few weeks  2. SEVERITY: How bad is the cough today?      No worse 3. SPUTUM: Describe the color of your sputum (e.g., none, dry cough; clear, white, yellow, green)     Yellow  4. HEMOPTYSIS: Are you coughing up any blood? If Yes, ask: How much? (e.g., flecks, streaks, tablespoons, etc.)     Na  5. DIFFICULTY BREATHING: Are you having difficulty breathing? If Yes, ask: How bad is it? (e.g., mild, moderate, severe)      No  6.  FEVER: Do you have a fever? If Yes, ask: What is your temperature, how was it measured, and when did it start?     Na  7. CARDIAC HISTORY: Do you have any history of heart disease? (e.g., heart attack, congestive heart failure)      Na  8. LUNG HISTORY: Do you have any history of lung disease?  (e.g., pulmonary embolus, asthma, emphysema)     See hx  9. PE RISK FACTORS: Do you have a history of blood clots? (or: recent major surgery, recent prolonged travel, bedridden)     Na  10. OTHER SYMPTOMS: Do you have any other symptoms? (e.g., runny nose, wheezing, chest pain)       Runny nose clear mucus, cough increased/continues but now yellow mucus noted. Chest tightness upper chest after coughing episodes. Reports dont feel bad but sx aggravating.  11. PREGNANCY: Is there any chance you are pregnant? When was your last menstrual period?       na 12. TRAVEL: Have you traveled out of the country in the last month? (e.g., travel history, exposures)       na  Protocols used: Cough - Acute Productive-A-AH

## 2024-09-02 ENCOUNTER — Ambulatory Visit (HOSPITAL_COMMUNITY): Admission: RE | Admit: 2024-09-02 | Discharge: 2024-09-02 | Attending: Cardiology

## 2024-09-02 ENCOUNTER — Encounter: Payer: Self-pay | Admitting: Internal Medicine

## 2024-09-02 ENCOUNTER — Ambulatory Visit: Admitting: Internal Medicine

## 2024-09-02 ENCOUNTER — Ambulatory Visit (HOSPITAL_COMMUNITY): Payer: Self-pay | Admitting: Family Medicine

## 2024-09-02 VITALS — BP 130/70 | HR 60 | Temp 98.3°F | Wt 277.4 lb

## 2024-09-02 DIAGNOSIS — I5032 Chronic diastolic (congestive) heart failure: Secondary | ICD-10-CM

## 2024-09-02 DIAGNOSIS — J069 Acute upper respiratory infection, unspecified: Secondary | ICD-10-CM

## 2024-09-02 DIAGNOSIS — J189 Pneumonia, unspecified organism: Secondary | ICD-10-CM | POA: Diagnosis not present

## 2024-09-02 LAB — COMPREHENSIVE METABOLIC PANEL WITH GFR
ALT: 47 U/L — ABNORMAL HIGH (ref 0–44)
AST: 50 U/L — ABNORMAL HIGH (ref 15–41)
Albumin: 4.2 g/dL (ref 3.5–5.0)
Alkaline Phosphatase: 92 U/L (ref 38–126)
Anion gap: 9 (ref 5–15)
BUN: 40 mg/dL — ABNORMAL HIGH (ref 8–23)
CO2: 28 mmol/L (ref 22–32)
Calcium: 9.3 mg/dL (ref 8.9–10.3)
Chloride: 96 mmol/L — ABNORMAL LOW (ref 98–111)
Creatinine, Ser: 1.43 mg/dL — ABNORMAL HIGH (ref 0.44–1.00)
GFR, Estimated: 39 mL/min — ABNORMAL LOW (ref >60)
Glucose, Bld: 96 mg/dL (ref 70–99)
Potassium: 4.6 mmol/L (ref 3.5–5.1)
Sodium: 133 mmol/L — ABNORMAL LOW (ref 135–145)
Total Bilirubin: 0.4 mg/dL (ref 0.0–1.2)
Total Protein: 6.8 g/dL (ref 6.5–8.1)

## 2024-09-02 MED ORDER — NITROGLYCERIN 0.4 MG SL SUBL: 0.4000 mg | SUBLINGUAL_TABLET | SUBLINGUAL | 3 refills | Status: AC | PRN

## 2024-09-02 MED ORDER — HYDROCOD POLI-CHLORPHE POLI ER 10-8 MG/5ML PO SUER: 5.0000 mL | Freq: Every evening | ORAL | 0 refills | Status: DC | PRN

## 2024-09-02 NOTE — Progress Notes (Signed)
 Established Patient Office Visit     CC/Reason for Visit: Cough  HPI: Kathy [name] is a 73 y.o. female who is coming in today for the above mentioned reasons.  She was treated for pneumonia in early November.  Recovered well after antibiotics.  Has had a recurrent cough with runny nose, nasal congestion, postnasal drip.  She feels like this is a new cough.   Past Medical/Surgical History: Past Medical History:  Diagnosis Date   Adenomatous colon polyp    2012   Anxiety and depression    Arthritis    Osteoarthritis, Dr Hiram   Cancer [facility])    skin - left foot excised   Chronic diastolic CHF (congestive heart failure) (HCC)    DDD (degenerative disc disease), cervical    Deaf, right    DVT (deep venous thrombosis) (HCC)    RLE DVT 08/2004, post right knee arthroscopy   Dysrhythmia    A.Fib   Fibromyalgia    Dr Ludie Piety, Rheu   GERD (gastroesophageal reflux disease)    H/O hiatal hernia    Headache(784.0)    PAST HX MILD MIGRAINES - resolved per patient 08/18/20   History of kidney stones    passed stones   HTN (hypertension)    IBS (irritable bowel syndrome)    Internal hemorrhoid    Lumbar radiculopathy    Obesity    OSA on CPAP    Moderate with AHI 20/hr with nocturnal hypoxemia on CPAP   PAF (paroxysmal atrial fibrillation) (HCC) 06/2018   Pulmonary hypertension (HCC)     Past Surgical History:  Procedure Laterality Date   ABDOMINAL HYSTERECTOMY     dysfunctional menses   APPENDECTOMY     BREAST EXCISIONAL BIOPSY Right    CARDIAC CATHETERIZATION  06/26/2018   CARDIOVERSION N/A 10/10/2022   Procedure: CARDIOVERSION;  Surgeon: Gardenia Led, DO;  Location: [facility] ENDOSCOPY;  Service: Cardiovascular;  Laterality: N/A;   CHOLECYSTECTOMY     COLONOSCOPY  05/27/2002, 08/08/11   2003 diverticulosis, hemorrhoids 2012 same + cecal polyp   EYE SURGERY     KNEE ARTHROSCOPY  2008   right   KYPHOPLASTY Bilateral 08/19/2020   Procedure: KYPHOPLASTY  LUMBAR ONE;  Surgeon: Debby Dorn MATSU, MD;  Location: [facility] OR;  Service: Neurosurgery;  Laterality: Bilateral;  posterior   KYPHOPLASTY N/A 07/31/2023   Procedure: KYPHOPLASTY LUMBAR FOUR;  Surgeon: Debby Dorn MATSU, MD;  Location: [facility] OR;  Service: Neurosurgery;  Laterality: N/A;   LEFT HEART CATH AND CORONARY ANGIOGRAPHY N/A 06/26/2018   Procedure: LEFT HEART CATH AND CORONARY ANGIOGRAPHY;  Surgeon: Darron Deatrice LABOR, MD;  Location: [facility] INVASIVE CV LAB;  Service: Cardiovascular;  Laterality: N/A;   MELANOMA EXCISION Left 07/2013   foot   RIGHT HEART CATH N/A 10/10/2022   Procedure: RIGHT HEART CATH;  Surgeon: Gardenia Led, DO;  Location: [facility] INVASIVE CV LAB;  Service: Cardiovascular;  Laterality: N/A;   TEE WITHOUT CARDIOVERSION N/A 10/10/2022   Procedure: TRANSESOPHAGEAL ECHOCARDIOGRAM (TEE);  Surgeon: Gardenia Led, DO;  Location: [facility] ENDOSCOPY;  Service: Cardiovascular;  Laterality: N/A;   TOTAL HIP ARTHROPLASTY  02/2010   left ; Dr Hiram   TOTAL HIP ARTHROPLASTY  03/18/2012   Procedure: TOTAL HIP ARTHROPLASTY;  Surgeon: Dempsey LULLA Moan, MD;  Location: [facility] ORS;  Service: Orthopedics;  Laterality: Right;   UPPER GASTROINTESTINAL ENDOSCOPY  04/21/2009, 08/08/11   2010 gastritis ;2012 small hiatal hernia. Dr Avram   WRIST FRACTURE SURGERY Left  Social History:  reports that she has never smoked. She has never used smokeless tobacco. She reports current alcohol  use. She reports that she does not use drugs.  Allergies: Allergies[1]  Family History:  Family History  Problem Relation Age of Onset   Heart attack Mother 82   COPD Mother    Breast cancer Mother 58   Hypertension Father    Diabetes Father    Heart failure Father 39   Breast cancer Paternal Aunt        cns mets   COPD Maternal Grandmother    Heart disease Paternal Grandmother        died 20   Diabetes Paternal Grandmother    Stroke Paternal Grandfather        in late 28s   Heart attack Brother 68    Colon cancer Neg Hx     Current Medications[2]  Review of Systems:  Negative unless indicated in HPI.   Physical Exam: Vitals:   09/02/24 1551  BP: 130/70  Pulse: 60  Temp: 98.3 F (36.8 C)  TempSrc: Oral  SpO2: 97%  Weight: 277 lb 6.4 oz (125.8 kg)    Body mass index is 44.1 kg/m.   Physical Exam Vitals reviewed.  Constitutional:      Appearance: Normal appearance.  HENT:     Right Ear: Tympanic membrane, ear canal and external ear normal.     Left Ear: Tympanic membrane, ear canal and external ear normal.     Mouth/Throat:     Mouth: Mucous membranes are moist.     Pharynx: Oropharynx is clear. Posterior oropharyngeal erythema present.  Eyes:     Conjunctiva/sclera: Conjunctivae normal.  Cardiovascular:     Rate and Rhythm: Normal rate and regular rhythm.  Pulmonary:     Effort: Pulmonary effort is normal.     Breath sounds: Normal breath sounds.  Neurological:     Mental Status: She is alert.      Impression and Plan:  Viral URI with cough  Community acquired pneumonia of right lower lobe of lung -     Hydrocod Poli-Chlorphe Poli ER; Take 5 mLs by mouth at bedtime as needed.  Dispense: 70 mL; Refill: 0  Other orders -     Nitroglycerin ; Place 1 tablet (0.4 mg total) under the tongue every 5 (five) minutes as needed for chest pain.  Dispense: 90 tablet; Refill: 3  -Given exam findings, PNA, pharyngitis, ear infection are not likely, hence abx have not been prescribed. -Have advised rest, fluids, OTC antihistamines, cough suppressants and mucinex. -RTC if no improvement in 10-14 days. - Cough suppressant prescribed.    Time spent:22 minutes reviewing chart, interviewing and examining patient and formulating plan of care.     Tully Theophilus Andrews, MD [facility]     [1]  Allergies Allergen Reactions   Achromycin [Tetracycline Hcl] Shortness Of Breath    Dyspnea     Penicillins     Facial angioedema Did it  involve swelling of the face/tongue/throat, SOB, or low BP? Yes Did it involve sudden or severe rash/hives, skin peeling, or any reaction on the inside of your mouth or nose? No Did you need to seek medical attention at a hospital or doctor's office? No When did it last happen?      30+ years If all above answers are NO, may proceed with cephalosporin use.    Sulfamethoxazole-Trimethoprim Hives   Nickel Itching   Oxycodone  Itching   Zanaflex [Tizanidine  Hydrochloride] Other (See Comments)    Causes more pain   [2]  Current Outpatient Medications:    ALPRAZolam  (XANAX ) 1 MG tablet, TAKE 1 TABLET BY MOUTH THREE TIMES A DAY AS NEEDED FOR ANXIETY, Disp: 90 tablet, Rfl: 0   amiodarone  (PACERONE ) 100 MG tablet, Take 1 tablet (100 mg total) by mouth daily., Disp: 90 tablet, Rfl: 3   apixaban  (ELIQUIS ) 5 MG TABS tablet, Take 1 tablet (5 mg total) by mouth 2 (two) times daily., Disp: 60 tablet, Rfl: 5   atorvastatin  (LIPITOR) 40 MG tablet, Take 1 tablet (40 mg total) by mouth daily., Disp: 90 tablet, Rfl: 3   azelastine  (ASTELIN ) 0.1 % nasal spray, Place 2 sprays into both nostrils 2 (two) times daily. Use in each nostril as directed, Disp: 30 mL, Rfl: 12   Calcium  Carb-Cholecalciferol (CALCIUM  600 + D PO), Take 600 mg by mouth daily., Disp: , Rfl:    cetirizine  (ZYRTEC ) 10 MG tablet, TAKE 1 TABLET BY MOUTH EVERY DAY, Disp: 90 tablet, Rfl: 1   cyclobenzaprine  (FLEXERIL ) 5 MG tablet, TAKE 1 TABLET BY MOUTH AT BEDTIME AS NEEDED FOR MUSCLE SPASMS., Disp: 30 tablet, Rfl: 0   dapagliflozin  propanediol (FARXIGA ) 10 MG TABS tablet, Take 1 tablet (10 mg total) by mouth daily., Disp: 30 tablet, Rfl: 11   fluocinonide (LIDEX) 0.05 % external solution, Apply 1 application topically daily as needed (rosacea)., Disp: , Rfl:    FLUoxetine  (PROZAC ) 40 MG capsule, TAKE 1 CAPSULE BY MOUTH EVERY DAY, Disp: 90 capsule, Rfl: 1   fluticasone  (FLONASE ) 50 MCG/ACT nasal spray, SPRAY 2 SPRAYS INTO EACH NOSTRIL EVERY DAY,  Disp: 48 mL, Rfl: 2   furosemide  (LASIX ) 40 MG tablet, Take 1 tablet (40 mg total) by mouth as needed. For weight gain of 3 lbs in 24 hours or 5 lbs in a week, Disp: 90 tablet, Rfl: 1   gabapentin  (NEURONTIN ) 300 MG capsule, TAKE 1 CAPSULE BY MOUTH EVERYDAY AT BEDTIME, Disp: 90 capsule, Rfl: 1   methocarbamol  (ROBAXIN ) 750 MG tablet, Take 750 mg by mouth 3 (three) times daily as needed for muscle spasms., Disp: , Rfl:    metroNIDAZOLE  (METROCREAM ) 0.75 % cream, Apply 1 application topically 2 (two) times daily as needed (rosacea). , Disp: , Rfl:    Multiple Vitamin (MULTIVITAMIN WITH MINERALS) TABS tablet, Take 1 tablet by mouth daily. Centrum Silver, Disp: , Rfl:    omeprazole  (PRILOSEC) 40 MG capsule, TAKE 1 CAPSULE BY MOUTH EVERY DAY, Disp: 90 capsule, Rfl: 1   potassium chloride  (KLOR-CON ) 10 MEQ tablet, Take 2 tablets (20 mEq total) by mouth as needed. For weight gain of 3 lbs in 24 hours or 5 lbs in a week, Disp: 180 tablet, Rfl: 3   Propylene Glycol (SYSTANE BALANCE) 0.6 % SOLN, Place 1 drop into both eyes daily as needed (Dry eyes)., Disp: , Rfl:    sacubitril -valsartan  (ENTRESTO ) 97-103 MG, Take 1 tablet by mouth 2 (two) times daily., Disp: 60 tablet, Rfl: 1   chlorpheniramine-HYDROcodone  (TUSSIONEX) 10-8 MG/5ML, Take 5 mLs by mouth at bedtime as needed., Disp: 70 mL, Rfl: 0   nitroGLYCERIN  (NITROSTAT ) 0.4 MG SL tablet, Place 1 tablet (0.4 mg total) under the tongue every 5 (five) minutes as needed for chest pain., Disp: 90 tablet, Rfl: 3

## 2024-09-09 ENCOUNTER — Encounter: Payer: Self-pay | Admitting: Family Medicine

## 2024-09-09 ENCOUNTER — Ambulatory Visit

## 2024-09-09 ENCOUNTER — Ambulatory Visit: Admitting: Family Medicine

## 2024-09-09 ENCOUNTER — Ambulatory Visit: Payer: Self-pay

## 2024-09-09 VITALS — BP 138/80 | HR 71 | Temp 97.8°F | Resp 16 | Ht 66.5 in | Wt 282.6 lb

## 2024-09-09 DIAGNOSIS — R053 Chronic cough: Secondary | ICD-10-CM | POA: Diagnosis not present

## 2024-09-09 DIAGNOSIS — R062 Wheezing: Secondary | ICD-10-CM | POA: Diagnosis not present

## 2024-09-09 MED ORDER — ALBUTEROL SULFATE HFA 108 (90 BASE) MCG/ACT IN AERS: 2.0000 | INHALATION_SPRAY | Freq: Four times a day (QID) | RESPIRATORY_TRACT | 0 refills | Status: AC | PRN

## 2024-09-09 MED ORDER — PREDNISONE 20 MG PO TABS: 40.0000 mg | ORAL_TABLET | Freq: Every day | ORAL | 0 refills | Status: DC

## 2024-09-09 NOTE — Patient Instructions (Addendum)
 A few things to remember from today's visit:  Persistent cough - Plan: DG Chest 2 View  Wheezing - Plan: predniSONE  (DELTASONE ) 20 MG tablet, albuterol  (VENTOLIN  HFA) 108 (90 Base) MCG/ACT inhaler  Lungs sound clear today. Prednisone  to take with food. Albuterol  inh 2 puff every 6 hours for a week then as needed for wheezing or shortness of breath.  Monitor for fever. Continue Flonase  and cough medication. Follow in 2 weeks if not any better.  Do not use My Chart to request refills or for acute issues that need immediate attention. If you send a my chart message, it may take a few days to be addressed, specially if I am not in the office.  Please be sure medication list is accurate. If a new problem present, please set up appointment sooner than planned today.

## 2024-09-09 NOTE — Progress Notes (Signed)
 "  ACUTE VISIT Chief Complaint  Patient presents with   Follow-up    Ongoing cough and congestion for 6-8 weeks    Discussed the use of AI scribe software for clinical note transcription with the patient, who gave verbal consent to proceed. History of Present Illness Kathy [name] is a 73 year old female with PMHx significant for HFpEF, GERD, CKD III, PAF, allergic rhinitis, OSA, and HTN here today c/o persistent cough for six to eight weeks.  Reports having problem since late October or early November, with intermittent episodes. States that she was diagnosed with pneumonia on November 18th and completing treatment, the cough has persisted. Initially, her sputum was clear, but it has developed a yellow tinge and feels thicker. No fever, but she occasionally feels very cold, requiring an electric blanket or heating pad.  She experiences intermittent wheezing and a sensation of chest tightness, describing it as hearing herself 'whistle' occasionally. No history of tobacco use or asthma, she mentions that her mother, grandmother, and one son have asthma.  She was prescribed medication for the cough and previously took Azithromycin, which improved her condition but did not resolve the cough completely.  She uses Flonase  and generic Zyrtec  daily for allergies.  She uses a CPAP machine for sleep and has an adjustable bed to keep her head elevated, which helps with her back issues. No orthopnea or PND. GERD: She takes omeprazole , denies recent heartburn symptoms.  Review of Systems  Constitutional:  Positive for fatigue. Negative for activity change, appetite change and unexpected weight change.  HENT:  Positive for postnasal drip and rhinorrhea. Negative for sore throat and trouble swallowing.   Cardiovascular:  Negative for chest pain, palpitations and leg swelling.  Gastrointestinal:  Negative for abdominal pain, nausea and vomiting.  Genitourinary:  Negative for decreased urine volume,  dysuria and hematuria.  Skin:  Negative for rash.  Neurological:  Negative for syncope and headaches.  Psychiatric/Behavioral:  Negative for confusion and hallucinations.   See other pertinent positives and negatives in HPI.  Medications Ordered Prior to Encounter[1]  Past Medical History:  Diagnosis Date   Adenomatous colon polyp    2012   Anxiety and depression    Arthritis    Osteoarthritis, Dr Hiram   Cancer [facility])    skin - left foot excised   Chronic diastolic CHF (congestive heart failure) (HCC)    DDD (degenerative disc disease), cervical    Deaf, right    DVT (deep venous thrombosis) (HCC)    RLE DVT 08/2004, post right knee arthroscopy   Dysrhythmia    A.Fib   Fibromyalgia    Dr Ludie Piety, Rheu   GERD (gastroesophageal reflux disease)    H/O hiatal hernia    Headache(784.0)    PAST HX MILD MIGRAINES - resolved per patient 08/18/20   History of kidney stones    passed stones   HTN (hypertension)    IBS (irritable bowel syndrome)    Internal hemorrhoid    Lumbar radiculopathy    Obesity    OSA on CPAP    Moderate with AHI 20/hr with nocturnal hypoxemia on CPAP   PAF (paroxysmal atrial fibrillation) (HCC) 06/2018   Pulmonary hypertension (HCC)    Allergies[2]  Social History   Socioeconomic History   Marital status: Widowed    Spouse name: Not on file   Number of children: 2   Years of education: Not on file   Highest education level: 12th grade  Occupational History  Occupation: retired  Tobacco Use   Smoking status: Never   Smokeless tobacco: Never  Vaping Use   Vaping status: Never Used  Substance and Sexual Activity   Alcohol  use: Yes    Comment: occasional   Drug use: No   Sexual activity: Not Currently    Birth control/protection: Surgical    Comment: Hysterectomy  Other Topics Concern   Not on file  Social History Narrative   0 caffeine drinks, likes to read, crotchet, spend time with family & friends, very social   Social  Drivers of Health   Tobacco Use: Low Risk (09/09/2024)   Patient History    Smoking Tobacco Use: Never    Smokeless Tobacco Use: Never    Passive Exposure: Not on file  Financial Resource Strain: Low Risk (11/20/2023)   Overall Financial Resource Strain (CARDIA)    Difficulty of Paying Living Expenses: Not hard at all  Food Insecurity: No Food Insecurity (11/20/2023)   Hunger Vital Sign    Worried About Running Out of Food in the Last Year: Never true    Ran Out of Food in the Last Year: Never true  Transportation Needs: No Transportation Needs (11/20/2023)   PRAPARE - Administrator, Civil Service (Medical): No    Lack of Transportation (Non-Medical): No  Physical Activity: Inactive (11/20/2023)   Exercise Vital Sign    Days of Exercise per Week: 0 days    Minutes of Exercise per Session: 0 min  Stress: Stress Concern Present (11/20/2023)   [employer] of Occupational Health - Occupational Stress Questionnaire    Feeling of Stress : Rather much  Social Connections: Moderately Integrated (11/20/2023)   Social Connection and Isolation Panel    Frequency of Communication with Friends and Family: More than three times a week    Frequency of Social Gatherings with Friends and Family: Three times a week    Attends Religious Services: More than 4 times per year    Active Member of Clubs or Organizations: Yes    Attends Banker Meetings: More than 4 times per year    Marital Status: Widowed  Depression (PHQ2-9): Low Risk (11/20/2023)   Depression (PHQ2-9)    PHQ-2 Score: 0  Recent Concern: Depression (PHQ2-9) - High Risk (08/29/2023)   Depression (PHQ2-9)    PHQ-2 Score: 14  Alcohol  Screen: Low Risk (11/20/2023)   Alcohol  Screen    Last Alcohol  Screening Score (AUDIT): 1  Housing: Low Risk (11/20/2023)   Housing Stability Vital Sign    Unable to Pay for Housing in the Last Year: No    Number of Times Moved in the Last Year: 0    Homeless in the Last Year: No   Utilities: Not At Risk (11/20/2023)   AHC Utilities    Threatened with loss of utilities: No  Health Literacy: Adequate Health Literacy (11/20/2023)   B1300 Health Literacy    Frequency of need for help with medical instructions: Never    Vitals:   09/09/24 1616  BP: 138/80  Pulse: 71  Resp: 16  Temp: 97.8 F (36.6 C)  SpO2: 97%   Body mass index is 44.93 kg/m.  Physical Exam Vitals and nursing note reviewed.  Constitutional:      General: She is not in acute distress.    Appearance: She is well-developed. She is not ill-appearing.  HENT:     Head: Normocephalic and atraumatic.     Nose: Septal deviation and rhinorrhea present.  Right Turbinates: Enlarged.     Left Turbinates: Enlarged.     Mouth/Throat:     Mouth: Mucous membranes are moist.     Pharynx: Oropharynx is clear.  Eyes:     Conjunctiva/sclera: Conjunctivae normal.  Cardiovascular:     Rate and Rhythm: Normal rate and regular rhythm.     Heart sounds: Murmur (Sofr SEM LUSB) heard.  Pulmonary:     Effort: Pulmonary effort is normal. No respiratory distress.     Breath sounds: Normal breath sounds. No stridor.  Abdominal:     Palpations: Abdomen is soft. There is no mass.     Tenderness: There is no abdominal tenderness.  Lymphadenopathy:     Cervical: No cervical adenopathy.  Skin:    General: Skin is warm.     Findings: No erythema or rash.  Neurological:     General: No focal deficit present.     Mental Status: She is alert and oriented to person, place, and time.     Gait: Gait normal.  Psychiatric:        Mood and Affect: Mood and affect normal.   ASSESSMENT AND PLAN:  Ms. Kathy [name] was seen today for follow-up.  Diagnoses and all orders for this visit: Orders Placed This Encounter  Procedures   DG Chest 2 View   Persistent cough Reports having intermittent episode of cough since late 06/2024.  She has been treated with abx for CAP. CXR done in 07/2024, because symptoms have  not resolved, recommend repeating imaging today, Lung auscultation today negative, non productive cough a few times during her visit, not in respiratory distress. We discussed other possible causes, including GERD and less likely HF. Instructed about warning signs. Further recommendations according to CXR results. I do not appreciate opacities suggestive of pneumonia, positive for cardiomegaly, no significant changes when compared with CXR done on 07/31/24.  -     DG Chest 2 View; Future  Wheezing Not present today. After discussion of some side effects, she agrees with short course of Prednisone  for 3-5 days. Albuterol  inh 2 puff every 6 hours for a week then as needed for wheezing or shortness of breath.  Some side effects of medications discussed. Clearly instructed about warning signs.  -     predniSONE ; Take 2 tablets (40 mg total) by mouth daily with breakfast for 5 days.  Dispense: 10 tablet; Refill: 0 -     Albuterol  Sulfate HFA; Inhale 2 puffs into the lungs every 6 (six) hours as needed for wheezing or shortness of breath.  Dispense: 8 g; Refill: 0  Return in about 2 weeks (around 09/23/2024), or if symptoms worsen or fail to improve.  Paticia Moster G. Accalia Rigdon, MD  [facility]. [place] office.     [1]  Current Outpatient Medications on File Prior to Visit  Medication Sig Dispense Refill   ALPRAZolam  (XANAX ) 1 MG tablet TAKE 1 TABLET BY MOUTH THREE TIMES A DAY AS NEEDED FOR ANXIETY 90 tablet 0   amiodarone  (PACERONE ) 100 MG tablet Take 1 tablet (100 mg total) by mouth daily. 90 tablet 3   apixaban  (ELIQUIS ) 5 MG TABS tablet Take 1 tablet (5 mg total) by mouth 2 (two) times daily. 60 tablet 5   atorvastatin  (LIPITOR) 40 MG tablet Take 1 tablet (40 mg total) by mouth daily. 90 tablet 3   azelastine  (ASTELIN ) 0.1 % nasal spray Place 2 sprays into both nostrils 2 (two) times daily. Use in each nostril as directed 30 mL  12   Calcium  Carb-Cholecalciferol (CALCIUM  600 + D PO)  Take 600 mg by mouth daily.     cetirizine  (ZYRTEC ) 10 MG tablet TAKE 1 TABLET BY MOUTH EVERY DAY 90 tablet 1   chlorpheniramine-HYDROcodone  (TUSSIONEX) 10-8 MG/5ML Take 5 mLs by mouth at bedtime as needed. 70 mL 0   cyclobenzaprine  (FLEXERIL ) 5 MG tablet TAKE 1 TABLET BY MOUTH AT BEDTIME AS NEEDED FOR MUSCLE SPASMS. 30 tablet 0   dapagliflozin  propanediol (FARXIGA ) 10 MG TABS tablet Take 1 tablet (10 mg total) by mouth daily. 30 tablet 11   fluocinonide (LIDEX) 0.05 % external solution Apply 1 application topically daily as needed (rosacea).     FLUoxetine  (PROZAC ) 40 MG capsule TAKE 1 CAPSULE BY MOUTH EVERY DAY 90 capsule 1   fluticasone  (FLONASE ) 50 MCG/ACT nasal spray SPRAY 2 SPRAYS INTO EACH NOSTRIL EVERY DAY 48 mL 2   furosemide  (LASIX ) 40 MG tablet Take 1 tablet (40 mg total) by mouth as needed. For weight gain of 3 lbs in 24 hours or 5 lbs in a week 90 tablet 1   gabapentin  (NEURONTIN ) 300 MG capsule TAKE 1 CAPSULE BY MOUTH EVERYDAY AT BEDTIME 90 capsule 1   methocarbamol  (ROBAXIN ) 750 MG tablet Take 750 mg by mouth 3 (three) times daily as needed for muscle spasms.     metroNIDAZOLE  (METROCREAM ) 0.75 % cream Apply 1 application topically 2 (two) times daily as needed (rosacea).      Multiple Vitamin (MULTIVITAMIN WITH MINERALS) TABS tablet Take 1 tablet by mouth daily. Centrum Silver     nitroGLYCERIN  (NITROSTAT ) 0.4 MG SL tablet Place 1 tablet (0.4 mg total) under the tongue every 5 (five) minutes as needed for chest pain. 90 tablet 3   omeprazole  (PRILOSEC) 40 MG capsule TAKE 1 CAPSULE BY MOUTH EVERY DAY 90 capsule 1   potassium chloride  (KLOR-CON ) 10 MEQ tablet Take 2 tablets (20 mEq total) by mouth as needed. For weight gain of 3 lbs in 24 hours or 5 lbs in a week 180 tablet 3   Propylene Glycol (SYSTANE BALANCE) 0.6 % SOLN Place 1 drop into both eyes daily as needed (Dry eyes).     sacubitril -valsartan  (ENTRESTO ) 97-103 MG Take 1 tablet by mouth 2 (two) times daily. 60 tablet 1   No  current facility-administered medications on file prior to visit.  [2]  Allergies Allergen Reactions   Achromycin [Tetracycline Hcl] Shortness Of Breath    Dyspnea     Penicillins     Facial angioedema Did it involve swelling of the face/tongue/throat, SOB, or low BP? Yes Did it involve sudden or severe rash/hives, skin peeling, or any reaction on the inside of your mouth or nose? No Did you need to seek medical attention at a hospital or doctor's office? No When did it last happen?      30+ years If all above answers are NO, may proceed with cephalosporin use.    Sulfamethoxazole-Trimethoprim Hives   Nickel Itching   Oxycodone  Itching   Zanaflex [Tizanidine Hydrochloride] Other (See Comments)    Causes more pain    "

## 2024-09-09 NOTE — Telephone Encounter (Signed)
 Patient scheduled acute visit with appointment with Dr. Jordan to ensure she is not developing PNA.

## 2024-09-09 NOTE — Telephone Encounter (Signed)
 FYI Only or Action Required?: Action required by provider: clinical question for provider and update on patient condition.  Patient was last seen in primary care on 09/02/2024 by Theophilus Andrews, Tully GRADE, MD.  Called Nurse Triage reporting Cough.  Symptoms began several days ago.  Interventions attempted: Prescription medications: TUSSIONEX.  Symptoms are: gradually worsening.  Triage Disposition: See Physician Within 24 Hours  Patient/caregiver understands and will follow disposition?: Yes          Copied from CRM #[id number]. Topic: Clinical - Red Word Triage >> Sep 09, 2024  9:36 AM Terri G wrote: Red Word that prompted transfer to Nurse Triage: productive cough, shortness of breath, coughing up discolored phlegm Reason for Disposition  [1] Continuous (nonstop) coughing interferes with work or school AND [2] no improvement using cough treatment per Care Advice  Answer Assessment - Initial Assessment Questions 1. ONSET: When did the cough begin?      Ongoing for a week, seen by PCP for similar symptoms. Medications prescribed, no relief noted.    2. SEVERITY: How bad is the cough today?      Worsening since yesterday.   3. SPUTUM: Describe the color of your sputum (e.g., none, dry cough; clear, white, yellow, green)     Yellow    4. HEMOPTYSIS: Are you coughing up any blood? If Yes, ask: How much? (e.g., flecks, streaks, tablespoons, etc.)     No    5. DIFFICULTY BREATHING: Are you having difficulty breathing? If Yes, ask: How bad is it? (e.g., mild, moderate, severe)       SOB related to the cough, she states it is not constant but happens with coughing spells are present.   6. FEVER: Do you have a fever? If Yes, ask: What is your temperature, how was it measured, and when did it start?     No    7. CARDIAC HISTORY: Do you have any history of heart disease? (e.g., heart attack, congestive heart failure)      Heart valve disease, CHF     8. LUNG HISTORY: Do you have any history of lung disease?  (e.g., pulmonary embolus, asthma, emphysema)     No    10. OTHER SYMPTOMS: Do you have any other symptoms? (e.g., runny nose, wheezing, chest pain)       No    Patient called in to triage with complaints of productive cough, SOB during coughing spells. This has been ongoing for several days. PCP is aware.  The patient stated her symptoms are gradually worsening.  For home care, the patient is taking TUSSIONEX, mild relief noted.   Appointment  declined for further evaluation per office visit note; she states she has been seen for the same symptoms recently and would like  PCP to call in something else for the symptoms. Please advise.  Protocols used: Cough - Acute Productive-A-AH

## 2024-09-10 ENCOUNTER — Inpatient Hospital Stay (HOSPITAL_BASED_OUTPATIENT_CLINIC_OR_DEPARTMENT_OTHER)
Admission: EM | Admit: 2024-09-10 | Discharge: 2024-09-14 | DRG: 193 | Disposition: A | Discharge status: Home / Self Care | Attending: Internal Medicine | Admitting: Internal Medicine

## 2024-09-10 ENCOUNTER — Emergency Department (HOSPITAL_BASED_OUTPATIENT_CLINIC_OR_DEPARTMENT_OTHER)

## 2024-09-10 ENCOUNTER — Other Ambulatory Visit: Payer: Self-pay

## 2024-09-10 DIAGNOSIS — Z85828 Personal history of other malignant neoplasm of skin: Secondary | ICD-10-CM

## 2024-09-10 DIAGNOSIS — M797 Fibromyalgia: Secondary | ICD-10-CM | POA: Diagnosis present

## 2024-09-10 DIAGNOSIS — Z860101 Personal history of adenomatous and serrated colon polyps: Secondary | ICD-10-CM

## 2024-09-10 DIAGNOSIS — Z885 Allergy status to narcotic agent status: Secondary | ICD-10-CM

## 2024-09-10 DIAGNOSIS — Z86718 Personal history of other venous thrombosis and embolism: Secondary | ICD-10-CM

## 2024-09-10 DIAGNOSIS — R7401 Elevation of levels of liver transaminase levels: Secondary | ICD-10-CM | POA: Diagnosis present

## 2024-09-10 DIAGNOSIS — H9191 Unspecified hearing loss, right ear: Secondary | ICD-10-CM | POA: Diagnosis present

## 2024-09-10 DIAGNOSIS — Z803 Family history of malignant neoplasm of breast: Secondary | ICD-10-CM

## 2024-09-10 DIAGNOSIS — J101 Influenza due to other identified influenza virus with other respiratory manifestations: Principal | ICD-10-CM | POA: Diagnosis present

## 2024-09-10 DIAGNOSIS — Z9049 Acquired absence of other specified parts of digestive tract: Secondary | ICD-10-CM

## 2024-09-10 DIAGNOSIS — Z7901 Long term (current) use of anticoagulants: Secondary | ICD-10-CM

## 2024-09-10 DIAGNOSIS — E66813 Obesity, class 3: Secondary | ICD-10-CM | POA: Diagnosis present

## 2024-09-10 DIAGNOSIS — Z79899 Other long term (current) drug therapy: Secondary | ICD-10-CM

## 2024-09-10 DIAGNOSIS — Z881 Allergy status to other antibiotic agents status: Secondary | ICD-10-CM

## 2024-09-10 DIAGNOSIS — K219 Gastro-esophageal reflux disease without esophagitis: Secondary | ICD-10-CM | POA: Diagnosis present

## 2024-09-10 DIAGNOSIS — G4730 Sleep apnea, unspecified: Secondary | ICD-10-CM | POA: Diagnosis present

## 2024-09-10 DIAGNOSIS — I480 Paroxysmal atrial fibrillation: Secondary | ICD-10-CM | POA: Diagnosis present

## 2024-09-10 DIAGNOSIS — Z96643 Presence of artificial hip joint, bilateral: Secondary | ICD-10-CM | POA: Diagnosis present

## 2024-09-10 DIAGNOSIS — G4733 Obstructive sleep apnea (adult) (pediatric): Secondary | ICD-10-CM | POA: Diagnosis present

## 2024-09-10 DIAGNOSIS — Z6841 Body Mass Index (BMI) 40.0 and over, adult: Secondary | ICD-10-CM

## 2024-09-10 DIAGNOSIS — R5381 Other malaise: Secondary | ICD-10-CM | POA: Diagnosis present

## 2024-09-10 DIAGNOSIS — F32A Depression, unspecified: Secondary | ICD-10-CM | POA: Diagnosis present

## 2024-09-10 DIAGNOSIS — Z90710 Acquired absence of both cervix and uterus: Secondary | ICD-10-CM

## 2024-09-10 DIAGNOSIS — J9601 Acute respiratory failure with hypoxia: Secondary | ICD-10-CM | POA: Diagnosis present

## 2024-09-10 DIAGNOSIS — I10 Essential (primary) hypertension: Secondary | ICD-10-CM | POA: Diagnosis present

## 2024-09-10 DIAGNOSIS — Z8249 Family history of ischemic heart disease and other diseases of the circulatory system: Secondary | ICD-10-CM

## 2024-09-10 DIAGNOSIS — N183 Chronic kidney disease, stage 3 unspecified: Secondary | ICD-10-CM | POA: Diagnosis present

## 2024-09-10 DIAGNOSIS — J9600 Acute respiratory failure, unspecified whether with hypoxia or hypercapnia: Secondary | ICD-10-CM | POA: Insufficient documentation

## 2024-09-10 DIAGNOSIS — Z825 Family history of asthma and other chronic lower respiratory diseases: Secondary | ICD-10-CM

## 2024-09-10 DIAGNOSIS — Z880 Allergy status to penicillin: Secondary | ICD-10-CM

## 2024-09-10 DIAGNOSIS — F419 Anxiety disorder, unspecified: Secondary | ICD-10-CM | POA: Diagnosis present

## 2024-09-10 DIAGNOSIS — Z87442 Personal history of urinary calculi: Secondary | ICD-10-CM

## 2024-09-10 DIAGNOSIS — Z823 Family history of stroke: Secondary | ICD-10-CM

## 2024-09-10 DIAGNOSIS — I959 Hypotension, unspecified: Secondary | ICD-10-CM | POA: Diagnosis not present

## 2024-09-10 DIAGNOSIS — I130 Hypertensive heart and chronic kidney disease with heart failure and stage 1 through stage 4 chronic kidney disease, or unspecified chronic kidney disease: Secondary | ICD-10-CM | POA: Diagnosis present

## 2024-09-10 DIAGNOSIS — Z85820 Personal history of malignant melanoma of skin: Secondary | ICD-10-CM

## 2024-09-10 DIAGNOSIS — E876 Hypokalemia: Secondary | ICD-10-CM | POA: Diagnosis present

## 2024-09-10 DIAGNOSIS — R531 Weakness: Secondary | ICD-10-CM | POA: Diagnosis present

## 2024-09-10 DIAGNOSIS — N1832 Chronic kidney disease, stage 3b: Secondary | ICD-10-CM | POA: Diagnosis present

## 2024-09-10 DIAGNOSIS — Z888 Allergy status to other drugs, medicaments and biological substances status: Secondary | ICD-10-CM

## 2024-09-10 DIAGNOSIS — E785 Hyperlipidemia, unspecified: Secondary | ICD-10-CM | POA: Diagnosis present

## 2024-09-10 DIAGNOSIS — I272 Pulmonary hypertension, unspecified: Secondary | ICD-10-CM | POA: Diagnosis present

## 2024-09-10 DIAGNOSIS — Z1152 Encounter for screening for COVID-19: Secondary | ICD-10-CM

## 2024-09-10 DIAGNOSIS — I5032 Chronic diastolic (congestive) heart failure: Secondary | ICD-10-CM | POA: Diagnosis present

## 2024-09-10 DIAGNOSIS — Z833 Family history of diabetes mellitus: Secondary | ICD-10-CM

## 2024-09-10 LAB — CBC WITH DIFFERENTIAL/PLATELET
Abs Immature Granulocytes: 0.02 K/uL (ref 0.00–0.07)
Basophils Absolute: 0 K/uL (ref 0.0–0.1)
Basophils Relative: 0 %
Eosinophils Absolute: 0 K/uL (ref 0.0–0.5)
Eosinophils Relative: 0 %
HCT: 32.2 % — ABNORMAL LOW (ref 36.0–46.0)
Hemoglobin: 10.3 g/dL — ABNORMAL LOW (ref 12.0–15.0)
Immature Granulocytes: 0 %
Lymphocytes Relative: 15 %
Lymphs Abs: 0.7 K/uL (ref 0.7–4.0)
MCH: 26.8 pg (ref 26.0–34.0)
MCHC: 32 g/dL (ref 30.0–36.0)
MCV: 83.6 fL (ref 80.0–100.0)
Monocytes Absolute: 0.2 K/uL (ref 0.1–1.0)
Monocytes Relative: 4 %
Neutro Abs: 3.8 K/uL (ref 1.7–7.7)
Neutrophils Relative %: 81 %
Platelets: 253 K/uL (ref 150–400)
RBC: 3.85 MIL/uL — ABNORMAL LOW (ref 3.87–5.11)
RDW: 16.6 % — ABNORMAL HIGH (ref 11.5–15.5)
WBC: 4.8 K/uL (ref 4.0–10.5)
nRBC: 0 % (ref 0.0–0.2)

## 2024-09-10 LAB — I-STAT VENOUS BLOOD GAS, ED
Acid-Base Excess: 2 mmol/L (ref 0.0–2.0)
Bicarbonate: 27.5 mmol/L (ref 20.0–28.0)
Calcium, Ion: 1.12 mmol/L — ABNORMAL LOW (ref 1.15–1.40)
HCT: 34 % — ABNORMAL LOW (ref 36.0–46.0)
Hemoglobin: 11.6 g/dL — ABNORMAL LOW (ref 12.0–15.0)
O2 Saturation: 69 %
Patient temperature: 100.8
Potassium: 3.8 mmol/L (ref 3.5–5.1)
Sodium: 133 mmol/L — ABNORMAL LOW (ref 135–145)
TCO2: 29 mmol/L (ref 22–32)
pCO2, Ven: 49.8 mmHg (ref 44–60)
pH, Ven: 7.356 (ref 7.25–7.43)
pO2, Ven: 41 mmHg (ref 32–45)

## 2024-09-10 LAB — COMPREHENSIVE METABOLIC PANEL WITH GFR
ALT: 51 U/L — ABNORMAL HIGH (ref 0–44)
AST: 67 U/L — ABNORMAL HIGH (ref 15–41)
Albumin: 4 g/dL (ref 3.5–5.0)
Alkaline Phosphatase: 88 U/L (ref 38–126)
Anion gap: 14 (ref 5–15)
BUN: 33 mg/dL — ABNORMAL HIGH (ref 8–23)
CO2: 25 mmol/L (ref 22–32)
Calcium: 8.7 mg/dL — ABNORMAL LOW (ref 8.9–10.3)
Chloride: 94 mmol/L — ABNORMAL LOW (ref 98–111)
Creatinine, Ser: 1.62 mg/dL — ABNORMAL HIGH (ref 0.44–1.00)
GFR, Estimated: 33 mL/min — ABNORMAL LOW
Glucose, Bld: 211 mg/dL — ABNORMAL HIGH (ref 70–99)
Potassium: 3.3 mmol/L — ABNORMAL LOW (ref 3.5–5.1)
Sodium: 132 mmol/L — ABNORMAL LOW (ref 135–145)
Total Bilirubin: 0.4 mg/dL (ref 0.0–1.2)
Total Protein: 6.4 g/dL — ABNORMAL LOW (ref 6.5–8.1)

## 2024-09-10 LAB — RESP PANEL BY RT-PCR (RSV, FLU A&B, COVID)  RVPGX2
Influenza A by PCR: POSITIVE — AB
Influenza B by PCR: NEGATIVE
Resp Syncytial Virus by PCR: NEGATIVE
SARS Coronavirus 2 by RT PCR: NEGATIVE

## 2024-09-10 LAB — PRO BRAIN NATRIURETIC PEPTIDE: Pro Brain Natriuretic Peptide: 1421 pg/mL — ABNORMAL HIGH

## 2024-09-10 MED ORDER — POTASSIUM CHLORIDE CRYS ER 20 MEQ PO TBCR
40.0000 meq | EXTENDED_RELEASE_TABLET | Freq: Once | ORAL | Status: AC
  Administered 2024-09-10: 40 meq via ORAL
  Filled 2024-09-10: qty 2

## 2024-09-10 MED ORDER — ALBUTEROL SULFATE (2.5 MG/3ML) 0.083% IN NEBU
INHALATION_SOLUTION | RESPIRATORY_TRACT | Status: AC
  Administered 2024-09-10: 2.5 mg
  Filled 2024-09-10: qty 6

## 2024-09-10 MED ORDER — APIXABAN 5 MG PO TABS
5.0000 mg | ORAL_TABLET | Freq: Two times a day (BID) | ORAL | Status: DC
  Administered 2024-09-10 – 2024-09-14 (×8): 5 mg via ORAL
  Filled 2024-09-10 (×8): qty 1

## 2024-09-10 MED ORDER — ACETAMINOPHEN 325 MG PO TABS
650.0000 mg | ORAL_TABLET | Freq: Four times a day (QID) | ORAL | Status: DC | PRN
  Administered 2024-09-11 – 2024-09-14 (×4): 650 mg via ORAL
  Filled 2024-09-10 (×4): qty 2

## 2024-09-10 MED ORDER — OSELTAMIVIR PHOSPHATE 30 MG PO CAPS
30.0000 mg | ORAL_CAPSULE | Freq: Two times a day (BID) | ORAL | Status: DC
  Administered 2024-09-11 – 2024-09-12 (×3): 30 mg via ORAL
  Filled 2024-09-10 (×3): qty 1

## 2024-09-10 MED ORDER — DM-GUAIFENESIN ER 30-600 MG PO TB12
1.0000 | ORAL_TABLET | Freq: Two times a day (BID) | ORAL | Status: DC
  Administered 2024-09-10 – 2024-09-14 (×8): 1 via ORAL
  Filled 2024-09-10 (×8): qty 1

## 2024-09-10 MED ORDER — DAPAGLIFLOZIN PROPANEDIOL 10 MG PO TABS: 10.0000 mg | ORAL_TABLET | Freq: Every day | ORAL | Status: DC

## 2024-09-10 MED ORDER — ALPRAZOLAM 0.5 MG PO TABS
1.0000 mg | ORAL_TABLET | Freq: Three times a day (TID) | ORAL | Status: DC | PRN
  Administered 2024-09-10 – 2024-09-13 (×6): 1 mg via ORAL
  Filled 2024-09-10 (×6): qty 2

## 2024-09-10 MED ORDER — POLYETHYLENE GLYCOL 3350 17 G PO PACK: 17.0000 g | PACK | Freq: Every day | ORAL | Status: DC | PRN

## 2024-09-10 MED ORDER — HYDROCOD POLI-CHLORPHE POLI ER 10-8 MG/5ML PO SUER
5.0000 mL | Freq: Two times a day (BID) | ORAL | Status: DC | PRN
  Administered 2024-09-11 – 2024-09-14 (×6): 5 mL via ORAL
  Filled 2024-09-10 (×6): qty 115

## 2024-09-10 MED ORDER — FUROSEMIDE 40 MG PO TABS: 40.0000 mg | ORAL_TABLET | ORAL | Status: DC | PRN

## 2024-09-10 MED ORDER — METHYLPREDNISOLONE SODIUM SUCC 125 MG IJ SOLR
125.0000 mg | Freq: Once | INTRAMUSCULAR | Status: AC
  Administered 2024-09-10: 125 mg via INTRAVENOUS
  Filled 2024-09-10: qty 2

## 2024-09-10 MED ORDER — IPRATROPIUM-ALBUTEROL 0.5-2.5 (3) MG/3ML IN SOLN
RESPIRATORY_TRACT | Status: AC
  Filled 2024-09-10: qty 3

## 2024-09-10 MED ORDER — METHOCARBAMOL 500 MG PO TABS
750.0000 mg | ORAL_TABLET | Freq: Three times a day (TID) | ORAL | Status: DC | PRN
  Administered 2024-09-10 – 2024-09-11 (×2): 750 mg via ORAL
  Filled 2024-09-10 (×2): qty 2

## 2024-09-10 MED ORDER — FLUTICASONE PROPIONATE 50 MCG/ACT NA SUSP
2.0000 | Freq: Every day | NASAL | Status: DC
  Administered 2024-09-11 – 2024-09-14 (×4): 2 via NASAL
  Filled 2024-09-10: qty 16

## 2024-09-10 MED ORDER — ACETAMINOPHEN 650 MG RE SUPP: 650.0000 mg | Freq: Four times a day (QID) | RECTAL | Status: DC | PRN

## 2024-09-10 MED ORDER — IPRATROPIUM-ALBUTEROL 0.5-2.5 (3) MG/3ML IN SOLN
3.0000 mL | Freq: Once | RESPIRATORY_TRACT | Status: AC
  Administered 2024-09-10: 3 mL via RESPIRATORY_TRACT

## 2024-09-10 MED ORDER — MAGNESIUM SULFATE 2 GM/50ML IV SOLN
2.0000 g | INTRAVENOUS | Status: AC
  Administered 2024-09-10: 2 g via INTRAVENOUS
  Filled 2024-09-10: qty 50

## 2024-09-10 MED ORDER — GABAPENTIN 300 MG PO CAPS
300.0000 mg | ORAL_CAPSULE | Freq: Every day | ORAL | Status: DC
  Administered 2024-09-10 – 2024-09-13 (×4): 300 mg via ORAL
  Filled 2024-09-10 (×4): qty 1

## 2024-09-10 MED ORDER — PANTOPRAZOLE SODIUM 40 MG PO TBEC
40.0000 mg | DELAYED_RELEASE_TABLET | Freq: Every day | ORAL | Status: DC
  Administered 2024-09-11 – 2024-09-14 (×4): 40 mg via ORAL
  Filled 2024-09-10 (×4): qty 1

## 2024-09-10 MED ORDER — ENOXAPARIN SODIUM 60 MG/0.6ML IJ SOSY: 60.0000 mg | PREFILLED_SYRINGE | INTRAMUSCULAR | Status: DC

## 2024-09-10 MED ORDER — ATORVASTATIN CALCIUM 40 MG PO TABS
40.0000 mg | ORAL_TABLET | Freq: Every day | ORAL | Status: DC
  Administered 2024-09-10 – 2024-09-13 (×4): 40 mg via ORAL
  Filled 2024-09-10 (×4): qty 1

## 2024-09-10 MED ORDER — OSELTAMIVIR PHOSPHATE 75 MG PO CAPS
75.0000 mg | ORAL_CAPSULE | Freq: Once | ORAL | Status: AC
  Administered 2024-09-10: 75 mg via ORAL
  Filled 2024-09-10: qty 1

## 2024-09-10 MED ORDER — BISACODYL 5 MG PO TBEC: 5.0000 mg | DELAYED_RELEASE_TABLET | Freq: Every day | ORAL | Status: DC | PRN

## 2024-09-10 MED ORDER — ALBUTEROL SULFATE (2.5 MG/3ML) 0.083% IN NEBU
2.5000 mg | INHALATION_SOLUTION | RESPIRATORY_TRACT | Status: DC | PRN
  Administered 2024-09-12 – 2024-09-14 (×3): 2.5 mg via RESPIRATORY_TRACT
  Filled 2024-09-10 (×4): qty 3

## 2024-09-10 MED ORDER — IPRATROPIUM BROMIDE 0.02 % IN SOLN
RESPIRATORY_TRACT | Status: AC
  Administered 2024-09-10: 0.5 mg
  Filled 2024-09-10: qty 5

## 2024-09-10 MED ORDER — AMIODARONE HCL 200 MG PO TABS
100.0000 mg | ORAL_TABLET | Freq: Every day | ORAL | Status: DC
  Administered 2024-09-10 – 2024-09-13 (×4): 100 mg via ORAL
  Filled 2024-09-10 (×4): qty 1

## 2024-09-10 MED ORDER — FLUOXETINE HCL 20 MG PO CAPS
40.0000 mg | ORAL_CAPSULE | Freq: Every day | ORAL | Status: DC
  Administered 2024-09-11 – 2024-09-14 (×4): 40 mg via ORAL
  Filled 2024-09-10 (×4): qty 2

## 2024-09-10 MED ORDER — HYDROCODONE-ACETAMINOPHEN 5-325 MG PO TABS
1.0000 | ORAL_TABLET | ORAL | Status: DC | PRN
  Administered 2024-09-10: 2 via ORAL
  Administered 2024-09-11: 1 via ORAL
  Filled 2024-09-10: qty 2
  Filled 2024-09-10: qty 1

## 2024-09-10 MED ORDER — IPRATROPIUM-ALBUTEROL 0.5-2.5 (3) MG/3ML IN SOLN
3.0000 mL | Freq: Two times a day (BID) | RESPIRATORY_TRACT | Status: DC
  Administered 2024-09-10 – 2024-09-14 (×7): 3 mL via RESPIRATORY_TRACT
  Filled 2024-09-10 (×9): qty 3

## 2024-09-10 MED ORDER — PREDNISONE 20 MG PO TABS
40.0000 mg | ORAL_TABLET | Freq: Every day | ORAL | Status: DC
  Administered 2024-09-11 – 2024-09-13 (×3): 40 mg via ORAL
  Filled 2024-09-10 (×4): qty 2

## 2024-09-10 MED ORDER — LORATADINE 10 MG PO TABS
10.0000 mg | ORAL_TABLET | Freq: Every day | ORAL | Status: DC
  Administered 2024-09-11 – 2024-09-14 (×4): 10 mg via ORAL
  Filled 2024-09-10 (×4): qty 1

## 2024-09-10 NOTE — ED Notes (Signed)
" °   09/10/24 1258  Oxygen Therapy/Pulse Ox  O2 Device Nasal Cannula  SpO2 (!) 88 %  O2 Therapy Oxygen  O2 Flow Rate (L/min) (S)  3 L/min (Applied neb tx. in hopes of improvement, will assess post neb in order to increase FI02.)  FiO2 (%) 32 %    "

## 2024-09-10 NOTE — Assessment & Plan Note (Signed)
 Resume home Prozac , PRN Xanax 

## 2024-09-10 NOTE — Assessment & Plan Note (Signed)
 K 3.3 on admission.  --40 mEq PO K-Cl ordered to replace --Check Mg level --Monitor & replace electrolytes

## 2024-09-10 NOTE — Assessment & Plan Note (Signed)
 Pt has trace edema, elevated proBNP which can be seen in baseline pulmonary hypertension.  No signs of pulmonary edema on CXR.  Seems relative compensated on admission. --Resume home Lasix  PRN or now --Monitor volume status closely & escalate diuresis as needed --Resume Farxiga  --Hold Entresto  pending repeat renal function with AM labs & with soft BP's

## 2024-09-10 NOTE — Assessment & Plan Note (Addendum)
 Influenza A infection Patient requiring 4 L/min nasal cannula O2 on admission. --Wean O2 as tolerated, maintain sats above 92% --Start Tamiflu  75 mg twice daily 5 x 5 days --Continue prednisone  course started outpatient --DuoNebs twice daily and as needed albuterol  nebs --Scheduled Mucinex  and as needed Tussionex for cough --Pulmonary hygiene with I-S and flutter

## 2024-09-10 NOTE — Assessment & Plan Note (Signed)
 Hold entresto  for now- BP's soft with diastolic in 30's-40's.

## 2024-09-10 NOTE — Assessment & Plan Note (Signed)
 Renal function near baseline Cr 1.62 on admission from 1.43 on 12/16. --Hold entresto  for now --Mointor BMP --Avoid nephrotoxins & renally dose meds

## 2024-09-10 NOTE — Progress Notes (Signed)
" °   09/10/24 2322  BiPAP/CPAP/SIPAP  $ Non-Invasive Home Ventilator  Initial  $ Face Mask Medium Yes  BiPAP/CPAP/SIPAP Pt Type Adult  BiPAP/CPAP/SIPAP Resmed  Mask Type Full face mask  Dentures removed? Not applicable  Mask Size Medium  Respiratory Rate 20 breaths/min  Flow Rate 3 lpm  Patient Home Machine No  Patient Home Mask No  Patient Home Tubing No  Auto Titrate Yes  Minimum cmH2O 5 cmH2O  Maximum cmH2O 15 cmH2O  Nasal massage performed Yes  CPAP/SIPAP surface wiped down Yes  Device Plugged into RED Power Outlet Yes  BiPAP/CPAP /SiPAP Vitals  Pulse Rate 63  SpO2 95 %  Bilateral Breath Sounds Diminished;Clear    "

## 2024-09-10 NOTE — ED Notes (Signed)
 She just left with [facility]. I have just given report to Renea, CHARITY FUNDRAISER at [employer].

## 2024-09-10 NOTE — ED Notes (Signed)
 Second chemistry tube to lab per their request at this time.

## 2024-09-10 NOTE — Assessment & Plan Note (Signed)
 Patient will continue tums Ultra

## 2024-09-10 NOTE — Assessment & Plan Note (Signed)
 HR controlled on admission --Continue Eliquis , amiodarone  --Telemetry monitoring

## 2024-09-10 NOTE — ED Notes (Signed)
" °   09/10/24 1413  Oxygen Therapy/Pulse Ox  O2 Device Nasal Cannula  SpO2 97 %  O2 Therapy (S)  Oxygen humidified (Added humidity, pt stated her nose was burning on 3 L [place] prior to humidity.)  O2 Flow Rate (L/min) (S)  4 L/min (Increased to 4 L [place].)  FiO2 (%) 36 %    "

## 2024-09-10 NOTE — ED Triage Notes (Signed)
 Pt c/o low SpO2 at home, increased SOB, productive cough, and fever. Recently tx for pneumonia. Completed abx, was started on prednisone  yest by PCP. Last took Tylenol  at 1000.

## 2024-09-10 NOTE — Assessment & Plan Note (Signed)
 Body mass index is 45.87 kg/m. Complicates overall care and prognosis.  Recommend lifestyle modifications including physical activity and diet for weight loss and overall long-term health.

## 2024-09-10 NOTE — H&P (Addendum)
 " History and Physical    Patient: Kathy [name] FMW:[id number] DOB: 08/21/1951 DOA: 09/10/2024 DOS: the patient was seen and examined on 09/10/2024 PCP: Theophilus Andrews, Tully GRADE, MD   Referring Provider: Lavanda Lesches, PA-C  Telemedicine Provider: Burnard Cunning, DO Patient Location: [facility] Referring Diagnosis: Acute respiratory failure with hypoxia, influenza A+ Patient Name and DOB verified: Kathy [name], 1950-10-19 Patient consented to Telemedicine Evaluation: Yes RN virtual assistant: Velinda Gin, RN Video encounter time and date: 09/10/2024 4:37 PM   Patient coming from: Home  Chief Complaint:  Chief Complaint  Patient presents with   Shortness of Breath   HPI: Kathy [name] is a 73 y.o. female with medical history significant of chronic HFpEF, pulmonary hypertension, CKD, history of paroxysmal A-fib on Eliquis  who presented to [facility] ED for evaluation of respiratory distress today.  Patient reports onset of cough, sore throat and congestion over the past few days.  Last night, she reports feeling feverish and cold spells this morning.  Cough was productive of clear sputum at first that became thicker and yellow in appearance.  When family arrived to patient's home this morning, they found her in respiratory distress with portable pulse ox showing her O2 sat between 79 and 84%.  Husband then transported patient to the ED for evaluation.  Patient denies history of asthma or COPD.  Patient reports being treated for pneumonia as outpatient in mid November, completed course of antibiotics.  She reports having lingering symptoms for a while after that but had improved until this past weekend.  She otherwise denies recent illnesses including abdominal pain, nausea vomiting diarrhea or any UTI symptoms.  ED course: Initial vitals--temp 97.9 F, HR 79, RR 16-21, BP 122/43, SpO2 85% on room air, later 88% on 3 L and improved to the 90s on 4 L/min nasal cannula  O2. Labs obtained including CMP and CBC were notable for sodium 132, potassium 3.3, glucose 211, BUN 33, creatinine 1.62, calcium  8.7, AST 67, ALT 51, hemoglobin 11.6. Viral PCRs positive for influenza A, negative for COVID and RSV.   proBNP 1421 Imaging--chest x-ray showed no acute findings including signs of pneumonia.  Patient was treated in the ED with Multiple nebulizer treatments, 125 mg IV Solu-Medrol , 2 g IV magnesium  sulfate and is being admitted to the hospital for further evaluation and management of acute respiratory failure with hypoxia in the setting of influenza A infection as outlined in detail below.     Review of Systems: As mentioned in the history of present illness. All other systems reviewed and are negative.   Past Medical History:  Diagnosis Date   Adenomatous colon polyp    2012   Anxiety and depression    Arthritis    Osteoarthritis, Dr Hiram   [facility])    skin - left foot excised   Chronic diastolic CHF (congestive heart failure) (HCC)    DDD (degenerative disc disease), cervical    Deaf, right    DVT (deep venous thrombosis) (HCC)    RLE DVT 08/2004, post right knee arthroscopy   Dysrhythmia    A.Fib   Fibromyalgia    Dr Ludie Piety, Rheu   GERD (gastroesophageal reflux disease)    H/O hiatal hernia    Headache(784.0)    PAST HX MILD MIGRAINES - resolved per patient 08/18/20   History of kidney stones    passed stones   HTN (hypertension)    IBS (irritable bowel syndrome)    Internal hemorrhoid  Lumbar radiculopathy    Obesity    OSA on CPAP    Moderate with AHI 20/hr with nocturnal hypoxemia on CPAP   PAF (paroxysmal atrial fibrillation) (HCC) 06/2018   Pulmonary hypertension (HCC)    Past Surgical History:  Procedure Laterality Date   ABDOMINAL HYSTERECTOMY     dysfunctional menses   APPENDECTOMY     BREAST EXCISIONAL BIOPSY Right    CARDIAC CATHETERIZATION  06/26/2018   CARDIOVERSION N/A 10/10/2022   Procedure:  CARDIOVERSION;  Surgeon: Gardenia Led, DO;  Location: [facility];  Service: Cardiovascular;  Laterality: N/A;   CHOLECYSTECTOMY     COLONOSCOPY  05/27/2002, 08/08/11   2003 diverticulosis, hemorrhoids 2012 same + cecal polyp   EYE SURGERY     KNEE ARTHROSCOPY  2008   right   KYPHOPLASTY Bilateral 08/19/2020   Procedure: KYPHOPLASTY LUMBAR ONE;  Surgeon: Debby Dorn MATSU, MD;  Location: [facility] OR;  Service: Neurosurgery;  Laterality: Bilateral;  posterior   KYPHOPLASTY N/A 07/31/2023   Procedure: KYPHOPLASTY LUMBAR FOUR;  Surgeon: Debby Dorn MATSU, MD;  Location: [facility] OR;  Service: Neurosurgery;  Laterality: N/A;   LEFT HEART CATH AND CORONARY ANGIOGRAPHY N/A 06/26/2018   Procedure: LEFT HEART CATH AND CORONARY ANGIOGRAPHY;  Surgeon: Darron Deatrice LABOR, MD;  Location: [facility] INVASIVE CV LAB;  Service: Cardiovascular;  Laterality: N/A;   MELANOMA EXCISION Left 07/2013   foot   RIGHT HEART CATH N/A 10/10/2022   Procedure: RIGHT HEART CATH;  Surgeon: Gardenia Led, DO;  Location: [facility] INVASIVE CV LAB;  Service: Cardiovascular;  Laterality: N/A;   TEE WITHOUT CARDIOVERSION N/A 10/10/2022   Procedure: TRANSESOPHAGEAL ECHOCARDIOGRAM (TEE);  Surgeon: Gardenia Led, DO;  Location: [facility];  Service: Cardiovascular;  Laterality: N/A;   TOTAL HIP ARTHROPLASTY  02/2010   left ; Dr Hiram   TOTAL HIP ARTHROPLASTY  03/18/2012   Procedure: TOTAL HIP ARTHROPLASTY;  Surgeon: Dempsey LULLA Moan, MD;  Location: [facility] ORS;  Service: Orthopedics;  Laterality: Right;   UPPER GASTROINTESTINAL ENDOSCOPY  04/21/2009, 08/08/11   2010 gastritis ;2012 small hiatal hernia. Dr Avram   WRIST FRACTURE SURGERY Left    Social History:  reports that she has never smoked. She has never used smokeless tobacco. She reports current alcohol  use. She reports that she does not use drugs.  Allergies[1]  Family History  Problem Relation Age of Onset   Heart attack Mother 32   COPD Mother    Breast cancer Mother 60    Hypertension Father    Diabetes Father    Heart failure Father 57   Breast cancer Paternal Aunt        cns mets   COPD Maternal Grandmother    Heart disease Paternal Grandmother        died 2   Diabetes Paternal Grandmother    Stroke Paternal Grandfather        in late 6s   Heart attack Brother 71   Colon cancer Neg Hx     Prior to Admission medications  Medication Sig Start Date End Date Taking? Authorizing Provider  ALPRAZolam  (XANAX ) 1 MG tablet TAKE 1 TABLET BY MOUTH THREE TIMES A DAY AS NEEDED FOR ANXIETY 08/19/24  Yes Theophilus Andrews, Tully GRADE, MD  amiodarone  (PACERONE ) 100 MG tablet Take 1 tablet (100 mg total) by mouth daily. 08/18/24  Yes Milford, Harlene HERO, FNP  apixaban  (ELIQUIS ) 5 MG TABS tablet Take 1 tablet (5 mg total) by mouth 2 (two) times daily. 06/23/24  Yes Sabharwal, Aditya, DO  atorvastatin  (LIPITOR) 40 MG tablet Take 1 tablet (40 mg total) by mouth daily. 05/14/24  Yes Sabharwal, Aditya, DO  Calcium  Carb-Cholecalciferol (CALCIUM  600 + D PO) Take 600 mg by mouth daily.   Yes [provider]  cetirizine  (ZYRTEC ) 10 MG tablet TAKE 1 TABLET BY MOUTH EVERY DAY 04/03/24  Yes Theophilus Andrews, Tully GRADE, MD  chlorpheniramine-HYDROcodone  (TUSSIONEX) 10-8 MG/5ML Take 5 mLs by mouth at bedtime as needed. 09/02/24  Yes Theophilus Andrews, Tully GRADE, MD  clindamycin (CLEOCIN T) 1 % external solution Apply 1 Application topically 2 (two) times daily. 08/12/19  Yes [provider]  clindamycin (CLEOCIN T) 1 % lotion Apply 1 applicator topically 2 (two) times daily as needed.   Yes [provider]  cyclobenzaprine  (FLEXERIL ) 5 MG tablet TAKE 1 TABLET BY MOUTH AT BEDTIME AS NEEDED FOR MUSCLE SPASMS. 07/12/23  Yes Theophilus Andrews, Tully GRADE, MD  fluocinonide (LIDEX) 0.05 % external solution Apply 1 application topically daily as needed (rosacea).   Yes [provider]  FLUoxetine  (PROZAC ) 40 MG capsule TAKE 1 CAPSULE BY MOUTH EVERY DAY 12/24/23  Yes  Theophilus Andrews, Tully GRADE, MD  fluticasone  (FLONASE ) 50 MCG/ACT nasal spray SPRAY 2 SPRAYS INTO EACH NOSTRIL EVERY DAY 05/20/24  Yes Theophilus Andrews, Tully GRADE, MD  furosemide  (LASIX ) 40 MG tablet Take 1 tablet (40 mg total) by mouth as needed. For weight gain of 3 lbs in 24 hours or 5 lbs in a week 08/12/24  Yes Milford, [place], FNP  gabapentin  (NEURONTIN ) 300 MG capsule TAKE 1 CAPSULE BY MOUTH EVERYDAY AT BEDTIME 05/12/24  Yes Theophilus Andrews, Tully GRADE, MD  methocarbamol  (ROBAXIN ) 750 MG tablet Take 750 mg by mouth 3 (three) times daily as needed for muscle spasms.   Yes [provider]  metroNIDAZOLE  (METROCREAM ) 0.75 % cream Apply 1 application topically 2 (two) times daily as needed (rosacea).    Yes [provider]  Multiple Vitamin (MULTIVITAMIN WITH MINERALS) TABS tablet Take 1 tablet by mouth daily. Centrum Silver   Yes [provider]  omeprazole  (PRILOSEC) 40 MG capsule TAKE 1 CAPSULE BY MOUTH EVERY DAY 03/20/24  Yes Theophilus Andrews, Tully GRADE, MD  predniSONE  (DELTASONE ) 20 MG tablet Take 2 tablets (40 mg total) by mouth daily with breakfast for 5 days. 09/09/24 09/14/24 Yes Jordan, Betty G, MD  Propylene Glycol (SYSTANE BALANCE) 0.6 % SOLN Place 1 drop into both eyes daily as needed (Dry eyes).   Yes [provider]  sacubitril -valsartan  (ENTRESTO ) 97-103 MG Take 1 tablet by mouth 2 (two) times daily. 08/13/24  Yes Clegg, Amy D, NP  albuterol  (VENTOLIN  HFA) 108 (90 Base) MCG/ACT inhaler Inhale 2 puffs into the lungs every 6 (six) hours as needed for wheezing or shortness of breath. 09/09/24   Jordan, Betty G, MD  azelastine  (ASTELIN ) 0.1 % nasal spray Place 2 sprays into both nostrils 2 (two) times daily. Use in each nostril as directed Patient not taking: Reported on 09/10/2024 06/12/24   Tobie Eldora NOVAK, MD  dapagliflozin  propanediol (FARXIGA ) 10 MG TABS tablet Take 1 tablet (10 mg total) by mouth daily. 08/26/24   Colletta Manuelita Garre, PA-C   nitroGLYCERIN  (NITROSTAT ) 0.4 MG SL tablet Place 1 tablet (0.4 mg total) under the tongue every 5 (five) minutes as needed for chest pain. 09/02/24 12/01/24  Theophilus Andrews, Tully GRADE, MD  potassium chloride  (KLOR-CON ) 10 MEQ tablet Take 2 tablets (20 mEq total) by mouth as needed. For weight gain of 3 lbs in 24 hours  or 5 lbs in a week Patient not taking: Reported on 09/10/2024 08/12/24   Glena Harlene HERO, FNP    Physical Exam: Vitals:   09/10/24 1710 09/10/24 1730 09/10/24 1822 09/10/24 1826  BP:  (!) 108/42  (!) 104/49  Pulse:    69  Resp:  16  16  Temp: 97.9 F (36.6 C)   97.9 F (36.6 C)  TempSrc:    Oral  SpO2:  94%  98%  Weight:   128.9 kg   Height:   5' 6 (1.676 m)    Bedside physical exam was performed by RN listed above. Below exam findings are based on their in person physical exam findings and my observations during virtual encounter.  General exam: awake, alert, no acute distress, obese HEENT: Voice sounds hoarse, hearing grossly normal  Respiratory system: Expiratory wheezes in all lung fields more prominent posteriorly, normal respiratory effort at rest, on 4 L/min nasal cannula O2 Cardiovascular system: normal S1/S2, RRR Gastrointestinal system: soft, NT, ND, +bowel sounds. Central nervous system: A&O x3. no gross focal neurologic deficits, normal speech Extremities: Trace plus bilateral lower extremity edema Skin: RN reports skin dry and normal temperature Psychiatry: normal mood, congruent affect, judgement and insight appear normal   Data Reviewed:  As reviewed in detail above  Assessment and Plan:  * Acute respiratory failure with hypoxia (HCC) Influenza A infection Patient requiring 4 L/min nasal cannula O2 on admission. --Wean O2 as tolerated, maintain sats above 92% --Start Tamiflu  75 mg twice daily 5 x 5 days --Continue prednisone  course started outpatient --DuoNebs twice daily and as needed albuterol  nebs --Scheduled Mucinex  and as needed  Tussionex for cough --Pulmonary hygiene with I-S and flutter  Hypokalemia K 3.3 on admission.  --40 mEq PO K-Cl ordered to replace --Check Mg level --Monitor & replace electrolytes  CKD (chronic kidney disease) stage 3, GFR 30-59 ml/min (HCC) Renal function near baseline Cr 1.62 on admission from 1.43 on 12/16. --Hold entresto  for now --Mointor BMP --Avoid nephrotoxins & renally dose meds   Chronic diastolic CHF (congestive heart failure) (HCC) Pt has trace edema, elevated proBNP which can be seen in baseline pulmonary hypertension.  No signs of pulmonary edema on CXR.  Seems relative compensated on admission. --Resume home Lasix  PRN or now --Monitor volume status closely & escalate diuresis as needed --Resume Farxiga  --Hold Entresto  pending repeat renal function with AM labs & with soft BP's  Paroxysmal atrial fibrillation (HCC) HR controlled on admission --Continue Eliquis , amiodarone  --Telemetry monitoring  Morbid obesity (HCC) Body mass index is 45.87 kg/m. Complicates overall care and prognosis.  Recommend lifestyle modifications including physical activity and diet for weight loss and overall long-term health.  Anxiety and depression Resume home Prozac , PRN Xanax   Esophageal reflux Continue PPI  Essential hypertension Hold entresto  for now- BP's soft with diastolic in 30's-40's.        Advance Care Planning: CODE STATUS-full code  Consults: None  Family Communication:   Severity of Illness: The appropriate patient status for this patient is OBSERVATION. Observation status is judged to be reasonable and necessary in order to provide the required intensity of service to ensure the patient's safety. The patient's presenting symptoms, physical exam findings, and initial radiographic and laboratory data in the context of their medical condition is felt to place them at decreased risk for further clinical deterioration. Furthermore, it is anticipated that the  patient will be medically stable for discharge from the hospital within 2 midnights of admission.   Author:  Burnard DELENA Cunning, DO 09/10/2024 6:50 PM  For on call review [email or web address].      [1]  Allergies Allergen Reactions   Achromycin [Tetracycline Hcl] Shortness Of Breath    Dyspnea     Penicillins     Facial angioedema Did it involve swelling of the face/tongue/throat, SOB, or low BP? Yes Did it involve sudden or severe rash/hives, skin peeling, or any reaction on the inside of your mouth or nose? No Did you need to seek medical attention at a hospital or doctor's office? No When did it last happen?      30+ years If all above answers are NO, may proceed with cephalosporin use.    Sulfamethoxazole-Trimethoprim Hives   Nickel Itching   Oxycodone  Itching   Zanaflex [Tizanidine Hydrochloride] Other (See Comments)    Causes more pain    "

## 2024-09-10 NOTE — Plan of Care (Signed)
 Plan of Care Note for accepted transfer   Patient: Kathy [name] MRN: [id number]   DOA: 09/10/2024  Facility requesting transfer: [facility]  Requesting Provider: Lavanda Lesches, PA-C  Reason for transfer: Acute Respiratory Failure with Hypoxia - Influenza A  Facility course: The patient is a morbidly obese Caucasian female with past medical history significant for Mannam to CHF, pulmonary hypertension, CKD, history of A-fib on anticoagulation presents with respiratory distress.  Started developing flulike symptoms yesterday went to see her PCP and was prescribed prednisone .  Family found her this morning with increased work of breathing and O2 saturations done at home which showed that she was saturating between 79 to 84% on room air.  Upon arrival to the ED she was 85% on room air was tachypneic and had increased work of breathing with a fever of 100.8.  Not able to speak in full sentences.  Advised on supplemental oxygen via nasal cannula was given DuoNebs and Solu-Medrol .  Being transferred to [facility] for further management of acute respiratory failure with hypoxia the setting of influenza A.  Requested the EDP to give the patient Tamiflu .  Plan of care: The patient is accepted for observation to Progressive unit, at [facility]   Author: Alejandro Marker, DO [facility]  09/10/2024  Check [email or web address] for on-call coverage.  Nursing staff, Please call [facility] Admits & Consults System-Wide number on Amion as soon as patient's arrival, so appropriate admitting provider can evaluate the pt.

## 2024-09-10 NOTE — ED Notes (Signed)
 Kathy [name] with cl called for transport

## 2024-09-10 NOTE — ED Provider Notes (Signed)
 " [place] [facility] AT [facility] Provider Note   CSN: [id number] Arrival date & time: 09/10/24  1239     Patient presents with: Shortness of Breath   Kathy [name] is a 73 y.o. female with a past medical history of CHF, obesity, sleep apnea on CPAP, CKD, malignant melanoma, hypertension, GERD, chronic anticoagulation on Eliquis  for paroxysmal atrial fibrillation and pulmonary hypertension.  Patient and husband give the history.  He reports that she was diagnosed with pneumonia back around mid November she completed a course of antibiotics he states that although she improved she never fully got better and has had a little bit of a lingering cough since that time.  Patient reports that yesterday she began having cough body aches chills and fever.  She was seen by her primary care doctor and started on steroids and sent home.  Apparently family was coming over to the house today to start festivities for the holiday and found her in significant respiratory distress.  She was feeling dizzy tachypneic and having difficulty breathing.  Her husband has a portable pulse ox at home and states that her O2 saturation between 79 and 84% so he had her transported to the emergency department.  She denies unilateral leg swelling or pain she is compliant with her anticoagulation.  She has a history of a DVT.  She denies a history of asthma.  She has not had any significant weight gain or swelling in her lower extremities suggestive of fluid accumulation.  She took Tylenol  and 40 mg of prednisone  prior to arrival.    Shortness of Breath      Prior to Admission medications  Medication Sig Start Date End Date Taking? Authorizing Provider  ALPRAZolam  (XANAX ) 1 MG tablet TAKE 1 TABLET BY MOUTH THREE TIMES A DAY AS NEEDED FOR ANXIETY 08/19/24  Yes Theophilus Andrews, Tully GRADE, MD  amiodarone  (PACERONE ) 100 MG tablet Take 1 tablet (100 mg total) by mouth daily. 08/18/24  Yes [place],  Harlene HERO, FNP  apixaban  (ELIQUIS ) 5 MG TABS tablet Take 1 tablet (5 mg total) by mouth 2 (two) times daily. 06/23/24  Yes Sabharwal, Aditya, DO  atorvastatin  (LIPITOR) 40 MG tablet Take 1 tablet (40 mg total) by mouth daily. 05/14/24  Yes Sabharwal, Aditya, DO  Calcium  Carb-Cholecalciferol (CALCIUM  600 + D PO) Take 600 mg by mouth daily.   Yes [provider]  cetirizine  (ZYRTEC ) 10 MG tablet TAKE 1 TABLET BY MOUTH EVERY DAY 04/03/24  Yes Theophilus Andrews, Tully GRADE, MD  chlorpheniramine-HYDROcodone  (TUSSIONEX) 10-8 MG/5ML Take 5 mLs by mouth at bedtime as needed. 09/02/24  Yes Theophilus Andrews, Tully GRADE, MD  clindamycin (CLEOCIN T) 1 % external solution Apply 1 Application topically 2 (two) times daily. 08/12/19  Yes [provider]  clindamycin (CLEOCIN T) 1 % lotion Apply 1 applicator topically 2 (two) times daily as needed.   Yes [provider]  cyclobenzaprine  (FLEXERIL ) 5 MG tablet TAKE 1 TABLET BY MOUTH AT BEDTIME AS NEEDED FOR MUSCLE SPASMS. 07/12/23  Yes Theophilus Andrews, Tully GRADE, MD  fluocinonide (LIDEX) 0.05 % external solution Apply 1 application topically daily as needed (rosacea).   Yes [provider]  FLUoxetine  (PROZAC ) 40 MG capsule TAKE 1 CAPSULE BY MOUTH EVERY DAY 12/24/23  Yes Theophilus Andrews, Tully GRADE, MD  fluticasone  (FLONASE ) 50 MCG/ACT nasal spray SPRAY 2 SPRAYS INTO EACH NOSTRIL EVERY DAY 05/20/24  Yes Theophilus Andrews, Tully GRADE, MD  furosemide  (LASIX ) 40 MG tablet Take 1 tablet (  40 mg total) by mouth as needed. For weight gain of 3 lbs in 24 hours or 5 lbs in a week 08/12/24  Yes [place], [place], FNP  gabapentin  (NEURONTIN ) 300 MG capsule TAKE 1 CAPSULE BY MOUTH EVERYDAY AT BEDTIME 05/12/24  Yes Theophilus Andrews, Tully GRADE, MD  methocarbamol  (ROBAXIN ) 750 MG tablet Take 750 mg by mouth 3 (three) times daily as needed for muscle spasms.   Yes [provider]  metroNIDAZOLE  (METROCREAM ) 0.75 % cream Apply 1 application topically 2  (two) times daily as needed (rosacea).    Yes [provider]  Multiple Vitamin (MULTIVITAMIN WITH MINERALS) TABS tablet Take 1 tablet by mouth daily. Centrum Silver   Yes [provider]  omeprazole  (PRILOSEC) 40 MG capsule TAKE 1 CAPSULE BY MOUTH EVERY DAY 03/20/24  Yes Theophilus Andrews, Tully GRADE, MD  predniSONE  (DELTASONE ) 20 MG tablet Take 2 tablets (40 mg total) by mouth daily with breakfast for 5 days. 09/09/24 09/14/24 Yes Jordan, Betty G, MD  Propylene Glycol (SYSTANE BALANCE) 0.6 % SOLN Place 1 drop into both eyes daily as needed (Dry eyes).   Yes [provider]  sacubitril -valsartan  (ENTRESTO ) 97-103 MG Take 1 tablet by mouth 2 (two) times daily. 08/13/24  Yes Clegg, Amy D, NP  albuterol  (VENTOLIN  HFA) 108 (90 Base) MCG/ACT inhaler Inhale 2 puffs into the lungs every 6 (six) hours as needed for wheezing or shortness of breath. 09/09/24   Jordan, Betty G, MD  azelastine  (ASTELIN ) 0.1 % nasal spray Place 2 sprays into both nostrils 2 (two) times daily. Use in each nostril as directed Patient not taking: Reported on 09/10/2024 06/12/24   Tobie Eldora NOVAK, MD  dapagliflozin  propanediol (FARXIGA ) 10 MG TABS tablet Take 1 tablet (10 mg total) by mouth daily. 08/26/24   Colletta Manuelita Garre, PA-C  nitroGLYCERIN  (NITROSTAT ) 0.4 MG SL tablet Place 1 tablet (0.4 mg total) under the tongue every 5 (five) minutes as needed for chest pain. 09/02/24 12/01/24  Theophilus Andrews, Tully GRADE, MD  potassium chloride  (KLOR-CON ) 10 MEQ tablet Take 2 tablets (20 mEq total) by mouth as needed. For weight gain of 3 lbs in 24 hours or 5 lbs in a week Patient not taking: Reported on 09/10/2024 08/12/24   Glena Harlene HERO, FNP    Allergies: Achromycin [tetracycline hcl], Penicillins, Sulfamethoxazole-trimethoprim, Nickel, Oxycodone , and Zanaflex [tizanidine hydrochloride]    Review of Systems  Respiratory:  Positive for shortness of breath.     Updated Vital Signs BP (!) 109/50 (BP  Location: Right Arm)   Pulse 65   Temp 98.8 F (37.1 C) (Oral)   Resp 16   Ht 5' 6 (1.676 m)   Wt 126.8 kg   SpO2 93%   BMI 45.12 kg/m   Physical Exam Vitals and nursing note reviewed.  Constitutional:      General: She is not in acute distress.    Appearance: She is well-developed. She is obese. She is not diaphoretic.  HENT:     Head: Normocephalic and atraumatic.     Right Ear: External ear normal.     Left Ear: External ear normal.     Nose: Nose normal.     Mouth/Throat:     Mouth: Mucous membranes are moist.  Eyes:     General: No scleral icterus.    Conjunctiva/sclera: Conjunctivae normal.  Cardiovascular:     Rate and Rhythm: Normal rate and regular rhythm.     Heart sounds: Normal heart sounds. No murmur heard.  No friction rub. No gallop.  Pulmonary:     Effort: Tachypnea, prolonged expiration and respiratory distress present.     Breath sounds: Decreased air movement present. Decreased breath sounds and wheezing present.  Abdominal:     General: Bowel sounds are normal. There is no distension.     Palpations: Abdomen is soft. There is no mass.     Tenderness: There is no abdominal tenderness. There is no guarding.  Musculoskeletal:     Cervical back: Normal range of motion.  Skin:    General: Skin is warm and dry.  Neurological:     Mental Status: She is alert and oriented to person, place, and time.  Psychiatric:        Behavior: Behavior normal.     (all labs ordered are listed, but only abnormal results are displayed) Labs Reviewed  RESP PANEL BY RT-PCR (RSV, FLU A&B, COVID)  RVPGX2 - Abnormal; Notable for the following components:      Result Value   Influenza A by PCR POSITIVE (*)    All other components within normal limits  CBC WITH DIFFERENTIAL/PLATELET - Abnormal; Notable for the following components:   RBC 3.85 (*)    Hemoglobin 10.3 (*)    HCT 32.2 (*)    RDW 16.6 (*)    All other components within normal limits  COMPREHENSIVE  METABOLIC PANEL WITH GFR - Abnormal; Notable for the following components:   Sodium 132 (*)    Potassium 3.3 (*)    Chloride 94 (*)    Glucose, Bld 211 (*)    BUN 33 (*)    Creatinine, Ser 1.62 (*)    Calcium  8.7 (*)    Total Protein 6.4 (*)    AST 67 (*)    ALT 51 (*)    GFR, Estimated 33 (*)    All other components within normal limits  PRO BRAIN NATRIURETIC PEPTIDE - Abnormal; Notable for the following components:   Pro Brain Natriuretic Peptide 1,421.0 (*)    All other components within normal limits  BASIC METABOLIC PANEL WITH GFR - Abnormal; Notable for the following components:   Sodium 131 (*)    Chloride 96 (*)    Glucose, Bld 137 (*)    BUN 32 (*)    Creatinine, Ser 1.29 (*)    Calcium  8.7 (*)    GFR, Estimated 44 (*)    All other components within normal limits  CBC - Abnormal; Notable for the following components:   RBC 3.60 (*)    Hemoglobin 9.5 (*)    HCT 30.0 (*)    RDW 16.2 (*)    All other components within normal limits  MAGNESIUM  - Abnormal; Notable for the following components:   Magnesium  2.6 (*)    All other components within normal limits  I-STAT VENOUS BLOOD GAS, ED - Abnormal; Notable for the following components:   Sodium 133 (*)    Calcium , Ion 1.12 (*)    HCT 34.0 (*)    Hemoglobin 11.6 (*)    All other components within normal limits  CULTURE, BLOOD (ROUTINE X 2)  CULTURE, BLOOD (ROUTINE X 2)  COMPREHENSIVE METABOLIC PANEL WITH GFR  MAGNESIUM     EKG: EKG Interpretation Date/Time:  Wednesday September 10 2024 13:39:17 EST Ventricular Rate:  71 PR Interval:  177 QRS Duration:  113 QT Interval:  485 QTC Calculation: 528 R Axis:   50  Text Interpretation: Sinus rhythm Borderline intraventricular conduction delay Low voltage, precordial leads Borderline  T wave abnormalities Prolonged QT interval Confirmed by Levander Houston [id number]) on 09/10/2024 1:44:54 PM  Radiology: [facility] Chest Portable 1 View Result Date: 09/10/2024 CLINICAL DATA:   Shortness of breath EXAM: PORTABLE CHEST 1 VIEW COMPARISON:  Yesterday FINDINGS: Stable cardiomediastinal silhouette. No acute pulmonary disease is noted. Bony thorax is unremarkable. IMPRESSION: No acute abnormality seen. Electronically Signed   By: Lynwood Landy Raddle M.D.   On: 09/10/2024 15:05     .Critical Care  Performed by: Arloa Chroman, PA-C Authorized by: Arloa Chroman, PA-C   Critical care provider statement:    Critical care time (minutes):  75   Critical care time was exclusive of:  Separately billable procedures and treating other patients and teaching time   Critical care was necessary to treat or prevent imminent or life-threatening deterioration of the following conditions:  Respiratory failure   Critical care was time spent personally by me on the following activities:  Development of treatment plan with patient or surrogate, discussions with consultants, evaluation of patient's response to treatment, examination of patient, ordering and review of laboratory studies, ordering and review of radiographic studies, ordering and performing treatments and interventions, pulse oximetry, re-evaluation of patient's condition, review of old charts, obtaining history from patient or surrogate and interpretation of cardiac output measurements    Medications Ordered in the ED  ipratropium-albuterol  (DUONEB) 0.5-2.5 (3) MG/3ML nebulizer solution (  Canceled Entry 09/10/24 1315)  acetaminophen  (TYLENOL ) tablet 650 mg (650 mg Oral Given 09/11/24 1026)    Or  acetaminophen  (TYLENOL ) suppository 650 mg ( Rectal See Alternative 09/11/24 1026)  HYDROcodone -acetaminophen  (NORCO/VICODIN) 5-325 MG per tablet 1-2 tablet (2 tablets Oral Given 09/10/24 2121)  polyethylene glycol (MIRALAX  / GLYCOLAX ) packet 17 g (has no administration in time range)  bisacodyl  (DULCOLAX) EC tablet 5 mg (has no administration in time range)  ipratropium-albuterol  (DUONEB) 0.5-2.5 (3) MG/3ML nebulizer solution 3 mL (3  mLs Nebulization Given 09/11/24 0909)  albuterol  (PROVENTIL ) (2.5 MG/3ML) 0.083% nebulizer solution 2.5 mg (has no administration in time range)  oseltamivir  (TAMIFLU ) capsule 30 mg (30 mg Oral Given 09/11/24 1026)  dextromethorphan -guaiFENesin  (MUCINEX  DM) 30-600 MG per 12 hr tablet 1 tablet (1 tablet Oral Given 09/11/24 1026)  chlorpheniramine-HYDROcodone  (TUSSIONEX) 10-8 MG/5ML suspension 5 mL (5 mLs Oral Given 09/11/24 1205)  amiodarone  (PACERONE ) tablet 100 mg (100 mg Oral Given 09/10/24 2121)  ALPRAZolam  (XANAX ) tablet 1 mg (1 mg Oral Given 09/10/24 2121)  apixaban  (ELIQUIS ) tablet 5 mg (5 mg Oral Given 09/11/24 1026)  atorvastatin  (LIPITOR) tablet 40 mg (40 mg Oral Given 09/10/24 2122)  FLUoxetine  (PROZAC ) capsule 40 mg (40 mg Oral Given 09/11/24 1026)  loratadine  (CLARITIN ) tablet 10 mg (10 mg Oral Given 09/11/24 1026)  fluticasone  (FLONASE ) 50 MCG/ACT nasal spray 2 spray (2 sprays Each Nare Given 09/11/24 1025)  furosemide  (LASIX ) tablet 40 mg (has no administration in time range)  gabapentin  (NEURONTIN ) capsule 300 mg (300 mg Oral Given 09/10/24 2121)  methocarbamol  (ROBAXIN ) tablet 750 mg (750 mg Oral Given 09/10/24 2122)  pantoprazole  (PROTONIX ) EC tablet 40 mg (40 mg Oral Given 09/11/24 1026)  predniSONE  (DELTASONE ) tablet 40 mg (40 mg Oral Given 09/11/24 0900)  ipratropium-albuterol  (DUONEB) 0.5-2.5 (3) MG/3ML nebulizer solution 3 mL (3 mLs Nebulization Given 09/10/24 1258)  albuterol  (PROVENTIL ) (2.5 MG/3ML) 0.083% nebulizer solution (2.5 mg  Given 09/10/24 1323)  ipratropium (ATROVENT ) 0.02 % nebulizer solution (0.5 mg  Given 09/10/24 1323)  methylPREDNISolone  sodium succinate (SOLU-MEDROL ) 125 mg/2 mL injection 125 mg (125 mg Intravenous Given 09/10/24 1351)  albuterol  (PROVENTIL ) (2.5 MG/3ML) 0.083% nebulizer solution (2.5 mg  Given 09/10/24 1323)  ipratropium (ATROVENT ) 0.02 % nebulizer solution (0.5 mg  Given 09/10/24 1323)  magnesium  sulfate IVPB 2 g 50 mL (0 g Intravenous  Stopped 09/10/24 1510)  oseltamivir  (TAMIFLU ) capsule 75 mg (75 mg Oral Given 09/10/24 2123)  potassium chloride  SA (KLOR-CON  M) CR tablet 40 mEq (40 mEq Oral Given 09/10/24 1852)    Clinical Course as of 09/11/24 1835  Wed Sep 10, 2024  1449 Seen in shared visit with attending physician [AH]  1502 Influenza A By PCR(!): POSITIVE [AH]  1519 WBC: 4.8 [AH]  1519 Influenza A By PCR(!): POSITIVE [AH]    Clinical Course User Index [AH] Arloa Chroman, PA-C                                 Medical Decision Making Amount and/or Complexity of Data Reviewed Labs: ordered. Decision-making details documented in ED Course. Radiology: ordered.  Risk Prescription drug management. Decision regarding hospitalization.   Patient seen in shared visit with attending physician. Who agrees with assessment, work up , treatment, and plan for admission  This is a 73 year old female who presents to the emergency department with chief complaint of shortness of breath and hypoxia. The emergent differential diagnosis for shortness of breath includes, but is not limited to, Pulmonary edema, bronchoconstriction, Pneumonia, Pulmonary embolism, Pneumotherax/ Hemothorax, Dysrythmia, ACS.   She has comorbidities that include obesity, heart failure, pulmonary hypertension, obstructive sleep apnea.  Recent URI symptoms.  History collected from the patient, her husband at bedside review of EMR.  I ordered and interpreted labs: Patient baseline creatinine 1.62.  Potassium slightly low at 3.3.  Glucose 211 in the setting of illness.  Mildly elevated AST and ALT of insignificant value at this time. CBC without elevated white blood cell count hemoglobin 10.3. Patient is positive for the flu.  VBG unremarkable.  proBNP only slightly elevated at 1400.  I visualized and interpreted 1 view chest x-ray which shows no acute abnormalities.   Patient treated for hypoxic respiratory failure with multiple rounds of  albuterol .  Magnesium , IV steroids.  She improved somewhat but still required oxygen supplementation and will require admission for acute hypoxic respiratory failure likely due to rhonchus spasm and wheezing in the setting of influenza A.      Final diagnoses:  Influenza A  Acute respiratory failure with hypoxia [facility])    ED Discharge Orders     None          Arloa Chroman, PA-C 09/11/24 1847    Levander Houston, MD 09/15/24 1226  "

## 2024-09-11 ENCOUNTER — Encounter (HOSPITAL_COMMUNITY): Payer: Self-pay | Admitting: Internal Medicine

## 2024-09-11 DIAGNOSIS — F32A Depression, unspecified: Secondary | ICD-10-CM | POA: Diagnosis present

## 2024-09-11 DIAGNOSIS — J9601 Acute respiratory failure with hypoxia: Secondary | ICD-10-CM

## 2024-09-11 DIAGNOSIS — R7401 Elevation of levels of liver transaminase levels: Secondary | ICD-10-CM | POA: Diagnosis present

## 2024-09-11 DIAGNOSIS — E876 Hypokalemia: Secondary | ICD-10-CM | POA: Diagnosis present

## 2024-09-11 DIAGNOSIS — Z7901 Long term (current) use of anticoagulants: Secondary | ICD-10-CM | POA: Diagnosis not present

## 2024-09-11 DIAGNOSIS — J101 Influenza due to other identified influenza virus with other respiratory manifestations: Secondary | ICD-10-CM | POA: Diagnosis present

## 2024-09-11 DIAGNOSIS — I5032 Chronic diastolic (congestive) heart failure: Secondary | ICD-10-CM | POA: Diagnosis present

## 2024-09-11 DIAGNOSIS — Z6841 Body Mass Index (BMI) 40.0 and over, adult: Secondary | ICD-10-CM | POA: Diagnosis not present

## 2024-09-11 DIAGNOSIS — Z79899 Other long term (current) drug therapy: Secondary | ICD-10-CM | POA: Diagnosis not present

## 2024-09-11 DIAGNOSIS — R5381 Other malaise: Secondary | ICD-10-CM | POA: Diagnosis present

## 2024-09-11 DIAGNOSIS — Z85820 Personal history of malignant melanoma of skin: Secondary | ICD-10-CM | POA: Diagnosis not present

## 2024-09-11 DIAGNOSIS — R531 Weakness: Secondary | ICD-10-CM | POA: Diagnosis present

## 2024-09-11 DIAGNOSIS — Z1152 Encounter for screening for COVID-19: Secondary | ICD-10-CM | POA: Diagnosis not present

## 2024-09-11 DIAGNOSIS — E785 Hyperlipidemia, unspecified: Secondary | ICD-10-CM | POA: Diagnosis present

## 2024-09-11 DIAGNOSIS — N1832 Chronic kidney disease, stage 3b: Secondary | ICD-10-CM | POA: Diagnosis present

## 2024-09-11 DIAGNOSIS — F419 Anxiety disorder, unspecified: Secondary | ICD-10-CM | POA: Diagnosis present

## 2024-09-11 DIAGNOSIS — I130 Hypertensive heart and chronic kidney disease with heart failure and stage 1 through stage 4 chronic kidney disease, or unspecified chronic kidney disease: Secondary | ICD-10-CM | POA: Diagnosis present

## 2024-09-11 DIAGNOSIS — M797 Fibromyalgia: Secondary | ICD-10-CM | POA: Diagnosis present

## 2024-09-11 DIAGNOSIS — K219 Gastro-esophageal reflux disease without esophagitis: Secondary | ICD-10-CM | POA: Diagnosis present

## 2024-09-11 DIAGNOSIS — Z833 Family history of diabetes mellitus: Secondary | ICD-10-CM | POA: Diagnosis not present

## 2024-09-11 DIAGNOSIS — I480 Paroxysmal atrial fibrillation: Secondary | ICD-10-CM | POA: Diagnosis present

## 2024-09-11 DIAGNOSIS — Z8249 Family history of ischemic heart disease and other diseases of the circulatory system: Secondary | ICD-10-CM | POA: Diagnosis not present

## 2024-09-11 DIAGNOSIS — E66813 Obesity, class 3: Secondary | ICD-10-CM | POA: Diagnosis present

## 2024-09-11 DIAGNOSIS — J9600 Acute respiratory failure, unspecified whether with hypoxia or hypercapnia: Secondary | ICD-10-CM | POA: Diagnosis present

## 2024-09-11 DIAGNOSIS — I272 Pulmonary hypertension, unspecified: Secondary | ICD-10-CM | POA: Diagnosis present

## 2024-09-11 LAB — BASIC METABOLIC PANEL WITH GFR
Anion gap: 11 (ref 5–15)
BUN: 32 mg/dL — ABNORMAL HIGH (ref 8–23)
CO2: 25 mmol/L (ref 22–32)
Calcium: 8.7 mg/dL — ABNORMAL LOW (ref 8.9–10.3)
Chloride: 96 mmol/L — ABNORMAL LOW (ref 98–111)
Creatinine, Ser: 1.29 mg/dL — ABNORMAL HIGH (ref 0.44–1.00)
GFR, Estimated: 44 mL/min — ABNORMAL LOW
Glucose, Bld: 137 mg/dL — ABNORMAL HIGH (ref 70–99)
Potassium: 4.6 mmol/L (ref 3.5–5.1)
Sodium: 131 mmol/L — ABNORMAL LOW (ref 135–145)

## 2024-09-11 LAB — CBC
HCT: 30 % — ABNORMAL LOW (ref 36.0–46.0)
Hemoglobin: 9.5 g/dL — ABNORMAL LOW (ref 12.0–15.0)
MCH: 26.4 pg (ref 26.0–34.0)
MCHC: 31.7 g/dL (ref 30.0–36.0)
MCV: 83.3 fL (ref 80.0–100.0)
Platelets: 171 K/uL (ref 150–400)
RBC: 3.6 MIL/uL — ABNORMAL LOW (ref 3.87–5.11)
RDW: 16.2 % — ABNORMAL HIGH (ref 11.5–15.5)
WBC: 4.2 K/uL (ref 4.0–10.5)
nRBC: 0 % (ref 0.0–0.2)

## 2024-09-11 LAB — MAGNESIUM: Magnesium: 2.6 mg/dL — ABNORMAL HIGH (ref 1.7–2.4)

## 2024-09-11 NOTE — Plan of Care (Signed)
 Patient admitted by Dr. Fausto for hypoxic respiratory failure in setting of of influenza.  Patient was evaluated bedside and resting comfortably.  Will continue supplemental oxygen and continue to monitor.

## 2024-09-11 NOTE — Progress Notes (Signed)
 " PROGRESS NOTE    Kathy [name]  FMW:[id number] DOB: 1951-01-22 DOA: 09/10/2024 PCP: Theophilus Andrews, Tully GRADE, MD    Brief Narrative:   Kathy [name] is a 73 y.o. female with past medical history significant for chronic diastolic congestive heart failure, paroxysmal atrial fibrillation on Eliquis , HTN, pulmonary HTN, CKD stage IIIb, anxiety/depression, GERD, obesity who presented to [facility] on 09/10/2024 with progressive shortness of breath, cough, sore throat and congestion.  Ongoing over the last few days.  Also endorses fever and chills.  Cough productive of clear sputum.  When family arrived to patient's home they found her in respiratory distress with portable pulse oximeter with SpO2 between 79-84%.  Husband then transported patient to the ED for further evaluation and management.  No history of asthma or COPD.  In the ED, temperature 100.8 F, HR 71, RR 17, BP 122/43, SpO2 88% on 3 L nasal cannula.  WBC 4.8, hemoglobin 10.3, platelet count 253.  Sodium 132, potassium 3.3, chloride 94, CO2 25, glucose 211, BUN 33, creat 1.62.  VBG with pH 7.356, pCO2 49.8, PaO2 41.  Influenza A PCR positive.  Respiratory syncytial virus/COVID-19 PCR negative.  Chest x-ray with no acute abnormality noted.  Patient received multiple nebulized treatments, IV Solu-Medrol , IV magnesium  in the ED.  TRH was consulted and patient was transferred to [facility] for further evaluation and management.  Assessment & Plan:   Acute respiratory failure with hypoxia, POA Influenza A viral infection Patient presenting to ED after being found hypoxic by family members with SpO2 79-84% at home.  Patient endorses progressive shortness of breath with associated fever/chills, cough over the last 3 days.  Was given multiple nebulizer treatment, IV Solu-Medrol  and requiring 4 L nasal cannula maintaining SpO2. -- Tamiflu  30 mg p.o. twice daily -- Prednisone  40 mg p.o. daily -- DuoNebs BID --  Albuterol  neb every 4 hours as needed wheezing/shortness of breath -- Mucinex  dm twice daily -- Tussionex every 12 hours as needed cough -- Continue supplemental oxygen, 18 SpO2 greater than 92%; currently down to 2 L nasal cannula -- Incentive spirometry, flutter valve  Hypokalemia Potassium 3.3 on admission, repleted. -- BMP in a.m.  Chronic diastolic congestive heart failure, compensated HTN -- BP 115/54 -- Holding Entresto  due to borderline hypotension  Paroxysmal atrial fibrillation -- Amiodarone  100 mg p.o. daily -- Eliquis  5 g p.o. twice daily  HLD -- Atorvastatin  40 g p.o. daily  Anxiety/depression -- Fluoxetine  40 mg p.o. daily -- Alprazolam  1 mg p.o. 3 times daily as needed anxiety  GERD -- Protonix  40 mg p.o. daily  Obesity, class III Body mass index is 45.12 kg/m.  DVT prophylaxis:  apixaban  (ELIQUIS ) tablet 5 mg    Code Status: Full Code Family Communication: None  Disposition Plan:  Level of care: Progressive Status is: Inpatient Remains inpatient appropriate because: Weaning from supplemental oxygen    Consultants:  None  Procedures:  None  Antimicrobials:  None   Subjective: Patient seen examined bedside, lying in bed.  Watching TV.  Reports dyspnea slightly improved.  Remains afebrile.  Received neb treatment recently, RT has left oxygen off.  Discussed with patient hopefully can wean off of oxygen.  Remains on Tamiflu , neb treatments, steroids.  No other specific complaints, questions, concerns at this time.  Denies headache, no dizziness, no chest pain, no palpitations, no abdominal pain, no fever/chills/night sweats, no nausea cefonicid diarrhea, no focal weakness, no fatigue, no paresthesias.  No acute events overnight per nursing  staff.  Objective: Vitals:   09/11/24 0627 09/11/24 0909 09/11/24 1030 09/11/24 1047  BP: (!) 115/54   (!) 109/50  Pulse: 60   65  Resp: 18   16  Temp: 98.3 F (36.8 C)  99.5 F (37.5 C) 97.8 F (36.6 C)   TempSrc:   Oral Oral  SpO2: 90% (S) 97%  93%  Weight:      Height:        Intake/Output Summary (Last 24 hours) at 09/11/2024 1235 Last data filed at 09/11/2024 0700 Gross per 24 hour  Intake 540 ml  Output --  Net 540 ml   Filed Weights   09/10/24 1822 09/11/24 0500  Weight: 128.9 kg 126.8 kg    Examination:  Physical Exam: GEN: NAD, alert and oriented x 3, obese HEENT: NCAT, PERRL, EOMI, sclera clear, MMM PULM: Breath sounds slight diminished lower bases, no wheezes/crackles, normal respiratory effort without accessory muscle use, on 2 L nasal cannula with SpO2 93% at rest CV: RRR w/o M/G/R GI: abd soft, NTND, + BS MSK: Trace bilateral lower extremity peripheral edema, muscle strength globally intact 5/5 bilateral upper/lower extremities NEURO: No focal neurological deficit PSYCH: normal mood/affect Integumentary: No concerning rash/lesions/wounds noted on exposed skin surfaces    Data Reviewed: I have personally reviewed following labs and imaging studies  CBC: Recent Labs  Lab 09/10/24 1335 09/10/24 1337 09/11/24 0515  WBC 4.8  --  4.2  NEUTROABS 3.8  --   --   HGB 10.3* 11.6* 9.5*  HCT 32.2* 34.0* 30.0*  MCV 83.6  --  83.3  PLT 253  --  171   Basic Metabolic Panel: Recent Labs  Lab 09/10/24 1337 09/10/24 1443 09/11/24 0515  NA 133* 132* 131*  K 3.8 3.3* 4.6  CL  --  94* 96*  CO2  --  25 25  GLUCOSE  --  211* 137*  BUN  --  33* 32*  CREATININE  --  1.62* 1.29*  CALCIUM   --  8.7* 8.7*  MG  --   --  2.6*   GFR: Estimated Creatinine Clearance: 53.7 mL/min (A) (by C-G formula based on SCr of 1.29 mg/dL (H)). Liver Function Tests: Recent Labs  Lab 09/10/24 1443  AST 67*  ALT 51*  ALKPHOS 88  BILITOT 0.4  PROT 6.4*  ALBUMIN  4.0   No results for input(s): LIPASE, AMYLASE in the last 168 hours. No results for input(s): AMMONIA in the last 168 hours. Coagulation Profile: No results for input(s): INR, PROTIME in the last 168  hours. Cardiac Enzymes: No results for input(s): CKTOTAL, CKMB, CKMBINDEX, TROPONINI in the last 168 hours. BNP (last 3 results) Recent Labs    09/10/24 1443  PROBNP 1,421.0*   HbA1C: No results for input(s): HGBA1C in the last 72 hours. CBG: No results for input(s): GLUCAP in the last 168 hours. Lipid Profile: No results for input(s): CHOL, HDL, LDLCALC, TRIG, CHOLHDL, LDLDIRECT in the last 72 hours. Thyroid  Function Tests: No results for input(s): TSH, T4TOTAL, FREET4, T3FREE, THYROIDAB in the last 72 hours. Anemia Panel: No results for input(s): VITAMINB12, FOLATE, FERRITIN, TIBC, IRON, RETICCTPCT in the last 72 hours. Sepsis Labs: No results for input(s): PROCALCITON, LATICACIDVEN in the last 168 hours.  Recent Results (from the past 240 hours)  Resp panel by RT-PCR (RSV, Flu A&B, Covid) Anterior Nasal Swab     Status: Abnormal   Collection Time: 09/10/24  1:35 PM   Specimen: Anterior Nasal Swab  Result Value Ref Range  Status   SARS Coronavirus 2 by RT PCR NEGATIVE NEGATIVE Final    Comment: (NOTE) SARS-CoV-2 target nucleic acids are NOT DETECTED.  The SARS-CoV-2 RNA is generally detectable in upper respiratory specimens during the acute phase of infection. The lowest concentration of SARS-CoV-2 viral copies this assay can detect is 138 copies/mL. A negative result does not preclude SARS-Cov-2 infection and should not be used as the sole basis for treatment or other patient management decisions. A negative result may occur with  improper specimen collection/handling, submission of specimen other than nasopharyngeal swab, presence of viral mutation(s) within the areas targeted by this assay, and inadequate number of viral copies(<138 copies/mL). A negative result must be combined with clinical observations, patient history, and epidemiological information. The expected result is Negative.  Fact Sheet for Patients:   bloggercourse.com  Fact Sheet for Healthcare Providers:  seriousbroker.it  This test is no t yet approved or cleared by the [place]  FDA and  has been authorized for detection and/or diagnosis of SARS-CoV-2 by FDA under an Emergency Use Authorization (EUA). This EUA will remain  in effect (meaning this test can be used) for the duration of the COVID-19 declaration under Section 564(b)(1) of the Act, 21 U.S.C.section 360bbb-3(b)(1), unless the authorization is terminated  or revoked sooner.       Influenza A by PCR POSITIVE (A) NEGATIVE Final   Influenza B by PCR NEGATIVE NEGATIVE Final    Comment: (NOTE) The Xpert Xpress SARS-CoV-2/FLU/RSV plus assay is intended as an aid in the diagnosis of influenza from Nasopharyngeal swab specimens and should not be used as a sole basis for treatment. Nasal washings and aspirates are unacceptable for Xpert Xpress SARS-CoV-2/FLU/RSV testing.  Fact Sheet for Patients: bloggercourse.com  Fact Sheet for Healthcare Providers: seriousbroker.it  This test is not yet approved or cleared by the [place]  FDA and has been authorized for detection and/or diagnosis of SARS-CoV-2 by FDA under an Emergency Use Authorization (EUA). This EUA will remain in effect (meaning this test can be used) for the duration of the COVID-19 declaration under Section 564(b)(1) of the Act, 21 U.S.C. section 360bbb-3(b)(1), unless the authorization is terminated or revoked.     Resp Syncytial Virus by PCR NEGATIVE NEGATIVE Final    Comment: (NOTE) Fact Sheet for Patients: bloggercourse.com  Fact Sheet for Healthcare Providers: seriousbroker.it  This test is not yet approved or cleared by the [place]  FDA and has been authorized for detection and/or diagnosis of SARS-CoV-2 by FDA under an Emergency Use  Authorization (EUA). This EUA will remain in effect (meaning this test can be used) for the duration of the COVID-19 declaration under Section 564(b)(1) of the Act, 21 U.S.C. section 360bbb-3(b)(1), unless the authorization is terminated or revoked.  Performed at [facility], [address], [place], [place] [address]   Blood culture (routine x 2)     Status: None (Preliminary result)   Collection Time: 09/10/24  5:25 PM   Specimen: BLOOD  Result Value Ref Range Status   Specimen Description   Final    BLOOD RIGHT ANTECUBITAL Performed at [facility], [address], [place], [place] [address]    Special Requests   Final    BOTTLES DRAWN AEROBIC AND ANAEROBIC Blood Culture adequate volume Performed at [facility], [address], [place], [place] [address]    Culture   Final    NO GROWTH < 24 HOURS Performed at [facility], [address] [place], [place]  [address]    Report Status PENDING  Incomplete  Blood culture (routine x 2)     Status: None (Preliminary result)   Collection Time: 09/10/24  5:35 PM   Specimen: BLOOD RIGHT FOREARM  Result Value Ref Range Status   Specimen Description   Final    BLOOD RIGHT FOREARM Performed at [facility], [address] [place], [place] [address]    Special Requests   Final    BOTTLES DRAWN AEROBIC AND ANAEROBIC Blood Culture adequate volume Performed at [facility], [address] [place], [place] [address]    Culture   Final    NO GROWTH < 24 HOURS Performed at [facility], [address], [place], [place] [address]    Report Status PENDING  Incomplete         Radiology Studies: DG Chest Portable 1 View Result Date: 09/10/2024 CLINICAL DATA:  Shortness of breath EXAM: PORTABLE CHEST 1 VIEW COMPARISON:  Yesterday FINDINGS: Stable cardiomediastinal silhouette. No acute pulmonary disease is noted. Bony thorax is  unremarkable. IMPRESSION: No acute abnormality seen. Electronically Signed   By: Lynwood Landy Raddle M.D.   On: 09/10/2024 15:05        Scheduled Meds:  amiodarone   100 mg Oral QHS   apixaban   5 mg Oral BID   atorvastatin   40 mg Oral QHS   dextromethorphan -guaiFENesin   1 tablet Oral BID   FLUoxetine   40 mg Oral Daily   fluticasone   2 spray Each Nare Daily   gabapentin   300 mg Oral QHS   ipratropium-albuterol   3 mL Nebulization BID   loratadine   10 mg Oral Daily   oseltamivir   30 mg Oral BID   pantoprazole   40 mg Oral Daily   predniSONE   40 mg Oral Q breakfast   Continuous Infusions:   LOS: 0 days    Time spent: 52 minutes spent on 09/11/2024 caring for this patient face-to-face including chart review, ordering labs/tests, documenting, discussion with nursing staff, consultants, updating family and interview/physical exam    Camellia PARAS Matison Nuccio, DO [facility] Available via Epic secure chat 7am-7pm After these hours, please refer to coverage provider listed on [email or web address] 09/11/2024, 12:35 PM   "

## 2024-09-11 NOTE — Progress Notes (Signed)
 At rest oxygen saturation 96 %on room air. The patient ambulated 48 feet. During activity she reported worsening SOB, O2 sat 90-92% ra. Pt improvement in breathing after returning to bed. O2 sat increased 94-96 % at rest.

## 2024-09-11 NOTE — Progress Notes (Signed)
" °   09/11/24 2016  BiPAP/CPAP/SIPAP  BiPAP/CPAP/SIPAP Pt Type Adult  BiPAP/CPAP/SIPAP Resmed  Mask Type Full face mask  Dentures removed? Not applicable  Mask Size Medium  FiO2 (%) 21 %  Patient Home Machine No  Patient Home Mask No  Patient Home Tubing No  Auto Titrate Yes  Minimum cmH2O 5 cmH2O  Maximum cmH2O 15 cmH2O  CPAP/SIPAP surface wiped down Yes  Device Plugged into RED Power Outlet Yes  BiPAP/CPAP /SiPAP Vitals  Pulse Rate 74  Resp 17  SpO2 94 %  Bilateral Breath Sounds Diminished;Expiratory wheezes  MEWS Score/Color  MEWS Score 0  MEWS Score Color Green    "

## 2024-09-12 DIAGNOSIS — J9601 Acute respiratory failure with hypoxia: Secondary | ICD-10-CM | POA: Diagnosis not present

## 2024-09-12 LAB — COMPREHENSIVE METABOLIC PANEL WITH GFR
ALT: 116 U/L — ABNORMAL HIGH (ref 0–44)
AST: 124 U/L — ABNORMAL HIGH (ref 15–41)
Albumin: 3.5 g/dL (ref 3.5–5.0)
Alkaline Phosphatase: 76 U/L (ref 38–126)
Anion gap: 7 (ref 5–15)
BUN: 32 mg/dL — ABNORMAL HIGH (ref 8–23)
CO2: 28 mmol/L (ref 22–32)
Calcium: 8.7 mg/dL — ABNORMAL LOW (ref 8.9–10.3)
Chloride: 98 mmol/L (ref 98–111)
Creatinine, Ser: 1.07 mg/dL — ABNORMAL HIGH (ref 0.44–1.00)
GFR, Estimated: 55 mL/min — ABNORMAL LOW
Glucose, Bld: 104 mg/dL — ABNORMAL HIGH (ref 70–99)
Potassium: 5 mmol/L (ref 3.5–5.1)
Sodium: 133 mmol/L — ABNORMAL LOW (ref 135–145)
Total Bilirubin: 0.3 mg/dL (ref 0.0–1.2)
Total Protein: 6 g/dL — ABNORMAL LOW (ref 6.5–8.1)

## 2024-09-12 LAB — MAGNESIUM: Magnesium: 2.6 mg/dL — ABNORMAL HIGH (ref 1.7–2.4)

## 2024-09-12 MED ORDER — OSELTAMIVIR PHOSPHATE 75 MG PO CAPS
75.0000 mg | ORAL_CAPSULE | Freq: Two times a day (BID) | ORAL | Status: DC
  Administered 2024-09-12 – 2024-09-14 (×4): 75 mg via ORAL
  Filled 2024-09-12 (×4): qty 1

## 2024-09-12 NOTE — Evaluation (Signed)
 Physical Therapy Evaluation Patient Details Name: Kathy [name] MRN: [id number] DOB: 1951/02/06 Today's Date: 09/12/2024  History of Present Illness  Patient is a 73y.o. presented to ED on 09/10/24 with complaints on ongoing cough, weakness, and fatigue. Positive for influenza A. PMH includes: chronic CHF, paroxysmal afib, HTN, pulmonary HTN, CKD stage 3, anxiety/depression, GERD, obesity.  Clinical Impression  PTA, pt was living alone, IND with ADLs, ambulating with SPC, and still driving. Currently she has limited activity tolerance and endurance due to new flu A diagnosis. She has all DME needs at home and great support from her son. She will benefit from continued therapy while admitted to acute to address decreased muscular strength and functional activity tolerance, however does not require additional follow up PT at discharge.         If plan is discharge home, recommend the following: Assist for transportation;Assistance with cooking/housework   Can travel by private vehicle        Equipment Recommendations None recommended by PT  Recommendations for Other Services       Functional Status Assessment Patient has had a recent decline in their functional status and demonstrates the ability to make significant improvements in function in a reasonable and predictable amount of time.     Precautions / Restrictions Precautions Precautions: None Restrictions Weight Bearing Restrictions Per Provider Order: No      Mobility  Bed Mobility Overal bed mobility: Modified Independent             General bed mobility comments: completes with HOB elevated, increased time secondary to decreased functional endurance    Transfers Overall transfer level: Needs assistance Equipment used: Straight cane Transfers: Sit to/from Stand Sit to Stand: Contact guard assist                Ambulation/Gait Ambulation/Gait assistance: Contact guard assist Gait Distance (Feet):  30 Feet Assistive device: Straight cane   Gait velocity: decreased     General Gait Details: slow cadence, decreased activity tolerance, wide BOS, reports some fear of fall secondary to neuropathy however no LOB; generalized weakess and fatigue in BLE reported following short distance ambulation  Stairs            Wheelchair Mobility     Tilt Bed    Modified Rankin (Stroke Patients Only)       Balance Overall balance assessment: Needs assistance Sitting-balance support: Feet supported Sitting balance-Leahy Scale: Normal     Standing balance support: Single extremity supported, During functional activity Standing balance-Leahy Scale: Fair                               Pertinent Vitals/Pain Pain Assessment Pain Assessment: Faces Faces Pain Scale: Hurts little more Pain Location: chest when coughing    Home Living Family/patient expects to be discharged to:: Private residence Living Arrangements: Alone Available Help at Discharge: Family;Friend(s) Type of Home: House Home Access: Stairs to enter Entrance Stairs-Rails: Right Entrance Stairs-Number of Steps: 2 Alternate Level Stairs-Number of Steps: flight Home Layout: Two level;Able to live on main level with bedroom/bathroom (all needs met on first floor) Home Equipment: Rolling Walker (2 wheels);Rollator (4 wheels);Cane - single point;Hand held shower head;Grab bars - toilet;Lift chair;Other (comment) (electric bed w/o bedrails) Additional Comments: son available to d/c to help    Prior Function Prior Level of Function : Independent/Modified Independent;Driving;History of Falls (last six months)  Mobility Comments: able to ambulate without use of DME household distances, recently bought rollator to have just in case; uses SPC for community distances 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, 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; groceries delivered     Extremity/Trunk Assessment    Upper Extremity Assessment Upper Extremity Assessment: Overall WFL for tasks assessed    Lower Extremity Assessment Lower Extremity Assessment: Generalized weakness    Cervical / Trunk Assessment Cervical / Trunk Assessment: Normal  Communication   Communication Communication: No apparent difficulties    Cognition Arousal: Alert Behavior During Therapy: WFL for tasks assessed/performed   PT - Cognitive impairments: No apparent impairments                         Following commands: Intact       Cueing Cueing Techniques: Verbal cues     General Comments      Exercises     Assessment/Plan    PT Assessment Patient needs continued PT services  PT Problem List Decreased strength;Decreased activity tolerance;Decreased balance       PT Treatment Interventions Functional mobility training;Therapeutic exercise;Balance training;Gait training;Stair training    PT Goals (Current goals can be found in the Care Plan section)  Acute Rehab PT Goals Patient Stated Goal: get back home to my dog PT Goal Formulation: With patient Time For Goal Achievement: 09/26/24 Potential to Achieve Goals: Good    Frequency Min 2X/week     Co-evaluation               AM-PAC PT 6 Clicks Mobility  Outcome Measure Help needed turning from your back to your side while in a flat bed without using bedrails?: None Help needed moving from lying on your back to sitting on the side of a flat bed without using bedrails?: None Help needed moving to and from a bed to a chair (including a wheelchair)?: A Little Help needed standing up from a chair using your arms (e.g., wheelchair or bedside chair)?: A Little Help needed to walk in hospital room?: A Little Help needed climbing 3-5 steps with a railing? : A Little 6 Click Score: 20    End of Session Equipment Utilized During Treatment: Gait belt Activity Tolerance: Patient tolerated treatment well Patient left: in bed;with  call bell/phone within reach Nurse Communication: Mobility status;Other (comment) (O2 sats with mobility) PT Visit Diagnosis: Muscle weakness (generalized) (M62.81);Unsteadiness on feet (R26.81)    Time: 8399-8371 PT Time Calculation (min) (ACUTE ONLY): 28 min   Charges:   PT Evaluation $PT Eval Low Complexity: 1 Low PT Treatments $Therapeutic Activity: 8-22 mins           Kathy [name], PT, DPT   [facility] 09/12/2024, 4:33 PM

## 2024-09-12 NOTE — Progress Notes (Signed)
" °   09/12/24 2300  BiPAP/CPAP/SIPAP  BiPAP/CPAP/SIPAP Pt Type Adult (prefers self placement)  BiPAP/CPAP/SIPAP Resmed  Mask Type Full face mask  Dentures removed? Not applicable  Mask Size Medium  FiO2 (%) 21 %  Patient Home Machine No  Patient Home Mask No  Patient Home Tubing No  Auto Titrate Yes  Minimum cmH2O 5 cmH2O  Maximum cmH2O 15 cmH2O  CPAP/SIPAP surface wiped down Yes  Device Plugged into RED Power Outlet Yes  BiPAP/CPAP /SiPAP Vitals  Pulse Rate 67  Resp 17  SpO2 96 %  Bilateral Breath Sounds Clear;Diminished  MEWS Score/Color  MEWS Score 0  MEWS Score Color Green    "

## 2024-09-12 NOTE — Progress Notes (Addendum)
 " PROGRESS NOTE    Kathy [name]  FMW:[id number] DOB: 1951/06/24 DOA: 09/10/2024 PCP: Theophilus Andrews, Tully GRADE, MD    Brief Narrative:   Kathy [name] is a 73 y.o. female with past medical history significant for chronic diastolic congestive heart failure, paroxysmal atrial fibrillation on Eliquis , HTN, pulmonary HTN, CKD stage IIIb, anxiety/depression, GERD, obesity who presented to [facility] on 09/10/2024 with progressive shortness of breath, cough, sore throat and congestion.  Ongoing over the last few days.  Also endorses fever and chills.  Cough productive of clear sputum.  When family arrived to patient's home they found her in respiratory distress with portable pulse oximeter with SpO2 between 79-84%.  Husband then transported patient to the ED for further evaluation and management.  No history of asthma or COPD.  In the ED, temperature 100.8 F, HR 71, RR 17, BP 122/43, SpO2 88% on 3 L nasal cannula.  WBC 4.8, hemoglobin 10.3, platelet count 253.  Sodium 132, potassium 3.3, chloride 94, CO2 25, glucose 211, BUN 33, creat 1.62.  VBG with pH 7.356, pCO2 49.8, PaO2 41.  Influenza A PCR positive.  Respiratory syncytial virus/COVID-19 PCR negative.  Chest x-ray with no acute abnormality noted.  Patient received multiple nebulized treatments, IV Solu-Medrol , IV magnesium  in the ED.  TRH was consulted and patient was transferred to [facility] for further evaluation and management.  Assessment & Plan:   Acute respiratory failure with hypoxia, POA Influenza A viral infection Patient presenting to ED after being found hypoxic by family members with SpO2 79-84% at home.  Patient endorses progressive shortness of breath with associated fever/chills, cough over the last 3 days.  Was given multiple nebulizer treatment, IV Solu-Medrol  and requiring 4 L nasal cannula maintaining SpO2. -- Tamiflu  30 mg p.o. twice daily -- Prednisone  40 mg p.o. daily -- DuoNebs BID --  Albuterol  neb every 4 hours as needed wheezing/shortness of breath -- Mucinex  DM twice daily -- Tussionex every 12 hours as needed cough -- Continue supplemental oxygen, maintain SpO2 > 92%; currently weaned off of oxygen but SpO2 low 90s.  -- Incentive spirometry, flutter valve  Hypokalemia Repleted. -- BMP in a.m.  Chronic diastolic congestive heart failure, compensated HTN -- BP improving -- Continue to hold Entresto , if BP continues to improve may resume tomorrow  Paroxysmal atrial fibrillation -- Amiodarone  100 mg p.o. daily -- Eliquis  5 g p.o. twice daily  HLD -- Atorvastatin  40 g p.o. daily  Anxiety/depression -- Fluoxetine  40 mg p.o. daily -- Alprazolam  1 mg p.o. 3 times daily as needed anxiety  GERD -- Protonix  40 mg p.o. daily  Obesity, class III Body mass index is 45.9 kg/m.  Weakness/debility/deconditioning: -- PT evaluation  DVT prophylaxis:  apixaban  (ELIQUIS ) tablet 5 mg    Code Status: Full Code Family Communication: None  Disposition Plan:  Level of care: Progressive Status is: Inpatient Remains inpatient appropriate because: PT evaluation    Consultants:  None  Procedures:  None  Antimicrobials:  None   Subjective: Patient seen examined bedside, lying in bed.  Watching TV.  Reports just received neb treatment.  Also reports recently went to the restroom with significant dyspnea and weakness.  Oxygen remains titrated off.  Remains on Tamiflu .  No other specific complaints, questions, concerns at this time.  Denies headache, no dizziness, no chest pain, no palpitations, no abdominal pain, no fever/chills/night sweats, no nausea/vomiting/diarrhea, no focal weakness, no fatigue, no paresthesias.  No acute events overnight per nursing staff.  Objective: Vitals:   09/11/24 2113 09/12/24 0500 09/12/24 0516 09/12/24 0915  BP: (!) 131/45  (!) 151/63   Pulse: 63  63   Resp: 18  18 19   Temp: 98.7 F (37.1 C)  98.4 F (36.9 C)   TempSrc:    Oral   SpO2: 90%  91% 94%  Weight:  129 kg    Height:        Intake/Output Summary (Last 24 hours) at 09/12/2024 1219 Last data filed at 09/11/2024 2100 Gross per 24 hour  Intake 250 ml  Output --  Net 250 ml   Filed Weights   09/10/24 1822 09/11/24 0500 09/12/24 0500  Weight: 128.9 kg 126.8 kg 129 kg    Examination:  Physical Exam: GEN: NAD, alert and oriented x 3, obese HEENT: NCAT, PERRL, EOMI, sclera clear, MMM PULM: Breath sounds slight diminished lower bases, no wheezes/crackles, normal respiratory effort without accessory muscle use, on room air with SpO2 94% at rest.   CV: RRR w/o M/G/R GI: abd soft, NTND, + BS MSK: Trace bilateral lower extremity peripheral edema, muscle strength globally intact 5/5 bilateral upper/lower extremities NEURO: No focal neurological deficit PSYCH: normal mood/affect Integumentary: No concerning rash/lesions/wounds noted on exposed skin surfaces    Data Reviewed: I have personally reviewed following labs and imaging studies  CBC: Recent Labs  Lab 09/10/24 1335 09/10/24 1337 09/11/24 0515  WBC 4.8  --  4.2  NEUTROABS 3.8  --   --   HGB 10.3* 11.6* 9.5*  HCT 32.2* 34.0* 30.0*  MCV 83.6  --  83.3  PLT 253  --  171   Basic Metabolic Panel: Recent Labs  Lab 09/10/24 1337 09/10/24 1443 09/11/24 0515 09/12/24 0458  NA 133* 132* 131* 133*  K 3.8 3.3* 4.6 5.0  CL  --  94* 96* 98  CO2  --  25 25 28   GLUCOSE  --  211* 137* 104*  BUN  --  33* 32* 32*  CREATININE  --  1.62* 1.29* 1.07*  CALCIUM   --  8.7* 8.7* 8.7*  MG  --   --  2.6* 2.6*   GFR: Estimated Creatinine Clearance: 65.4 mL/min (A) (by C-G formula based on SCr of 1.07 mg/dL (H)). Liver Function Tests: Recent Labs  Lab 09/10/24 1443 09/12/24 0458  AST 67* 124*  ALT 51* 116*  ALKPHOS 88 76  BILITOT 0.4 0.3  PROT 6.4* 6.0*  ALBUMIN  4.0 3.5   No results for input(s): LIPASE, AMYLASE in the last 168 hours. No results for input(s): AMMONIA in the last  168 hours. Coagulation Profile: No results for input(s): INR, PROTIME in the last 168 hours. Cardiac Enzymes: No results for input(s): CKTOTAL, CKMB, CKMBINDEX, TROPONINI in the last 168 hours. BNP (last 3 results) Recent Labs    09/10/24 1443  PROBNP 1,421.0*   HbA1C: No results for input(s): HGBA1C in the last 72 hours. CBG: No results for input(s): GLUCAP in the last 168 hours. Lipid Profile: No results for input(s): CHOL, HDL, LDLCALC, TRIG, CHOLHDL, LDLDIRECT in the last 72 hours. Thyroid  Function Tests: No results for input(s): TSH, T4TOTAL, FREET4, T3FREE, THYROIDAB in the last 72 hours. Anemia Panel: No results for input(s): VITAMINB12, FOLATE, FERRITIN, TIBC, IRON, RETICCTPCT in the last 72 hours. Sepsis Labs: No results for input(s): PROCALCITON, LATICACIDVEN in the last 168 hours.  Recent Results (from the past 240 hours)  Resp panel by RT-PCR (RSV, Flu A&B, Covid) Anterior Nasal Swab     Status: Abnormal  Collection Time: 09/10/24  1:35 PM   Specimen: Anterior Nasal Swab  Result Value Ref Range Status   SARS Coronavirus 2 by RT PCR NEGATIVE NEGATIVE Final    Comment: (NOTE) SARS-CoV-2 target nucleic acids are NOT DETECTED.  The SARS-CoV-2 RNA is generally detectable in upper respiratory specimens during the acute phase of infection. The lowest concentration of SARS-CoV-2 viral copies this assay can detect is 138 copies/mL. A negative result does not preclude SARS-Cov-2 infection and should not be used as the sole basis for treatment or other patient management decisions. A negative result may occur with  improper specimen collection/handling, submission of specimen other than nasopharyngeal swab, presence of viral mutation(s) within the areas targeted by this assay, and inadequate number of viral copies(<138 copies/mL). A negative result must be combined with clinical observations, patient history, and  epidemiological information. The expected result is Negative.  Fact Sheet for Patients:  bloggercourse.com  Fact Sheet for Healthcare Providers:  seriousbroker.it  This test is no t yet approved or cleared by the [place]  FDA and  has been authorized for detection and/or diagnosis of SARS-CoV-2 by FDA under an Emergency Use Authorization (EUA). This EUA will remain  in effect (meaning this test can be used) for the duration of the COVID-19 declaration under Section 564(b)(1) of the Act, 21 U.S.C.section 360bbb-3(b)(1), unless the authorization is terminated  or revoked sooner.       Influenza A by PCR POSITIVE (A) NEGATIVE Final   Influenza B by PCR NEGATIVE NEGATIVE Final    Comment: (NOTE) The Xpert Xpress SARS-CoV-2/FLU/RSV plus assay is intended as an aid in the diagnosis of influenza from Nasopharyngeal swab specimens and should not be used as a sole basis for treatment. Nasal washings and aspirates are unacceptable for Xpert Xpress SARS-CoV-2/FLU/RSV testing.  Fact Sheet for Patients: bloggercourse.com  Fact Sheet for Healthcare Providers: seriousbroker.it  This test is not yet approved or cleared by the [place]  FDA and has been authorized for detection and/or diagnosis of SARS-CoV-2 by FDA under an Emergency Use Authorization (EUA). This EUA will remain in effect (meaning this test can be used) for the duration of the COVID-19 declaration under Section 564(b)(1) of the Act, 21 U.S.C. section 360bbb-3(b)(1), unless the authorization is terminated or revoked.     Resp Syncytial Virus by PCR NEGATIVE NEGATIVE Final    Comment: (NOTE) Fact Sheet for Patients: bloggercourse.com  Fact Sheet for Healthcare Providers: seriousbroker.it  This test is not yet approved or cleared by the [place]  FDA and has  been authorized for detection and/or diagnosis of SARS-CoV-2 by FDA under an Emergency Use Authorization (EUA). This EUA will remain in effect (meaning this test can be used) for the duration of the COVID-19 declaration under Section 564(b)(1) of the Act, 21 U.S.C. section 360bbb-3(b)(1), unless the authorization is terminated or revoked.  Performed at [facility], [address], [place], [place] [address]   Blood culture (routine x 2)     Status: None (Preliminary result)   Collection Time: 09/10/24  5:25 PM   Specimen: BLOOD  Result Value Ref Range Status   Specimen Description   Final    BLOOD RIGHT ANTECUBITAL Performed at [facility], [address] [place], [place] [address]    Special Requests   Final    BOTTLES DRAWN AEROBIC AND ANAEROBIC Blood Culture adequate volume Performed at [facility], [address] [place], [place] [address]    Culture   Final  NO GROWTH 2 DAYS Performed at [facility], [address], [place], [place] [address]    Report Status PENDING  Incomplete  Blood culture (routine x 2)     Status: None (Preliminary result)   Collection Time: 09/10/24  5:35 PM   Specimen: BLOOD RIGHT FOREARM  Result Value Ref Range Status   Specimen Description   Final    BLOOD RIGHT FOREARM Performed at [facility], [address], [place], [place] [address]    Special Requests   Final    BOTTLES DRAWN AEROBIC AND ANAEROBIC Blood Culture adequate volume Performed at [facility], [address] [place], [place] [address]    Culture   Final    NO GROWTH 2 DAYS Performed at [facility], [address], [place], [place] [address]    Report Status PENDING  Incomplete         Radiology Studies: DG Chest Portable 1 View Result Date: 09/10/2024 CLINICAL DATA:  Shortness of breath EXAM: PORTABLE CHEST 1 VIEW COMPARISON:  Yesterday FINDINGS: Stable  cardiomediastinal silhouette. No acute pulmonary disease is noted. Bony thorax is unremarkable. IMPRESSION: No acute abnormality seen. Electronically Signed   By: Lynwood Landy Raddle M.D.   On: 09/10/2024 15:05        Scheduled Meds:  amiodarone   100 mg Oral QHS   apixaban   5 mg Oral BID   atorvastatin   40 mg Oral QHS   dextromethorphan -guaiFENesin   1 tablet Oral BID   FLUoxetine   40 mg Oral Daily   fluticasone   2 spray Each Nare Daily   gabapentin   300 mg Oral QHS   ipratropium-albuterol   3 mL Nebulization BID   loratadine   10 mg Oral Daily   oseltamivir   75 mg Oral BID   pantoprazole   40 mg Oral Daily   predniSONE   40 mg Oral Q breakfast   Continuous Infusions:   LOS: 1 day    Time spent: 48 minutes spent on 09/12/2024 caring for this patient face-to-face including chart review, ordering labs/tests, documenting, discussion with nursing staff, consultants, updating family and interview/physical exam    Camellia PARAS Alverto Shedd, DO [facility] Available via Epic secure chat 7am-7pm After these hours, please refer to coverage provider listed on [email or web address] 09/12/2024, 12:19 PM   "

## 2024-09-13 DIAGNOSIS — J9601 Acute respiratory failure with hypoxia: Secondary | ICD-10-CM | POA: Diagnosis not present

## 2024-09-13 LAB — GLUCOSE, CAPILLARY
Glucose-Capillary: 85 mg/dL (ref 70–99)
Glucose-Capillary: 88 mg/dL (ref 70–99)
Glucose-Capillary: 138 mg/dL — ABNORMAL HIGH (ref 70–99)

## 2024-09-13 NOTE — Progress Notes (Signed)
" °   09/13/24 2155  BiPAP/CPAP/SIPAP  BiPAP/CPAP/SIPAP Pt Type Adult (prefers self placement)  BiPAP/CPAP/SIPAP Resmed  Mask Type Full face mask  Dentures removed? Not applicable  Mask Size Medium  Patient Home Machine No  Patient Home Mask No  Patient Home Tubing No  Auto Titrate Yes  CPAP/SIPAP surface wiped down Yes  Device Plugged into RED Power Outlet Yes  BiPAP/CPAP /SiPAP Vitals  Pulse Rate 63  Resp 18  SpO2 94 %  Bilateral Breath Sounds Clear;Diminished;Expiratory wheezes  MEWS Score/Color  MEWS Score 0  MEWS Score Color Green    "

## 2024-09-13 NOTE — Progress Notes (Addendum)
 " PROGRESS NOTE    Kathy [name]  FMW:[id number] DOB: May 03, 1951 DOA: 09/10/2024 PCP: Theophilus Andrews, Tully GRADE, MD    Brief Narrative:   Kathy [name] is a 73 y.o. female with past medical history significant for chronic diastolic congestive heart failure, paroxysmal atrial fibrillation on Eliquis , HTN, pulmonary HTN, CKD stage IIIb, anxiety/depression, GERD, obesity who presented to [facility] on 09/10/2024 with progressive shortness of breath, cough, sore throat and congestion.  Ongoing over the last few days.  Also endorses fever and chills.  Cough productive of clear sputum.  When family arrived to patient's home they found her in respiratory distress with portable pulse oximeter with SpO2 between 79-84%.  Husband then transported patient to the ED for further evaluation and management.  No history of asthma or COPD.  In the ED, temperature 100.8 F, HR 71, RR 17, BP 122/43, SpO2 88% on 3 L nasal cannula.  WBC 4.8, hemoglobin 10.3, platelet count 253.  Sodium 132, potassium 3.3, chloride 94, CO2 25, glucose 211, BUN 33, creat 1.62.  VBG with pH 7.356, pCO2 49.8, PaO2 41.  Influenza A PCR positive.  Respiratory syncytial virus/COVID-19 PCR negative.  Chest x-ray with no acute abnormality noted.  Patient received multiple nebulized treatments, IV Solu-Medrol , IV magnesium  in the ED.  TRH was consulted and patient was transferred to [facility] for further evaluation and management.  Assessment & Plan:   Acute respiratory failure with hypoxia, POA Influenza A viral infection Patient presenting to ED after being found hypoxic by family members with SpO2 79-84% at home.  Patient endorses progressive shortness of breath with associated fever/chills, cough over the last 3 days.  Was given multiple nebulizer treatment, IV Solu-Medrol  and requiring 4 L nasal cannula maintaining SpO2. -- Tamiflu  30 mg p.o. twice daily -- Will discontinue prednisone  due to elevation of  LFTs -- DuoNebs BID -- Albuterol  neb every 4 hours as needed wheezing/shortness of breath -- Mucinex  DM twice daily -- Tussionex every 12 hours as needed cough -- Continue supplemental oxygen, maintain SpO2 > 92%; currently weaned off of oxygen but SpO2 low 90s.  -- Incentive spirometry, flutter valve  Transaminitis Denies any abdominal discomfort, suspect etiology likely secondary to acute infectious process versus prednisone  use. -- Avoid hepatotoxins -- Discontinue prednisone  today -- Repeat CMP in a.m.  Hypokalemia Repleted. -- BMP in a.m.  Chronic diastolic congestive heart failure, compensated HTN -- BP improving -- Continue to hold Entresto , if BP continues to improve may resume tomorrow  Paroxysmal atrial fibrillation -- Amiodarone  100 mg p.o. daily -- Eliquis  5 g p.o. twice daily  HLD -- Atorvastatin  40 g p.o. daily  Anxiety/depression -- Fluoxetine  40 mg p.o. daily -- Alprazolam  1 mg p.o. 3 times daily as needed anxiety  GERD -- Protonix  40 mg p.o. daily  Obesity, class III Body mass index is 45.16 kg/m.  Weakness/debility/deconditioning: -- PT evaluation with no needs identified on 12/27  DVT prophylaxis:  apixaban  (ELIQUIS ) tablet 5 mg    Code Status: Full Code Family Communication: None  Disposition Plan:  Level of care: Med-Surg Status is: Inpatient Remains inpatient appropriate because: Anticipate discharge home likely tomorrow    Consultants:  None  Procedures:  None  Antimicrobials:  None   Subjective: Patient seen examined bedside, sitting in bedside chair.  Continues with dyspnea, slowly improving.  Now weaned off of oxygen.  Seen by PT yesterday with no needs identified.  Updated patient's son, Kathy [name] via speaker phone in room.  Patient  hopeful to be able to discharge home tomorrow.No other specific complaints, questions, concerns at this time.  Denies headache, no dizziness, no chest pain, no palpitations, no abdominal pain,  no fever/chills/night sweats, no nausea/vomiting/diarrhea, no focal weakness, no fatigue, no paresthesias.  No acute events overnight per nursing staff.  Objective: Vitals:   09/12/24 2144 09/12/24 2300 09/13/24 0320 09/13/24 0506  BP: (!) 157/65   (!) 144/69  Pulse: 62 67  (!) 55  Resp: 16 17  20   Temp: 98.1 F (36.7 C)   97.8 F (36.6 C)  TempSrc: Oral   Oral  SpO2: 95% 96%  94%  Weight:   126.9 kg   Height:        Intake/Output Summary (Last 24 hours) at 09/13/2024 1217 Last data filed at 09/13/2024 0900 Gross per 24 hour  Intake 720 ml  Output --  Net 720 ml   Filed Weights   09/11/24 0500 09/12/24 0500 09/13/24 0320  Weight: 126.8 kg 129 kg 126.9 kg    Examination:  Physical Exam: GEN: NAD, alert and oriented x 3, obese HEENT: NCAT, PERRL, EOMI, sclera clear, MMM PULM: Breath sounds slight diminished lower bases, no wheezes/crackles, normal respiratory effort without accessory muscle use, on room air with SpO2 94% at rest.   CV: RRR w/o M/G/R GI: abd soft, NTND, + BS MSK: Trace bilateral lower extremity peripheral edema, muscle strength globally intact 5/5 bilateral upper/lower extremities NEURO: No focal neurological deficit PSYCH: normal mood/affect Integumentary: No concerning rash/lesions/wounds noted on exposed skin surfaces    Data Reviewed: I have personally reviewed following labs and imaging studies  CBC: Recent Labs  Lab 09/10/24 1335 09/10/24 1337 09/11/24 0515  WBC 4.8  --  4.2  NEUTROABS 3.8  --   --   HGB 10.3* 11.6* 9.5*  HCT 32.2* 34.0* 30.0*  MCV 83.6  --  83.3  PLT 253  --  171   Basic Metabolic Panel: Recent Labs  Lab 09/10/24 1337 09/10/24 1443 09/11/24 0515 09/12/24 0458  NA 133* 132* 131* 133*  K 3.8 3.3* 4.6 5.0  CL  --  94* 96* 98  CO2  --  25 25 28   GLUCOSE  --  211* 137* 104*  BUN  --  33* 32* 32*  CREATININE  --  1.62* 1.29* 1.07*  CALCIUM   --  8.7* 8.7* 8.7*  MG  --   --  2.6* 2.6*   GFR: Estimated  Creatinine Clearance: 64.7 mL/min (A) (by C-G formula based on SCr of 1.07 mg/dL (H)). Liver Function Tests: Recent Labs  Lab 09/10/24 1443 09/12/24 0458  AST 67* 124*  ALT 51* 116*  ALKPHOS 88 76  BILITOT 0.4 0.3  PROT 6.4* 6.0*  ALBUMIN  4.0 3.5   No results for input(s): LIPASE, AMYLASE in the last 168 hours. No results for input(s): AMMONIA in the last 168 hours. Coagulation Profile: No results for input(s): INR, PROTIME in the last 168 hours. Cardiac Enzymes: No results for input(s): CKTOTAL, CKMB, CKMBINDEX, TROPONINI in the last 168 hours. BNP (last 3 results) Recent Labs    09/10/24 1443  PROBNP 1,421.0*   HbA1C: No results for input(s): HGBA1C in the last 72 hours. CBG: Recent Labs  Lab 09/13/24 0727 09/13/24 1130  GLUCAP 85 88   Lipid Profile: No results for input(s): CHOL, HDL, LDLCALC, TRIG, CHOLHDL, LDLDIRECT in the last 72 hours. Thyroid  Function Tests: No results for input(s): TSH, T4TOTAL, FREET4, T3FREE, THYROIDAB in the last 72 hours. Anemia Panel:  No results for input(s): VITAMINB12, FOLATE, FERRITIN, TIBC, IRON, RETICCTPCT in the last 72 hours. Sepsis Labs: No results for input(s): PROCALCITON, LATICACIDVEN in the last 168 hours.  Recent Results (from the past 240 hours)  Resp panel by RT-PCR (RSV, Flu A&B, Covid) Anterior Nasal Swab     Status: Abnormal   Collection Time: 09/10/24  1:35 PM   Specimen: Anterior Nasal Swab  Result Value Ref Range Status   SARS Coronavirus 2 by RT PCR NEGATIVE NEGATIVE Final    Comment: (NOTE) SARS-CoV-2 target nucleic acids are NOT DETECTED.  The SARS-CoV-2 RNA is generally detectable in upper respiratory specimens during the acute phase of infection. The lowest concentration of SARS-CoV-2 viral copies this assay can detect is 138 copies/mL. A negative result does not preclude SARS-Cov-2 infection and should not be used as the sole basis for treatment  or other patient management decisions. A negative result may occur with  improper specimen collection/handling, submission of specimen other than nasopharyngeal swab, presence of viral mutation(s) within the areas targeted by this assay, and inadequate number of viral copies(<138 copies/mL). A negative result must be combined with clinical observations, patient history, and epidemiological information. The expected result is Negative.  Fact Sheet for Patients:  bloggercourse.com  Fact Sheet for Healthcare Providers:  seriousbroker.it  This test is no t yet approved or cleared by the [place]  FDA and  has been authorized for detection and/or diagnosis of SARS-CoV-2 by FDA under an Emergency Use Authorization (EUA). This EUA will remain  in effect (meaning this test can be used) for the duration of the COVID-19 declaration under Section 564(b)(1) of the Act, 21 U.S.C.section 360bbb-3(b)(1), unless the authorization is terminated  or revoked sooner.       Influenza A by PCR POSITIVE (A) NEGATIVE Final   Influenza B by PCR NEGATIVE NEGATIVE Final    Comment: (NOTE) The Xpert Xpress SARS-CoV-2/FLU/RSV plus assay is intended as an aid in the diagnosis of influenza from Nasopharyngeal swab specimens and should not be used as a sole basis for treatment. Nasal washings and aspirates are unacceptable for Xpert Xpress SARS-CoV-2/FLU/RSV testing.  Fact Sheet for Patients: bloggercourse.com  Fact Sheet for Healthcare Providers: seriousbroker.it  This test is not yet approved or cleared by the [place]  FDA and has been authorized for detection and/or diagnosis of SARS-CoV-2 by FDA under an Emergency Use Authorization (EUA). This EUA will remain in effect (meaning this test can be used) for the duration of the COVID-19 declaration under Section 564(b)(1) of the Act, 21  U.S.C. section 360bbb-3(b)(1), unless the authorization is terminated or revoked.     Resp Syncytial Virus by PCR NEGATIVE NEGATIVE Final    Comment: (NOTE) Fact Sheet for Patients: bloggercourse.com  Fact Sheet for Healthcare Providers: seriousbroker.it  This test is not yet approved or cleared by the [place]  FDA and has been authorized for detection and/or diagnosis of SARS-CoV-2 by FDA under an Emergency Use Authorization (EUA). This EUA will remain in effect (meaning this test can be used) for the duration of the COVID-19 declaration under Section 564(b)(1) of the Act, 21 U.S.C. section 360bbb-3(b)(1), unless the authorization is terminated or revoked.  Performed at [facility], [address], [place], [place] [address]   Blood culture (routine x 2)     Status: None (Preliminary result)   Collection Time: 09/10/24  5:25 PM   Specimen: BLOOD  Result Value Ref Range Status   Specimen Description   Final    BLOOD  RIGHT ANTECUBITAL Performed at [facility], [address], [place], [place] [address]    Special Requests   Final    BOTTLES DRAWN AEROBIC AND ANAEROBIC Blood Culture adequate volume Performed at [facility], [address], [place], [place] [address]    Culture   Final    NO GROWTH 3 DAYS Performed at [facility], [address], [place], [place] [address]    Report Status PENDING  Incomplete  Blood culture (routine x 2)     Status: None (Preliminary result)   Collection Time: 09/10/24  5:35 PM   Specimen: BLOOD RIGHT FOREARM  Result Value Ref Range Status   Specimen Description   Final    BLOOD RIGHT FOREARM Performed at [facility], [address] [place], [place] [address]    Special Requests   Final    BOTTLES DRAWN AEROBIC AND ANAEROBIC Blood Culture adequate volume Performed at [facility], [address] [place], [place] [address]    Culture   Final    NO GROWTH 3 DAYS Performed at [facility], [address], [place], [place] [address]    Report Status PENDING  Incomplete         Radiology Studies: No results found.       Scheduled Meds:  amiodarone   100 mg Oral QHS   apixaban   5 mg Oral BID   atorvastatin   40 mg Oral QHS   dextromethorphan -guaiFENesin   1 tablet Oral BID   FLUoxetine   40 mg Oral Daily   fluticasone   2 spray Each Nare Daily   gabapentin   300 mg Oral QHS   ipratropium-albuterol   3 mL Nebulization BID   loratadine   10 mg Oral Daily   oseltamivir   75 mg Oral BID   pantoprazole   40 mg Oral Daily   Continuous Infusions:   LOS: 2 days    Time spent: 48 minutes spent on 09/13/2024 caring for this patient face-to-face including chart review, ordering labs/tests, documenting, discussion with nursing staff, consultants, updating family and interview/physical exam    Camellia PARAS Jabir Dahlem, DO [facility] Available via Epic secure chat 7am-7pm After these hours, please refer to coverage provider listed on [email or web address] 09/13/2024, 12:17 PM   "

## 2024-09-13 NOTE — Plan of Care (Signed)

## 2024-09-13 NOTE — Progress Notes (Signed)
 Mobility Specialist Progress Note:  RA  During Mobility: 85-88% SPO2 Post-Mobility: 88% SPO2 (after 1 min sitting rest)  09/13/24 1534  Mobility  Activity Ambulated with assistance  Level of Assistance Modified independent, requires aide device or extra time  Assistive Device [employer] Ambulated (ft) 20 ft  Activity Response Tolerated poorly  Mobility Referral Yes  Mobility visit 1 Mobility  Mobility Specialist Start Time (ACUTE ONLY) 1509  Mobility Specialist Stop Time (ACUTE ONLY) 1518  Mobility Specialist Time Calculation (min) (ACUTE ONLY) 9 min   Pt was received in bed and agreed to mobility. Pt stated having cough spells prior to session and slight shortness of breath. 1x brief standing break during ambulation due to desaturation. Deferred further mobility. Returned to bed with all needs met. Call bell in reach. RN notified.  [employer] - Mobility Specialist

## 2024-09-13 NOTE — Progress Notes (Signed)
 Mobility Specialist Progress Note:  RA During Mobility: 91-95% SPO2  09/13/24 1030  Mobility  Activity  (Chair Exercises)  Level of Assistance Independent  Range of Motion/Exercises Active  Activity Response Tolerated well  Mobility Referral Yes  Mobility visit 1 Mobility  Mobility Specialist Start Time (ACUTE ONLY) 0920  Mobility Specialist Stop Time (ACUTE ONLY) 0935  Mobility Specialist Time Calculation (min) (ACUTE ONLY) 15 min   Pt was received in recliner and agreed to mobility. Pt stated shortness of breath prior to session, opted for chair exercises.  Seated BLE Exercises: 10 reps each  1) Ankle Pumps:  2) Knee Extension (hold 3 seconds)  3) Marching    4) Hip Adduction (pillow squeezes)   Pt had no complaints/issues during session. Returned to bed with all needs met, call bell in reach.  [employer] - Mobility Specialist

## 2024-09-14 ENCOUNTER — Other Ambulatory Visit: Payer: Self-pay | Admitting: Internal Medicine

## 2024-09-14 LAB — CBC
HCT: 30.4 % — ABNORMAL LOW (ref 36.0–46.0)
Hemoglobin: 9.6 g/dL — ABNORMAL LOW (ref 12.0–15.0)
MCH: 26.9 pg (ref 26.0–34.0)
MCHC: 31.6 g/dL (ref 30.0–36.0)
MCV: 85.2 fL (ref 80.0–100.0)
Platelets: 157 K/uL (ref 150–400)
RBC: 3.57 MIL/uL — ABNORMAL LOW (ref 3.87–5.11)
RDW: 16.2 % — ABNORMAL HIGH (ref 11.5–15.5)
WBC: 4.4 K/uL (ref 4.0–10.5)
nRBC: 0 % (ref 0.0–0.2)

## 2024-09-14 LAB — COMPREHENSIVE METABOLIC PANEL WITH GFR
ALT: 81 U/L — ABNORMAL HIGH (ref 0–44)
AST: 54 U/L — ABNORMAL HIGH (ref 15–41)
Albumin: 3.5 g/dL (ref 3.5–5.0)
Alkaline Phosphatase: 73 U/L (ref 38–126)
Anion gap: 8 (ref 5–15)
BUN: 22 mg/dL (ref 8–23)
CO2: 28 mmol/L (ref 22–32)
Calcium: 9 mg/dL (ref 8.9–10.3)
Chloride: 99 mmol/L (ref 98–111)
Creatinine, Ser: 1.19 mg/dL — ABNORMAL HIGH (ref 0.44–1.00)
GFR, Estimated: 48 mL/min — ABNORMAL LOW
Glucose, Bld: 91 mg/dL (ref 70–99)
Potassium: 4.6 mmol/L (ref 3.5–5.1)
Sodium: 135 mmol/L (ref 135–145)
Total Bilirubin: 0.4 mg/dL (ref 0.0–1.2)
Total Protein: 5.9 g/dL — ABNORMAL LOW (ref 6.5–8.1)

## 2024-09-14 LAB — GLUCOSE, CAPILLARY
Glucose-Capillary: 84 mg/dL (ref 70–99)
Glucose-Capillary: 83 mg/dL (ref 70–99)

## 2024-09-14 MED ORDER — OSELTAMIVIR PHOSPHATE 75 MG PO CAPS: 75.0000 mg | ORAL_CAPSULE | Freq: Two times a day (BID) | ORAL | 0 refills | Status: AC

## 2024-09-14 MED ORDER — DM-GUAIFENESIN ER 30-600 MG PO TB12: 1.0000 | ORAL_TABLET | Freq: Two times a day (BID) | ORAL | 0 refills | Status: DC

## 2024-09-14 MED ORDER — IPRATROPIUM-ALBUTEROL 0.5-2.5 (3) MG/3ML IN SOLN: 3.0000 mL | Freq: Two times a day (BID) | RESPIRATORY_TRACT | 0 refills | Status: AC

## 2024-09-14 NOTE — TOC Initial Note (Addendum)
 Transition of Care [facility]) - Initial/Assessment Note    Patient Details  Name: Kathy [name] MRN: [id number] Date of Birth: [birth year]  [facility]) CM/SW Contact:    Sonda Manuella Quill, RN Phone Number: 09/14/2024, 1:46 PM  Clinical Narrative:                 Order received for nebulizer; spoke w/ pt in room; pt said she lives at home; she plans to return w/ family support at d/c; she identified POC son Kathy [name] [phone]); he will provide transportation; insurance/PCP verified; she denied SDOH risks; pt has cane and Rollator; she does not have HH services or home oxygen; pt agreed to receive nebulizer; she does not have an agency preference; referral given to Jermaine at [place]; he said DME will be delivered to pt's room before d/c; pt notified; agency contact info placed in follow up provider section of discharge instructions; no IP CM needs.  -[phone]- notified by Jermaine, nebulizer will be delivered to pt's home. Expected Discharge Plan: Home/Self Care Barriers to Discharge: No Barriers Identified   Patient Goals and CMS Choice Patient states their goals for this hospitalization and ongoing recovery are:: home     [place] ownership interest in [facility].provided to:: Patient    Expected Discharge Plan and Services         Expected Discharge Date: 09/14/24               DME Arranged: Nebulizer machine DME Agency: [employer] Date DME Agency Contacted: 09/14/24 Time DME Agency Contacted: 1345 Representative spoke with at DME Agency: [employer] HH Arranged: NA HH Agency: NA        Prior Living Arrangements/Services   Lives with:: Self Patient language and need for interpreter reviewed:: Yes Do you feel safe going back to the place where you live?: Yes      Need for Family Participation in Patient Care: Yes (Comment) Care giver support system in place?: Yes (comment) Current home services: DME (cane,  Rollator) Criminal Activity/Legal Involvement Pertinent to Current Situation/Hospitalization: No - Comment as needed  Activities of Daily Living   ADL Screening (condition at time of admission) Independently performs ADLs?: Yes (appropriate for developmental age) Is the patient deaf or have difficulty hearing?: No Does the patient have difficulty seeing, even when wearing glasses/contacts?: No Does the patient have difficulty concentrating, remembering, or making decisions?: No  Permission Sought/Granted Permission sought to share information with : Case Manager Permission granted to share information with : Yes, Verbal Permission Granted  Share Information with NAME: Case Manager     Permission granted to share info w Relationship: Lorraina Spring (son) [phone]     Emotional Assessment Appearance:: Appears stated age Attitude/Demeanor/Rapport: Gracious Affect (typically observed): Accepting Orientation: : Oriented to Self, Oriented to Place, Oriented to  Time, Oriented to Situation Alcohol  / Substance Use: Not Applicable Psych Involvement: No (comment)  Admission diagnosis:  Acute respiratory failure (HCC) [J96.00] Influenza A [J10.1] Acute respiratory failure with hypoxia (HCC) [J96.01] Patient Active Problem List   Diagnosis Date Noted   Acute respiratory failure (HCC) 09/10/2024   Acute respiratory failure with hypoxia (HCC) 09/10/2024   Hypokalemia 09/10/2024   Vitamin D  deficiency 11/15/2022   Acute on chronic diastolic (congestive) heart failure (HCC) 07/26/2022   RUQ abdominal pain 07/26/2022   Dizziness 07/21/2022   Chronic pain disorder 07/21/2022   CKD (chronic kidney disease) stage 3, GFR 30-59 ml/min (HCC) 10/12/2021   Chronic anticoagulation 08/30/2021  OSA on CPAP    Depression, recurrent 05/06/2019   Persistent atrial fibrillation (HCC) 12/31/2018   Chronic diastolic CHF (congestive heart failure) (HCC) 12/31/2018   Bilateral leg edema 07/29/2018    Paroxysmal atrial fibrillation (HCC) 06/25/2018   Chest pain 06/25/2018   Anxiety and depression 06/25/2018   Difficulty walking 02/13/2017   Low back pain 02/13/2017   Allergic rhinitis 07/31/2016   Malignant melanoma (HCC) 08/31/2013   Other abnormal glucose 01/22/2013   Anemia, unspecified 01/22/2013   OA (osteoarthritis) of hip 03/18/2012   Personal history of DVT (deep vein thrombosis) 01/05/2012   History of colonic polyps 08/08/2011   ARTHRITIS, HIP 01/24/2010   PERSONAL HISTORY OF FAILED MODERATE SEDATION 07/21/2009   Morbid obesity (HCC) 04/05/2009   Esophageal reflux 02/25/2009   HEART VALVE DISEASE 12/28/2008   HEADACHE 04/21/2008   Essential hypertension 10/15/2007   LUMBAR RADICULOPATHY, RIGHT 10/15/2007   FATIGUE 10/15/2007   IRRITABLE BOWEL SYNDROME 07/23/2007   Fibromyalgia 07/23/2007   PCP:  Theophilus Delma Tully CINDERELLA, MD Pharmacy:   [facility] [phone] - [place], [place] - [address] [place] [address] Phone: [phone] Fax: [phone]     Social Drivers of Health (SDOH) Social History: SDOH Screenings   Food Insecurity: No Food Insecurity (09/14/2024)  Housing: Low Risk (09/14/2024)  Transportation Needs: No Transportation Needs (09/14/2024)  Utilities: Not At Risk (09/14/2024)  Alcohol  Screen: Low Risk (11/20/2023)  Depression (PHQ2-9): Low Risk (11/20/2023)  Recent Concern: Depression (PHQ2-9) - High Risk (08/29/2023)  Financial Resource Strain: Low Risk (11/20/2023)  Physical Activity: Inactive (11/20/2023)  Social Connections: Moderately Integrated (09/10/2024)  Stress: Stress Concern Present (11/20/2023)  Tobacco Use: Low Risk (09/11/2024)  Health Literacy: Adequate Health Literacy (11/20/2023)   SDOH Interventions: Food Insecurity Interventions: Intervention Not Indicated, Inpatient TOC Housing Interventions: Intervention Not Indicated, Inpatient TOC Transportation Interventions:  Intervention Not Indicated, Inpatient TOC Utilities Interventions: Intervention Not Indicated, Inpatient TOC   Readmission Risk Interventions     No data to display

## 2024-09-14 NOTE — Progress Notes (Signed)
 IV line not working. Currently no IV fluids/meds. No IV line present. On call NP aware.

## 2024-09-14 NOTE — Discharge Summary (Signed)
 " Physician Discharge Summary   Patient: Kathy [name] MRN: [id number] DOB: 1951-01-03  Admit date:     09/10/2024  Discharge date: 09/14/2024  Discharge Physician: Owen DELENA Lore   PCP: Theophilus Andrews, Tully GRADE, MD   Recommendations at discharge:    Follow resolution Influenza. LFT in 1 week.  Evaluation of anemia  Discharge Diagnoses: Principal Problem:   Acute respiratory failure with hypoxia (HCC) Active Problems:   Chronic diastolic CHF (congestive heart failure) (HCC)   CKD (chronic kidney disease) stage 3, GFR 30-59 ml/min (HCC)   Hypokalemia   Morbid obesity (HCC)   Paroxysmal atrial fibrillation (HCC)   Essential hypertension   Esophageal reflux   Anxiety and depression  Resolved Problems:   * No resolved hospital problems. *  Hospital Course: Kathy [name] is a 73 y.o. female with past medical history significant for chronic diastolic congestive heart failure, paroxysmal atrial fibrillation on Eliquis , HTN, pulmonary HTN, CKD stage IIIb, anxiety/depression, GERD, obesity who presented to [facility] on 09/10/2024 with progressive shortness of breath, cough, sore throat and congestion.  Ongoing over the last few days.  Also endorses fever and chills.  Cough productive of clear sputum.  When family arrived to patient's home they found her in respiratory distress with portable pulse oximeter with SpO2 between 79-84%.  Husband then transported patient to the ED for further evaluation and management.  No history of asthma or COPD.   In the ED, temperature 100.8 F, HR 71, RR 17, BP 122/43, SpO2 88% on 3 L nasal cannula.  WBC 4.8, hemoglobin 10.3, platelet count 253.  Sodium 132, potassium 3.3, chloride 94, CO2 25, glucose 211, BUN 33, creat 1.62.  VBG with pH 7.356, pCO2 49.8, PaO2 41.  Influenza A PCR positive.  Respiratory syncytial virus/COVID-19 PCR negative.  Chest x-ray with no acute abnormality noted.  Patient received multiple nebulized  treatments, IV Solu-Medrol , IV magnesium  in the ED.  TRH was consulted and patient was transferred to [facility] for further evaluation and management.    Assessment and Plan: Acute respiratory failure with hypoxia, POA Influenza A viral infection Patient presenting to ED after being found hypoxic by family members with SpO2 79-84% at home.  Patient endorses progressive shortness of breath with associated fever/chills, cough over the last 3 days.  Was given multiple nebulizer treatment, IV Solu-Medrol  and requiring 4 L nasal cannula maintaining SpO2. -- Tamiflu  30 mg p.o. twice daily -- Discontinue prednisone  due to elevation of LFTs -- DuoNebs BID -- Albuterol  neb every 4 hours as needed wheezing/shortness of breath -- Mucinex  DM twice daily -- Tussionex every 12 hours as needed cough Currently room air, stable for discharge.  -- Incentive spirometry, flutter valve  stable.   Transaminitis Denies any abdominal discomfort, suspect etiology likely secondary to acute infectious process versus prednisone  use. -- Avoid hepatotoxins -- Discontinue prednisone  today Improving.    Hypokalemia Repleted.  Chronic diastolic congestive heart failure, compensated HTN -- BP improving -- Resume Entresto   Paroxysmal atrial fibrillation -- Amiodarone  100 mg p.o. daily -- Eliquis  5 g p.o. twice daily   HLD -- Atorvastatin  40 g p.o. daily   Anxiety/depression -- Fluoxetine  40 mg p.o. daily -- Alprazolam  1 mg p.o. 3 times daily as needed anxiety   GERD -- Protonix  40 mg p.o. daily   Obesity, class III Body mass index is 45.16 kg/m.   Weakness/debility/deconditioning: -- PT evaluation with no needs identified on 12/27  Consultants: None Procedures performed: none Disposition: Home Diet recommendation:  Cardiac diet DISCHARGE MEDICATION: Allergies as of 09/14/2024       Reactions   Achromycin [tetracycline Hcl] Shortness Of Breath   Dyspnea   Penicillins     Facial angioedema Did it involve swelling of the face/tongue/throat, SOB, or low BP? Yes Did it involve sudden or severe rash/hives, skin peeling, or any reaction on the inside of your mouth or nose? No Did you need to seek medical attention at a hospital or doctor's office? No When did it last happen?      30+ years If all above answers are NO, may proceed with cephalosporin use.   Sulfamethoxazole-trimethoprim Hives   Nickel Itching   Oxycodone  Itching   Zanaflex [tizanidine Hydrochloride] Other (See Comments)   Causes more pain         Medication List     STOP taking these medications    azelastine  0.1 % nasal spray Commonly known as: ASTELIN    cyclobenzaprine  5 MG tablet Commonly known as: FLEXERIL    predniSONE  20 MG tablet Commonly known as: DELTASONE        TAKE these medications    albuterol  108 (90 Base) MCG/ACT inhaler Commonly known as: VENTOLIN  HFA Inhale 2 puffs into the lungs every 6 (six) hours as needed for wheezing or shortness of breath.   ALPRAZolam  1 MG tablet Commonly known as: XANAX  TAKE 1 TABLET BY MOUTH THREE TIMES A DAY AS NEEDED FOR ANXIETY   amiodarone  100 MG tablet Commonly known as: PACERONE  Take 1 tablet (100 mg total) by mouth daily.   apixaban  5 MG Tabs tablet Commonly known as: Eliquis  Take 1 tablet (5 mg total) by mouth 2 (two) times daily.   atorvastatin  40 MG tablet Commonly known as: LIPITOR Take 1 tablet (40 mg total) by mouth daily.   CALCIUM  600 + D PO Take 600 mg by mouth daily.   cetirizine  10 MG tablet Commonly known as: ZYRTEC  TAKE 1 TABLET BY MOUTH EVERY DAY   chlorpheniramine-HYDROcodone  10-8 MG/5ML Commonly known as: TUSSIONEX Take 5 mLs by mouth at bedtime as needed.   clindamycin 1 % lotion Commonly known as: CLEOCIN T Apply 1 applicator topically 2 (two) times daily as needed.   clindamycin 1 % external solution Commonly known as: CLEOCIN T Apply 1 Application topically 2 (two) times daily.    dapagliflozin  propanediol 10 MG Tabs tablet Commonly known as: FARXIGA  Take 1 tablet (10 mg total) by mouth daily.   dextromethorphan -guaiFENesin  30-600 MG 12hr tablet Commonly known as: MUCINEX  DM Take 1 tablet by mouth 2 (two) times daily.   Entresto  97-103 MG Generic drug: sacubitril -valsartan  Take 1 tablet by mouth 2 (two) times daily.   fluocinonide 0.05 % external solution Commonly known as: LIDEX Apply 1 application topically daily as needed (rosacea).   FLUoxetine  40 MG capsule Commonly known as: PROZAC  TAKE 1 CAPSULE BY MOUTH EVERY DAY   fluticasone  50 MCG/ACT nasal spray Commonly known as: FLONASE  SPRAY 2 SPRAYS INTO EACH NOSTRIL EVERY DAY   furosemide  40 MG tablet Commonly known as: LASIX  Take 1 tablet (40 mg total) by mouth as needed. For weight gain of 3 lbs in 24 hours or 5 lbs in a week   gabapentin  300 MG capsule Commonly known as: NEURONTIN  TAKE 1 CAPSULE BY MOUTH EVERYDAY AT BEDTIME   ipratropium-albuterol  0.5-2.5 (3) MG/3ML Soln Commonly known as: DUONEB Take 3 mLs by nebulization 2 (two) times daily.   methocarbamol  750 MG tablet Commonly known  as: ROBAXIN  Take 750 mg by mouth 3 (three) times daily as needed for muscle spasms.   metroNIDAZOLE  0.75 % cream Commonly known as: METROCREAM  Apply 1 application topically 2 (two) times daily as needed (rosacea).   multivitamin with minerals Tabs tablet Take 1 tablet by mouth daily. Centrum Silver   nitroGLYCERIN  0.4 MG SL tablet Commonly known as: NITROSTAT  Place 1 tablet (0.4 mg total) under the tongue every 5 (five) minutes as needed for chest pain.   omeprazole  40 MG capsule Commonly known as: PRILOSEC TAKE 1 CAPSULE BY MOUTH EVERY DAY   oseltamivir  75 MG capsule Commonly known as: TAMIFLU  Take 1 capsule (75 mg total) by mouth 2 (two) times daily for 2 days.   potassium chloride  10 MEQ tablet Commonly known as: KLOR-CON  Take 2 tablets (20 mEq total) by mouth as needed. For weight gain of 3  lbs in 24 hours or 5 lbs in a week   Systane Balance 0.6 % Soln Generic drug: Propylene Glycol Place 1 drop into both eyes daily as needed (Dry eyes).        Follow-up Information     Theophilus Andrews, Tully GRADE, MD Follow up in 1 week(s).   Specialty: Internal Medicine Contact information: [address] [place] [address] [phone]                Discharge Exam: Filed Weights   09/11/24 0500 09/12/24 0500 09/13/24 0320  Weight: 126.8 kg 129 kg 126.9 kg   General; NAD  Condition at discharge: stable  The results of significant diagnostics from this hospitalization (including imaging, microbiology, ancillary and laboratory) are listed below for reference.   Imaging Studies: DG Chest Portable 1 View Result Date: 09/10/2024 CLINICAL DATA:  Shortness of breath EXAM: PORTABLE CHEST 1 VIEW COMPARISON:  Yesterday FINDINGS: Stable cardiomediastinal silhouette. No acute pulmonary disease is noted. Bony thorax is unremarkable. IMPRESSION: No acute abnormality seen. Electronically Signed   By: Lynwood Landy Raddle M.D.   On: 09/10/2024 15:05    Microbiology: Results for orders placed or performed during the hospital encounter of 09/10/24  Resp panel by RT-PCR (RSV, Flu A&B, Covid) Anterior Nasal Swab     Status: Abnormal   Collection Time: 09/10/24  1:35 PM   Specimen: Anterior Nasal Swab  Result Value Ref Range Status   SARS Coronavirus 2 by RT PCR NEGATIVE NEGATIVE Final    Comment: (NOTE) SARS-CoV-2 target nucleic acids are NOT DETECTED.  The SARS-CoV-2 RNA is generally detectable in upper respiratory specimens during the acute phase of infection. The lowest concentration of SARS-CoV-2 viral copies this assay can detect is 138 copies/mL. A negative result does not preclude SARS-Cov-2 infection and should not be used as the sole basis for treatment or other patient management decisions. A negative result may occur with  improper specimen collection/handling,  submission of specimen other than nasopharyngeal swab, presence of viral mutation(s) within the areas targeted by this assay, and inadequate number of viral copies(<138 copies/mL). A negative result must be combined with clinical observations, patient history, and epidemiological information. The expected result is Negative.  Fact Sheet for Patients:  bloggercourse.com  Fact Sheet for Healthcare Providers:  seriousbroker.it  This test is no t yet approved or cleared by the [place]  FDA and  has been authorized for detection and/or diagnosis of SARS-CoV-2 by FDA under an Emergency Use Authorization (EUA). This EUA will remain  in effect (meaning this test can be used) for the duration of the COVID-19 declaration  under Section 564(b)(1) of the Act, 21 U.S.C.section 360bbb-3(b)(1), unless the authorization is terminated  or revoked sooner.       Influenza A by PCR POSITIVE (A) NEGATIVE Final   Influenza B by PCR NEGATIVE NEGATIVE Final    Comment: (NOTE) The Xpert Xpress SARS-CoV-2/FLU/RSV plus assay is intended as an aid in the diagnosis of influenza from Nasopharyngeal swab specimens and should not be used as a sole basis for treatment. Nasal washings and aspirates are unacceptable for Xpert Xpress SARS-CoV-2/FLU/RSV testing.  Fact Sheet for Patients: bloggercourse.com  Fact Sheet for Healthcare Providers: seriousbroker.it  This test is not yet approved or cleared by the [place]  FDA and has been authorized for detection and/or diagnosis of SARS-CoV-2 by FDA under an Emergency Use Authorization (EUA). This EUA will remain in effect (meaning this test can be used) for the duration of the COVID-19 declaration under Section 564(b)(1) of the Act, 21 U.S.C. section 360bbb-3(b)(1), unless the authorization is terminated or revoked.     Resp Syncytial Virus by PCR  NEGATIVE NEGATIVE Final    Comment: (NOTE) Fact Sheet for Patients: bloggercourse.com  Fact Sheet for Healthcare Providers: seriousbroker.it  This test is not yet approved or cleared by the [place]  FDA and has been authorized for detection and/or diagnosis of SARS-CoV-2 by FDA under an Emergency Use Authorization (EUA). This EUA will remain in effect (meaning this test can be used) for the duration of the COVID-19 declaration under Section 564(b)(1) of the Act, 21 U.S.C. section 360bbb-3(b)(1), unless the authorization is terminated or revoked.  Performed at [facility], [address], [place], [place] [address]   Blood culture (routine x 2)     Status: None (Preliminary result)   Collection Time: 09/10/24  5:25 PM   Specimen: BLOOD  Result Value Ref Range Status   Specimen Description   Final    BLOOD RIGHT ANTECUBITAL Performed at [facility], [address] [place], [place] [address]    Special Requests   Final    BOTTLES DRAWN AEROBIC AND ANAEROBIC Blood Culture adequate volume Performed at [facility], [address] [place], [place] [address]    Culture   Final    NO GROWTH 4 DAYS Performed at [facility], [address], [place], [place] [address]    Report Status PENDING  Incomplete  Blood culture (routine x 2)     Status: None (Preliminary result)   Collection Time: 09/10/24  5:35 PM   Specimen: BLOOD RIGHT FOREARM  Result Value Ref Range Status   Specimen Description   Final    BLOOD RIGHT FOREARM Performed at [facility], [address] [place], [place] [address]    Special Requests   Final    BOTTLES DRAWN AEROBIC AND ANAEROBIC Blood Culture adequate volume Performed at [facility], [address], [place], [place] [address]    Culture   Final    NO GROWTH 4 DAYS Performed at [facility],  [address], [place], [place] [address]    Report Status PENDING  Incomplete    Labs: CBC: Recent Labs  Lab 09/10/24 1335 09/10/24 1337 09/11/24 0515 09/14/24 0542  WBC 4.8  --  4.2 4.4  NEUTROABS 3.8  --   --   --   HGB 10.3* 11.6* 9.5* 9.6*  HCT 32.2* 34.0* 30.0* 30.4*  MCV 83.6  --  83.3 85.2  PLT 253  --  171 157  Basic Metabolic Panel: Recent Labs  Lab 09/10/24 1337 09/10/24 1443 09/11/24 0515 09/12/24 0458 09/14/24 0542  NA 133* 132* 131* 133* 135  K 3.8 3.3* 4.6 5.0 4.6  CL  --  94* 96* 98 99  CO2  --  25 25 28 28   GLUCOSE  --  211* 137* 104* 91  BUN  --  33* 32* 32* 22  CREATININE  --  1.62* 1.29* 1.07* 1.19*  CALCIUM   --  8.7* 8.7* 8.7* 9.0  MG  --   --  2.6* 2.6*  --    Liver Function Tests: Recent Labs  Lab 09/10/24 1443 09/12/24 0458 09/14/24 0542  AST 67* 124* 54*  ALT 51* 116* 81*  ALKPHOS 88 76 73  BILITOT 0.4 0.3 0.4  PROT 6.4* 6.0* 5.9*  ALBUMIN  4.0 3.5 3.5   CBG: Recent Labs  Lab 09/13/24 0727 09/13/24 1130 09/13/24 1605 09/14/24 0741 09/14/24 1128  GLUCAP 85 88 138* 84 83    Discharge time spent: greater than 30 minutes.  Signed: Owen DELENA Lore, MD [facility] 09/14/2024 "

## 2024-09-15 ENCOUNTER — Telehealth: Payer: Self-pay

## 2024-09-15 LAB — CULTURE, BLOOD (ROUTINE X 2)
Culture: NO GROWTH
Culture: NO GROWTH
Special Requests: ADEQUATE
Special Requests: ADEQUATE

## 2024-09-15 NOTE — Transitions of Care (Post Inpatient/ED Visit) (Signed)
 "  09/15/2024  Name: Kathy [name] MRN: [id number] DOB: 1951/01/14  Today's TOC FU Call Status: Today's TOC FU Call Status:: Successful TOC FU Call Completed TOC FU Call Complete Date: 09/15/24  Patient's Name and Date of Birth confirmed. Name, DOB  Transition Care Management Follow-up Telephone Call Date of Discharge: 09/14/24 Discharge Facility: [facility]) Type of Discharge: Inpatient Admission Primary Inpatient Discharge Diagnosis:: infuenza How have you been since you were released from the hospital?: Same  Items Reviewed: Did you receive and understand the discharge instructions provided?: Yes Medications obtained,verified, and reconciled?: Yes (Medications Reviewed) Any new allergies since your discharge?: No Dietary orders reviewed?: Yes Do you have support at home?: Yes People in Home [RPT]: child(ren), adult  Medications Reviewed Today: Medications Reviewed Today     Reviewed by Emmitt Pan, LPN (Licensed Practical Nurse) on 09/15/24 at 1120  Med List Status: <None>   Medication Order Taking? Sig Documenting Provider Last Dose Status Informant  albuterol  (VENTOLIN  HFA) 108 (90 Base) MCG/ACT inhaler [id number] Yes Inhale 2 puffs into the lungs every 6 (six) hours as needed for wheezing or shortness of breath. Jordan, Betty G, MD  Active Self           Med Note LEONARDO, [facility]   Wed Sep 10, 2024  3:47 PM) Just picked up script last night  ALPRAZolam  (XANAX ) 1 MG tablet [id number] Yes TAKE 1 TABLET BY MOUTH THREE TIMES A DAY AS NEEDED FOR ANXIETY Theophilus Andrews, Tully GRADE, MD  Active Self  amiodarone  (PACERONE ) 100 MG tablet [id number] Yes Take 1 tablet (100 mg total) by mouth daily. Felton, Harlene HERO, [place]  Active Self  apixaban  (ELIQUIS ) 5 MG TABS tablet [id number] Yes Take 1 tablet (5 mg total) by mouth 2 (two) times daily. Sabharwal, Aditya, DO  Active Self  atorvastatin  (LIPITOR) 40 MG tablet [id number] Yes Take 1 tablet (40 mg total) by mouth daily.  Sabharwal, Aditya, DO  Active Self  Calcium  Carb-Cholecalciferol (CALCIUM  600 + D PO) [id number] Yes Take 600 mg by mouth daily. [provider]  Active Self  cetirizine  (ZYRTEC ) 10 MG tablet [id number] Yes TAKE 1 TABLET BY MOUTH EVERY DAY Theophilus Andrews, Tully GRADE, MD  Active Self  chlorpheniramine-HYDROcodone  (TUSSIONEX) 10-8 MG/5ML [id number] Yes Take 5 mLs by mouth at bedtime as needed. Theophilus Andrews, Tully GRADE, MD  Active Self  clindamycin (CLEOCIN T) 1 % external solution [id number] Yes Apply 1 Application topically 2 (two) times daily. [provider]  Active Self  clindamycin (CLEOCIN T) 1 % lotion [id number] Yes Apply 1 applicator topically 2 (two) times daily as needed. [provider]  Active Self  dapagliflozin  propanediol (FARXIGA ) 10 MG TABS tablet [id number] Yes Take 1 tablet (10 mg total) by mouth daily. Colletta Manuelita Garre, PA-C  Active Self           Med Note LEONARDO, SUZEN CROME   Wed Sep 10, 2024  3:49 PM) Has script but hasn't taken any yet  dextromethorphan -guaiFENesin  (MUCINEX  DM) 30-600 MG 12hr tablet [id number] Yes Take 1 tablet by mouth 2 (two) times daily. Regalado, Belkys A, MD  Active   fluocinonide (LIDEX) 0.05 % external solution [id number] Yes Apply 1 application topically daily as needed (rosacea). [provider]  Active Self  FLUoxetine  (PROZAC ) 40 MG capsule [id number] Yes TAKE 1 CAPSULE BY MOUTH EVERY DAY Theophilus Andrews, Tully GRADE, MD  Active Self  fluticasone  (FLONASE ) 50 MCG/ACT nasal spray [id number] Yes SPRAY 2 SPRAYS INTO EACH NOSTRIL EVERY DAY  Theophilus Andrews, Tully GRADE, MD  Active Self  furosemide  (LASIX ) 40 MG tablet [id number] Yes Take 1 tablet (40 mg total) by mouth as needed. For weight gain of 3 lbs in 24 hours or 5 lbs in a week Shelby, [place]  Active Self  gabapentin  (NEURONTIN ) 300 MG capsule [id number] Yes TAKE 1 CAPSULE BY MOUTH EVERYDAY AT BEDTIME Theophilus Andrews, Tully GRADE, MD  Active Self   ipratropium-albuterol  (DUONEB) 0.5-2.5 (3) MG/3ML SOLN [id number] Yes Take 3 mLs by nebulization 2 (two) times daily. Regalado, Belkys A, MD  Active   methocarbamol  (ROBAXIN ) 750 MG tablet [id number] Yes Take 750 mg by mouth 3 (three) times daily as needed for muscle spasms. [provider]  Active Self  metroNIDAZOLE  (METROCREAM ) 0.75 % cream [id number] Yes Apply 1 application topically 2 (two) times daily as needed (rosacea).  [provider]  Active Self  Multiple Vitamin (MULTIVITAMIN WITH MINERALS) TABS tablet [id number] Yes Take 1 tablet by mouth daily. Centrum Silver [provider]  Active Self  nitroGLYCERIN  (NITROSTAT ) 0.4 MG SL tablet [id number] Yes Place 1 tablet (0.4 mg total) under the tongue every 5 (five) minutes as needed for chest pain. Theophilus Andrews, Tully GRADE, MD  Active Self           Med Note LEONARDO, [facility]   Wed Sep 10, 2024  3:51 PM) Has but hasn't had to take  omeprazole  (PRILOSEC) 40 MG capsule [id number] Yes TAKE 1 CAPSULE BY MOUTH EVERY DAY Theophilus Andrews, Tully GRADE, MD  Active Self  oseltamivir  (TAMIFLU ) 75 MG capsule [id number] Yes Take 1 capsule (75 mg total) by mouth 2 (two) times daily for 2 days. Regalado, Belkys A, MD  Active   potassium chloride  (KLOR-CON ) 10 MEQ tablet [id number]  Take 2 tablets (20 mEq total) by mouth as needed. For weight gain of 3 lbs in 24 hours or 5 lbs in a week  Patient not taking: Reported on 09/15/2024   Glena Harlene HERO, FNP  Active Self  Propylene Glycol (SYSTANE BALANCE) 0.6 % SOLN [id number] Yes Place 1 drop into both eyes daily as needed (Dry eyes). [provider]  Active Self  sacubitril -valsartan  (ENTRESTO ) 97-103 MG [id number] Yes Take 1 tablet by mouth 2 (two) times daily. Lenetta Greig BIRCH, NP  Active Self            Home Care and Equipment/Supplies: Were Home Health Services Ordered?: NA Any new equipment or medical supplies ordered?: No  Functional Questionnaire: Do you need  assistance with bathing/showering or dressing?: No Do you need assistance with meal preparation?: No Do you need assistance with eating?: No Do you have difficulty maintaining continence: No Do you need assistance with getting out of bed/getting out of a chair/moving?: No Do you have difficulty managing or taking your medications?: No  Follow up appointments reviewed: PCP Follow-up appointment confirmed?: Yes Date of PCP follow-up appointment?: 09/25/24 Follow-up Provider: [facility] Follow-up appointment confirmed?: NA Do you need transportation to your follow-up appointment?: No Do you understand care options if your condition(s) worsen?: Yes-patient verbalized understanding    SIGNATURE Julian Lemmings, LPN [facility] Nurse Health Advisor Direct Dial [phone]  "

## 2024-09-16 ENCOUNTER — Ambulatory Visit: Payer: Self-pay

## 2024-09-16 ENCOUNTER — Other Ambulatory Visit: Payer: Self-pay | Admitting: Internal Medicine

## 2024-09-16 DIAGNOSIS — J189 Pneumonia, unspecified organism: Secondary | ICD-10-CM

## 2024-09-16 NOTE — Telephone Encounter (Signed)
 Converted to Triage Encounter

## 2024-09-16 NOTE — Telephone Encounter (Signed)
Duplicate see prior note.

## 2024-09-16 NOTE — Telephone Encounter (Signed)
 FYI Only or Action Required?: Action required by provider: update on patient condition and refill request of Tussionex.  Patient was last seen in primary care on 09/09/2024 by Jordan, Betty G, MD.  Called Nurse Triage reporting Cough.  Symptoms began several weeks ago.  Interventions attempted: Prescription medications: tussionex, tamiflu , nebulizer.  Symptoms are: gradually improving.  Triage Disposition: Call PCP When Office is Open  Patient/caregiver understands and will follow disposition?: Yes  Copied from CRM [id number]. Topic: Clinical - Medication Refill >> Sep 16, 2024 10:03 AM Deleta RAMAN wrote: Medication: chlorpheniramine-HYDROcodone  (TUSSIONEX) 10-8 MG/5ML   Reason for Disposition  Caller requesting a CONTROLLED substance prescription refill (e.g., narcotics, ADHD medicines)  Answer Assessment - Initial Assessment Questions Patient with continued cough for last 2 months. Was recently admitted 12/24-12/28 for hypoxia. Since discharge home she has started nebulizer treatments which have been beneficial and she is on her last day of Tamiflu .  Requesting refill of Tussionex to help her cough. Has HFU appt 1/8 with PCP.  1. DRUG NAME: What medicine do you need to have refilled?     Tussionex 2. REFILLS REMAINING: How many refills are remaining? Notes: The label on the medicine or pill bottle will show how many refills are remaining. If there are no refills remaining, then a renewal may be needed.     0  3. EXPIRATION DATE: What is the expiration date? Note: The label states when the prescription will expire, and thus can no longer be refilled.)     03/01/25 4. PRESCRIBER: Who prescribed it? Note: The prescribing doctor or group is responsible for refill approvals..     Dr Theophilus 5. PHARMACY: Have you contacted your pharmacy (drugstore)? Note: Some pharmacies will contact the doctor (or NP/PA).      [id number] 6. SYMPTOMS: Do you have any symptoms?     Hoarse  voice, SOB, cough-- all better than they were prior to inpatient admission  Protocols used: Medication Refill and Renewal Call-A-AH

## 2024-09-16 NOTE — Telephone Encounter (Unsigned)
 Copied from CRM [id number]. Topic: Clinical - Medication Refill >> Sep 16, 2024 10:03 AM Deleta RAMAN wrote: Medication: chlorpheniramine-HYDROcodone  (TUSSIONEX) 10-8 MG/5ML  Has the patient contacted their pharmacy? Yes (Agent: If no, request that the patient contact the pharmacy for the refill. If patient does not wish to contact the pharmacy document the reason why and proceed with request.) (Agent: If yes, when and what did the pharmacy advise?)  This is the patient's preferred pharmacy:  [id number] - [place], [place] - [address] [place] [address] Phone: [phone] Fax: [phone]  Is this the correct pharmacy for this prescription? Yes If no, delete pharmacy and type the correct one.   Has the prescription been filled recently? Yes  Is the patient out of the medication? Yes  Has the patient been seen for an appointment in the last year OR does the patient have an upcoming appointment? Yes  Can we respond through MyChart? Yes  Agent: Please be advised that Rx refills may take up to 3 business days. We ask that you follow-up with your pharmacy.

## 2024-09-17 MED ORDER — HYDROCOD POLI-CHLORPHE POLI ER 10-8 MG/5ML PO SUER: 5.0000 mL | Freq: Every evening | ORAL | 0 refills | Status: AC | PRN

## 2024-09-19 ENCOUNTER — Other Ambulatory Visit: Payer: Self-pay | Admitting: Internal Medicine

## 2024-09-19 DIAGNOSIS — F419 Anxiety disorder, unspecified: Secondary | ICD-10-CM

## 2024-09-25 ENCOUNTER — Emergency Department (HOSPITAL_COMMUNITY)

## 2024-09-25 ENCOUNTER — Encounter: Payer: Self-pay | Admitting: Internal Medicine

## 2024-09-25 ENCOUNTER — Other Ambulatory Visit: Payer: Self-pay

## 2024-09-25 ENCOUNTER — Observation Stay (HOSPITAL_COMMUNITY)
Admission: EM | Admit: 2024-09-25 | Discharge: 2024-09-27 | Disposition: A | Attending: Emergency Medicine | Admitting: Emergency Medicine

## 2024-09-25 ENCOUNTER — Ambulatory Visit: Admitting: Internal Medicine

## 2024-09-25 VITALS — BP 124/80 | HR 70 | Temp 97.7°F | Wt 282.5 lb

## 2024-09-25 DIAGNOSIS — Z7984 Long term (current) use of oral hypoglycemic drugs: Secondary | ICD-10-CM | POA: Diagnosis not present

## 2024-09-25 DIAGNOSIS — J9601 Acute respiratory failure with hypoxia: Secondary | ICD-10-CM | POA: Insufficient documentation

## 2024-09-25 DIAGNOSIS — D649 Anemia, unspecified: Secondary | ICD-10-CM | POA: Diagnosis not present

## 2024-09-25 DIAGNOSIS — I480 Paroxysmal atrial fibrillation: Secondary | ICD-10-CM | POA: Diagnosis present

## 2024-09-25 DIAGNOSIS — N183 Chronic kidney disease, stage 3 unspecified: Secondary | ICD-10-CM | POA: Diagnosis present

## 2024-09-25 DIAGNOSIS — Z96643 Presence of artificial hip joint, bilateral: Secondary | ICD-10-CM | POA: Insufficient documentation

## 2024-09-25 DIAGNOSIS — K219 Gastro-esophageal reflux disease without esophagitis: Secondary | ICD-10-CM | POA: Insufficient documentation

## 2024-09-25 DIAGNOSIS — F419 Anxiety disorder, unspecified: Secondary | ICD-10-CM | POA: Diagnosis present

## 2024-09-25 DIAGNOSIS — F418 Other specified anxiety disorders: Secondary | ICD-10-CM | POA: Insufficient documentation

## 2024-09-25 DIAGNOSIS — R0602 Shortness of breath: Secondary | ICD-10-CM | POA: Diagnosis present

## 2024-09-25 DIAGNOSIS — Z09 Encounter for follow-up examination after completed treatment for conditions other than malignant neoplasm: Secondary | ICD-10-CM

## 2024-09-25 DIAGNOSIS — I130 Hypertensive heart and chronic kidney disease with heart failure and stage 1 through stage 4 chronic kidney disease, or unspecified chronic kidney disease: Secondary | ICD-10-CM | POA: Insufficient documentation

## 2024-09-25 DIAGNOSIS — G894 Chronic pain syndrome: Secondary | ICD-10-CM | POA: Diagnosis not present

## 2024-09-25 DIAGNOSIS — Z79899 Other long term (current) drug therapy: Secondary | ICD-10-CM | POA: Insufficient documentation

## 2024-09-25 DIAGNOSIS — Z86718 Personal history of other venous thrombosis and embolism: Secondary | ICD-10-CM | POA: Diagnosis not present

## 2024-09-25 DIAGNOSIS — I10 Essential (primary) hypertension: Secondary | ICD-10-CM | POA: Diagnosis present

## 2024-09-25 DIAGNOSIS — N1831 Chronic kidney disease, stage 3a: Secondary | ICD-10-CM | POA: Diagnosis not present

## 2024-09-25 DIAGNOSIS — J101 Influenza due to other identified influenza virus with other respiratory manifestations: Secondary | ICD-10-CM

## 2024-09-25 DIAGNOSIS — I5031 Acute diastolic (congestive) heart failure: Secondary | ICD-10-CM | POA: Diagnosis not present

## 2024-09-25 DIAGNOSIS — R7401 Elevation of levels of liver transaminase levels: Secondary | ICD-10-CM | POA: Insufficient documentation

## 2024-09-25 DIAGNOSIS — G4733 Obstructive sleep apnea (adult) (pediatric): Secondary | ICD-10-CM | POA: Insufficient documentation

## 2024-09-25 DIAGNOSIS — Z7901 Long term (current) use of anticoagulants: Secondary | ICD-10-CM | POA: Diagnosis not present

## 2024-09-25 DIAGNOSIS — E785 Hyperlipidemia, unspecified: Secondary | ICD-10-CM | POA: Diagnosis not present

## 2024-09-25 DIAGNOSIS — I5033 Acute on chronic diastolic (congestive) heart failure: Secondary | ICD-10-CM | POA: Diagnosis not present

## 2024-09-25 DIAGNOSIS — Z85828 Personal history of other malignant neoplasm of skin: Secondary | ICD-10-CM | POA: Insufficient documentation

## 2024-09-25 DIAGNOSIS — R0600 Dyspnea, unspecified: Principal | ICD-10-CM

## 2024-09-25 LAB — CBC WITH DIFFERENTIAL/PLATELET
Abs Immature Granulocytes: 0.04 K/uL (ref 0.00–0.07)
Basophils Absolute: 0 K/uL (ref 0.0–0.1)
Basophils Relative: 0 %
Eosinophils Absolute: 0.1 K/uL (ref 0.0–0.5)
Eosinophils Relative: 2 %
HCT: 34 % — ABNORMAL LOW (ref 36.0–46.0)
Hemoglobin: 10.4 g/dL — ABNORMAL LOW (ref 12.0–15.0)
Immature Granulocytes: 1 %
Lymphocytes Relative: 8 %
Lymphs Abs: 0.7 K/uL (ref 0.7–4.0)
MCH: 26 pg (ref 26.0–34.0)
MCHC: 30.6 g/dL (ref 30.0–36.0)
MCV: 85 fL (ref 80.0–100.0)
Monocytes Absolute: 0.6 K/uL (ref 0.1–1.0)
Monocytes Relative: 7 %
Neutro Abs: 7.3 K/uL (ref 1.7–7.7)
Neutrophils Relative %: 82 %
Platelets: 276 K/uL (ref 150–400)
RBC: 4 MIL/uL (ref 3.87–5.11)
RDW: 16.5 % — ABNORMAL HIGH (ref 11.5–15.5)
WBC: 8.9 K/uL (ref 4.0–10.5)
nRBC: 0 % (ref 0.0–0.2)

## 2024-09-25 LAB — COMPREHENSIVE METABOLIC PANEL WITH GFR
ALT: 30 U/L (ref 0–44)
AST: 30 U/L (ref 15–41)
Albumin: 3.7 g/dL (ref 3.5–5.0)
Alkaline Phosphatase: 93 U/L (ref 38–126)
Anion gap: 11 (ref 5–15)
BUN: 13 mg/dL (ref 8–23)
CO2: 26 mmol/L (ref 22–32)
Calcium: 9.1 mg/dL (ref 8.9–10.3)
Chloride: 102 mmol/L (ref 98–111)
Creatinine, Ser: 0.99 mg/dL (ref 0.44–1.00)
GFR, Estimated: >60 mL/min
Glucose, Bld: 91 mg/dL (ref 70–99)
Potassium: 3.5 mmol/L (ref 3.5–5.1)
Sodium: 138 mmol/L (ref 135–145)
Total Bilirubin: 0.6 mg/dL (ref 0.0–1.2)
Total Protein: 6.5 g/dL (ref 6.5–8.1)

## 2024-09-25 LAB — RESP PANEL BY RT-PCR (RSV, FLU A&B, COVID)  RVPGX2
Influenza A by PCR: NEGATIVE
Influenza B by PCR: NEGATIVE
Resp Syncytial Virus by PCR: NEGATIVE
SARS Coronavirus 2 by RT PCR: NEGATIVE

## 2024-09-25 LAB — TROPONIN T, HIGH SENSITIVITY
Troponin T High Sensitivity: 33 ng/L — ABNORMAL HIGH (ref 0–19)
Troponin T High Sensitivity: 29 ng/L — ABNORMAL HIGH (ref 0–19)

## 2024-09-25 LAB — PRO BRAIN NATRIURETIC PEPTIDE: Pro Brain Natriuretic Peptide: 1406 pg/mL — ABNORMAL HIGH

## 2024-09-25 MED ORDER — ONDANSETRON HCL 4 MG/2ML IJ SOLN: 4.0000 mg | Freq: Four times a day (QID) | INTRAMUSCULAR | Status: DC | PRN

## 2024-09-25 MED ORDER — ACETAMINOPHEN 650 MG RE SUPP: 650.0000 mg | Freq: Four times a day (QID) | RECTAL | Status: DC | PRN

## 2024-09-25 MED ORDER — ONDANSETRON HCL 4 MG PO TABS: 4.0000 mg | ORAL_TABLET | Freq: Four times a day (QID) | ORAL | Status: DC | PRN

## 2024-09-25 MED ORDER — SACUBITRIL-VALSARTAN 97-103 MG PO TABS
1.0000 | ORAL_TABLET | Freq: Two times a day (BID) | ORAL | Status: DC
  Administered 2024-09-26 – 2024-09-27 (×4): 1 via ORAL
  Filled 2024-09-25 (×5): qty 1

## 2024-09-25 MED ORDER — ATORVASTATIN CALCIUM 40 MG PO TABS
40.0000 mg | ORAL_TABLET | Freq: Every day | ORAL | Status: DC
  Administered 2024-09-26: 40 mg via ORAL
  Filled 2024-09-25 (×2): qty 1

## 2024-09-25 MED ORDER — ACETAMINOPHEN 325 MG PO TABS
650.0000 mg | ORAL_TABLET | Freq: Four times a day (QID) | ORAL | Status: DC | PRN
  Administered 2024-09-26: 650 mg via ORAL
  Filled 2024-09-25: qty 2

## 2024-09-25 MED ORDER — APIXABAN 5 MG PO TABS
5.0000 mg | ORAL_TABLET | Freq: Two times a day (BID) | ORAL | Status: DC
  Administered 2024-09-26 – 2024-09-27 (×4): 5 mg via ORAL
  Filled 2024-09-25 (×4): qty 1

## 2024-09-25 MED ORDER — LORATADINE 10 MG PO TABS
10.0000 mg | ORAL_TABLET | Freq: Every day | ORAL | Status: DC
  Administered 2024-09-26 – 2024-09-27 (×2): 10 mg via ORAL
  Filled 2024-09-25 (×2): qty 1

## 2024-09-25 MED ORDER — ALPRAZOLAM 0.5 MG PO TABS
1.0000 mg | ORAL_TABLET | Freq: Three times a day (TID) | ORAL | Status: DC | PRN
  Administered 2024-09-26 (×2): 1 mg via ORAL
  Filled 2024-09-25 (×2): qty 2

## 2024-09-25 MED ORDER — AMIODARONE HCL 100 MG PO TABS
100.0000 mg | ORAL_TABLET | Freq: Every day | ORAL | Status: DC
  Administered 2024-09-26 – 2024-09-27 (×2): 100 mg via ORAL
  Filled 2024-09-25 (×2): qty 1

## 2024-09-25 MED ORDER — FLUOXETINE HCL 20 MG PO CAPS
40.0000 mg | ORAL_CAPSULE | Freq: Every day | ORAL | Status: DC
  Administered 2024-09-26 – 2024-09-27 (×2): 40 mg via ORAL
  Filled 2024-09-25 (×3): qty 2

## 2024-09-25 MED ORDER — LACTATED RINGERS IV SOLN: INTRAVENOUS | Status: DC

## 2024-09-25 MED ORDER — FUROSEMIDE 10 MG/ML IJ SOLN
40.0000 mg | Freq: Once | INTRAMUSCULAR | Status: AC
  Administered 2024-09-25: 40 mg via INTRAVENOUS
  Filled 2024-09-25: qty 4

## 2024-09-25 MED ORDER — GABAPENTIN 300 MG PO CAPS
300.0000 mg | ORAL_CAPSULE | Freq: Every day | ORAL | Status: DC
  Administered 2024-09-26 (×2): 300 mg via ORAL
  Filled 2024-09-25 (×2): qty 1

## 2024-09-25 MED ORDER — ALBUTEROL SULFATE (2.5 MG/3ML) 0.083% IN NEBU: 2.5000 mg | INHALATION_SOLUTION | RESPIRATORY_TRACT | Status: DC | PRN

## 2024-09-25 MED ORDER — FUROSEMIDE 10 MG/ML IJ SOLN
40.0000 mg | Freq: Every day | INTRAMUSCULAR | Status: DC
  Administered 2024-09-26: 40 mg via INTRAVENOUS
  Filled 2024-09-25: qty 4

## 2024-09-25 MED ORDER — PANTOPRAZOLE SODIUM 40 MG PO TBEC
40.0000 mg | DELAYED_RELEASE_TABLET | Freq: Every day | ORAL | Status: DC
  Administered 2024-09-26 – 2024-09-27 (×2): 40 mg via ORAL
  Filled 2024-09-25 (×2): qty 1

## 2024-09-25 MED ORDER — TRAZODONE HCL 50 MG PO TABS
50.0000 mg | ORAL_TABLET | Freq: Every evening | ORAL | Status: DC | PRN
  Filled 2024-09-25: qty 1

## 2024-09-25 MED ORDER — POTASSIUM CHLORIDE CRYS ER 20 MEQ PO TBCR
20.0000 meq | EXTENDED_RELEASE_TABLET | Freq: Every day | ORAL | Status: DC
  Administered 2024-09-26 – 2024-09-27 (×2): 20 meq via ORAL
  Filled 2024-09-25 (×2): qty 1

## 2024-09-25 MED ORDER — NITROGLYCERIN 2 % TD OINT
1.0000 [in_us] | TOPICAL_OINTMENT | Freq: Once | TRANSDERMAL | Status: AC
  Administered 2024-09-25: 1 [in_us] via TOPICAL
  Filled 2024-09-25: qty 1

## 2024-09-25 NOTE — ED Provider Notes (Signed)
 " [place] [facility]ERG[facility]MENT AT [facility] Provider Note   CSN: [id number] Arrival date & time: 09/25/24  2045     Patient presents with: Shortness of Breath   BIRDA DIDONATO is a 74 y.o. female.   74 year old female with history of paroxysmal A-fib, CHF presents with acute onset of shortness of breath this evening.  Patient recently diagnosed with influenza and had a hospitalization.  Was discharged in the hospital approximately 2 days ago after a 5-day stay.  Saw her primary care doctor today for follow-up and her visit was reviewed.  Was doing okay at that time.  Went home and became more dyspneic on exertion as well as dyspnea at rest.  No fever or chills.  No productive cough.  No increased pedal edema.  Notes some diffuse chest tightness which has been constant in nature.  Called EMS and transported here       Prior to Admission medications  Medication Sig Start Date End Date Taking? Authorizing Provider  albuterol  (VENTOLIN  HFA) 108 (90 Base) MCG/ACT inhaler Inhale 2 puffs into the lungs every 6 (six) hours as needed for wheezing or shortness of breath. 09/09/24   Jordan, Betty G, MD  ALPRAZolam  (XANAX ) 1 MG tablet TAKE 1 TABLET BY MOUTH THREE TIMES A DAY AS NEEDED FOR ANXIETY 09/22/24   Theophilus Andrews, Tully GRADE, MD  amiodarone  (PACERONE ) 100 MG tablet Take 1 tablet (100 mg total) by mouth daily. 08/18/24   Milford, Harlene HERO, FNP  apixaban  (ELIQUIS ) 5 MG TABS tablet Take 1 tablet (5 mg total) by mouth 2 (two) times daily. 06/23/24   Sabharwal, Aditya, DO  atorvastatin  (LIPITOR) 40 MG tablet Take 1 tablet (40 mg total) by mouth daily. 05/14/24   Sabharwal, Ria, DO  Calcium  Carb-Cholecalciferol (CALCIUM  600 + D PO) Take 600 mg by mouth daily.    [provider]  cetirizine  (ZYRTEC ) 10 MG tablet TAKE 1 TABLET BY MOUTH EVERY DAY 04/03/24   Theophilus Andrews, Tully GRADE, MD  chlorpheniramine-HYDROcodone  (TUSSIONEX) 10-8 MG/5ML Take 5 mLs by mouth at bedtime as  needed. 09/17/24   Theophilus Andrews, Tully GRADE, MD  clindamycin (CLEOCIN T) 1 % external solution Apply 1 Application topically 2 (two) times daily. 08/12/19   [provider]  clindamycin (CLEOCIN T) 1 % lotion Apply 1 applicator topically 2 (two) times daily as needed.    [provider]  dapagliflozin  propanediol (FARXIGA ) 10 MG TABS tablet Take 1 tablet (10 mg total) by mouth daily. 08/26/24   Colletta Manuelita Garre, PA-C  dextromethorphan -guaiFENesin  (MUCINEX  DM) 30-600 MG 12hr tablet Take 1 tablet by mouth 2 (two) times daily. 09/14/24 10/14/24  Regalado, Belkys A, MD  fluocinonide (LIDEX) 0.05 % external solution Apply 1 application topically daily as needed (rosacea).    [provider]  FLUoxetine  (PROZAC ) 40 MG capsule TAKE 1 CAPSULE BY MOUTH EVERY DAY 12/24/23   Theophilus Andrews, Tully GRADE, MD  fluticasone  (FLONASE ) 50 MCG/ACT nasal spray SPRAY 2 SPRAYS INTO EACH NOSTRIL EVERY DAY 05/20/24   Theophilus Andrews, Tully GRADE, MD  furosemide  (LASIX ) 40 MG tablet Take 1 tablet (40 mg total) by mouth as needed. For weight gain of 3 lbs in 24 hours or 5 lbs in a week 08/12/24   Glena Harlene HERO, FNP  gabapentin  (NEURONTIN ) 300 MG capsule TAKE 1 CAPSULE BY MOUTH EVERYDAY AT BEDTIME 05/12/24   Theophilus Andrews, Tully GRADE, MD  ipratropium-albuterol  (DUONEB) 0.5-2.5 (3) MG/3ML SOLN Take 3 mLs by nebulization 2 (two) times  daily. 09/14/24   Regalado, Owen A, MD  methocarbamol  (ROBAXIN ) 750 MG tablet Take 750 mg by mouth 3 (three) times daily as needed for muscle spasms.    [provider]  metroNIDAZOLE  (METROCREAM ) 0.75 % cream Apply 1 application topically 2 (two) times daily as needed (rosacea).     [provider]  Multiple Vitamin (MULTIVITAMIN WITH MINERALS) TABS tablet Take 1 tablet by mouth daily. Centrum Silver    [provider]  nitroGLYCERIN  (NITROSTAT ) 0.4 MG SL tablet Place 1 tablet (0.4 mg total) under the tongue every 5 (five) minutes as needed  for chest pain. 09/02/24 12/01/24  Theophilus Andrews, Tully GRADE, MD  omeprazole  (PRILOSEC) 40 MG capsule TAKE 1 CAPSULE BY MOUTH EVERY DAY 09/16/24   Theophilus Andrews, Tully GRADE, MD  potassium chloride  (KLOR-CON ) 10 MEQ tablet Take 2 tablets (20 mEq total) by mouth as needed. For weight gain of 3 lbs in 24 hours or 5 lbs in a week 08/12/24   Milford, Harlene HERO, FNP  Propylene Glycol (SYSTANE BALANCE) 0.6 % SOLN Place 1 drop into both eyes daily as needed (Dry eyes).    [provider]  sacubitril -valsartan  (ENTRESTO ) 97-103 MG Take 1 tablet by mouth 2 (two) times daily. 08/13/24   Lenetta Greig BIRCH, NP    Allergies: Achromycin [tetracycline hcl], Penicillins, Sulfamethoxazole-trimethoprim, Nickel, Oxycodone , and Zanaflex [tizanidine hydrochloride]    Review of Systems  All other systems reviewed and are negative.   Updated Vital Signs BP (!) 171/104 (BP Location: Right Arm)   Pulse (!) 108   Temp 98.1 F (36.7 C)   Resp (!) 23   Ht 1.676 m (5' 6)   Wt 126.1 kg   SpO2 95%   BMI 44.87 kg/m   Physical Exam Vitals and nursing note reviewed.  Constitutional:      General: She is not in acute distress.    Appearance: Normal appearance. She is well-developed. She is not toxic-appearing.  HENT:     Head: Normocephalic and atraumatic.  Eyes:     General: Lids are normal.     Conjunctiva/sclera: Conjunctivae normal.     Pupils: Pupils are equal, round, and reactive to light.  Neck:     Thyroid : No thyroid  mass.     Trachea: No tracheal deviation.  Cardiovascular:     Rate and Rhythm: Regular rhythm. Tachycardia present.     Heart sounds: Normal heart sounds. No murmur heard.    No gallop.  Pulmonary:     Effort: Tachypnea and prolonged expiration present. No respiratory distress.     Breath sounds: Decreased air movement present. Examination of the right-lower field reveals rhonchi. Examination of the left-lower field reveals rhonchi. Rhonchi present. No decreased breath sounds,  wheezing or rales.  Abdominal:     General: There is no distension.     Palpations: Abdomen is soft.     Tenderness: There is no abdominal tenderness. There is no rebound.  Musculoskeletal:        General: No tenderness. Normal range of motion.     Cervical back: Normal range of motion and neck supple.  Skin:    General: Skin is warm and dry.     Findings: No abrasion or rash.  Neurological:     Mental Status: She is alert and oriented to person, place, and time. Mental status is at baseline.     GCS: GCS eye subscore is 4. GCS verbal subscore is 5. GCS motor subscore is 6.  Cranial Nerves: No cranial nerve deficit.     Sensory: No sensory deficit.     Motor: Motor function is intact.  Psychiatric:        Attention and Perception: Attention normal.        Speech: Speech normal.        Behavior: Behavior normal.     (all labs ordered are listed, but only abnormal results are displayed) Labs Reviewed  RESP PANEL BY RT-PCR (RSV, FLU A&B, COVID)  RVPGX2  CBC WITH DIFFERENTIAL/PLATELET  PRO BRAIN NATRIURETIC PEPTIDE  COMPREHENSIVE METABOLIC PANEL WITH GFR  TROPONIN T, HIGH SENSITIVITY    EKG: EKG Interpretation Date/Time:  Thursday September 25 2024 20:52:22 EST Ventricular Rate:  120 PR Interval:    QRS Duration:  102 QT Interval:  362 QTC Calculation: 510 R Axis:   71  Text Interpretation: Sinus tachycardia Low voltage, precordial leads Borderline repolarization abnormality Prolonged QT interval Confirmed by Dasie Faden ([id number]) on 09/25/2024 9:05:20 PM  Radiology: No results found.   Procedures   Medications Ordered in the ED  lactated ringers  infusion (has no administration in time range)  furosemide  (LASIX ) injection 40 mg (has no administration in time range)  nitroGLYCERIN  (NITROGLYN) 2 % ointment 1 inch (has no administration in time range)                                    Medical Decision Making Amount and/or Complexity of Data Reviewed Labs:  ordered. Radiology: ordered.  Risk Prescription drug management.   Patient's chest x-ray consistent with CHF.  We given Lasix  as well as Nitropaste.  Patient is on oxygen at 4 L right now.  Will continue this.  Will check cardiac enzymes.  Suspect that she has decompensated congestive heart failure.  Patient to require admission.  Will consult hospitalist team once labs are resulted   CRITICAL CARE Performed by: Faden ONEIDA Dasie Total critical care time: 45 minutes Critical care time was exclusive of separately billable procedures and treating other patients. Critical care was necessary to treat or prevent imminent or life-threatening deterioration. Critical care was time spent personally by me on the following activities: development of treatment plan with patient and/or surrogate as well as nursing, discussions with consultants, evaluation of patient's response to treatment, examination of patient, obtaining history from patient or surrogate, ordering and performing treatments and interventions, ordering and review of laboratory studies, ordering and review of radiographic studies, pulse oximetry and re-evaluation of patient's condition.      Final diagnoses:  None    ED Discharge Orders     None          Dasie Faden, MD 09/25/24 2132  "

## 2024-09-25 NOTE — Progress Notes (Signed)
 "    Hospital follow-up visit     CC/Reason for Visit: Hospitalization follow-up  HPI: Kathy [name] is a 74 y.o. female who is coming in today for the above mentioned reasons, specifically transitional care services face-to-face visit.    Dates hospitalized: 09/06/2024-09/14/2024 Days since discharge from hospital: 10 Patient was discharged from the hospital to: Home Reason for admission to hospital: Acute hypoxemic respiratory failure due to influenza A Date of interactive phone contact with patient and/or caregiver: 09/15/2024  I have reviewed in detail patient's discharge summary plus pertinent specific notes, labs, and images from the hospitalization.  Yes  Patient was urged to come to the hospital by her family members after noting home oxygen levels to be in the mid to high 70s.  She was diagnosed with influenza A.  She was treated with prednisone  and Tamiflu .  She was also given albuterol  nebulizer.  After 4 days she was discharged home.  She was found to have transaminitis that was suspected due to steroid usage.  Medication reconciliation was done today and patient is taking meds as recommended by discharging hospitalist/specialist.  Yes   Past Medical/Surgical History: Past Medical History:  Diagnosis Date   Adenomatous colon polyp    2012   Anxiety and depression    Arthritis    Osteoarthritis, Dr Hiram   Cancer [facility])    skin - left foot excised   Chronic diastolic CHF (congestive heart failure) (HCC)    DDD (degenerative disc disease), cervical    Deaf, right    DVT (deep venous thrombosis) (HCC)    RLE DVT 08/2004, post right knee arthroscopy   Dysrhythmia    A.Fib   Fibromyalgia    Dr Ludie Piety, Rheu   GERD (gastroesophageal reflux disease)    H/O hiatal hernia    Headache(784.0)    PAST HX MILD MIGRAINES - resolved per patient 08/18/20   History of kidney stones    passed stones   HTN (hypertension)    IBS (irritable bowel syndrome)     Internal hemorrhoid    Lumbar radiculopathy    Obesity    OSA on CPAP    Moderate with AHI 20/hr with nocturnal hypoxemia on CPAP   PAF (paroxysmal atrial fibrillation) (HCC) 06/2018   Pulmonary hypertension (HCC)     Past Surgical History:  Procedure Laterality Date   ABDOMINAL HYSTERECTOMY     dysfunctional menses   APPENDECTOMY     BREAST EXCISIONAL BIOPSY Right    CARDIAC CATHETERIZATION  06/26/2018   CARDIOVERSION N/A 10/10/2022   Procedure: CARDIOVERSION;  Surgeon: Gardenia Led, DO;  Location: [facility] ENDOS[facility];  Service: Cardiovascular;  Laterality: N/A;   CHOLECYSTECTOMY     COLONOSCOPY  05/27/2002, 08/08/11   2003 diverticulosis, hemorrhoids 2012 same + cecal polyp   EYE SURGERY     KNEE ARTHROSCOPY  2008   right   KYPHOPLASTY Bilateral 08/19/2020   Procedure: KYPHOPLASTY LUMBAR ONE;  Surgeon: Debby Dorn MATSU, MD;  Location: [facility] OR;  Service: Neurosurgery;  Laterality: Bilateral;  posterior   KYPHOPLASTY N/A 07/31/2023   Procedure: KYPHOPLASTY LUMBAR FOUR;  Surgeon: Debby Dorn MATSU, MD;  Location: [facility] OR;  Service: Neurosurgery;  Laterality: N/A;   LEFT HEART CATH AND CORONARY ANGIOGRAPHY N/A 06/26/2018   Procedure: LEFT HEART CATH AND CORONARY ANGIOGRAPHY;  Surgeon: Darron Deatrice LABOR, MD;  Location: [facility] INVASIVE CV LAB;  Service: Cardiovascular;  Laterality: N/A;   MELANOMA EXCISION Left 07/2013   foot   RIGHT HEART CATH  N/A 10/10/2022   Procedure: RIGHT HEART CATH;  Surgeon: Gardenia Led, DO;  Location: [facility] INVASIVE CV LAB;  Service: Cardiovascular;  Laterality: N/A;   TEE WITHOUT CARDIOVERSION N/A 10/10/2022   Procedure: TRANSESOPHAGEAL ECHOCARDIOGRAM (TEE);  Surgeon: Gardenia Led, DO;  Location: [facility]OS[facility];  Service: Cardiovascular;  Laterality: N/A;   TOTAL HIP ARTHROPLASTY  02/2010   left ; Dr Hiram   TOTAL HIP ARTHROPLASTY  03/18/2012   Procedure: TOTAL HIP ARTHROPLASTY;  Surgeon: Dempsey LULLA Moan, MD;  Location: [facility] ORS;  Service: Orthopedics;   Laterality: Right;   UPPER GASTROINTESTINAL ENDOSCOPY  04/21/2009, 08/08/11   2010 gastritis ;2012 small hiatal hernia. Dr Avram   WRIST FRACTURE SURGERY Left     Social History:  reports that she has never smoked. She has never used smokeless tobacco. She reports current alcohol  use. She reports that she does not use drugs.  Allergies: Allergies[1]  Family History:  Family History  Problem Relation Age of Onset   Heart attack Mother 37   COPD Mother    Breast cancer Mother 79   Hypertension Father    Diabetes Father    Heart failure Father 75   Breast cancer Paternal Aunt        cns mets   COPD Maternal Grandmother    Heart disease Paternal Grandmother        died 40   Diabetes Paternal Grandmother    Stroke Paternal Grandfather        in late 42s   Heart attack Brother 3   Colon cancer Neg Hx     Current Medications[2]  Review of Systems:  Negative except as mentioned in HPI.    Physical Exam: Vitals:   09/25/24 1439  BP: 124/80  Pulse: 70  Temp: 97.7 F (36.5 C)  TempSrc: Oral  SpO2: 97%  Weight: 282 lb 8 oz (128.1 kg)    Body mass index is 45.6 kg/m.   Physical Exam Vitals reviewed.  Constitutional:      Appearance: Normal appearance. She is obese.  HENT:     Head: Normocephalic and atraumatic.  Eyes:     Conjunctiva/sclera: Conjunctivae normal.  Cardiovascular:     Rate and Rhythm: Normal rate and regular rhythm.  Pulmonary:     Effort: Pulmonary effort is normal.     Breath sounds: Normal breath sounds.  Skin:    General: Skin is warm and dry.  Neurological:     General: No focal deficit present.     Mental Status: She is alert and oriented to person, place, and time.  Psychiatric:        Mood and Affect: Mood normal.        Behavior: Behavior normal.        Thought Content: Thought content normal.        Judgment: Judgment normal.     Impression and Plan:  Hospital discharge follow-up  Influenza A  Acute respiratory  failure with hypoxia [facility])  [facility] charts reviewed in detail. - Ambulating O2 sats never dropped below 92% in office. - Check CMP to follow LFTs as requested by discharging physician.  Medical decision making of medium complexity was utilized today.      Tully Theophilus Andrews, MD [place]     [1]  Allergies Allergen Reactions   Achromycin [Tetracycline Hcl] Shortness Of Breath    Dyspnea     Penicillins     Facial angioedema Did it involve swelling of the face/tongue/throat, SOB, or low BP?  Yes Did it involve sudden or severe rash/hives, skin peeling, or any reaction on the inside of your mouth or nose? No Did you need to seek medical attention at a hospital or doctor's office? No When did it last happen?      30+ years If all above answers are NO, may proceed with cephalosporin use.    Sulfamethoxazole-Trimethoprim Hives   Nickel Itching   Oxycodone  Itching   Zanaflex [Tizanidine Hydrochloride] Other (See Comments)    Causes more pain   [2]  Current Outpatient Medications:    albuterol  (VENTOLIN  HFA) 108 (90 Base) MCG/ACT inhaler, Inhale 2 puffs into the lungs every 6 (six) hours as needed for wheezing or shortness of breath., Disp: 8 g, Rfl: 0   ALPRAZolam  (XANAX ) 1 MG tablet, TAKE 1 TABLET BY MOUTH THREE TIMES A DAY AS NEEDED FOR ANXIETY, Disp: 90 tablet, Rfl: 0   amiodarone  (PACERONE ) 100 MG tablet, Take 1 tablet (100 mg total) by mouth daily., Disp: 90 tablet, Rfl: 3   apixaban  (ELIQUIS ) 5 MG TABS tablet, Take 1 tablet (5 mg total) by mouth 2 (two) times daily., Disp: 60 tablet, Rfl: 5   atorvastatin  (LIPITOR) 40 MG tablet, Take 1 tablet (40 mg total) by mouth daily., Disp: 90 tablet, Rfl: 3   Calcium  Carb-Cholecalciferol (CALCIUM  600 + D PO), Take 600 mg by mouth daily., Disp: , Rfl:    cetirizine  (ZYRTEC ) 10 MG tablet, TAKE 1 TABLET BY MOUTH EVERY DAY, Disp: 90 tablet, Rfl: 1   chlorpheniramine-HYDROcodone  (TUSSIONEX) 10-8 MG/5ML, Take 5 mLs by  mouth at bedtime as needed., Disp: 70 mL, Rfl: 0   clindamycin (CLEOCIN T) 1 % external solution, Apply 1 Application topically 2 (two) times daily., Disp: , Rfl:    clindamycin (CLEOCIN T) 1 % lotion, Apply 1 applicator topically 2 (two) times daily as needed., Disp: , Rfl:    dapagliflozin  propanediol (FARXIGA ) 10 MG TABS tablet, Take 1 tablet (10 mg total) by mouth daily., Disp: 30 tablet, Rfl: 11   dextromethorphan -guaiFENesin  (MUCINEX  DM) 30-600 MG 12hr tablet, Take 1 tablet by mouth 2 (two) times daily., Disp: 60 tablet, Rfl: 0   fluocinonide (LIDEX) 0.05 % external solution, Apply 1 application topically daily as needed (rosacea)., Disp: , Rfl:    FLUoxetine  (PROZAC ) 40 MG capsule, TAKE 1 CAPSULE BY MOUTH EVERY DAY, Disp: 90 capsule, Rfl: 1   fluticasone  (FLONASE ) 50 MCG/ACT nasal spray, SPRAY 2 SPRAYS INTO EACH NOSTRIL EVERY DAY, Disp: 48 mL, Rfl: 2   furosemide  (LASIX ) 40 MG tablet, Take 1 tablet (40 mg total) by mouth as needed. For weight gain of 3 lbs in 24 hours or 5 lbs in a week, Disp: 90 tablet, Rfl: 1   gabapentin  (NEURONTIN ) 300 MG capsule, TAKE 1 CAPSULE BY MOUTH EVERYDAY AT BEDTIME, Disp: 90 capsule, Rfl: 1   ipratropium-albuterol  (DUONEB) 0.5-2.5 (3) MG/3ML SOLN, Take 3 mLs by nebulization 2 (two) times daily., Disp: 360 mL, Rfl: 0   methocarbamol  (ROBAXIN ) 750 MG tablet, Take 750 mg by mouth 3 (three) times daily as needed for muscle spasms., Disp: , Rfl:    metroNIDAZOLE  (METROCREAM ) 0.75 % cream, Apply 1 application topically 2 (two) times daily as needed (rosacea). , Disp: , Rfl:    Multiple Vitamin (MULTIVITAMIN WITH MINERALS) TABS tablet, Take 1 tablet by mouth daily. Centrum Silver, Disp: , Rfl:    nitroGLYCERIN  (NITROSTAT ) 0.4 MG SL tablet, Place 1 tablet (0.4 mg total) under the tongue every 5 (five) minutes as needed for chest  pain., Disp: 90 tablet, Rfl: 3   omeprazole  (PRILOSEC) 40 MG capsule, TAKE 1 CAPSULE BY MOUTH EVERY DAY, Disp: 90 capsule, Rfl: 1   potassium  chloride (KLOR-CON ) 10 MEQ tablet, Take 2 tablets (20 mEq total) by mouth as needed. For weight gain of 3 lbs in 24 hours or 5 lbs in a week, Disp: 180 tablet, Rfl: 3   Propylene Glycol (SYSTANE BALANCE) 0.6 % SOLN, Place 1 drop into both eyes daily as needed (Dry eyes)., Disp: , Rfl:    sacubitril -valsartan  (ENTRESTO ) 97-103 MG, Take 1 tablet by mouth 2 (two) times daily., Disp: 60 tablet, Rfl: 1  "

## 2024-09-25 NOTE — ED Triage Notes (Signed)
 Pt brought by EMS from Home with compalints of SOB  started 2-3 hours ago. As per PT she has a flu and just finished her meds 2 weeks ago. Pt currently on Breathing treatment at home. No history of COPD and Asthma. 95% on 2lNC. Alert X4.

## 2024-09-25 NOTE — H&P (Signed)
 " History and Physical  Kathy [name] FMW:[id number] DOB: [birth year] DOA: 09/25/2024  PCP: Theophilus Andrews, Tully GRADE, MD   Chief Complaint: Shortness of breath  HPI: Kathy [name] is a 74 y.o. female with medical history significant for paroxysmal atrial fibrillation on Eliquis , heart failure with preserved EF, recent hospitalization for acute hypoxic respiratory failure in the setting of influenza A viral infection who is now being admitted to the hospital with acute hypoxic respiratory failure due to acute on chronic heart failure with preserved EF.  Patient was just discharged from [facility] on 12/28 after stay for reported hypoxic respiratory failure in the setting of influenza A.  She was discharged home in stable condition on room air, states that she was still little bit short of breath with exertion, but this evening while she was at home just sitting on the chair watching TV, she suddenly started getting progressively short of breath.  She had no significant wheezing or cough.  Denies any chest pain, sick contacts, fevers, chills, abdominal pain, orthopnea, lower extremity edema or any other concerns.  She started to get so short of breath that she called EMS.  Unclear what her O2 saturation was on room air, she was placed on 2 L nasal cannula by EMS, saturating 95%.  Workup in the ER as detailed below shows evidence of some pulmonary edema, workup is negative for acute infectious process.  Review of Systems: Please see HPI for pertinent positives and negatives. A complete 10 system review of systems are otherwise negative.  Past Medical History:  Diagnosis Date   Adenomatous colon polyp    2012   Anxiety and depression    Arthritis    Osteoarthritis, Dr Hiram   Cancer [facility])    skin - left foot excised   Chronic diastolic CHF (congestive heart failure) (HCC)    DDD (degenerative disc disease), cervical    Deaf, right    DVT (deep venous thrombosis) (HCC)    RLE DVT 08/2004, post  right knee arthroscopy   Dysrhythmia    A.Fib   Fibromyalgia    Dr Ludie Piety, Rheu   GERD (gastroesophageal reflux disease)    H/O hiatal hernia    Headache(784.0)    PAST HX MILD MIGRAINES - resolved per patient 08/18/20   History of kidney stones    passed stones   HTN (hypertension)    IBS (irritable bowel syndrome)    Internal hemorrhoid    Lumbar radiculopathy    Obesity    OSA on CPAP    Moderate with AHI 20/hr with nocturnal hypoxemia on CPAP   PAF (paroxysmal atrial fibrillation) (HCC) 06/2018   Pulmonary hypertension (HCC)    Past Surgical History:  Procedure Laterality Date   ABDOMINAL HYSTERECTOMY     dysfunctional menses   APPENDECTOMY     BREAST EXCISIONAL BIOPSY Right    CARDIAC CATHETERIZATION  06/26/2018   CARDIOVERSION N/A 10/10/2022   Procedure: CARDIOVERSION;  Surgeon: Gardenia Led, DO;  Location: [facility] ENDOSCOPY;  Service: Cardiovascular;  Laterality: N/A;   CHOLECYSTECTOMY     COLONOSCOPY  05/27/2002, 08/08/11   2003 diverticulosis, hemorrhoids 2012 same + cecal polyp   EYE SURGERY     KNEE ARTHROSCOPY  2008   right   KYPHOPLASTY Bilateral 08/19/2020   Procedure: KYPHOPLASTY LUMBAR ONE;  Surgeon: Debby Dorn MATSU, MD;  Location: [facility] OR;  Service: Neurosurgery;  Laterality: Bilateral;  posterior   KYPHOPLASTY N/A 07/31/2023   Procedure: KYPHOPLASTY LUMBAR FOUR;  Surgeon: Debby,  Dorn MATSU, MD;  Location: [facility] OR;  Service: Neurosurgery;  Laterality: N/A;   LEFT HEART CATH AND CORONARY ANGIOGRAPHY N/A 06/26/2018   Procedure: LEFT HEART CATH AND CORONARY ANGIOGRAPHY;  Surgeon: Darron Deatrice LABOR, MD;  Location: [facility] INVASIVE CV LAB;  Service: Cardiovascular;  Laterality: N/A;   MELANOMA EXCISION Left 07/2013   foot   RIGHT HEART CATH N/A 10/10/2022   Procedure: RIGHT HEART CATH;  Surgeon: Gardenia Led, DO;  Location: [facility] INVASIVE CV LAB;  Service: Cardiovascular;  Laterality: N/A;   TEE WITHOUT CARDIOVERSION N/A 10/10/2022   Procedure:  TRANSESOPHAGEAL ECHOCARDIOGRAM (TEE);  Surgeon: Gardenia Led, DO;  Location: [facility] ENDOSCOPY;  Service: Cardiovascular;  Laterality: N/A;   TOTAL HIP ARTHROPLASTY  02/2010   left ; Dr Hiram   TOTAL HIP ARTHROPLASTY  03/18/2012   Procedure: TOTAL HIP ARTHROPLASTY;  Surgeon: Dempsey LULLA Moan, MD;  Location: [facility] ORS;  Service: Orthopedics;  Laterality: Right;   UPPER GASTROINTESTINAL ENDOSCOPY  04/21/2009, 08/08/11   2010 gastritis ;2012 small hiatal hernia. Dr Avram   WRIST FRACTURE SURGERY Left    Social History:  reports that she has never smoked. She has never used smokeless tobacco. She reports current alcohol  use. She reports that she does not use drugs.  Allergies[1]  Family History  Problem Relation Age of Onset   Heart attack Mother 59   COPD Mother    Breast cancer Mother 33   Hypertension Father    Diabetes Father    Heart failure Father 70   Breast cancer Paternal Aunt        cns mets   COPD Maternal Grandmother    Heart disease Paternal Grandmother        died 10   Diabetes Paternal Grandmother    Stroke Paternal Grandfather        in late 79s   Heart attack Brother 33   Colon cancer Neg Hx      Prior to Admission medications  Medication Sig Start Date End Date Taking? Authorizing Provider  albuterol  (VENTOLIN  HFA) 108 (90 Base) MCG/ACT inhaler Inhale 2 puffs into the lungs every 6 (six) hours as needed for wheezing or shortness of breath. 09/09/24   Jordan, Betty G, MD  ALPRAZolam  (XANAX ) 1 MG tablet TAKE 1 TABLET BY MOUTH THREE TIMES A DAY AS NEEDED FOR ANXIETY 09/22/24   Theophilus Andrews, Tully GRADE, MD  amiodarone  (PACERONE ) 100 MG tablet Take 1 tablet (100 mg total) by mouth daily. 08/18/24   Milford, Harlene HERO, FNP  apixaban  (ELIQUIS ) 5 MG TABS tablet Take 1 tablet (5 mg total) by mouth 2 (two) times daily. 06/23/24   Sabharwal, Aditya, DO  atorvastatin  (LIPITOR) 40 MG tablet Take 1 tablet (40 mg total) by mouth daily. 05/14/24   Sabharwal, Aditya, DO  Calcium   Carb-Cholecalciferol (CALCIUM  600 + D PO) Take 600 mg by mouth daily.    [provider]  cetirizine  (ZYRTEC ) 10 MG tablet TAKE 1 TABLET BY MOUTH EVERY DAY 04/03/24   Theophilus Andrews, Tully GRADE, MD  chlorpheniramine-HYDROcodone  (TUSSIONEX) 10-8 MG/5ML Take 5 mLs by mouth at bedtime as needed. 09/17/24   Theophilus Andrews, Tully GRADE, MD  clindamycin (CLEOCIN T) 1 % external solution Apply 1 Application topically 2 (two) times daily. 08/12/19   [provider]  clindamycin (CLEOCIN T) 1 % lotion Apply 1 applicator topically 2 (two) times daily as needed.    [provider]  dapagliflozin  propanediol (FARXIGA ) 10 MG TABS tablet Take 1 tablet (10 mg total) by  mouth daily. 08/26/24   Colletta Manuelita Garre, PA-C  dextromethorphan -guaiFENesin  (MUCINEX  DM) 30-600 MG 12hr tablet Take 1 tablet by mouth 2 (two) times daily. 09/14/24 10/14/24  Regalado, Belkys A, MD  fluocinonide (LIDEX) 0.05 % external solution Apply 1 application topically daily as needed (rosacea).    [provider]  FLUoxetine  (PROZAC ) 40 MG capsule TAKE 1 CAPSULE BY MOUTH EVERY DAY 12/24/23   Theophilus Andrews, Tully GRADE, MD  fluticasone  (FLONASE ) 50 MCG/ACT nasal spray SPRAY 2 SPRAYS INTO EACH NOSTRIL EVERY DAY 05/20/24   Theophilus Andrews, Tully GRADE, MD  furosemide  (LASIX ) 40 MG tablet Take 1 tablet (40 mg total) by mouth as needed. For weight gain of 3 lbs in 24 hours or 5 lbs in a week 08/12/24   Glena Harlene HERO, FNP  gabapentin  (NEURONTIN ) 300 MG capsule TAKE 1 CAPSULE BY MOUTH EVERYDAY AT BEDTIME 05/12/24   Theophilus Andrews, Tully GRADE, MD  ipratropium-albuterol  (DUONEB) 0.5-2.5 (3) MG/3ML SOLN Take 3 mLs by nebulization 2 (two) times daily. 09/14/24   Regalado, Belkys A, MD  methocarbamol  (ROBAXIN ) 750 MG tablet Take 750 mg by mouth 3 (three) times daily as needed for muscle spasms.    [provider]  metroNIDAZOLE  (METROCREAM ) 0.75 % cream Apply 1 application topically 2 (two) times daily as needed  (rosacea).     [provider]  Multiple Vitamin (MULTIVITAMIN WITH MINERALS) TABS tablet Take 1 tablet by mouth daily. Centrum Silver    [provider]  nitroGLYCERIN  (NITROSTAT ) 0.4 MG SL tablet Place 1 tablet (0.4 mg total) under the tongue every 5 (five) minutes as needed for chest pain. 09/02/24 12/01/24  Theophilus Andrews, Tully GRADE, MD  omeprazole  (PRILOSEC) 40 MG capsule TAKE 1 CAPSULE BY MOUTH EVERY DAY 09/16/24   Theophilus Andrews, Tully GRADE, MD  potassium chloride  (KLOR-CON ) 10 MEQ tablet Take 2 tablets (20 mEq total) by mouth as needed. For weight gain of 3 lbs in 24 hours or 5 lbs in a week 08/12/24   Milford, Harlene HERO, FNP  Propylene Glycol (SYSTANE BALANCE) 0.6 % SOLN Place 1 drop into both eyes daily as needed (Dry eyes).    [provider]  sacubitril -valsartan  (ENTRESTO ) 97-103 MG Take 1 tablet by mouth 2 (two) times daily. 08/13/24   Lenetta No D, NP    Physical Exam: BP (!) 154/85   Pulse 64   Temp 98.1 F (36.7 C)   Resp 20   Ht 5' 6 (1.676 m)   Wt 126.1 kg   SpO2 96%   BMI 44.87 kg/m  General:  Alert, oriented, calm, in no acute distress, wearing 2 L nasal cannula oxygen.  Speaking full sentences, no cough.  She does look moderately volume overloaded on exam. Cardiovascular: RRR, no murmurs or rubs, no peripheral edema  Respiratory: clear to auscultation bilaterally, no wheezes,, bilateral basilar crackles left greater than right Abdomen: soft, nontender, nondistended, normal bowel tones heard  Skin: dry, no rashes  Musculoskeletal: no joint effusions, normal range of motion  Psychiatric: appropriate affect, normal speech  Neurologic: extraocular muscles intact, clear speech, moving all extremities with intact sensorium         Labs on Admission:  Basic Metabolic Panel: Recent Labs  Lab 09/25/24 2143  NA 138  K 3.5  CL 102  CO2 26  GLUCOSE 91  BUN 13  CREATININE 0.99  CALCIUM  9.1   Liver Function Tests: Recent Labs  Lab  09/25/24 2143  AST 30  ALT 30  ALKPHOS 93  BILITOT 0.6  PROT 6.5  ALBUMIN  3.7   No results for input(s): LIPASE, AMYLASE in the last 168 hours. No results for input(s): AMMONIA in the last 168 hours. CBC: Recent Labs  Lab 09/25/24 2143  WBC 8.9  NEUTROABS 7.3  HGB 10.4*  HCT 34.0*  MCV 85.0  PLT 276   Cardiac Enzymes: No results for input(s): CKTOTAL, CKMB, CKMBINDEX, TROPONINI in the last 168 hours. BNP (last 3 results) Recent Labs    12/05/23 1220 08/12/24 1550  BNP 247.0* 78.7    ProBNP (last 3 results) Recent Labs    09/10/24 1443 09/25/24 2143  PROBNP 1,421.0* 1,406.0*    CBG: No results for input(s): GLUCAP in the last 168 hours.  Radiological Exams on Admission: DG Chest Port 1 View Result Date: 09/25/2024 EXAM: 1 VIEW(S) XRAY OF THE CHEST 09/25/2024 09:24:00 PM COMPARISON: 09/10/2024. CLINICAL HISTORY: sob sob FINDINGS: LUNGS AND PLEURA: Vascular congestion with perihilar and interstitial opacities, possibly interstitial edema. No pleural effusion. No pneumothorax. HEART AND MEDIASTINUM: No acute abnormality of the cardiac and mediastinal silhouettes. BONES AND SOFT TISSUES: No acute osseous abnormality. IMPRESSION: 1. Pulmonary vascular congestion with perihilar and interstitial opacities, consistent with interstitial pulmonary edema. Electronically signed by: Franky Crease MD MD 09/25/2024 09:28 PM EST RP Workstation: [id number]   Assessment/Plan Kathy [name] is a 74 y.o. female with medical history significant for paroxysmal atrial fibrillation on Eliquis , heart failure with preserved EF, recent hospitalization for acute hypoxic respiratory failure in the setting of influenza A viral infection who is now being admitted to the hospital with acute hypoxic respiratory failure due to acute on chronic heart failure with preserved EF.    Acute hypoxic respiratory failure, acute on chronic heart failure with preserved EF-with increased cough,  crackles on exam, elevated BNP (though not significantly elevated when compared to prior), in the setting of her Lasix  recently being held.  Chest x-ray with pulmonary edema, no consolidation.  Viral respiratory panel negative. -Observation admission -Monitor on telemetry -Heart healthy diet with fluid restriction -IV Lasix  daily, with potassium supplementation -Continue home Entresto   Transaminitis-was seen during previous hospitalization, likely due to prednisone  and has now resolved.  Paroxysmal atrial fibrillation-monitor on telemetry, continue home dose amiodarone  100 mg p.o. daily, Eliquis  5 mg p.o. twice daily  Hyperlipidemia-atorvastatin   Anxiety/depression-fluoxetine , alprazolam  3 times daily as needed  GERD-Protonix   DVT prophylaxis: Eliquis     Code Status: Full Code  Consults called: None  Admission status: Observation  Time spent: 52 minutes  Phu Record Kathy Gail MD [facility] Pager [phone]  If 7PM-7AM, please contact night-coverage [email or web address] Password [email or web address]  09/25/2024, 11:42 PM      [1]  Allergies Allergen Reactions   Achromycin [Tetracycline Hcl] Shortness Of Breath    Dyspnea     Penicillins     Facial angioedema Did it involve swelling of the face/tongue/throat, SOB, or low BP? Yes Did it involve sudden or severe rash/hives, skin peeling, or any reaction on the inside of your mouth or nose? No Did you need to seek medical attention at a hospital or doctor's office? No When did it last happen?      30+ years If all above answers are NO, may proceed with cephalosporin use.    Sulfamethoxazole-Trimethoprim Hives   Nickel Itching   Oxycodone  Itching   Zanaflex [Tizanidine Hydrochloride] Other (See Comments)    Causes more pain    "

## 2024-09-26 ENCOUNTER — Encounter (HOSPITAL_COMMUNITY): Payer: Self-pay | Admitting: Internal Medicine

## 2024-09-26 ENCOUNTER — Telehealth: Payer: Self-pay

## 2024-09-26 ENCOUNTER — Ambulatory Visit: Payer: Self-pay | Admitting: Family Medicine

## 2024-09-26 DIAGNOSIS — I5033 Acute on chronic diastolic (congestive) heart failure: Principal | ICD-10-CM

## 2024-09-26 LAB — CBC
HCT: 31.3 % — ABNORMAL LOW (ref 36.0–46.0)
Hemoglobin: 9.6 g/dL — ABNORMAL LOW (ref 12.0–15.0)
MCH: 26.4 pg (ref 26.0–34.0)
MCHC: 30.7 g/dL (ref 30.0–36.0)
MCV: 86.2 fL (ref 80.0–100.0)
Platelets: 244 K/uL (ref 150–400)
RBC: 3.63 MIL/uL — ABNORMAL LOW (ref 3.87–5.11)
RDW: 16.7 % — ABNORMAL HIGH (ref 11.5–15.5)
WBC: 5.1 K/uL (ref 4.0–10.5)
nRBC: 0 % (ref 0.0–0.2)

## 2024-09-26 LAB — BASIC METABOLIC PANEL WITH GFR
Anion gap: 9 (ref 5–15)
BUN: 13 mg/dL (ref 8–23)
CO2: 30 mmol/L (ref 22–32)
Calcium: 8.8 mg/dL — ABNORMAL LOW (ref 8.9–10.3)
Chloride: 102 mmol/L (ref 98–111)
Creatinine, Ser: 1.08 mg/dL — ABNORMAL HIGH (ref 0.44–1.00)
GFR, Estimated: 54 mL/min — ABNORMAL LOW
Glucose, Bld: 97 mg/dL (ref 70–99)
Potassium: 3.5 mmol/L (ref 3.5–5.1)
Sodium: 141 mmol/L (ref 135–145)

## 2024-09-26 LAB — D-DIMER, QUANTITATIVE: D-Dimer, Quant: <0.27 ug{FEU}/mL (ref 0.00–0.50)

## 2024-09-26 MED ORDER — TRAMADOL HCL 50 MG PO TABS: 50.0000 mg | ORAL_TABLET | Freq: Four times a day (QID) | ORAL | Status: DC | PRN

## 2024-09-26 MED ORDER — DAPAGLIFLOZIN PROPANEDIOL 10 MG PO TABS
10.0000 mg | ORAL_TABLET | Freq: Every day | ORAL | Status: DC
  Administered 2024-09-27: 10 mg via ORAL
  Filled 2024-09-26 (×2): qty 1

## 2024-09-26 MED ORDER — FUROSEMIDE 40 MG PO TABS
40.0000 mg | ORAL_TABLET | Freq: Every day | ORAL | Status: DC
  Administered 2024-09-26 – 2024-09-27 (×2): 40 mg via ORAL
  Filled 2024-09-26 (×2): qty 1

## 2024-09-26 MED ORDER — DM-GUAIFENESIN ER 30-600 MG PO TB12
1.0000 | ORAL_TABLET | Freq: Two times a day (BID) | ORAL | Status: DC
  Administered 2024-09-26 – 2024-09-27 (×3): 1 via ORAL
  Filled 2024-09-26 (×3): qty 1

## 2024-09-26 MED ORDER — IPRATROPIUM-ALBUTEROL 0.5-2.5 (3) MG/3ML IN SOLN
3.0000 mL | Freq: Two times a day (BID) | RESPIRATORY_TRACT | Status: DC
  Administered 2024-09-26 – 2024-09-27 (×3): 3 mL via RESPIRATORY_TRACT
  Filled 2024-09-26 (×3): qty 3

## 2024-09-26 NOTE — Telephone Encounter (Signed)
 Copied from CRM #[id number]. Topic: Appointments - Scheduling Inquiry for Clinic >> Sep 26, 2024 12:06 PM Laymon HERO wrote: Reason for CRM: Patient son Medford asking for a nurse or Dr Theophilus to reach out to him- has some concerns about his mothers recent hospital visit and other health concerns

## 2024-09-26 NOTE — Progress Notes (Signed)
" °  Progress Note   Patient: Kathy [name] MRN:[id number] DOB: 1950/11/09 DOA: 09/25/2024     0 DOS: the patient was seen and examined on 09/26/2024 at 10:47 AM      Brief hospital course: 74 y.o. F with obesity, pAF on Eliquis , dCHF, and recently admitted for influenza presented with SOB.  Seen in office day of admission, ambulated on room air with normal SpO2.  Went home and felt SOB so returned to the ER.  In the ER, CXR interpreted as edema.  BNP similar to previous.   Started on Lasix  and admitted.     Assessment and Plan: * Acute on chronic diastolic (congestive) heart failure (HCC) Elevated troponin Presented with shortness of breath on exertion, chest x-ray showing bilateral infiltrates, elevated proBNP.  Diuresed overnight and weaned off of oxygen.  Given shortness of breath out of proportion to exam, will obtain D-dimer rule out PE. - Continue home Entresto  - Transition back to home Lasix  - Resume home Farxiga  -Follow dimer  CKD (chronic kidney disease) stage 3, GFR 30-59 ml/min (HCC) CKD stage IIIa, creatinine stable relative to baseline  Chronic pain disorder - Continue home tramadol , gabapentin , Prozac , Xanax   OSA on CPAP - CPAP at night  Paroxysmal atrial fibrillation (HCC) In sinus rhythm. - Continue amiodarone , Eliquis   Morbid obesity (HCC) BMI 44.8, complicates care  Anxiety and depression - Continue Xanax , Prozac   Anemia, unspecified Hemoglobin stable relative to recent baseline, no bleeding reported or observed  Personal history of DVT (deep vein thrombosis) - Continue Eliquis   Essential hypertension Blood pressure normal - Continue Entresto , Farxiga , Lasix           Subjective: Patient was weaned off oxygen overnight, she ambulated on room air, did not desaturate, but remained short of breath.  We will continue Lasix  here, ask for physical therapy evaluation     Physical Exam: BP (!) 125/57 (BP Location: Right Arm)   Pulse  (!) 59   Temp 98 F (36.7 C) (Oral)   Resp 16   Ht 5' 6 (1.676 m)   Wt 126.1 kg   SpO2 95%   BMI 44.87 kg/m   Obese adult female, lying in bed, appears weak and tired Rate normal, lungs clear without rales or wheezes, increased respiratory effort noted with exertion Abdomen soft, no tenderness palpation or guarding, no ascites or distention Attention normal, affect anxious, judgment and insight appear normal, oriented x 3, upper and lower extremity strength normal    Data Reviewed: Basic metabolic panel shows normal electrolytes and renal function CBC shows mild stable anemia    Family Communication:     Disposition: Status is: Observation Patient has been weaned off of oxygen, she is on oral agents, given her persistent shortness of breath, we will observe 24 more hours        Author: Lonni SHAUNNA Dalton, MD 09/26/2024 12:47 PM  For on call review [email or web address].    "

## 2024-09-26 NOTE — Assessment & Plan Note (Signed)
 -  CPAP at night

## 2024-09-26 NOTE — Progress Notes (Signed)
" °   09/26/24 0124  BiPAP/CPAP/SIPAP  $ Non-Invasive Home Ventilator  Initial  BiPAP/CPAP/SIPAP Pt Type Adult  BiPAP/CPAP/SIPAP SERVO  Mask Type Full face mask  Dentures removed? Not applicable  Mask Size Medium  Respiratory Rate 18 breaths/min  Flow Rate 4 lpm  Patient Home Machine No  Patient Home Mask No  Patient Home Tubing No  Auto Titrate Yes  Minimum cmH2O 5 cmH2O  Maximum cmH2O 15 cmH2O  Device Plugged into RED Power Outlet Yes    "

## 2024-09-26 NOTE — Hospital Course (Signed)
 74 y.o. F with obesity, pAF on Eliquis , dCHF, and recently admitted for influenza presented with SOB.  Seen in office day of admission, ambulated on room air with normal SpO2.  Went home and felt SOB so returned to the ER.  In the ER, CXR interpreted as edema.  BNP similar to previous.   Started on Lasix  and admitted.

## 2024-09-26 NOTE — Assessment & Plan Note (Signed)
 Blood pressure normal - Continue Entresto , Farxiga , Lasix 

## 2024-09-26 NOTE — Assessment & Plan Note (Signed)
 CKD stage IIIa, creatinine stable relative to baseline

## 2024-09-26 NOTE — Progress Notes (Signed)
" °   09/26/24 2249  BiPAP/CPAP/SIPAP  BiPAP/CPAP/SIPAP Pt Type Adult  BiPAP/CPAP/SIPAP DREAMSTATIOND  Mask Type Nasal mask  Mask Size Small  FiO2 (%) 21 %  Patient Home Machine No  Patient Home Mask No  Patient Home Tubing No  Auto Titrate Yes  Minimum cmH2O 8 cmH2O  Maximum cmH2O 20 cmH2O  Device Plugged into RED Power Outlet Yes    "

## 2024-09-26 NOTE — Assessment & Plan Note (Signed)
-   Continue home tramadol , gabapentin , Prozac , Xanax

## 2024-09-26 NOTE — Assessment & Plan Note (Addendum)
 Hemoglobin stable relative to recent baseline, no bleeding reported or observed

## 2024-09-26 NOTE — Progress Notes (Signed)
 Heart Failure Navigator Progress Note  Assessed for Heart & Vascular TOC clinic readiness.  Patient does not meet criteria due to she is an Advanced heart Failure Team patient of Dr. Zenaida. .   Navigator will sign off at this time.   Stephane Haddock, BSN, Scientist, Clinical (histocompatibility And Immunogenetics) Only

## 2024-09-26 NOTE — Assessment & Plan Note (Signed)
 In sinus rhythm. - Continue amiodarone , Eliquis 

## 2024-09-26 NOTE — Assessment & Plan Note (Signed)
 BMI 44.8, complicates care

## 2024-09-26 NOTE — Progress Notes (Signed)
" °   09/26/24 2249  BiPAP/CPAP/SIPAP  $ Face Mask Medium Yes  BiPAP/CPAP/SIPAP Pt Type Adult  BiPAP/CPAP/SIPAP DREAMSTATIOND  Mask Size Medium  Respiratory Rate 20 breaths/min  FiO2 (%) 21 %  Patient Home Machine No  Patient Home Mask No  Patient Home Tubing No  Auto Titrate Yes  Minimum cmH2O 8 cmH2O  Maximum cmH2O 20 cmH2O  Device Plugged into RED Power Outlet Yes    "

## 2024-09-26 NOTE — Assessment & Plan Note (Signed)
-   Continue Xanax , Prozac

## 2024-09-26 NOTE — Assessment & Plan Note (Signed)
 Continue Eliquis 

## 2024-09-26 NOTE — Assessment & Plan Note (Signed)
 Presented with shortness of breath on exertion, chest x-ray showing bilateral infiltrates, elevated proBNP.  Diuresed overnight and weaned off of oxygen. - Continue home Entresto  - Transition back to home Lasix  - Resume home Farxiga 

## 2024-09-27 DIAGNOSIS — I5033 Acute on chronic diastolic (congestive) heart failure: Secondary | ICD-10-CM | POA: Diagnosis not present

## 2024-09-27 LAB — COMPREHENSIVE METABOLIC PANEL WITH GFR
ALT: 28 U/L (ref 0–44)
AST: 34 U/L (ref 15–41)
Albumin: 3.5 g/dL (ref 3.5–5.0)
Alkaline Phosphatase: 78 U/L (ref 38–126)
Anion gap: 8 (ref 5–15)
BUN: 13 mg/dL (ref 8–23)
CO2: 32 mmol/L (ref 22–32)
Calcium: 9 mg/dL (ref 8.9–10.3)
Chloride: 100 mmol/L (ref 98–111)
Creatinine, Ser: 1.13 mg/dL — ABNORMAL HIGH (ref 0.44–1.00)
GFR, Estimated: 51 mL/min — ABNORMAL LOW
Glucose, Bld: 92 mg/dL (ref 70–99)
Potassium: 3.5 mmol/L (ref 3.5–5.1)
Sodium: 141 mmol/L (ref 135–145)
Total Bilirubin: 0.5 mg/dL (ref 0.0–1.2)
Total Protein: 6 g/dL — ABNORMAL LOW (ref 6.5–8.1)

## 2024-09-27 LAB — CBC
HCT: 31.5 % — ABNORMAL LOW (ref 36.0–46.0)
Hemoglobin: 9.7 g/dL — ABNORMAL LOW (ref 12.0–15.0)
MCH: 26 pg (ref 26.0–34.0)
MCHC: 30.8 g/dL (ref 30.0–36.0)
MCV: 84.5 fL (ref 80.0–100.0)
Platelets: 245 K/uL (ref 150–400)
RBC: 3.73 MIL/uL — ABNORMAL LOW (ref 3.87–5.11)
RDW: 16.7 % — ABNORMAL HIGH (ref 11.5–15.5)
WBC: 4 K/uL (ref 4.0–10.5)
nRBC: 0 % (ref 0.0–0.2)

## 2024-09-27 NOTE — Discharge Summary (Signed)
 " Physician Discharge Summary   Patient: Kathy [name] MRN: [id number] DOB: 04-07-1951  Admit date:     09/25/2024  Discharge date: 09/27/2024  Discharge Physician: Lonni SHAUNNA Dalton   PCP: Theophilus Andrews, Tully GRADE, MD     Recommendations at discharge:  Follow up with CHF clinic within 1 week Jessica Millbanks: Please obtain BMP on Thursday If dyspnea on exertion does not change with Lasix  this week, recommend PFTs and pulmonology referral (amiodarone  lung?)     Discharge Diagnoses: Principal Problem:   Acute on chronic diastolic (congestive) heart failure (HCC) Active Problems:   CKD (chronic kidney disease) stage 3, GFR 30-59 ml/min (HCC)   Morbid obesity (HCC)   Paroxysmal atrial fibrillation (HCC)   OSA on CPAP   Chronic pain disorder   Essential hypertension   Personal history of DVT (deep vein thrombosis)   Anemia, unspecified   Anxiety and depression      Hospital Course: 74 y.o. F with obesity, pAF on Eliquis , dCHF, and recently admitted for influenza presented with SOB.  Seen in office day of admission, ambulated on room air with normal SpO2.  Went home and felt SOB so returned to the ER.  In the ER, CXR showed edema, proBNP elevated.  Started on Lasix  and admitted.      * Acute on chronic diastolic (congestive) heart failure (HCC) Presented with shortness of breath on exertion, chest x-ray showing bilateral infiltrates, elevated proBNP.    Diuresed and weaned to room air.  Able to ambulate with PT without assistance, SpO2 >90% with ambulation.  No swelling or orthopnea.  Takes Furosemide  PRN at home.  Recommend Furosemide  40 mg once daily for 3 more days then BMP on Thursday and Cardiology appointment this week.  On Entresto . Not yet started Farxiga .    CKD (chronic kidney disease) stage 3, GFR 30-59 ml/min (HCC) CKD stage IIIa, creatinine stable relative to baseline  Chronic pain disorder Stable tramadol , gabapentin , Prozac , Xanax   OSA on  CPAP Stable on CPAP   Paroxysmal atrial fibrillation (HCC) In sinus rhythm here.  On amiodarone  and Eliquis .     Morbid obesity (HCC) BMI 44.8, complicates care  Anxiety and depression Continue Xanax , Prozac   Anemia, unspecified Hemoglobin stable relative to recent baseline, no bleeding reported or observed             The [place]  Controlled Substances Registry was reviewed for this patient prior to discharge.   Consultants: None Procedures performed: None  Disposition: Home Diet recommendation:  Cardiac diet  DISCHARGE MEDICATION: Allergies as of 09/27/2024       Reactions   Achromycin [tetracycline Hcl] Shortness Of Breath   Dyspnea   Penicillins    Facial angioedema Did it involve swelling of the face/tongue/throat, SOB, or low BP? Yes Did it involve sudden or severe rash/hives, skin peeling, or any reaction on the inside of your mouth or nose? No Did you need to seek medical attention at a hospital or doctor's office? No When did it last happen?      30+ years If all above answers are NO, may proceed with cephalosporin use.   Sulfamethoxazole-trimethoprim Hives   Nickel Itching   Oxycodone  Itching   Zanaflex [tizanidine Hydrochloride] Other (See Comments)   Causes more pain         Medication List     PAUSE taking these medications    chlorpheniramine-HYDROcodone  10-8 MG/5ML Wait to take this until your doctor or other care provider tells you to start  again. Commonly known as: TUSSIONEX Take 5 mLs by mouth at bedtime as needed.       TAKE these medications    albuterol  108 (90 Base) MCG/ACT inhaler Commonly known as: VENTOLIN  HFA Inhale 2 puffs into the lungs every 6 (six) hours as needed for wheezing or shortness of breath.   ALPRAZolam  1 MG tablet Commonly known as: XANAX  TAKE 1 TABLET BY MOUTH THREE TIMES A DAY AS NEEDED FOR ANXIETY   amiodarone  100 MG tablet Commonly known as: PACERONE  Take 1 tablet (100 mg total) by  mouth daily.   apixaban  5 MG Tabs tablet Commonly known as: Eliquis  Take 1 tablet (5 mg total) by mouth 2 (two) times daily.   atorvastatin  40 MG tablet Commonly known as: LIPITOR Take 1 tablet (40 mg total) by mouth daily.   CALCIUM  600 + D PO Take 600 mg by mouth daily.   cetirizine  10 MG tablet Commonly known as: ZYRTEC  TAKE 1 TABLET BY MOUTH EVERY DAY   clindamycin 1 % lotion Commonly known as: CLEOCIN T Apply 1 applicator topically 2 (two) times daily as needed.   dapagliflozin  propanediol 10 MG Tabs tablet Commonly known as: FARXIGA  Take 1 tablet (10 mg total) by mouth daily.   dextromethorphan -guaiFENesin  30-600 MG 12hr tablet Commonly known as: MUCINEX  DM Take 1 tablet by mouth 2 (two) times daily.   Entresto  97-103 MG Generic drug: sacubitril -valsartan  Take 1 tablet by mouth 2 (two) times daily.   fluocinonide 0.05 % external solution Commonly known as: LIDEX Apply 1 application topically daily as needed (rosacea).   FLUoxetine  40 MG capsule Commonly known as: PROZAC  TAKE 1 CAPSULE BY MOUTH EVERY DAY   fluticasone  50 MCG/ACT nasal spray Commonly known as: FLONASE  SPRAY 2 SPRAYS INTO EACH NOSTRIL EVERY DAY   furosemide  40 MG tablet Commonly known as: LASIX  Take 1 tablet (40 mg total) by mouth as needed. For weight gain of 3 lbs in 24 hours or 5 lbs in a week   gabapentin  300 MG capsule Commonly known as: NEURONTIN  TAKE 1 CAPSULE BY MOUTH EVERYDAY AT BEDTIME   ipratropium-albuterol  0.5-2.5 (3) MG/3ML Soln Commonly known as: DUONEB Take 3 mLs by nebulization 2 (two) times daily.   methocarbamol  750 MG tablet Commonly known as: ROBAXIN  Take 750 mg by mouth 3 (three) times daily as needed for muscle spasms.   metroNIDAZOLE  0.75 % cream Commonly known as: METROCREAM  Apply 1 application topically 2 (two) times daily as needed (rosacea).   multivitamin with minerals Tabs tablet Take 1 tablet by mouth daily. Centrum Silver   nitroGLYCERIN  0.4 MG SL  tablet Commonly known as: NITROSTAT  Place 1 tablet (0.4 mg total) under the tongue every 5 (five) minutes as needed for chest pain.   omeprazole  40 MG capsule Commonly known as: PRILOSEC TAKE 1 CAPSULE BY MOUTH EVERY DAY   potassium chloride  10 MEQ tablet Commonly known as: KLOR-CON  Take 2 tablets (20 mEq total) by mouth as needed. For weight gain of 3 lbs in 24 hours or 5 lbs in a week   Systane Balance 0.6 % Soln Generic drug: Propylene Glycol Place 1 drop into both eyes in the morning and at bedtime.   traMADol  50 MG tablet Commonly known as: ULTRAM  Take 50 mg by mouth every 6 (six) hours as needed.        Follow-up Information     Racetrack, Harlene HERO, FNP. Schedule an appointment as soon as possible for a visit in 1 week(s).   Specialty: Cardiology Contact  information: [address] [place] [address] [phone]                 Discharge Instructions     Discharge instructions   Complete by: As directed    **IMPORTANT DISCHARGE INSTRUCTIONS**   From Dr. Jonel: You were admitted for trouble breathing. Here we found that your x-ray showed some pulmonary edema (fluids backed up into the airspaces of the lungs) and so you were treated with Lasix .  You should take furosemide  40 mg once daily for 3 more days, (Sun, Mon Tues) then go back to as needed use.  Continue to weigh yourself every day  Get your kidney function checked on Thursday at the Cardiology office (if not there, call Dr. Dennard office)  I will send a message to Cardiology to ask them to schedule you a follow up this week  Use your pulse oximeter. If you feel out of breath, check your oxygen level.  If the level is 88% or above, you have time, take deep breaths, use the nebulizer, rest until your breathing is better. If the level is LESS than 88% and doesn't go above within a short time (few minutes), then it would be wise to seek care.  You may use albuterol  in the  nebulizer if this is helping you  Tell the Cardiology office how often you are using it, because this will help them get a clearer picture of how you are doing  You *may* use Mucinex  or the Tussionex (hydrocodone -chlorphinamine) but just beware, these both will cause dizziness, nausea or lightheadedness in many people  Take it easy Resume your other home medicines without change   Increase activity slowly   Complete by: As directed        Discharge Exam: Filed Weights   09/25/24 2059  Weight: 126.1 kg    General: Pt is alert, awake, not in acute distress Cardiovascular: RRR, nl S1-S2, soft systolic murmur appreciated.   No LE edema.  JVP not visible. Respiratory: Normal respiratory rate and rhythm.  Overall dimished.  CTAB without rales or wheezes. Neuro/Psych: Strength symmetric in upper and lower extremities.  Judgment and insight appear normal.   Condition at discharge: good  The results of significant diagnostics from this hospitalization (including imaging, microbiology, ancillary and laboratory) are listed below for reference.   Imaging Studies: DG Chest 2 View Result Date: 09/25/2024 EXAM: 2 VIEW(S) XRAY OF THE CHEST 09/09/2024 04:29:45 PM COMPARISON: 07/31/2024 CLINICAL HISTORY: Persistent cough. CXR negative in 07/2024. FINDINGS: LUNGS AND PLEURA: Mild prominence of the central pulmonary vasculature without pulmonary edema. Mild chronic interstitial prominence again noted. No lobar consolidation. Prominent right hilum, possibly representing prominent right pulmonary artery or hilar adenopathy. No pleural effusion. No pneumothorax. HEART AND MEDIASTINUM: Similar appearance of the cardiomediastinal silhouette. Aortic arch calcifications. BONES AND SOFT TISSUES: Moderate disc changes of thoracic spine. 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 IMPRESSION: 1. No acute cardiopulmonary abnormality. 2. Prominent right hilum, which may represent a prominent  right pulmonary artery versus hilar adenopathy. 3. Mild chronic interstitial prominence. Electronically signed by: Donnice Mania MD MD 09/25/2024 09:52 PM EST RP Workstation: [id number]   DG Chest Port 1 View Result Date: 09/25/2024 EXAM: 1 VIEW(S) XRAY OF THE CHEST 09/25/2024 09:24:00 PM COMPARISON: 09/10/2024. CLINICAL HISTORY: sob sob FINDINGS: LUNGS AND PLEURA: Vascular congestion with perihilar and interstitial opacities, possibly interstitial edema. No pleural effusion. No pneumothorax. HEART AND MEDIASTINUM: No acute abnormality of the cardiac and mediastinal silhouettes.  BONES AND SOFT TISSUES: No acute osseous abnormality. IMPRESSION: 1. Pulmonary vascular congestion with perihilar and interstitial opacities, consistent with interstitial pulmonary edema. Electronically signed by: Franky Crease MD MD 09/25/2024 09:28 PM EST RP Workstation: [id number]   DG Chest Portable 1 View Result Date: 09/10/2024 CLINICAL DATA:  Shortness of breath EXAM: PORTABLE CHEST 1 VIEW COMPARISON:  Yesterday FINDINGS: Stable cardiomediastinal silhouette. No acute pulmonary disease is noted. Bony thorax is unremarkable. IMPRESSION: No acute abnormality seen. Electronically Signed   By: Lynwood Landy Raddle M.D.   On: 09/10/2024 15:05    Microbiology: Results for orders placed or performed during the hospital encounter of 09/25/24  Resp panel by RT-PCR (RSV, Flu A&B, Covid) Anterior Nasal Swab     Status: None   Collection Time: 09/25/24  9:44 PM   Specimen: Anterior Nasal Swab  Result Value Ref Range Status   SARS Coronavirus 2 by RT PCR NEGATIVE NEGATIVE Final    Comment: (NOTE) SARS-CoV-2 target nucleic acids are NOT DETECTED.  The SARS-CoV-2 RNA is generally detectable in upper respiratory specimens during the acute phase of infection. The lowest concentration of SARS-CoV-2 viral copies this assay can detect is 138 copies/mL. A negative result does not preclude SARS-Cov-2 infection and should not be used as the  sole basis for treatment or other patient management decisions. A negative result may occur with  improper specimen collection/handling, submission of specimen other than nasopharyngeal swab, presence of viral mutation(s) within the areas targeted by this assay, and inadequate number of viral copies(<138 copies/mL). A negative result must be combined with clinical observations, patient history, and epidemiological information. The expected result is Negative.  Fact Sheet for Patients:  bloggercourse.com  Fact Sheet for Healthcare Providers:  seriousbroker.it  This test is no t yet approved or cleared by the [place]  FDA and  has been authorized for detection and/or diagnosis of SARS-CoV-2 by FDA under an Emergency Use Authorization (EUA). This EUA will remain  in effect (meaning this test can be used) for the duration of the COVID-19 declaration under Section 564(b)(1) of the Act, 21 U.S.C.section 360bbb-3(b)(1), unless the authorization is terminated  or revoked sooner.       Influenza A by PCR NEGATIVE NEGATIVE Final   Influenza B by PCR NEGATIVE NEGATIVE Final    Comment: (NOTE) The Xpert Xpress SARS-CoV-2/FLU/RSV plus assay is intended as an aid in the diagnosis of influenza from Nasopharyngeal swab specimens and should not be used as a sole basis for treatment. Nasal washings and aspirates are unacceptable for Xpert Xpress SARS-CoV-2/FLU/RSV testing.  Fact Sheet for Patients: bloggercourse.com  Fact Sheet for Healthcare Providers: seriousbroker.it  This test is not yet approved or cleared by the [place]  FDA and has been authorized for detection and/or diagnosis of SARS-CoV-2 by FDA under an Emergency Use Authorization (EUA). This EUA will remain in effect (meaning this test can be used) for the duration of the COVID-19 declaration under Section 564(b)(1) of the  Act, 21 U.S.C. section 360bbb-3(b)(1), unless the authorization is terminated or revoked.     Resp Syncytial Virus by PCR NEGATIVE NEGATIVE Final    Comment: (NOTE) Fact Sheet for Patients: bloggercourse.com  Fact Sheet for Healthcare Providers: seriousbroker.it  This test is not yet approved or cleared by the [place]  FDA and has been authorized for detection and/or diagnosis of SARS-CoV-2 by FDA under an Emergency Use Authorization (EUA). This EUA will remain in effect (meaning this test can be used) for the duration of the COVID-19 declaration  under Section 564(b)(1) of the Act, 21 U.S.C. section 360bbb-3(b)(1), unless the authorization is terminated or revoked.  Performed at [facility], [address] [place], [place] [address]     Labs: CBC: Recent Labs  Lab 09/25/24 2143 09/26/24 0549 09/27/24 0342  WBC 8.9 5.1 4.0  NEUTROABS 7.3  --   --   HGB 10.4* 9.6* 9.7*  HCT 34.0* 31.3* 31.5*  MCV 85.0 86.2 84.5  PLT 276 244 245   Basic Metabolic Panel: Recent Labs  Lab 09/25/24 2143 09/26/24 0549 09/27/24 0342  NA 138 141 141  K 3.5 3.5 3.5  CL 102 102 100  CO2 26 30 32  GLUCOSE 91 97 92  BUN 13 13 13   CREATININE 0.99 1.08* 1.13*  CALCIUM  9.1 8.8* 9.0   Liver Function Tests: Recent Labs  Lab 09/25/24 2143 09/27/24 0342  AST 30 34  ALT 30 28  ALKPHOS 93 78  BILITOT 0.6 0.5  PROT 6.5 6.0*  ALBUMIN  3.7 3.5   CBG: No results for input(s): GLUCAP in the last 168 hours.  Discharge time spent: approximately 45 minutes spent on discharge counseling, evaluation of patient on day of discharge, and coordination of discharge planning with nursing, social work, pharmacy and case management  Signed: Lonni SHAUNNA Dalton, MD [facility] 09/27/2024         "

## 2024-09-27 NOTE — Progress Notes (Signed)
 SATURATION QUALIFICATIONS: (This note is used to comply with regulatory documentation for home oxygen)  Patient Saturations on Room Air at Rest = 95%  Patient Saturations on Room Air while Ambulating = 91%  Patient Saturations on  Liters of oxygen while Ambulating = %na  Please briefly explain why patient needs home oxygen:Patient does not require supplemental oxygen for ADL's and ambulation. Darice Potters PT Acute Rehabilitation Services Office [phone]

## 2024-09-27 NOTE — Evaluation (Signed)
 Physical Therapy Evaluation Patient Details Name: Kathy [name] MRN: [id number] DOB: 08/13/1951 Today's Date: 09/27/2024  History of Present Illness  Patient is a 74y.o. presented to ED on 09/25/24  with complaints on ongoing cough, weakness, and fatigue CHF. Positive for influenza A in 12/25.SABRA PMH includes: chronic CHF, paroxysmal afib, HTN, pulmonary HTN, CKD stage 3, anxiety/depression, GERD, obesity.  Clinical Impression  Pt admitted with above diagnosis.  Pt currently with functional limitations due to the deficits listed below (see PT Problem List). Pt will benefit from acute skilled PT to increase their independence and safety with mobility to allow discharge.     The patient is eager to ambulate. Patient ambulated on RA x 50' using her cane. The patient is hopeful to Dc.  See saturation qualification walk test in separate note.  No  follow up PT after Dc. PT will follow while in acute care.      If plan is discharge home, recommend the following: Assist for transportation;Assistance with cooking/housework   Can travel by private vehicle        Equipment Recommendations None recommended by PT  Recommendations for Other Services       Functional Status Assessment Patient has had a recent decline in their functional status and demonstrates the ability to make significant improvements in function in a reasonable and predictable amount of time.     Precautions / Restrictions Precautions Precaution/Restrictions Comments: monitor sats      Mobility  Bed Mobility Overal bed mobility: Modified Independent                  Transfers Overall transfer level: Needs assistance Equipment used: Straight cane Transfers: Sit to/from Stand Sit to Stand: Supervision                Ambulation/Gait Ambulation/Gait assistance: Contact guard assist Gait Distance (Feet): 50 Feet (and 20') Assistive device: Straight cane Gait Pattern/deviations: Step-through  pattern Gait velocity: decreased Gait velocity interpretation: <1.31 ft/sec, indicative of household ambulator   General Gait Details: gait is slow  and guarded to ambulate  Stairs            Wheelchair Mobility     Tilt Bed    Modified Rankin (Stroke Patients Only)       Balance Overall balance assessment: Mild deficits observed, not formally tested                                           Pertinent Vitals/Pain Pain Assessment Pain Assessment: No/denies pain    Home Living Family/patient expects to be discharged to:: Private residence Living Arrangements: Alone Available Help at Discharge: Family;Friend(s) Type of Home: House Home Access: Stairs to enter Entrance Stairs-Rails: Right Entrance Stairs-Number of Steps: 2 Alternate Level Stairs-Number of Steps: flight Home Layout: Two level;Able to live on main level with bedroom/bathroom Home Equipment: Rolling Walker (2 wheels);Rollator (4 wheels);Cane - single point;Hand held shower head;Grab bars - toilet;Lift chair;Other (comment) Additional Comments: son and  dtr in law availble    Prior Function Prior Level of Function : Independent/Modified Independent;Driving;History of Falls (last six months)             Mobility Comments: able to ambulate without use of DME household distances, recently bought rollator to have just in case; uses [facility] for community distances, has not driven since  flu 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, 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; groceries delivered     Extremity/Trunk Assessment   Upper Extremity Assessment Upper Extremity Assessment: Generalized weakness    Lower Extremity Assessment Lower Extremity Assessment: Generalized weakness    Cervical / Trunk Assessment Cervical / Trunk Assessment: Normal  Communication   Communication Communication: No apparent difficulties    Cognition Arousal: Alert Behavior During Therapy: WFL for tasks  assessed/performed                             Following commands: Intact       Cueing       General Comments      Exercises     Assessment/Plan    PT Assessment Patient needs continued PT services  PT Problem List Decreased strength;Decreased activity tolerance;Decreased balance       PT Treatment Interventions Functional mobility training;Therapeutic exercise;Balance training;Gait training;Stair training    PT Goals (Current goals can be found in the Care Plan section)  Acute Rehab PT Goals Patient Stated Goal: get back home to my dog PT Goal Formulation: With patient Time For Goal Achievement: 10/11/24 Potential to Achieve Goals: Good    Frequency Min 2X/week     Co-evaluation               AM-PAC PT 6 Clicks Mobility  Outcome Measure Help needed turning from your back to your side while in a flat bed without using bedrails?: None Help needed moving from lying on your back to sitting on the side of a flat bed without using bedrails?: None Help needed moving to and from a bed to a chair (including a wheelchair)?: A Little Help needed standing up from a chair using your arms (e.g., wheelchair or bedside chair)?: A Little Help needed to walk in hospital room?: A Little Help needed climbing 3-5 steps with a railing? : A Little 6 Click Score: 20    End of Session Equipment Utilized During Treatment: Gait belt Activity Tolerance: Patient tolerated treatment well Patient left: in chair;with call bell/phone within reach Nurse Communication: Mobility status;Other (comment) PT Visit Diagnosis: Muscle weakness (generalized) (M62.81);Unsteadiness on feet (R26.81)    Time: 8980-8944 PT Time Calculation (min) (ACUTE ONLY): 36 min   Charges:   PT Evaluation $PT Eval Low Complexity: 1 Low PT Treatments $Gait Training: 8-22 mins PT General Charges $$ ACUTE PT VISIT: 1 Visit         Darice Potters PT Acute Rehabilitation Services Office  [phone]   Potters Darice Norris 09/27/2024, 2:02 PM

## 2024-09-27 NOTE — Progress Notes (Signed)
" °  Progress Note   Patient: Kathy [name] MRN:[id number] DOB: 13-Oct-1950 DOA: 09/25/2024     0 DOS: the patient was seen and examined on 09/27/2024        Brief hospital course: 74 y.o. F with obesity, pAF on Eliquis , dCHF, and recently admitted for influenza presented with SOB.  Seen in office day of admission, ambulated on room air with normal SpO2.  Went home and felt SOB so returned to the ER.  In the ER, CXR showed edema, proBNP elevated.  Started on Lasix  and admitted.     Assessment and Plan: * Acute on chronic diastolic (congestive) heart failure (HCC) Weaned to room air, still with chest tightness and shortness of breath.  No orthopnea.  No swelling. - Continue oral Lasix  daily - Continue Farxiga  and Entresto     CKD (chronic kidney disease) stage 3, GFR 30-59 ml/min (HCC) CKD stage IIIa, creatinine stable relative to baseline  Chronic pain disorder - Continue home tramadol , gabapentin , Prozac , Xanax   OSA on CPAP - CPAP at night  Paroxysmal atrial fibrillation (HCC) In sinus rhythm. - Continue amiodarone , Eliquis   Morbid obesity (HCC) BMI 44.8, complicates care  Anxiety and depression - Continue Xanax , Prozac   Anemia, unspecified Hemoglobin stable relative to recent baseline, no bleeding reported or observed  Personal history of DVT (deep vein thrombosis) - Continue Eliquis   Essential hypertension Blood pressure normal - Continue Entresto , Farxiga , Lasix           Subjective: Feels some burning in the chest, also some chest tightness and also some left upper quadrant abdominal pain, and a lot of postnasal drip and throat congestion.  Overall feels congested.  No fever, no confusion, no respiratory distress.     Physical Exam: BP (!) 130/57 (BP Location: Right Arm)   Pulse (!) 57   Temp 98.6 F (37 C) (Oral)   Resp 16   Ht 5' 6 (1.676 m)   Wt 126.1 kg   SpO2 96%   BMI 44.87 kg/m   Female, sitting on the edge of the bed, eating  breakfast RRR, soft S4, no peripheral pitting edema, JVP not visible due to body habitus Respiratory rate normal, air movement diminished due to body habitus, but no rales or wheezes appreciated in all lung fields Attention normal, affect normal, judgment and insight appear normal, oriented x 3, face symmetric, speech fluent, moves upper and lower extremities with normal coordination    Data Reviewed: Basic metabolic panel shows normal renal function with normal potassium CBC shows stable anemia no leukocytosis D-dimer negative      Family Communication: Son by phone    Disposition: Status is: Observation         Author: Lonni SHAUNNA Dalton, MD 09/27/2024 8:16 AM  For on call review [email or web address].    "

## 2024-09-29 ENCOUNTER — Telehealth (HOSPITAL_COMMUNITY): Payer: Self-pay | Admitting: Family Medicine

## 2024-09-29 NOTE — Telephone Encounter (Signed)
 Fyi - spoke to the son and he stated that he would like to discuss his mother's health with Dr Theophilus.  A virtual visit with the patient and the son was offered.  He declined. He will call back and schedule an office visit where both the patient and he will be able to come in.

## 2024-10-01 ENCOUNTER — Telehealth (HOSPITAL_COMMUNITY): Payer: Self-pay

## 2024-10-01 ENCOUNTER — Other Ambulatory Visit: Payer: Self-pay | Admitting: Internal Medicine

## 2024-10-01 DIAGNOSIS — J302 Other seasonal allergic rhinitis: Secondary | ICD-10-CM

## 2024-10-01 NOTE — Telephone Encounter (Signed)
 Called to confirm/remind patient of their appointment at [facility] on 10/02/24.   Appointment:   [] Confirmed  [x] Left mess   [] No answer/No voice mail  [] VM Full/unable to leave message  [] Phone not in service  And to bring in all medications and/or complete list.

## 2024-10-01 NOTE — Progress Notes (Addendum)
 "  [facility] NOTE  Primary Care: Theophilus Andrews, Tully GRADE, MD Primary Cardiologist: Dr. Mona  HF Cardiologist: assign to Dr. Zenaida  HPI: Kathy [name] is a 74 y.o. female with HFpEF, paroxysmal atrial fibrillation, OSA (CPAP), hyperlipidemia and HTN.  Kathy [name] cardiac history dates back to 2019 when she was admitted with AF, echo showed preserved LV function and severe LVH. She converted to NSR with metoprolol . She underwent LHC w/ nonobstructive CAD.   Admitted 11/23 with acute on chronic HFpEF; echo at that time with dilated RV w/ moderately reduced systolic function. She was diuresed and discharged home.  RHC confirmed RV dysfunction with reduced cardiac output/index likely secondary to long term OSA and severe TR. In addition, she has had afib for a prolonged period; underwent TEE/DCCV with conversion to sinus rhythm. cMRI in April 2024 demonstrated preserved LV function, severely dilated RV with RVEF 58%, D-shaped interventricular septum suggesting degree of RV pressure/volume overload, moderate to severe TR.  Echo 08/12/24, EF stable 60-65%, normal RV, no obvious TR  Admitted 12/25 with acute hypoxic respiratory failure 2/2 influenza A infection.  GDMT initially held then resumed prior to discharge. She was discharged home after a few days but later readmitted 09/25/24 with acute on chronic HFpEF. Had not yet started Farxiga  and was off lasix . She diuresed with IV lasix  and oxygen was weaned off.  Here today for post hospital CHF follow-up. Accompanied by her daughter-in-law who assists with the history. Slowly recovering from recent admissions. Gets winded with activity which she has attributed to recent illnesses and decreased mobility. Her home weight has been stable within a pound. Has been taking lasix  with potassium every day since discharge. No orthopnea or PND but does have cough which has been slow to resolve. Using CPAP nightly. Taking all  medications except for Farxiga . Plans to start that once she has had more time to recover.   Current Outpatient Medications  Medication Sig Dispense Refill   albuterol  (VENTOLIN  HFA) 108 (90 Base) MCG/ACT inhaler Inhale 2 puffs into the lungs every 6 (six) hours as needed for wheezing or shortness of breath. 8 g 0   ALPRAZolam  (XANAX ) 1 MG tablet TAKE 1 TABLET BY MOUTH THREE TIMES A DAY AS NEEDED FOR ANXIETY 90 tablet 0   amiodarone  (PACERONE ) 100 MG tablet Take 1 tablet (100 mg total) by mouth daily. 90 tablet 3   apixaban  (ELIQUIS ) 5 MG TABS tablet Take 1 tablet (5 mg total) by mouth 2 (two) times daily. 60 tablet 5   atorvastatin  (LIPITOR) 40 MG tablet Take 1 tablet (40 mg total) by mouth daily. 90 tablet 3   Calcium  Carb-Cholecalciferol (CALCIUM  600 + D PO) Take 600 mg by mouth daily.     cetirizine  (ZYRTEC ) 10 MG tablet TAKE 1 TABLET BY MOUTH EVERY DAY 90 tablet 1   [Paused] chlorpheniramine-HYDROcodone  (TUSSIONEX) 10-8 MG/5ML Take 5 mLs by mouth at bedtime as needed. 70 mL 0   clindamycin (CLEOCIN T) 1 % lotion Apply 1 applicator topically 2 (two) times daily as needed.     Dextromethorphan -guaiFENesin  (MUCINEX  DM PO) Take 1 tablet by mouth as needed.     fluocinonide (LIDEX) 0.05 % external solution Apply 1 application topically daily as needed (rosacea).     FLUoxetine  (PROZAC ) 40 MG capsule TAKE 1 CAPSULE BY MOUTH EVERY DAY 90 capsule 1   fluticasone  (FLONASE ) 50 MCG/ACT nasal spray SPRAY 2 SPRAYS INTO EACH NOSTRIL EVERY DAY 48 mL 2   furosemide  (LASIX )  40 MG tablet Take 1 tablet (40 mg total) by mouth as needed. For weight gain of 3 lbs in 24 hours or 5 lbs in a week 90 tablet 1   gabapentin  (NEURONTIN ) 300 MG capsule TAKE 1 CAPSULE BY MOUTH EVERYDAY AT BEDTIME 90 capsule 1   ipratropium-albuterol  (DUONEB) 0.5-2.5 (3) MG/3ML SOLN Take 3 mLs by nebulization 2 (two) times daily. 360 mL 0   methocarbamol  (ROBAXIN ) 750 MG tablet Take 750 mg by mouth 3 (three) times daily as needed for  muscle spasms.     metroNIDAZOLE  (METROCREAM ) 0.75 % cream Apply 1 application topically 2 (two) times daily as needed (rosacea).      Multiple Vitamin (MULTIVITAMIN WITH MINERALS) TABS tablet Take 1 tablet by mouth daily. Centrum Silver     nitroGLYCERIN  (NITROSTAT ) 0.4 MG SL tablet Place 1 tablet (0.4 mg total) under the tongue every 5 (five) minutes as needed for chest pain. 90 tablet 3   omeprazole  (PRILOSEC) 40 MG capsule TAKE 1 CAPSULE BY MOUTH EVERY DAY 90 capsule 1   potassium chloride  (KLOR-CON ) 10 MEQ tablet Take 2 tablets (20 mEq total) by mouth as needed. For weight gain of 3 lbs in 24 hours or 5 lbs in a week 180 tablet 3   Propylene Glycol (SYSTANE BALANCE) 0.6 % SOLN Place 1 drop into both eyes in the morning and at bedtime.     sacubitril -valsartan  (ENTRESTO ) 97-103 MG Take 1 tablet by mouth 2 (two) times daily. 60 tablet 1   traMADol  (ULTRAM ) 50 MG tablet Take 50 mg by mouth every 6 (six) hours as needed.     dapagliflozin  propanediol (FARXIGA ) 10 MG TABS tablet Take 1 tablet (10 mg total) by mouth daily. (Patient not taking: Reported on 10/02/2024) 30 tablet 11   No current facility-administered medications for this encounter.   Wt Readings from Last 3 Encounters:  10/02/24 126.3 kg (278 lb 6.4 oz)  09/25/24 126.1 kg (278 lb)  09/25/24 128.1 kg (282 lb 8 oz)   BP 120/68   Pulse (!) 56   Wt 126.3 kg (278 lb 6.4 oz)   SpO2 94%   BMI 44.93 kg/m   PHYSICAL EXAM: General:  Well appearing. Ambulated into clinic. Cor: JVP difficult but does not appear elevated. Regular rate & rhythm. No murmurs. Lungs: clear Abdomen: soft, nondistended. Extremities: no edema Neuro: alert & orientedx3. Affect pleasant   DATA REVIEW  ECG:  08/12/24: SB 56 bpm  cMRI: 12/25/22: LVEF 69%, RVEF 58%, RV severely dilated, D-shaped IVS suggestive of RV pressure/volume overload, nonspecific LGE at basal to mid inferoseptal RV insertion site, moderate to severe TR with regurgitant fraction of  36% and regurgitant volume of 65 cc  ECHO: 07/27/22: LVEF 50-55%, RV is dilated with mild to moderately reduced systolic function. RA is severely dilated. There is severe eccentric TR.   07/2024: LVEF 60-65%, grade II DD, RV okay, trivial TR  CATH: 06/26/18:  Ost RCA lesion is 30% stenosed. The left ventricular systolic function is normal. LV end diastolic pressure is mildly elevated. The left ventricular ejection fraction is 55-65% by visual estimate.  10/10/22: RA:                  13 mmHg (mean) RV:                  41/10-15 mmHg PA:                  40/15 mmHg (21 mean) PCWP:  14 mmHg (mean)                                      Estimated Fick CO/CI   4.39 L/min, 1.9 L/min/m2 Thermodilution CO/CI   3.61 L/min, 1.5 L/min/m2                                            TPG                 7  mmHg                                              PVR                 1.6-1.94 Wood Units  PAPi                1.9       IMPRESSION: Severely elevated pre-capillary pressures.  Normal left sided filling pressures Normal PVR and PA mean.  Severely reduced cardiac index likely secondary to suboptimal RV function. (RCE:ERTE of 1, PAPi of 1.9. )   ASSESSMENT & PLAN:  Chronic RV Failure/Severe TR - Echocardiograms dating back to 2019 demonstrate severe TR. On her initial echo in 2019, she has severe TR with mildly dilated RV and preserved LV function. Over the past 3 years, echo's demonstrate gradual progression of TR and worsening of RV function.  Uncertain if this is primary TR, or a sequale of underlying pulmonary hypertension.  - RHC/TEE 1/24 confirmed RV failure with severe TR. Likely has had prolonged atrial fibrillation and OSA.  - Cardiac MRI 04/24 with RVEF 58%, severely dilated RV and evidence of moderate to severe TR. - Initially noted significant improvement in functional status with cardioversion from AF to NSR.  - See below regarding volume  Heart failure w/ preserved EF   - Echo 08/12/24: EF 60-65%, RV okay - NYHA III, confounded by recent viral illness and hospitalizations - Volume okay on exam. Not entirely certain if cough is residual from flu A or pulmonary congestion. Lungs clear on exam. Home weight stable. Continue taking lasix  40 mg daily + 20 mEQ KCL daily until she starts Farxiga , then cut back lasix  and KCL to PRN - Continue Entresto  97/103 mg BID - CMET/BNP today  Atrial fibrillation  - s/p TEE/DCCV in January 2024. - SB on ECG today - Continue amiodarone  100 mg daily. TSH okay 11/25, LFTs mildly elevated during recent admit but improved at discharge. Recheck LFTs today. - If continues to maintain SR hopefully can discontinue amiodarone  at some point. - Continue Eliquis  5 mg bid.   OSA  - AHI 20/hr with O2 sats as low as 79%.  - Continue to wear CPAP nightly  Obesity - Body mass index is 44.93 kg/m. - Consider GLP1 in the future  Follow up 2 months to establish with Dr. Zenaida Manuelita Dutch, PA-C 10/02/24  "

## 2024-10-02 ENCOUNTER — Encounter (HOSPITAL_COMMUNITY): Payer: Self-pay

## 2024-10-02 ENCOUNTER — Ambulatory Visit (HOSPITAL_COMMUNITY)
Admission: RE | Admit: 2024-10-02 | Discharge: 2024-10-02 | Disposition: A | Discharge status: Home / Self Care | Source: Ambulatory Visit | Attending: Physician Assistant | Admitting: Physician Assistant

## 2024-10-02 ENCOUNTER — Ambulatory Visit (HOSPITAL_COMMUNITY): Payer: Self-pay | Admitting: Physician Assistant

## 2024-10-02 VITALS — BP 120/68 | HR 56 | Wt 278.4 lb

## 2024-10-02 DIAGNOSIS — I5032 Chronic diastolic (congestive) heart failure: Secondary | ICD-10-CM | POA: Diagnosis not present

## 2024-10-02 DIAGNOSIS — I2510 Atherosclerotic heart disease of native coronary artery without angina pectoris: Secondary | ICD-10-CM | POA: Insufficient documentation

## 2024-10-02 DIAGNOSIS — Z79899 Other long term (current) drug therapy: Secondary | ICD-10-CM | POA: Diagnosis not present

## 2024-10-02 DIAGNOSIS — G4733 Obstructive sleep apnea (adult) (pediatric): Secondary | ICD-10-CM | POA: Diagnosis not present

## 2024-10-02 DIAGNOSIS — I110 Hypertensive heart disease with heart failure: Secondary | ICD-10-CM | POA: Diagnosis not present

## 2024-10-02 DIAGNOSIS — R001 Bradycardia, unspecified: Secondary | ICD-10-CM | POA: Insufficient documentation

## 2024-10-02 DIAGNOSIS — I071 Rheumatic tricuspid insufficiency: Secondary | ICD-10-CM | POA: Insufficient documentation

## 2024-10-02 DIAGNOSIS — I50810 Right heart failure, unspecified: Secondary | ICD-10-CM | POA: Diagnosis not present

## 2024-10-02 DIAGNOSIS — E669 Obesity, unspecified: Secondary | ICD-10-CM | POA: Insufficient documentation

## 2024-10-02 DIAGNOSIS — E785 Hyperlipidemia, unspecified: Secondary | ICD-10-CM | POA: Insufficient documentation

## 2024-10-02 DIAGNOSIS — Z7984 Long term (current) use of oral hypoglycemic drugs: Secondary | ICD-10-CM | POA: Insufficient documentation

## 2024-10-02 DIAGNOSIS — Z6841 Body Mass Index (BMI) 40.0 and over, adult: Secondary | ICD-10-CM | POA: Insufficient documentation

## 2024-10-02 DIAGNOSIS — Z7901 Long term (current) use of anticoagulants: Secondary | ICD-10-CM | POA: Diagnosis not present

## 2024-10-02 DIAGNOSIS — I480 Paroxysmal atrial fibrillation: Secondary | ICD-10-CM | POA: Diagnosis not present

## 2024-10-02 LAB — COMPREHENSIVE METABOLIC PANEL WITH GFR
ALT: 33 U/L (ref 0–44)
AST: 41 U/L (ref 15–41)
Albumin: 3.9 g/dL (ref 3.5–5.0)
Alkaline Phosphatase: 82 U/L (ref 38–126)
Anion gap: 10 (ref 5–15)
BUN: 22 mg/dL (ref 8–23)
CO2: 29 mmol/L (ref 22–32)
Calcium: 8.8 mg/dL — ABNORMAL LOW (ref 8.9–10.3)
Chloride: 99 mmol/L (ref 98–111)
Creatinine, Ser: 1.19 mg/dL — ABNORMAL HIGH (ref 0.44–1.00)
GFR, Estimated: 48 mL/min — ABNORMAL LOW
Glucose, Bld: 93 mg/dL (ref 70–99)
Potassium: 3.8 mmol/L (ref 3.5–5.1)
Sodium: 137 mmol/L (ref 135–145)
Total Bilirubin: 0.5 mg/dL (ref 0.0–1.2)
Total Protein: 6.6 g/dL (ref 6.5–8.1)

## 2024-10-02 LAB — PRO BRAIN NATRIURETIC PEPTIDE: Pro Brain Natriuretic Peptide: 362 pg/mL — ABNORMAL HIGH

## 2024-10-02 NOTE — Patient Instructions (Signed)
 Medication Changes:  Continue taking Furosemide  and Potassium daily until you start Farxiga , then decrease to only as needed  Lab Work:  Labs done today, your results will be available in MyChart, we will contact you for abnormal readings.   Special Instructions // Education:  Do the following things EVERYDAY: Weigh yourself in the morning before breakfast. Write it down and keep it in a log. Take your medicines as prescribed Eat low salt foods--Limit salt (sodium) to 2000 mg per day.  Stay as active as you can everyday Limit all fluids for the day to less than 2 liters   Follow-Up in: 2-3 months   At the [facility], you and your health needs are our priority. We have a designated team specialized in the treatment of Heart Failure. This Care Team includes your primary Heart Failure Specialized Cardiologist (physician), Advanced Practice Providers (APPs- Physician Assistants and Nurse Practitioners), and Pharmacist who all work together to provide you with the care you need, when you need it.   You may see any of the following providers on your designated Care Team at your next follow up:  Dr. Toribio Fuel Dr. Ezra Shuck Dr. Odis Brownie Greig Mosses, NP Caffie Shed, [place] [facility] [place], [place] Beckey Coe, NP Jordan Lee, NP Tinnie Redman, PharmD   Please be sure to bring in all your medications bottles to every appointment.   Need to Contact Us :  If you have any questions or concerns before your next appointment please send us  a message through [facility] or call our office at [phone].    TO LEAVE A MESSAGE FOR THE NURSE SELECT OPTION 2, PLEASE LEAVE A MESSAGE INCLUDING: YOUR NAME DATE OF BIRTH CALL BACK NUMBER REASON FOR CALL**this is important as we prioritize the call backs  YOU WILL RECEIVE A CALL BACK THE SAME DAY AS LONG AS YOU CALL BEFORE 4:00 PM

## 2024-10-10 ENCOUNTER — Other Ambulatory Visit (HOSPITAL_COMMUNITY): Payer: Self-pay | Admitting: Adult Health

## 2024-10-21 ENCOUNTER — Other Ambulatory Visit: Payer: Self-pay | Admitting: Internal Medicine

## 2024-10-21 DIAGNOSIS — F419 Anxiety disorder, unspecified: Secondary | ICD-10-CM

## 2024-12-30 ENCOUNTER — Ambulatory Visit (HOSPITAL_COMMUNITY): Admitting: Cardiology
